# Patient Record
Sex: Male | Born: 1955
Health system: Southern US, Community
[De-identification: ages and names within clinical notes are randomized; demographics above are authoritative.]

## PROBLEM LIST (undated history)

## (undated) DIAGNOSIS — J9621 Acute and chronic respiratory failure with hypoxia: Secondary | ICD-10-CM

## (undated) DIAGNOSIS — E785 Hyperlipidemia, unspecified: Secondary | ICD-10-CM

## (undated) DIAGNOSIS — C9111 Chronic lymphocytic leukemia of B-cell type in remission: Secondary | ICD-10-CM

## (undated) DIAGNOSIS — F17200 Nicotine dependence, unspecified, uncomplicated: Secondary | ICD-10-CM

## (undated) DIAGNOSIS — K219 Gastro-esophageal reflux disease without esophagitis: Secondary | ICD-10-CM

## (undated) DIAGNOSIS — G4700 Insomnia, unspecified: Secondary | ICD-10-CM

## (undated) DIAGNOSIS — F419 Anxiety disorder, unspecified: Secondary | ICD-10-CM

## (undated) DIAGNOSIS — J449 Chronic obstructive pulmonary disease, unspecified: Secondary | ICD-10-CM

## (undated) DIAGNOSIS — I10 Essential (primary) hypertension: Secondary | ICD-10-CM

## (undated) DIAGNOSIS — R319 Hematuria, unspecified: Secondary | ICD-10-CM

## (undated) DIAGNOSIS — N200 Calculus of kidney: Secondary | ICD-10-CM

## (undated) DIAGNOSIS — J189 Pneumonia, unspecified organism: Secondary | ICD-10-CM

## (undated) HISTORY — DX: Hyperlipidemia, unspecified: E78.5

## (undated) HISTORY — DX: Gastro-esophageal reflux disease without esophagitis: K21.9

## (undated) HISTORY — DX: Insomnia, unspecified: G47.00

## (undated) HISTORY — DX: Hematuria, unspecified: R31.9

## (undated) HISTORY — PX: LITHOTRIPSY: SUR834

## (undated) HISTORY — DX: Calculus of kidney: N20.0

## (undated) HISTORY — DX: Nicotine dependence, unspecified, uncomplicated: F17.200

## (undated) HISTORY — DX: Chronic obstructive pulmonary disease, unspecified: J44.9

---

## 2005-02-19 ENCOUNTER — Encounter: Admission: RE | Admit: 2005-02-19 | Discharge: 2005-02-19 | Payer: Self-pay | Admitting: Emergency Medicine

## 2008-05-05 ENCOUNTER — Encounter: Admission: RE | Admit: 2008-05-05 | Discharge: 2008-05-05 | Payer: Self-pay | Admitting: Internal Medicine

## 2008-06-28 ENCOUNTER — Ambulatory Visit (HOSPITAL_COMMUNITY): Admission: RE | Admit: 2008-06-28 | Discharge: 2008-06-28 | Payer: Self-pay | Admitting: Urology

## 2010-12-17 ENCOUNTER — Encounter: Payer: Self-pay | Admitting: Emergency Medicine

## 2011-08-24 LAB — CBC
HCT: 46
Hemoglobin: 15.9
MCHC: 34.6
MCV: 89.2
Platelets: 380
RBC: 5.16
RDW: 12.8
WBC: 9.1

## 2011-08-24 LAB — BASIC METABOLIC PANEL
BUN: 9
CO2: 29
Calcium: 9
Chloride: 105
Creatinine, Ser: 0.93
GFR calc Af Amer: >60
GFR calc non Af Amer: >60
Glucose, Bld: 95
Potassium: 4.2
Sodium: 141

## 2011-12-31 ENCOUNTER — Ambulatory Visit (HOSPITAL_COMMUNITY)
Admission: RE | Admit: 2011-12-31 | Discharge: 2011-12-31 | Disposition: A | Discharge status: Home / Self Care | Payer: 59 | Source: Ambulatory Visit | Attending: Internal Medicine | Admitting: Internal Medicine

## 2011-12-31 DIAGNOSIS — R0989 Other specified symptoms and signs involving the circulatory and respiratory systems: Secondary | ICD-10-CM | POA: Insufficient documentation

## 2011-12-31 DIAGNOSIS — R0609 Other forms of dyspnea: Secondary | ICD-10-CM | POA: Insufficient documentation

## 2011-12-31 DIAGNOSIS — R062 Wheezing: Secondary | ICD-10-CM | POA: Insufficient documentation

## 2011-12-31 DIAGNOSIS — R0602 Shortness of breath: Secondary | ICD-10-CM | POA: Insufficient documentation

## 2011-12-31 DIAGNOSIS — R05 Cough: Secondary | ICD-10-CM | POA: Insufficient documentation

## 2011-12-31 DIAGNOSIS — J988 Other specified respiratory disorders: Secondary | ICD-10-CM | POA: Insufficient documentation

## 2011-12-31 DIAGNOSIS — R059 Cough, unspecified: Secondary | ICD-10-CM | POA: Insufficient documentation

## 2011-12-31 DIAGNOSIS — Z87891 Personal history of nicotine dependence: Secondary | ICD-10-CM | POA: Insufficient documentation

## 2011-12-31 MED ORDER — ALBUTEROL SULFATE (5 MG/ML) 0.5% IN NEBU
2.5000 mg | INHALATION_SOLUTION | Freq: Once | RESPIRATORY_TRACT | Status: AC
  Administered 2011-12-31: 2.5 mg via RESPIRATORY_TRACT

## 2012-04-24 ENCOUNTER — Encounter: Payer: Self-pay | Admitting: Internal Medicine

## 2012-04-25 ENCOUNTER — Ambulatory Visit (INDEPENDENT_AMBULATORY_CARE_PROVIDER_SITE_OTHER): Payer: 59 | Admitting: Internal Medicine

## 2012-04-25 ENCOUNTER — Encounter: Payer: Self-pay | Admitting: Internal Medicine

## 2012-04-25 VITALS — BP 128/80 | HR 86 | Temp 97.9°F | Ht 71.0 in | Wt 272.8 lb

## 2012-04-25 DIAGNOSIS — E669 Obesity, unspecified: Secondary | ICD-10-CM

## 2012-04-25 DIAGNOSIS — G4730 Sleep apnea, unspecified: Secondary | ICD-10-CM

## 2012-04-25 DIAGNOSIS — J4489 Other specified chronic obstructive pulmonary disease: Secondary | ICD-10-CM

## 2012-04-25 DIAGNOSIS — J449 Chronic obstructive pulmonary disease, unspecified: Secondary | ICD-10-CM

## 2012-04-25 MED ORDER — PREDNISONE 10 MG PO TABS: ORAL_TABLET | ORAL | Status: DC

## 2012-04-25 MED ORDER — DOXYCYCLINE HYCLATE 100 MG PO TABS: 100.0000 mg | ORAL_TABLET | Freq: Two times a day (BID) | ORAL | Status: AC

## 2012-04-25 NOTE — Assessment & Plan Note (Signed)
Definitely contributing factor to dyspnea esp recent post quiting smokin weight gain. He and wife interested in learning more about low glycemic diet. I will talk more about it during followup

## 2012-04-25 NOTE — Patient Instructions (Addendum)
#  COPD You might be having an acute flare of copd Take doxycycline 100mg  po twice daily x 8 days; take after meals and avoid sunlight Take prednisone 40 mg daily x 2 days, then 20mg  daily x 2 days, then 10mg  daily x 2 days, then 5mg  daily x 2 days and stop Continue tudorza twice daily Continue symbicort twice daily Nurse will walk you for oxygen levels We will refer you to pulmonary rehab At some point we will do alpha 1 genetic test for copd  #Posible sleep apnea We will set you up with referral to sleep doctor in practice  #Weight management  - when you return we can discuss diet in detail; wil discuss [facility] low glycemic diet. Wife and he very interested in hearing about this  #Followoup  3 weeks with spirometry and CAT Score

## 2012-04-25 NOTE — Progress Notes (Signed)
Subjective:    Patient ID: Isaac [name], male    DOB: 03-Aug-1956, 56 y.o.   MRN: [id number]  HPI  IOV 04/25/2012  PCP is [name], MD Body mass index is 38.05 kg/(m^2).  reports that he quit smoking about 4 months ago. His smoking use included Cigarettes. He has a 35 pack-year smoking history. He does not have any smokeless tobacco history on file.   Dyspnea several years., Progressive past several months but more so past one month. cOincides after quitting smoking followed by unspecified weight gain.. Class 3 dyspnea on exertion. Relieved by rest. No associated chst pain but has chest tightness. Denies associated cough, wheeze, orthopnea but does admit to associated classic sleep apnea symptoms, and chronic sinus congeston esp on left side. Reports sinus issues results in flares of acute bronchitis  Walk test in office : Pulse 88% at rest -> 1 lap x 185 feet - pulse ox 88% and HR 110 and very dyspneic, so stopped   CAT COPD Symptom and Quality of Life Score ([employer] trademark)  0 (no burden) to 5 (highest burden)  Never Cough -> Cough all the time 3  No phlegm in chest -> Chest is full of phlegm 4  No chest tightness -> Chest feels very tight 4  No dyspnea for 1 flight stairs/hill -> Very dyspneic for 1 flight of stairs 5  No limitations for ADL at home -> Very limited with ADL at home 5  Confident leaving home -> Not at all confident leaving home 3  Sleep soundly -> Do not sleep soundly because of lung condition 4  Lots of Energy -> No energy at all 4  TOTAL Score (max 40)  34      CT sinus 2006   - IMPRESSION:  1. Ethmoid and frontal sinus disease. Sphenoid and maxillary sinuses are clear.  2. Patent ostiomeatal complexes.  Spirometry 12/31/11 done at - fev1 0.8L/21%, Rati 39, 23% BD response on fev1  CXR 12/24/11   Clear lung fields - personally revieweed  Nuclear med stress test  2013 -  - negative per hx. Done by Dr Jacinto Halim per hx   Past Medical  History  Diagnosis Date  . Hyperlipidemia   . Tobacco dependence   . Nephrolithiasis   . Hematuria   . COPD (chronic obstructive pulmonary disease)   . GERD (gastroesophageal reflux disease)   . Insomnia      Family History  Problem Relation Age of Onset  . Heart disease Father   . Stroke Mother   . Leukemia Brother      History   Social History  . Marital Status: Married    Spouse Name: N/A    Number of Children: N/A  . Years of Education: N/A   Occupational History  . unemployed    Social History Main Topics  . Smoking status: Former Smoker -- 1.0 packs/day for 35 years    Types: Cigarettes    Quit date: 11/27/2011  . Smokeless tobacco: Not on file  . Alcohol Use: No  . Drug Use: No  . Sexually Active: Not on file   Other Topics Concern  . Not on file   Social History Narrative  . No narrative on file     Allergies  Allergen Reactions  . Codeine Itching     Outpatient Prescriptions Prior to Visit  Medication Sig Dispense Refill  . albuterol (PROVENTIL HFA;VENTOLIN HFA) 108 (90 BASE) MCG/ACT inhaler Inhale 2 puffs into the  lungs every 6 (six) hours as needed.      . budesonide-formoterol (SYMBICORT) 160-4.5 MCG/ACT inhaler Inhale 2 puffs into the lungs 2 (two) times daily.      . fish oil-omega-3 fatty acids 1000 MG capsule Take 2 g by mouth daily.      Marland Kitchen loratadine (CLARITIN) 10 MG tablet Take 10 mg by mouth daily.      Marland Kitchen losartan (COZAAR) 50 MG tablet Take 50 mg by mouth daily.      . montelukast (SINGULAIR) 10 MG tablet Take 10 mg by mouth at bedtime.      Marland Kitchen omeprazole (PRILOSEC) 20 MG capsule Take 20 mg by mouth daily.      Marland Kitchen FLUoxetine (PROZAC) 10 MG capsule Take 10 mg by mouth daily.      . hydrochlorothiazide (MICROZIDE) 12.5 MG capsule Take 12.5 mg by mouth daily.             Review of Systems  Constitutional: Negative for fever and unexpected weight change.  HENT: Positive for congestion. Negative for ear pain, nosebleeds, sore throat,  rhinorrhea, sneezing, trouble swallowing, dental problem, postnasal drip and sinus pressure.   Eyes: Negative for redness and itching.  Respiratory: Positive for shortness of breath. Negative for cough, chest tightness and wheezing.   Cardiovascular: Negative for palpitations and leg swelling.  Gastrointestinal: Negative for nausea and vomiting.  Genitourinary: Negative for dysuria.  Musculoskeletal: Negative for joint swelling.  Skin: Negative for rash.  Neurological: Negative for headaches.  Hematological: Does not bruise/bleed easily.  Psychiatric/Behavioral: Positive for dysphoric mood. The patient is not nervous/anxious.        Objective:   Physical Exam  Nursing note and vitals reviewed. Constitutional: He is oriented to person, place, and time. He appears well-developed and well-nourished. No distress.       Body mass index is 38.05 kg/(m^2). Obese  HENT:  Head: Normocephalic and atraumatic.  Right Ear: External ear normal.  Left Ear: External ear normal.  Mouth/Throat: Oropharynx is clear and moist. No oropharyngeal exudate.  Eyes: Conjunctivae and EOM are normal. Pupils are equal, round, and reactive to light. Right eye exhibits no discharge. Left eye exhibits no discharge. No scleral icterus.  Neck: Normal range of motion. Neck supple. No JVD present. No tracheal deviation present. No thyromegaly present.       Obese neck Mallampatti class 3-4  Cardiovascular: Normal rate, regular rhythm and intact distal pulses.  Exam reveals no gallop and no friction rub.   No murmur heard. Pulmonary/Chest: Effort normal and breath sounds normal. No respiratory distress. He has no wheezes. He has no rales. He exhibits no tenderness.       Pants due to dyspnea after walking  Abdominal: Soft. Bowel sounds are normal. He exhibits no distension and no mass. There is no tenderness. There is no rebound and no guarding.  Musculoskeletal: Normal range of motion. He exhibits no edema and no  tenderness.  Lymphadenopathy:    He has no cervical adenopathy.  Neurological: He is alert and oriented to person, place, and time. He has normal reflexes. No cranial nerve deficit. Coordination normal.  Skin: Skin is warm and dry. No rash noted. He is not diaphoretic. No erythema. No pallor.  Psychiatric: He has a normal mood and affect. His behavior is normal. Judgment and thought content normal.          Assessment & Plan:

## 2012-04-25 NOTE — Assessment & Plan Note (Addendum)
#  COPD - worsening dyspnea due to aecopd v weight gain v deconditoni v some combo of all 3 with baseline versy severe copd. Reportedly compliant with tudorza and symbicort per hx  PLAN You might be having an acute flare of copd Take doxycycline 100mg  po twice daily x 8 days; take after meals and avoid sunlight Take prednisone 40 mg daily x 2 days, then 20mg  daily x 2 days, then 10mg  daily x 2 days, then 5mg  daily x 2 days and stop Continue tudorza twice daily Continue symbicort twice daily Nurse will walk you for oxygen levels We will refer you to pulmonary rehab At some point we will do alpha 1 genetic test for copd

## 2012-04-25 NOTE — Assessment & Plan Note (Signed)
[facility]

## 2012-05-21 ENCOUNTER — Ambulatory Visit: Payer: 59 | Admitting: Internal Medicine

## 2012-07-04 ENCOUNTER — Inpatient Hospital Stay (HOSPITAL_COMMUNITY)
Admission: AD | Admit: 2012-07-04 | Discharge: 2012-07-09 | DRG: 189 | Disposition: A | Discharge status: Home / Self Care | Payer: 59 | Source: Ambulatory Visit | Attending: Internal Medicine | Admitting: Internal Medicine

## 2012-07-04 ENCOUNTER — Encounter: Payer: Self-pay | Admitting: Internal Medicine

## 2012-07-04 ENCOUNTER — Ambulatory Visit (INDEPENDENT_AMBULATORY_CARE_PROVIDER_SITE_OTHER): Payer: 59 | Admitting: Internal Medicine

## 2012-07-04 ENCOUNTER — Inpatient Hospital Stay (HOSPITAL_COMMUNITY): Payer: 59

## 2012-07-04 ENCOUNTER — Encounter (HOSPITAL_COMMUNITY): Payer: Self-pay | Admitting: *Deleted

## 2012-07-04 VITALS — BP 136/84 | HR 85 | Temp 97.2°F

## 2012-07-04 DIAGNOSIS — E669 Obesity, unspecified: Secondary | ICD-10-CM

## 2012-07-04 DIAGNOSIS — J9620 Acute and chronic respiratory failure, unspecified whether with hypoxia or hypercapnia: Principal | ICD-10-CM | POA: Diagnosis present

## 2012-07-04 DIAGNOSIS — J441 Chronic obstructive pulmonary disease with (acute) exacerbation: Secondary | ICD-10-CM

## 2012-07-04 DIAGNOSIS — R339 Retention of urine, unspecified: Secondary | ICD-10-CM | POA: Diagnosis present

## 2012-07-04 DIAGNOSIS — J449 Chronic obstructive pulmonary disease, unspecified: Secondary | ICD-10-CM

## 2012-07-04 DIAGNOSIS — I10 Essential (primary) hypertension: Secondary | ICD-10-CM

## 2012-07-04 DIAGNOSIS — G4700 Insomnia, unspecified: Secondary | ICD-10-CM | POA: Diagnosis present

## 2012-07-04 DIAGNOSIS — G4733 Obstructive sleep apnea (adult) (pediatric): Secondary | ICD-10-CM | POA: Diagnosis present

## 2012-07-04 DIAGNOSIS — K219 Gastro-esophageal reflux disease without esophagitis: Secondary | ICD-10-CM | POA: Diagnosis present

## 2012-07-04 DIAGNOSIS — N289 Disorder of kidney and ureter, unspecified: Secondary | ICD-10-CM

## 2012-07-04 DIAGNOSIS — F411 Generalized anxiety disorder: Secondary | ICD-10-CM | POA: Diagnosis present

## 2012-07-04 DIAGNOSIS — J329 Chronic sinusitis, unspecified: Secondary | ICD-10-CM | POA: Diagnosis present

## 2012-07-04 DIAGNOSIS — F17200 Nicotine dependence, unspecified, uncomplicated: Secondary | ICD-10-CM | POA: Diagnosis present

## 2012-07-04 DIAGNOSIS — G4730 Sleep apnea, unspecified: Secondary | ICD-10-CM

## 2012-07-04 DIAGNOSIS — J019 Acute sinusitis, unspecified: Secondary | ICD-10-CM | POA: Diagnosis present

## 2012-07-04 LAB — PROTIME-INR
INR: 0.94 (ref 0.00–1.49)
Prothrombin Time: 12.8 seconds (ref 11.6–15.2)

## 2012-07-04 LAB — CBC
HCT: 38.3 % — ABNORMAL LOW (ref 39.0–52.0)
Hemoglobin: 12.2 g/dL — ABNORMAL LOW (ref 13.0–17.0)
MCH: 26 pg (ref 26.0–34.0)
MCHC: 31.9 g/dL (ref 30.0–36.0)
MCV: 81.7 fL (ref 78.0–100.0)
Platelets: 375 10*3/uL (ref 150–400)
RBC: 4.69 MIL/uL (ref 4.22–5.81)
RDW: 15.4 % (ref 11.5–15.5)
WBC: 12.5 10*3/uL — ABNORMAL HIGH (ref 4.0–10.5)

## 2012-07-04 LAB — DIFFERENTIAL
Basophils Absolute: 0 10*3/uL (ref 0.0–0.1)
Basophils Relative: 0 % (ref 0–1)
Eosinophils Absolute: 0.3 10*3/uL (ref 0.0–0.7)
Eosinophils Relative: 2 % (ref 0–5)
Lymphocytes Relative: 22 % (ref 12–46)
Lymphs Abs: 2.8 10*3/uL (ref 0.7–4.0)
Monocytes Absolute: 1.6 10*3/uL — ABNORMAL HIGH (ref 0.1–1.0)
Monocytes Relative: 13 % — ABNORMAL HIGH (ref 3–12)
Neutro Abs: 7.8 10*3/uL — ABNORMAL HIGH (ref 1.7–7.7)
Neutrophils Relative %: 63 % (ref 43–77)

## 2012-07-04 LAB — LACTIC ACID, PLASMA: Lactic Acid, Venous: 0.8 mmol/L (ref 0.5–2.2)

## 2012-07-04 LAB — COMPREHENSIVE METABOLIC PANEL
ALT: 23 U/L (ref 0–53)
AST: 18 U/L (ref 0–37)
Albumin: 3.4 g/dL — ABNORMAL LOW (ref 3.5–5.2)
Alkaline Phosphatase: 86 U/L (ref 39–117)
BUN: 14 mg/dL (ref 6–23)
CO2: 29 mEq/L (ref 19–32)
Calcium: 9.2 mg/dL (ref 8.4–10.5)
Chloride: 100 mEq/L (ref 96–112)
Creatinine, Ser: 1.54 mg/dL — ABNORMAL HIGH (ref 0.50–1.35)
GFR calc Af Amer: 57 mL/min — ABNORMAL LOW (ref >90)
GFR calc non Af Amer: 49 mL/min — ABNORMAL LOW (ref >90)
Glucose, Bld: 94 mg/dL (ref 70–99)
Potassium: 4.3 mEq/L (ref 3.5–5.1)
Sodium: 138 mEq/L (ref 135–145)
Total Bilirubin: 0.3 mg/dL (ref 0.3–1.2)
Total Protein: 6.5 g/dL (ref 6.0–8.3)

## 2012-07-04 LAB — BLOOD GAS, ARTERIAL
Acid-Base Excess: 2.3 mmol/L — ABNORMAL HIGH (ref 0.0–2.0)
Bicarbonate: 27.4 mEq/L — ABNORMAL HIGH (ref 20.0–24.0)
Drawn by: 313061
O2 Content: 2 L/min
O2 Saturation: 97.4 %
Patient temperature: 98.6
TCO2: 24.7 mmol/L (ref 0–100)
pCO2 arterial: 47 mmHg — ABNORMAL HIGH (ref 35.0–45.0)
pH, Arterial: 7.384 (ref 7.350–7.450)
pO2, Arterial: 97.6 mmHg (ref 80.0–100.0)

## 2012-07-04 LAB — CARDIAC PANEL(CRET KIN+CKTOT+MB+TROPI)
CK, MB: 4 ng/mL (ref 0.3–4.0)
Relative Index: 2.6 — ABNORMAL HIGH (ref 0.0–2.5)
Total CK: 155 U/L (ref 7–232)
Troponin I: <0.3 ng/mL (ref <0.30)

## 2012-07-04 LAB — MAGNESIUM: Magnesium: 2 mg/dL (ref 1.5–2.5)

## 2012-07-04 LAB — PROCALCITONIN: Procalcitonin: <0.1 ng/mL

## 2012-07-04 LAB — PHOSPHORUS: Phosphorus: 3.6 mg/dL (ref 2.3–4.6)

## 2012-07-04 LAB — PRO B NATRIURETIC PEPTIDE: Pro B Natriuretic peptide (BNP): 28.5 pg/mL (ref 0–125)

## 2012-07-04 MED ORDER — LOSARTAN POTASSIUM 50 MG PO TABS
50.0000 mg | ORAL_TABLET | Freq: Every day | ORAL | Status: DC
  Administered 2012-07-05 – 2012-07-07 (×3): 50 mg via ORAL
  Filled 2012-07-04 (×3): qty 1

## 2012-07-04 MED ORDER — ASPIRIN 300 MG RE SUPP
300.0000 mg | RECTAL | Status: AC
  Filled 2012-07-04: qty 1

## 2012-07-04 MED ORDER — METHYLPREDNISOLONE SODIUM SUCC 125 MG IJ SOLR
60.0000 mg | Freq: Four times a day (QID) | INTRAMUSCULAR | Status: DC
  Administered 2012-07-04 – 2012-07-05 (×3): 60 mg via INTRAVENOUS
  Filled 2012-07-04 (×7): qty 0.96

## 2012-07-04 MED ORDER — PANTOPRAZOLE SODIUM 40 MG PO TBEC
40.0000 mg | DELAYED_RELEASE_TABLET | Freq: Every day | ORAL | Status: DC
  Filled 2012-07-04: qty 1

## 2012-07-04 MED ORDER — MOXIFLOXACIN HCL IN NACL 400 MG/250ML IV SOLN
400.0000 mg | INTRAVENOUS | Status: DC
  Administered 2012-07-04 – 2012-07-05 (×2): 400 mg via INTRAVENOUS
  Filled 2012-07-04 (×3): qty 250

## 2012-07-04 MED ORDER — IPRATROPIUM BROMIDE 0.02 % IN SOLN
RESPIRATORY_TRACT | Status: AC
  Administered 2012-07-04: 0.5 mg via RESPIRATORY_TRACT
  Filled 2012-07-04: qty 2.5

## 2012-07-04 MED ORDER — ALBUTEROL SULFATE (5 MG/ML) 0.5% IN NEBU
2.5000 mg | INHALATION_SOLUTION | RESPIRATORY_TRACT | Status: DC
  Administered 2012-07-04 – 2012-07-05 (×5): 2.5 mg via RESPIRATORY_TRACT
  Filled 2012-07-04 (×5): qty 0.5

## 2012-07-04 MED ORDER — OMEGA-3 FATTY ACIDS 1000 MG PO CAPS: 2.0000 g | ORAL_CAPSULE | Freq: Every day | ORAL | Status: DC

## 2012-07-04 MED ORDER — MONTELUKAST SODIUM 10 MG PO TABS
10.0000 mg | ORAL_TABLET | Freq: Every day | ORAL | Status: DC
  Administered 2012-07-04 – 2012-07-08 (×5): 10 mg via ORAL
  Filled 2012-07-04 (×6): qty 1

## 2012-07-04 MED ORDER — TRAZODONE HCL 50 MG PO TABS
50.0000 mg | ORAL_TABLET | Freq: Every evening | ORAL | Status: DC | PRN
  Administered 2012-07-08: 50 mg via ORAL
  Filled 2012-07-04: qty 1

## 2012-07-04 MED ORDER — HEPARIN SODIUM (PORCINE) 5000 UNIT/ML IJ SOLN
5000.0000 [IU] | Freq: Three times a day (TID) | INTRAMUSCULAR | Status: DC
  Administered 2012-07-04 – 2012-07-09 (×14): 5000 [IU] via SUBCUTANEOUS
  Filled 2012-07-04 (×17): qty 1

## 2012-07-04 MED ORDER — LEVALBUTEROL HCL 0.63 MG/3ML IN NEBU
0.6300 mg | INHALATION_SOLUTION | Freq: Once | RESPIRATORY_TRACT | Status: AC
  Administered 2012-07-04: 0.63 mg via RESPIRATORY_TRACT

## 2012-07-04 MED ORDER — ALBUTEROL SULFATE (5 MG/ML) 0.5% IN NEBU: 2.5000 mg | INHALATION_SOLUTION | RESPIRATORY_TRACT | Status: DC | PRN

## 2012-07-04 MED ORDER — SODIUM CHLORIDE 0.9 % IV SOLN: 250.0000 mL | INTRAVENOUS | Status: DC | PRN

## 2012-07-04 MED ORDER — ASPIRIN 81 MG PO CHEW: 324.0000 mg | CHEWABLE_TABLET | ORAL | Status: AC

## 2012-07-04 MED ORDER — OMEGA-3-ACID ETHYL ESTERS 1 G PO CAPS
2.0000 g | ORAL_CAPSULE | Freq: Every day | ORAL | Status: DC
  Filled 2012-07-04 (×2): qty 2

## 2012-07-04 MED ORDER — FLUOXETINE HCL 10 MG PO CAPS
10.0000 mg | ORAL_CAPSULE | Freq: Every day | ORAL | Status: DC
  Administered 2012-07-04 – 2012-07-08 (×5): 10 mg via ORAL
  Filled 2012-07-04 (×6): qty 1

## 2012-07-04 MED ORDER — IPRATROPIUM BROMIDE 0.02 % IN SOLN
0.5000 mg | RESPIRATORY_TRACT | Status: DC
  Administered 2012-07-04 – 2012-07-05 (×5): 0.5 mg via RESPIRATORY_TRACT
  Filled 2012-07-04 (×4): qty 2.5

## 2012-07-04 NOTE — Progress Notes (Signed)
Subjective:    Patient ID: Isaac [name], male    DOB: 1956-10-13, 56 y.o.   MRN: [id number]  HPI IOV 04/25/2012  PCP is [name], MD Body mass index is 38.05 kg/(m^2).  reports that he quit smoking about 4 months ago. His smoking use included Cigarettes. He has a 35 pack-year smoking history. He does not have any smokeless tobacco history on file.   Dyspnea several years., Progressive past several months but more so past one month. cOincides after quitting smoking followed by unspecified weight gain.. Class 3 dyspnea on exertion. Relieved by rest. No associated chst pain but has chest tightness. Denies associated cough, wheeze, orthopnea but does admit to associated classic sleep apnea symptoms, and chronic sinus congeston esp on left side. Reports sinus issues results in flares of acute bronchitis  Walk test in office : Pulse 88% at rest -> 1 lap x 185 feet - pulse ox 88% and HR 110 and very dyspneic, so stopped  CAT score - 34  CT sinus 2006   - IMPRESSION:  1. Ethmoid and frontal sinus disease. Sphenoid and maxillary sinuses are clear.  2. Patent ostiomeatal complexes.  Spirometry 12/31/11 done at [place]- fev1 0.8L/21%, Rati 39, 23% BD response on fev1  CXR 12/24/11   Clear lung fields - personally revieweed  Nuclear med stress test  2013 -  - negative per hx. Done by Dr Jacinto Halim per hx   REC #COPD You might be having an acute flare of copd Take doxycycline 100mg  po twice daily x 8 days; take after meals and avoid sunlight Take prednisone 40 mg daily x 2 days, then 20mg  daily x 2 days, then 10mg  daily x 2 days, then 5mg  daily x 2 days and stop Continue tudorza twice daily Continue symbicort twice daily Nurse will walk you for oxygen levels We will refer you to pulmonary rehab At some point we will do alpha 1 genetic test for copd  #Posible sleep apnea We will set you up with referral to sleep doctor in practice  #Weight management  - when you return we can discuss diet  in detail; wil discuss [facility] low glycemic diet. Wife and he very interested in hearing about this  #Followoup  3 weeks with spirometry and CAT Score    OV 07/04/2012      CAT COPD Symptom and Quality of Life Score ([employer] trademark)  0 (no burden) to 5 (highest burden) 04/25/12 07/04/2012   Never Cough -> Cough all the time 3 3  No phlegm in chest -> Chest is full of phlegm 4 3  No chest tightness -> Chest feels very tight 4 5  No dyspnea for 1 flight stairs/hill -> Very dyspneic for 1 flight of stairs 5 5  No limitations for ADL at home -> Very limited with ADL at home 5 5  Confident leaving home -> Not at all confident leaving home 3 5  Sleep soundly -> Do not sleep soundly because of lung condition 4 5  Lots of Energy -> No energy at all 4 5  TOTAL Score (max 40)  34 36     Review of Systems  Constitutional: Negative for fever and unexpected weight change.  HENT: Positive for ear pain, congestion, rhinorrhea and sinus pressure. Negative for nosebleeds, sore throat, sneezing, trouble swallowing and dental problem.   Eyes: Negative for redness and itching.  Respiratory: Positive for cough, chest tightness and shortness of breath. Negative for wheezing.   Cardiovascular: Positive  for leg swelling. Negative for palpitations.  Gastrointestinal: Negative for nausea and vomiting.  Genitourinary: Negative for dysuria.  Musculoskeletal: Negative for joint swelling.  Skin: Negative for rash.  Neurological: Negative for headaches.  Hematological: Does not bruise/bleed easily.  Psychiatric/Behavioral: Negative for dysphoric mood. The patient is not nervous/anxious.        Objective:   Physical Exam  Physical Exam  Nursing note and vitals reviewed. Constitutional: He is oriented to person, place, and time. He appears well-developed and well-nourished. No distress.       Body mass index is 38.05 kg/(m^2). Obese  HENT:  Head: Normocephalic and atraumatic.  Right Ear:  External ear normal.  Left Ear: External ear normal.  Mouth/Throat: Oropharynx is clear and moist. No oropharyngeal exudate.  Eyes: Conjunctivae and EOM are normal. Pupils are equal, round, and reactive to light. Right eye exhibits no discharge. Left eye exhibits no discharge. No scleral icterus.  Neck: Normal range of motion. Neck supple. No JVD present. No tracheal deviation present. No thyromegaly present.       Obese neck Mallampatti class 3-4  Cardiovascular: Normal rate, regular rhythm and intact distal pulses.  Exam reveals no gallop and no friction rub.   No murmur heard. Pulmonary/Chest: Effort normal and breath sounds normal. No respiratory distress. He has no wheezes. He has no rales. He exhibits no tenderness.       Pants due to dyspnea after walking  Abdominal: Soft. Bowel sounds are normal. He exhibits no distension and no mass. There is no tenderness. There is no rebound and no guarding.  Musculoskeletal: Normal range of motion. He exhibits no edema and no tenderness.  Lymphadenopathy:    He has no cervical adenopathy.  Neurological: He is alert and oriented to person, place, and time. He has normal reflexes. No cranial nerve deficit. Coordination normal.  Skin: Skin is warm and dry. No rash noted. He is not diaphoretic. No erythema. No pallor.  Psychiatric: He has a normal mood and affect. His behavior is normal. Judgment and thought content normal.            Assessment & Plan:

## 2012-07-04 NOTE — Progress Notes (Signed)
ANTIBIOTIC CONSULT NOTE - INITIAL  Pharmacy Consult for Avelox Indication:   Allergies  Allergen Reactions  . Codeine Itching    Patient Measurements: Height: 5\' 11"  (180.3 cm) Weight: 268 lb (121.564 kg) IBW/kg (Calculated) : 75.3  Adjusted Body Weight:   Vital Signs: Temp: 98.1 F (36.7 C) (08/09 1549) Temp src: Oral (08/09 1549) BP: 149/92 mmHg (08/09 1549) Pulse Rate: 90  (08/09 1549) Intake/Output from previous day:   Intake/Output from this shift:    Labs: No results found for this basename: WBC:3,HGB:3,PLT:3,LABCREA:3,CREATININE:3 in the last 72 hours Estimated Creatinine Clearance: 117.7 ml/min (by C-G formula based on Cr of 0.93). No results found for this basename: VANCOTROUGH:2,VANCOPEAK:2,VANCORANDOM:2,GENTTROUGH:2,GENTPEAK:2,GENTRANDOM:2,TOBRATROUGH:2,TOBRAPEAK:2,TOBRARND:2,AMIKACINPEAK:2,AMIKACINTROU:2,AMIKACIN:2, in the last 72 hours   Microbiology: No results found for this or any previous visit (from the past 720 hour(s)).  Medical History: Past Medical History  Diagnosis Date  . Hyperlipidemia   . Tobacco dependence   . Nephrolithiasis   . Hematuria   . COPD (chronic obstructive pulmonary disease)   . GERD (gastroesophageal reflux disease)   . Insomnia     Medications:  Scheduled:    . albuterol  2.5 mg Nebulization Q4H  . aspirin  324 mg Oral NOW   Or  . aspirin  300 mg Rectal NOW  . FLUoxetine  10 mg Oral QHS  . heparin  5,000 Units Subcutaneous Q8H  . ipratropium  0.5 mg Nebulization Q4H  . losartan  50 mg Oral Daily  . methylPREDNISolone (SOLU-MEDROL) injection  60 mg Intravenous Q6H  . montelukast  10 mg Oral QHS  . omega-3 acid ethyl esters  2 g Oral Daily  . pantoprazole  40 mg Oral Q1200  . DISCONTD: fish oil-omega-3 fatty acids  2 g Oral Daily   Infusions:   PRN: sodium chloride, albuterol, traZODone Assessment: 56 yo M with history of severe COPD  Goal of Therapy:    Plan:  Begin Avelox 400mg  IV qd Will continue to  follow patient's progress. Loletta Specter 07/04/2012,4:35 PM

## 2012-07-04 NOTE — Patient Instructions (Addendum)
admitted

## 2012-07-04 NOTE — H&P (Signed)
Hospital Admission Note Date of admit: 07/04/2012  3:17 PM LOS 0 days  Date: 07/04/2012  Patient name: Isaac [name] Medical record number: [id number] Date of birth: Dec 07, 1955 Age: 56 y.o. Gender: male PCP: [name], MD  Medical Service: PCCM,MD  Brief Summary: AECOPD in GOld stage 4 copd patient  Anti-infectives:  Anti-infectives     Start     Dose/Rate Route Frequency Ordered Stop   07/04/12 1700   moxifloxacin (AVELOX) IVPB 400 mg        400 mg 250 mL/hr over 60 Minutes Intravenous Every 24 hours 07/04/12 1639             Best Practice/Protocols:  VTE Prophylaxis: Heparin (SQ) SUP proph: protonix      History of Present Illness:   HPI  56 year old Obese (BMI 38), 35 pack smoker diagnosed May 2013 with gold stage 4 copd (class 3 dyspnea, exertional hypoexmia of 185 feet, and CAT score 34). Also with hx of chronic sinus disease but negative nuclear med stress test 2013 (dr Jacinto Halim) . In addition he might have undiagnosed OSA.   HE was last seen Apr 25, 2012 and diagnosed with COPD and AECOPD.  He returns for office followup on 07/04/2012 and complained that 3 weeks earler diagnosed with left ear infection that gradually spread to sinuses and "down into lungs". Since then extremely dyspneic, worsening cough and wheeze but no sputum. Completed 10 day course of unknown antibiotic for ear infection several days ago but still no relief from dyspnea. Denies prednisone intake or chest pain. 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 In office, noticdd to be purse lip breathing and unable to complete sentences. This was unimproved despite xopneex nebs and so admitted reluctantly to [place] on 07/04/2012        Allergies: Codeine  Past Medical History  Diagnosis Date  . Hyperlipidemia   . Tobacco dependence   . Nephrolithiasis   . Hematuria   . COPD (chronic obstructive pulmonary disease)   . GERD (gastroesophageal reflux disease)    . Insomnia     Past Surgical History  Procedure Date  . Lithotripsy     Family History  Problem Relation Age of Onset  . Heart disease Father   . Stroke Mother   . Leukemia Brother     History   Social History  . Marital Status: Married    Spouse Name: N/A    Number of Children: N/A  . Years of Education: N/A   Occupational History  . unemployed    Social History Main Topics  . Smoking status: Former Smoker -- 1.0 packs/day for 35 years    Types: Cigarettes    Quit date: 11/27/2011  . Smokeless tobacco: Not on file  . Alcohol Use: No  . Drug Use: No  . Sexually Active: Yes   Other Topics Concern  . Not on file   Social History Narrative  . No narrative on file    Prescriptions prior to admission  Medication Sig Dispense Refill  . albuterol (PROVENTIL HFA;VENTOLIN HFA) 108 (90 BASE) MCG/ACT inhaler Inhale 2 puffs into the lungs every 6 (six) hours as needed.      . budesonide-formoterol (SYMBICORT) 160-4.5 MCG/ACT inhaler Inhale 2 puffs into the lungs 2 (two) times daily.      . fish oil-omega-3 fatty acids 1000 MG capsule Take 2 g by mouth daily.      Marland Kitchen losartan (COZAAR)  50 MG tablet Take 50 mg by mouth daily.      . montelukast (SINGULAIR) 10 MG tablet Take 10 mg by mouth at bedtime.      Marland Kitchen omeprazole (PRILOSEC) 20 MG capsule Take 20 mg by mouth daily as needed. For GERD.      Marland Kitchen terazosin (HYTRIN) 1 MG capsule Take 1 mg by mouth at bedtime.      . traZODone (DESYREL) 50 MG tablet Take 50 mg by mouth at bedtime as needed. For sleep.      . TUDORZA PRESSAIR 400 MCG/ACT AEPB Inhale 1 puff into the lungs Twice daily.          Review of Systems:    Review of Systems  Constitutional: Negative for fever and unexpected weight change.  HENT: Positive for ear pain, congestion, rhinorrhea and sinus pressure. Negative for nosebleeds, sore throat, sneezing, trouble swallowing and dental problem.  Eyes: Negative for redness and itching.  Respiratory: Positive  for cough, chest tightness and shortness of breath. Negative for wheezing.  Cardiovascular: Positive for leg swelling. Negative for palpitations.  Gastrointestinal: Negative for nausea and vomiting.  Genitourinary: Negative for dysuria.  Musculoskeletal: Negative for joint swelling.  Skin: Negative for rash.  Neurological: Negative for headaches.  Hematological: Does not bruise/bleed easily.  Psychiatric/Behavioral: Negative for dysphoric mood. The patient is not nervous/anxious.   Physical Exam: Physical Exam  Physical Exam  Nursing note and vitals reviewed.  Constitutional: He is oriented to person, place, and time. He appears well-developed and well-nourished. No distress.  Body mass index is 38.05 kg/(m^2). Obese  HENT:  Head: Normocephalic and atraumatic.  Right Ear: External ear normal.  Left Ear: External ear normal.  Mouth/Throat: Oropharynx is clear and moist. No oropharyngeal exudate. XANTELESMA + Eyes: Conjunctivae and EOM are normal. Pupils are equal, round, and reactive to light. Right eye exhibits no discharge. Left eye exhibits no discharge. No scleral icterus.  Neck: Normal range of motion. Neck supple. No JVD present. No tracheal deviation present. No thyromegaly present.  Obese neck Mallampatti class 3-4  Cardiovascular: Normal rate, regular rhythm and intact distal pulses. Exam reveals no gallop and no friction rub.  No murmur heard.  Pulmonary/Chest: Purse lip breathing +. Unable to complete sentences +. Prolonged expiration. No clear cut wheeze. Overall air entry diminished  Abdominal: Soft. Bowel sounds are normal. He exhibits no distension and no mass. There is no tenderness. There is no rebound and no guarding.  Musculoskeletal: Normal range of motion. He exhibits no edema and no tenderness.  Lymphadenopathy:  He has no cervical adenopathy.  Neurological: He is alert and oriented to person, place, and time. He has normal reflexes. No cranial nerve deficit.  Coordination normal.  Skin: Skin is warm and dry. No rash noted. He is not diaphoretic. No erythema. No pallor.  Psychiatric: He has a normal mood and affect. His behavior is normal. Judgment and thought content normal.       Filed Vitals:   07/04/12 1549 07/04/12 1648  BP: 149/92   Pulse: 90   Temp: 98.1 F (36.7 C)   TempSrc: Oral   Resp:  22  Height: 5\' 11"  (1.803 m)   Weight: 121.564 kg (268 lb)   SpO2: 93%     Labs Results for orders placed during the hospital encounter of 07/04/12 (from the past 48 hour(s))  COMPREHENSIVE METABOLIC PANEL     Status: Abnormal   Collection Time   07/04/12  4:00 PM  Component Value Range Comment   Sodium 138  135 - 145 mEq/L    Potassium 4.3  3.5 - 5.1 mEq/L    Chloride 100  96 - 112 mEq/L    CO2 29  19 - 32 mEq/L    Glucose, Bld 94  70 - 99 mg/dL    BUN 14  6 - 23 mg/dL    Creatinine, Ser 1.61 (*) 0.50 - 1.35 mg/dL    Calcium 9.2  8.4 - 09.6 mg/dL    Total Protein 6.5  6.0 - 8.3 g/dL    Albumin 3.4 (*) 3.5 - 5.2 g/dL    AST 18  0 - 37 U/L    ALT 23  0 - 53 U/L    Alkaline Phosphatase 86  39 - 117 U/L    Total Bilirubin 0.3  0.3 - 1.2 mg/dL    GFR calc non Af Amer 49 (*) >90 mL/min    GFR calc Af Amer 57 (*) >90 mL/min   MAGNESIUM     Status: Normal   Collection Time   07/04/12  4:00 PM      Component Value Range Comment   Magnesium 2.0  1.5 - 2.5 mg/dL   PHOSPHORUS     Status: Normal   Collection Time   07/04/12  4:00 PM      Component Value Range Comment   Phosphorus 3.6  2.3 - 4.6 mg/dL   CARDIAC PANEL(CRET KIN+CKTOT+MB+TROPI)     Status: Abnormal   Collection Time   07/04/12  4:00 PM      Component Value Range Comment   Total CK 155  7 - 232 U/L    CK, MB 4.0  0.3 - 4.0 ng/mL    Troponin I <0.30  <0.30 ng/mL    Relative Index 2.6 (*) 0.0 - 2.5   PRO B NATRIURETIC PEPTIDE     Status: Normal   Collection Time   07/04/12  4:00 PM      Component Value Range Comment   Pro B Natriuretic peptide (BNP) 28.5  0 - 125 pg/mL     CBC     Status: Abnormal (Preliminary result)   Collection Time   07/04/12  4:00 PM      Component Value Range Comment   WBC 5.5  4.0 - 10.5 K/uL    RBC 4.29  4.22 - 5.81 MIL/uL    Hemoglobin 11.4 (*) 13.0 - 17.0 g/dL    HCT 04.5 (*) 40.9 - 52.0 %    MCV 80.9  78.0 - 100.0 fL    MCH 26.6  26.0 - 34.0 pg    MCHC 32.9  30.0 - 36.0 g/dL    RDW 81.1 (*) 91.4 - 15.5 %    Platelets PENDING  150 - 400 K/uL   PROTIME-INR     Status: Normal   Collection Time   07/04/12  4:00 PM      Component Value Range Comment   Prothrombin Time 12.8  11.6 - 15.2 seconds    INR 0.94  0.00 - 1.49   BLOOD GAS, ARTERIAL     Status: Abnormal   Collection Time   07/04/12  4:16 PM      Component Value Range Comment   O2 Content 2.0      Delivery systems NASAL CANNULA      pH, Arterial 7.384  7.350 - 7.450    pCO2 arterial 47.0 (*) 35.0 - 45.0 mmHg    pO2, Arterial 97.6  80.0 - 100.0 mmHg    Bicarbonate 27.4 (*) 20.0 - 24.0 mEq/L    TCO2 24.7  0 - 100 mmol/L    Acid-Base Excess 2.3 (*) 0.0 - 2.0 mmol/L    O2 Saturation 97.4      Patient temperature 98.6      Collection site RIGHT RADIAL      Drawn by [id number]      Sample type ARTERIAL DRAW      Allens test (pass/fail) PASS  PASS   LACTIC ACID, PLASMA     Status: Normal   Collection Time   07/04/12  5:04 PM      Component Value Range Comment   Lactic Acid, Venous 0.8  0.5 - 2.2 mmol/L     Imaging results:  Portable Chest Xray  07/04/2012  *RADIOLOGY REPORT*  Clinical Data: COPD exacerbation  PORTABLE CHEST - 1 VIEW  Comparison: 12/24/2011  Findings: Heart size and mediastinal contours are normal.  No pleural effusion or edema identified.  No airspace consolidation identified.  Mild chronic bronchitic changes noted.  IMPRESSION:  1.  No acute cardiopulmonary abnormalities.  Original Report Authenticated By: Rosealee Albee, M.D.     Assessment & Plan  Patient Active Hospital Problem List: COPD, severe (04/25/2012)   Assessment: BAseline Gold stage 4 copd  based on feb 2013 pft   Plan: Encourage to quit smoking  COPD exacerbation (07/04/2012)   Assessment: AECOPD   Plan: Floor admit, nebs, avelox, o2, steroids  Sleep apnea (04/25/2012)   Assessment: Does not wear cpap   Plan: monitor  Tobacco Abuse   Plan: Needs to quit  Wife updated  Dr. Kalman Shan, M.D., [facility].C.P Pulmonary and Critical Care Medicine Staff Physician  [facility] Pager: [phone], If no answer or between  15:00h - 7:00h: call [phone]  07/04/2012 5:57 PM

## 2012-07-05 LAB — CARDIAC PANEL(CRET KIN+CKTOT+MB+TROPI)
CK, MB: 4.1 ng/mL — ABNORMAL HIGH (ref 0.3–4.0)
CK, MB: 4.5 ng/mL — ABNORMAL HIGH (ref 0.3–4.0)
Relative Index: 2.6 — ABNORMAL HIGH (ref 0.0–2.5)
Relative Index: 3.5 — ABNORMAL HIGH (ref 0.0–2.5)
Total CK: 130 U/L (ref 7–232)
Total CK: 156 U/L (ref 7–232)
Troponin I: <0.3 ng/mL (ref <0.30)
Troponin I: <0.3 ng/mL (ref <0.30)

## 2012-07-05 MED ORDER — CLONIDINE HCL 0.1 MG PO TABS
0.1000 mg | ORAL_TABLET | Freq: Three times a day (TID) | ORAL | Status: DC
  Administered 2012-07-05 – 2012-07-07 (×7): 0.1 mg via ORAL
  Filled 2012-07-05 (×9): qty 1

## 2012-07-05 MED ORDER — HYDRALAZINE HCL 20 MG/ML IJ SOLN
10.0000 mg | Freq: Four times a day (QID) | INTRAMUSCULAR | Status: DC | PRN
  Administered 2012-07-05 – 2012-07-09 (×3): 10 mg via INTRAVENOUS
  Filled 2012-07-05 (×4): qty 1

## 2012-07-05 MED ORDER — METHYLPREDNISOLONE SODIUM SUCC 125 MG IJ SOLR
80.0000 mg | Freq: Three times a day (TID) | INTRAMUSCULAR | Status: DC
  Administered 2012-07-05 – 2012-07-08 (×9): 80 mg via INTRAVENOUS
  Filled 2012-07-05 (×12): qty 1.28

## 2012-07-05 MED ORDER — PANTOPRAZOLE SODIUM 40 MG PO TBEC
40.0000 mg | DELAYED_RELEASE_TABLET | Freq: Two times a day (BID) | ORAL | Status: DC
  Administered 2012-07-05 – 2012-07-09 (×8): 40 mg via ORAL
  Filled 2012-07-05 (×11): qty 1

## 2012-07-05 MED ORDER — ALBUTEROL SULFATE (5 MG/ML) 0.5% IN NEBU
2.5000 mg | INHALATION_SOLUTION | Freq: Four times a day (QID) | RESPIRATORY_TRACT | Status: DC
  Administered 2012-07-05 – 2012-07-08 (×12): 2.5 mg via RESPIRATORY_TRACT
  Filled 2012-07-05 (×12): qty 0.5

## 2012-07-05 MED ORDER — IPRATROPIUM BROMIDE 0.02 % IN SOLN
0.5000 mg | Freq: Four times a day (QID) | RESPIRATORY_TRACT | Status: DC
  Administered 2012-07-05 – 2012-07-08 (×12): 0.5 mg via RESPIRATORY_TRACT
  Filled 2012-07-05 (×12): qty 2.5

## 2012-07-05 NOTE — H&P (Deleted)
Hospital Admission Note Date of admit: 07/04/2012  3:17 PM LOS 1 days  Date: 07/05/2012  Patient name: Isaac [name] Medical record number: [id number] Date of birth: 11/01/1956 Age: 56 y.o. Gender: male PCP: KIM, JAMES, MD  Medical Service: PCCM,MD  Brief Summary: AECOPD in GOld stage 4 copd patient   Best Practice/Protocols:  VTE Prophylaxis: Heparin (SQ) SUP proph: protonix      History of Present Illness:   HPI  56 year old Obese (BMI 38), 35 pack smoker diagnosed May 2013 with gold stage 4 copd (class 3 dyspnea, exertional hypoexmia of 185 feet, and CAT score 34). Also with hx of chronic sinus disease but negative nuclear med stress test 2013 (dr Ganji) . In addition he might have undiagnosed OSA.   HE was last seen Apr 25, 2012 and diagnosed with COPD and AECOPD.  He returns for office followup on 07/04/2012 and complained that 3 weeks earler diagnosed with left ear infection that gradually spread to sinuses and "down into lungs". Since then extremely dyspneic, worsening cough and wheeze but no sputum. Completed 10 day course of unknown antibiotic for ear infection several days ago but still no relief from dyspnea. Denies prednisone intake or chest pain. 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 In office, notedto be purse lip breathing and unable to complete sentences. This was unimproved despite xopneex nebs and so admitted reluctantly to [place] on 07/04/2012  Events since admit:   Current status Sitting on side of bed c/o sinus and chest congestion but no sob at rest    Physical Exam: Physical Exam  Mouth/Throat: Oropharynx is clear and moist. No oropharyngeal exudate. XANTELESMA + Eyes: Conjunctivae and EOM are normal. Pupils are equal, round, and reactive to light. Right eye exhibits no discharge. Left eye exhibits no discharge. No scleral icterus.  Neck: Normal range of motion. Neck supple. No JVD present. No tracheal  deviation present. No thyromegaly present.  Obese neck Mallampatti class 3-4  Cardiovascular: Normal rate, regular rhythm and intact distal pulses. Exam reveals no gallop and no friction rub.  No murmur heard.  Pulmonary/Chest:distant bs, no wheeze at all Abdominal: Soft. Bowel sounds are normal. He exhibits no distension and no mass. There is no tenderness. There is no rebound and no guarding.  Musculoskeletal: Normal range of motion. He exhibits no edema and no tenderness.  Lymphadenopathy:        Filed Vitals:   07/05/12 0700 07/05/12 0735 07/05/12 0852 07/05/12 0902  BP: 185/103 176/98  186/111  Pulse: 95     Temp: 97.4 F (36.3 C)     TempSrc: Oral     Resp: 20     Height:      Weight:      SpO2: 94%  96%     Labs Results for orders placed during the hospital encounter of 07/04/12 (from the past 48 hour(s))  COMPREHENSIVE METABOLIC PANEL     Status: Abnormal   Collection Time   07/04/12  4:00 PM      Component Value Range Comment   Sodium 138  135 - 145 mEq/L    Potassium 4.3  3.5 - 5.1 mEq/L    Chloride 100  96 - 112 mEq/L    CO2 29  19 - 32 mEq/L    Glucose, Bld 94  70 - 99 mg/dL    BUN 14  6 - 23 mg/dL    Creatinine, Ser 1.54 (*) 0.50 -   1.35 mg/dL    Calcium 9.2  8.4 - 10.5 mg/dL    Total Protein 6.5  6.0 - 8.3 g/dL    Albumin 3.4 (*) 3.5 - 5.2 g/dL    AST 18  0 - 37 U/L    ALT 23  0 - 53 U/L    Alkaline Phosphatase 86  39 - 117 U/L    Total Bilirubin 0.3  0.3 - 1.2 mg/dL    GFR calc non Af Amer 49 (*) >90 mL/min    GFR calc Af Amer 57 (*) >90 mL/min   MAGNESIUM     Status: Normal   Collection Time   07/04/12  4:00 PM      Component Value Range Comment   Magnesium 2.0  1.5 - 2.5 mg/dL   PHOSPHORUS     Status: Normal   Collection Time   07/04/12  4:00 PM      Component Value Range Comment   Phosphorus 3.6  2.3 - 4.6 mg/dL   CARDIAC PANEL(CRET KIN+CKTOT+MB+TROPI)     Status: Abnormal   Collection Time   07/04/12  4:00 PM      Component Value Range Comment     Total CK 155  7 - 232 U/L    CK, MB 4.0  0.3 - 4.0 ng/mL    Troponin I <0.30  <0.30 ng/mL    Relative Index 2.6 (*) 0.0 - 2.5   PRO B NATRIURETIC PEPTIDE     Status: Normal   Collection Time   07/04/12  4:00 PM      Component Value Range Comment   Pro B Natriuretic peptide (BNP) 28.5  0 - 125 pg/mL   PROCALCITONIN     Status: Normal   Collection Time   07/04/12  4:00 PM      Component Value Range Comment   Procalcitonin <0.10     PROTIME-INR     Status: Normal   Collection Time   07/04/12  4:00 PM      Component Value Range Comment   Prothrombin Time 12.8  11.6 - 15.2 seconds    INR 0.94  0.00 - 1.49   BLOOD GAS, ARTERIAL     Status: Abnormal   Collection Time   07/04/12  4:16 PM      Component Value Range Comment   O2 Content 2.0      Delivery systems NASAL CANNULA      pH, Arterial 7.384  7.350 - 7.450    pCO2 arterial 47.0 (*) 35.0 - 45.0 mmHg    pO2, Arterial 97.6  80.0 - 100.0 mmHg    Bicarbonate 27.4 (*) 20.0 - 24.0 mEq/L    TCO2 24.7  0 - 100 mmol/L    Acid-Base Excess 2.3 (*) 0.0 - 2.0 mmol/L    O2 Saturation 97.4      Patient temperature 98.6      Collection site RIGHT RADIAL      Drawn by [id number]      Sample type ARTERIAL DRAW      Allens test (pass/fail) PASS  PASS   LACTIC ACID, PLASMA     Status: Normal   Collection Time   07/04/12  5:04 PM      Component Value Range Comment   Lactic Acid, Venous 0.8  0.5 - 2.2 mmol/L   CBC     Status: Abnormal   Collection Time   07/04/12  6:13 PM      Component Value Range Comment     WBC 12.5 (*) 4.0 - 10.5 K/uL    RBC 4.69  4.22 - 5.81 MIL/uL    Hemoglobin 12.2 (*) 13.0 - 17.0 g/dL    HCT 38.3 (*) 39.0 - 52.0 %    MCV 81.7  78.0 - 100.0 fL    MCH 26.0  26.0 - 34.0 pg    MCHC 31.9  30.0 - 36.0 g/dL    RDW 15.4  11.5 - 15.5 %    Platelets 375  150 - 400 K/uL   DIFFERENTIAL     Status: Abnormal   Collection Time   07/04/12  6:13 PM      Component Value Range Comment   Neutrophils Relative 63  43 - 77 %    Lymphocytes  Relative 22  12 - 46 %    Monocytes Relative 13 (*) 3 - 12 %    Eosinophils Relative 2  0 - 5 %    Basophils Relative 0  0 - 1 %    Neutro Abs 7.8 (*) 1.7 - 7.7 K/uL    Lymphs Abs 2.8  0.7 - 4.0 K/uL    Monocytes Absolute 1.6 (*) 0.1 - 1.0 K/uL    Eosinophils Absolute 0.3  0.0 - 0.7 K/uL    Basophils Absolute 0.0  0.0 - 0.1 K/uL    WBC Morphology MILD LEFT SHIFT (1-5% METAS, OCC MYELO, OCC BANDS)     CARDIAC PANEL(CRET KIN+CKTOT+MB+TROPI)     Status: Abnormal   Collection Time   07/04/12 11:48 PM      Component Value Range Comment   Total CK 156  7 - 232 U/L    CK, MB 4.1 (*) 0.3 - 4.0 ng/mL    Troponin I <0.30  <0.30 ng/mL    Relative Index 2.6 (*) 0.0 - 2.5   CARDIAC PANEL(CRET KIN+CKTOT+MB+TROPI)     Status: Abnormal   Collection Time   07/05/12  7:25 AM      Component Value Range Comment   Total CK 130  7 - 232 U/L    CK, MB 4.5 (*) 0.3 - 4.0 ng/mL    Troponin I <0.30  <0.30 ng/mL    Relative Index 3.5 (*) 0.0 - 2.5     Imaging results:  Portable Chest Xray  07/04/2012  *RADIOLOGY REPORT*  Clinical Data: COPD exacerbation  PORTABLE CHEST - 1 VIEW  Comparison: 12/24/2011  Findings: Heart size and mediastinal contours are normal.  No pleural effusion or edema identified.  No airspace consolidation identified.  Mild chronic bronchitic changes noted.  IMPRESSION:  1.  No acute cardiopulmonary abnormalities.  Original Report Authenticated By: TAYLOR H. STROUD, M.D.     Assessment & Plan  Patient Active Hospital Problem List: COPD, severe (04/25/2012)   Assessment: BAseline Gold stage 4 copd based on feb 2013 pft   Plan: Encourage to quit smoking  COPD exacerbation (07/04/2012)   Assessment: AECOPD   Plan: Floor admit, nebs, avelox, o2, steroids  Sleep apnea (04/25/2012)   Assessment: Does not wear cpap   Plan: monitor  Tobacco Abuse   Plan: Needs to quit 8/10 reports he quit a year ago.  DDX of  difficult airways managment all start with A and  include Adherence, Ace  Inhibitors, Acid Reflux, Active Sinus Disease, Alpha 1 Antitripsin deficiency, Anxiety masquerading as Airways dz,  ABPA,  allergy(esp in young), Aspiration (esp in elderly), Adverse effects of DPI,  Active smokers, plus two Bs  = Bronchiectasis and Beta blocker use..and one C= CHF   a  ? Acid reflux > rx plus stop fish oil, max ppi  ? Acute sinusitis > sinus ct at some point but not needed now  ? chf > ruled out with bnp <<100  HBP  rx clonidine, should help with anxiety also    Nonnie Pickney, MD Pulmonary and Critical Care Medicine  [facility] [phone]        

## 2012-07-05 NOTE — Progress Notes (Signed)
Attending notified in reference to patients elevated blood pressure. He has no documented history and states not ever being told that he had hypertension. New order given.

## 2012-07-05 NOTE — Progress Notes (Signed)
Hospital Admission Note Date of admit: 07/04/2012  3:17 PM LOS 1 days  Date: 07/05/2012  Patient name: Isaac [name] Medical record number: [id number] Date of birth: Jan 31, 1956 Age: 56 y.o. Gender: male PCP: [name], MD  Medical Service: PCCM,MD  Brief Summary: AECOPD in [place] stage 4 copd patient   Best Practice/Protocols:  VTE Prophylaxis: Heparin (SQ) SUP proph: protonix      History of Present Illness:   HPI  56 year old Obese (BMI 38), 35 pack smoker diagnosed May 2013 with gold stage 4 copd (class 3 dyspnea, exertional hypoexmia of 185 feet, and CAT score 34). Also with hx of chronic sinus disease but negative nuclear med stress test 2013 (dr Jacinto Halim) . In addition he might have undiagnosed OSA.   HE was last seen Apr 25, 2012 and diagnosed with COPD and AECOPD.  He returns for office followup on 07/04/2012 and complained that 3 weeks earler diagnosed with left ear infection that gradually spread to sinuses and "down into lungs". Since then extremely dyspneic, worsening cough and wheeze but no sputum. Completed 10 day course of unknown antibiotic for ear infection several days ago but still no relief from dyspnea. Denies prednisone intake or chest pain. 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 In office, notedto be purse lip breathing and unable to complete sentences. This was unimproved despite xopneex nebs and so admitted reluctantly to [facility] on 07/04/2012  Events since admit:   Current status Sitting on side of bed c/o sinus and chest congestion but no sob at rest    Physical Exam: Physical Exam  Mouth/Throat: Oropharynx is clear and moist. No oropharyngeal exudate. XANTELESMA + Eyes: Conjunctivae and EOM are normal. Pupils are equal, round, and reactive to light. Right eye exhibits no discharge. Left eye exhibits no discharge. No scleral icterus.  Neck: Normal range of motion. Neck supple. No JVD present. No tracheal  deviation present. No thyromegaly present.  Obese neck Mallampatti class 3-4  Cardiovascular: Normal rate, regular rhythm and intact distal pulses. Exam reveals no gallop and no friction rub.  No murmur heard.  Pulmonary/Chest:distant bs, no wheeze at all Abdominal: Soft. Bowel sounds are normal. He exhibits no distension and no mass. There is no tenderness. There is no rebound and no guarding.  Musculoskeletal: Normal range of motion. He exhibits no edema and no tenderness.  Lymphadenopathy:        Filed Vitals:   07/05/12 0700 07/05/12 0735 07/05/12 0852 07/05/12 0902  BP: 185/103 176/98  186/111  Pulse: 95     Temp: 97.4 F (36.3 C)     TempSrc: Oral     Resp: 20     Height:      Weight:      SpO2: 94%  96%     Labs Results for orders placed during the hospital encounter of 07/04/12 (from the past 48 hour(s))  COMPREHENSIVE METABOLIC PANEL     Status: Abnormal   Collection Time   07/04/12  4:00 PM      Component Value Range Comment   Sodium 138  135 - 145 mEq/L    Potassium 4.3  3.5 - 5.1 mEq/L    Chloride 100  96 - 112 mEq/L    CO2 29  19 - 32 mEq/L    Glucose, Bld 94  70 - 99 mg/dL    BUN 14  6 - 23 mg/dL    Creatinine, Ser 4.69 (*) 0.50 -  1.35 mg/dL    Calcium 9.2  8.4 - 16.1 mg/dL    Total Protein 6.5  6.0 - 8.3 g/dL    Albumin 3.4 (*) 3.5 - 5.2 g/dL    AST 18  0 - 37 U/L    ALT 23  0 - 53 U/L    Alkaline Phosphatase 86  39 - 117 U/L    Total Bilirubin 0.3  0.3 - 1.2 mg/dL    GFR calc non Af Amer 49 (*) >90 mL/min    GFR calc Af Amer 57 (*) >90 mL/min   MAGNESIUM     Status: Normal   Collection Time   07/04/12  4:00 PM      Component Value Range Comment   Magnesium 2.0  1.5 - 2.5 mg/dL   PHOSPHORUS     Status: Normal   Collection Time   07/04/12  4:00 PM      Component Value Range Comment   Phosphorus 3.6  2.3 - 4.6 mg/dL   CARDIAC PANEL(CRET KIN+CKTOT+MB+TROPI)     Status: Abnormal   Collection Time   07/04/12  4:00 PM      Component Value Range Comment     Total CK 155  7 - 232 U/L    CK, MB 4.0  0.3 - 4.0 ng/mL    Troponin I <0.30  <0.30 ng/mL    Relative Index 2.6 (*) 0.0 - 2.5   PRO B NATRIURETIC PEPTIDE     Status: Normal   Collection Time   07/04/12  4:00 PM      Component Value Range Comment   Pro B Natriuretic peptide (BNP) 28.5  0 - 125 pg/mL   PROCALCITONIN     Status: Normal   Collection Time   07/04/12  4:00 PM      Component Value Range Comment   Procalcitonin <0.10     PROTIME-INR     Status: Normal   Collection Time   07/04/12  4:00 PM      Component Value Range Comment   Prothrombin Time 12.8  11.6 - 15.2 seconds    INR 0.94  0.00 - 1.49   BLOOD GAS, ARTERIAL     Status: Abnormal   Collection Time   07/04/12  4:16 PM      Component Value Range Comment   O2 Content 2.0      Delivery systems NASAL CANNULA      pH, Arterial 7.384  7.350 - 7.450    pCO2 arterial 47.0 (*) 35.0 - 45.0 mmHg    pO2, Arterial 97.6  80.0 - 100.0 mmHg    Bicarbonate 27.4 (*) 20.0 - 24.0 mEq/L    TCO2 24.7  0 - 100 mmol/L    Acid-Base Excess 2.3 (*) 0.0 - 2.0 mmol/L    O2 Saturation 97.4      Patient temperature 98.6      Collection site RIGHT RADIAL      Drawn by [id number]      Sample type ARTERIAL DRAW      Allens test (pass/fail) PASS  PASS   LACTIC ACID, PLASMA     Status: Normal   Collection Time   07/04/12  5:04 PM      Component Value Range Comment   Lactic Acid, Venous 0.8  0.5 - 2.2 mmol/L   CBC     Status: Abnormal   Collection Time   07/04/12  6:13 PM      Component Value Range Comment  WBC 12.5 (*) 4.0 - 10.5 K/uL    RBC 4.69  4.22 - 5.81 MIL/uL    Hemoglobin 12.2 (*) 13.0 - 17.0 g/dL    HCT 16.1 (*) 09.6 - 52.0 %    MCV 81.7  78.0 - 100.0 fL    MCH 26.0  26.0 - 34.0 pg    MCHC 31.9  30.0 - 36.0 g/dL    RDW 04.5  40.9 - 81.1 %    Platelets 375  150 - 400 K/uL   DIFFERENTIAL     Status: Abnormal   Collection Time   07/04/12  6:13 PM      Component Value Range Comment   Neutrophils Relative 63  43 - 77 %    Lymphocytes  Relative 22  12 - 46 %    Monocytes Relative 13 (*) 3 - 12 %    Eosinophils Relative 2  0 - 5 %    Basophils Relative 0  0 - 1 %    Neutro Abs 7.8 (*) 1.7 - 7.7 K/uL    Lymphs Abs 2.8  0.7 - 4.0 K/uL    Monocytes Absolute 1.6 (*) 0.1 - 1.0 K/uL    Eosinophils Absolute 0.3  0.0 - 0.7 K/uL    Basophils Absolute 0.0  0.0 - 0.1 K/uL    WBC Morphology MILD LEFT SHIFT (1-5% METAS, OCC MYELO, OCC BANDS)     CARDIAC PANEL(CRET KIN+CKTOT+MB+TROPI)     Status: Abnormal   Collection Time   07/04/12 11:48 PM      Component Value Range Comment   Total CK 156  7 - 232 U/L    CK, MB 4.1 (*) 0.3 - 4.0 ng/mL    Troponin I <0.30  <0.30 ng/mL    Relative Index 2.6 (*) 0.0 - 2.5   CARDIAC PANEL(CRET KIN+CKTOT+MB+TROPI)     Status: Abnormal   Collection Time   07/05/12  7:25 AM      Component Value Range Comment   Total CK 130  7 - 232 U/L    CK, MB 4.5 (*) 0.3 - 4.0 ng/mL    Troponin I <0.30  <0.30 ng/mL    Relative Index 3.5 (*) 0.0 - 2.5     Imaging results:  Portable Chest Xray  07/04/2012  *RADIOLOGY REPORT*  Clinical Data: COPD exacerbation  PORTABLE CHEST - 1 VIEW  Comparison: 12/24/2011  Findings: Heart size and mediastinal contours are normal.  No pleural effusion or edema identified.  No airspace consolidation identified.  Mild chronic bronchitic changes noted.  IMPRESSION:  1.  No acute cardiopulmonary abnormalities.  Original Report Authenticated By: Rosealee Albee, M.D.     Assessment & Plan  Patient Active Hospital Problem List: COPD, severe (04/25/2012)   Assessment: BAseline Gold stage 4 copd based on feb 2013 pft   Plan: Encourage to quit smoking  COPD exacerbation (07/04/2012)   Assessment: AECOPD   Plan: Floor admit, nebs, avelox, o2, steroids  Sleep apnea (04/25/2012)   Assessment: Does not wear cpap   Plan: monitor  Tobacco Abuse   Plan: Needs to quit 8/10 reports he quit a year ago.  DDX of  difficult airways managment all start with A and  include Adherence, Ace  Inhibitors, Acid Reflux, Active Sinus Disease, Alpha 1 Antitripsin deficiency, Anxiety masquerading as Airways dz,  ABPA,  allergy(esp in young), Aspiration (esp in elderly), Adverse effects of DPI,  Active smokers, plus two Bs  = Bronchiectasis and Beta blocker use..and one C= CHF  a  ? Acid reflux > rx plus stop fish oil, max ppi  ? Acute sinusitis > sinus ct at some point but not needed now  ? chf > ruled out with bnp <<100  HBP  rx clonidine, should help with anxiety also    Sandrea Hughs, MD Pulmonary and Critical Care Medicine [facility] Cell [phone]

## 2012-07-06 ENCOUNTER — Inpatient Hospital Stay (HOSPITAL_COMMUNITY): Payer: 59

## 2012-07-06 MED ORDER — GUAIFENESIN ER 600 MG PO TB12
600.0000 mg | ORAL_TABLET | Freq: Two times a day (BID) | ORAL | Status: DC
  Administered 2012-07-06 – 2012-07-07 (×2): 600 mg via ORAL
  Filled 2012-07-06 (×3): qty 1

## 2012-07-06 MED ORDER — VITAMINS A & D EX OINT
TOPICAL_OINTMENT | CUTANEOUS | Status: AC
  Filled 2012-07-06: qty 5

## 2012-07-06 MED ORDER — MOXIFLOXACIN HCL 400 MG PO TABS
400.0000 mg | ORAL_TABLET | Freq: Every day | ORAL | Status: DC
  Administered 2012-07-06 – 2012-07-08 (×3): 400 mg via ORAL
  Filled 2012-07-06 (×4): qty 1

## 2012-07-06 NOTE — Progress Notes (Signed)
Hospital Admission Note Date of admit: 07/04/2012  3:17 PM LOS 2 days  Date: 07/06/2012  Patient name: Isaac [name] Medical record number: [id number] Date of birth: 12/29/1955 Age: 56 y.o. Gender: male PCP: Isaac Grippe, MD  Medical Service: PCCM,MD  Brief Summary: AECOPD in [place] stage 4 copd patient   Best Practice/Protocols:  VTE Prophylaxis: Heparin (SQ) SUP proph: protonix      History of Present Illness:   HPI  57 year old Obese (BMI 38), 35 pack smoker diagnosed May 2013 with gold stage 4 copd (class 3 dyspnea, exertional hypoexmia of 185 feet, and CAT score 34). Also with hx of chronic sinus disease but negative nuclear med stress test 2013 (dr Isaac [name]) . In addition he might have undiagnosed OSA.   HE was last seen Apr 25, 2012 and diagnosed with COPD and AECOPD.  He returns for office followup on 07/04/2012 and complained that 3 weeks earler diagnosed with left ear infection that gradually spread to sinuses and "down into lungs". Since then extremely dyspneic, worsening cough and wheeze but no sputum. Completed 10 day course of unknown antibiotic for ear infection several days ago but still no relief from dyspnea. Denies prednisone intake or chest pain. 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 In office, notedto be purse lip breathing and unable to complete sentences. This was unimproved despite xopneex nebs and so admitted reluctantly to [facility] on 07/04/2012  Events since admit:   Current status Lying back at 30 deg hob c/o nose clogged up    Physical Exam: Physical Exam  Mouth/Throat: Oropharynx is clear and moist. No oropharyngeal exudate. XANTELESMA + Eyes: Conjunctivae and EOM are normal. Pupils are equal, round, and reactive to light. Right eye exhibits no discharge. Left eye exhibits no discharge. No scleral icterus.  Neck: Normal range of motion. Neck supple. No JVD present. No tracheal deviation present. No thyromegaly  present.  Obese neck Mallampatti class 3-4  Cardiovascular: Normal rate, regular rhythm and intact distal pulses. Exam reveals no gallop and no friction rub.  No murmur heard.  Pulmonary/Chest:distant bs, no wheeze at all Abdominal: Soft. Bowel sounds are normal. He exhibits no distension and no mass.         Filed Vitals:   07/06/12 1012 07/06/12 1239 07/06/12 1417 07/06/12 1441  BP: 161/89 156/76  153/85  Pulse:  101  95  Temp:    97.4 F (36.3 C)  TempSrc:    Oral  Resp:    20  Height:      Weight:      SpO2:  96% 95% 95%    Labs Results for orders placed during the hospital encounter of 07/04/12 (from the past 48 hour(s))  COMPREHENSIVE METABOLIC PANEL     Status: Abnormal   Collection Time   07/04/12  4:00 PM      Component Value Range Comment   Sodium 138  135 - 145 mEq/L    Potassium 4.3  3.5 - 5.1 mEq/L    Chloride 100  96 - 112 mEq/L    CO2 29  19 - 32 mEq/L    Glucose, Bld 94  70 - 99 mg/dL    BUN 14  6 - 23 mg/dL    Creatinine, Ser 4.09 (*) 0.50 - 1.35 mg/dL    Calcium 9.2  8.4 - 81.1 mg/dL    Total Protein 6.5  6.0 - 8.3 g/dL    Albumin 3.4 (*)  3.5 - 5.2 g/dL    AST 18  0 - 37 U/L    ALT 23  0 - 53 U/L    Alkaline Phosphatase 86  39 - 117 U/L    Total Bilirubin 0.3  0.3 - 1.2 mg/dL    GFR calc non Af Amer 49 (*) >90 mL/min    GFR calc Af Amer 57 (*) >90 mL/min   MAGNESIUM     Status: Normal   Collection Time   07/04/12  4:00 PM      Component Value Range Comment   Magnesium 2.0  1.5 - 2.5 mg/dL   PHOSPHORUS     Status: Normal   Collection Time   07/04/12  4:00 PM      Component Value Range Comment   Phosphorus 3.6  2.3 - 4.6 mg/dL   CARDIAC PANEL(CRET KIN+CKTOT+MB+TROPI)     Status: Abnormal   Collection Time   07/04/12  4:00 PM      Component Value Range Comment   Total CK 155  7 - 232 U/L    CK, MB 4.0  0.3 - 4.0 ng/mL    Troponin I <0.30  <0.30 ng/mL    Relative Index 2.6 (*) 0.0 - 2.5   PRO B NATRIURETIC PEPTIDE     Status: Normal   Collection  Time   07/04/12  4:00 PM      Component Value Range Comment   Pro B Natriuretic peptide (BNP) 28.5  0 - 125 pg/mL   PROCALCITONIN     Status: Normal   Collection Time   07/04/12  4:00 PM      Component Value Range Comment   Procalcitonin <0.10     PROTIME-INR     Status: Normal   Collection Time   07/04/12  4:00 PM      Component Value Range Comment   Prothrombin Time 12.8  11.6 - 15.2 seconds    INR 0.94  0.00 - 1.49   BLOOD GAS, ARTERIAL     Status: Abnormal   Collection Time   07/04/12  4:16 PM      Component Value Range Comment   O2 Content 2.0      Delivery systems NASAL CANNULA      pH, Arterial 7.384  7.350 - 7.450    pCO2 arterial 47.0 (*) 35.0 - 45.0 mmHg    pO2, Arterial 97.6  80.0 - 100.0 mmHg    Bicarbonate 27.4 (*) 20.0 - 24.0 mEq/L    TCO2 24.7  0 - 100 mmol/L    Acid-Base Excess 2.3 (*) 0.0 - 2.0 mmol/L    O2 Saturation 97.4      Patient temperature 98.6      Collection site RIGHT RADIAL      Drawn by [id number]      Sample type ARTERIAL DRAW      Allens test (pass/fail) PASS  PASS   LACTIC ACID, PLASMA     Status: Normal   Collection Time   07/04/12  5:04 PM      Component Value Range Comment   Lactic Acid, Venous 0.8  0.5 - 2.2 mmol/L   CBC     Status: Abnormal   Collection Time   07/04/12  6:13 PM      Component Value Range Comment   WBC 12.5 (*) 4.0 - 10.5 K/uL    RBC 4.69  4.22 - 5.81 MIL/uL    Hemoglobin 12.2 (*) 13.0 - 17.0 g/dL  HCT 38.3 (*) 39.0 - 52.0 %    MCV 81.7  78.0 - 100.0 fL    MCH 26.0  26.0 - 34.0 pg    MCHC 31.9  30.0 - 36.0 g/dL    RDW 19.1  47.8 - 29.5 %    Platelets 375  150 - 400 K/uL   DIFFERENTIAL     Status: Abnormal   Collection Time   07/04/12  6:13 PM      Component Value Range Comment   Neutrophils Relative 63  43 - 77 %    Lymphocytes Relative 22  12 - 46 %    Monocytes Relative 13 (*) 3 - 12 %    Eosinophils Relative 2  0 - 5 %    Basophils Relative 0  0 - 1 %    Neutro Abs 7.8 (*) 1.7 - 7.7 K/uL    Lymphs Abs 2.8  0.7 - 4.0  K/uL    Monocytes Absolute 1.6 (*) 0.1 - 1.0 K/uL    Eosinophils Absolute 0.3  0.0 - 0.7 K/uL    Basophils Absolute 0.0  0.0 - 0.1 K/uL    WBC Morphology MILD LEFT SHIFT (1-5% METAS, OCC MYELO, OCC BANDS)     CARDIAC PANEL(CRET KIN+CKTOT+MB+TROPI)     Status: Abnormal   Collection Time   07/04/12 11:48 PM      Component Value Range Comment   Total CK 156  7 - 232 U/L    CK, MB 4.1 (*) 0.3 - 4.0 ng/mL    Troponin I <0.30  <0.30 ng/mL    Relative Index 2.6 (*) 0.0 - 2.5   CARDIAC PANEL(CRET KIN+CKTOT+MB+TROPI)     Status: Abnormal   Collection Time   07/05/12  7:25 AM      Component Value Range Comment   Total CK 130  7 - 232 U/L    CK, MB 4.5 (*) 0.3 - 4.0 ng/mL    Troponin I <0.30  <0.30 ng/mL    Relative Index 3.5 (*) 0.0 - 2.5     Imaging results:  Portable Chest Xray  07/04/2012  *RADIOLOGY REPORT*  Clinical Data: COPD exacerbation  PORTABLE CHEST - 1 VIEW  Comparison: 12/24/2011  Findings: Heart size and mediastinal contours are normal.  No pleural effusion or edema identified.  No airspace consolidation identified.  Mild chronic bronchitic changes noted.  IMPRESSION:  1.  No acute cardiopulmonary abnormalities.  Original Report Authenticated By: Rosealee Albee, M.D.     Assessment & Plan  Patient Active Hospital Problem List: COPD, severe (04/25/2012)   Assessment: BAseline Gold stage 4 copd based on feb 2013 pft   Plan: Encourage to quit smoking  COPD exacerbation (07/04/2012)   Assessment: AECOPD   Plan: Floor admit, nebs, avelox, o2, steroids  Sleep apnea (04/25/2012)   Assessment: Does not wear cpap   Plan: monitor  Tobacco Abuse   Plan: Needs to quit 8/10 reports he quit a year ago.  DDX of  difficult airways managment all start with A and  include Adherence, Ace Inhibitors, Acid Reflux, Active Sinus Disease, Alpha 1 Antitripsin deficiency, Anxiety masquerading as Airways dz,  ABPA,  allergy(esp in young), Aspiration (esp in elderly), Adverse effects of DPI,  Active  smokers, plus two Bs  = Bronchiectasis and Beta blocker use..and one C= CHF a  ? Acid reflux > rx plus stop fish oil, max ppi  ? Acute sinusitis > sinus ct ordered 8/11 >>>  ? chf > ruled out with  bnp <<100  HBP  rx clonidine, should help with anxiety also    Sandrea Hughs, MD Pulmonary and Critical Care Medicine [facility] Cell [phone]

## 2012-07-06 NOTE — Progress Notes (Signed)
Patient complains of worsening non-productive cough that is disturbing his rest and would like  md called for expectorant. Notification in process.

## 2012-07-07 DIAGNOSIS — I10 Essential (primary) hypertension: Secondary | ICD-10-CM

## 2012-07-07 DIAGNOSIS — N289 Disorder of kidney and ureter, unspecified: Secondary | ICD-10-CM

## 2012-07-07 LAB — BASIC METABOLIC PANEL
BUN: 22 mg/dL (ref 6–23)
CO2: 29 mEq/L (ref 19–32)
Calcium: 9.2 mg/dL (ref 8.4–10.5)
Chloride: 101 mEq/L (ref 96–112)
Creatinine, Ser: 1.05 mg/dL (ref 0.50–1.35)
GFR calc Af Amer: 90 mL/min — ABNORMAL LOW (ref >90)
GFR calc non Af Amer: 78 mL/min — ABNORMAL LOW (ref >90)
Glucose, Bld: 187 mg/dL — ABNORMAL HIGH (ref 70–99)
Potassium: 4.1 mEq/L (ref 3.5–5.1)
Sodium: 138 mEq/L (ref 135–145)

## 2012-07-07 MED ORDER — FLUTICASONE PROPIONATE 50 MCG/ACT NA SUSP
2.0000 | Freq: Two times a day (BID) | NASAL | Status: DC
  Administered 2012-07-07 – 2012-07-09 (×4): 2 via NASAL
  Filled 2012-07-07: qty 16

## 2012-07-07 MED ORDER — DM-GUAIFENESIN ER 30-600 MG PO TB12
1.0000 | ORAL_TABLET | Freq: Two times a day (BID) | ORAL | Status: DC
  Administered 2012-07-07 – 2012-07-08 (×4): 1 via ORAL
  Administered 2012-07-09: 10:00:00 via ORAL
  Filled 2012-07-07 (×6): qty 1

## 2012-07-07 MED ORDER — CLONIDINE HCL 0.2 MG PO TABS
0.2000 mg | ORAL_TABLET | Freq: Three times a day (TID) | ORAL | Status: DC
  Administered 2012-07-07 – 2012-07-09 (×6): 0.2 mg via ORAL
  Filled 2012-07-07 (×8): qty 1

## 2012-07-07 MED ORDER — SALINE SPRAY 0.65 % NA SOLN
2.0000 | Freq: Four times a day (QID) | NASAL | Status: DC
Start: 2012-07-07 — End: 2012-07-09
  Administered 2012-07-07 – 2012-07-09 (×8): 2 via NASAL
  Filled 2012-07-07: qty 44

## 2012-07-07 MED ORDER — OXYMETAZOLINE HCL 0.05 % NA SOLN
2.0000 | Freq: Two times a day (BID) | NASAL | Status: DC
  Administered 2012-07-07 – 2012-07-09 (×4): 2 via NASAL
  Filled 2012-07-07: qty 15

## 2012-07-07 NOTE — Progress Notes (Signed)
PT Cancellation Note  Treatment cancelled today due to medical issues with patient which prohibited therapy.  Took pts BP with large cuff on forearm with reading of 190/130 then 198/125.  RN notified and xlarge cuff found, re-checked BP and still 167/95.  PT eval deferred at this time.  RN to page MD.    Thanks,   Page, Meribeth Mattes 07/07/2012, 5:04 PM [phone]

## 2012-07-07 NOTE — Progress Notes (Addendum)
Hospital Admission Note Date of admit: 07/04/2012  3:17 PM LOS 3 days  Date: 07/07/2012  Patient name: Isaac [name] Medical record number: [id number] Date of birth: [birth year] Age: 56 y.o. Gender: male PCP: [name], MD  Medical Service: PCCM,MD  Brief Summary: AECOPD in [place] stage 4 copd patient   Best Practice/Protocols:  VTE Prophylaxis: Heparin (SQ) SUP proph: protonix      Brief patient profile:  56 year old Obese (BMI 38), 35 pack smoker diagnosed May 2013 with gold stage 4 copd (class 3 dyspnea, exertional hypoexmia of 185 feet, and CAT score 34). Also with hx of chronic sinus disease but negative nuclear med stress test 2013 (dr Jacinto Halim) . In addition he might have undiagnosed OSA.   HE was last seen Apr 25, 2012 and diagnosed with COPD and AECOPD.  He returned for office followup on 07/04/2012 and complained that 3 weeks earler diagnosed with left ear infection that gradually spread to sinuses and "down into lungs". Since then extremely dyspneic, worsening cough and wheeze but no sputum. Completed 10 day course of unknown antibiotic for ear infection several days ago but still no relief from dyspnea. Denies prednisone intake or chest pain. 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 In office, noted to be purse lip breathing and unable to complete sentences. This was unimproved despite xopneex nebs and so admitted reluctantly to [facility] on 07/04/2012  Events/ key studies since admit:  CT sinus 8/11> min mastoiditis   Current status Lying back at 30 deg hob c/o nose clogged up    Physical Exam: Physical Exam  Mouth/Throat: Oropharynx is clear and moist. No oropharyngeal exudate. XANTELESMA + Eyes: Conjunctivae and EOM are normal. Pupils are equal, round, and reactive to light. Right eye exhibits no discharge. Left eye exhibits no discharge. No scleral icterus.  Neck: Normal range of motion. Neck supple. No JVD present. No  tracheal deviation present. No thyromegaly present.  Obese neck Mallampatti class 3-4  Cardiovascular: Normal rate, regular rhythm and intact distal pulses. Exam reveals no gallop and no friction rub.  No murmur heard.  Pulmonary/Chest:distant bs, + pseudo wheeze  Abdominal: Soft. Bowel sounds are normal. He exhibits no distension and no mass.         Filed Vitals:   07/07/12 0620 07/07/12 0656 07/07/12 0657 07/07/12 0735  BP: 169/102 162/90 160/86   Pulse: 85     Temp: 97.6 F (36.4 C)     TempSrc: Oral     Resp: 22     Height:      Weight: 122.471 kg (270 lb)     SpO2: 95%   93%  FIO2 2lpm   Labs No results found for this or any previous visit (from the past 48 hour(s)).  Imaging results:  Ct Maxillofacial  Ltd Wo Cm  07/06/2012  *RADIOLOGY REPORT*  Clinical Data:  .  Chronic left sided sinus symptoms.  CT LIMITED SINUSES WITHOUT CONTRAST  Technique:  Multidetector CT images of the paranasal sinuses were obtained in a single plane without contrast.  Comparison:  02/19/2005.  Findings:  The patient was not able to perform coronal imaging. Axial imaging performed.  Prominence of the right internal carotid artery cavernous segment. Although this may represent ectasia, aneurysm not excluded (series 3 image 13).  Hyperostosis frontalis interna asymmetric greater on the left.  Motion degraded exam.  Paranasal sinuses are clear.  Minimal partial opacification inferior mastoid air cells. No  obvious mass causing eustachian tube dysfunction noted.  IMPRESSION:  Motion degraded exam.  Paranasal sinuses are clear.  Minimal partial opacification inferior mastoid air cells.  Prominence of the right internal carotid artery cavernous segment. Although this may represent ectasia, aneurysm not excluded (series 3 image 13).  Original Report Authenticated By: Fuller Canada, M.D.     Assessment & Plan   COPD exacerbation in setting of gold stage 4 COPD(07/04/2012) ? Acid reflux > rx plus stop fish  oil, max ppi ? Acute sinusitis > sinus ct ordered 8/11 >>>some sinusitis in mastoids.  ? chf > ruled out with bnp <<100 ? Anxiety> dx of exclusion but likely a large component here  Assessment: AECOPD: seems like upper airway is the largest contributing factor here. Plan Stop smoking Change mucinex to mucinex dm Stop fish oil Cont ppi bid (he has not been compliant w/ his PPI) Add saline, afrin, nasal steroid nasal hygiene regimen Cont avelox Repeat bmet to see if ok to do ctangiogram   Sleep apnea (04/25/2012)   Assessment: Does not wear cpap   Plan: monitor  Tobacco Abuse   Plan: Needs to quit 8/10 reports he quit a year ago.   HBP  rx clonidine, should help with anxiety also Stopped cozaar 8/12 due to elevated creat in hopes will improve and can do ctangiogram    Sandrea Hughs, MD Pulmonary and Critical Care Medicine [facility] Cell [phone]

## 2012-07-08 DIAGNOSIS — N289 Disorder of kidney and ureter, unspecified: Secondary | ICD-10-CM

## 2012-07-08 DIAGNOSIS — I10 Essential (primary) hypertension: Secondary | ICD-10-CM

## 2012-07-08 MED ORDER — ALBUTEROL SULFATE (5 MG/ML) 0.5% IN NEBU
2.5000 mg | INHALATION_SOLUTION | Freq: Three times a day (TID) | RESPIRATORY_TRACT | Status: DC
  Administered 2012-07-08 – 2012-07-09 (×3): 2.5 mg via RESPIRATORY_TRACT
  Filled 2012-07-08 (×2): qty 0.5

## 2012-07-08 MED ORDER — ALBUTEROL SULFATE (5 MG/ML) 0.5% IN NEBU
2.5000 mg | INHALATION_SOLUTION | RESPIRATORY_TRACT | Status: DC | PRN
  Administered 2012-07-08 – 2012-07-09 (×3): 2.5 mg via RESPIRATORY_TRACT
  Filled 2012-07-08 (×3): qty 0.5

## 2012-07-08 MED ORDER — IPRATROPIUM BROMIDE 0.02 % IN SOLN
0.5000 mg | RESPIRATORY_TRACT | Status: DC | PRN
  Administered 2012-07-08: 0.5 mg via RESPIRATORY_TRACT
  Filled 2012-07-08: qty 2.5

## 2012-07-08 MED ORDER — PREDNISONE 20 MG PO TABS
40.0000 mg | ORAL_TABLET | Freq: Every day | ORAL | Status: DC
  Administered 2012-07-09: 40 mg via ORAL
  Filled 2012-07-08 (×2): qty 2

## 2012-07-08 MED ORDER — IPRATROPIUM BROMIDE 0.02 % IN SOLN: 0.5000 mg | RESPIRATORY_TRACT | Status: DC | PRN

## 2012-07-08 NOTE — Plan of Care (Signed)
Problem: Phase II Progression Outcomes Goal: Activity at appropriate level-compared to baseline (UP IN CHAIR FOR HEMODIALYSIS)  Outcome: Progressing O2 sats 91% on 3L O2, dyspnea level 3/4 with ambulation during PT eval. Recommend pt ambulate as tolerated with nursing supervision.

## 2012-07-08 NOTE — Significant Event (Signed)
Ambulatory room air saturations: 92% After ambulating <50 ft nadir was 86% with marked work of breathing and 10/10 perceived dyspnea.  It took ~ 2 minutes for sats to rise above 90% on room air. They rose quickly to 95% after application of supplemental oxygen at 2 liters.   Plan: Will d/c to home on 2 liters oxygen

## 2012-07-08 NOTE — Evaluation (Signed)
Physical Therapy Evaluation Patient Details Name: Isaac [name] MRN: [id number] DOB: 01-14-1956 Today's Date: 07/08/2012 Time: 4098-1191 PT Time Calculation (min): 11 min  PT Assessment / Plan / Recommendation Clinical Impression  56 yo male admitted with COPD exacerbation. O2 sats 91% on 3L O2. Required 1 short standing rest break after 75 feet. Encouraged pt to ambulate as tolerated with nursing supervision since pt states his legs feel a little wobbly. Did not feel pt needs a RW. Will follow up on tomorrow to assess pt's ability to climb stairs.     PT Assessment  Patient needs continued PT services    Follow Up Recommendations  No PT follow up    Barriers to Discharge        Equipment Recommendations  None recommended by PT    Recommendations for Other Services     Frequency Min 3X/week    Precautions / Restrictions Precautions Precautions: None Restrictions Weight Bearing Restrictions: No   Pertinent Vitals/Pain 91% on 3L O2. Dyspnea 3/4      Mobility  Bed Mobility Bed Mobility: Not assessed Transfers Transfers: Sit to Stand;Stand to Sit Sit to Stand: 6: Modified independent (Device/Increase time) Stand to Sit: 6: Modified independent (Device/Increase time) Ambulation/Gait Ambulation/Gait Assistance: 4: Min guard Ambulation Distance (Feet): 150 Feet (75" x 2) Assistive device: None Ambulation/Gait Assistance Details: Slight unsteadiness intermittently. Good gait speed. 1 standing rest break after 75 feet. O2 sats 91% on 3L O2. Dyspnea 3/4.  Gait Pattern: Within Functional Limits    Exercises     PT Diagnosis: Difficulty walking  PT Problem List: Decreased activity tolerance;Decreased mobility PT Treatment Interventions: Functional mobility training;Stair training;Gait training   PT Goals Acute Rehab PT Goals PT Goal Formulation: With patient/family Time For Goal Achievement: 07/15/12 Potential to Achieve Goals: Good Pt will Ambulate: >150 feet;with  modified independence PT Goal: Ambulate - Progress: Goal set today Pt will Go Up / Down Stairs: 3-5 stairs;with modified independence PT Goal: Up/Down Stairs - Progress: Goal set today  Visit Information  Last PT Received On: 07/08/12 Assistance Needed: +1    Subjective Data  Subjective: "Its a little better today" Patient Stated Goal: Home. Back to work.    Prior Functioning  Home Living Lives With: Spouse Available Help at Discharge: Family Type of Home: House Home Access: Stairs to enter [employer] of Steps: 3 in front, 4 in back Entrance Stairs-Rails: None Home Layout: One level Bathroom Shower/Tub: Engineer, manufacturing systems: Standard Home Adaptive Equipment: Paediatric nurse without back;Straight cane Prior Function Level of Independence: Independent Able to Take Stairs?: Yes Driving: Yes Vocation: Part time employment Communication Communication: No difficulties    Cognition  Overall Cognitive Status: Appears within functional limits for tasks assessed/performed Arousal/Alertness: Awake/alert Orientation Level: Appears intact for tasks assessed Behavior During Session: [facility] for tasks performed    Extremity/Trunk Assessment Right Lower Extremity Assessment RLE ROM/Strength/Tone: [facility] for tasks assessed Left Lower Extremity Assessment LLE ROM/Strength/Tone: [facility] for tasks assessed Trunk Assessment Trunk Assessment: Normal   Balance    End of Session PT - End of Session Equipment Utilized During Treatment: Gait belt;Oxygen Activity Tolerance: Patient limited by fatigue Patient left: in bed;with call bell/phone within reach;with family/visitor present  GP     Rebeca Alert [facility] 07/08/2012, 12:21 PM [phone]

## 2012-07-08 NOTE — Progress Notes (Signed)
Hospital Admission Note Date of admit: 07/04/2012  3:17 PM LOS 4 days  Date: 07/08/2012  Patient name: Isaac [name] Medical record number: [id number] Date of birth: February 19, 1956 Age: 56 y.o. Gender: male PCP: [name], MD  Medical Service: PCCM,MD  Brief Summary: 56 yo male smoker admitted 07/04/2012 with AECOPD after recent sinus infection.  Has hx of of GOLD 4 COPD, Chronic sinusitis.  Best Practice/Protocols:  VTE Prophylaxis: Heparin (SQ) SUP proph: protonix  Events/ key studies since admit: CT sinus 8/11> min mastoiditis  Current status Sinus congestion and reflux better.  Anxious to go home.  Filed Vitals:   07/08/12 0617  BP: 153/88  Pulse: 88  Temp: 97.7 F (36.5 C)  Resp: 20    Intake/Output Summary (Last 24 hours) at 07/08/12 1101 Last data filed at 07/08/12 0930  Gross per 24 hour  Intake   1080 ml  Output   1350 ml  Net   -270 ml    Physical Exam: General - obese, no distress HEENT - no sinus tenderness Cardiac - s1s2 regular, no murmur Chest - decreased breath sounds, no wheeze Abd - soft, non tender Ext - no edema Neuro - normal strength, follows commands Skin - no rash Psych - normal mood, behavior    Labs Lab Results  Component Value Date   WBC 12.5* 07/04/2012   HGB 12.2* 07/04/2012   HCT 38.3* 07/04/2012   MCV 81.7 07/04/2012   PLT 375 07/04/2012   Lab Results  Component Value Date   CREATININE 1.05 07/07/2012   BUN 22 07/07/2012   NA 138 07/07/2012   K 4.1 07/07/2012   CL 101 07/07/2012   CO2 29 07/07/2012    Imaging results:  Ct Maxillofacial  Ltd Wo Cm  07/06/2012  *RADIOLOGY REPORT*  Clinical Data:  .  Chronic left sided sinus symptoms.  CT LIMITED SINUSES WITHOUT CONTRAST  Technique:  Multidetector CT images of the paranasal sinuses were obtained in a single plane without contrast.  Comparison:  02/19/2005.  Findings:  The patient was not able to perform coronal imaging. Axial imaging performed.  Prominence of the right internal carotid  artery cavernous segment. Although this may represent ectasia, aneurysm not excluded (series 3 image 13).  Hyperostosis frontalis interna asymmetric greater on the left.  Motion degraded exam.  Paranasal sinuses are clear.  Minimal partial opacification inferior mastoid air cells. No obvious mass causing eustachian tube dysfunction noted.  IMPRESSION:  Motion degraded exam.  Paranasal sinuses are clear.  Minimal partial opacification inferior mastoid air cells.  Prominence of the right internal carotid artery cavernous segment. Although this may represent ectasia, aneurysm not excluded (series 3 image 13).  Original Report Authenticated By: Fuller Canada, M.D.     Assessment & Plan   AECOPD with hx of GOLD 4 COPD and continued tobacco abuse>>improved.  Some of his symptoms likely related to upper airway irritation from post-nasal drip and reflux. P: Change solumedrol to prednisone 40 mg daily Resume symbicort Will need to resume tudorza as outpt D 5/7 avelox Change nebulizer to tid and q2h prn as transition to inhalers Needs to stop smoking Continue bronchial hygiene regimen  Acute on chronic sinusitis>>improved. P: Continue flonase and saline nasal spray Continue afrin for now Continue singulair Continue avelox  GERD as contributor to upper airway irritation>>improved. P: Continue BID protonix D/c'ed fish oil  Probable OSA. P: Will need further assessment as outpt  Hypertension. P: Cozaar held 8/12 due to elevated creatinine Continue clonidine  If stable with transition from IV to oral steroids, then likely d/c home 8/14.  Coralyn Helling, MD [facility] Pulmonary/Critical Care 07/08/2012, 11:11 AM Pager:  [phone] After 3pm call: [phone]

## 2012-07-08 NOTE — Discharge Summary (Signed)
Physician Discharge Summary     Patient ID: Isaac [name] MRN: [id number] DOB/AGE: 57-Sep-1957 57 y.o.  Admit date: 07/04/2012 Discharge date: 07/09/2012  Admission Diagnoses: Acute dyspnea Acute exacerbation of COPD  Discharge Diagnoses:  Active Problems:  COPD, severe  Sleep apnea  Obesity  COPD exacerbation  Renal insufficiency  Hypertension   Significant Hospital tests/ studies/ interventions and procedures:  See CT scan report below.   Brief Summary: 56 yo male smoker admitted 07/04/2012 with AECOPD after recent sinus infection. Has hx of of GOLD 4 COPD, Chronic sinusitis.  Hospital Course:  AECOPD with hx of GOLD 4 COPD and continued tobacco abuse>>improved. Admitted to regular med/surg bed. Treated in the usual fashion with IV systemic steroids, Bronchodilators, oxygen, and empiric respiratory quinolone therapy. His stay was prolonged by what appeared to be upper-airway disease exacerbated by post-nasal drip and reflux. On 8/12 we escalated his nasal hygiene regimen which seemed to result in significant decrease in his over all perception of dyspnea.  Walking oximetry was checked prior to d/c. His saturations dropped to 86% on room air after ambulating ~50 ft flat surface in the unit hallway. He will be discharged to home w/ close follow-up in our clinic.  P:  Home on supplemental oxygen Slow prednisone taper Resume symbicort  resume tudorza as outpt  D 6/7 avelox  Needs to stop smoking  Continue bronchial hygiene regimen   Acute on chronic sinusitis>>improved.  P:  Continue flonase and saline nasal spray  Continue afrin for now  Continue singulair  Continue avelox   GERD as contributor to upper airway irritation>>improved.  He admits during this stay that he had not been taking his GERD treatment sighting that he did not notice reflux unless he was eating a meal that might be particularly acidic. He was started on aggressive GERD treatment.   P:  Continue BID  protonix  D/c'ed fish oil   Probable OSA.  P:  Will need further assessment as outpt   Hypertension.  P:  Cozaar held 8/12 due to elevated creatinine  Continue clonidine will defer to Dr Selena Batten in the Future Re: change in this rx.    Discharge Exam: BP 131/85  Pulse 81  Temp 97.9 F (36.6 C) (Oral)  Resp 18  Ht 5\' 11"  (1.803 m)  Wt 124.5 kg (274 lb 7.6 oz)  BMI 38.28 kg/m2  SpO2 95%  Physical Exam:  General - obese, no distress  HEENT - no sinus tenderness  Cardiac - s1s2 regular, no murmur  Chest - decreased breath sounds, no wheeze  Abd - soft, non tender  Ext - no edema  Neuro - normal strength, follows commands  Skin - no rash  Psych - normal mood, behavior   Labs at discharge Lab Results  Component Value Date   CREATININE 1.05 07/07/2012   BUN 22 07/07/2012   NA 138 07/07/2012   K 4.1 07/07/2012   CL 101 07/07/2012   CO2 29 07/07/2012   Lab Results  Component Value Date   WBC 12.5* 07/04/2012   HGB 12.2* 07/04/2012   HCT 38.3* 07/04/2012   MCV 81.7 07/04/2012   PLT 375 07/04/2012   Lab Results  Component Value Date   ALT 23 07/04/2012   AST 18 07/04/2012   ALKPHOS 86 07/04/2012   BILITOT 0.3 07/04/2012   Lab Results  Component Value Date   INR 0.94 07/04/2012    Current radiology studies No results found.  Disposition:  Final discharge disposition not  confirmed  Discharge Orders    Future Appointments: Provider: Department: Dept Phone: Center:   07/15/2012 3:30 PM Julio Sicks, NP [facility] [phone] None     Future Orders Please Complete By Expires   For home use only DME oxygen      Questions: Responses:   Mode or (Route) Nasal cannula   Liters per Minute 2   Frequency Continuous   Diet - low sodium heart healthy      Increase activity slowly      Call MD for:  temperature >100.4      Call MD for:  difficulty breathing, headache or visual disturbances        Medication List  As of 07/09/2012 10:56 AM   STOP taking these medications          budesonide-formoterol 160-4.5 MCG/ACT inhaler      fish oil-omega-3 fatty acids 1000 MG capsule      losartan 50 MG tablet      terazosin 1 MG capsule      TUDORZA PRESSAIR 400 MCG/ACT Aepb         TAKE these medications         albuterol 108 (90 BASE) MCG/ACT inhaler   Commonly known as: PROVENTIL HFA;VENTOLIN HFA   Inhale 2 puffs into the lungs every 6 (six) hours as needed.      albuterol (5 MG/ML) 0.5% nebulizer solution   Commonly known as: PROVENTIL   Take 0.5 mLs (2.5 mg total) by nebulization 4 (four) times daily.      budesonide 0.5 MG/2ML nebulizer solution   Commonly known as: PULMICORT   Take 2 mLs (0.5 mg total) by nebulization 2 (two) times daily.      cloNIDine 0.2 MG tablet   Commonly known as: CATAPRES   Take 1 tablet (0.2 mg total) by mouth 3 (three) times daily.      dextromethorphan-guaiFENesin 30-600 MG per 12 hr tablet   Commonly known as: MUCINEX DM   Take 1 tablet by mouth 2 (two) times daily.      FLUoxetine 10 MG capsule   Commonly known as: PROZAC   Take 1 capsule (10 mg total) by mouth daily. occasionally      fluticasone 50 MCG/ACT nasal spray   Commonly known as: FLONASE   Place 2 sprays into the nose daily.      ipratropium 0.02 % nebulizer solution   Commonly known as: ATROVENT   Take 2.5 mLs (0.5 mg total) by nebulization 4 (four) times daily.      montelukast 10 MG tablet   Commonly known as: SINGULAIR   Take 10 mg by mouth at bedtime.      moxifloxacin 400 MG tablet   Commonly known as: AVELOX   Take 1 tablet (400 mg total) by mouth daily at 8 pm.      omeprazole 20 MG capsule   Commonly known as: PRILOSEC   Take 2 capsules (40 mg total) by mouth daily. For GERD.      oxymetazoline 0.05 % nasal spray   Commonly known as: AFRIN   Place 2 sprays into the nose 2 (two) times daily.      predniSONE 10 MG tablet   Commonly known as: DELTASONE   Take 4 tabs qd x  3 d, then 3 tab po qd x 3d, then 2 tab qd x 3d, then 1 tab  qd x3 d      traZODone 50 MG tablet   Commonly  known as: DESYREL   Take 50 mg by mouth at bedtime as needed. For sleep.           Follow-up Information    Follow up with PARRETT,TAMMY, NP on 07/15/2012. (at 330 pm)    Contact information:   [employer] [address] [place] [address] [phone]       Follow up with [name], MD in 2 weeks.   Contact information:   [address] [place] [address] [phone]          Discharged Condition: fair Ambulates ~ 25 feet before onset of dyspnea, ambulated 50 ft on room air before sats dropped to 86%. These sats maintained above 88% after application of 2 liters oxygen. His perception of dyspnea remained about the same.   Physician Statement:   The Patient was personally examined, the discharge assessment and plan has been personally reviewed and I agree with ACNP Babcock's assessment and plan. > 30 minutes of time have been dedicated to discharge assessment, planning and discharge instructions.   SignedAnders Simmonds 07/09/2012, 10:56 AM  Coralyn Helling, MD [facility] Pulmonary/Critical Care 07/09/2012, 10:57 AM Pager:  [phone] After 3pm call: [phone]

## 2012-07-08 NOTE — Progress Notes (Signed)
SATURATION QUALIFICATIONS:  Patient Saturations on Room Air at Rest = 92%  Patient Saturations on Room Air while Ambulating =86%  Patient Saturations on 2 Liters of oxygen while Ambulating = 94%  Statement of medical necessity for home oxygen:

## 2012-07-09 MED ORDER — MOXIFLOXACIN HCL 400 MG PO TABS: 400.0000 mg | ORAL_TABLET | Freq: Every day | ORAL | Status: AC

## 2012-07-09 MED ORDER — IPRATROPIUM BROMIDE 0.02 % IN SOLN: 0.5000 mg | Freq: Four times a day (QID) | RESPIRATORY_TRACT | Status: DC

## 2012-07-09 MED ORDER — CLONIDINE HCL 0.2 MG PO TABS: 0.2000 mg | ORAL_TABLET | Freq: Three times a day (TID) | ORAL | Status: DC

## 2012-07-09 MED ORDER — IPRATROPIUM BROMIDE 0.02 % IN SOLN
0.5000 mg | Freq: Four times a day (QID) | RESPIRATORY_TRACT | Status: DC
  Administered 2012-07-09: 0.5 mg via RESPIRATORY_TRACT
  Filled 2012-07-09: qty 2.5

## 2012-07-09 MED ORDER — FLUTICASONE PROPIONATE 50 MCG/ACT NA SUSP: 2.0000 | Freq: Every day | NASAL | Status: DC

## 2012-07-09 MED ORDER — ALBUTEROL SULFATE (5 MG/ML) 0.5% IN NEBU: 2.5000 mg | INHALATION_SOLUTION | Freq: Four times a day (QID) | RESPIRATORY_TRACT | Status: DC

## 2012-07-09 MED ORDER — BUDESONIDE 0.5 MG/2ML IN SUSP
0.5000 mg | Freq: Two times a day (BID) | RESPIRATORY_TRACT | Status: DC
  Administered 2012-07-09: 0.5 mg via RESPIRATORY_TRACT
  Filled 2012-07-09 (×3): qty 2

## 2012-07-09 MED ORDER — DM-GUAIFENESIN ER 30-600 MG PO TB12: 1.0000 | ORAL_TABLET | Freq: Two times a day (BID) | ORAL | Status: AC

## 2012-07-09 MED ORDER — PREDNISONE 10 MG PO TABS: ORAL_TABLET | ORAL | Status: DC

## 2012-07-09 MED ORDER — ALBUTEROL SULFATE (5 MG/ML) 0.5% IN NEBU
2.5000 mg | INHALATION_SOLUTION | Freq: Four times a day (QID) | RESPIRATORY_TRACT | Status: DC
  Administered 2012-07-09: 2.5 mg via RESPIRATORY_TRACT
  Filled 2012-07-09: qty 0.5

## 2012-07-09 MED ORDER — OMEPRAZOLE 20 MG PO CPDR: 40.0000 mg | DELAYED_RELEASE_CAPSULE | Freq: Every day | ORAL | Status: DC

## 2012-07-09 MED ORDER — OXYMETAZOLINE HCL 0.05 % NA SOLN: 2.0000 | Freq: Two times a day (BID) | NASAL | Status: AC

## 2012-07-09 MED ORDER — FLUOXETINE HCL 10 MG PO CAPS: 10.0000 mg | ORAL_CAPSULE | Freq: Every day | ORAL | Status: DC

## 2012-07-09 MED ORDER — BUDESONIDE 0.5 MG/2ML IN SUSP: 0.5000 mg | Freq: Two times a day (BID) | RESPIRATORY_TRACT | Status: DC

## 2012-07-09 NOTE — Progress Notes (Signed)
Physical Therapy Treatment Patient Details Name: Isaac [name] MRN: [id number] DOB: 11/25/1956 Today's Date: 07/09/2012 Time: 4098-1191 PT Time Calculation (min): 10 min  PT Assessment / Plan / Recommendation Comments on Treatment Session  Pt discharging home today on O2. No further questions/concerns from pt/wife. Recommended pt ambulate as tolerated and to increase distance as tolerance improves. Also instructed pt to sit and rest as needed/when SOB. Do not feel pt needs any follow-up PT at discharge.     Follow Up Recommendations  No PT follow up    Barriers to Discharge        Equipment Recommendations  None recommended by PT    Recommendations for Other Services    Frequency Min 3X/week   Plan Discharge plan remains appropriate    Precautions / Restrictions Precautions Precautions: None Restrictions Weight Bearing Restrictions: No   Pertinent Vitals/Pain     Mobility  Bed Mobility Bed Mobility: Not assessed Transfers Transfers: Sit to Stand;Stand to Sit Sit to Stand: 6: Modified independent (Device/Increase time) Stand to Sit: 6: Modified independent (Device/Increase time) Ambulation/Gait Ambulation/Gait Assistance: 6: Modified independent (Device/Increase time) Ambulation Distance (Feet): 75 Feet Assistive device: None Ambulation/Gait Assistance Details: Ambulated on 2L O2-pt will be going home with O2. Dyspnea 2/4 with activity. No LOB. Pt able to push O2 tank as well.  Gait Pattern: Within Functional Limits Stairs: Yes Stairs Assistance: 4: Min guard Stairs Assistance Details (indicate cue type and reason): Pt used rail intermittently.  Stair Management Technique: Alternating pattern Number of Stairs: 3     Exercises     PT Diagnosis:    PT Problem List:   PT Treatment Interventions:     PT Goals Acute Rehab PT Goals Pt will Ambulate: >150 feet;with modified independence PT Goal: Ambulate - Progress: Not progressing Pt will Go Up / Down Stairs: 3-5  stairs;with modified independence PT Goal: Up/Down Stairs - Progress: Progressing toward goal  Visit Information  Last PT Received On: 07/09/12 Assistance Needed: +1    Subjective Data  Subjective: "I'm going home today" Patient Stated Goal: Home   Cognition  Overall Cognitive Status: Appears within functional limits for tasks assessed/performed Arousal/Alertness: Awake/alert Orientation Level: Appears intact for tasks assessed Behavior During Session: [facility] for tasks performed    Balance     End of Session PT - End of Session Equipment Utilized During Treatment: Gait belt Activity Tolerance: Patient tolerated treatment well Patient left: in chair;with call bell/phone within reach;with family/visitor present   GP     Rebeca Alert [facility] 07/09/2012, 11:13 AM [phone]

## 2012-07-09 NOTE — Progress Notes (Signed)
Hospital Admission Note Date of admit: 07/04/2012  3:17 PM LOS 5 days  Date: 07/09/2012  Patient name: Isaac [name] Medical record number: [id number] Date of birth: Jul 29, 1956 Age: 56 y.o. Gender: male PCP: [name], MD  Medical Service: PCCM,MD  Brief Summary: 56 yo male smoker admitted 07/04/2012 with AECOPD after recent sinus infection.  Has hx of of GOLD 4 COPD, Chronic sinusitis.  Best Practice/Protocols:  VTE Prophylaxis: Heparin (SQ) SUP proph: protonix  Events/ key studies since admit: CT sinus 8/11> min mastoiditis  Current status Breathing better.  Feels that neb meds work better for him.  Sinus congestion improved.  Filed Vitals:   07/09/12 0620  BP: 131/85  Pulse: 81  Temp:   Resp:     Intake/Output Summary (Last 24 hours) at 07/09/12 1027 Last data filed at 07/09/12 0900  Gross per 24 hour  Intake    480 ml  Output   1125 ml  Net   -645 ml    Physical Exam: General - obese, no distress HEENT - no sinus tenderness Cardiac - s1s2 regular, no murmur Chest - decreased breath sounds, no wheeze Abd - soft, non tender Ext - no edema Neuro - normal strength, follows commands Skin - no rash Psych - normal mood, behavior    Labs Lab Results  Component Value Date   WBC 12.5* 07/04/2012   HGB 12.2* 07/04/2012   HCT 38.3* 07/04/2012   MCV 81.7 07/04/2012   PLT 375 07/04/2012   Lab Results  Component Value Date   CREATININE 1.05 07/07/2012   BUN 22 07/07/2012   NA 138 07/07/2012   K 4.1 07/07/2012   CL 101 07/07/2012   CO2 29 07/07/2012    Imaging results:  Portable Chest Xray  07/04/2012  *RADIOLOGY REPORT*  Clinical Data: COPD exacerbation  PORTABLE CHEST - 1 VIEW  Comparison: 12/24/2011  Findings: Heart size and mediastinal contours are normal.  No pleural effusion or edema identified.  No airspace consolidation identified.  Mild chronic bronchitic changes noted.  IMPRESSION:  1.  No acute cardiopulmonary abnormalities.  Original Report Authenticated By:  Rosealee Albee, M.D.   Ct Maxillofacial  Ltd Wo Cm  07/06/2012  *RADIOLOGY REPORT*  Clinical Data:  .  Chronic left sided sinus symptoms.  CT LIMITED SINUSES WITHOUT CONTRAST  Technique:  Multidetector CT images of the paranasal sinuses were obtained in a single plane without contrast.  Comparison:  02/19/2005.  Findings:  The patient was not able to perform coronal imaging. Axial imaging performed.  Prominence of the right internal carotid artery cavernous segment. Although this may represent ectasia, aneurysm not excluded (series 3 image 13).  Hyperostosis frontalis interna asymmetric greater on the left.  Motion degraded exam.  Paranasal sinuses are clear.  Minimal partial opacification inferior mastoid air cells. No obvious mass causing eustachian tube dysfunction noted.  IMPRESSION:  Motion degraded exam.  Paranasal sinuses are clear.  Minimal partial opacification inferior mastoid air cells.  Prominence of the right internal carotid artery cavernous segment. Although this may represent ectasia, aneurysm not excluded (series 3 image 13).  Original Report Authenticated By: Fuller Canada, M.D.     Assessment & Plan   PFT 12/31/11>>FEV1 0.83 (21%), FEV1% 39, DLCO 28, + BD  AECOPD with hx of GOLD 4 COPD and continued tobacco abuse>>improved.  Some of his symptoms likely related to upper airway irritation from post-nasal drip and reflux. P: Wean prednisone off over next 5 days Will need to arrange for  home nebulizer Will need to arrange for pulmicort, albuterol, and atrovent via nebulizer Will not resume symbicort or tudorza as outpt since he will be using nebulized medicines D 6/7 avelox Encouraged him to continue with his smoking cessation efforts Continue bronchial hygiene regimen  Acute on chronic hypoxic respiratory failure. P: Home oxygen has been arranged through [facility]  Acute on chronic sinusitis>>improved. P: Continue flonase and saline nasal spray Change afrin to PRN and limit  use Continue singulair Continue avelox  GERD as contributor to upper airway irritation>>improved. P: Continue BID protonix D/c'ed fish oil  Probable OSA. P: Will need further assessment as outpt  Hypertension. P: Cozaar held 8/12 due to elevated creatinine Continue clonidine and then re-assess as outpt with PCP  Hx of insomnia. P: He can resume trazodone as outpt and f/u with PCP  Hx of anxiety. P: Continue prozac and f/u with PCP  Urine retention. P: Can resume hytrin and f/u with PCP   D/c home 8/14.  Will arrange for f/u in one week with Tammy Parrett, and then Dr. Marchelle Gearing after that.  Coralyn Helling, MD [facility] Pulmonary/Critical Care 07/09/2012, 10:27 AM Pager:  [phone] After 3pm call: [phone]

## 2012-07-15 ENCOUNTER — Inpatient Hospital Stay: Payer: 59 | Admitting: Adult Health

## 2012-07-23 ENCOUNTER — Encounter: Payer: Self-pay | Admitting: Adult Health

## 2012-07-23 ENCOUNTER — Ambulatory Visit (INDEPENDENT_AMBULATORY_CARE_PROVIDER_SITE_OTHER): Payer: 59 | Admitting: Adult Health

## 2012-07-23 VITALS — BP 128/88 | HR 71 | Temp 96.9°F | Ht 71.0 in | Wt 257.0 lb

## 2012-07-23 DIAGNOSIS — Z23 Encounter for immunization: Secondary | ICD-10-CM

## 2012-07-23 DIAGNOSIS — J441 Chronic obstructive pulmonary disease with (acute) exacerbation: Secondary | ICD-10-CM

## 2012-07-23 MED ORDER — BUDESONIDE 0.5 MG/2ML IN SUSP: 0.5000 mg | Freq: Two times a day (BID) | RESPIRATORY_TRACT | Status: DC

## 2012-07-23 MED ORDER — IPRATROPIUM BROMIDE 0.02 % IN SOLN: 0.5000 mg | Freq: Four times a day (QID) | RESPIRATORY_TRACT | Status: DC

## 2012-07-23 MED ORDER — ALBUTEROL SULFATE (2.5 MG/3ML) 0.083% IN NEBU: 2.5000 mg | INHALATION_SOLUTION | Freq: Four times a day (QID) | RESPIRATORY_TRACT | Status: DC

## 2012-07-23 MED ORDER — ALBUTEROL SULFATE HFA 108 (90 BASE) MCG/ACT IN AERS: 2.0000 | INHALATION_SPRAY | Freq: Four times a day (QID) | RESPIRATORY_TRACT | Status: DC | PRN

## 2012-07-23 NOTE — Addendum Note (Signed)
Addended by: Boone Master E on: 07/23/2012 10:20 AM   Modules accepted: Orders

## 2012-07-23 NOTE — Assessment & Plan Note (Signed)
Recent flare now resolved  Improved compensation on neb regimen and AR regimen  Advised to wear O2 with act and At bedtime   Congratulated on smoking cessation

## 2012-07-23 NOTE — Addendum Note (Signed)
Addended by: Boone Master E on: 07/23/2012 05:40 PM   Modules accepted: Orders

## 2012-07-23 NOTE — Progress Notes (Signed)
Subjective:    Patient ID: Isaac [name], male    DOB: 02-22-1956, 56 y.o.   MRN: [id number]  HPI IOV 04/25/2012  PCP is [name], MD Body mass index is 38.05 kg/(m^2).  reports that he quit smoking about 4 months ago. His smoking use included Cigarettes. He has a 35 pack-year smoking history. He does not have any smokeless tobacco history on file.   Dyspnea several years., Progressive past several months but more so past one month. cOincides after quitting smoking followed by unspecified weight gain.. Class 3 dyspnea on exertion. Relieved by rest. No associated chst pain but has chest tightness. Denies associated cough, wheeze, orthopnea but does admit to associated classic sleep apnea symptoms, and chronic sinus congeston esp on left side. Reports sinus issues results in flares of acute bronchitis  Walk test in office : Pulse 88% at rest -> 1 lap x 185 feet - pulse ox 88% and HR 110 and very dyspneic, so stopped  CAT score - 34  CT sinus 2006   - IMPRESSION:  1. Ethmoid and frontal sinus disease. Sphenoid and maxillary sinuses are clear.  2. Patent ostiomeatal complexes.  Spirometry 12/31/11 done at [facility]- fev1 0.8L/21%, Rati 39, 23% BD response on fev1  CXR 12/24/11   Clear lung fields - personally revieweed  Nuclear med stress test  2013 -  - negative per hx. Done by Dr Jacinto Halim per hx   REC #COPD You might be having an acute flare of copd Take doxycycline 100mg  po twice daily x 8 days; take after meals and avoid sunlight Take prednisone 40 mg daily x 2 days, then 20mg  daily x 2 days, then 10mg  daily x 2 days, then 5mg  daily x 2 days and stop Continue tudorza twice daily Continue symbicort twice daily Nurse will walk you for oxygen levels We will refer you to pulmonary rehab At some point we will do alpha 1 genetic test for copd  #Posible sleep apnea We will set you up with referral to sleep doctor in practice  #Weight management  - when you return we can discuss diet  in detail; wil discuss [facility] low glycemic diet. Wife and he very interested in hearing about this  #Followoup  3 weeks with spirometry and CAT Score   07/23/2012 Post Hospital follow up  Returns for post hospital follow up.  Admitted 8/9-8/14 for AECOPD , tx w/ IV abx and steroids.  Discharged on o2 due to disats.  Discharged on Avelox which he has now finished.  Since discharge he is feeling better. Feels the nebs are so much better  Symbicort and [name] was d/c  Started on Pulmicort neb and duoneb Four times a day   Is planning on filing for disability.  No hemoptysis or chest pain .      Review of Systems  Constitutional:   No  weight loss, night sweats,  Fevers, chills,  +fatigue, or  lassitude.  HEENT:   No headaches,  Difficulty swallowing,  Tooth/dental problems, or  Sore throat,                No sneezing, itching, ear ache,  +nasal congestion, post nasal drip,   CV:  No chest pain,  Orthopnea, PND, swelling in lower extremities, anasarca, dizziness, palpitations, syncope.   GI  No heartburn, indigestion, abdominal pain, nausea, vomiting, diarrhea, change in bowel habits, loss of appetite, bloody stools.   Resp:    No coughing up of blood.  No chest wall deformity  Skin: no rash or lesions.  GU: no dysuria, change in color of urine, no urgency or frequency.  No flank pain, no hematuria   MS:  No joint pain or swelling.  No decreased range of motion.     Psych:  No change in mood or affect. No depression or anxiety.  No memory loss.          Objective:   Physical Exam  GEN: A/Ox3; pleasant , NAD, obese   HEENT:  [place]/AT,  EACs-clear, TMs-wnl, NOSE-clear drainage THROAT-clear, no lesions, no postnasal drip or exudate noted.   NECK:  Supple w/ fair ROM; no JVD; normal carotid impulses w/o bruits; no thyromegaly or nodules palpated; no lymphadenopathy.  RESP  Coarse BS  w/o, wheezes/ rales/ or rhonchi.no accessory muscle use, no dullness to  percussion  CARD:  RRR, no m/r/g  , tr  peripheral edema, pulses intact, no cyanosis or clubbing.  GI:   Soft & nt; nml bowel sounds; no organomegaly or masses detected.  Musco: Warm bil, no deformities or joint swelling noted.   Neuro: alert, no focal deficits noted.    Skin: Warm, no lesions or rashes             Assessment & Plan:

## 2012-07-23 NOTE — Patient Instructions (Addendum)
Continue on current regimen  Wear O2 at 2 L/M with activity and At bedtime   follow up Dr. Marchelle Gearing in 4 weeks and As needed   Pneumovax today

## 2012-07-31 ENCOUNTER — Ambulatory Visit
Admission: RE | Admit: 2012-07-31 | Discharge: 2012-07-31 | Disposition: A | Discharge status: Home / Self Care | Payer: 59 | Source: Ambulatory Visit | Attending: Internal Medicine | Admitting: Internal Medicine

## 2012-07-31 ENCOUNTER — Other Ambulatory Visit: Payer: Self-pay | Admitting: Internal Medicine

## 2012-07-31 DIAGNOSIS — I720 Aneurysm of carotid artery: Secondary | ICD-10-CM

## 2012-07-31 DIAGNOSIS — R609 Edema, unspecified: Secondary | ICD-10-CM

## 2012-07-31 MED ORDER — IOHEXOL 350 MG/ML SOLN
125.0000 mL | Freq: Once | INTRAVENOUS | Status: AC | PRN
  Administered 2012-07-31: 125 mL via INTRAVENOUS

## 2012-08-20 ENCOUNTER — Ambulatory Visit (INDEPENDENT_AMBULATORY_CARE_PROVIDER_SITE_OTHER): Payer: 59 | Admitting: Internal Medicine

## 2012-08-20 ENCOUNTER — Encounter: Payer: Self-pay | Admitting: Internal Medicine

## 2012-08-20 VITALS — BP 114/72 | HR 104 | Temp 98.5°F | Ht 71.0 in | Wt 262.4 lb

## 2012-08-20 DIAGNOSIS — J449 Chronic obstructive pulmonary disease, unspecified: Secondary | ICD-10-CM

## 2012-08-20 DIAGNOSIS — E669 Obesity, unspecified: Secondary | ICD-10-CM

## 2012-08-20 DIAGNOSIS — Z23 Encounter for immunization: Secondary | ICD-10-CM

## 2012-08-20 NOTE — Progress Notes (Signed)
Subjective:    Patient ID: Isaac [name], male    DOB: January 02, 1956, 56 y.o.   MRN: [id number]  HPI IOV 04/25/2012  PCP is [name], MD Body mass index is 38.05 kg/(m^2).  reports that he quit smoking about 4 months ago. His smoking use included Cigarettes. He has a 35 pack-year smoking history. He does not have any smokeless tobacco history on file.   Dyspnea several years., Progressive past several months but more so past one month. cOincides after quitting smoking followed by unspecified weight gain.. Class 3 dyspnea on exertion. Relieved by rest. No associated chst pain but has chest tightness. Denies associated cough, wheeze, orthopnea but does admit to associated classic sleep apnea symptoms, and chronic sinus congeston esp on left side. Reports sinus issues results in flares of acute bronchitis  Walk test in office : Pulse 88% at rest -> 1 lap x 185 feet - pulse ox 88% and HR 110 and very dyspneic, so stopped  CAT score - 34  CT sinus 2006   - IMPRESSION:  1. Ethmoid and frontal sinus disease. Sphenoid and maxillary sinuses are clear.  2. Patent ostiomeatal complexes.  Spirometry 12/31/11 done at [facility]- fev1 0.8L/21%, Rati 39, 23% BD response on fev1  CXR 12/24/11   Clear lung fields - personally revieweed  Nuclear med stress test  2013 -  - negative per hx. Done by Dr Jacinto Halim per hx   REC #COPD You might be having an acute flare of copd Take doxycycline 100mg  po twice daily x 8 days; take after meals and avoid sunlight Take prednisone 40 mg daily x 2 days, then 20mg  daily x 2 days, then 10mg  daily x 2 days, then 5mg  daily x 2 days and stop Continue tudorza twice daily Continue symbicort twice daily Nurse will walk you for oxygen levels We will refer you to pulmonary rehab At some point we will do alpha 1 genetic test for copd  #Posible sleep apnea We will set you up with referral to sleep doctor in practice  #Weight management  - when you return we can discuss diet  in detail; wil discuss [facility] low glycemic diet. Wife and he very interested in hearing about this  #Followoup  3 weeks with spirometry and CAT Score   07/23/2012 Post Hospital follow up  Returns for post hospital follow up.  Admitted 8/9-8/14 for AECOPD , tx w/ IV abx and steroids.  Discharged on o2 due to disats.  Discharged on Avelox which he has now finished.  Since discharge he is feeling better. Feels the nebs are so much better  Symbicort and [name] was d/c  Started on Pulmicort neb and duoneb Four times a day   Is planning on filing for disability.  No hemoptysis or chest pain .   Continue on current regimen  Wear O2 at 2 L/M with activity and At bedtime  follow up Dr. Marchelle Gearing in 4 weeks and As needed  Pneumovax today   OV 08/20/2012   Followup [facility] severe COPD and obesity: Presents as before with wife  Admitted 1 month ago for AECOPD. Now back to baseline which is class 3 dyspnea on exertion with good day and bad day variability. Dyspnea is always improved by rest. Asscoiated back pain compounds dyspnea. Also feels panic attacks making dyspnea worse. Non-adherent to oxygen: feels he will get dependent on it. Wants to get better without o2. He uses O2 more at home than at public places; embarrassed. Wants to exercise but  is scared to do on own. No call from pulmonary rehab yet. CAT score today is 24 a  He has not had but will have flu shot  He has had pneumovax  They do want to discuss weight management  CAT COPD Symptom & Quality of Life Score (GSK trademark) 0 is no burden. 5 is highest burden 08/20/2012   Never Cough -> Cough all the time 2  No phlegm in chest -> Chest is full of phlegm 2  No chest tightness -> Chest feels very tight 3  No dyspnea for 1 flight stairs/hill -> Very dyspneic for 1 flight of stairs 4  No limitations for ADL at home -> Very limited with ADL at home 3  Confident leaving home -> Not at all confident leaving home 3  Sleep soundly -> Do  not sleep soundly because of lung condition 3  Lots of Energy -> No energy at all 4  TOTAL Score (max 40)  24     Past, Family, Social reviewed: no change since last visit othre than down to 1 car   Current outpatient prescriptions:albuterol (PROVENTIL HFA;VENTOLIN HFA) 108 (90 BASE) MCG/ACT inhaler, Inhale 2 puffs into the lungs every 6 (six) hours as needed., Disp: 3 Inhaler, Rfl: 3;  albuterol (PROVENTIL) (2.5 MG/3ML) 0.083% nebulizer solution, Take 3 mLs (2.5 mg total) by nebulization 4 (four) times daily., Disp: 1080 mL, Rfl: 3;  benzonatate (TESSALON) 200 MG capsule, Take 1 capsule by mouth Three times daily as needed., Disp: , Rfl:  budesonide (PULMICORT) 0.5 MG/2ML nebulizer solution, Take 2 mLs (0.5 mg total) by nebulization 2 (two) times daily., Disp: 360 mL, Rfl: 3;  cloNIDine (CATAPRES) 0.2 MG tablet, Take 1 tablet (0.2 mg total) by mouth 3 (three) times daily., Disp: 90 tablet, Rfl: 6;  FLUoxetine (PROZAC) 10 MG capsule, Take 1 capsule (10 mg total) by mouth daily. occasionally, Disp: 30 capsule, Rfl: 6 fluticasone (FLONASE) 50 MCG/ACT nasal spray, Place 2 sprays into the nose daily., Disp: 16 g, Rfl: 6;  hydrochlorothiazide (MICROZIDE) 12.5 MG capsule, Take 1 tablet by mouth Daily., Disp: , Rfl: ;  ipratropium (ATROVENT) 0.02 % nebulizer solution, Take 2.5 mLs (0.5 mg total) by nebulization 4 (four) times daily., Disp: 900 mL, Rfl: 3;  montelukast (SINGULAIR) 10 MG tablet, Take 10 mg by mouth at bedtime., Disp: , Rfl:  omeprazole (PRILOSEC) 20 MG capsule, Take 2 capsules (40 mg total) by mouth daily. For GERD., Disp: 30 capsule, Rfl: 6;  traZODone (DESYREL) 50 MG tablet, Take 50 mg by mouth at bedtime as needed. For sleep., Disp: , Rfl:     Past Medical History  Diagnosis Date  . Hyperlipidemia   . Tobacco dependence   . Nephrolithiasis   . Hematuria   . COPD (chronic obstructive pulmonary disease)   . GERD (gastroesophageal reflux disease)   . Insomnia      Family History    Problem Relation Age of Onset  . Heart disease Father   . Stroke Mother   . Leukemia Brother      History   Social History  . Marital Status: Married    Spouse Name: N/A    Number of Children: N/A  . Years of Education: N/A   Occupational History  . unemployed    Social History Main Topics  . Smoking status: Former Smoker -- 1.0 packs/day for 35 years    Types: Cigarettes    Quit date: 11/27/2011  . Smokeless tobacco: Not on file  . Alcohol  Use: No  . Drug Use: No  . Sexually Active: Yes   Other Topics Concern  . Not on file   Social History Narrative  . No narrative on file     Allergies  Allergen Reactions  . Codeine Itching     Outpatient Prescriptions Prior to Visit  Medication Sig Dispense Refill  . albuterol (PROVENTIL HFA;VENTOLIN HFA) 108 (90 BASE) MCG/ACT inhaler Inhale 2 puffs into the lungs every 6 (six) hours as needed.  3 Inhaler  3  . albuterol (PROVENTIL) (2.5 MG/3ML) 0.083% nebulizer solution Take 3 mLs (2.5 mg total) by nebulization 4 (four) times daily.  1080 mL  3  . benzonatate (TESSALON) 200 MG capsule Take 1 capsule by mouth Three times daily as needed.      . budesonide (PULMICORT) 0.5 MG/2ML nebulizer solution Take 2 mLs (0.5 mg total) by nebulization 2 (two) times daily.  360 mL  3  . cloNIDine (CATAPRES) 0.2 MG tablet Take 1 tablet (0.2 mg total) by mouth 3 (three) times daily.  90 tablet  6  . FLUoxetine (PROZAC) 10 MG capsule Take 1 capsule (10 mg total) by mouth daily. occasionally  30 capsule  6  . fluticasone (FLONASE) 50 MCG/ACT nasal spray Place 2 sprays into the nose daily.  16 g  6  . ipratropium (ATROVENT) 0.02 % nebulizer solution Take 2.5 mLs (0.5 mg total) by nebulization 4 (four) times daily.  900 mL  3  . montelukast (SINGULAIR) 10 MG tablet Take 10 mg by mouth at bedtime.      Marland Kitchen omeprazole (PRILOSEC) 20 MG capsule Take 2 capsules (40 mg total) by mouth daily. For GERD.  30 capsule  6  . traZODone (DESYREL) 50 MG tablet Take  50 mg by mouth at bedtime as needed. For sleep.          Review of Systems  Constitutional: Negative for fever and unexpected weight change.  HENT: Negative for ear pain, nosebleeds, congestion, sore throat, rhinorrhea, sneezing, trouble swallowing, dental problem, postnasal drip and sinus pressure.   Eyes: Negative for redness and itching.  Respiratory: Positive for chest tightness and shortness of breath. Negative for cough and wheezing.   Cardiovascular: Negative for palpitations and leg swelling.  Gastrointestinal: Negative for nausea and vomiting.  Genitourinary: Negative for dysuria.  Musculoskeletal: Negative for joint swelling.  Skin: Negative for rash.  Neurological: Negative for headaches.  Hematological: Does not bruise/bleed easily.  Psychiatric/Behavioral: Negative for dysphoric mood. The patient is nervous/anxious.        Objective:   Physical Exam GEN: A/Ox3; pleasant , NAD, obese   HEENT:  [place]/AT,  EACs-clear, TMs-wnl, NOSE-clear drainage THROAT-clear, no lesions, no postnasal drip or exudate noted.   NECK:  Supple w/ fair ROM; no JVD; normal carotid impulses w/o bruits; no thyromegaly or nodules palpated; no lymphadenopathy.  RESP  Coarse BS  w/o, wheezes/ rales/ or rhonchi.no accessory muscle use, no dullness to percussion  CARD:  RRR, no m/r/g  , tr  peripheral edema, pulses intact, no cyanosis or clubbing.  GI:   Soft & nt; nml bowel sounds; no organomegaly or masses detected.  Musco: Warm bil, no deformities or joint swelling noted.   Neuro: alert, no focal deficits noted.    Skin: Warm, no lesions or rashes         Assessment & Plan:

## 2012-08-20 NOTE — Assessment & Plan Note (Signed)
COPD  - conitnue your medications  - you need to use your oxygen  - have flu shot 08/20/2012 today - I have referred and contacted pulmonary rehabilitation: absolutely needed - at a future visit will check alpha 1 genetic test for copd - adequte rehab and weight mmgt are needed in long run to consider treatment options like lung transplant

## 2012-08-20 NOTE — Assessment & Plan Note (Signed)
#  Weight management   Return in 2 weeks just to discuss weight management For the 3 days before your visit, make a food diary and bring it  #Followup  2 weeks for discussing weight manag

## 2012-08-20 NOTE — Patient Instructions (Addendum)
#  COPD  - conitnue your medications  - you need to use your oxygen  - have flu shot 08/20/2012 today - I have referred and contacted pulmonary rehabilitation: absolutely needed - at a future visit will check alpha 1 genetic test for copd - adequte rehab and weight mmgt are needed in long run to consider treatment options like lung transplant  #Weight management   Return in 2 weeks just to discuss weight management For the 3 days before your visit, make a food diary and bring it  #Followup  2 weeks for discussing weight managment

## 2012-09-01 ENCOUNTER — Encounter (HOSPITAL_COMMUNITY)
Admission: RE | Admit: 2012-09-01 | Discharge: 2012-09-01 | Disposition: A | Discharge status: Home / Self Care | Payer: 59 | Source: Ambulatory Visit | Attending: Internal Medicine | Admitting: Internal Medicine

## 2012-09-01 ENCOUNTER — Encounter (HOSPITAL_COMMUNITY): Payer: Self-pay

## 2012-09-01 DIAGNOSIS — J4489 Other specified chronic obstructive pulmonary disease: Secondary | ICD-10-CM | POA: Insufficient documentation

## 2012-09-01 DIAGNOSIS — Z87891 Personal history of nicotine dependence: Secondary | ICD-10-CM | POA: Insufficient documentation

## 2012-09-01 DIAGNOSIS — J449 Chronic obstructive pulmonary disease, unspecified: Secondary | ICD-10-CM | POA: Insufficient documentation

## 2012-09-01 DIAGNOSIS — Z5189 Encounter for other specified aftercare: Secondary | ICD-10-CM | POA: Insufficient documentation

## 2012-09-01 HISTORY — DX: Essential (primary) hypertension: I10

## 2012-09-01 NOTE — Progress Notes (Signed)
Mr. Isaac [name] came in today, accompanied by his wife,  for orientation to Pulmonary Rehab. Medical history and medications were reviewed and nurse assessment done.  Goals were set for Rehab.  Demonstration and practice of PLB using pulse oximeter.  Patient able to return demonstration satisfactorily. Safety and hand hygiene in the exercise area reviewed with patient.  Patient voices understanding.  6 min walk test done, 320 feet, oxygen saturations 94 to 90 %  , increased to 3L at 3 min.  He plans to start exercise on 09/04/2012.  We look forward to working with this nice gentleman to improve his QOL.

## 2012-09-02 ENCOUNTER — Encounter (HOSPITAL_COMMUNITY): Payer: 59

## 2012-09-04 ENCOUNTER — Encounter (HOSPITAL_COMMUNITY): Payer: 59

## 2012-09-04 ENCOUNTER — Encounter (HOSPITAL_COMMUNITY)
Admission: RE | Admit: 2012-09-04 | Discharge: 2012-09-04 | Disposition: A | Discharge status: Home / Self Care | Payer: 59 | Source: Ambulatory Visit | Attending: Internal Medicine | Admitting: Internal Medicine

## 2012-09-05 ENCOUNTER — Ambulatory Visit (INDEPENDENT_AMBULATORY_CARE_PROVIDER_SITE_OTHER): Payer: 59 | Admitting: Internal Medicine

## 2012-09-05 ENCOUNTER — Encounter: Payer: Self-pay | Admitting: Internal Medicine

## 2012-09-05 VITALS — BP 102/70 | HR 80 | Temp 98.4°F | Ht 71.0 in | Wt 272.6 lb

## 2012-09-05 DIAGNOSIS — E669 Obesity, unspecified: Secondary | ICD-10-CM

## 2012-09-05 NOTE — Assessment & Plan Note (Signed)
#WEight Management    - we discussed extensively about weight management   - follow low glycemic diet plan that I outlined for you after extensive discussion. Do not follow other plans  - General  - drink lot of water  - avoid all moderate and high glycemic foods especially bad fruits, breads, pastas, fried foods, battered foods, sugary foods  - make non-starchy vegetables your base in terms of volume you eat  - always make sure you balance good carbs, good protein and good fat source  - good carbs are non-starchy vegetables, uncanned beans in the left column and low glycemic fruits in the left colum  - good protein source is egg white, beans, tofu, fish, chicken breast, fish, [place] and bison. Remember meat has to be skinless  - good healthy fat source is nuts, and fish   - focusing on eating right healthy foods (left lane) and avoiding unhealthy foods (middle and right lane) is better way to lose weight than to go hypo-caloric  - focus on staying full by eating right  - having a daily and weekly plan for what you will eat and where you will eat depending on your work, social life schedule is very important   - watch out for misleading labels on grocery aisle: High Fiber and Low Fat labeled foods generally are high in bad carbs or sugar  - measure weight once  a week  - discipline and attitude is key. Do not care for anyone else's opinion or feelings. Only yours matters   - For breakfast  - most important meal of the day. So, eat daily breakfast. Do not skip   - recommend 1/2 to 1 cup steel cut oat meal or 1/2 to 1 cup fiber one 60 cal   Or  1 to 1.5 cups Kashi go-lean with non-fat plain milk or 60 Cal Silk Soy mild. Can add Berries. Can have egg at same time for breakfast  - For snacks  - recommend total 2-3 snacks per day  - snack should be light and filling  - best times are between breakfast and lunch, lunch and dinner and sometimes post-dinner snack  - Nut are great snacks. Stick to  low glycemic nuts (less than 50gm per day) and eat only the nuts in the left lane like peanuts, pista, almond, walnut  - Low glycemic fruits are great snacks. Have 1-2 servings each day of fruits from the left lane   - If you like yogurt or cottage cheese - recommend Oikos or Fage 0% greek yogourt or Plan non-fat yogurt or [place] non-fat cottage cheese. Theyse have the least sugar. Do no exceed 100-200 gram per day. Fruit yogurts are the worst  - For Lunch and dinner  - unlimited non-starchy vegetable (prefer raw fresh or roasted or grilled) with skinless chicken or fish  - Special Notes  - Nuts: Nut are great snacks and have heart benefits. Stick to low glycemic nuts (less than 50gm per day) and eat only the nuts in the left lane like peanuts, pista, almond, walnuts  -  Ok to eat above nuts daily but only < 50gm/day  - If you eat more than 50gm/day then you run risk of eating too many calories or saturated fat  - AVoid nuts glazed with sugar. Nuts have to be in salted/original form or roasted   - Fruits: Eat 1-2 fresh fruit servings daily but fruits can be dangerous because of high sugar content. So, choose your fruits  wisely. Eat only the low glycemic fruits (left lane). Eat them fresh.  Do not eat them canned  - Avoid all fresh juices except if you use the low glycemic fruits and make them yourself without adding extra sugar   - Dairy: Is optional. Eat zero fat or low fat, fruit free yogurts or cottage cheese but not more than 100-200g per day  - Restaurant  - all restaurants have bad and good choices. Even fast food restaurants offer you good choices  - at restaurants do no fall prey to social pressure.. One way to eat healthy at restaurant is to eat healthy snack or light healthy meal before you go to restaurant so that will prevent your cravings  - Restaurants with worst choices: [place] (except [employer], or [employer]), [place], [place]. At these restaurants avoid the bread, curry,  fried and battered foods and chips  - Restaurants with best choices: greek, [place], [place], [place] (again here avoid bread, deep fried stuffed and pasta)  - Restaurants with Ok choice: [employer], [employer], [employer] (again here avoid the bread, fried stuff, fried meat)  - Always ask for grilled meat or vegetables, and fresh salad choices (get your salad dressing as low fat and to the side)   FOLLOWUP   3 months or sooner if needed  - weight loss goal 6 pounds in 3 months   > 50% of this > 25 min visit spent in face to face counseling (15 min visit converted to 25 min)

## 2012-09-05 NOTE — Patient Instructions (Addendum)
#WEight Management    - we discussed extensively about weight management   - follow low glycemic diet plan that I outlined for you after extensive discussion. Do not follow other plans  - General  - drink lot of water  - avoid all moderate and high glycemic foods especially bad fruits, breads, pastas, fried foods, battered foods, sugary foods  - make non-starchy vegetables your base in terms of volume you eat  - always make sure you balance good carbs, good protein and good fat source  - good carbs are non-starchy vegetables, uncanned beans in the left column and low glycemic fruits in the left colum  - good protein source is egg white, beans, tofu, fish, chicken breast, fish, [place] and bison. Remember meat has to be skinless  - good healthy fat source is nuts, and fish   - focusing on eating right healthy foods (left lane) and avoiding unhealthy foods (middle and right lane) is better way to lose weight than to go hypo-caloric  - focus on staying full by eating right  - having a daily and weekly plan for what you will eat and where you will eat depending on your work, social life schedule is very important   - watch out for misleading labels on grocery aisle: High Fiber and Low Fat labeled foods generally are high in bad carbs or sugar  - measure weight once  a week  - discipline and attitude is key. Do not care for anyone else's opinion or feelings. Only yours matters   - For breakfast  - most important meal of the day. So, eat daily breakfast. Do not skip   - recommend 1/2 to 1 cup steel cut oat meal or 1/2 to 1 cup fiber one 60 cal   Or  1 to 1.5 cups Kashi go-lean with non-fat plain milk or 60 Cal Silk Soy mild. Can add Berries. Can have egg at same time for breakfast  - For snacks  - recommend total 2-3 snacks per day  - snack should be light and filling  - best times are between breakfast and lunch, lunch and dinner and sometimes post-dinner snack  - Nut are great snacks. Stick to  low glycemic nuts (less than 50gm per day) and eat only the nuts in the left lane like peanuts, pista, almond, walnut  - Low glycemic fruits are great snacks. Have 1-2 servings each day of fruits from the left lane   - If you like yogurt or cottage cheese - recommend [place] or Fage 0% greek yogourt or Plan non-fat yogurt or [place] non-fat cottage cheese. Theyse have the least sugar. Do no exceed 100-200 gram per day. Fruit yogurts are the worst  - For Lunch and dinner  - unlimited non-starchy vegetable (prefer raw fresh or roasted or grilled) with skinless chicken or fish  - Special Notes  - Nuts: Nut are great snacks and have heart benefits. Stick to low glycemic nuts (less than 50gm per day) and eat only the nuts in the left lane like peanuts, pista, almond, walnuts  -  Ok to eat above nuts daily but only < 50gm/day  - If you eat more than 50gm/day then you run risk of eating too many calories or saturated fat  - AVoid nuts glazed with sugar. Nuts have to be in salted/original form or roasted   - Fruits: Eat 1-2 fresh fruit servings daily but fruits can be dangerous because of high sugar content. So, choose your fruits  wisely. Eat only the low glycemic fruits (left lane). Eat them fresh.  Do not eat them canned  - Avoid all fresh juices except if you use the low glycemic fruits and make them yourself without adding extra sugar   - Dairy: Is optional. Eat zero fat or low fat, fruit free yogurts or cottage cheese but not more than 100-200g per day  - Restaurant  - all restaurants have bad and good choices. Even fast food restaurants offer you good choices  - at restaurants do no fall prey to social pressure.. One way to eat healthy at restaurant is to eat healthy snack or light healthy meal before you go to restaurant so that will prevent your cravings  - Restaurants with worst choices: [place] (except [employer], or [employer]), [place], [place]. At these restaurants avoid the bread, curry,  fried and battered foods and chips  - Restaurants with best choices: greek, [place], [place], [place] (again here avoid bread, deep fried stuffed and pasta)  - Restaurants with Ok choice: McDonald's, [employer], [employer] (again here avoid the bread, fried stuff, fried meat)  - Always ask for grilled meat or vegetables, and fresh salad choices (get your salad dressing as low fat and to the side)   FOLLOWUP   3 months or sooner if needed  - weight loss goal 6 pounds in 3 months

## 2012-09-05 NOTE — Progress Notes (Signed)
Subjective:    Patient ID: Isaac [name], male    DOB: [birth year], 56 y.o.   MRN: [id number]  HPI IOV 04/25/2012  PCP is [name], MD Body mass index is 38.05 kg/(m^2).  reports that he quit smoking about 4 months ago. His smoking use included Cigarettes. He has a 35 pack-year smoking history. He does not have any smokeless tobacco history on file.   Dyspnea several years., Progressive past several months but more so past one month. cOincides after quitting smoking followed by unspecified weight gain.. Class 3 dyspnea on exertion. Relieved by rest. No associated chst pain but has chest tightness. Denies associated cough, wheeze, orthopnea but does admit to associated classic sleep apnea symptoms, and chronic sinus congeston esp on left side. Reports sinus issues results in flares of acute bronchitis  Walk test in office : Pulse 88% at rest -> 1 lap x 185 feet - pulse ox 88% and HR 110 and very dyspneic, so stopped  CAT score - 34  CT sinus 2006   - IMPRESSION:  1. Ethmoid and frontal sinus disease. Sphenoid and maxillary sinuses are clear.  2. Patent ostiomeatal complexes.  Spirometry 12/31/11 done at [facility]- fev1 0.8L/21%, Rati 39, 23% BD response on fev1  CXR 12/24/11   Clear lung fields - personally revieweed  Nuclear med stress test  2013 -  - negative per hx. Done by Dr Jacinto Halim per hx   REC #COPD You might be having an acute flare of copd Take doxycycline 100mg  po twice daily x 8 days; take after meals and avoid sunlight Take prednisone 40 mg daily x 2 days, then 20mg  daily x 2 days, then 10mg  daily x 2 days, then 5mg  daily x 2 days and stop Continue tudorza twice daily Continue symbicort twice daily Nurse will walk you for oxygen levels We will refer you to pulmonary rehab At some point we will do alpha 1 genetic test for copd  #Posible sleep apnea We will set you up with referral to sleep doctor in practice  #Weight management  - when you return we can discuss diet  in detail; wil discuss [facility] low glycemic diet. Wife and he very interested in hearing about this  #Followoup  3 weeks with spirometry and CAT Score   07/23/2012 Post Hospital follow up  Returns for post hospital follow up.  Admitted 8/9-8/14 for AECOPD , tx w/ IV abx and steroids.  Discharged on o2 due to disats.  Discharged on Avelox which he has now finished.  Since discharge he is feeling better. Feels the nebs are so much better  Symbicort and [name] was d/c  Started on Pulmicort neb and duoneb Four times a day   Is planning on filing for disability.  No hemoptysis or chest pain .   Continue on current regimen  Wear O2 at 2 L/M with activity and At bedtime  follow up Dr. Marchelle Gearing in 4 weeks and As needed  Pneumovax today   OV 08/20/2012   Followup verey severe COPD and obesity: Presents as before with wife  Admitted 1 month ago for AECOPD. Now back to baseline which is class 3 dyspnea on exertion with good day and bad day variability. Dyspnea is always improved by rest. Asscoiated back pain compounds dyspnea. Also feels panic attacks making dyspnea worse. Non-adherent to oxygen: feels he will get dependent on it. Wants to get better without o2. He uses O2 more at home than at public places; embarrassed. Wants to exercise but  is scared to do on own. No call from pulmonary rehab yet. CAT score today is 24 a  He has not had but will have flu shot  He has had pneumovax  They do want to discuss weight management  CAT COPD Symptom & Quality of Life Score (GSK trademark) 0 is no burden. 5 is highest burden 08/20/2012   Never Cough -> Cough all the time 2  No phlegm in chest -> Chest is full of phlegm 2  No chest tightness -> Chest feels very tight 3  No dyspnea for 1 flight stairs/hill -> Very dyspneic for 1 flight of stairs 4  No limitations for ADL at home -> Very limited with ADL at home 3  Confident leaving home -> Not at all confident leaving home 3  Sleep soundly -> Do  not sleep soundly because of lung condition 3  Lots of Energy -> No energy at all 4  TOTAL Score (max 40)  24     Past, Family, Social reviewed: no change since last visit othre than down to 1 car   #COPD  - conitnue your medications  - you need to use your oxygen  - have flu shot 08/20/2012 today  - I have referred and contacted pulmonary rehabilitation: absolutely needed  - at a future visit will check alpha 1 genetic test for copd  - adequte rehab and weight mmgt are needed in long run to consider treatment options like lung transplant  #Weight management  Return in 2 weeks just to discuss weight management  For the 3 days before your visit, make a food diary and bring it  #Followup  2 weeks for discussing weight managment   OV 09/05/2012  To discuss weight management  Estimated Body mass index is 38.02 kg/(m^2) as calculated from the following:   Height as of this encounter: 5\' 11" (1.803 m).   Weight as of this encounter: 272 lb 9.6 oz(123.651 kg).   Food diary past 3 days-  showed high in moderate and high glycemic foods - cookie crackers, spaghetti, sandwich, veggie fries, grapes, waffles, sandwich, large slaw, dr [name]. Discused their food intake and he does eat vegetables but low in amount. He does not eat breakfast. In addition, he drinks lot of fruit juices high in sugar, has instant rice, loves chinese foods and eats biscuits and drinks DR Pepper (not even the diet form). The beans he has is all canned beans and he eats lot of bread. The yogurt he has has fruit on bottom and high in sugar.   He seldom eats low glyucemic fruits or nuts. Skips breakfast except on weekends when he has it with sausage and bacon.    Review of Systems  Constitutional: Negative for fever, chills, diaphoresis, activity change, appetite change, fatigue and unexpected weight change.  HENT: Negative for hearing loss, ear pain, nosebleeds, congestion, sore throat, facial swelling, rhinorrhea,  sneezing, mouth sores, trouble swallowing, neck pain, neck stiffness, dental problem, voice change, postnasal drip, sinus pressure, tinnitus and ear discharge.   Eyes: Negative for photophobia, discharge, itching and visual disturbance.  Respiratory: Negative for apnea, cough, choking, chest tightness, shortness of breath, wheezing and stridor.   Cardiovascular: Negative for chest pain, palpitations and leg swelling.  Gastrointestinal: Negative for nausea, vomiting, abdominal pain, constipation, blood in stool and abdominal distention.  Genitourinary: Negative for dysuria, urgency, frequency, hematuria, flank pain, decreased urine volume and difficulty urinating.  Musculoskeletal: Negative for myalgias, back pain, joint swelling, arthralgias and gait problem.  Skin: Negative for color change, pallor and rash.  Neurological: Negative for dizziness, tremors, seizures, syncope, speech difficulty, weakness, light-headedness, numbness and headaches.  Hematological: Negative for adenopathy. Does not bruise/bleed easily.  Psychiatric/Behavioral: Negative for confusion, disturbed wake/sleep cycle and agitation. The patient is not nervous/anxious.        Objective:   Physical Exam GEN: A/Ox3; pleasant , NAD, obese    Discussion only visit         Assessment & Plan:

## 2012-09-09 ENCOUNTER — Encounter (HOSPITAL_COMMUNITY): Payer: 59

## 2012-09-09 ENCOUNTER — Encounter (HOSPITAL_COMMUNITY)
Admission: RE | Admit: 2012-09-09 | Discharge: 2012-09-09 | Disposition: A | Discharge status: Home / Self Care | Payer: 59 | Source: Ambulatory Visit | Attending: Internal Medicine | Admitting: Internal Medicine

## 2012-09-11 ENCOUNTER — Encounter (HOSPITAL_COMMUNITY): Payer: 59

## 2012-09-11 ENCOUNTER — Encounter (HOSPITAL_COMMUNITY)
Admission: RE | Admit: 2012-09-11 | Discharge: 2012-09-11 | Disposition: A | Discharge status: Home / Self Care | Payer: 59 | Source: Ambulatory Visit | Attending: Internal Medicine | Admitting: Internal Medicine

## 2012-09-16 ENCOUNTER — Encounter (HOSPITAL_COMMUNITY)
Admission: RE | Admit: 2012-09-16 | Discharge: 2012-09-16 | Disposition: A | Discharge status: Home / Self Care | Payer: 59 | Source: Ambulatory Visit | Attending: Internal Medicine | Admitting: Internal Medicine

## 2012-09-16 ENCOUNTER — Encounter (HOSPITAL_COMMUNITY): Payer: 59

## 2012-09-18 ENCOUNTER — Encounter (HOSPITAL_COMMUNITY): Payer: 59

## 2012-09-18 ENCOUNTER — Encounter (HOSPITAL_COMMUNITY)
Admission: RE | Admit: 2012-09-18 | Discharge: 2012-09-18 | Disposition: A | Discharge status: Home / Self Care | Payer: 59 | Source: Ambulatory Visit | Attending: Internal Medicine | Admitting: Internal Medicine

## 2012-09-23 ENCOUNTER — Encounter (HOSPITAL_COMMUNITY)
Admission: RE | Admit: 2012-09-23 | Discharge: 2012-09-23 | Disposition: A | Discharge status: Home / Self Care | Payer: 59 | Source: Ambulatory Visit | Attending: Internal Medicine | Admitting: Internal Medicine

## 2012-09-23 ENCOUNTER — Encounter (HOSPITAL_COMMUNITY): Payer: 59

## 2012-09-23 NOTE — Progress Notes (Signed)
Isaac [name] 56 y.o. male Nutrition Note Spoke with pt. Pt is obese. Pt reports losing 10# over 1 month due to illness. Pt states "I've gained some of it back though." Pt trying to lose wt by making half his plate non-starchy vegetables and using smaller plates. There are some ways the patient can make his eating habits healthier.  Pt's Rate Your Plate results reviewed with pt. Pt eats out 4-5 times a week for "convenience." Per pt, "I just started making meals since I'm out of work." Pt reports his wife usually enjoys cooking so his wife typically does the grocery shopping. Pt recently started decreasing salty food consumed. Pt states he eats mostly fresh/frozen vegetables and uses low-sodium seasonings. The role of sodium in lung disease reviewed with pt. Per nutrition screen, pt c/o constipation. Pt states he feels constipation is improved now that he is drinking more water instead of soda and "have gotten used to some of the medications." Pt expressed understanding of information reviewed. Nutrition Diagnosis   Food-and nutrition-related knowledge deficit related to lack of exposure to information as related to diagnosis of pulmonary disease   Obesity related to excessive energy intake as evidenced by a BMI of 37.3 Nutrition Rx/Est. Daily Nutrition Needs for: ? wt loss 1850-2350 Kcal  100-120 gm protein   1500 mg or less sodium      Nutrition Intervention   Pt's individual nutrition plan and goals reviewed with pt.   Benefits of adopting healthy eating habits discussed when pt's Rate Your Plate reviewed.   Pt to attend the Nutrition and Lung Disease class   Continual client-centered nutrition education by RD, as part of interdisciplinary care. Goal(s) 1. Identify food quantities necessary to achieve wt loss of  -2# per week to a goal wt of 109.5-114.9 kg (240-252 lb) at graduation from pulmonary rehab. Monitor and Evaluate progress toward nutrition goal with team.   Nutrition Risk: Low

## 2012-09-25 ENCOUNTER — Encounter (HOSPITAL_COMMUNITY): Payer: 59

## 2012-09-25 ENCOUNTER — Encounter (HOSPITAL_COMMUNITY)
Admission: RE | Admit: 2012-09-25 | Discharge: 2012-09-25 | Disposition: A | Discharge status: Home / Self Care | Payer: 59 | Source: Ambulatory Visit | Attending: Internal Medicine | Admitting: Internal Medicine

## 2012-09-25 NOTE — Progress Notes (Signed)
Isaac [name] comes in today and weight is up over 7#s since 09/16/12.  Dr. Elmyra Ricks office called and message left with nurse.  They are to call patient. He says his breathing is ok today, better than Tuesday.  Very mild pitting edema.  Distant breath sounds. We will continue to monitor weight and dyspnea closely.

## 2012-09-30 ENCOUNTER — Encounter (HOSPITAL_COMMUNITY): Payer: 59

## 2012-09-30 ENCOUNTER — Encounter (HOSPITAL_COMMUNITY)
Admission: RE | Admit: 2012-09-30 | Discharge: 2012-09-30 | Disposition: A | Discharge status: Home / Self Care | Payer: 59 | Source: Ambulatory Visit | Attending: Internal Medicine | Admitting: Internal Medicine

## 2012-09-30 DIAGNOSIS — J449 Chronic obstructive pulmonary disease, unspecified: Secondary | ICD-10-CM | POA: Insufficient documentation

## 2012-09-30 DIAGNOSIS — J4489 Other specified chronic obstructive pulmonary disease: Secondary | ICD-10-CM | POA: Insufficient documentation

## 2012-09-30 DIAGNOSIS — Z5189 Encounter for other specified aftercare: Secondary | ICD-10-CM | POA: Insufficient documentation

## 2012-09-30 DIAGNOSIS — Z87891 Personal history of nicotine dependence: Secondary | ICD-10-CM | POA: Insufficient documentation

## 2012-09-30 NOTE — Progress Notes (Signed)
Isaac [name] 56 y.o. male Nutrition Note Spoke with pt and pt's wife. Pt and wife having difficulty following the low glycemic index diet because "it's too restrictive." Pt has stopped eating potato chips and is now eating almonds. Per pt, he is eating almonds "like they are candy. It's like a nervous habit or something." Caloric density of nuts discussed and lower-calorie alternatives offered. Pt and wife requested more direction with eating a healthy diet. Pt encouraged to attend Heart Healthy eating and Lifestyle Skills classes offered on Tuesdays. 1800 kcal diet discussed. Pt and pt wife expressed understanding.  Nutrition Diagnosis   Food-and nutrition-related knowledge deficit related to lack of exposure to information as related to diagnosis of pulmonary disease   Obesity related to excessive energy intake as evidenced by a BMI of 37.3 Nutrition Rx/Est. Daily Nutrition Needs for: ? wt loss 1850-2350 Kcal  100-120 gm protein   1500 mg or less sodium      Nutrition Intervention   Pt's individual nutrition plan reviewed with pt.   Handouts given for: Heart Healthy eating and Lifestyle Skills classes.   Pt to attend the Nutrition and Lung Disease class   Continual client-centered nutrition education by RD, as part of interdisciplinary care. Goal(s) 1. Identify food quantities necessary to achieve wt loss of  -2# per week to a goal wt of 109.5-114.9 kg (240-252 lb) at graduation from pulmonary rehab. Monitor and Evaluate progress toward nutrition goal with team.   Nutrition Risk: Low

## 2012-10-02 ENCOUNTER — Encounter (HOSPITAL_COMMUNITY)
Admission: RE | Admit: 2012-10-02 | Discharge: 2012-10-02 | Disposition: A | Discharge status: Home / Self Care | Payer: 59 | Source: Ambulatory Visit | Attending: Internal Medicine | Admitting: Internal Medicine

## 2012-10-02 ENCOUNTER — Encounter (HOSPITAL_COMMUNITY): Payer: 59

## 2012-10-06 ENCOUNTER — Encounter: Payer: Self-pay | Admitting: Gastroenterology

## 2012-10-07 ENCOUNTER — Encounter (HOSPITAL_COMMUNITY): Payer: 59

## 2012-10-07 ENCOUNTER — Encounter (HOSPITAL_COMMUNITY)
Admission: RE | Admit: 2012-10-07 | Discharge: 2012-10-07 | Disposition: A | Discharge status: Home / Self Care | Payer: 59 | Source: Ambulatory Visit | Attending: Internal Medicine | Admitting: Internal Medicine

## 2012-10-09 ENCOUNTER — Encounter (HOSPITAL_COMMUNITY)
Admission: RE | Admit: 2012-10-09 | Discharge: 2012-10-09 | Disposition: A | Discharge status: Home / Self Care | Payer: 59 | Source: Ambulatory Visit | Attending: Internal Medicine | Admitting: Internal Medicine

## 2012-10-09 ENCOUNTER — Encounter (HOSPITAL_COMMUNITY): Payer: 59

## 2012-10-14 ENCOUNTER — Encounter (HOSPITAL_COMMUNITY)
Admission: RE | Admit: 2012-10-14 | Discharge: 2012-10-14 | Disposition: A | Discharge status: Home / Self Care | Payer: 59 | Source: Ambulatory Visit | Attending: Internal Medicine | Admitting: Internal Medicine

## 2012-10-14 ENCOUNTER — Encounter (HOSPITAL_COMMUNITY): Payer: 59

## 2012-10-16 ENCOUNTER — Encounter (HOSPITAL_COMMUNITY): Payer: 59

## 2012-10-16 ENCOUNTER — Encounter (HOSPITAL_COMMUNITY)
Admission: RE | Admit: 2012-10-16 | Discharge: 2012-10-16 | Disposition: A | Discharge status: Home / Self Care | Payer: 59 | Source: Ambulatory Visit | Attending: Internal Medicine | Admitting: Internal Medicine

## 2012-10-21 ENCOUNTER — Encounter (HOSPITAL_COMMUNITY): Payer: 59

## 2012-10-21 ENCOUNTER — Encounter (HOSPITAL_COMMUNITY)
Admission: RE | Admit: 2012-10-21 | Discharge: 2012-10-21 | Disposition: A | Discharge status: Home / Self Care | Payer: 59 | Source: Ambulatory Visit | Attending: Internal Medicine | Admitting: Internal Medicine

## 2012-10-23 ENCOUNTER — Encounter (HOSPITAL_COMMUNITY): Payer: 59

## 2012-10-28 ENCOUNTER — Encounter (HOSPITAL_COMMUNITY): Payer: 59

## 2012-10-30 ENCOUNTER — Encounter (HOSPITAL_COMMUNITY): Payer: 59

## 2012-11-04 ENCOUNTER — Encounter (HOSPITAL_COMMUNITY): Payer: 59

## 2012-11-06 ENCOUNTER — Encounter (HOSPITAL_COMMUNITY): Payer: 59

## 2012-11-06 ENCOUNTER — Encounter (HOSPITAL_COMMUNITY)
Admission: RE | Admit: 2012-11-06 | Discharge: 2012-11-06 | Disposition: A | Discharge status: Home / Self Care | Payer: 59 | Source: Ambulatory Visit | Attending: Internal Medicine | Admitting: Internal Medicine

## 2012-11-06 DIAGNOSIS — J449 Chronic obstructive pulmonary disease, unspecified: Secondary | ICD-10-CM | POA: Insufficient documentation

## 2012-11-06 DIAGNOSIS — Z87891 Personal history of nicotine dependence: Secondary | ICD-10-CM | POA: Insufficient documentation

## 2012-11-06 DIAGNOSIS — J4489 Other specified chronic obstructive pulmonary disease: Secondary | ICD-10-CM | POA: Insufficient documentation

## 2012-11-06 DIAGNOSIS — Z5189 Encounter for other specified aftercare: Secondary | ICD-10-CM | POA: Insufficient documentation

## 2012-11-06 NOTE — Progress Notes (Signed)
Isaac [name] returned to scheduled exercise today in Pulmonary Rehab.  He has been absent since 10/21/12 due to illness.  He was seen greater than one week ago by his primary MD and given a Z-Pak which he has finished.  Today he is visually more fatigued, more shortness of breath with activity, says he still has cough and coughing up mucus.  He only did arm crank for exercise today and admits to really not feeling well.  He is strongly advised to call his Pulmonary MD with symptoms to prevent worsening of this episode.  We will continue to encourage and support.  Cathie Olden RN

## 2012-11-10 ENCOUNTER — Telehealth: Payer: Self-pay | Admitting: Internal Medicine

## 2012-11-10 MED ORDER — DOXYCYCLINE HYCLATE 100 MG PO TABS: 100.0000 mg | ORAL_TABLET | Freq: Two times a day (BID) | ORAL | Status: DC

## 2012-11-10 MED ORDER — PREDNISONE 10 MG PO TABS: ORAL_TABLET | ORAL | Status: DC

## 2012-11-10 NOTE — Telephone Encounter (Signed)
I spoke with pt- c/o cough w/ green phlem, nasal congestion, PND, chest tx, facial pressure, slight increase SOB d/t nasal congestion, body aches x Friday. No f/c/s/n/v. He has been taking mucinex and using his flonase nasal spray. He is requesting ABX. Please advise MR thanks  Last OV 09/05/12  Allergies  Allergen Reactions  . Codeine Itching  . Codeine Sulfate

## 2012-11-10 NOTE — Telephone Encounter (Signed)
You might have an  attack of copd called COPD exacerbation Please take doxycycline 100mg  twice daily after meals x 5 days; avoid sunlight Please take prednisone 40mg  once daily x 3 days, then 20mg  once daily x 3 days, then 10mg  once daily x 3 days, then 5mg  once dailyx 3 days and stop   If meds expensive call us back  If gettingg worse go to ER

## 2012-11-10 NOTE — Telephone Encounter (Signed)
i spoke with pt and is aware of MR recs. He voiced his understanding and needed nothing further. rx's have been sent to the pharmacy.

## 2012-11-11 ENCOUNTER — Encounter (HOSPITAL_COMMUNITY)
Admission: RE | Admit: 2012-11-11 | Discharge: 2012-11-11 | Disposition: A | Discharge status: Home / Self Care | Payer: 59 | Source: Ambulatory Visit | Attending: Internal Medicine | Admitting: Internal Medicine

## 2012-11-11 ENCOUNTER — Encounter (HOSPITAL_COMMUNITY): Payer: 59

## 2012-11-13 ENCOUNTER — Encounter (HOSPITAL_COMMUNITY): Payer: 59

## 2012-11-13 ENCOUNTER — Encounter (HOSPITAL_COMMUNITY)
Admission: RE | Admit: 2012-11-13 | Discharge: 2012-11-13 | Disposition: A | Discharge status: Home / Self Care | Payer: 59 | Source: Ambulatory Visit | Attending: Internal Medicine | Admitting: Internal Medicine

## 2012-11-18 ENCOUNTER — Encounter (HOSPITAL_COMMUNITY): Payer: 59

## 2012-11-20 ENCOUNTER — Encounter (HOSPITAL_COMMUNITY): Payer: 59

## 2012-11-25 ENCOUNTER — Encounter (HOSPITAL_COMMUNITY): Payer: 59

## 2012-11-27 ENCOUNTER — Encounter (HOSPITAL_COMMUNITY): Payer: 59

## 2012-11-27 ENCOUNTER — Encounter (HOSPITAL_COMMUNITY)
Admission: RE | Admit: 2012-11-27 | Discharge: 2012-11-27 | Disposition: A | Discharge status: Home / Self Care | Payer: 59 | Source: Ambulatory Visit | Attending: Internal Medicine | Admitting: Internal Medicine

## 2012-11-27 ENCOUNTER — Encounter: Payer: 59 | Admitting: Gastroenterology

## 2012-11-27 DIAGNOSIS — Z5189 Encounter for other specified aftercare: Secondary | ICD-10-CM | POA: Insufficient documentation

## 2012-11-27 DIAGNOSIS — Z87891 Personal history of nicotine dependence: Secondary | ICD-10-CM | POA: Insufficient documentation

## 2012-11-27 DIAGNOSIS — J4489 Other specified chronic obstructive pulmonary disease: Secondary | ICD-10-CM | POA: Insufficient documentation

## 2012-11-27 DIAGNOSIS — J449 Chronic obstructive pulmonary disease, unspecified: Secondary | ICD-10-CM | POA: Insufficient documentation

## 2012-12-01 ENCOUNTER — Encounter: Payer: 59 | Admitting: Gastroenterology

## 2012-12-02 ENCOUNTER — Encounter (HOSPITAL_COMMUNITY): Payer: 59

## 2012-12-04 ENCOUNTER — Encounter (HOSPITAL_COMMUNITY)
Admission: RE | Admit: 2012-12-04 | Discharge: 2012-12-04 | Disposition: A | Discharge status: Home / Self Care | Payer: 59 | Source: Ambulatory Visit | Attending: Internal Medicine | Admitting: Internal Medicine

## 2012-12-04 ENCOUNTER — Encounter (HOSPITAL_COMMUNITY): Payer: 59

## 2012-12-08 ENCOUNTER — Encounter: Payer: Self-pay | Admitting: Internal Medicine

## 2012-12-08 ENCOUNTER — Other Ambulatory Visit: Payer: 59

## 2012-12-08 ENCOUNTER — Ambulatory Visit (INDEPENDENT_AMBULATORY_CARE_PROVIDER_SITE_OTHER): Payer: 59 | Admitting: Internal Medicine

## 2012-12-08 VITALS — BP 128/78 | HR 104 | Temp 98.4°F | Ht 71.0 in | Wt 285.2 lb

## 2012-12-08 DIAGNOSIS — J449 Chronic obstructive pulmonary disease, unspecified: Secondary | ICD-10-CM

## 2012-12-08 DIAGNOSIS — E669 Obesity, unspecified: Secondary | ICD-10-CM

## 2012-12-08 DIAGNOSIS — J4489 Other specified chronic obstructive pulmonary disease: Secondary | ICD-10-CM

## 2012-12-08 DIAGNOSIS — J441 Chronic obstructive pulmonary disease with (acute) exacerbation: Secondary | ICD-10-CM

## 2012-12-08 MED ORDER — DOXYCYCLINE HYCLATE 100 MG PO TABS: 100.0000 mg | ORAL_TABLET | Freq: Two times a day (BID) | ORAL | Status: DC

## 2012-12-08 MED ORDER — PREDNISONE 10 MG PO TABS: ORAL_TABLET | ORAL | Status: DC

## 2012-12-08 NOTE — Patient Instructions (Addendum)
#  AECOPD You have mild attack of copd called COPD exacerbation Please take doxycycline 100mg  twice daily after meals x 5 days; avoid sunlight (if antibiotic too pricey call us) Please take prednisone 40mg  once daily x 3 days, then 20mg  once daily x 3 days, then 10mg  once daily x 3 days, then 5mg  once dailyx 3 days and stop  #COPD  continue rehab, oxygen and medications  please have alpha 1 genetic test for copd; forgot to mention that  #Weight  - too bad you have gained weight   - here is sample meal scheme  - Breakfast 1 to 1.5 cups of whole stell cut oatmeal or kashi go lean or 60 cal Fiber one with Silk unsweetened soymilk or light chocolate/vanilla soymilk (1 cup) or low fat plain milk  -Lunch - vegetable salad FRom left lane) with grilled chicken or fish or lean beef and non fat dressing but no chips or tacos. Alternate is ATkins or [place] or Health choice frozen meal  - Dinner - vegetables  (from left lane) with grilled chicken or fish or lean beef and non fat dressing but no chips. Alternate is ATkins or [place] or Healthy choice frozen meal   - snacks - hummus, 50gm nut pack, light yogourt, plain greek yogurt 1 cup, cottage cheese, Carb Control Abbott Myoplex Shake 1 - 2 per day, Whey protein with non fat milk, Casein protein with non fat milk, low glycemic frutis ([place]/apple in left lane)   #Followup  - 3 months or sooner if needed

## 2012-12-08 NOTE — Progress Notes (Signed)
Subjective:    Patient ID: Isaac [name], male    DOB: [birth year], 57 y.o.   MRN: [id number]  HPI IOV 04/25/2012  PCP is [name], MD Body mass index is 38.05 kg/(m^2).  reports that he quit smoking about 4 months ago. His smoking use included Cigarettes. He has a 35 pack-year smoking history. He does not have any smokeless tobacco history on file.   Dyspnea several years., Progressive past several months but more so past one month. cOincides after quitting smoking followed by unspecified weight gain.. Class 3 dyspnea on exertion. Relieved by rest. No associated chst pain but has chest tightness. Denies associated cough, wheeze, orthopnea but does admit to associated classic sleep apnea symptoms, and chronic sinus congeston esp on left side. Reports sinus issues results in flares of acute bronchitis  Walk test in office : Pulse 88% at rest -> 1 lap x 185 feet - pulse ox 88% and HR 110 and very dyspneic, so stopped  CAT score - 34  CT sinus 2006   - IMPRESSION:  1. Ethmoid and frontal sinus disease. Sphenoid and maxillary sinuses are clear.  2. Patent ostiomeatal complexes.  Spirometry 12/31/11 done at [facility]- fev1 0.8L/21%, Rati 39, 23% BD response on fev1  CXR 12/24/11   Clear lung fields - personally revieweed  Nuclear med stress test  2013 -  - negative per hx. Done by Dr Jacinto Halim per hx   REC #COPD You might be having an acute flare of copd Take doxycycline 100mg  po twice daily x 8 days; take after meals and avoid sunlight Take prednisone 40 mg daily x 2 days, then 20mg  daily x 2 days, then 10mg  daily x 2 days, then 5mg  daily x 2 days and stop Continue tudorza twice daily Continue symbicort twice daily Nurse will walk you for oxygen levels We will refer you to pulmonary rehab At some point we will do alpha 1 genetic test for copd  #Posible sleep apnea We will set you up with referral to sleep doctor in practice  #Weight management  - when you return we can discuss diet  in detail; wil discuss [facility] low glycemic diet. Wife and he very interested in hearing about this  #Followoup  3 weeks with spirometry and CAT Score   07/23/2012 Post Hospital follow up  Returns for post hospital follow up.  Admitted 8/9-8/14 for AECOPD , tx w/ IV abx and steroids.  Discharged on o2 due to disats.  Discharged on Avelox which he has now finished.  Since discharge he is feeling better. Feels the nebs are so much better  Symbicort and [name] was d/c  Started on Pulmicort neb and duoneb Four times a day   Is planning on filing for disability.  No hemoptysis or chest pain .   Continue on current regimen  Wear O2 at 2 L/M with activity and At bedtime  follow up Dr. Marchelle Gearing in 4 weeks and As needed  Pneumovax today   OV 08/20/2012   Followup verey severe COPD and obesity: Presents as before with wife  Admitted 1 month ago for AECOPD. Now back to baseline which is class 3 dyspnea on exertion with good day and bad day variability. Dyspnea is always improved by rest. Asscoiated back pain compounds dyspnea. Also feels panic attacks making dyspnea worse. Non-adherent to oxygen: feels he will get dependent on it. Wants to get better without o2. He uses O2 more at home than at public places; embarrassed. Wants to exercise but  is scared to do on own. No call from pulmonary rehab yet. CAT score today is 24 a  He has not had but will have flu shot  He has had pneumovax  They do want to discuss weight management   Past, Family, Social reviewed: no change since last visit othre than down to 1 car   #COPD  - conitnue your medications  - you need to use your oxygen  - have flu shot 08/20/2012 today  - I have referred and contacted pulmonary rehabilitation: absolutely needed  - at a future visit will check alpha 1 genetic test for copd  - adequte rehab and weight mmgt are needed in long run to consider treatment options like lung transplant  #Weight management  Return in 2  weeks just to discuss weight management  For the 3 days before your visit, make a food diary and bring it  #Followup  2 weeks for discussing weight managment   OV 09/05/2012  To discuss weight management  Estimated Body mass index is 38.02 kg/(m^2) as calculated from the following:   Height as of this encounter: 5\' 11" (1.803 m).   Weight as of this encounter: 272 lb 9.6 oz(123.651 kg).   Food diary past 3 days-  showed high in moderate and high glycemic foods - cookie crackers, spaghetti, sandwich, veggie fries, grapes, waffles, sandwich, large slaw, dr [name]. Discused their food intake and he does eat vegetables but low in amount. He does not eat breakfast. In addition, he drinks lot of fruit juices high in sugar, has instant rice, loves chinese foods and eats biscuits and drinks DR Pepper (not even the diet form). The beans he has is all canned beans and he eats lot of bread. The yogurt he has has fruit on bottom and high in sugar.   He seldom eats low glyucemic fruits or nuts. Skips breakfast except on weekends when he has it with sausage and bacon.     FOLLOWUP  3 months or sooner if needed  - weight loss goal 6 pounds in 3 months   OV 12/08/2012  Followup #Obesit and #COPD. Presents as usual with wife [name]  #OBesity  - see below. He has gained weight. He and wife state that they never startred dietary advice I outlined. Each day they procrastinated. They think low glycemic diet is too complicated. THey think being in their 46s they cannot change. They also think that [employer] does not provide them the low glycemic foods. They want  A simple statement of what to eat for each meal.  Estimated Body mass index is 39.78 kg/(m^2) as calculated from the following:   Height as of this encounter: 5\' 11" (1.803 m).   Weight as of this encounter: 285 lb 3.2 oz(129.366 kg).  #COPD On 12/06/12: Left ear popped and since then increased dyspnea, wheeze. He is playing down symptoms  but CAT score shows clear worssening from24 to 30 (see below). Denies fever or colored psutum or orthopnea or edema. He is attending rehab. He has not had alpha 1 check   CAT COPD Symptom & Quality of Life Score (GSK trademark) 0 is no burden. 5 is highest burden 08/20/2012  12/08/2012   Never Cough -> Cough all the time 2 4  No phlegm in chest -> Chest is full of phlegm 2 4  No chest tightness -> Chest feels very tight 3 4  No dyspnea for 1 flight stairs/hill -> Very dyspneic for 1 flight of stairs 4  4  No limitations for ADL at home -> Very limited with ADL at home 3 4  Confident leaving home -> Not at all confident leaving home 3 3  Sleep soundly -> Do not sleep soundly because of lung condition 3 4  Lots of Energy -> No energy at all 4 3  TOTAL Score (max 40)  24 30     Review of Systems  Constitutional: Negative for fever and unexpected weight change.  HENT: Positive for ear pain, congestion and sinus pressure. Negative for nosebleeds, sore throat, rhinorrhea, sneezing, trouble swallowing, dental problem and postnasal drip.   Eyes: Negative for redness and itching.  Respiratory: Negative for cough, chest tightness, shortness of breath and wheezing.   Cardiovascular: Negative for palpitations and leg swelling.  Gastrointestinal: Negative for nausea and vomiting.  Genitourinary: Negative for dysuria.  Musculoskeletal: Negative for joint swelling.  Skin: Negative for rash.  Neurological: Negative for headaches.  Hematological: Does not bruise/bleed easily.  Psychiatric/Behavioral: Negative for dysphoric mood. The patient is not nervous/anxious.    Past, Family, Social reviewed: no change since last visit     Objective:   Physical Exam  Nursing note and vitals reviewed. Constitutional: He is oriented to person, place, and time. He appears well-developed and well-nourished. No distress.       Body mass index is 39.78 kg/(m^2).   HENT:  Head: Normocephalic and atraumatic.    Right Ear: External ear normal.  Left Ear: External ear normal.  Mouth/Throat: Oropharynx is clear and moist. No oropharyngeal exudate.       Left ear cone of light + but subdued Right ear cone of light normal Baseline xanthelsma +  Eyes: Conjunctivae normal and EOM are normal. Pupils are equal, round, and reactive to light. Right eye exhibits no discharge. Left eye exhibits no discharge. No scleral icterus.  Neck: Normal range of motion. Neck supple. No JVD present. No tracheal deviation present. No thyromegaly present.  Cardiovascular: Normal rate, regular rhythm and intact distal pulses.  Exam reveals no gallop and no friction rub.   No murmur heard. Pulmonary/Chest: Effort normal. No respiratory distress. He has wheezes. He has no rales. He exhibits no tenderness.       Worse purse lip breathing Scattered wheeze+  Abdominal: Soft. Bowel sounds are normal. He exhibits no distension and no mass. There is no tenderness. There is no rebound and no guarding.  Musculoskeletal: Normal range of motion. He exhibits no edema and no tenderness.  Lymphadenopathy:    He has no cervical adenopathy.  Neurological: He is alert and oriented to person, place, and time. He has normal reflexes. No cranial nerve deficit. Coordination normal.  Skin: Skin is warm and dry. No rash noted. He is not diaphoretic. No erythema. No pallor.  Psychiatric: He has a normal mood and affect. His behavior is normal. Judgment and thought content normal.          Assessment & Plan:

## 2012-12-09 ENCOUNTER — Encounter (HOSPITAL_COMMUNITY): Payer: 59

## 2012-12-09 ENCOUNTER — Encounter (HOSPITAL_COMMUNITY)
Admission: RE | Admit: 2012-12-09 | Discharge: 2012-12-09 | Disposition: A | Discharge status: Home / Self Care | Payer: 59 | Source: Ambulatory Visit

## 2012-12-09 NOTE — Assessment & Plan Note (Signed)
He and wife have several misconceptions about eating healthy. Chief of which I told them that healthy options are cheaper and can be available at routine grocery stores as opposed to expensive chains like fresh market, whole foods. Beyond that they do not seem motivated. Explained that addiction is a real issue and they have to face upto it andtake 3-6 months to form new habit if they are serious about health and that there are no short cuts. I laid out a simplified plan for them as below  #Weight  - too bad you have gained weight   - here is sample meal scheme  - Breakfast 1 to 1.5 cups of whole stell cut oatmeal or kashi go lean or 60 cal Fiber one with Silk unsweetened soymilk or light chocolate/vanilla soymilk (1 cup) or low fat plain milk  -Lunch - vegetable salad FRom left lane) with grilled chicken or fish or lean beef and non fat dressing but no chips or tacos. Alternate is ATkins or [place] or Health choice frozen meal  - Dinner - vegetables  (from left lane) with grilled chicken or fish or lean beef and non fat dressing but no chips. Alternate is ATkins or [place] or Healthy choice frozen meal   - snacks - hummus, 50gm nut pack, light yogourt, plain greek yogurt 1 cup, cottage cheese, Carb Control Abbott Myoplex Shake 1 - 2 per day, Whey protein with non fat milk, Casein protein with non fat milk, low glycemic frutis ([place]reis/apple in left lane)   #Followup  - 3 months or sooner if needed   > 50% of this > 25 min visit spent in face to face counseling (15 min visit converted to 25 min)

## 2012-12-09 NOTE — Assessment & Plan Note (Signed)
#  COPD  continue rehab, oxygen and medications  please have alpha 1 genetic test for copd; forgot to mention that

## 2012-12-09 NOTE — Assessment & Plan Note (Signed)
#  AECOPD You have mild attack of copd called COPD exacerbation Please take doxycycline 100mg  twice daily after meals x 5 days; avoid sunlight (if antibiotic too pricey call us) Please take prednisone 40mg  once daily x 3 days, then 20mg  once daily x 3 days, then 10mg  once daily x 3 days, then 5mg  once dailyx 3 days and stop

## 2012-12-11 ENCOUNTER — Encounter (HOSPITAL_COMMUNITY)
Admission: RE | Admit: 2012-12-11 | Discharge: 2012-12-11 | Disposition: A | Discharge status: Home / Self Care | Payer: 59 | Source: Ambulatory Visit | Attending: Internal Medicine | Admitting: Internal Medicine

## 2012-12-11 ENCOUNTER — Encounter (HOSPITAL_COMMUNITY): Payer: 59

## 2012-12-12 ENCOUNTER — Other Ambulatory Visit: Payer: Self-pay | Admitting: Internal Medicine

## 2012-12-13 LAB — ALPHA-1 ANTITRYPSIN PHENOTYPE: A-1 Antitrypsin: 155 mg/dL (ref 83–199)

## 2012-12-15 ENCOUNTER — Telehealth: Payer: Self-pay | Admitting: Internal Medicine

## 2012-12-15 NOTE — Telephone Encounter (Signed)
Made in error. Isaac [name]  °

## 2012-12-16 ENCOUNTER — Encounter (HOSPITAL_COMMUNITY): Payer: 59

## 2012-12-16 ENCOUNTER — Encounter (HOSPITAL_COMMUNITY)
Admission: RE | Admit: 2012-12-16 | Discharge: 2012-12-16 | Disposition: A | Discharge status: Home / Self Care | Payer: 59 | Source: Ambulatory Visit | Attending: Internal Medicine | Admitting: Internal Medicine

## 2012-12-18 ENCOUNTER — Encounter (HOSPITAL_COMMUNITY)
Admission: RE | Admit: 2012-12-18 | Discharge: 2012-12-18 | Disposition: A | Discharge status: Home / Self Care | Payer: 59 | Source: Ambulatory Visit | Attending: Internal Medicine | Admitting: Internal Medicine

## 2012-12-22 LAB — ALPHA-1 ANTITRYPSIN PHENOTYPE: A-1 Antitrypsin: 155 mg/dL (ref 83–199)

## 2012-12-23 ENCOUNTER — Encounter (HOSPITAL_COMMUNITY)
Admission: RE | Admit: 2012-12-23 | Discharge: 2012-12-23 | Disposition: A | Discharge status: Home / Self Care | Payer: 59 | Source: Ambulatory Visit | Attending: Internal Medicine | Admitting: Internal Medicine

## 2012-12-25 ENCOUNTER — Encounter (HOSPITAL_COMMUNITY): Payer: 59

## 2012-12-25 ENCOUNTER — Encounter (HOSPITAL_COMMUNITY)
Admission: RE | Admit: 2012-12-25 | Discharge: 2012-12-25 | Disposition: A | Discharge status: Home / Self Care | Payer: 59 | Source: Ambulatory Visit | Attending: Internal Medicine | Admitting: Internal Medicine

## 2012-12-30 ENCOUNTER — Encounter (HOSPITAL_COMMUNITY): Payer: 59

## 2012-12-30 ENCOUNTER — Encounter (HOSPITAL_COMMUNITY)
Admission: RE | Admit: 2012-12-30 | Discharge: 2012-12-30 | Disposition: A | Discharge status: Home / Self Care | Payer: 59 | Source: Ambulatory Visit | Attending: Internal Medicine | Admitting: Internal Medicine

## 2012-12-30 DIAGNOSIS — J449 Chronic obstructive pulmonary disease, unspecified: Secondary | ICD-10-CM | POA: Insufficient documentation

## 2012-12-30 DIAGNOSIS — J4489 Other specified chronic obstructive pulmonary disease: Secondary | ICD-10-CM | POA: Insufficient documentation

## 2012-12-30 DIAGNOSIS — Z5189 Encounter for other specified aftercare: Secondary | ICD-10-CM | POA: Insufficient documentation

## 2012-12-30 DIAGNOSIS — Z87891 Personal history of nicotine dependence: Secondary | ICD-10-CM | POA: Insufficient documentation

## 2012-12-31 ENCOUNTER — Encounter (HOSPITAL_COMMUNITY): Payer: Self-pay

## 2012-12-31 NOTE — Progress Notes (Signed)
Pulmonary Rehabilitation Program Outcomes Report   Orientation:  09/01/2012 Graduate Date:  12/30/2012 # of sessions completed: 25  Pulmonologist: Ramaswamy Class Time:1330  A.  Exercise Program:  Tolerates exercise @ 2.53 METS for 45 minutes, Walk Test Results:  Pre: 520 ft and Post: 1000 ft, Improved functional capacity  92.31 %, No Change  muscular strength, Improved dyspnea score 8.77 %, Improved education score 20.00 %, Exercise limited by dyspnea, Needs encouragement on exercise program, Discharged to home exercise program.  Anticipated compliance:  fair and Discharged  B.  Mental Health:  Health related anxiety and Quality of Life (QOL)  changes:  Overall  +28.41 %, Health/Functioning +76.51 %, Socioeconomics -2.54 %, Psych/Spiritual +8.10 %, Family +15.48 %    C.  Education/Instruction/Skills  Uses Perceived Exertion Scale and/or Dyspnea Scale and Attended 9 education classes  Demonstrates accurate diaphragmatic breathing and pursed lip breathing. Home exercise given on 09/11/2012  D.  Nutrition/Weight Control/Body Composition:  There is some room for improvement for pt diet to become healthier. Pt gave up regular sodas 2 weeks prior to discharge from program. Pt with unintended wt gain of 3.2 kg. BMI 38.3, % body fat 39.3%. Pt with 14.2%  increase in % body fat. Section Completed by: Mickle Plumb, M.Ed, RD, LDN, CDE   E.  Blood Lipids  No results found for this basename: CHOL, HDL, LDLCALC, LDLDIRECT, TRIG, CHOLHDL   F.  Lifestyle Changes:  Making positive lifestyle changes and Needs physician encouragement on making lifestyle changes  G.  Symptoms noted with exercise:  Shortness of breath, Dizziness, Fatigue and Exertional hypertension  Report Completed By:  Frederik Schmidt. Allred, MS, NASM,CES  Comments: Pt has graduated the pulmonary undergrad rehab program and did fairly well. Patient has shown much improvement in several areas. He has learned to  PLB and conserve energy with his exertion. He has a better outlook on life for his future. His overall QOL has improved. Pt seems to be satisfied with his final outcomes. He plans to continue exercise at the [facility] or call back to let us know he wants to join the Pulmonary maintenance program.

## 2013-01-01 ENCOUNTER — Encounter (HOSPITAL_COMMUNITY): Payer: 59

## 2013-01-06 ENCOUNTER — Encounter (HOSPITAL_COMMUNITY): Payer: 59

## 2013-01-08 ENCOUNTER — Encounter (HOSPITAL_COMMUNITY): Payer: 59

## 2013-01-13 ENCOUNTER — Encounter (HOSPITAL_COMMUNITY): Payer: 59

## 2013-01-15 ENCOUNTER — Encounter (HOSPITAL_COMMUNITY): Payer: 59

## 2013-01-20 ENCOUNTER — Encounter (HOSPITAL_COMMUNITY): Payer: 59

## 2013-01-22 ENCOUNTER — Encounter (HOSPITAL_COMMUNITY): Payer: 59

## 2013-02-04 ENCOUNTER — Telehealth: Payer: Self-pay | Admitting: Internal Medicine

## 2013-02-04 NOTE — Telephone Encounter (Signed)
ATC Renessa at [facility]. No answer. LMOMTCB

## 2013-02-05 NOTE — Telephone Encounter (Signed)
I spoke with Isaac [name] and advised I will keep an eye out for this form for the pt. Nothing further needed at this time. [name], CMA

## 2013-02-10 ENCOUNTER — Telehealth: Payer: Self-pay | Admitting: Internal Medicine

## 2013-02-10 NOTE — Telephone Encounter (Signed)
lmomtcb x1 for pt 

## 2013-02-10 NOTE — Telephone Encounter (Signed)
Per SN----  Use nasal saline every 1-2 hours while awake and take the medrol dosepak .  Pt will need to follow up with  Dr. Marchelle Gearing ASAP.  i have attempted to call the pt but no answer.  Will try back tomorrow.

## 2013-02-10 NOTE — Telephone Encounter (Signed)
Patient's wife calling back. °

## 2013-02-10 NOTE — Telephone Encounter (Signed)
I spoke with spouse. She stated pt c/o nasal congestion, PND, dry cough, can barely breathe the O2 through his nose d/t the congestion. No wheezing, chest tx. He is using flonase and afrin w/o relief. Requesting RX. MR is in 11 PM link. Please advise SN thanks Last OV 12/08/12 No pending OV  Allergies  Allergen Reactions  . Codeine Itching    "jittery"  . Codeine Sulfate   . Prozac (Fluoxetine Hcl)     confusion

## 2013-02-11 MED ORDER — METHYLPREDNISOLONE 4 MG PO KIT: PACK | ORAL | Status: DC

## 2013-02-11 NOTE — Telephone Encounter (Signed)
LMOM x 1 

## 2013-02-11 NOTE — Telephone Encounter (Signed)
I spoke with spouse and is aware of SN recs. RX has been sent. Appt has been made for pt. Will forward to MR as an FYI.

## 2013-02-19 ENCOUNTER — Telehealth: Payer: Self-pay | Admitting: Internal Medicine

## 2013-02-19 NOTE — Telephone Encounter (Signed)
Ranessa wanted to clarify the pt neb meds. She states he is taking duoneb four times a day scheduled and pulmicort bid. I advised according to med list and OV note this is correct. [name], CMA

## 2013-02-19 NOTE — Telephone Encounter (Signed)
LMTCBx1 with Lanessa sp? Nurse with [facility] care management team. [name], CMA

## 2013-03-06 ENCOUNTER — Ambulatory Visit: Payer: 59 | Admitting: Internal Medicine

## 2013-03-09 ENCOUNTER — Telehealth: Payer: Self-pay | Admitting: Internal Medicine

## 2013-03-09 MED ORDER — AMOXICILLIN-POT CLAVULANATE 875-125 MG PO TABS: 1.0000 | ORAL_TABLET | Freq: Two times a day (BID) | ORAL | Status: DC

## 2013-03-09 MED ORDER — PREDNISONE 10 MG PO TABS: ORAL_TABLET | ORAL | Status: DC

## 2013-03-09 NOTE — Telephone Encounter (Signed)
Pt aware and rx sent. Jennifer Castillo, CMA  

## 2013-03-09 NOTE — Telephone Encounter (Signed)
Last OV 12-08-12.MR COPD patient.  I spoke with the pt and he is c/o having sinus congestion, green mucus, chest congestion, dry cough, SOB x several days. Pt states it is hard to use his oxygen because his sinuses are so congested and this is making him more SOB. I advised the pt that he needs to come in for OV, pt refuses due to transportation issues, and there are no open slots today.  MR out of town so I will send message to doc-of-the-day. Please advise. [name], CMA Allergies  Allergen Reactions  . Codeine Itching    "jittery"  . Codeine Sulfate   . Prozac (Fluoxetine Hcl)     confusion

## 2013-03-09 NOTE — Telephone Encounter (Signed)
Afrin before flonase x 5 days only augmentin 875 bid x 10 days Prednisone 10 mg take  4 each am x 2 days,   2 each am x 2 days,  1 each am x2days and stop  Ov w/in 2 weeks for recheck Tammy, MR or me, sooner if not improving on above rx

## 2013-03-09 NOTE — Telephone Encounter (Signed)
Spoke with Isaac [name] She states that she had faxed over form for COPD device through [facility] on 02/19/13  She states that this is a device that will ask the pt questions about his COPD daily  She states that the pt agreed to this already, but when I called and talked to him he stated he knew nothing about the device MR, do you have the form, and do you want him to have the device? Please advise, thanks!

## 2013-03-09 NOTE — Telephone Encounter (Signed)
LMTCB for [employer]

## 2013-03-12 NOTE — Telephone Encounter (Signed)
Jenn and/or MR, have you seen this form? Neither is in the office today Left detailed message on Ranessa's voice mail informing her of the above as asked for another form to be faxed to the triage fax machine "just in case"

## 2013-03-16 ENCOUNTER — Telehealth: Payer: Self-pay | Admitting: Internal Medicine

## 2013-03-16 MED ORDER — LEVOFLOXACIN 500 MG PO TABS: 500.0000 mg | ORAL_TABLET | Freq: Every day | ORAL | Status: DC

## 2013-03-16 NOTE — Telephone Encounter (Signed)
Spoke with pt He had called on 03/09/13 with co's sinus congestion and MW called in augmentin and pred taper He states that his symptoms improved some, but still bother him early am and and night He is having sinus pressure-esp on the left side, and yellow nasal d/c He denies having any cough, wheeze, dyspnea, or fever Taking flonase, netti pot rinses for him sinuses, but feels may need additional abx He kept mentioning ampicillin No appts available to offer Please advise thanks! Allergies  Allergen Reactions  . Codeine Itching    "jittery"  . Codeine Sulfate   . Prozac (Fluoxetine Hcl)     confusion

## 2013-03-16 NOTE — Telephone Encounter (Signed)
Pt advised and levaquin rx sent. [name], CMA

## 2013-03-16 NOTE — Telephone Encounter (Signed)
The only thing that I saw affect. All the papers as of 03/15/2013 was c from [place] [facility] sign by the same person [name] saying that they would enroll the patient in a COPD program run by [employer]. I have not seen anything else    Dr. Kalman Shan, M.D., [facility].C.P Pulmonary and Critical Care Medicine Staff Physician [facility] Pager: [phone], If no answer or between  15:00h - 7:00h: call [phone]  03/16/2013 9:25 AM

## 2013-03-16 NOTE — Telephone Encounter (Signed)
If Augmentin is not helping he can stop this and try Levaquin 500 milligrams once daily for 5 days. If he continues to worsen he needs to come in to see myself or Rikki Spearing or we will have to refer him to ENT physician

## 2013-03-16 NOTE — Telephone Encounter (Signed)
ATC Ranessa to inform of this and to see about re-faxing form. Left msg for Ranessa to return call to our office.

## 2013-03-17 NOTE — Telephone Encounter (Signed)
ATC Ranessa, no answer. Left detailed msg on VM to return call.

## 2013-03-17 NOTE — Telephone Encounter (Signed)
Noted and will forward to [facility] to look out for these forms

## 2013-03-17 NOTE — Telephone Encounter (Signed)
Isaac [name] returned call. She says she put in an order for the form to be faxed to our office. Should come anytime. Please look for it. [name]

## 2013-03-18 NOTE — Telephone Encounter (Signed)
Hey Hen, have you seen the form yet? Please advise thanks!

## 2013-03-20 NOTE — Telephone Encounter (Signed)
Isaac [name] returned call. [phone]. Isaac [name]

## 2013-03-20 NOTE — Telephone Encounter (Signed)
LMTCB because I have not received any forms on this patient. [name], CMA

## 2013-03-20 NOTE — Telephone Encounter (Signed)
I spoke with ranessa and she states they are in the process of changing the who send the forms so she states I should receive it in a week. She states she will follow-up on this.I advised I will keep a look-out for from. I will sign off on note. [name], CMA

## 2013-03-26 ENCOUNTER — Ambulatory Visit (INDEPENDENT_AMBULATORY_CARE_PROVIDER_SITE_OTHER): Payer: 59 | Admitting: Internal Medicine

## 2013-03-26 ENCOUNTER — Encounter: Payer: Self-pay | Admitting: Internal Medicine

## 2013-03-26 VITALS — BP 130/94 | HR 112 | Temp 98.8°F | Ht 71.0 in | Wt 286.0 lb

## 2013-03-26 DIAGNOSIS — J449 Chronic obstructive pulmonary disease, unspecified: Secondary | ICD-10-CM

## 2013-03-26 NOTE — Progress Notes (Signed)
Subjective:    Patient ID: Isaac [name], male    DOB: 20-May-1956, 57 y.o.   MRN: [id number] Primary care physician is Dr. [name] Cardiologist is Dr. Chanda Busing.  HPI #Obesity Body mass index is 39.91 kg/(m^2). on 03/26/2013  #Chronic sinusitis T sinus 2006   - IMPRESSION:  1. Ethmoid and frontal sinus dise Case. Sphenoid and maxillary sinuses are clear.  2. Patent ostiomeatal complexes.  #Smoking history  reports that he quit smoking about 2 years ago. His smoking use included Cigarettes. He has a 35 pack-year smoking history. He does not have any smokeless tobacco history on file.  #Multifactorial dyspnea -  Nuclear med stress test  2013 -  - negative per hx. Done by Dr Jacinto Halim per hx  #COPD - Severe/very severe COPD Spirometry 12/31/11 done at [facility]- fev1 0.8L/21%, Rati 39, 23% BD response on fev1 May 2013: Walk test in office : Pulse 88% at rest -> 1 lap x 185 feet - pulse ox 88% and HR 110 and very dyspneic, so stopped - Status post pulmonary rehabilitation fall 2013   #Evaluation for lung cancer : CXR 12/24/11   Clear lung fields - personally revieweed  #Recurrent COPD - May 2013: Outpatient treatment with doxycycline and prednisone - August 2013: Hospitalization for COPD exacerbation - January 2014: Outpatient treatment with antibiotics and prednisone         OV 03/26/2013 Followup for COPD and obesity. Presents again with his wife  Isaac [name]. Last visit was January 2014  - In terms of obesity has not lost any weight. In fact gained weight  - In terms of COPD: Couple weeks ago he called in for sinus symptoms and he was given antibiotics and prednisone. We had her do a second round of antibiotics as well. After that sinus infection is resolved. His COPD stable but he has good days and bad days. Today is a bad day. COPD cat score as listed below and is 32 reflecting high symptom burden. His wife thinks that he needs to attend pulmonary rehabilitation again. He  has a lot of excuses from the fact that they have one car to be guilty about burdening his wife.. Wife says he is not motivated. They looked at fitness center at [employer] on [address] and as a trainer willing to work with them but patient feels shy showing up there with his oxygen tank     CAT COPD Symptom & Quality of Life Score (GSK trademark) 0 is no burden. 5 is highest burden 08/20/2012  12/08/2012  03/26/2013   Never Cough -> Cough all the time 2 4 3   No phlegm in chest -> Chest is full of phlegm 2 4 3   No chest tightness -> Chest feels very tight 3 4 4   No dyspnea for 1 flight stairs/hill -> Very dyspneic for 1 flight of stairs 4 4 5   No limitations for ADL at home -> Very limited with ADL at home 3 4 4   Confident leaving home -> Not at all confident leaving home 3 3 4   Sleep soundly -> Do not sleep soundly because of lung condition 3 4 4   Lots of Energy -> No energy at all 4 3 5   TOTAL Score (max 40)  24 30 32       Review of Systems  Constitutional: Negative for fever and unexpected weight change.  HENT: Negative for ear pain, nosebleeds, congestion, sore throat, rhinorrhea, sneezing, trouble swallowing, dental problem, postnasal drip  and sinus pressure.   Eyes: Negative for redness and itching.  Respiratory: Negative for cough, chest tightness, shortness of breath and wheezing.   Cardiovascular: Negative for palpitations and leg swelling.  Gastrointestinal: Negative for nausea and vomiting.  Genitourinary: Negative for dysuria.  Musculoskeletal: Negative for joint swelling.  Skin: Negative for rash.  Neurological: Negative for headaches.  Hematological: Does not bruise/bleed easily.  Psychiatric/Behavioral: Negative for dysphoric mood. The patient is not nervous/anxious.    Current outpatient prescriptions:albuterol (PROVENTIL HFA;VENTOLIN HFA) 108 (90 BASE) MCG/ACT inhaler, Inhale 2 puffs into the lungs every 6 (six) hours as needed., Disp: 3 Inhaler, Rfl: 3;   albuterol (PROVENTIL) (2.5 MG/3ML) 0.083% nebulizer solution, Take 3 mLs (2.5 mg total) by nebulization 4 (four) times daily., Disp: 1080 mL, Rfl: 3;  benzonatate (TESSALON) 200 MG capsule, Take 1 capsule by mouth Three times daily as needed., Disp: , Rfl:  budesonide (PULMICORT) 0.5 MG/2ML nebulizer solution, Take 2 mLs (0.5 mg total) by nebulization 2 (two) times daily., Disp: 360 mL, Rfl: 3;  cloNIDine (CATAPRES) 0.2 MG tablet, Take 1 tablet (0.2 mg total) by mouth 3 (three) times daily., Disp: 90 tablet, Rfl: 6;  fluticasone (FLONASE) 50 MCG/ACT nasal spray, Place 2 sprays into the nose daily., Disp: 16 g, Rfl: 6 hydrochlorothiazide (MICROZIDE) 12.5 MG capsule, Take 1 tablet by mouth Daily., Disp: , Rfl: ;  ipratropium (ATROVENT) 0.02 % nebulizer solution, Take 2.5 mLs (0.5 mg total) by nebulization 4 (four) times daily., Disp: 900 mL, Rfl: 3;  montelukast (SINGULAIR) 10 MG tablet, Take 10 mg by mouth at bedtime., Disp: , Rfl: ;  omeprazole (PRILOSEC) 20 MG capsule, Take 2 capsules (40 mg total) by mouth daily. For GERD., Disp: 30 capsule, Rfl: 6 sertraline (ZOLOFT) 25 MG tablet, Take 1 tablet by mouth daily., Disp: , Rfl:      Objective:   Physical Exam Nursing note and vitals reviewed. Constitutional: He is oriented to person, place, and time. He appears well-developed and well-nourished. No distress.       Body mass index is 39.78 kg/(m^2). Body mass index is 39.91 kg/(m^2). 03/26/2013     HENT:  Head: Normocephalic and atraumatic.  Right Ear: External ear normal.  Left Ear: External ear normal.  Mouth/Throat: Oropharynx is clear and moist. No oropharyngeal exudate.      Baseline xanthelsma +  Eyes: Conjunctivae normal and EOM are normal. Pupils are equal, round, and reactive to light. Right eye exhibits no discharge. Left eye exhibits no discharge. No scleral icterus.  Neck: Normal range of motion. Neck supple. No JVD present. No tracheal deviation present. No thyromegaly present.   Cardiovascular: Normal rate, regular rhythm and intact distal pulses.  Exam reveals no gallop and no friction rub.   No murmur heard. Pulmonary/Chest: Effort normal. No respiratory distress. He has no wheezes today. He has no rales. He exhibits no tenderness.       Abdominal: Soft. Bowel sounds are normal. He exhibits no distension and no mass. There is no tenderness. There is no rebound and no guarding.  Musculoskeletal: Normal range of motion. He exhibits no edema and no tenderness.  Lymphadenopathy:    He has no cervical adenopathy.  Neurological: He is alert and oriented to person, place, and time. He has normal reflexes. No cranial nerve deficit. Coordination normal.  Skin: Skin is warm and dry. No rash noted. He is not diaphoretic. No erythema. No pallor.  Psychiatric: He has a normal mood and affect. His behavior is normal. Judgment and  thought content normal.           Assessment & Plan:

## 2013-03-26 NOTE — Assessment & Plan Note (Signed)
COPD appears stable Continue nebulizers and oxygen If sinus problems happen again call us;  - for maintenance,  use 3% hypertonic saline spray at night and Flonase daily at day. Use netti pot every few days Please join the gym and start working out Please focus on nutrition and weight loss Return to see me in 4 months or sooner if needed  - At followup will discuss lung cancer screening and medications to prevent COPD exacerbations 

## 2013-03-26 NOTE — Patient Instructions (Addendum)
COPD appears stable Continue nebulizers and oxygen If sinus problems happen again call us;  - for maintenance,  use 3% hypertonic saline spray at night and Flonase daily at day. Use netti pot every few days Please join the gym and start working out Please focus on nutrition and weight loss Return to see me in 4 months or sooner if needed  - At followup will discuss lung cancer screening and medications to prevent COPD exacerbations

## 2013-04-07 ENCOUNTER — Telehealth: Payer: Self-pay | Admitting: Internal Medicine

## 2013-04-07 NOTE — Telephone Encounter (Signed)
Spoke with Caren Macadam at [facility]-- She states patient has requested to be apart of a heart failure program in which a home monitoring device is needed Per Marika a device rx request was faxed 03/30/13 to our office for MR signature Victorino Dike have you seen this form? I have requested they refax form now just in case

## 2013-04-08 NOTE — Telephone Encounter (Signed)
Please see my message below.

## 2013-04-08 NOTE — Telephone Encounter (Signed)
As of 04/08/2013 6:49 PM I cleard up my entire look at on my desk and there was nothing. I am not sure why a chf related stuff should be coming to me   Dr. Kalman Shan, M.D., [facility].C.P Pulmonary and Critical Care Medicine Staff Physician [facility] [place] [facility] Critical [facility] Pager: [phone], If no answer or between  15:00h - 7:00h: call [phone]  04/08/2013 6:49 PM

## 2013-04-09 NOTE — Telephone Encounter (Signed)
The forms were in my look-at when I returned. It is not for CHF it is for a COPD daily monitoring program through the pt insurance. I have placed the form in your look-at to review on your return. Please advise. [name], CMA

## 2013-04-10 NOTE — Telephone Encounter (Signed)
Isaac [name] from [place] called re: status of request for in-home  monitoring device. [phone]. Isaac [name]

## 2013-04-15 NOTE — Telephone Encounter (Signed)
Jenn and MR, pls advise if this has been addressed yet.  Thank you.

## 2013-04-16 NOTE — Telephone Encounter (Signed)
MR these are in your look-at stack that I placed in your office, do you have this form? Isaac [name], CMA

## 2013-04-19 NOTE — Telephone Encounter (Signed)
wil lgive it to you on Tuesday - the day after memorial day   Dr. Kalman Shan, M.D., [facility].C.P Pulmonary and Critical Care Medicine Staff Physician [facility] [place] [facility]monary and Critical Care Pager: [phone], If no answer or between  15:00h - 7:00h: call [phone]  04/19/2013 1:42 AM

## 2013-04-22 NOTE — Telephone Encounter (Signed)
Forms faxed and placed in scan folder. [name], CMA

## 2013-05-07 ENCOUNTER — Telehealth: Payer: Self-pay | Admitting: Internal Medicine

## 2013-05-07 DIAGNOSIS — J449 Chronic obstructive pulmonary disease, unspecified: Secondary | ICD-10-CM

## 2013-05-07 NOTE — Telephone Encounter (Signed)
Called first # provided - line rang multiple times with NA and no option to leave msg. Called the other # provided - [phone] - [email or web address]

## 2013-05-08 NOTE — Telephone Encounter (Signed)
Spoke with patient, Patient states nebulizer machine has been making loud noise x1week States he took machine to [facility] and they tried to fix it (change filters) but this did not work and machine continues to make loud noises Order has been placed to [facility] for patient to receive new neb Patient informed that if he does not hear from [facility] or has any more problems please give our office a call

## 2013-05-13 ENCOUNTER — Other Ambulatory Visit: Payer: Self-pay | Admitting: Internal Medicine

## 2013-05-13 DIAGNOSIS — R7401 Elevation of levels of liver transaminase levels: Secondary | ICD-10-CM

## 2013-05-21 ENCOUNTER — Ambulatory Visit
Admission: RE | Admit: 2013-05-21 | Discharge: 2013-05-21 | Disposition: A | Discharge status: Home / Self Care | Payer: 59 | Source: Ambulatory Visit | Attending: Internal Medicine | Admitting: Internal Medicine

## 2013-05-21 DIAGNOSIS — R7401 Elevation of levels of liver transaminase levels: Secondary | ICD-10-CM

## 2013-07-29 ENCOUNTER — Telehealth: Payer: Self-pay | Admitting: Internal Medicine

## 2013-07-29 NOTE — Telephone Encounter (Signed)
left messages for pt to call back to schedule follow up apt. No return calls back. Sent letter 07/29/13 ° °

## 2013-09-10 ENCOUNTER — Telehealth: Payer: Self-pay | Admitting: Internal Medicine

## 2013-09-10 MED ORDER — PREDNISONE 10 MG PO TABS: ORAL_TABLET | ORAL | Status: DC

## 2013-09-10 NOTE — Telephone Encounter (Signed)
Pt aware of recs. rx sent 

## 2013-09-10 NOTE — Telephone Encounter (Signed)
Last visit 03-26-13. MR COPD pt. I spoke with the pt and he is c/o a sore throat, sinus drainage, PND, productive cough with white phlegm, increased SOB on 3 liters oxygen. Pt states he has a pulse ox and his sats stay in the high 90's while on O2. He states all symptoms have been going on x 3 days, but are much worse today. He denies any fever, wheezing, chest tightness at this time. Pt states transportation issues when refusing an appt and requests rx be called in. Please advise. [name], CMA Allergies  Allergen Reactions  . Codeine Itching    "jittery"  . Codeine Sulfate   . Prozac [Fluoxetine Hcl]     confusion

## 2013-09-10 NOTE — Telephone Encounter (Signed)
I spoke with the pt and he states he is having issues with his heart racing in the mornings and feels like his BP is elevated at times. I advised the pt that he needs to contact his PCP about this today.[name], CMA

## 2013-09-10 NOTE — Telephone Encounter (Signed)
Per CY-give Prednisone 10mg  #20 take 4 x  2 days, 3 x 2 days, 2 x 2 days, 1 x 2 days, then stop no refills.

## 2013-09-22 ENCOUNTER — Other Ambulatory Visit: Payer: Self-pay | Admitting: Adult Health

## 2013-09-23 NOTE — Telephone Encounter (Signed)
Last ov 5.1.14 w/ MR, follow up in 4 months No pending appts Will approve Ventolin HFA x1 Needs ov for future refills

## 2013-10-15 ENCOUNTER — Other Ambulatory Visit: Payer: Self-pay | Admitting: Internal Medicine

## 2014-02-09 ENCOUNTER — Telehealth: Payer: Self-pay | Admitting: Internal Medicine

## 2014-02-09 MED ORDER — PREDNISONE 10 MG PO TABS: ORAL_TABLET | ORAL | Status: DC

## 2014-02-09 MED ORDER — DOXYCYCLINE HYCLATE 100 MG PO TABS: 100.0000 mg | ORAL_TABLET | Freq: Two times a day (BID) | ORAL | Status: DC

## 2014-02-09 NOTE — Telephone Encounter (Signed)
Called and spoke with pt. PCP giving ABX amoxicillin-not helping. C/o nasal congestion, nausea, PND, slight cough, sinus pressure. He is requesting to have something called in. Offered appt but refused. He reports R always calls him something in w/o being seen. Please advise MR thanks  Allergies  Allergen Reactions  . Codeine Itching    "jittery"  . Codeine Sulfate   . Prozac [Fluoxetine Hcl]     confusion

## 2014-02-09 NOTE — Telephone Encounter (Signed)
Pt aware of recs. rx sent. Nothing further needed

## 2014-02-09 NOTE — Telephone Encounter (Signed)
Might be in AECOPD or Acute sinusitis.   Switch to   Take doxycycline 100mg  po twice daily x 7 days; take after meals and avoid sunlight  Take prednisone 40 mg daily x 2 days, then 20mg  daily x 2 days, then 10mg  daily x 2 days, then 5mg  daily x 2 days and stop  If not better, needs to come in or go to ER/Urgen tcare  Ensure routine copd fu  Dr. Brand Males, M.D., [facility].C.P Pulmonary and Critical Care Medicine Staff Physician [place] Pulmonary and Critical Care Pager: [phone], If no answer or between  15:00h - 7:00h: call [phone]  02/09/2014 12:16 PM

## 2014-03-19 ENCOUNTER — Telehealth: Payer: Self-pay | Admitting: Internal Medicine

## 2014-03-19 MED ORDER — LEVOFLOXACIN 500 MG PO TABS: 500.0000 mg | ORAL_TABLET | Freq: Every day | ORAL | Status: DC

## 2014-03-19 MED ORDER — PREDNISONE 10 MG PO TABS: ORAL_TABLET | ORAL | Status: DC

## 2014-03-19 NOTE — Telephone Encounter (Signed)
Spoke with spouse and aware of recs. I have sent these to the pharm. Appt has been scheduled for 04/12/14. She was hesitant when asked if pt is taking his inhalers correctly. BUt she stated he was and reported to leave it at that. Nothing further needed

## 2014-03-19 NOTE — Telephone Encounter (Signed)
Send aecopd ins 2 months Is he taking maintenace inhalers correctly?   take levaquin 500mg  once daily  X 5 days  Take prednisone 40 mg daily x 2 days, then 20mg  daily x 2 days, then 10mg  daily x 2 days, then 5mg  daily x 2 days and stop  If worse go to ER  Give him first available with me     Dr. Brand Males, M.D., [facility].C.P Pulmonary and Critical Care Medicine Staff Physician [place] [facility]monary and Critical [facility] Pager: [phone], If no answer or between  15:00h - 7:00h: call [phone]  03/19/2014 10:31 AM

## 2014-03-19 NOTE — Telephone Encounter (Signed)
Called and spoke with pt and he stated that for the last couple of days he has been having chest and head congestion, having a hard time breathing and not been able to get anything up out of his chest.  He has been using the nebulizer and the mucinex and nasal saline spray.  Pt stated that he started to get worse last night and today.  Pt is requesting something to be called into his pharmacy.  MR please advise. Thanks  Allergies  Allergen Reactions  . Codeine Itching    "jittery"  . Codeine Sulfate   . Prozac [Fluoxetine Hcl]     confusion     Current Outpatient Prescriptions on File Prior to Visit  Medication Sig Dispense Refill  . albuterol (PROVENTIL) (2.5 MG/3ML) 0.083% nebulizer solution Take 3 mLs (2.5 mg total) by nebulization 4 (four) times daily.  1080 mL  3  . benzonatate (TESSALON) 200 MG capsule Take 1 capsule by mouth Three times daily as needed.      . budesonide (PULMICORT) 0.5 MG/2ML nebulizer solution Take 2 mLs (0.5 mg total) by nebulization 2 (two) times daily.  360 mL  3  . cloNIDine (CATAPRES) 0.2 MG tablet Take 1 tablet (0.2 mg total) by mouth 3 (three) times daily.  90 tablet  6  . doxycycline (VIBRA-TABS) 100 MG tablet Take 1 tablet (100 mg total) by mouth 2 (two) times daily.  14 tablet  0  . fluticasone (FLONASE) 50 MCG/ACT nasal spray Place 2 sprays into the nose daily.  16 g  6  . hydrochlorothiazide (MICROZIDE) 12.5 MG capsule Take 1 tablet by mouth Daily.      Marland Kitchen ipratropium (ATROVENT) 0.02 % nebulizer solution Take 2.5 mLs (0.5 mg total) by nebulization 4 (four) times daily.  900 mL  3  . montelukast (SINGULAIR) 10 MG tablet Take 10 mg by mouth at bedtime.      Marland Kitchen omeprazole (PRILOSEC) 20 MG capsule Take 2 capsules (40 mg total) by mouth daily. For GERD.  30 capsule  6  . predniSONE (DELTASONE) 10 MG tablet Take 4 tabs daily x 2 days,  2 tabs daily x 2 days, 1 tab daily x 2 days then 1/2 tab daily x 2 days  15 tablet  0  . sertraline (ZOLOFT) 25 MG tablet Take 1  tablet by mouth daily.      . VENTOLIN HFA 108 (90 BASE) MCG/ACT inhaler INHALE 2 PUFFS EVERY 6 HOURS AS NEEDED  18 g  0   No current facility-administered medications on file prior to visit.

## 2014-04-12 ENCOUNTER — Ambulatory Visit (INDEPENDENT_AMBULATORY_CARE_PROVIDER_SITE_OTHER): Payer: 59 | Admitting: Internal Medicine

## 2014-04-12 ENCOUNTER — Other Ambulatory Visit (INDEPENDENT_AMBULATORY_CARE_PROVIDER_SITE_OTHER): Payer: 59

## 2014-04-12 ENCOUNTER — Encounter: Payer: Self-pay | Admitting: Internal Medicine

## 2014-04-12 VITALS — BP 98/60 | HR 80 | Ht 71.0 in | Wt 298.0 lb

## 2014-04-12 DIAGNOSIS — J329 Chronic sinusitis, unspecified: Secondary | ICD-10-CM

## 2014-04-12 DIAGNOSIS — J441 Chronic obstructive pulmonary disease with (acute) exacerbation: Secondary | ICD-10-CM

## 2014-04-12 DIAGNOSIS — J449 Chronic obstructive pulmonary disease, unspecified: Secondary | ICD-10-CM

## 2014-04-12 LAB — CBC WITH DIFFERENTIAL/PLATELET
Basophils Absolute: 0 10*3/uL (ref 0.0–0.1)
Basophils Relative: 0.3 % (ref 0.0–3.0)
Eosinophils Absolute: 0.4 10*3/uL (ref 0.0–0.7)
Eosinophils Relative: 3.3 % (ref 0.0–5.0)
HCT: 37.5 % — ABNORMAL LOW (ref 39.0–52.0)
Hemoglobin: 12.1 g/dL — ABNORMAL LOW (ref 13.0–17.0)
Lymphocytes Relative: 27.1 % (ref 12.0–46.0)
Lymphs Abs: 3.5 10*3/uL (ref 0.7–4.0)
MCHC: 32.2 g/dL (ref 30.0–36.0)
MCV: 83.1 fl (ref 78.0–100.0)
Monocytes Absolute: 1.4 10*3/uL — ABNORMAL HIGH (ref 0.1–1.0)
Monocytes Relative: 11.1 % (ref 3.0–12.0)
Neutro Abs: 7.5 10*3/uL (ref 1.4–7.7)
Neutrophils Relative %: 58.2 % (ref 43.0–77.0)
Platelets: 353 10*3/uL (ref 150.0–400.0)
RBC: 4.52 Mil/uL (ref 4.22–5.81)
RDW: 14 % (ref 11.5–15.5)
WBC: 12.9 10*3/uL — ABNORMAL HIGH (ref 4.0–10.5)

## 2014-04-12 NOTE — Patient Instructions (Signed)
Unclear cause for recurrent copd and sinus flare up Do CBC with differential blood test Refer to Dr Benjamine Mola of ENT regarding persistent sinus issue Continue copd nebs/inhalers and O2  as before Please talk to my [facility] regarding innogen o2 system; I personally support it  Followup Do full PFT in 2 months Office visit in 2 months after PFT

## 2014-04-12 NOTE — Progress Notes (Addendum)
Subjective:    Patient ID: Isaac [name], male    DOB: 1956/10/27, 58 y.o.   MRN: [id number]  HPI Primary care physician is Dr. Jani Gravel Cardiologist is Dr. Donalee Citrin.  HPI #Obesity Body mass index is 39.91 kg/(m^2). on 03/26/2013  #Chronic sinusitis T sinus 2006   - IMPRESSION:  1. Ethmoid and frontal sinus dise Case. Sphenoid and maxillary sinuses are clear.  2. Patent ostiomeatal complexes.  #Smoking history  reports that he quit smoking about 2 years ago. His smoking use included Cigarettes. He has a 35 pack-year smoking history. He does not have any smokeless tobacco history on file.  #Multifactorial dyspnea -  Nuclear med stress test  2013 -  - negative per hx. Done by Dr Einar Gip per hx  #COPD - Severe/very severe COPD Spirometry 12/31/11 done at [facility]- fev1 0.8L/21%, Rati 39, 23% BD response on fev1 May 2013: Walk test in office : Pulse 88% at rest -> 1 lap x 185 feet - pulse ox 88% and HR 110 and very dyspneic, so stopped - Status post pulmonary rehabilitation fall 2013   #Evaluation for lung cancer : CXR 12/24/11   Clear lung fields - personally revieweed  #Recurrent COPD - May 2013: Outpatient treatment with doxycycline and prednisone - August 2013: Hospitalization for COPD exacerbation - January 2014: Outpatient treatment with antibiotics and prednisone - March 2015 : telephoine Rx with sinusitis, abx and prednisone - April 2015: telephoine Rx with abx and prednisone   OV 03/26/2013 Followup for COPD and obesity. Presents again with his wife  Berman Grainger. Last visit was January 2014  - In terms of obesity has not lost any weight. In fact gained weight  - In terms of COPD: Couple weeks ago he called in for sinus symptoms and he was given antibiotics and prednisone. We had her do a second round of antibiotics as well. After that sinus infection is resolved. His COPD stable but he has good days and bad days. Today is a bad day. COPD cat score as listed below  and is 32 reflecting high symptom burden. His wife thinks that he needs to attend pulmonary rehabilitation again. He has a lot of excuses from the fact that they have one car to be guilty about burdening his wife.. Wife says he is not motivated. They looked at fitness center at [employer] on [address] and as a trainer willing to work with them but patient feels shy showing up there with his oxygen tank      OV 04/12/2014  Chief Complaint  Patient presents with  . Follow-up    Pt states breathing hasn't been doing well. Pt c/o increase in SOB and work of breathing and c/o chest tightness with exertion and in the mornings. Denies cough.    Follow up severe COPD with bronchodilator response, recc AECOPD, chronic sinusitis. . Ass.obesity +  - I have not seen him since May 2014. Since then overall he has been stable but then in mid March 2015 he called in with worsening left-sided acute sinusitis but was then resulting in worsening COPD and associated with a COPD exacerbation. Over the telephone we treated him with antibiotics and prednisone. But then he had a recurrence of the same thing on 03/19/2014 and again treated with another set of antibiotics and prednisone. Both of these resulted in some improvement of the flareups but he is persistent chronic sinus symptoms especially on the left nostril. He recollects being seen by ENT  Dr Erik Obey for the same and apparently surgery or MRI was recommended but he did not follow through. He wants to see another ENT specialist now.  He is on maximal nasal therapy and he feels his sinuses result in AECPD. In terms of his COPD it this is currently stable . Wife says fall and spring are worst times for him.   - Lab review: noticed periphreal eosinophilia in 2013.    _ I discussed potential participation in [employer] research study due to rrecurrent AECOPD and associated peripheral eosinophilia. He and his wife are interested. I have advised them we  can consider this but will see what his ENT workup results in. They are agreeable  CAT COPD Symptom & Quality of Life Score ([place] trademark) 0 is no burden. 5 is highest burden 08/20/2012  12/08/2012  03/26/2013   Never Cough -> Cough all the time 2 4 3   No phlegm in chest -> Chest is full of phlegm 2 4 3   No chest tightness -> Chest feels very tight 3 4 4   No dyspnea for 1 flight stairs/hill -> Very dyspneic for 1 flight of stairs 4 4 5   No limitations for ADL at home -> Very limited with ADL at home 3 4 4   Confident leaving home -> Not at all confident leaving home 3 3 4   Sleep soundly -> Do not sleep soundly because of lung condition 3 4 4   Lots of Energy -> No energy at all 4 3 5   TOTAL Score (max 40)  24 30 32     Review of Systems  Constitutional: Negative for fever and unexpected weight change.  HENT: Positive for congestion and sinus pressure. Negative for dental problem, ear pain, nosebleeds, postnasal drip, rhinorrhea, sneezing, sore throat and trouble swallowing.   Eyes: Negative for redness and itching.  Respiratory: Positive for cough, chest tightness and shortness of breath. Negative for wheezing.   Cardiovascular: Negative for palpitations and leg swelling.  Gastrointestinal: Negative for nausea and vomiting.  Genitourinary: Negative for dysuria.  Musculoskeletal: Negative for joint swelling.  Skin: Negative for rash.  Neurological: Negative for headaches.  Hematological: Does not bruise/bleed easily.  Psychiatric/Behavioral: Negative for dysphoric mood. The patient is not nervous/anxious.    .Current outpatient prescriptions:azelastine (ASTELIN) 0.1 % nasal spray, Place into both nostrils as needed., Disp: , Rfl: ;  losartan-hydrochlorothiazide (HYZAAR) 50-12.5 MG per tablet, Take 1 tablet by mouth daily., Disp: , Rfl: ;  montelukast (SINGULAIR) 10 MG tablet, Take 10 mg by mouth at bedtime., Disp: , Rfl: ;  omeprazole (PRILOSEC) 20 MG capsule, Take 2 capsules (40 mg total)  by mouth daily. For GERD., Disp: 30 capsule, Rfl: 6 sertraline (ZOLOFT) 25 MG tablet, Take 1 tablet by mouth daily., Disp: , Rfl: ;  tamsulosin (FLOMAX) 0.4 MG CAPS capsule, Take 1 capsule by mouth daily., Disp: , Rfl: ;  VENTOLIN HFA 108 (90 BASE) MCG/ACT inhaler, INHALE 2 PUFFS EVERY 6 HOURS AS NEEDED, Disp: 18 g, Rfl: 0;  albuterol (PROVENTIL) (2.5 MG/3ML) 0.083% nebulizer solution, Take 3 mLs (2.5 mg total) by nebulization 4 (four) times daily., Disp: 1080 mL, Rfl: 3 benzonatate (TESSALON) 200 MG capsule, Take 1 capsule by mouth Three times daily as needed., Disp: , Rfl: ;  budesonide (PULMICORT) 0.5 MG/2ML nebulizer solution, Take 2 mLs (0.5 mg total) by nebulization 2 (two) times daily., Disp: 360 mL, Rfl: 3;  cloNIDine (CATAPRES) 0.2 MG tablet, Take 1 tablet (0.2 mg total) by mouth 3 (three) times daily.,  Disp: 90 tablet, Rfl: 6 fluticasone (FLONASE) 50 MCG/ACT nasal spray, Place 2 sprays into the nose daily., Disp: 16 g, Rfl: 6;  ipratropium (ATROVENT) 0.02 % nebulizer solution, Take 2.5 mLs (0.5 mg total) by nebulization 4 (four) times daily., Disp: 900 mL, Rfl: 3      Objective:   Physical Exam  Nursing note and vitals reviewed. Constitutional: He is oriented to person, place, and time. He appears well-developed and well-nourished. No distress.  Body mass index is 41.58 kg/(m^2).   HENT:  Head: Normocephalic and atraumatic.  Right Ear: External ear normal.  Left Ear: External ear normal.  Mouth/Throat: Oropharynx is clear and moist. No oropharyngeal exudate.  Long beard and hair Mild post nasal drip +  Eyes: Conjunctivae and EOM are normal. Pupils are equal, round, and reactive to light. Right eye exhibits no discharge. Left eye exhibits no discharge. No scleral icterus.  Neck: Normal range of motion. Neck supple. No JVD present. No tracheal deviation present. No thyromegaly present.  Cardiovascular: Normal rate, regular rhythm and intact distal pulses.  Exam reveals no gallop and no  friction rub.   No murmur heard. Pulmonary/Chest: Effort normal and breath sounds normal. No respiratory distress. He has no wheezes. He has no rales. He exhibits no tenderness.  Abdominal: Soft. Bowel sounds are normal. He exhibits no distension and no mass. There is no tenderness. There is no rebound and no guarding.  Musculoskeletal: Normal range of motion. He exhibits no edema and no tenderness.  Lymphadenopathy:    He has no cervical adenopathy.  Neurological: He is alert and oriented to person, place, and time. He has normal reflexes. No cranial nerve deficit. Coordination normal.  Skin: Skin is warm and dry. No rash noted. He is not diaphoretic. No erythema. No pallor.  Psychiatric:  Flat affect +          Assessment & Plan:

## 2014-04-13 DIAGNOSIS — J329 Chronic sinusitis, unspecified: Secondary | ICD-10-CM | POA: Insufficient documentation

## 2014-04-13 NOTE — Assessment & Plan Note (Addendum)
Currently copd stable Unclear cause for recurrent copd and sinus flare up Do CBC with differential blood test Refer to Dr Benjamine Mola of ENT regarding persistent sinus issue Continue copd nebs/inhalers and O2  as before Please talk to my [facility] regarding innogen o2 system; I personally support it  Followup Do full PFT in 2 months Office visit in 2 months after PFT  > 50% of this > 25 min visit spent in face to face counseling (15 min visit converted to 25 min)

## 2014-04-13 NOTE — Assessment & Plan Note (Signed)
Refer ENT. Wants to see someone other than [facility]. So will refer to dr Elgie Congo

## 2014-04-14 ENCOUNTER — Telehealth: Payer: Self-pay | Admitting: Internal Medicine

## 2014-04-14 NOTE — Telephone Encounter (Signed)
  Recent Labs Lab 04/12/14 1616  HGB 12.1*  HCT 37.5*  WBC 12.9*  PLT 353.0    1. Anemic - but staable since Aug 2013. REcommened to talk Jani Gravel, MD. I will inform Dr Maudie Mercury  2. peripheral eosinophils  -> high,currently 400. Marking him for recurrent sinus and copd exacerbations. Rec: definitely see ENT. Discussed [employer] [place] study.He is interested.  Dr. Brand Males, M.D., [facility].C.P Pulmonary and Critical Care Medicine Staff Physician [facility] Pager: [phone], If no answer or between  15:00h - 7:00h: call [phone]  04/14/2014 7:30 PM

## 2014-04-22 ENCOUNTER — Telehealth: Payer: Self-pay | Admitting: Internal Medicine

## 2014-04-22 MED ORDER — PREDNISONE 10 MG PO TABS: ORAL_TABLET | ORAL | Status: DC

## 2014-04-22 MED ORDER — DOXYCYCLINE HYCLATE 100 MG PO TABS: ORAL_TABLET | ORAL | Status: DC

## 2014-04-22 NOTE — Telephone Encounter (Signed)
Spoke with pt.  C/o prod cough (green) and sinus drainage (green), sob, low grade temp. Pt last seen on 04/12/14.  Offered ov with BQ today bit states that MR normally gives him an abx and prednisone.  Please advise.  Allergies  Allergen Reactions  . Codeine Itching    "jittery"  . Codeine Sulfate   . Prozac [Fluoxetine Hcl]     confusion    Current Outpatient Prescriptions on File Prior to Visit  Medication Sig Dispense Refill  . albuterol (PROVENTIL) (2.5 MG/3ML) 0.083% nebulizer solution Take 3 mLs (2.5 mg total) by nebulization 4 (four) times daily.  1080 mL  3  . azelastine (ASTELIN) 0.1 % nasal spray Place into both nostrils as needed.      . benzonatate (TESSALON) 200 MG capsule Take 1 capsule by mouth Three times daily as needed.      . budesonide (PULMICORT) 0.5 MG/2ML nebulizer solution Take 2 mLs (0.5 mg total) by nebulization 2 (two) times daily.  360 mL  3  . cloNIDine (CATAPRES) 0.2 MG tablet Take 1 tablet (0.2 mg total) by mouth 3 (three) times daily.  90 tablet  6  . fluticasone (FLONASE) 50 MCG/ACT nasal spray Place 2 sprays into the nose daily.  16 g  6  . ipratropium (ATROVENT) 0.02 % nebulizer solution Take 2.5 mLs (0.5 mg total) by nebulization 4 (four) times daily.  900 mL  3  . losartan-hydrochlorothiazide (HYZAAR) 50-12.5 MG per tablet Take 1 tablet by mouth daily.      . montelukast (SINGULAIR) 10 MG tablet Take 10 mg by mouth at bedtime.      Marland Kitchen omeprazole (PRILOSEC) 20 MG capsule Take 2 capsules (40 mg total) by mouth daily. For GERD.  30 capsule  6  . sertraline (ZOLOFT) 25 MG tablet Take 1 tablet by mouth daily.      . tamsulosin (FLOMAX) 0.4 MG CAPS capsule Take 1 capsule by mouth daily.      . VENTOLIN HFA 108 (90 BASE) MCG/ACT inhaler INHALE 2 PUFFS EVERY 6 HOURS AS NEEDED  18 g  0   No current facility-administered medications on file prior to visit.

## 2014-04-22 NOTE — Telephone Encounter (Signed)
MR called back and stated that he is not aval to answer sick messages.  Will need to forward to DOD.   Will forward to CY for further recs.   CY please advise. Thanks

## 2014-04-22 NOTE — Telephone Encounter (Signed)
Called pt to inform him of rx. Pt verbalized understanding and denied any further questions or concerns at this time. Rx sent to preferred pharmacy. Nothing further needed.

## 2014-04-23 ENCOUNTER — Other Ambulatory Visit (INDEPENDENT_AMBULATORY_CARE_PROVIDER_SITE_OTHER): Payer: Self-pay | Admitting: Otolaryngology

## 2014-04-23 DIAGNOSIS — I671 Cerebral aneurysm, nonruptured: Secondary | ICD-10-CM

## 2014-04-24 ENCOUNTER — Emergency Department (HOSPITAL_COMMUNITY): Payer: 59

## 2014-04-24 ENCOUNTER — Encounter (HOSPITAL_COMMUNITY): Payer: Self-pay | Admitting: Emergency Medicine

## 2014-04-24 ENCOUNTER — Inpatient Hospital Stay (HOSPITAL_COMMUNITY)
Admission: EM | Admit: 2014-04-24 | Discharge: 2014-05-05 | DRG: 208 | Disposition: A | Discharge status: Home Health | Payer: 59 | Attending: Pulmonary Disease | Admitting: Pulmonary Disease

## 2014-04-24 DIAGNOSIS — Z87442 Personal history of urinary calculi: Secondary | ICD-10-CM

## 2014-04-24 DIAGNOSIS — Z806 Family history of leukemia: Secondary | ICD-10-CM

## 2014-04-24 DIAGNOSIS — J9690 Respiratory failure, unspecified, unspecified whether with hypoxia or hypercapnia: Secondary | ICD-10-CM

## 2014-04-24 DIAGNOSIS — E872 Acidosis, unspecified: Secondary | ICD-10-CM | POA: Diagnosis present

## 2014-04-24 DIAGNOSIS — R7309 Other abnormal glucose: Secondary | ICD-10-CM | POA: Diagnosis present

## 2014-04-24 DIAGNOSIS — N289 Disorder of kidney and ureter, unspecified: Secondary | ICD-10-CM

## 2014-04-24 DIAGNOSIS — G4733 Obstructive sleep apnea (adult) (pediatric): Secondary | ICD-10-CM | POA: Diagnosis present

## 2014-04-24 DIAGNOSIS — E662 Morbid (severe) obesity with alveolar hypoventilation: Secondary | ICD-10-CM | POA: Diagnosis present

## 2014-04-24 DIAGNOSIS — J019 Acute sinusitis, unspecified: Secondary | ICD-10-CM | POA: Diagnosis present

## 2014-04-24 DIAGNOSIS — IMO0002 Reserved for concepts with insufficient information to code with codable children: Secondary | ICD-10-CM

## 2014-04-24 DIAGNOSIS — J449 Chronic obstructive pulmonary disease, unspecified: Secondary | ICD-10-CM

## 2014-04-24 DIAGNOSIS — Z8249 Family history of ischemic heart disease and other diseases of the circulatory system: Secondary | ICD-10-CM

## 2014-04-24 DIAGNOSIS — J9620 Acute and chronic respiratory failure, unspecified whether with hypoxia or hypercapnia: Secondary | ICD-10-CM

## 2014-04-24 DIAGNOSIS — G4730 Sleep apnea, unspecified: Secondary | ICD-10-CM

## 2014-04-24 DIAGNOSIS — J329 Chronic sinusitis, unspecified: Secondary | ICD-10-CM

## 2014-04-24 DIAGNOSIS — I10 Essential (primary) hypertension: Secondary | ICD-10-CM

## 2014-04-24 DIAGNOSIS — G9349 Other encephalopathy: Secondary | ICD-10-CM | POA: Diagnosis present

## 2014-04-24 DIAGNOSIS — E785 Hyperlipidemia, unspecified: Secondary | ICD-10-CM | POA: Diagnosis present

## 2014-04-24 DIAGNOSIS — I519 Heart disease, unspecified: Secondary | ICD-10-CM

## 2014-04-24 DIAGNOSIS — Z823 Family history of stroke: Secondary | ICD-10-CM

## 2014-04-24 DIAGNOSIS — I5032 Chronic diastolic (congestive) heart failure: Secondary | ICD-10-CM | POA: Diagnosis present

## 2014-04-24 DIAGNOSIS — I498 Other specified cardiac arrhythmias: Secondary | ICD-10-CM | POA: Diagnosis not present

## 2014-04-24 DIAGNOSIS — H0520 Unspecified exophthalmos: Secondary | ICD-10-CM | POA: Diagnosis present

## 2014-04-24 DIAGNOSIS — Z79899 Other long term (current) drug therapy: Secondary | ICD-10-CM

## 2014-04-24 DIAGNOSIS — I509 Heart failure, unspecified: Secondary | ICD-10-CM | POA: Diagnosis present

## 2014-04-24 DIAGNOSIS — F411 Generalized anxiety disorder: Secondary | ICD-10-CM | POA: Diagnosis not present

## 2014-04-24 DIAGNOSIS — T380X5A Adverse effect of glucocorticoids and synthetic analogues, initial encounter: Secondary | ICD-10-CM | POA: Diagnosis present

## 2014-04-24 DIAGNOSIS — Z888 Allergy status to other drugs, medicaments and biological substances status: Secondary | ICD-10-CM

## 2014-04-24 DIAGNOSIS — J441 Chronic obstructive pulmonary disease with (acute) exacerbation: Secondary | ICD-10-CM

## 2014-04-24 DIAGNOSIS — Z87891 Personal history of nicotine dependence: Secondary | ICD-10-CM

## 2014-04-24 DIAGNOSIS — E876 Hypokalemia: Secondary | ICD-10-CM | POA: Diagnosis not present

## 2014-04-24 DIAGNOSIS — D72829 Elevated white blood cell count, unspecified: Secondary | ICD-10-CM | POA: Diagnosis present

## 2014-04-24 DIAGNOSIS — E870 Hyperosmolality and hypernatremia: Secondary | ICD-10-CM | POA: Diagnosis not present

## 2014-04-24 DIAGNOSIS — G4700 Insomnia, unspecified: Secondary | ICD-10-CM | POA: Diagnosis not present

## 2014-04-24 DIAGNOSIS — E669 Obesity, unspecified: Secondary | ICD-10-CM

## 2014-04-24 DIAGNOSIS — K219 Gastro-esophageal reflux disease without esophagitis: Secondary | ICD-10-CM | POA: Diagnosis present

## 2014-04-24 HISTORY — DX: Anxiety disorder, unspecified: F41.9

## 2014-04-24 LAB — CBC WITH DIFFERENTIAL/PLATELET
Basophils Absolute: 0 10*3/uL (ref 0.0–0.1)
Basophils Relative: 0 % (ref 0–1)
Eosinophils Absolute: 0 10*3/uL (ref 0.0–0.7)
Eosinophils Relative: 0 % (ref 0–5)
HCT: 40.5 % (ref 39.0–52.0)
Hemoglobin: 12.7 g/dL — ABNORMAL LOW (ref 13.0–17.0)
Lymphocytes Relative: 37 % (ref 12–46)
Lymphs Abs: 8.7 10*3/uL — ABNORMAL HIGH (ref 0.7–4.0)
MCH: 27.5 pg (ref 26.0–34.0)
MCHC: 31.4 g/dL (ref 30.0–36.0)
MCV: 87.7 fL (ref 78.0–100.0)
Monocytes Absolute: 3.3 10*3/uL — ABNORMAL HIGH (ref 0.1–1.0)
Monocytes Relative: 14 % — ABNORMAL HIGH (ref 3–12)
Neutro Abs: 11.5 10*3/uL — ABNORMAL HIGH (ref 1.7–7.7)
Neutrophils Relative %: 49 % (ref 43–77)
Platelets: 400 10*3/uL (ref 150–400)
RBC: 4.62 MIL/uL (ref 4.22–5.81)
RDW: 14.1 % (ref 11.5–15.5)
WBC: 23.5 10*3/uL — ABNORMAL HIGH (ref 4.0–10.5)

## 2014-04-24 LAB — BASIC METABOLIC PANEL
BUN: 16 mg/dL (ref 6–23)
CO2: 29 mEq/L (ref 19–32)
Calcium: 9.1 mg/dL (ref 8.4–10.5)
Chloride: 100 mEq/L (ref 96–112)
Creatinine, Ser: 0.93 mg/dL (ref 0.50–1.35)
GFR calc Af Amer: >90 mL/min (ref >90)
GFR calc non Af Amer: >90 mL/min (ref >90)
Glucose, Bld: 255 mg/dL — ABNORMAL HIGH (ref 70–99)
Potassium: 4.5 mEq/L (ref 3.7–5.3)
Sodium: 142 mEq/L (ref 137–147)

## 2014-04-24 LAB — URINALYSIS, ROUTINE W REFLEX MICROSCOPIC
Bilirubin Urine: NEGATIVE
Glucose, UA: NEGATIVE mg/dL
Ketones, ur: NEGATIVE mg/dL
Leukocytes, UA: NEGATIVE
Nitrite: NEGATIVE
Protein, ur: >300 mg/dL — AB
Specific Gravity, Urine: 1.034 — ABNORMAL HIGH (ref 1.005–1.030)
Urobilinogen, UA: 0.2 mg/dL (ref 0.0–1.0)
pH: 5.5 (ref 5.0–8.0)

## 2014-04-24 LAB — RAPID URINE DRUG SCREEN, HOSP PERFORMED
Amphetamines: NOT DETECTED
Barbiturates: NOT DETECTED
Benzodiazepines: POSITIVE — AB
Cocaine: NOT DETECTED
Opiates: NOT DETECTED
Tetrahydrocannabinol: NOT DETECTED

## 2014-04-24 LAB — GLUCOSE, CAPILLARY
Glucose-Capillary: 142 mg/dL — ABNORMAL HIGH (ref 70–99)
Glucose-Capillary: 163 mg/dL — ABNORMAL HIGH (ref 70–99)
Glucose-Capillary: 162 mg/dL — ABNORMAL HIGH (ref 70–99)
Glucose-Capillary: 133 mg/dL — ABNORMAL HIGH (ref 70–99)

## 2014-04-24 LAB — I-STAT ARTERIAL BLOOD GAS, ED
Acid-Base Excess: 4 mmol/L — ABNORMAL HIGH (ref 0.0–2.0)
Acid-Base Excess: 3 mmol/L — ABNORMAL HIGH (ref 0.0–2.0)
Bicarbonate: 35.8 mEq/L — ABNORMAL HIGH (ref 20.0–24.0)
Bicarbonate: 32 mEq/L — ABNORMAL HIGH (ref 20.0–24.0)
O2 Saturation: 100 %
O2 Saturation: 97 %
Patient temperature: 98.6
TCO2: 39 mmol/L (ref 0–100)
TCO2: 34 mmol/L (ref 0–100)
pCO2 arterial: 99.7 mmHg (ref 35.0–45.0)
pCO2 arterial: 70.2 mmHg (ref 35.0–45.0)
pH, Arterial: 7.163 — CL (ref 7.350–7.450)
pH, Arterial: 7.267 — ABNORMAL LOW (ref 7.350–7.450)
pO2, Arterial: 494 mmHg — ABNORMAL HIGH (ref 80.0–100.0)
pO2, Arterial: 108 mmHg — ABNORMAL HIGH (ref 80.0–100.0)

## 2014-04-24 LAB — I-STAT CG4 LACTIC ACID, ED: Lactic Acid, Venous: 2.47 mmol/L — ABNORMAL HIGH (ref 0.5–2.2)

## 2014-04-24 LAB — URINE MICROSCOPIC-ADD ON

## 2014-04-24 LAB — MRSA PCR SCREENING: MRSA by PCR: NEGATIVE

## 2014-04-24 LAB — I-STAT TROPONIN, ED: Troponin i, poc: 0.08 ng/mL (ref 0.00–0.08)

## 2014-04-24 MED ORDER — PROPOFOL 10 MG/ML IV EMUL
INTRAVENOUS | Status: AC
Start: 2014-04-24 — End: 2014-04-24
  Administered 2014-04-24: 1000 mg
  Filled 2014-04-24: qty 100

## 2014-04-24 MED ORDER — SUCCINYLCHOLINE CHLORIDE 20 MG/ML IJ SOLN
INTRAMUSCULAR | Status: AC
  Administered 2014-04-24: 110 mg
  Filled 2014-04-24: qty 1

## 2014-04-24 MED ORDER — FENTANYL CITRATE 0.05 MG/ML IJ SOLN: 100.0000 ug | INTRAMUSCULAR | Status: DC | PRN

## 2014-04-24 MED ORDER — ROCURONIUM BROMIDE 50 MG/5ML IV SOLN
INTRAVENOUS | Status: AC
  Filled 2014-04-24: qty 2

## 2014-04-24 MED ORDER — ALBUTEROL SULFATE (2.5 MG/3ML) 0.083% IN NEBU
2.5000 mg | INHALATION_SOLUTION | RESPIRATORY_TRACT | Status: DC | PRN
  Administered 2014-04-25 – 2014-04-27 (×3): 2.5 mg via RESPIRATORY_TRACT
  Filled 2014-04-24 (×3): qty 3

## 2014-04-24 MED ORDER — ETOMIDATE 2 MG/ML IV SOLN
INTRAVENOUS | Status: AC
  Administered 2014-04-24: 20 mg
  Filled 2014-04-24: qty 20

## 2014-04-24 MED ORDER — PANTOPRAZOLE SODIUM 40 MG PO PACK
40.0000 mg | PACK | ORAL | Status: DC
  Administered 2014-04-24 – 2014-04-26 (×3): 40 mg
  Filled 2014-04-24 (×5): qty 20

## 2014-04-24 MED ORDER — SODIUM CHLORIDE 0.9 % IV SOLN
INTRAVENOUS | Status: DC
  Administered 2014-04-24: 08:00:00 via INTRAVENOUS

## 2014-04-24 MED ORDER — BIOTENE DRY MOUTH MT LIQD
15.0000 mL | Freq: Four times a day (QID) | OROMUCOSAL | Status: DC
  Administered 2014-04-24 – 2014-04-29 (×21): 15 mL via OROMUCOSAL

## 2014-04-24 MED ORDER — MIDAZOLAM HCL 2 MG/2ML IJ SOLN
5.0000 mg | Freq: Once | INTRAMUSCULAR | Status: AC
  Administered 2014-04-24: 5 mg via INTRAVENOUS

## 2014-04-24 MED ORDER — SODIUM CHLORIDE 0.9 % IV SOLN
1.0000 mg/h | INTRAVENOUS | Status: DC
  Administered 2014-04-24: 1 mg/h via INTRAVENOUS
  Filled 2014-04-24: qty 10

## 2014-04-24 MED ORDER — MIDAZOLAM HCL 2 MG/2ML IJ SOLN
2.0000 mg | INTRAMUSCULAR | Status: DC | PRN
  Administered 2014-04-26: 2 mg via INTRAVENOUS
  Filled 2014-04-24: qty 2

## 2014-04-24 MED ORDER — HEPARIN SODIUM (PORCINE) 5000 UNIT/ML IJ SOLN
5000.0000 [IU] | Freq: Three times a day (TID) | INTRAMUSCULAR | Status: DC
  Administered 2014-04-24 – 2014-05-05 (×35): 5000 [IU] via SUBCUTANEOUS
  Filled 2014-04-24 (×38): qty 1

## 2014-04-24 MED ORDER — LEVOFLOXACIN IN D5W 500 MG/100ML IV SOLN
500.0000 mg | INTRAVENOUS | Status: AC
  Administered 2014-04-24 – 2014-04-27 (×4): 500 mg via INTRAVENOUS
  Filled 2014-04-24 (×5): qty 100

## 2014-04-24 MED ORDER — INSULIN ASPART 100 UNIT/ML ~~LOC~~ SOLN
0.0000 [IU] | SUBCUTANEOUS | Status: DC
  Administered 2014-04-24 (×2): 4 [IU] via SUBCUTANEOUS
  Administered 2014-04-24 – 2014-04-25 (×3): 3 [IU] via SUBCUTANEOUS
  Administered 2014-04-25 (×2): 4 [IU] via SUBCUTANEOUS
  Administered 2014-04-25 (×2): 3 [IU] via SUBCUTANEOUS
  Administered 2014-04-26 (×3): 4 [IU] via SUBCUTANEOUS
  Administered 2014-04-26: 3 [IU] via SUBCUTANEOUS
  Administered 2014-04-26 (×2): 4 [IU] via SUBCUTANEOUS
  Administered 2014-04-27: 7 [IU] via SUBCUTANEOUS
  Administered 2014-04-27 (×2): 4 [IU] via SUBCUTANEOUS

## 2014-04-24 MED ORDER — MIDAZOLAM HCL 2 MG/2ML IJ SOLN
2.0000 mg | INTRAMUSCULAR | Status: DC | PRN
  Administered 2014-04-24: 2 mg via INTRAVENOUS
  Filled 2014-04-24: qty 2

## 2014-04-24 MED ORDER — LIDOCAINE HCL (CARDIAC) 20 MG/ML IV SOLN
INTRAVENOUS | Status: AC
  Filled 2014-04-24: qty 5

## 2014-04-24 MED ORDER — DEXMEDETOMIDINE HCL IN NACL 200 MCG/50ML IV SOLN
0.0000 ug/kg/h | INTRAVENOUS | Status: DC
  Administered 2014-04-24: 0.5 ug/kg/h via INTRAVENOUS
  Administered 2014-04-24: 1 ug/kg/h via INTRAVENOUS
  Administered 2014-04-24: 0.3 ug/kg/h via INTRAVENOUS
  Administered 2014-04-25: 1.1 ug/kg/h via INTRAVENOUS
  Administered 2014-04-25: 0.8 ug/kg/h via INTRAVENOUS
  Filled 2014-04-24 (×5): qty 50

## 2014-04-24 MED ORDER — VITAL HIGH PROTEIN PO LIQD
1000.0000 mL | ORAL | Status: DC
  Administered 2014-04-25: 20:00:00
  Administered 2014-04-25 – 2014-04-26 (×2): 1000 mL
  Administered 2014-04-26: 04:00:00
  Administered 2014-04-27: 1000 mL
  Filled 2014-04-24 (×4): qty 1000

## 2014-04-24 MED ORDER — HYDRALAZINE HCL 20 MG/ML IJ SOLN
10.0000 mg | INTRAMUSCULAR | Status: DC | PRN
  Administered 2014-04-27 – 2014-04-28 (×3): 10 mg via INTRAVENOUS
  Filled 2014-04-24 (×2): qty 1

## 2014-04-24 MED ORDER — MIDAZOLAM HCL 2 MG/2ML IJ SOLN
INTRAMUSCULAR | Status: AC
  Filled 2014-04-24: qty 6

## 2014-04-24 MED ORDER — CHLORHEXIDINE GLUCONATE 0.12 % MT SOLN
15.0000 mL | Freq: Two times a day (BID) | OROMUCOSAL | Status: DC
  Administered 2014-04-24 – 2014-04-29 (×11): 15 mL via OROMUCOSAL
  Filled 2014-04-24 (×9): qty 15

## 2014-04-24 MED ORDER — SODIUM CHLORIDE 0.9 % IV SOLN
0.0000 ug/h | INTRAVENOUS | Status: DC
  Administered 2014-04-24: 20 ug/h via INTRAVENOUS
  Administered 2014-04-25 (×2): 200 ug/h via INTRAVENOUS
  Administered 2014-04-26: 300 ug/h via INTRAVENOUS
  Administered 2014-04-26: 200 ug/h via INTRAVENOUS
  Administered 2014-04-27: 300 ug/h via INTRAVENOUS
  Filled 2014-04-24 (×6): qty 50

## 2014-04-24 MED ORDER — IPRATROPIUM-ALBUTEROL 0.5-2.5 (3) MG/3ML IN SOLN
3.0000 mL | Freq: Four times a day (QID) | RESPIRATORY_TRACT | Status: DC
  Administered 2014-04-24 – 2014-04-25 (×5): 3 mL via RESPIRATORY_TRACT
  Filled 2014-04-24 (×5): qty 3

## 2014-04-24 MED ORDER — METHYLPREDNISOLONE SODIUM SUCC 40 MG IJ SOLR
40.0000 mg | Freq: Four times a day (QID) | INTRAMUSCULAR | Status: DC
  Administered 2014-04-24 – 2014-04-26 (×9): 40 mg via INTRAVENOUS
  Filled 2014-04-24 (×13): qty 1

## 2014-04-24 MED ORDER — VITAL HIGH PROTEIN PO LIQD: 1000.0000 mL | ORAL | Status: DC

## 2014-04-24 NOTE — Progress Notes (Signed)
Name: Isaac [name] MRN: [id number] DOB: 12-08-1955  ELECTRONIC ICU PHYSICIAN NOTE  Problem:  Air trapping on rate 14   Intervention:  Decrease rate to 10, keep pt sedated so he doesn't override the vent or double trigger   Tanda Rockers 04/24/2014, 7:16 PM

## 2014-04-24 NOTE — ED Notes (Signed)
Pt intubated with 7.5 tube.

## 2014-04-24 NOTE — ED Notes (Signed)
Dr Sharol Given preparing to intubate

## 2014-04-24 NOTE — Progress Notes (Signed)
Spoke with Isaac [name] in MRI, Mr. Guiney has 7 people ahead of him and he stated they would not get to him today. Fannie Knee stated that they would call the RN caring for Isaac [name] tomorrow to schedule a time to come in.

## 2014-04-24 NOTE — Progress Notes (Signed)
ANTIBIOTIC CONSULT NOTE - INITIAL  Pharmacy Consult for levaquin Indication: AECOPD  Allergies  Allergen Reactions  . Codeine Itching    "jittery"  . Codeine Sulfate   . Prozac [Fluoxetine Hcl]     confusion    Patient Measurements: Height: 5\' 11"  (180.3 cm) Weight: 298 lb (135.172 kg) IBW/kg (Calculated) : 75.3   Vital Signs: Temp: 97.8 F (36.6 C) (05/30 1137) Temp src: Oral (05/30 1137) BP: 78/52 mmHg (05/30 1137) Pulse Rate: 61 (05/30 1137) Intake/Output from previous day:   Intake/Output from this shift: Total I/O In: 69.4 [I.V.:69.4] Out: 35 [Urine:35]  Labs:  Recent Labs  04/24/14 0537  WBC 23.5*  HGB 12.7*  PLT 400  CREATININE 0.93   Estimated Creatinine Clearance: 121.6 ml/min (by C-G formula based on Cr of 0.93). No results found for this basename: VANCOTROUGH, VANCOPEAK, VANCORANDOM, GENTTROUGH, GENTPEAK, GENTRANDOM, TOBRATROUGH, TOBRAPEAK, TOBRARND, AMIKACINPEAK, AMIKACINTROU, AMIKACIN,  in the last 72 hours   Microbiology: No results found for this or any previous visit (from the past 720 hour(s)).  Medical History: Past Medical History  Diagnosis Date  . Hyperlipidemia   . Tobacco dependence   . Nephrolithiasis   . Hematuria   . COPD (chronic obstructive pulmonary disease)   . GERD (gastroesophageal reflux disease)   . Insomnia   . Hypertension     Medications:  Scheduled:  . antiseptic oral rinse  15 mL Mouth Rinse QID  . chlorhexidine  15 mL Mouth Rinse BID  . [START ON 04/25/2014] feeding supplement (VITAL HIGH PROTEIN)  1,000 mL Per Tube Q24H  . heparin subcutaneous  5,000 Units Subcutaneous 3 times per day  . insulin aspart  0-20 Units Subcutaneous 6 times per day  . ipratropium-albuterol  3 mL Nebulization Q6H  . lidocaine (cardiac) 100 mg/80ml      . methylPREDNISolone (SOLU-MEDROL) injection  40 mg Intravenous Q6H  . midazolam      . pantoprazole sodium  40 mg Per Tube Q24H  . rocuronium        Assessment: 58 yo male  with COPD to start levaquin. WBC= 23.2, afebrile, [place]= 0.93, CrCl > 100.   5/30 blood x2   Plan:  -Levaquin 500mg  IV q24hr -Will follow renal function, cultures and clinical progress  Hildred Laser, Pharm D 04/24/2014 12:50 PM

## 2014-04-24 NOTE — ED Notes (Signed)
The pt is still agitated

## 2014-04-24 NOTE — Progress Notes (Signed)
  Echocardiogram 2D Echocardiogram has been performed.  [place] 04/24/2014, 4:11 PM

## 2014-04-24 NOTE — ED Notes (Signed)
Versed increased  Per order of dr Sharol Given.  Pt fighting

## 2014-04-24 NOTE — ED Notes (Signed)
Dr. Sharol Given at the bedside to speak with family members.

## 2014-04-24 NOTE — ED Notes (Signed)
The pt  Appears more comfortable after the change of meds from propofol to versed and fentanyl.  His bp has  Gone back to normal

## 2014-04-24 NOTE — ED Provider Notes (Signed)
CSN: [id number]     Arrival date & time 04/24/14  0503 History   First MD Initiated Contact with Patient 04/24/14 [phone]     Chief Complaint  Patient presents with  . Shortness of Breath     (Consider location/radiation/quality/duration/timing/severity/associated sxs/prior Treatment) HPI 58 year old male presents to the emergency room from home via EMS in respiratory distress.  Patient has history of COPD.  He was recently seen by his primary care Dr.  Over the last 3-4 days he has had worsening shortness of breath.  His pulmonologist called him in prednisone which normally helps turn him around.  EMS reports upon arrival, his sats were in knees.  Patient was diaphoretic and tachypneic working hard to breathe.  He was placed on CPAP with EMS.  Patient was reluctant to come to the hospital but EMS and wife was insistent.  No prior history of intubation.  No fevers or chills.  Limited information from patient secondary to respiratory distress Past Medical History  Diagnosis Date  . Hyperlipidemia   . Tobacco dependence   . Nephrolithiasis   . Hematuria   . COPD (chronic obstructive pulmonary disease)   . GERD (gastroesophageal reflux disease)   . Insomnia   . Hypertension    Past Surgical History  Procedure Laterality Date  . Lithotripsy     Family History  Problem Relation Age of Onset  . Heart disease Father   . Stroke Mother   . Leukemia Brother    History  Substance Use Topics  . Smoking status: Former Smoker -- 1.00 packs/day for 35 years    Types: Cigarettes    Quit date: 11/26/2010  . Smokeless tobacco: Not on file  . Alcohol Use: No    Review of Systems  Unable to perform ROS: Acuity of condition      Allergies  Codeine; Codeine sulfate; and Prozac  Home Medications   Prior to Admission medications   Medication Sig Start Date End Date Taking? Authorizing Provider  albuterol (PROVENTIL) (2.5 MG/3ML) 0.083% nebulizer solution Take 3 mLs (2.5 mg total) by  nebulization 4 (four) times daily. 07/23/12 07/23/13  Tammy S Parrett, NP  azelastine (ASTELIN) 0.1 % nasal spray Place into both nostrils as needed.    Historical Provider, MD  benzonatate (TESSALON) 200 MG capsule Take 1 capsule by mouth Three times daily as needed. 06/23/12   Historical Provider, MD  budesonide (PULMICORT) 0.5 MG/2ML nebulizer solution Take 2 mLs (0.5 mg total) by nebulization 2 (two) times daily. 07/23/12 07/23/13  Tammy S Parrett, NP  cloNIDine (CATAPRES) 0.2 MG tablet Take 1 tablet (0.2 mg total) by mouth 3 (three) times daily. 07/09/12 07/09/13  Erick Colace, NP  doxycycline (VIBRA-TABS) 100 MG tablet Take 2 tablets today then 1 tablet daily till gone. 04/22/14   Deneise Lever, MD  fluticasone (FLONASE) 50 MCG/ACT nasal spray Place 2 sprays into the nose daily. 07/09/12 07/09/13  Erick Colace, NP  ipratropium (ATROVENT) 0.02 % nebulizer solution Take 2.5 mLs (0.5 mg total) by nebulization 4 (four) times daily. 07/23/12 07/23/13  Tammy S Parrett, NP  losartan-hydrochlorothiazide (HYZAAR) 50-12.5 MG per tablet Take 1 tablet by mouth daily.    Historical Provider, MD  montelukast (SINGULAIR) 10 MG tablet Take 10 mg by mouth at bedtime.    Historical Provider, MD  omeprazole (PRILOSEC) 20 MG capsule Take 2 capsules (40 mg total) by mouth daily. For GERD. 07/09/12   Erick Colace, NP  predniSONE (DELTASONE) 10 MG tablet Take  4 tab for 2 days, 3 tab for 2 days, 2 tab for 2 tabs, 1 tab for 2 days and then stop 04/22/14   Deneise Lever, MD  sertraline (ZOLOFT) 25 MG tablet Take 1 tablet by mouth daily. 03/09/13   Historical Provider, MD  tamsulosin (FLOMAX) 0.4 MG CAPS capsule Take 1 capsule by mouth daily.    Historical Provider, MD  VENTOLIN HFA 108 (90 BASE) MCG/ACT inhaler INHALE 2 PUFFS EVERY 6 HOURS AS NEEDED 09/22/13   Brand Males, MD   BP 122/88  Pulse 113  Temp(Src) 98.5 F (36.9 C)  Resp 16  Ht 5\' 11"  (1.803 m)  Wt 298 lb (135.172 kg)  BMI 41.58 kg/m2  SpO2  98% Physical Exam  Nursing note and vitals reviewed. Constitutional: He is oriented to person, place, and time. He appears distressed.  HENT:  Head: Normocephalic and atraumatic.  Nose: Nose normal.  Mouth/Throat: Oropharynx is clear and moist.  Eyes: Conjunctivae and EOM are normal. Pupils are equal, round, and reactive to light.  Neck: Normal range of motion. Neck supple. No JVD present. No tracheal deviation present. No thyromegaly present.  Cardiovascular: Regular rhythm, normal heart sounds and intact distal pulses.  Exam reveals no gallop and no friction rub.   No murmur heard. Tachycardia and hypertension noted  Pulmonary/Chest: No stridor. He is in respiratory distress. He has wheezes. He has no rales. He exhibits no tenderness.  The patient has tachypnea, prolonged expiratory phase with wheezing is now moving air well.  He is using accessory muscles but appears fatigued  Abdominal: Soft. Bowel sounds are normal. He exhibits no distension and no mass. There is no tenderness. There is no rebound and no guarding.  Musculoskeletal: Normal range of motion. He exhibits edema. He exhibits no tenderness.  Lymphadenopathy:    He has no cervical adenopathy.  Neurological: He is alert and oriented to person, place, and time. He exhibits normal muscle tone. Coordination normal.  Skin: Skin is warm. No rash noted. He is diaphoretic. No erythema. There is pallor (patient is gray, and grayish blue in color).  Psychiatric: He has a normal mood and affect.    ED Course  INTUBATION Date/Time: 04/24/2014 8:12 AM Performed by: Kalman Drape Authorized by: Kalman Drape Consent: Verbal consent obtained. The procedure was performed in an emergent situation. Risks and benefits: risks, benefits and alternatives were discussed Indications: respiratory failure Intubation method: video-assisted Patient status: paralyzed (RSI) Preoxygenation: BVM (BiPAP) Sedatives: etomidate Paralytic:  succinylcholine Laryngoscope size: Miller 4 Tube size: 7.5 mm Tube type: cuffed Number of attempts: 1 Cricoid pressure: no Cords visualized: yes Post-procedure assessment: chest rise and CO2 detector Breath sounds: equal Cuff inflated: yes ETT to lip: 23 cm Tube secured with: ETT holder Chest x-ray interpreted by me and radiologist. Chest x-ray findings: endotracheal tube in appropriate position   (including critical care time)  CRITICAL CARE Performed by: Kalman Drape Total critical care time: 90 min Critical care time was exclusive of separately billable procedures and treating other patients. Critical care was necessary to treat or prevent imminent or life-threatening deterioration. Critical care was time spent personally by me on the following activities: development of treatment plan with patient and/or surrogate as well as nursing, discussions with consultants, evaluation of patient's response to treatment, examination of patient, obtaining history from patient or surrogate, ordering and performing treatments and interventions, ordering and review of laboratory studies, ordering and review of radiographic studies, pulse oximetry and re-evaluation of patient's condition.  Labs Review Labs Reviewed  CBC WITH DIFFERENTIAL - Abnormal; Notable for the following:    WBC 23.5 (*)    Hemoglobin 12.7 (*)    Monocytes Relative 14 (*)    Neutro Abs 11.5 (*)    Lymphs Abs 8.7 (*)    Monocytes Absolute 3.3 (*)    All other components within normal limits  BASIC METABOLIC PANEL - Abnormal; Notable for the following:    Glucose, Bld 255 (*)    All other components within normal limits  URINALYSIS, ROUTINE W REFLEX MICROSCOPIC - Abnormal; Notable for the following:    Specific Gravity, Urine 1.034 (*)    Hgb urine dipstick SMALL (*)    Protein, ur >300 (*)    All other components within normal limits  URINE MICROSCOPIC-ADD ON - Abnormal; Notable for the following:    Squamous  Epithelial / LPF FEW (*)    Bacteria, UA MANY (*)    All other components within normal limits  I-STAT CG4 LACTIC ACID, ED - Abnormal; Notable for the following:    Lactic Acid, Venous 2.47 (*)    All other components within normal limits  I-STAT ARTERIAL BLOOD GAS, ED - Abnormal; Notable for the following:    pH, Arterial 7.163 (*)    pCO2 arterial 99.7 (*)    pO2, Arterial 494.0 (*)    Bicarbonate 35.8 (*)    Acid-Base Excess 4.0 (*)    All other components within normal limits  I-STAT ARTERIAL BLOOD GAS, ED - Abnormal; Notable for the following:    pH, Arterial 7.267 (*)    pCO2 arterial 70.2 (*)    pO2, Arterial 108.0 (*)    Bicarbonate 32.0 (*)    Acid-Base Excess 3.0 (*)    All other components within normal limits  CULTURE, BLOOD (ROUTINE X 2)  CULTURE, BLOOD (ROUTINE X 2)  MRSA PCR SCREENING  URINE RAPID DRUG SCREEN (HOSP PERFORMED)  I-STAT TROPOININ, ED    Imaging Review Dg Chest Port 1 View  04/24/2014   CLINICAL DATA:  Endotracheal tube placed  EXAM: PORTABLE CHEST - 1 VIEW  COMPARISON:  07/04/2012  FINDINGS: Endotracheal tube ends at the level of the mid thoracic trachea. The enteric tube enters the stomach. Mild cardiomegaly which is chronic. Pulmonary hyperinflation without evidence of edema, consolidation, effusion, or pneumothorax.  IMPRESSION: Endotracheal and orogastric tubes are in good position.   Electronically Signed   By: Jorje Guild M.D.   On: 04/24/2014 06:30     EKG Interpretation   Date/Time:  Saturday Apr 24 2014 05:29:07 EDT Ventricular Rate:  143 PR Interval:  130 QRS Duration: 98 QT Interval:  292 QTC Calculation: 450 R Axis:   101 Text Interpretation:  Sinus tachycardia Rightward axis Pulmonary disease  pattern Abnormal ECG Confirmed by Ellyanna Holton  MD, Mervin Ramires ([id number]) on 04/24/2014  8:14:16 AM      MDM   Final diagnoses:  Respiratory failure  COPD exacerbation    58 yo male with respiratory distress, failure due to COPD exacerbation,  hypercarbia.  Pt easily intubated, but have had some trouble keeping him sedated appropriately.  Case d/w critical care who will see/admit.  Family updated on findings and plan.    Kalman Drape, MD 04/24/14 [phone]

## 2014-04-24 NOTE — ED Notes (Signed)
#  16 og by Jacklyn Shell rn

## 2014-04-24 NOTE — Progress Notes (Signed)
INITIAL NUTRITION ASSESSMENT  DOCUMENTATION CODES Per approved criteria  -Morbid Obesity   INTERVENTION: - Initiate TF via OGT of Vital High Protein start at 4ml/hr increase by 22ml every 4 hours to goal of 46ml/hr. This will provide 1800 calories, 158g protein, and 1566ml free water and meet 75% estimated calorie needs (23 kcal/kg ideal body weight) and 101% estimated protein needs. If IVF d/c, recommend 161ml water flushes BID - Will order adult enteral protocol - RD to continue to monitor   NUTRITION DIAGNOSIS: Inadequate oral intake related to inability to eat as evidenced by NPO, mechanical ventilation.   Goal: Enteral nutrition to provide 60-70% of estimated calorie needs (22-25 kcals/kg ideal body weight) and 100% of estimated protein needs, based on ASPEN guidelines for permissive underfeeding in critically ill obese individuals   Monitor:  Weights, labs, TF tolerance, vent status  Reason for Assessment: Consult for TF management  58 y.o. male  Admitting Dx: Acute respiratory failure  ASSESSMENT: Pt with severe COPD, had progressive dyspnea for 3-4 days and was intubated in ED for acute hypercapnic/hypoxic respiratory failure.  Patient is currently intubated on ventilator support MV: 9.7 L/min Temp (24hrs), Avg:98.6 F (37 C), Min:98.5 F (36.9 C), Max:98.6 F (37 C)  Propofol: off   Height: Ht Readings from Last 1 Encounters:  04/24/14 5\' 11"  (1.803 m)    Weight: Wt Readings from Last 1 Encounters:  04/24/14 298 lb (135.172 kg)    Ideal Body Weight: 172 lbs  % Ideal Body Weight: 173%  Wt Readings from Last 10 Encounters:  04/24/14 298 lb (135.172 kg)  04/12/14 298 lb (135.172 kg)  03/26/13 286 lb (129.729 kg)  12/08/12 285 lb 3.2 oz (129.366 kg)  09/05/12 272 lb 9.6 oz (123.651 kg)  08/20/12 262 lb 6.4 oz (119.024 kg)  07/23/12 257 lb (116.574 kg)  07/09/12 274 lb 7.6 oz (124.5 kg)  04/25/12 272 lb 12.8 oz (123.741 kg)    Usual Body Weight:  286 lbs earlier this month  % Usual Body Weight: 104%  BMI:  Body mass index is 41.58 kg/(m^2). Class III extreme obesity  Estimated Nutritional Needs: Kcal: 2373 Protein: 156-195g Fluid: per MD  Skin: Intact  Diet Order:  NPO  EDUCATION NEEDS: -No education needs identified at this time  No intake or output data in the 24 hours ending 04/24/14 0911  Last BM: PTA  Labs:   Recent Labs Lab 04/24/14 0537  NA 142  K 4.5  CL 100  CO2 29  BUN 16  CREATININE 0.93  CALCIUM 9.1  GLUCOSE 255*    CBG (last 3)  No results found for this basename: GLUCAP,  in the last 72 hours  Scheduled Meds: . antiseptic oral rinse  15 mL Mouth Rinse QID  . chlorhexidine  15 mL Mouth Rinse BID  . feeding supplement (VITAL HIGH PROTEIN)  1,000 mL Per Tube Q24H  . heparin subcutaneous  5,000 Units Subcutaneous 3 times per day  . insulin aspart  0-20 Units Subcutaneous 6 times per day  . ipratropium-albuterol  3 mL Nebulization Q6H  . lidocaine (cardiac) 100 mg/63ml      . methylPREDNISolone (SOLU-MEDROL) injection  40 mg Intravenous Q6H  . midazolam      . pantoprazole sodium  40 mg Per Tube Q24H  . rocuronium        Continuous Infusions: . sodium chloride 50 mL/hr at 04/24/14 0730  . dexmedetomidine 0.5 mcg/kg/hr (04/24/14 0837)  . fentaNYL infusion INTRAVENOUS 30 mcg/hr (04/24/14  0833)    Past Medical History  Diagnosis Date  . Hyperlipidemia   . Tobacco dependence   . Nephrolithiasis   . Hematuria   . COPD (chronic obstructive pulmonary disease)   . GERD (gastroesophageal reflux disease)   . Insomnia   . Hypertension     Past Surgical History  Procedure Laterality Date  . Lithotripsy      Mikey College MS, RD, LDN [phone] Weekend/After Hours Pager

## 2014-04-24 NOTE — ED Notes (Signed)
The pt arrived by gems from home.  Sob for 2 days.  He saw his doctor yesterday.  He arrived on  c-pap

## 2014-04-24 NOTE — ED Notes (Signed)
The pt became agitated again.  5 mg of versed bolus and 50 mcgs of fentanyl  Per order of dr Sharol Given in the room

## 2014-04-24 NOTE — ED Notes (Signed)
Dr Sharol Given given a copy of lactic acid results 2.47

## 2014-04-24 NOTE — ED Notes (Signed)
The pt has been bolused   withfentanyl 50 mcg and versed 5mg   Per order of dr Sharol Given for agitation

## 2014-04-24 NOTE — ED Notes (Signed)
Family at bedside. 

## 2014-04-24 NOTE — ED Notes (Signed)
Etomidate 20 mg iv by hollu rn.. And succ 120mg  iv per [facility] rn

## 2014-04-24 NOTE — Progress Notes (Signed)
Updated family at bedside.  They have informed me that pt recently was seen by ENT Benjamine Mola) for chronic sinus infection.  There was concern for aneurysm on CT head as outpt, and he was scheduled for MRI >> will order this as inpt.  Chesley Mires, MD [facility] Pulmonary/Critical Care 04/24/2014, 11:11 AM Pager:  [phone] After 3pm call: [phone]

## 2014-04-24 NOTE — H&P (Signed)
PULMONARY / CRITICAL CARE MEDICINE   Name: Isaac [name] MRN: [id number] DOB: 10/11/1956    ADMISSION DATE:  04/24/2014  REFERRING MD :  Sharol Given  CHIEF COMPLAINT:  Cant breath  BRIEF PATIENT DESCRIPTION:  58 yo male former smoker with progressive dyspnea from AECOPD.  Intubated in ED.  Followed by Dr. Chase Caller.  SIGNIFICANT EVENTS: 5/30 Admit  STUDIES:    LINES / TUBES: ETT  CULTURES: Sputum  ANTIBIOTICS: Levaquin 5/30 >>   HISTORY OF PRESENT ILLNESS:   Hx from chart.  Pt followed by Dr. Chase Caller for severe COPD.  She had progressive dyspnea.  In ED she was in extremis and had acute hypercapnic/hypoxic respiratory failure.  She required intubation.  PCCM asked to admit.  PAST MEDICAL HISTORY :  Past Medical History  Diagnosis Date  . Hyperlipidemia   . Tobacco dependence   . Nephrolithiasis   . Hematuria   . COPD (chronic obstructive pulmonary disease)   . GERD (gastroesophageal reflux disease)   . Insomnia   . Hypertension    Past Surgical History  Procedure Laterality Date  . Lithotripsy     Prior to Admission medications   Medication Sig Start Date End Date Taking? Authorizing Provider  albuterol (PROVENTIL) (2.5 MG/3ML) 0.083% nebulizer solution Take 3 mLs (2.5 mg total) by nebulization 4 (four) times daily. 07/23/12 07/23/13  Tammy S Parrett, NP  azelastine (ASTELIN) 0.1 % nasal spray Place into both nostrils as needed.    Historical Provider, MD  benzonatate (TESSALON) 200 MG capsule Take 1 capsule by mouth Three times daily as needed. 06/23/12   Historical Provider, MD  budesonide (PULMICORT) 0.5 MG/2ML nebulizer solution Take 2 mLs (0.5 mg total) by nebulization 2 (two) times daily. 07/23/12 07/23/13  Tammy S Parrett, NP  cloNIDine (CATAPRES) 0.2 MG tablet Take 1 tablet (0.2 mg total) by mouth 3 (three) times daily. 07/09/12 07/09/13  Erick Colace, NP  doxycycline (VIBRA-TABS) 100 MG tablet Take 2 tablets today then 1 tablet daily till gone. 04/22/14    Deneise Lever, MD  fluticasone (FLONASE) 50 MCG/ACT nasal spray Place 2 sprays into the nose daily. 07/09/12 07/09/13  Erick Colace, NP  ipratropium (ATROVENT) 0.02 % nebulizer solution Take 2.5 mLs (0.5 mg total) by nebulization 4 (four) times daily. 07/23/12 07/23/13  Tammy S Parrett, NP  losartan-hydrochlorothiazide (HYZAAR) 50-12.5 MG per tablet Take 1 tablet by mouth daily.    Historical Provider, MD  montelukast (SINGULAIR) 10 MG tablet Take 10 mg by mouth at bedtime.    Historical Provider, MD  omeprazole (PRILOSEC) 20 MG capsule Take 2 capsules (40 mg total) by mouth daily. For GERD. 07/09/12   Erick Colace, NP  predniSONE (DELTASONE) 10 MG tablet Take 4 tab for 2 days, 3 tab for 2 days, 2 tab for 2 tabs, 1 tab for 2 days and then stop 04/22/14   Deneise Lever, MD  sertraline (ZOLOFT) 25 MG tablet Take 1 tablet by mouth daily. 03/09/13   Historical Provider, MD  tamsulosin (FLOMAX) 0.4 MG CAPS capsule Take 1 capsule by mouth daily.    Historical Provider, MD  VENTOLIN HFA 108 (90 BASE) MCG/ACT inhaler INHALE 2 PUFFS EVERY 6 HOURS AS NEEDED 09/22/13   Brand Males, MD   Allergies  Allergen Reactions  . Codeine Itching    "jittery"  . Codeine Sulfate   . Prozac [Fluoxetine Hcl]     confusion    FAMILY HISTORY:  Family History  Problem Relation Age of  Onset  . Heart disease Father   . Stroke Mother   . Leukemia Brother    SOCIAL HISTORY:  reports that he quit smoking about 3 years ago. His smoking use included Cigarettes. He has a 35 pack-year smoking history. He does not have any smokeless tobacco history on file. He reports that he does not drink alcohol or use illicit drugs.  REVIEW OF SYSTEMS:   Unable to obtain.  SUBJECTIVE:   VITAL SIGNS: Temp:  [98.5 F (36.9 C)] 98.5 F (36.9 C) (05/30 0633) Pulse Rate:  [113-150] 113 (05/30 0653) Resp:  [24-28] 24 (05/30 0523) BP: (82-193)/(60-151) 121/92 mmHg (05/30 0653) SpO2:  [96 %-100 %] 97 % (05/30 0653) FiO2 (%):   [40 %-100 %] 40 % (05/30 0600) Weight:  [298 lb (135.172 kg)] 298 lb (135.172 kg) (05/30 0508) HEMODYNAMICS:   VENTILATOR SETTINGS: Vent Mode:  [-] PRVC FiO2 (%):  [40 %-100 %] 40 % Set Rate:  [16 bmp-25 bmp] 25 bmp Vt Set:  [600 mL] 600 mL PEEP:  [5 cmH20] 5 cmH20 Plateau Pressure:  [28 cmH20] 28 cmH20 INTAKE / OUTPUT: Intake/Output   None     PHYSICAL EXAMINATION: General: Ill appearing Neuro:  RASS -4 HEENT:  Pupils reactive, ETT/OG in place Cardiovascular:  Regular, tachycardic Lungs:  Prolonged exhalation, faint b/l wheeze Abdomen:  Soft, non tender Musculoskeletal:  Ankle edema Skin:  No rashes  LABS:  CBC  Recent Labs Lab 04/24/14 0537  WBC 23.5*  HGB 12.7*  HCT 40.5  PLT 400   BMET  Recent Labs Lab 04/24/14 0537  NA 142  K 4.5  CL 100  CO2 29  BUN 16  CREATININE 0.93  GLUCOSE 255*   Electrolytes  Recent Labs Lab 04/24/14 0537  CALCIUM 9.1   Sepsis Markers  Recent Labs Lab 04/24/14 0603  LATICACIDVEN 2.47*   ABG  Recent Labs Lab 04/24/14 0555  PHART 7.163*  PCO2ART 99.7*  PO2ART 494.0*   Liver Enzymes No results found for this basename: AST, ALT, ALKPHOS, BILITOT, ALBUMIN,  in the last 168 hours Cardiac Enzymes No results found for this basename: TROPONINI, PROBNP,  in the last 168 hours Glucose No results found for this basename: GLUCAP,  in the last 168 hours  Imaging Dg Chest Port 1 View  04/24/2014   CLINICAL DATA:  Endotracheal tube placed  EXAM: PORTABLE CHEST - 1 VIEW  COMPARISON:  07/04/2012  FINDINGS: Endotracheal tube ends at the level of the mid thoracic trachea. The enteric tube enters the stomach. Mild cardiomegaly which is chronic. Pulmonary hyperinflation without evidence of edema, consolidation, effusion, or pneumothorax.  IMPRESSION: Endotracheal and orogastric tubes are in good position.   Electronically Signed   By: Jorje Guild M.D.   On: 04/24/2014 06:30    ASSESSMENT / PLAN:  PULMONARY A: Acute  on chronic respiratory failure 2nd to AECOPD. P:   Full vent support >> adjust settings to avoid PEEPi Solumedrol 40 mg q6hr Scheduled BD's F/u CXR, ABG  CARDIOVASCULAR A:  Hx of HTN. P:  Hold hyzaar PRN IV hydralazine for SBP > 170 Monitor hemodynamics Check Echo  RENAL A:   No acute issues. P:   Monitor renal fx, urine outpt, electrolytes  GASTROINTESTINAL A:   Nutrition. Hx of GERD. P:   Tube feeds Protonix  HEMATOLOGIC A:   Leukocytosis. P:  F/u CBC with Diff SQ heparin for DVT prophylaxis  INFECTIOUS A:   AECOPD. P:   Levaquin  ENDOCRINE A:   Steroid  induced hyperglycemia.   P:   SSI  NEUROLOGIC A:   Acute encephalopathy 2nd to respiratory failure. P:   RASS goal: -1 Check UDS  No family at bedside.  CC time 40 minutes.  Chesley Mires, MD [facility] Pulmonary/Critical Care 04/24/2014, 7:27 AM Pager:  [phone] After 3pm call: [phone]

## 2014-04-25 ENCOUNTER — Inpatient Hospital Stay (HOSPITAL_COMMUNITY): Payer: 59

## 2014-04-25 LAB — BLOOD GAS, ARTERIAL
Acid-Base Excess: 7.5 mmol/L — ABNORMAL HIGH (ref 0.0–2.0)
Bicarbonate: 33.5 mEq/L — ABNORMAL HIGH (ref 20.0–24.0)
FIO2: 0.4 %
MECHVT: 600 mL
O2 Saturation: 98.7 %
PEEP: 5 cmH2O
Patient temperature: 98.6
RATE: 14 resp/min
TCO2: 35.6 mmol/L (ref 0–100)
pCO2 arterial: 67.8 mmHg (ref 35.0–45.0)
pH, Arterial: 7.315 — ABNORMAL LOW (ref 7.350–7.450)
pO2, Arterial: 99.3 mmHg (ref 80.0–100.0)

## 2014-04-25 LAB — GLUCOSE, CAPILLARY
Glucose-Capillary: 147 mg/dL — ABNORMAL HIGH (ref 70–99)
Glucose-Capillary: 145 mg/dL — ABNORMAL HIGH (ref 70–99)
Glucose-Capillary: 188 mg/dL — ABNORMAL HIGH (ref 70–99)
Glucose-Capillary: 154 mg/dL — ABNORMAL HIGH (ref 70–99)
Glucose-Capillary: 127 mg/dL — ABNORMAL HIGH (ref 70–99)

## 2014-04-25 LAB — CBC WITH DIFFERENTIAL/PLATELET
Basophils Absolute: 0 10*3/uL (ref 0.0–0.1)
Basophils Relative: 0 % (ref 0–1)
Eosinophils Absolute: 0 10*3/uL (ref 0.0–0.7)
Eosinophils Relative: 0 % (ref 0–5)
HCT: 37.5 % — ABNORMAL LOW (ref 39.0–52.0)
Hemoglobin: 11.4 g/dL — ABNORMAL LOW (ref 13.0–17.0)
Lymphocytes Relative: 14 % (ref 12–46)
Lymphs Abs: 1.8 10*3/uL (ref 0.7–4.0)
MCH: 26.7 pg (ref 26.0–34.0)
MCHC: 30.4 g/dL (ref 30.0–36.0)
MCV: 87.8 fL (ref 78.0–100.0)
Monocytes Absolute: 1.1 10*3/uL — ABNORMAL HIGH (ref 0.1–1.0)
Monocytes Relative: 9 % (ref 3–12)
Neutro Abs: 9.8 10*3/uL — ABNORMAL HIGH (ref 1.7–7.7)
Neutrophils Relative %: 77 % (ref 43–77)
Platelets: 319 10*3/uL (ref 150–400)
RBC: 4.27 MIL/uL (ref 4.22–5.81)
RDW: 14.4 % (ref 11.5–15.5)
WBC: 12.7 10*3/uL — ABNORMAL HIGH (ref 4.0–10.5)

## 2014-04-25 LAB — COMPREHENSIVE METABOLIC PANEL
ALT: 32 U/L (ref 0–53)
AST: 32 U/L (ref 0–37)
Albumin: 3.5 g/dL (ref 3.5–5.2)
Alkaline Phosphatase: 76 U/L (ref 39–117)
BUN: 21 mg/dL (ref 6–23)
CO2: 32 mEq/L (ref 19–32)
Calcium: 8.6 mg/dL (ref 8.4–10.5)
Chloride: 100 mEq/L (ref 96–112)
Creatinine, Ser: 0.88 mg/dL (ref 0.50–1.35)
GFR calc Af Amer: >90 mL/min (ref >90)
GFR calc non Af Amer: >90 mL/min (ref >90)
Glucose, Bld: 158 mg/dL — ABNORMAL HIGH (ref 70–99)
Potassium: 4.2 mEq/L (ref 3.7–5.3)
Sodium: 142 mEq/L (ref 137–147)
Total Bilirubin: <0.2 mg/dL — ABNORMAL LOW (ref 0.3–1.2)
Total Protein: 6.6 g/dL (ref 6.0–8.3)

## 2014-04-25 LAB — MAGNESIUM: Magnesium: 2.1 mg/dL (ref 1.5–2.5)

## 2014-04-25 LAB — PHOSPHORUS: Phosphorus: 3.2 mg/dL (ref 2.3–4.6)

## 2014-04-25 MED ORDER — IPRATROPIUM BROMIDE 0.02 % IN SOLN
0.5000 mg | Freq: Four times a day (QID) | RESPIRATORY_TRACT | Status: DC
  Administered 2014-04-25 – 2014-05-05 (×39): 0.5 mg via RESPIRATORY_TRACT
  Filled 2014-04-25 (×39): qty 2.5

## 2014-04-25 MED ORDER — BUDESONIDE 0.25 MG/2ML IN SUSP
0.2500 mg | Freq: Two times a day (BID) | RESPIRATORY_TRACT | Status: DC
Start: 2014-04-25 — End: 2014-05-05
  Administered 2014-04-25 – 2014-05-05 (×20): 0.25 mg via RESPIRATORY_TRACT
  Filled 2014-04-25 (×22): qty 2

## 2014-04-25 MED ORDER — DEXMEDETOMIDINE HCL IN NACL 400 MCG/100ML IV SOLN
0.4000 ug/kg/h | INTRAVENOUS | Status: AC
  Administered 2014-04-25: 0.6 ug/kg/h via INTRAVENOUS
  Administered 2014-04-25 (×2): 0.8 ug/kg/h via INTRAVENOUS
  Administered 2014-04-26 (×2): 1.1 ug/kg/h via INTRAVENOUS
  Administered 2014-04-26: 1 ug/kg/h via INTRAVENOUS
  Administered 2014-04-26: 1.1 ug/kg/h via INTRAVENOUS
  Administered 2014-04-26: 0.7 ug/kg/h via INTRAVENOUS
  Administered 2014-04-26: 1.1 ug/kg/h via INTRAVENOUS
  Administered 2014-04-27: 0.9 ug/kg/h via INTRAVENOUS
  Administered 2014-04-27 (×2): 1.1 ug/kg/h via INTRAVENOUS
  Filled 2014-04-25 (×3): qty 100
  Filled 2014-04-25 (×2): qty 200
  Filled 2014-04-25 (×4): qty 100
  Filled 2014-04-25: qty 200

## 2014-04-25 MED ORDER — POTASSIUM CHLORIDE 20 MEQ/15ML (10%) PO LIQD
40.0000 meq | Freq: Once | ORAL | Status: AC
Start: 2014-04-25 — End: 2014-04-25
  Administered 2014-04-25: 40 meq
  Filled 2014-04-25: qty 30

## 2014-04-25 MED ORDER — FUROSEMIDE 10 MG/ML IJ SOLN
40.0000 mg | Freq: Once | INTRAMUSCULAR | Status: AC
  Administered 2014-04-25: 40 mg via INTRAVENOUS
  Filled 2014-04-25: qty 4

## 2014-04-25 MED ORDER — ARFORMOTEROL TARTRATE 15 MCG/2ML IN NEBU
15.0000 ug | INHALATION_SOLUTION | Freq: Two times a day (BID) | RESPIRATORY_TRACT | Status: DC
  Administered 2014-04-25 – 2014-05-05 (×20): 15 ug via RESPIRATORY_TRACT
  Filled 2014-04-25 (×27): qty 2

## 2014-04-25 NOTE — Progress Notes (Addendum)
PULMONARY / CRITICAL CARE MEDICINE   Name: Isaac [name] MRN: [id number] DOB: 03/24/1956    ADMISSION DATE:  04/24/2014  REFERRING MD :  Sharol Given  CHIEF COMPLAINT:  Cant breath  BRIEF PATIENT DESCRIPTION:  58 yo male former smoker with progressive dyspnea from AECOPD.  Intubated in ED.  Followed by Dr. Chase Caller.  SIGNIFICANT EVENTS: 5/30 Admit  STUDIES:    LINES / TUBES: ETT 5/30>>>  CULTURES: Sputum 5/30>>>  ANTIBIOTICS: Levaquin 5/30 >>     SUBJECTIVE:  Very SOB w/ marked accessory muscle use during SBT  VITAL SIGNS: Temp:  [97.8 F (36.6 C)-99.1 F (37.3 C)] 98.4 F (36.9 C) (05/31 0807) Pulse Rate:  [47-106] 100 (05/31 0755) Resp:  [9-44] 16 (05/31 0755) BP: (78-165)/(52-107) 139/80 mmHg (05/31 0700) SpO2:  [90 %-100 %] 90 % (05/31 0755) FiO2 (%):  [40 %-50 %] 50 % (05/31 0755) HEMODYNAMICS:   VENTILATOR SETTINGS: Vent Mode:  [-] CPAP;PSV FiO2 (%):  [40 %-50 %] 50 % Set Rate:  [10 bmp-14 bmp] 10 bmp Vt Set:  [600 mL] 600 mL PEEP:  [5 cmH20] 5 cmH20 Pressure Support:  [5 cmH20] 5 cmH20 Plateau Pressure:  [26 cmH20-30 cmH20] 29 cmH20 INTAKE / OUTPUT: Intake/Output     05/30 0701 - 05/31 0700 05/31 0701 - 06/01 0700   I.V. (mL/kg) 1379.6 (10.2)    IV Piggyback 100    Total Intake(mL/kg) 1479.6 (10.9)    Urine (mL/kg/hr) 895 (0.3)    Total Output 895     Net +584.6            PHYSICAL EXAMINATION: General: Ill appearing, marked accessory muscle use  Neuro:  RASS 0 this am  HEENT:  Pupils reactive, ETT/OG in place Cardiovascular:  Regular, tachycardic Lungs:  Prolonged exhalation, distant and prolonged exp wheeze, marked accessory muscle use  Abdomen:  Soft, non tender Musculoskeletal:  Ankle edema Skin:  No rashes  LABS:  CBC  Recent Labs Lab 04/24/14 0537 04/25/14 0621  WBC 23.5* 12.7*  HGB 12.7* 11.4*  HCT 40.5 37.5*  PLT 400 319   BMET  Recent Labs Lab 04/24/14 0537 04/25/14 0621  NA 142 142  K 4.5 4.2  CL 100 100   CO2 29 32  BUN 16 21  CREATININE 0.93 0.88  GLUCOSE 255* 158*   Electrolytes  Recent Labs Lab 04/24/14 0537 04/25/14 0621  CALCIUM 9.1 8.6  MG  --  2.1  PHOS  --  3.2   Sepsis Markers  Recent Labs Lab 04/24/14 0603  LATICACIDVEN 2.47*   ABG  Recent Labs Lab 04/24/14 0555 04/24/14 0718 04/25/14 0412  PHART 7.163* 7.267* 7.315*  PCO2ART 99.7* 70.2* 67.8*  PO2ART 494.0* 108.0* 99.3   Liver Enzymes  Recent Labs Lab 04/25/14 0621  AST 32  ALT 32  ALKPHOS 76  BILITOT <0.2*  ALBUMIN 3.5   Cardiac Enzymes No results found for this basename: TROPONINI, PROBNP,  in the last 168 hours Glucose  Recent Labs Lab 04/24/14 0959 04/24/14 1122 04/24/14 1548 04/24/14 1919 04/25/14 0349 04/25/14 0710  GLUCAP 142* 163* 162* 133* 147* 145*    Imaging Dg Chest Port 1 View  04/25/2014   CLINICAL DATA:  COPD  EXAM: PORTABLE CHEST - 1 VIEW  COMPARISON:  07/04/2012, 04/24/2014  FINDINGS: Endotracheal tube with the tip 5 cm above the carina. Nasogastric tube coursing below the diaphragm excluded from the field of view.  The lungs are hyperinflated likely secondary to COPD. No focal consolidation, pleural effusion  or pneumothorax. Stable cardiomediastinal silhouette. Unremarkable osseous structures.  IMPRESSION: Endotracheal tube with the tip 5 cm above the carina.  COPD.  No active cardiopulmonary disease.   Electronically Signed   By: Kathreen Devoid   On: 04/25/2014 08:21   Dg Chest Port 1 View  04/24/2014   CLINICAL DATA:  Endotracheal tube placed  EXAM: PORTABLE CHEST - 1 VIEW  COMPARISON:  07/04/2012  FINDINGS: Endotracheal tube ends at the level of the mid thoracic trachea. The enteric tube enters the stomach. Mild cardiomegaly which is chronic. Pulmonary hyperinflation without evidence of edema, consolidation, effusion, or pneumothorax.  IMPRESSION: Endotracheal and orogastric tubes are in good position.   Electronically Signed   By: Jorje Guild M.D.   On: 04/24/2014  06:30  5/31: no acute findings   ASSESSMENT / PLAN:  PULMONARY A: Acute on chronic respiratory failure 2nd to AECOPD. P:   Full vent support, avoid air trapping  Solumedrol 40 mg q6hr Scheduled BD's 1 time lasix  PAD protocol (see neuro section re: sedation goal) F/u CXR, ABG  CARDIOVASCULAR A:  Hx of HTN. P:  Hold hyzaar PRN IV hydralazine for SBP > 170 Monitor hemodynamics f/u Echo 1x lasix   RENAL A:   No acute issues. P:   Monitor renal fx, urine outpt, electrolytes  GASTROINTESTINAL A:   Nutrition. Hx of GERD. P:   Tube feeds Protonix  HEMATOLOGIC A:   Leukocytosis-->improved  P:  F/u CBC SQ heparin for DVT prophylaxis  INFECTIOUS A:   AECOPD. P:   Levaquin  ENDOCRINE A:   Steroid induced hyperglycemia.   P:   SSI  NEUROLOGIC A:   Acute encephalopathy 2nd to respiratory failure. H/o possible cerebral aneurysm.  improved P:   RASS goal: -2 Supportive care MRI brain pending    Not ready for extubation. Still w/ sig bronchospasm and air trapping. Marked accessory muscle use during his SBT this am. Needs more time  Marni Griffon ACNP-BC [facility] Pager # [phone] OR # [phone] if no answer  Reviewed above, examined.  Still with increase respiratory secretions.  Not ready for vent weaning yet.  Mental status improved.  Updated family at bedside.  CC time 35 minutes.  Chesley Mires, MD [facility] Pulmonary/Critical Care 04/25/2014, 11:58 AM Pager:  [phone] After 3pm call: [phone]

## 2014-04-25 NOTE — Progress Notes (Signed)
Transported pt on vent to MRI

## 2014-04-26 ENCOUNTER — Inpatient Hospital Stay (HOSPITAL_COMMUNITY): Payer: 59

## 2014-04-26 LAB — POCT I-STAT 3, ART BLOOD GAS (G3+)
Acid-Base Excess: 8 mmol/L — ABNORMAL HIGH (ref 0.0–2.0)
Bicarbonate: 37 mEq/L — ABNORMAL HIGH (ref 20.0–24.0)
O2 Saturation: 97 %
Patient temperature: 98.6
TCO2: 39 mmol/L (ref 0–100)
pCO2 arterial: 75.6 mmHg (ref 35.0–45.0)
pH, Arterial: 7.297 — ABNORMAL LOW (ref 7.350–7.450)
pO2, Arterial: 105 mmHg — ABNORMAL HIGH (ref 80.0–100.0)

## 2014-04-26 LAB — CBC
HCT: 35.6 % — ABNORMAL LOW (ref 39.0–52.0)
Hemoglobin: 10.5 g/dL — ABNORMAL LOW (ref 13.0–17.0)
MCH: 26.3 pg (ref 26.0–34.0)
MCHC: 29.5 g/dL — ABNORMAL LOW (ref 30.0–36.0)
MCV: 89.2 fL (ref 78.0–100.0)
Platelets: 268 10*3/uL (ref 150–400)
RBC: 3.99 MIL/uL — ABNORMAL LOW (ref 4.22–5.81)
RDW: 14.6 % (ref 11.5–15.5)
WBC: 9.8 10*3/uL (ref 4.0–10.5)

## 2014-04-26 LAB — BASIC METABOLIC PANEL
BUN: 29 mg/dL — ABNORMAL HIGH (ref 6–23)
CO2: 33 mEq/L — ABNORMAL HIGH (ref 19–32)
Calcium: 8.4 mg/dL (ref 8.4–10.5)
Chloride: 105 mEq/L (ref 96–112)
Creatinine, Ser: 1 mg/dL (ref 0.50–1.35)
GFR calc Af Amer: >90 mL/min (ref >90)
GFR calc non Af Amer: 81 mL/min — ABNORMAL LOW (ref >90)
Glucose, Bld: 180 mg/dL — ABNORMAL HIGH (ref 70–99)
Potassium: 4.8 mEq/L (ref 3.7–5.3)
Sodium: 145 mEq/L (ref 137–147)

## 2014-04-26 LAB — BLOOD GAS, ARTERIAL
Acid-Base Excess: 6.8 mmol/L — ABNORMAL HIGH (ref 0.0–2.0)
Bicarbonate: 33.7 mEq/L — ABNORMAL HIGH (ref 20.0–24.0)
Drawn by: 40530
FIO2: 0.4 %
MECHVT: 600 mL
O2 Saturation: 97.6 %
PEEP: 5 cmH2O
Patient temperature: 99
RATE: 10 resp/min
TCO2: 36.1 mmol/L (ref 0–100)
pCO2 arterial: 80.3 mmHg (ref 35.0–45.0)
pH, Arterial: 7.248 — ABNORMAL LOW (ref 7.350–7.450)
pO2, Arterial: 116 mmHg — ABNORMAL HIGH (ref 80.0–100.0)

## 2014-04-26 LAB — GLUCOSE, CAPILLARY
Glucose-Capillary: 160 mg/dL — ABNORMAL HIGH (ref 70–99)
Glucose-Capillary: 160 mg/dL — ABNORMAL HIGH (ref 70–99)
Glucose-Capillary: 164 mg/dL — ABNORMAL HIGH (ref 70–99)
Glucose-Capillary: 164 mg/dL — ABNORMAL HIGH (ref 70–99)
Glucose-Capillary: 165 mg/dL — ABNORMAL HIGH (ref 70–99)
Glucose-Capillary: 179 mg/dL — ABNORMAL HIGH (ref 70–99)

## 2014-04-26 LAB — PATHOLOGIST SMEAR REVIEW

## 2014-04-26 MED ORDER — POLYETHYLENE GLYCOL 3350 17 G PO PACK
17.0000 g | PACK | Freq: Every day | ORAL | Status: DC
  Administered 2014-04-26 – 2014-05-05 (×5): 17 g via ORAL
  Filled 2014-04-26 (×10): qty 1

## 2014-04-26 MED ORDER — FUROSEMIDE 10 MG/ML IJ SOLN
40.0000 mg | Freq: Four times a day (QID) | INTRAMUSCULAR | Status: AC
  Administered 2014-04-26 (×2): 40 mg via INTRAVENOUS
  Filled 2014-04-26 (×2): qty 4

## 2014-04-26 MED ORDER — METHYLPREDNISOLONE SODIUM SUCC 40 MG IJ SOLR
40.0000 mg | Freq: Two times a day (BID) | INTRAMUSCULAR | Status: DC
  Administered 2014-04-26 – 2014-04-28 (×4): 40 mg via INTRAVENOUS
  Filled 2014-04-26 (×6): qty 1

## 2014-04-26 NOTE — Progress Notes (Addendum)
PULMONARY / CRITICAL CARE MEDICINE   Name: Isaac [name] MRN: [id number] DOB: 1956-03-31    ADMISSION DATE:  04/24/2014  REFERRING MD :  Sharol Given  CHIEF COMPLAINT:  SOB  BRIEF PATIENT DESCRIPTION:  58 yo male former smoker with progressive dyspnea from AECOPD.  Intubated in ED.  Followed by Dr. Chase Caller.  SIGNIFICANT EVENTS: 5/30 Admit 6/01 continues to need ventilator support  STUDIES:  2D echo 5/30: EF 70%, abnormal left ventricular relaxation (grade 1 diastolic dysfunction). MRI Brain 5/31: Mild exophthalmos, paranasal sinus thickening, partial mastoid opacification b/l  LINES / TUBES: ETT 5/30>>>  CULTURES: Sputum 5/30: negative Blood Cultures x2 5/30: >>>  ANTIBIOTICS: Levaquin 5/30 >>>  SUBJECTIVE:  Improving SOB   VITAL SIGNS: Temp:  [97.7 F (36.5 C)-99 F (37.2 C)] 99 F (37.2 C) (06/01 0400) Pulse Rate:  [58-112] 68 (06/01 0700) Resp:  [8-28] 8 (06/01 0700) BP: (80-158)/(52-99) 128/75 mmHg (06/01 0700) SpO2:  [94 %-99 %] 99 % (06/01 0815) FiO2 (%):  [40 %-50 %] 40 % (06/01 0815) Weight:  [133.7 kg (294 lb 12.1 oz)] 133.7 kg (294 lb 12.1 oz) (05/31 2000) VENTILATOR SETTINGS: Vent Mode:  [-] PRVC FiO2 (%):  [40 %-50 %] 40 % Set Rate:  [10 bmp-14 bmp] 14 bmp Vt Set:  [600 mL] 600 mL PEEP:  [5 cmH20] 5 cmH20 Plateau Pressure:  [19 cmH20-26 cmH20] 23 cmH20 INTAKE / OUTPUT: Intake/Output     05/31 0701 - 06/01 0700 06/01 0701 - 06/02 0700   I.V. (mL/kg) 1960.6 (14.7)    NG/GT 985    IV Piggyback 100    Total Intake(mL/kg) 3045.6 (22.8)    Urine (mL/kg/hr) 1450 (0.5)    Total Output 1450     Net +1595.6            PHYSICAL EXAMINATION: General: Ill appearing intubated caucasian male Neuro:  RASS 0 this am  HEENT:  Pupils reactive, ETT/OG in place Cardiovascular:  RRR Lungs: Diminished throughout.  Abdomen:  Soft, non tender Musculoskeletal:  2+ lower extremity edema Skin:  No rashes  LABS:  CBC  Recent Labs Lab 04/24/14 0537  04/25/14 0621 04/26/14 0310  WBC 23.5* 12.7* 9.8  HGB 12.7* 11.4* 10.5*  HCT 40.5 37.5* 35.6*  PLT 400 319 268   BMET  Recent Labs Lab 04/24/14 0537 04/25/14 0621 04/26/14 0310  NA 142 142 145  K 4.5 4.2 4.8  CL 100 100 105  CO2 29 32 33*  BUN 16 21 29*  CREATININE 0.93 0.88 1.00  GLUCOSE 255* 158* 180*   Electrolytes  Recent Labs Lab 04/24/14 0537 04/25/14 0621 04/26/14 0310  CALCIUM 9.1 8.6 8.4  MG  --  2.1  --   PHOS  --  3.2  --    Sepsis Markers  Recent Labs Lab 04/24/14 0603  LATICACIDVEN 2.47*   ABG  Recent Labs Lab 04/25/14 0412 04/26/14 0455 04/26/14 0828  PHART 7.315* 7.248* 7.297*  PCO2ART 67.8* 80.3* 75.6*  PO2ART 99.3 116.0* 105.0*   Liver Enzymes  Recent Labs Lab 04/25/14 0621  AST 32  ALT 32  ALKPHOS 76  BILITOT <0.2*  ALBUMIN 3.5   Glucose  Recent Labs Lab 04/25/14 0710 04/25/14 1134 04/25/14 1458 04/25/14 1916 04/25/14 2332 04/26/14 0326  GLUCAP 145* 188* 154* 127* 164* 164*    Imaging  Mr Brain Wo Contrast  04/25/2014   CLINICAL DATA:  Altered mental status. Hypertensive hyperlipidemic patient. COPD.  EXAM: MRI HEAD WITHOUT CONTRAST  TECHNIQUE: Multiplanar, multiecho  pulse sequences of the brain and surrounding structures were obtained without intravenous contrast.  COMPARISON:  07/31/2012 CT.  No comparison MR.  FINDINGS: No acute infarct.  No intracranial hemorrhage.  No intracranial mass lesion noted on this unenhanced exam.  No hydrocephalus.  Mild exophthalmos.  Asymmetric sella with down sloping on the right similar to the prior CT.  Paranasal sinus mild mucosal thickening.  Partial opacification mastoid air cells bilaterally. Prominence of soft tissue in the posterior superior nasopharynx may represent adenoidal tissue contributing to eustachian tube dysfunction. Limited for evaluating for mucosal abnormality at this level. Patient is intubated.  Cervical medullary junction unremarkable.  Major intracranial  vascular structures are patent  IMPRESSION: No acute infarct.  No intracranial hemorrhage.  No intracranial mass lesion noted on this unenhanced exam.  Mild exophthalmos.  Asymmetric sella with down sloping on the right similar to the prior CT.  Paranasal sinus mild mucosal thickening.  Partial opacification mastoid air cells bilaterally.  Please see above.   Electronically Signed   By: Chauncey Cruel M.D.   On: 04/25/2014 18:48   Dg Chest Port 1 View  04/26/2014   CLINICAL DATA:  Hypoxia  EXAM: PORTABLE CHEST - 1 VIEW  COMPARISON:  Apr 25, 2014  FINDINGS: Endotracheal tube tip is 5.0 cm above the carina. Nasogastric tube tip and side port are below the diaphragm. No apparent pneumothorax.  There is underlying emphysematous change. Interstitium is mildly prominent ; there may be mild interstitial edema superimposed. The heart is mildly enlarged. The pulmonary vascularity is within normal limits. No adenopathy.  IMPRESSION: Tube positions as described without pneumothorax. Underlying emphysema. Suspect a degree of congestive heart failure superimposed on emphysematous change. There is no appreciable airspace consolidation.   Electronically Signed   By: Lowella Grip M.D.   On: 04/26/2014 07:07   Dg Chest Port 1 View  04/25/2014   CLINICAL DATA:  COPD  EXAM: PORTABLE CHEST - 1 VIEW  COMPARISON:  07/04/2012, 04/24/2014  FINDINGS: Endotracheal tube with the tip 5 cm above the carina. Nasogastric tube coursing below the diaphragm excluded from the field of view.  The lungs are hyperinflated likely secondary to COPD. No focal consolidation, pleural effusion or pneumothorax. Stable cardiomediastinal silhouette. Unremarkable osseous structures.  IMPRESSION: Endotracheal tube with the tip 5 cm above the carina.  COPD.  No active cardiopulmonary disease.   Electronically Signed   By: Kathreen Devoid   On: 04/25/2014 08:21    ASSESSMENT / PLAN:  PULMONARY A: Acute on chronic respiratory failure 2nd to AECOPD, acute  on chronic respiratory acidosis, moderate subglottic secretions, worsening cxr pulm edema (gd I d-dysfxn) P:   Full vent support, avoid air trapping, allow for permissive hypercarbia  Solumedrol 40 mg q12h Scheduled BD's Lasix 40 mg IV Q6h x2 doses PAD protocol (see neuro section re: sedation goal) F/u CXR  CARDIOVASCULAR A:  Hx of HTN with acute on chronic diastolic dysfx. P:  Hold hyzaar PRN IV hydralazine for SBP > 170 Monitor hemodynamics Lasix 40 mg IV Q6h x2 doses  RENAL A:   Positive fluid balance of +2,298 P: Lasix 40 mg IV Q6h x2 doses KVO maintnance IV fluids Monitor renal fx, electrolytes  GASTROINTESTINAL A:   Nutrition. Hx of GERD. No BM in 3 days P:   Tube feeds Protonix Initiate Miralax  HEMATOLOGIC A:   Leukocytosis-->improving P:  F/u CBC SQ heparin for DVT prophylaxis  INFECTIOUS A:   AECOPD. P:   Levaquin  ENDOCRINE A:  Steroid induced hyperglycemia.   P:   SSI  NEUROLOGIC A:   Acute encephalopathy 2nd to respiratory failure. improved P: RASS goal: -1 Supportive care   Not ready for extubation. Worsening CXR this am, moderate subglottic secretions, diminished lung sounds throughout, persistent resp. acidosis on ABG. Needs more time. However, patient seems more alert today and is requesting to have the tube out. Updated wife at bedside.   Reviewed above, examined pt, and agree with assessment/plan.  Slowly improving, but still has ways to go before being ready for extubation.  Will diurese again.    CC timed 35 minutes.  Chesley Mires, MD [facility] Pulmonary/Critical Care 04/26/2014, 1:46 PM Pager:  [phone] After 3pm call: [phone]

## 2014-04-26 NOTE — Clinical Documentation Improvement (Signed)
Possible Clinical Conditions?   Septicemia / Sepsis Severe Sepsis Neutropenic Sepsis  SIRS Septic Shock Sepsis with UTI Sepsis due to an internal device  Bacterial infection of unknown etiology / source Other Condition  Cannot clinically Determine    Risk Factors: Patient was seen by ENT for chronic sinus infection per 5/30 progress notes.  Labs: 5/30: Lactic acid: 2.47.   Thank You, Theron Arista, Clinical Documentation Specialist:  [phone]  [facility] [employer]

## 2014-04-26 NOTE — Progress Notes (Signed)
UR Completed.  Isaac [name] [id number] 04/26/2014

## 2014-04-26 NOTE — Progress Notes (Signed)
CRITICAL VALUE ALERT  Critical value received:  PH 7.24 Date of notification:  04/26/2014  Time of notification:  0530  Critical value read back:yes  Nurse who received alert:  Osvaldo Shipper RN  MD notified (1st page):  Nelda Marseille MD  Time of first page:  0530  MD notified (2nd page):  Time of second page:  Responding MD:  Nelda Marseille MD  Time MD responded:  [phone]

## 2014-04-27 ENCOUNTER — Inpatient Hospital Stay (HOSPITAL_COMMUNITY): Payer: 59

## 2014-04-27 LAB — CBC
HCT: 38.2 % — ABNORMAL LOW (ref 39.0–52.0)
Hemoglobin: 11.2 g/dL — ABNORMAL LOW (ref 13.0–17.0)
MCH: 26.5 pg (ref 26.0–34.0)
MCHC: 29.3 g/dL — ABNORMAL LOW (ref 30.0–36.0)
MCV: 90.5 fL (ref 78.0–100.0)
Platelets: 272 10*3/uL (ref 150–400)
RBC: 4.22 MIL/uL (ref 4.22–5.81)
RDW: 14.4 % (ref 11.5–15.5)
WBC: 9.5 10*3/uL (ref 4.0–10.5)

## 2014-04-27 LAB — GLUCOSE, CAPILLARY
Glucose-Capillary: 132 mg/dL — ABNORMAL HIGH (ref 70–99)
Glucose-Capillary: 136 mg/dL — ABNORMAL HIGH (ref 70–99)
Glucose-Capillary: 138 mg/dL — ABNORMAL HIGH (ref 70–99)
Glucose-Capillary: 159 mg/dL — ABNORMAL HIGH (ref 70–99)
Glucose-Capillary: 172 mg/dL — ABNORMAL HIGH (ref 70–99)
Glucose-Capillary: 204 mg/dL — ABNORMAL HIGH (ref 70–99)

## 2014-04-27 LAB — BASIC METABOLIC PANEL
BUN: 36 mg/dL — ABNORMAL HIGH (ref 6–23)
CO2: 38 mEq/L — ABNORMAL HIGH (ref 19–32)
Calcium: 8.9 mg/dL (ref 8.4–10.5)
Chloride: 102 mEq/L (ref 96–112)
Creatinine, Ser: 0.97 mg/dL (ref 0.50–1.35)
GFR calc Af Amer: >90 mL/min (ref >90)
GFR calc non Af Amer: 89 mL/min — ABNORMAL LOW (ref >90)
Glucose, Bld: 183 mg/dL — ABNORMAL HIGH (ref 70–99)
Potassium: 4.6 mEq/L (ref 3.7–5.3)
Sodium: 148 mEq/L — ABNORMAL HIGH (ref 137–147)

## 2014-04-27 LAB — TSH: TSH: 0.415 u[IU]/mL (ref 0.350–4.500)

## 2014-04-27 MED ORDER — SERTRALINE HCL 25 MG PO TABS
25.0000 mg | ORAL_TABLET | Freq: Every day | ORAL | Status: DC
  Administered 2014-04-27 – 2014-05-05 (×9): 25 mg via ORAL
  Filled 2014-04-27 (×9): qty 1

## 2014-04-27 MED ORDER — TAMSULOSIN HCL 0.4 MG PO CAPS
0.4000 mg | ORAL_CAPSULE | Freq: Every day | ORAL | Status: DC
Start: 2014-04-27 — End: 2014-05-05
  Administered 2014-04-27 – 2014-05-05 (×9): 0.4 mg via ORAL
  Filled 2014-04-27 (×9): qty 1

## 2014-04-27 MED ORDER — DEXMEDETOMIDINE HCL IN NACL 200 MCG/50ML IV SOLN: 0.4000 ug/kg/h | INTRAVENOUS | Status: DC

## 2014-04-27 MED ORDER — FUROSEMIDE 10 MG/ML IJ SOLN
40.0000 mg | Freq: Once | INTRAMUSCULAR | Status: AC
  Administered 2014-04-27: 40 mg via INTRAVENOUS
  Filled 2014-04-27: qty 4

## 2014-04-27 MED ORDER — PANTOPRAZOLE SODIUM 40 MG PO TBEC
40.0000 mg | DELAYED_RELEASE_TABLET | Freq: Every day | ORAL | Status: DC
  Administered 2014-04-27 – 2014-05-05 (×9): 40 mg via ORAL
  Filled 2014-04-27 (×9): qty 1

## 2014-04-27 MED ORDER — LOSARTAN POTASSIUM-HCTZ 50-12.5 MG PO TABS: 1.0000 | ORAL_TABLET | Freq: Every day | ORAL | Status: DC

## 2014-04-27 MED ORDER — ALPRAZOLAM 0.25 MG PO TABS
0.2500 mg | ORAL_TABLET | ORAL | Status: DC | PRN
  Administered 2014-04-27 – 2014-05-04 (×10): 0.25 mg via ORAL
  Filled 2014-04-27 (×10): qty 1

## 2014-04-27 MED ORDER — CLONIDINE HCL 0.2 MG PO TABS: 0.2000 mg | ORAL_TABLET | Freq: Three times a day (TID) | ORAL | Status: DC

## 2014-04-27 MED ORDER — HYDROCHLOROTHIAZIDE 12.5 MG PO CAPS
12.5000 mg | ORAL_CAPSULE | Freq: Every day | ORAL | Status: DC
  Administered 2014-04-27 – 2014-05-05 (×9): 12.5 mg via ORAL
  Filled 2014-04-27 (×9): qty 1

## 2014-04-27 MED ORDER — CLONIDINE HCL 0.2 MG PO TABS
0.2000 mg | ORAL_TABLET | Freq: Three times a day (TID) | ORAL | Status: DC
  Administered 2014-04-27 – 2014-05-05 (×24): 0.2 mg via ORAL
  Filled 2014-04-27 (×28): qty 1

## 2014-04-27 MED ORDER — LOSARTAN POTASSIUM 50 MG PO TABS
50.0000 mg | ORAL_TABLET | Freq: Every day | ORAL | Status: DC
  Administered 2014-04-27 – 2014-05-05 (×9): 50 mg via ORAL
  Filled 2014-04-27 (×9): qty 1

## 2014-04-27 MED ORDER — INSULIN ASPART 100 UNIT/ML ~~LOC~~ SOLN
0.0000 [IU] | Freq: Three times a day (TID) | SUBCUTANEOUS | Status: DC
  Administered 2014-04-27 (×2): 3 [IU] via SUBCUTANEOUS
  Administered 2014-04-28 (×3): 4 [IU] via SUBCUTANEOUS
  Administered 2014-04-29 – 2014-04-30 (×2): 3 [IU] via SUBCUTANEOUS
  Administered 2014-04-30 – 2014-05-01 (×2): 4 [IU] via SUBCUTANEOUS
  Administered 2014-05-02: 3 [IU] via SUBCUTANEOUS
  Administered 2014-05-03: 4 [IU] via SUBCUTANEOUS
  Administered 2014-05-05: 3 [IU] via SUBCUTANEOUS

## 2014-04-27 NOTE — Progress Notes (Signed)
ANTIBIOTIC CONSULT NOTE - FOLLOW UP  Pharmacy Consult for Levaquin Indication: AECOPD  Allergies  Allergen Reactions  . Codeine Itching    "jittery"  . Codeine Sulfate   . Prozac [Fluoxetine Hcl]     confusion  Patient Measurements: Height: 5\' 11"  (180.3 cm) Weight: 294 lb 12.1 oz (133.7 kg) IBW/kg (Calculated) : 75.3 Vital Signs: Temp: 98.9 F (37.2 C) (06/02 0900) Temp src: Oral (06/02 0900) BP: 125/75 mmHg (06/02 0600) Pulse Rate: 58 (06/02 0600) Intake/Output from previous day: 06/01 0701 - 06/02 0700 In: 3342.3 [I.V.:1517.3; NG/GT:1725; IV Piggyback:100] Out: 3310 [Urine:3310] Labs:  Recent Labs  04/25/14 0621 04/26/14 0310 04/27/14 0335  WBC 12.7* 9.8 9.5  HGB 11.4* 10.5* 11.2*  PLT 319 268 272  CREATININE 0.88 1.00 0.97   Estimated Creatinine Clearance: 115.9 ml/min (by C-G formula based on Cr of 0.97).   Microbiology: Recent Results (from the past 720 hour(s))  CULTURE, BLOOD (ROUTINE X 2)     Status: None   Collection Time    04/24/14  5:45 AM      Result Value Ref Range Status   Specimen Description BLOOD LEFT ARM   Final   Special Requests BOTTLES DRAWN AEROBIC AND ANAEROBIC 10CC   Final   Culture  Setup Time     Final   Value: 04/24/2014 11:23     Performed at [facility]   Culture     Final   Value:        BLOOD CULTURE RECEIVED NO GROWTH TO DATE CULTURE WILL BE HELD FOR 5 DAYS BEFORE ISSUING A FINAL NEGATIVE REPORT     Performed at [facility]   Report Status PENDING   Incomplete  CULTURE, BLOOD (ROUTINE X 2)     Status: None   Collection Time    04/24/14  5:55 AM      Result Value Ref Range Status   Specimen Description BLOOD LEFT HAND   Final   Special Requests BOTTLES DRAWN AEROBIC AND ANAEROBIC 10CC   Final   Culture  Setup Time     Final   Value: 04/24/2014 11:23     Performed at [facility]   Culture     Final   Value:        BLOOD CULTURE RECEIVED NO GROWTH TO DATE CULTURE WILL BE HELD FOR 5 DAYS BEFORE  ISSUING A FINAL NEGATIVE REPORT     Performed at [facility]   Report Status PENDING   Incomplete  MRSA PCR SCREENING     Status: None   Collection Time    04/24/14 10:00 AM      Result Value Ref Range Status   MRSA by PCR NEGATIVE  NEGATIVE Final   Comment:            The GeneXpert MRSA Assay (FDA     approved for NASAL specimens     only), is one component of a     comprehensive MRSA colonization     surveillance program. It is not     intended to diagnose MRSA     infection nor to guide or     monitor treatment for     MRSA infections.    Anti-infectives   Start     Dose/Rate Route Frequency Ordered Stop   04/24/14 1400  levofloxacin (LEVAQUIN) IVPB 500 mg     500 mg 100 mL/hr over 60 Minutes Intravenous Every 24 hours 04/24/14 1251  Assessment: 21 YOM on Levaquin day # 3 for AECOPD. WBC is within normal limits. Afebrile. Blood cultures without growth to date. SCr stable.   Goal of Therapy:  Clinical resolution of infection  Plan:  Continue Levaquin 500mg  IV q24h.   Sloan Leiter, PharmD, BCPS Clinical Pharmacist [phone] 04/27/2014,9:25 AM

## 2014-04-27 NOTE — Progress Notes (Signed)
[facility] Progress Note Patient Name: [name] DOB: [birth year] MRN: [id number]  Date of Service  04/27/2014   HPI/Events of Note  Severe anxiety with difficulty tolerating NPPV   eICU Interventions  Low dose PRN Xanax   Intervention Category Intermediate Interventions: Change in mental status - evaluation and management  Wilhelmina Mcardle 04/27/2014, 10:42 PM

## 2014-04-27 NOTE — Procedures (Signed)
Extubation Procedure Note  Patient Details:   Name: Isaac [name] DOB: 03-Nov-1956 MRN: [id number]   Airway Documentation:     Evaluation  O2 sats: stable throughout Complications: No apparent complications Patient did tolerate procedure well. Bilateral Breath Sounds: Diminished Suctioning: Airway Yes  [name] 04/27/2014, 8:59 AM

## 2014-04-27 NOTE — Progress Notes (Signed)
PULMONARY / CRITICAL CARE MEDICINE   Name: ABDEL EFFINGER MRN: [id number] DOB: Aug 12, 1956    ADMISSION DATE:  04/24/2014  REFERRING MD :  Sharol Given  CHIEF COMPLAINT:  SOB  BRIEF PATIENT DESCRIPTION:  58 yo male former smoker with progressive dyspnea from AECOPD.  Intubated in ED.  Followed by Dr. Chase Caller.  SIGNIFICANT EVENTS: 5/30 Admit 6/01 continues to need ventilator support 6/02 Extubated  STUDIES:  2D echo 5/30: EF 70%, abnormal left ventricular relaxation (grade 1 diastolic dysfunction). MRI Brain 5/31: Mild exophthalmos, paranasal sinus thickening, partial mastoid opacification b/l  LINES / TUBES: ETT 5/30>>>6/02  CULTURES: Sputum 5/30: negative Blood Cultures x2 5/30: >>>  ANTIBIOTICS: Levaquin 5/30 >>> Stop date 6/05?   SUBJECTIVE:  Improving SOB   VITAL SIGNS: Temp:  [98.1 F (36.7 C)-99.7 F (37.6 C)] 98.8 F (37.1 C) (06/02 0400) Pulse Rate:  [58-107] 58 (06/02 0600) Resp:  [8-27] 10 (06/02 0600) BP: (107-149)/(60-92) 125/75 mmHg (06/02 0600) SpO2:  [96 %-100 %] 99 % (06/02 0600) FiO2 (%):  [40 %] 40 % (06/02 0400) VENTILATOR SETTINGS: Vent Mode:  [-] PRVC FiO2 (%):  [40 %] 40 % Set Rate:  [10 bmp-16 bmp] 10 bmp Vt Set:  [600 mL] 600 mL PEEP:  [5 cmH20] 5 cmH20 Pressure Support:  [8 cmH20] 8 cmH20 Plateau Pressure:  [23 ASN05-39 cmH20] 34 cmH20 INTAKE / OUTPUT: Intake/Output     06/01 0701 - 06/02 0700 06/02 0701 - 06/03 0700   I.V. (mL/kg) 1517.3 (11.3)    NG/GT 1725    IV Piggyback 100    Total Intake(mL/kg) 3342.3 (25)    Urine (mL/kg/hr) 3310 (1)    Total Output 3310     Net +32.3            PHYSICAL EXAMINATION: General: Well appearing intubated caucasian male Neuro:  RASS 0 this am  HEENT:  Pupils reactive, ETT/OG in place Cardiovascular:  RRR Lungs: Diminished throughout.  Abdomen:  Soft, non tender Musculoskeletal:  1+ lower extremity edema Skin:  No rashes  LABS:  CBC  Recent Labs Lab 04/25/14 0621 04/26/14 0310  04/27/14 0335  WBC 12.7* 9.8 9.5  HGB 11.4* 10.5* 11.2*  HCT 37.5* 35.6* 38.2*  PLT 319 268 272   BMET  Recent Labs Lab 04/25/14 0621 04/26/14 0310 04/27/14 0335  NA 142 145 148*  K 4.2 4.8 4.6  CL 100 105 102  CO2 32 33* 38*  BUN 21 29* 36*  CREATININE 0.88 1.00 0.97  GLUCOSE 158* 180* 183*   Electrolytes  Recent Labs Lab 04/25/14 0621 04/26/14 0310 04/27/14 0335  CALCIUM 8.6 8.4 8.9  MG 2.1  --   --   PHOS 3.2  --   --    Sepsis Markers  Recent Labs Lab 04/24/14 0603  LATICACIDVEN 2.47*   ABG  Recent Labs Lab 04/25/14 0412 04/26/14 0455 04/26/14 0828  PHART 7.315* 7.248* 7.297*  PCO2ART 67.8* 80.3* 75.6*  PO2ART 99.3 116.0* 105.0*   Liver Enzymes  Recent Labs Lab 04/25/14 0621  AST 32  ALT 32  ALKPHOS 76  BILITOT <0.2*  ALBUMIN 3.5   Glucose  Recent Labs Lab 04/26/14 0802 04/26/14 1211 04/26/14 1645 04/26/14 1935 04/26/14 2336 04/27/14 0355  GLUCAP 160* 179* 160* 165* 172* 159*    Imaging  Mr Brain Wo Contrast  04/25/2014   CLINICAL DATA:  Altered mental status. Hypertensive hyperlipidemic patient. COPD.  EXAM: MRI HEAD WITHOUT CONTRAST  TECHNIQUE: Multiplanar, multiecho pulse sequences of the brain  and surrounding structures were obtained without intravenous contrast.  COMPARISON:  07/31/2012 CT.  No comparison MR.  FINDINGS: No acute infarct.  No intracranial hemorrhage.  No intracranial mass lesion noted on this unenhanced exam.  No hydrocephalus.  Mild exophthalmos.  Asymmetric sella with down sloping on the right similar to the prior CT.  Paranasal sinus mild mucosal thickening.  Partial opacification mastoid air cells bilaterally. Prominence of soft tissue in the posterior superior nasopharynx may represent adenoidal tissue contributing to eustachian tube dysfunction. Limited for evaluating for mucosal abnormality at this level. Patient is intubated.  Cervical medullary junction unremarkable.  Major intracranial vascular structures  are patent  IMPRESSION: No acute infarct.  No intracranial hemorrhage.  No intracranial mass lesion noted on this unenhanced exam.  Mild exophthalmos.  Asymmetric sella with down sloping on the right similar to the prior CT.  Paranasal sinus mild mucosal thickening.  Partial opacification mastoid air cells bilaterally.  Please see above.   Electronically Signed   By: Chauncey Cruel M.D.   On: 04/25/2014 18:48   Dg Chest Port 1 View  04/27/2014   CLINICAL DATA:  Assess endotracheal tube.  COPD.  EXAM: PORTABLE CHEST - 1 VIEW  COMPARISON:  04/26/2014  FINDINGS: Endotracheal tube ends at the level of the upper thoracic trachea, at the clavicular heads. There is an enteric tube which enters the stomach at least.  Chronic cardiomegaly. Pulmonary hyperinflation in this patient with history of COPD. Previously noted pulmonary edema has resolved. No evidence of pneumonia. No increasing pleural fluid. No pneumothorax. Ovoid sclerotic focus associated with the posterior left fifth rib, stable from 2013 at least and most consistent with bone island.  IMPRESSION: 1. Endotracheal and orogastric tubes remain in good position. 2. Clearing pulmonary edema.   Electronically Signed   By: Jorje Guild M.D.   On: 04/27/2014 06:21   Dg Chest Port 1 View  04/26/2014   CLINICAL DATA:  Hypoxia  EXAM: PORTABLE CHEST - 1 VIEW  COMPARISON:  Apr 25, 2014  FINDINGS: Endotracheal tube tip is 5.0 cm above the carina. Nasogastric tube tip and side port are below the diaphragm. No apparent pneumothorax.  There is underlying emphysematous change. Interstitium is mildly prominent ; there may be mild interstitial edema superimposed. The heart is mildly enlarged. The pulmonary vascularity is within normal limits. No adenopathy.  IMPRESSION: Tube positions as described without pneumothorax. Underlying emphysema. Suspect a degree of congestive heart failure superimposed on emphysematous change. There is no appreciable airspace consolidation.    Electronically Signed   By: Lowella Grip M.D.   On: 04/26/2014 07:07    ASSESSMENT / PLAN:  PULMONARY A: Acute on chronic respiratory failure 2nd to AECOPD, acute on chronic respiratory acidosis, moderate subglottic secretions, Improving cxr pulm edema, (gd I d-dysfxn) P:   Plan to extubate  Lasix 40 mg IV x1 Solumedrol 40 mg q12h Scheduled BD's PAD protocol (see neuro section re: sedation goal) F/u CXR   CARDIOVASCULAR A:  Hx of HTN with acute on chronic diastolic dysfx. P:  Hold hyzaar PRN IV hydralazine for SBP > 170 Monitor hemodynamics  RENAL A:  Fluid balance even this morning, hypernatremia at 148 P: Lasix 40 mg IV x1 KVO maintnance IV fluids Monitor renal fx and hypernatermia   GASTROINTESTINAL A:   Nutrition. Hx of GERD. No BM in 4 days P:   Tube feeds Protonix Ct Miralax Initiate Colace  HEMATOLOGIC A:   Leukocytosis-->Resolved P:  F/u CBC SQ heparin for DVT  prophylaxis  INFECTIOUS A:   AECOPD. P:   Levaquin - Stop date 6/05?  ENDOCRINE A:   Steroid induced hyperglycemia.   P:   SSI  NEUROLOGIC A:   Acute encephalopathy 2nd to respiratory failure. improved P: RASS goal: 0  Supportive care  Reviewed above, examined pt, and agree with assessment/plan. Extubated successfully this AM.  Will use BiPAP qhs and prn.  Give extra lasix today.  CC timd 35 minutes.  Chesley Mires, MD [facility] Pulmonary/Critical Care 04/27/2014, 10:43 AM Pager:  [phone] After 3pm call: [phone]

## 2014-04-28 LAB — BASIC METABOLIC PANEL
BUN: 31 mg/dL — ABNORMAL HIGH (ref 6–23)
CO2: 39 mEq/L — ABNORMAL HIGH (ref 19–32)
Calcium: 9.5 mg/dL (ref 8.4–10.5)
Chloride: 98 mEq/L (ref 96–112)
Creatinine, Ser: 0.83 mg/dL (ref 0.50–1.35)
GFR calc Af Amer: >90 mL/min (ref >90)
GFR calc non Af Amer: >90 mL/min (ref >90)
Glucose, Bld: 150 mg/dL — ABNORMAL HIGH (ref 70–99)
Potassium: 4.1 mEq/L (ref 3.7–5.3)
Sodium: 147 mEq/L (ref 137–147)

## 2014-04-28 LAB — CBC
HCT: 42.3 % (ref 39.0–52.0)
Hemoglobin: 12.8 g/dL — ABNORMAL LOW (ref 13.0–17.0)
MCH: 26.9 pg (ref 26.0–34.0)
MCHC: 30.3 g/dL (ref 30.0–36.0)
MCV: 89.1 fL (ref 78.0–100.0)
Platelets: 402 10*3/uL — ABNORMAL HIGH (ref 150–400)
RBC: 4.75 MIL/uL (ref 4.22–5.81)
RDW: 14.7 % (ref 11.5–15.5)
WBC: 16.6 10*3/uL — ABNORMAL HIGH (ref 4.0–10.5)

## 2014-04-28 LAB — BLOOD GAS, ARTERIAL
Acid-Base Excess: 15.3 mmol/L — ABNORMAL HIGH (ref 0.0–2.0)
Bicarbonate: 40.6 mEq/L — ABNORMAL HIGH (ref 20.0–24.0)
Delivery systems: POSITIVE
Drawn by: 252031
Expiratory PAP: 6
FIO2: 0.4 %
Inspiratory PAP: 12
O2 Saturation: 96.7 %
Patient temperature: 98.6
TCO2: 42.5 mmol/L (ref 0–100)
pCO2 arterial: 62.4 mmHg (ref 35.0–45.0)
pH, Arterial: 7.429 (ref 7.350–7.450)
pO2, Arterial: 88.9 mmHg (ref 80.0–100.0)

## 2014-04-28 LAB — GLUCOSE, CAPILLARY
Glucose-Capillary: 147 mg/dL — ABNORMAL HIGH (ref 70–99)
Glucose-Capillary: 155 mg/dL — ABNORMAL HIGH (ref 70–99)
Glucose-Capillary: 171 mg/dL — ABNORMAL HIGH (ref 70–99)
Glucose-Capillary: 172 mg/dL — ABNORMAL HIGH (ref 70–99)
Glucose-Capillary: 173 mg/dL — ABNORMAL HIGH (ref 70–99)

## 2014-04-28 MED ORDER — DOCUSATE SODIUM 100 MG PO CAPS
100.0000 mg | ORAL_CAPSULE | Freq: Every day | ORAL | Status: DC
Start: 2014-04-28 — End: 2014-05-05
  Administered 2014-04-28 – 2014-05-04 (×5): 100 mg via ORAL
  Filled 2014-04-28 (×7): qty 1

## 2014-04-28 MED ORDER — TRAZODONE HCL 50 MG PO TABS
50.0000 mg | ORAL_TABLET | Freq: Every evening | ORAL | Status: DC | PRN
  Administered 2014-04-30 – 2014-05-04 (×2): 50 mg via ORAL
  Filled 2014-04-28 (×2): qty 1

## 2014-04-28 MED ORDER — PREDNISONE 20 MG PO TABS
20.0000 mg | ORAL_TABLET | Freq: Every day | ORAL | Status: DC
Start: 2014-04-28 — End: 2014-05-05
  Administered 2014-04-28 – 2014-05-05 (×8): 20 mg via ORAL
  Filled 2014-04-28 (×10): qty 1

## 2014-04-28 MED ORDER — SODIUM CHLORIDE 0.9 % IV SOLN: INTRAVENOUS | Status: DC | PRN

## 2014-04-28 NOTE — Progress Notes (Signed)
RT note: RT took patient off BIPAP per patient request for breakfast/drink. Placed pt on NRB. Pt tol well at this time. HR 118, RR 18, sat 99%, no distress noted. Will cont to monitor

## 2014-04-28 NOTE — Progress Notes (Signed)
1726 Pt transferred to 2H16. Report given to Tressie Ellis, Therapist, sports. Pt VSS. No complaints of pain and / or signs of distress. All questions answered at bedside.

## 2014-04-28 NOTE — Evaluation (Signed)
Physical Therapy Evaluation Patient Details Name: Isaac [name] MRN: [id number] DOB: 10-Nov-1956 Today's Date: 04/28/2014   History of Present Illness  Pt admit with COPD exacerbation.  On Bipap.    Clinical Impression  Pt admitted with above. Pt currently with functional limitations due to the deficits listed below (see PT Problem List).  Pt will benefit from skilled PT to increase their independence and safety with mobility to allow discharge to the venue listed below.     Follow Up Recommendations Home health PT;Supervision/Assistance - 24 hour    Equipment Recommendations  Rolling walker with 5" wheels;3in1 (PT)    Recommendations for Other Services       Precautions / Restrictions Precautions Precautions: Fall Precaution Comments: Bipap in place entire eval Restrictions Weight Bearing Restrictions: No      Mobility  Bed Mobility Overal bed mobility: Needs Assistance;+2 for physical assistance Bed Mobility: Supine to Sit     Supine to sit: Mod assist;+2 for physical assistance     General bed mobility comments: Pt needed assist to move LEs off bed and for elevation of trunk.    Transfers Overall transfer level: Needs assistance Equipment used: 2 person hand held assist Transfers: Sit to/from [facility] Sit to Stand: Mod assist;+2 physical assistance Stand pivot transfers: Min assist;+2 physical assistance       General transfer comment: Pt needed assist to power up off bed.  Held onto therapists with bil UEs and performed stand pivot.  Slightly unsteady in standing needing steadying support.    Ambulation/Gait                Stairs            Wheelchair Mobility    Modified Rankin (Stroke Patients Only)       Balance Overall balance assessment: Needs assistance;History of Falls Sitting-balance support: No upper extremity supported;Feet supported Sitting balance-Leahy Scale: Fair     Standing balance support: Bilateral  upper extremity supported;During functional activity Standing balance-Leahy Scale: Poor Standing balance comment: Needs UE support.                               Pertinent Vitals/Pain VSS on Bipap, no pain    Home Living Family/patient expects to be discharged to:: Private residence Living Arrangements: Spouse/significant other Available Help at Discharge: Family;Available PRN/intermittently Type of Home: House Home Access: Stairs to enter Entrance Stairs-Rails: None Entrance Stairs-Number of Steps: 3 Home Layout: One level Home Equipment: Cane - single point;Shower seat      Prior Function Level of Independence: Independent               Hand Dominance   Dominant Hand: Right    Extremity/Trunk Assessment   Upper Extremity Assessment: Defer to OT evaluation           Lower Extremity Assessment: Generalized weakness         Communication   Communication: No difficulties  Cognition Arousal/Alertness: Awake/alert Behavior During Therapy: WFL for tasks assessed/performed Overall Cognitive Status: Within Functional Limits for tasks assessed                      General Comments      Exercises General Exercises - Lower Extremity Ankle Circles/Pumps: AROM;Both;10 reps;Seated Long Arc Quad: AROM;Both;10 reps;Seated      Assessment/Plan    PT Assessment Patient needs continued PT services  PT Diagnosis Generalized weakness  PT Problem List Decreased balance;Decreased activity tolerance;Decreased strength;Decreased mobility;Decreased knowledge of use of DME;Decreased safety awareness;Decreased knowledge of precautions  PT Treatment Interventions DME instruction;Gait training;Functional mobility training;Therapeutic activities;Therapeutic exercise;Balance training;Patient/family education   PT Goals (Current goals can be found in the Care Plan section) Acute Rehab PT Goals Patient Stated Goal: to go home PT Goal Formulation: With  patient Time For Goal Achievement: 05/05/14 Potential to Achieve Goals: Good    Frequency Min 3X/week   Barriers to discharge        Co-evaluation               End of Session Equipment Utilized During Treatment: Gait belt;Oxygen Activity Tolerance: Patient limited by fatigue Patient left: in chair;with call bell/phone within reach;with family/visitor present Nurse Communication: Mobility status         Time: 0911-0926 PT Time Calculation (min): 15 min   Charges:   PT Evaluation $Initial PT Evaluation Tier I: 1 Procedure PT Treatments $Therapeutic Activity: 8-22 mins   PT G Codes:          Christianne Dolin 05-11-14, 9:36 AM [facility] [phone] (pager)

## 2014-04-28 NOTE — Progress Notes (Signed)
CRITICAL VALUE ALERT  Critical value received:  CO2 62  Date of notification:  04/28/2014  Time of notification:  4:50 AM  Critical value read back:yes  Nurse who received alert:  Allegra Grana RN  MD notified (1st page):  Zubelevitsky MD  Time of first page:  0450  MD notified (2nd page):  Time of second page:  Responding MD:  Zubelevitsky   Time MD responded:  [phone]

## 2014-04-28 NOTE — Progress Notes (Signed)
NUTRITION FOLLOW UP  Intervention:    Continue CHO-modified diet to provide nutrition needs.  Add supplements as needed for poor oral intake.  Nutrition Dx:   Inadequate oral intake related to difficulty breathing as evidenced by minimal intake of meals.   Goal:   Intake to meet >90% of estimated nutrition needs.  Monitor:   PO intake, labs, weight trend, respiratory status.  Assessment:   Pt with severe COPD, had progressive dyspnea for 3-4 days and was intubated in ED for acute hypercapnic/hypoxic respiratory failure. Intubated on admission.  Extubated on 6/2, currently receiving BiPAP when needed. Diet advanced to CHO-modified yesterday. Per RN, patient is not eating well, likely related to difficulty breathing. Expect intake will improve once respiratory distress improves.  Height: Ht Readings from Last 1 Encounters:  04/24/14 5\' 11"  (1.803 m)    Weight Status:   Wt Readings from Last 1 Encounters:  04/28/14 272 lb 11.3 oz (123.7 kg)    Re-estimated needs:  Kcal: 2000-2200 Protein: 115-125 gm Fluid: 2-2.2 L  Skin: no wounds  Diet Order: Carb Control   Intake/Output Summary (Last 24 hours) at 04/28/14 1440 Last data filed at 04/28/14 1300  Gross per 24 hour  Intake    660 ml  Output   4000 ml  Net  -3340 ml    Last BM: 6/3   Labs:   Recent Labs Lab 04/25/14 0621 04/26/14 0310 04/27/14 0335 04/28/14 0225  NA 142 145 148* 147  K 4.2 4.8 4.6 4.1  CL 100 105 102 98  CO2 32 33* 38* 39*  BUN 21 29* 36* 31*  CREATININE 0.88 1.00 0.97 0.83  CALCIUM 8.6 8.4 8.9 9.5  MG 2.1  --   --   --   PHOS 3.2  --   --   --   GLUCOSE 158* 180* 183* 150*    CBG (last 3)   Recent Labs  04/27/14 2358 04/28/14 0826 04/28/14 1145  GLUCAP 173* 172* 171*    Scheduled Meds: . antiseptic oral rinse  15 mL Mouth Rinse QID  . arformoterol  15 mcg Nebulization Q12H  . budesonide (PULMICORT) nebulizer solution  0.25 mg Nebulization Q12H  . chlorhexidine  15 mL  Mouth Rinse BID  . cloNIDine  0.2 mg Oral TID  . docusate sodium  100 mg Oral Daily  . heparin subcutaneous  5,000 Units Subcutaneous 3 times per day  . losartan  50 mg Oral Daily   And  . hydrochlorothiazide  12.5 mg Oral Daily  . insulin aspart  0-20 Units Subcutaneous TID WC  . ipratropium  0.5 mg Nebulization Q6H  . pantoprazole  40 mg Oral Q1200  . polyethylene glycol  17 g Oral Daily  . predniSONE  20 mg Oral Q breakfast  . sertraline  25 mg Oral Daily  . tamsulosin  0.4 mg Oral Daily    Continuous Infusions: None   Molli Barrows, RD, LDN, [place] Pager [phone] After Hours Pager [phone]

## 2014-04-28 NOTE — Progress Notes (Signed)
PULMONARY / CRITICAL CARE MEDICINE   Name: Isaac [name] MRN: [id number] DOB: [birth year]    ADMISSION DATE:  04/24/2014  REFERRING MD :  Sharol Given  CHIEF COMPLAINT:  SOB  BRIEF PATIENT DESCRIPTION:  58 yo male former smoker with progressive dyspnea from AECOPD.  Intubated in ED.  Followed by Dr. Chase Caller.  SIGNIFICANT EVENTS: 5/30 Admit 6/01 continues to need ventilator support 6/02 Extubated, doing well on BiPAP 6/03 Transfer to SDU  STUDIES:  2D echo 5/30: EF 70%, abnormal left ventricular relaxation (grade 1 diastolic dysfunction). MRI Brain 5/31: Mild exophthalmos, paranasal sinus thickening, partial mastoid opacification b/l  LINES / TUBES: ETT 5/30>>>6/02  CULTURES: Sputum 5/30: negative Blood Cultures x2 5/30: >>>  ANTIBIOTICS: Levaquin 5/30 >>> Stop date 6/05?   SUBJECTIVE:  Doing well on BiPAP states he felt his breathing was better throughout the night, continues to have anxiety related to SOB during the morning.   VITAL SIGNS: Temp:  [98.4 F (36.9 C)-99.2 F (37.3 C)] 98.9 F (37.2 C) (06/03 0425) Pulse Rate:  [93-128] 105 (06/03 0700) Resp:  [13-30] 21 (06/03 0643) BP: (124-187)/(68-104) 145/91 mmHg (06/03 0600) SpO2:  [94 %-100 %] 97 % (06/03 0746) FiO2 (%):  [40 %-55 %] 40 % (06/03 0746) Weight:  [123.7 kg (272 lb 11.3 oz)] 123.7 kg (272 lb 11.3 oz) (06/03 0400) VENTILATOR SETTINGS: Vent Mode:  [-] CPAP FiO2 (%):  [40 %-55 %] 40 % PEEP:  [5 cmH20] 5 cmH20 Pressure Support:  [5 cmH20] 5 cmH20 INTAKE / OUTPUT: Intake/Output     06/02 0701 - 06/03 0700 06/03 0701 - 06/04 0700   P.O. 700    I.V. (mL/kg) 220 (1.8)    NG/GT 0    IV Piggyback 100    Total Intake(mL/kg) 1020 (8.2)    Urine (mL/kg/hr) 6750 (2.3)    Total Output 6750     Net -5730            PHYSICAL EXAMINATION: General: Well appearing caucasian male Neuro:  RASS 0 this am  HEENT:  Pupils reactive Cardiovascular:  RRR Lungs: Diminished throughout  Abdomen:  distended, non  tender Musculoskeletal: no lower extremity edema Skin:  No rashes  LABS:  CBC  Recent Labs Lab 04/26/14 0310 04/27/14 0335 04/28/14 0225  WBC 9.8 9.5 16.6*  HGB 10.5* 11.2* 12.8*  HCT 35.6* 38.2* 42.3  PLT 268 272 402*   BMET  Recent Labs Lab 04/26/14 0310 04/27/14 0335 04/28/14 0225  NA 145 148* 147  K 4.8 4.6 4.1  CL 105 102 98  CO2 33* 38* 39*  BUN 29* 36* 31*  CREATININE 1.00 0.97 0.83  GLUCOSE 180* 183* 150*   Electrolytes  Recent Labs Lab 04/25/14 0621 04/26/14 0310 04/27/14 0335 04/28/14 0225  CALCIUM 8.6 8.4 8.9 9.5  MG 2.1  --   --   --   PHOS 3.2  --   --   --    Sepsis Markers  Recent Labs Lab 04/24/14 0603  LATICACIDVEN 2.47*   ABG  Recent Labs Lab 04/26/14 0455 04/26/14 0828 04/28/14 0432  PHART 7.248* 7.297* 7.429  PCO2ART 80.3* 75.6* 62.4*  PO2ART 116.0* 105.0* 88.9   Liver Enzymes  Recent Labs Lab 04/25/14 0621  AST 32  ALT 32  ALKPHOS 76  BILITOT <0.2*  ALBUMIN 3.5   Glucose  Recent Labs Lab 04/27/14 0355 04/27/14 0804 04/27/14 1211 04/27/14 1523 04/27/14 2001 04/27/14 2358  GLUCAP 159* 204* 136* 138* 132* 173*    Imaging  Dg Chest Port 1 View  04/27/2014   CLINICAL DATA:  Assess endotracheal tube.  COPD.  EXAM: PORTABLE CHEST - 1 VIEW  COMPARISON:  04/26/2014  FINDINGS: Endotracheal tube ends at the level of the upper thoracic trachea, at the clavicular heads. There is an enteric tube which enters the stomach at least.  Chronic cardiomegaly. Pulmonary hyperinflation in this patient with history of COPD. Previously noted pulmonary edema has resolved. No evidence of pneumonia. No increasing pleural fluid. No pneumothorax. Ovoid sclerotic focus associated with the posterior left fifth rib, stable from 2013 at least and most consistent with bone island.  IMPRESSION: 1. Endotracheal and orogastric tubes remain in good position. 2. Clearing pulmonary edema.   Electronically Signed   By: Jorje Guild M.D.   On:  04/27/2014 06:21    ASSESSMENT / PLAN:  PULMONARY A: Acute on chronic respiratory failure 2nd to AECOPD, acute on chronic respiratory acidosis - improving,  (gd I d-dysfxn) P:   Ct BiPAP HS and Venti mask during the day D/c solumedrol and change to prednisone 20 mg daily on 6/03 >> wean off as tolerated over next one week Scheduled BD's Progress activity as tolerated Will need further sleep assessment as outpt   CARDIOVASCULAR A:  Hx of HTN with acute on chronic diastolic dysfx. P:  Ct. Cozaar, HCTZ, and clonidine PRN IV hydralazine for SBP > 170 Monitor hemodynamics  RENAL A:  Negative fluid balance even this morning, hypernatremia - improving P: D/c foley Increase PO intake KVO maintnance IV fluids Monitor renal fx and BMET  GASTROINTESTINAL A:   Nutrition. Hx of GERD. Constipation >> had BM 6/03. P:   Tube feeds Protonix Ct Miralax Ct Colace  HEMATOLOGIC A:   Leukocytosis--> small increase this AM P:  F/u CBC SQ heparin for DVT prophylaxis  INFECTIOUS A:   AECOPD. P:   Levaquin - Stop date 6/05  ENDOCRINE A:   Steroid induced hyperglycemia P:    SSI  NEUROLOGIC A:   Acute encephalopathy 2nd to respiratory failure - resolved, anxiety related to SOB, chronic insomnia  P: Ct. Xanax for anxiety PT/OT ordered OOB to chair today RASS goal: 0  Supportive care Trazodone prn for sleep  Isaac [name] is looking much better this AM. Planning to get him OOB to chair this morning and will order sleep aid for his insomnia. Getting close to being ready for SDU.   Reviewed above, examined pt, and agree with assessment/plan.  Much improved.  Did well with BiPAP overnight.  Will transfer to SDU.  Keep on PCCM service.  Isaac Mires, MD [facility] Pulmonary/Critical Care 04/28/2014, 8:01 AM Pager:  [phone] After 3pm call: [phone]

## 2014-04-28 NOTE — Progress Notes (Signed)
RT Note: Pt attempted to take pt off bipap. Pt immediately stated he could not breathe off bipap & wanted to be put back on. Pt diaphoretic & labored, pt tolerating well at this time. RT to monitor.

## 2014-04-29 LAB — CBC
HCT: 45.2 % (ref 39.0–52.0)
Hemoglobin: 13.9 g/dL (ref 13.0–17.0)
MCH: 27.1 pg (ref 26.0–34.0)
MCHC: 30.8 g/dL (ref 30.0–36.0)
MCV: 88.3 fL (ref 78.0–100.0)
Platelets: 506 10*3/uL — ABNORMAL HIGH (ref 150–400)
RBC: 5.12 MIL/uL (ref 4.22–5.81)
RDW: 15.3 % (ref 11.5–15.5)
WBC: 19.5 10*3/uL — ABNORMAL HIGH (ref 4.0–10.5)

## 2014-04-29 LAB — MAGNESIUM: Magnesium: 2.7 mg/dL — ABNORMAL HIGH (ref 1.5–2.5)

## 2014-04-29 LAB — COMPREHENSIVE METABOLIC PANEL
ALT: 70 U/L — ABNORMAL HIGH (ref 0–53)
AST: 54 U/L — ABNORMAL HIGH (ref 0–37)
Albumin: 3.7 g/dL (ref 3.5–5.2)
Alkaline Phosphatase: 58 U/L (ref 39–117)
BUN: 38 mg/dL — ABNORMAL HIGH (ref 6–23)
CO2: 35 mEq/L — ABNORMAL HIGH (ref 19–32)
Calcium: 9.6 mg/dL (ref 8.4–10.5)
Chloride: 97 mEq/L (ref 96–112)
Creatinine, Ser: 0.88 mg/dL (ref 0.50–1.35)
GFR calc Af Amer: >90 mL/min (ref >90)
GFR calc non Af Amer: >90 mL/min (ref >90)
Glucose, Bld: 122 mg/dL — ABNORMAL HIGH (ref 70–99)
Potassium: 5.1 mEq/L (ref 3.7–5.3)
Sodium: 146 mEq/L (ref 137–147)
Total Bilirubin: 0.7 mg/dL (ref 0.3–1.2)
Total Protein: 7 g/dL (ref 6.0–8.3)

## 2014-04-29 LAB — GLUCOSE, CAPILLARY
Glucose-Capillary: 101 mg/dL — ABNORMAL HIGH (ref 70–99)
Glucose-Capillary: 112 mg/dL — ABNORMAL HIGH (ref 70–99)
Glucose-Capillary: 140 mg/dL — ABNORMAL HIGH (ref 70–99)
Glucose-Capillary: 123 mg/dL — ABNORMAL HIGH (ref 70–99)

## 2014-04-29 MED ORDER — OXYMETAZOLINE HCL 0.05 % NA SOLN
2.0000 | Freq: Two times a day (BID) | NASAL | Status: DC
  Administered 2014-04-29 – 2014-05-02 (×8): 2 via NASAL
  Filled 2014-04-29: qty 15

## 2014-04-29 MED ORDER — BISOPROLOL FUMARATE 5 MG PO TABS
5.0000 mg | ORAL_TABLET | Freq: Every day | ORAL | Status: DC
  Administered 2014-04-29 – 2014-05-05 (×7): 5 mg via ORAL
  Filled 2014-04-29 (×7): qty 1

## 2014-04-29 MED ORDER — ALBUTEROL SULFATE (2.5 MG/3ML) 0.083% IN NEBU
2.5000 mg | INHALATION_SOLUTION | Freq: Four times a day (QID) | RESPIRATORY_TRACT | Status: DC
  Administered 2014-04-29 – 2014-05-05 (×23): 2.5 mg via RESPIRATORY_TRACT
  Filled 2014-04-29 (×23): qty 3

## 2014-04-29 MED ORDER — FLUTICASONE PROPIONATE 50 MCG/ACT NA SUSP
2.0000 | Freq: Every day | NASAL | Status: DC
  Administered 2014-04-29 – 2014-05-05 (×7): 2 via NASAL
  Filled 2014-04-29: qty 16

## 2014-04-29 MED ORDER — LEVOFLOXACIN IN D5W 500 MG/100ML IV SOLN
500.0000 mg | Freq: Every day | INTRAVENOUS | Status: AC
  Administered 2014-04-29 – 2014-04-30 (×2): 500 mg via INTRAVENOUS
  Filled 2014-04-29 (×2): qty 100

## 2014-04-29 NOTE — Progress Notes (Signed)
OT Cancellation Note  Patient Details Name: Isaac [name] MRN: [id number] DOB: 10/22/1956   Cancelled Treatment:    Reason Eval/Treat Not Completed: Patient declined, no reason specified. Pt declines Ot after PT session and respiratory treatment. Information obtained and Ot plans to attempt evaluation on 04/30/2014   Peri Maris Pager: [phone]  04/29/2014, 3:53 PM

## 2014-04-29 NOTE — Progress Notes (Signed)
PULMONARY / CRITICAL CARE MEDICINE   Name: Isaac [name] MRN: [id number] DOB: January 06, 1956    ADMISSION DATE:  04/24/2014  REFERRING MD :  Sharol Given  CHIEF COMPLAINT:  SOB  BRIEF PATIENT DESCRIPTION:  58 yo male former smoker with progressive dyspnea from AECOPD.  Intubated in ED. Extubated on 6/02 and doing well with BiPAP HS and venti mask during the day. Transferred to SDU 6/3. On 6/4 he continues to do well with venti mask during the day and hemodynamics remaining stable. Followed by Dr. Chase Caller.  SIGNIFICANT EVENTS: 5/30 Admit 6/01 continues to need ventilator support 6/02 Extubated, doing well on BiPAP 6/03 Transfered to SDU 6/04 Doing well on venti mask  STUDIES:  2D echo 5/30: EF 70%, abnormal left ventricular relaxation (grade 1 diastolic dysfunction). MRI Brain 5/31: Mild exophthalmos, paranasal sinus thickening, partial mastoid opacification b/l  LINES / TUBES: ETT 5/30>>>6/02  CULTURES: Sputum 5/30: negative Blood Cultures x2 5/30: >>>  ANTIBIOTICS: Levaquin 5/30 >>> Stop date 6/05   SUBJECTIVE:  Doing well in the chair on venti mask this AM. Anxiety of breathing seems to be improved. Was able to sleep a few hours during the night, will advise RN to give Trazodone at HS to promote sleep tonight.  VITAL SIGNS: Temp:  [98.2 F (36.8 C)-99 F (37.2 C)] 98.2 F (36.8 C) (06/04 0750) Pulse Rate:  [102-131] 102 (06/04 0750) Resp:  [9-28] 16 (06/04 0800) BP: (99-148)/(52-102) 129/82 mmHg (06/04 0800) SpO2:  [90 %-100 %] 100 % (06/04 1013) FiO2 (%):  [40 %-100 %] 40 % (06/04 1013) Weight:  [124.7 kg (274 lb 14.6 oz)] 124.7 kg (274 lb 14.6 oz) (06/04 0500) VENTILATOR SETTINGS: Vent Mode:  [-]  FiO2 (%):  [40 %-100 %] 40 % INTAKE / OUTPUT: Intake/Output     06/03 0701 - 06/04 0700 06/04 0701 - 06/05 0700   P.O. 120 120   I.V. (mL/kg) 50 (0.4)    IV Piggyback     Total Intake(mL/kg) 170 (1.4) 120 (1)   Urine (mL/kg/hr) 1200 (0.4)    Total Output 1200     Net -1030 +120        Stool Occurrence 1 x      PHYSICAL EXAMINATION: General: Well appearing caucasian male, sitting in the chair Neuro:  RASS 0 this am  HEENT:  Pupils reactive Cardiovascular: sinus tachycardia Lungs: LUL wheezing, diminished throughout  Abdomen:  distended, non tender  Musculoskeletal: mild 1+ lower extremity edema Skin:  No rashes  LABS:  CBC  Recent Labs Lab 04/27/14 0335 04/28/14 0225 04/29/14 0300  WBC 9.5 16.6* 19.5*  HGB 11.2* 12.8* 13.9  HCT 38.2* 42.3 45.2  PLT 272 402* 506*   BMET  Recent Labs Lab 04/27/14 0335 04/28/14 0225 04/29/14 0300  NA 148* 147 146  K 4.6 4.1 5.1  CL 102 98 97  CO2 38* 39* 35*  BUN 36* 31* 38*  CREATININE 0.97 0.83 0.88  GLUCOSE 183* 150* 122*   Electrolytes  Recent Labs Lab 04/25/14 0621  04/27/14 0335 04/28/14 0225 04/29/14 0300  CALCIUM 8.6  < > 8.9 9.5 9.6  MG 2.1  --   --   --  2.7*  PHOS 3.2  --   --   --   --   < > = values in this interval not displayed. Sepsis Markers  Recent Labs Lab 04/24/14 0603  LATICACIDVEN 2.47*   ABG  Recent Labs Lab 04/26/14 0455 04/26/14 0828 04/28/14 0432  PHART 7.248* 7.297*  7.429  PCO2ART 80.3* 75.6* 62.4*  PO2ART 116.0* 105.0* 88.9   Liver Enzymes  Recent Labs Lab 04/25/14 0621 04/29/14 0300  AST 32 54*  ALT 32 70*  ALKPHOS 76 58  BILITOT <0.2* 0.7  ALBUMIN 3.5 3.7   Glucose  Recent Labs Lab 04/27/14 2358 04/28/14 0826 04/28/14 1145 04/28/14 1712 04/28/14 2115 04/29/14 0754  GLUCAP 173* 172* 171* 155* 147* 101*    Imaging  No results found.  ASSESSMENT / PLAN:  PULMONARY A: Acute on chronic respiratory failure 2nd to AECOPD, acute on chronic respiratory acidosis - improving,  (gd I d-dysfxn) P:   Ct BiPAP HS and Venti mask during the day D/c solumedrol and change to prednisone 20 mg daily on 6/03 >> wean off as tolerated over next one week Scheduled BD's Progress activity as tolerated Will need further sleep  assessment as outpt, with empiric bipap nocturnal Will assess abg at 5 am on nimv Allow neg balance further on own   CARDIOVASCULAR A:  Hx of HTN with acute on chronic diastolic dysfx Sinus tachycardia P:  Consider a rate control medication? Perhaps start bisprolol instead of cozaar?  Ct. Cozaar, HCTZ, and clonidine PRN IV hydralazine for SBP > 170, avoid with tachy Monitor hemodynamics  RENAL A:  Negative fluid balance -> closer to net even this am P: Increase PO intake KVO maintnance IV fluids Monitor renal fx and BMET Neg balance goals remain on own  GASTROINTESTINAL A:   Nutrition. Hx of GERD. Constipation >>> had BM 6/03. P:   Protonix Ct Miralax Ct Colace  HEMATOLOGIC A:   Leukocytosis --> worsening P:  F/u CBC SQ heparin for DVT prophylaxis  INFECTIOUS A:   AECOPD. P:   Levaquin - Stop date 6/05  ENDOCRINE A:   Steroid induced hyperglycemia P:    SSI  NEUROLOGIC A:   Acute encephalopathy 2nd to respiratory failure - resolved anxiety related to SOB - improving chronic insomnia  P: Ct. Xanax for anxiety PT/OT ordered OOB to chair today Supportive care Trazodone prn for sleep  Mr. Eifler continues to improve this AM. He has some wheezing noted in the LUL and will advise respiratory therpay to give PRN dose albuterol. He has been in the chair for 6 hours this morning and doing well on the venti mask. Will encourage RN to use trazodone for insomnia this PM.  Reviewed above, examined pt, and agree with assessment/plan. Consider to floor   Much improved.  Did well with BiPAP overnight. Keep on PCCM service. Lavon Paganini. Titus Mould, MD, [place] Pgr: [facility]

## 2014-04-29 NOTE — Progress Notes (Signed)
Physical Therapy Treatment Patient Details Name: Isaac [name] MRN: [id number] DOB: [birth year] Today's Date: 04/29/2014    History of Present Illness Pt admit with COPD exacerbation.  On Bipap.      PT Comments    Pt progressing towards physical therapy goals. Was able to ambulate 15' in room with RW. Pt fatigues quickly and chair follow was required. Reviewed 3 LE exercises to practice between therapy sessions to improve strength and endurance. Will continue to follow.   Follow Up Recommendations  Home health PT;Supervision/Assistance - 24 hour     Equipment Recommendations  Rolling walker with 5" wheels;3in1 (PT)    Recommendations for Other Services       Precautions / Restrictions Precautions Precautions: Fall Restrictions Weight Bearing Restrictions: No    Mobility  Bed Mobility               General bed mobility comments: Pt sitting in recliner upon PT arrival  Transfers Overall transfer level: Needs assistance Equipment used: Rolling walker (2 wheeled) Transfers: Sit to/from Stand Sit to Stand: Min guard         General transfer comment: Pt able to power-up to full standing with min guard assist. VC's for hand placement on seated surface for safety and for controlled descent to chair.  Ambulation/Gait Ambulation/Gait assistance: Min guard Ambulation Distance (Feet): 15 Feet Assistive device: Rolling walker (2 wheeled) Gait Pattern/deviations: Step-through pattern;Decreased stride length;Trunk flexed Gait velocity: Decreased Gait velocity interpretation: Below normal speed for age/gender General Gait Details: VC's for pursed-lip breathing throughout gait training. Pt fatigues quickly.    Stairs            Wheelchair Mobility    Modified Rankin (Stroke Patients Only)       Balance Overall balance assessment: Needs assistance;History of Falls Sitting-balance support: Feet supported;No upper extremity supported Sitting balance-Leahy  Scale: Fair     Standing balance support: Bilateral upper extremity supported;During functional activity Standing balance-Leahy Scale: Poor                      Cognition Arousal/Alertness: Awake/alert Behavior During Therapy: WFL for tasks assessed/performed Overall Cognitive Status: Within Functional Limits for tasks assessed                      Exercises General Exercises - Lower Extremity Ankle Circles/Pumps: 20 reps Long Arc Quad: 15 reps Hip ABduction/ADduction: 15 reps    General Comments        Pertinent Vitals/Pain HR increased to 130bpm during ambulation. O2 sats remained >90%.     Home Living                      Prior Function            PT Goals (current goals can now be found in the care plan section) Acute Rehab PT Goals Patient Stated Goal: to go home PT Goal Formulation: With patient Time For Goal Achievement: 05/05/14 Potential to Achieve Goals: Good Progress towards PT goals: Progressing toward goals    Frequency  Min 3X/week    PT Plan Current plan remains appropriate    Co-evaluation             End of Session Equipment Utilized During Treatment: Gait belt;Oxygen Activity Tolerance: Patient limited by fatigue Patient left: in chair;with call bell/phone within reach     Time: 3220-2542 PT Time Calculation (min): 25 min  Charges:  $Gait Training: 8-22  mins $Therapeutic Exercise: 8-22 mins                    G Codes:      Jolyn Lent May 29, 2014, 3:42 PM  Jolyn Lent, PT, DPT Acute Rehabilitation Services Pager: [phone]

## 2014-04-30 DIAGNOSIS — J9600 Acute respiratory failure, unspecified whether with hypoxia or hypercapnia: Secondary | ICD-10-CM

## 2014-04-30 DIAGNOSIS — J449 Chronic obstructive pulmonary disease, unspecified: Secondary | ICD-10-CM

## 2014-04-30 LAB — GLUCOSE, CAPILLARY
Glucose-Capillary: 93 mg/dL (ref 70–99)
Glucose-Capillary: 152 mg/dL — ABNORMAL HIGH (ref 70–99)
Glucose-Capillary: 129 mg/dL — ABNORMAL HIGH (ref 70–99)
Glucose-Capillary: 110 mg/dL — ABNORMAL HIGH (ref 70–99)

## 2014-04-30 LAB — BLOOD GAS, ARTERIAL
Acid-Base Excess: 13.3 mmol/L — ABNORMAL HIGH (ref 0.0–2.0)
Bicarbonate: 38.8 mEq/L — ABNORMAL HIGH (ref 20.0–24.0)
Drawn by: 10006
O2 Content: 5 L/min
O2 Saturation: 97.1 %
Patient temperature: 98.6
TCO2: 40.8 mmol/L (ref 0–100)
pCO2 arterial: 64.6 mmHg (ref 35.0–45.0)
pH, Arterial: 7.396 (ref 7.350–7.450)
pO2, Arterial: 98.3 mmHg (ref 80.0–100.0)

## 2014-04-30 LAB — BASIC METABOLIC PANEL
BUN: 36 mg/dL — ABNORMAL HIGH (ref 6–23)
CO2: 38 mEq/L — ABNORMAL HIGH (ref 19–32)
Calcium: 9 mg/dL (ref 8.4–10.5)
Chloride: 97 mEq/L (ref 96–112)
Creatinine, Ser: 1.03 mg/dL (ref 0.50–1.35)
GFR calc Af Amer: >90 mL/min (ref >90)
GFR calc non Af Amer: 78 mL/min — ABNORMAL LOW (ref >90)
Glucose, Bld: 102 mg/dL — ABNORMAL HIGH (ref 70–99)
Potassium: 3.5 mEq/L — ABNORMAL LOW (ref 3.7–5.3)
Sodium: 142 mEq/L (ref 137–147)

## 2014-04-30 LAB — CULTURE, BLOOD (ROUTINE X 2)
Culture: NO GROWTH
Culture: NO GROWTH

## 2014-04-30 NOTE — Evaluation (Signed)
Occupational Therapy Evaluation Patient Details Name: DEWITT JUDICE MRN: [id number] DOB: 06-Dec-1955 Today's Date: 04/30/2014    History of Present Illness Pt admit with COPD exacerbation.  On Bipap.     Clinical Impression   PT admitted with COPD exacerbation. Pt currently with functional limitiations due to the deficits listed below (see OT problem list).  Pt will benefit from skilled OT to increase their independence and safety with adls and balance to allow discharge [place].     Follow Up Recommendations  Home health OT    Equipment Recommendations  3 in 1 bedside comode    Recommendations for Other Services       Precautions / Restrictions Precautions Precautions: Fall Restrictions Weight Bearing Restrictions: No      Mobility Bed Mobility               General bed mobility comments: in chair on arrival  Transfers Overall transfer level: Needs assistance Equipment used: Rolling walker (2 wheeled) Transfers: Sit to/from Stand Sit to Stand: Min guard              Balance Overall balance assessment: Needs assistance         Standing balance support: Bilateral upper extremity supported Standing balance-Leahy Scale: Fair                              ADL Overall ADL's : Needs assistance/impaired     Grooming: Wash/dry hands;Supervision/safety;Standing Grooming Details (indicate cue type and reason): cues for upright posture and (A) to reach soap dispenser                 Toilet Transfer: Minimal assistance;Grab Haematologist;Ambulation Toilet Transfer Details (indicate cue type and reason): pt voiding bowels this session Toileting- Clothing Manipulation and Hygiene: Supervision/safety;Sit to/from stand Toileting - Clothing Manipulation Details (indicate cue type and reason): lateral lean for peri care     Functional mobility during ADLs: Moderate assistance;Rolling walker General ADL Comments: Pt in chair and  agreeable to toilet transfer. pt needed (A) iwth RW due to leaning on the Rt side of RW. pt throughout session lifting wheels to the left side of the RW. Pt educated on the risk for falls and all 4 corners of RW are to be on the ground at all times.Pt drifts to the right with RW.     Vision                     Perception     Praxis      Pertinent Vitals/Pain VSS Requires 6 L oxygen during session saturation > 90%     Hand Dominance Right   Extremity/Trunk Assessment Upper Extremity Assessment Upper Extremity Assessment: Generalized weakness   Lower Extremity Assessment Lower Extremity Assessment: Defer to PT evaluation   Cervical / Trunk Assessment Cervical / Trunk Assessment: Normal   Communication Communication Communication: No difficulties   Cognition Arousal/Alertness: Awake/alert Behavior During Therapy: WFL for tasks assessed/performed Overall Cognitive Status: Within Functional Limits for tasks assessed                     General Comments       Exercises       Shoulder Instructions      Home Living Family/patient expects to be discharged to:: Private residence Living Arrangements: Spouse/significant other Available Help at Discharge: Family;Available PRN/intermittently Type of Home: House Home Access: Stairs to enter Entrance  Stairs-Number of Steps: 3 Entrance Stairs-Rails: None Home Layout: One level     Bathroom Shower/Tub: Tub/shower unit;Door   [employer] Toilet: Standard     Home Equipment: Kasandra Knudsen - single point;Shower seat;Hand held shower head   Additional Comments: pt hangs oxygen on the outside of bathroom door and sits on seat in tub to bath      Prior Functioning/Environment Level of Independence: Independent        Comments: wife comes home daily at lunch to help with meal prep. pt is at home from 8am-5pm daily except for lunch break.     OT Diagnosis: Generalized weakness   OT Problem List: Decreased  strength;Decreased activity tolerance;Impaired balance (sitting and/or standing);Decreased safety awareness;Decreased knowledge of use of DME or AE;Decreased knowledge of precautions;Obesity   OT Treatment/Interventions: Self-care/ADL training;Therapeutic exercise;DME and/or AE instruction;Therapeutic activities;Patient/family education;Balance training    OT Goals(Current goals can be found in the care plan section) Acute Rehab OT Goals Patient Stated Goal: to go home OT Goal Formulation: With patient Time For Goal Achievement: 05/14/14 Potential to Achieve Goals: Good  OT Frequency: Min 2X/week   Barriers to D/C:            Co-evaluation              End of Session Nurse Communication: Mobility status;Precautions  Activity Tolerance: Patient tolerated treatment well Patient left: in chair;with call bell/phone within reach   Time: 0927-0955 OT Time Calculation (min): 28 min Charges:  OT General Charges $OT Visit: 1 Procedure OT Evaluation $Initial OT Evaluation Tier I: 1 Procedure OT Treatments $Self Care/Home Management : 23-37 mins G-Codes:    Peri Maris 2014-05-22, 11:00 AM Pager: [phone]

## 2014-04-30 NOTE — Progress Notes (Signed)
Patient requests to not wear BiPap at this time due to throat soreness, feels BiPap will exacerbate this.

## 2014-04-30 NOTE — Progress Notes (Signed)
Patient ambulated wearing 6Lo2 starting from the bathroom to the door of the room. Patient o2 sats remained 99% but patient became anxious stating "I can't breathe" and "I need to sit down". Patient assisted to the chair and emotional support given. Resting now with sats 100% on 6LNC. Dr Titus Mould made aware.

## 2014-04-30 NOTE — Progress Notes (Signed)
PULMONARY / CRITICAL CARE MEDICINE   Name: Isaac [name] MRN: [id number] DOB: Aug 11, 1956    ADMISSION DATE:  04/24/2014  REFERRING MD :  Sharol Given  CHIEF COMPLAINT:  SOB  BRIEF PATIENT DESCRIPTION:  58 yo male former smoker with progressive dyspnea from AECOPD.  Intubated in ED. Extubated on 6/02 and doing well with BiPAP HS and venti mask during the day. Transferred to SDU 6/3. On 6/4 he continues to do well with venti mask during the day and hemodynamics remaining stable. Followed by Dr. Chase Caller.  SIGNIFICANT EVENTS: 5/30 Admit 6/01 continues to need ventilator support 6/02 Extubated, doing well on BiPAP 6/03 Transfered to SDU 6/04 Doing well on venti mask  STUDIES:  2D echo 5/30: EF 70%, abnormal left ventricular relaxation (grade 1 diastolic dysfunction). MRI Brain 5/31: Mild exophthalmos, paranasal sinus thickening, partial mastoid opacification b/l  LINES / TUBES: ETT 5/30>>>6/02  CULTURES: Sputum 5/30: negative Blood Cultures x2 5/30: >>>  ANTIBIOTICS: Levaquin 5/30 >>> Stop date 6/05   SUBJECTIVE:  No complaints  VITAL SIGNS: Temp:  [98.3 F (36.8 C)-98.8 F (37.1 C)] 98.4 F (36.9 C) (06/05 1145) Pulse Rate:  [77-100] 77 (06/05 1145) Resp:  [18-21] 20 (06/05 1145) BP: (79-119)/(44-83) 100/83 mmHg (06/05 1145) SpO2:  [94 %-100 %] 96 % (06/05 1145) FiO2 (%):  [40 %] 40 % (06/05 1034) VENTILATOR SETTINGS: Vent Mode:  [-]  FiO2 (%):  [40 %] 40 % INTAKE / OUTPUT: Intake/Output     06/04 0701 - 06/05 0700 06/05 0701 - 06/06 0700   P.O. 360 360   I.V. (mL/kg)     IV Piggyback 100 100   Total Intake(mL/kg) 460 (3.7) 460 (3.7)   Urine (mL/kg/hr) 850 (0.3)    Total Output 850     Net -390 +460        Urine Occurrence 2 x    Stool Occurrence 2 x      PHYSICAL EXAMINATION: General: sitting in the chair, no distress Neuro:  Awake, nonfocal HEENT:  Pupils reactive Cardiovascular: sinus tachycardia resolved rr Lungs: resolving mild wheezing Abdomen:   distended, obese, non tender  Musculoskeletal: mild 1+ lower extremity edema Skin:  No rashes  LABS:  CBC  Recent Labs Lab 04/27/14 0335 04/28/14 0225 04/29/14 0300  WBC 9.5 16.6* 19.5*  HGB 11.2* 12.8* 13.9  HCT 38.2* 42.3 45.2  PLT 272 402* 506*   BMET  Recent Labs Lab 04/28/14 0225 04/29/14 0300 04/30/14 0448  NA 147 146 142  K 4.1 5.1 3.5*  CL 98 97 97  CO2 39* 35* 38*  BUN 31* 38* 36*  CREATININE 0.83 0.88 1.03  GLUCOSE 150* 122* 102*   Electrolytes  Recent Labs Lab 04/25/14 0621  04/28/14 0225 04/29/14 0300 04/30/14 0448  CALCIUM 8.6  < > 9.5 9.6 9.0  MG 2.1  --   --  2.7*  --   PHOS 3.2  --   --   --   --   < > = values in this interval not displayed. Sepsis Markers  Recent Labs Lab 04/24/14 0603  LATICACIDVEN 2.47*   ABG  Recent Labs Lab 04/26/14 0828 04/28/14 0432 04/30/14 0555  PHART 7.297* 7.429 7.396  PCO2ART 75.6* 62.4* 64.6*  PO2ART 105.0* 88.9 98.3   Liver Enzymes  Recent Labs Lab 04/25/14 0621 04/29/14 0300  AST 32 54*  ALT 32 70*  ALKPHOS 76 58  BILITOT <0.2* 0.7  ALBUMIN 3.5 3.7   Glucose  Recent Labs Lab 04/29/14 0754  04/29/14 1149 04/29/14 1650 04/29/14 2124 04/30/14 0739 04/30/14 1148  GLUCAP 101* 112* 140* 123* 93 152*    Imaging  No results found.  ASSESSMENT / PLAN:  PULMONARY A:  R/o OHS, oSA Acute on chronic respiratory failure 2nd to AECOPD, acute on chronic respiratory acidosis - improving,  (gd I d-dysfxn) P:   Daytime [place] sucessfull prednisone 20 mg wean off as tolerated over next one week, reduce in am  Scheduled BD's Progress activity as tolerated Will need further sleep assessment as outpt, with empiric bipap nocturnal Used BIPAP last night oly few hours, may need to talk to R tto reduce pressures for compliance ABG at 5 am wnl, was this on BIPAP?, will d/w RT Ambulatory pulse ox if to RA   CARDIOVASCULAR A:  Hx of HTN with acute on chronic diastolic dysfx Sinus  tachycardia P:  Improved HR / BP with new agent started - bisprolol Cozaar, HCTZ, and clonidine Dc tele  RENAL A:  Negative fluid balance -> closer to net even this am P: kvo  GASTROINTESTINAL A:   Nutrition. Hx of GERD. Constipation >>> had BM 6/03. P:   Protonix Ct Miralax Ct Colace May need cathartic if no BM in am   HEMATOLOGIC A:   Leukocytosis --> worsening P:  F/u CBC for wbc in am  SQ heparin for DVT prophylaxis  INFECTIOUS A:   AECOPD Sinusitis? P:   Levaquin - Stop date 6/05, allow to dc  ENDOCRINE A:   Steroid induced hyperglycemia P:    SSI  NEUROLOGIC A:   Acute encephalopathy 2nd to respiratory failure - resolved anxiety related to SOB - improving chronic insomnia  P: Ct. Xanax for anxiety PT/OT ordered OOB to chair today ambulate Trazodone prn for sleep  To floor awaited Did well with bisprolol Lower needs BIPAP  Lavon Paganini. Titus Mould, MD, [place] Pgr: [facility]

## 2014-04-30 NOTE — Care Management Note (Addendum)
    Page 1 of 2   05/04/2014     11:02:39 AM CARE MANAGEMENT NOTE 05/04/2014  Patient:  Isaac [name], Isaac [name]   Account Number:  [id number]  Date Initiated:  04/26/2014  Documentation initiated by:  [facility]  Subjective/Objective Assessment:   Copd exacerbation - requiring intubation     Action/Plan:   Anticipated DC Date:  05/04/2014   Anticipated DC Plan:  [place]  CM consult      [facility] Choice  [place]   Choice offered to / List presented to:  C-1 Patient   DME arranged  [place]      DME agency  [place] arranged  HH-2 PT  HH-3 OT  HH-1 RN  [place].   Status of service:  Completed, signed off Medicare Important Message given?   (If response is "NO", the following Medicare IM given date fields will be blank) Date Medicare IM given:   Date Additional Medicare IM given:    Discharge Disposition:  [place]  Per UR Regulation:  Reviewed for med. necessity/level of care/duration of stay  If discussed at [place] of Stay Meetings, dates discussed:   05/04/2014    Comments:  Contact:  Swinger,Germaine Spouse [phone]                 Guion,Emily Daughter     [phone] 6/8 1020a debbie dowell rn,bsn pt for dc home in am. dr z states will need home bipap. have spoken w jermaine at [facility] about prot o2 sytem being more mobile-new bipap and rolling walker. will alert [facility] of dc for am for hhpt/ot.   6/5 1207p debbie dowell rn,bsn spoke w pt and wife. they would like to have hhc if md orders. they get o2 w [facility]c and would like ahc to eval for more port system as system has now retstricts pt getting out when he feels better. he has tub chr and does not think 3n1 bsc will fit in his small toilet. he would like to have walker. have left sticky note. have spoken w donna  w ahc to get ahc to ck home o2 system. pt will need walker and pt/ot. await final orders.

## 2014-05-01 DIAGNOSIS — J329 Chronic sinusitis, unspecified: Secondary | ICD-10-CM

## 2014-05-01 DIAGNOSIS — E669 Obesity, unspecified: Secondary | ICD-10-CM

## 2014-05-01 DIAGNOSIS — G4730 Sleep apnea, unspecified: Secondary | ICD-10-CM

## 2014-05-01 LAB — GLUCOSE, CAPILLARY
Glucose-Capillary: 100 mg/dL — ABNORMAL HIGH (ref 70–99)
Glucose-Capillary: 110 mg/dL — ABNORMAL HIGH (ref 70–99)
Glucose-Capillary: 168 mg/dL — ABNORMAL HIGH (ref 70–99)
Glucose-Capillary: 112 mg/dL — ABNORMAL HIGH (ref 70–99)

## 2014-05-01 MED ORDER — SALINE SPRAY 0.65 % NA SOLN
1.0000 | NASAL | Status: DC | PRN
  Filled 2014-05-01: qty 44

## 2014-05-01 NOTE — Progress Notes (Signed)
PULMONARY / CRITICAL CARE MEDICINE   Name: Isaac [name] MRN: [id number] DOB: 10/12/1956    ADMISSION DATE:  04/24/2014  REFERRING MD :  Sharol Given  CHIEF COMPLAINT:  SOB  BRIEF PATIENT DESCRIPTION: 58 yo smoker with progressive dyspnea from AECOPD.  Intubated in ED. Extubated on 6/02 and doing well with BiPAP HS and venti mask during the day. Transferred to SDU 6/3. On 6/4 he continues to do well with venti mask during the day and hemodynamics remaining stable. Followed by Dr. Chase Caller.  SIGNIFICANT EVENTS / STUDIES: 5/30  Admited 5/30  TTE >>> EF 25%, grade 1 diastolic dysfunction 3/66  MRI brain >>> Mild exophthalmos, paranasal sinus thickening, partial mastoid opacification b/l 6/02  Extubated, doing well on BiPAP 6/03  Transfered to SDU  LINES / TUBES: ETT 5/30 >>> 6/02  CULTURES: 5/30  Sputum >>> neg 5/30  Blood >>> neg  ANTIBIOTICS: Levaquin 5/30 >>> 6/5  INTERVAL HISTORY:  No complaints.  No acute overnight events.  VITAL SIGNS: Temp:  [97.5 F (36.4 C)-98.4 F (36.9 C)] 98 F (36.7 C) (06/06 0735) Pulse Rate:  [61-90] 61 (06/06 0329) Resp:  [18-22] 18 (06/06 0735) BP: (93-125)/(55-92) 125/85 mmHg (06/06 0735) SpO2:  [80 %-100 %] 96 % (06/06 0804) FiO2 (%):  [40 %] 40 % (06/05 1034)  VENTILATOR SETTINGS: Vent Mode:  [-]  FiO2 (%):  [40 %] 40 % INTAKE / OUTPUT: Intake/Output     06/05 0701 - 06/06 0700 06/06 0701 - 06/07 0700   P.O. 1320    IV Piggyback 100    Total Intake(mL/kg) 1420 (11.4)    Urine (mL/kg/hr) 650 (0.2)    Total Output 650     Net +770          Urine Occurrence 1 x    Stool Occurrence 1 x      PHYSICAL EXAMINATION: General: Obese, resting in chair, no distress Neuro:  Awake, alert HEENT:  No JVD Cardiovascular: Regular, no murmurs Lungs: Bilateral diminished air entry, few wheezes / rhonchi Abdomen:  Soft, bowel sounds present Musculoskeletal: Trace edema Skin:  No rashes  LABS:  CBC  Recent Labs Lab 04/27/14 0335  04/28/14 0225 04/29/14 0300  WBC 9.5 16.6* 19.5*  HGB 11.2* 12.8* 13.9  HCT 38.2* 42.3 45.2  PLT 272 402* 506*   BMET  Recent Labs Lab 04/28/14 0225 04/29/14 0300 04/30/14 0448  NA 147 146 142  K 4.1 5.1 3.5*  CL 98 97 97  CO2 39* 35* 38*  BUN 31* 38* 36*  CREATININE 0.83 0.88 1.03  GLUCOSE 150* 122* 102*   Electrolytes  Recent Labs Lab 04/25/14 0621  04/28/14 0225 04/29/14 0300 04/30/14 0448  CALCIUM 8.6  < > 9.5 9.6 9.0  MG 2.1  --   --  2.7*  --   PHOS 3.2  --   --   --   --   < > = values in this interval not displayed. Sepsis Markers No results found for this basename: LATICACIDVEN, PROCALCITON, O2SATVEN,  in the last 168 hours ABG  Recent Labs Lab 04/26/14 0828 04/28/14 0432 04/30/14 0555  PHART 7.297* 7.429 7.396  PCO2ART 75.6* 62.4* 64.6*  PO2ART 105.0* 88.9 98.3   Liver Enzymes  Recent Labs Lab 04/25/14 0621 04/29/14 0300  AST 32 54*  ALT 32 70*  ALKPHOS 76 58  BILITOT <0.2* 0.7  ALBUMIN 3.5 3.7   Glucose  Recent Labs Lab 04/29/14 2124 04/30/14 0739 04/30/14 1148 04/30/14 1605 04/30/14 2122  05/01/14 0734  GLUCAP 123* 93 152* 129* 110* 100*   IMAGING: No results found.  ASSESSMENT / PLAN:  PULMONARY A:  Acute on chronic respiratory failure AECOPD Probable OSA / OHS P:   Goal SpO2>88 Supplemental oxygen BiPAP hs and prn Albuterol / Atrovent / Brovana / Pulmiocort Prednisone 20 Polysomnography after d/c CXR in am   CARDIOVASCULAR A:  HTN Chronic diastolic heart failure Sinus tachycardia, resolved P:  Bisoprolol, Losartan, HCTZ, Clonidine  RENAL A:  No active issues P: No intervention required  GASTROINTESTINAL A:   Nutrition. GERD P:   Diet Protonix Bowel regimen   HEMATOLOGIC A:   Leukocytosis, likely secondary to systemic steroids VTE Px P:  Trend CBC Heparin   INFECTIOUS A:   AECOPD Sinusitis? P:   Levaquin completed  ENDOCRINE A:   Steroid induced hyperglycemia P:     SSI  NEUROLOGIC A:   Acute encephalopathy, resolved Anxiety Chronic insomnia  P: Xanax, Zoloft, Trazodone PT/OT Activity as tolerated  I have personally obtained history, examined patient, evaluated and interpreted laboratory and imaging results, reviewed medical records, formulated assessment / plan and placed orders.   Doree Fudge, MD Pulmonary and [place] Pager: [phone]  05/01/2014, 10:16 AM

## 2014-05-02 ENCOUNTER — Inpatient Hospital Stay (HOSPITAL_COMMUNITY): Payer: 59

## 2014-05-02 LAB — CBC
HCT: 37.4 % — ABNORMAL LOW (ref 39.0–52.0)
Hemoglobin: 11.3 g/dL — ABNORMAL LOW (ref 13.0–17.0)
MCH: 26 pg (ref 26.0–34.0)
MCHC: 30.2 g/dL (ref 30.0–36.0)
MCV: 86 fL (ref 78.0–100.0)
Platelets: 308 10*3/uL (ref 150–400)
RBC: 4.35 MIL/uL (ref 4.22–5.81)
RDW: 14.1 % (ref 11.5–15.5)
WBC: 17.2 10*3/uL — ABNORMAL HIGH (ref 4.0–10.5)

## 2014-05-02 LAB — GLUCOSE, CAPILLARY
Glucose-Capillary: 107 mg/dL — ABNORMAL HIGH (ref 70–99)
Glucose-Capillary: 112 mg/dL — ABNORMAL HIGH (ref 70–99)
Glucose-Capillary: 140 mg/dL — ABNORMAL HIGH (ref 70–99)
Glucose-Capillary: 106 mg/dL — ABNORMAL HIGH (ref 70–99)

## 2014-05-02 LAB — COMPREHENSIVE METABOLIC PANEL
ALT: 42 U/L (ref 0–53)
AST: 19 U/L (ref 0–37)
Albumin: 3.1 g/dL — ABNORMAL LOW (ref 3.5–5.2)
Alkaline Phosphatase: 55 U/L (ref 39–117)
BUN: 25 mg/dL — ABNORMAL HIGH (ref 6–23)
CO2: 37 mEq/L — ABNORMAL HIGH (ref 19–32)
Calcium: 8.9 mg/dL (ref 8.4–10.5)
Chloride: 98 mEq/L (ref 96–112)
Creatinine, Ser: 0.92 mg/dL (ref 0.50–1.35)
GFR calc Af Amer: >90 mL/min (ref >90)
GFR calc non Af Amer: >90 mL/min (ref >90)
Glucose, Bld: 115 mg/dL — ABNORMAL HIGH (ref 70–99)
Potassium: 3.7 mEq/L (ref 3.7–5.3)
Sodium: 142 mEq/L (ref 137–147)
Total Bilirubin: 0.5 mg/dL (ref 0.3–1.2)
Total Protein: 5.7 g/dL — ABNORMAL LOW (ref 6.0–8.3)

## 2014-05-02 NOTE — Progress Notes (Signed)
PULMONARY / CRITICAL CARE MEDICINE   Name: Isaac [name] MRN: [id number] DOB: 1956-01-30    ADMISSION DATE:  04/24/2014  REFERRING MD :  Sharol Given  CHIEF COMPLAINT:  SOB  BRIEF PATIENT DESCRIPTION: 58 yo smoker with progressive dyspnea from AECOPD.  Intubated in ED. Extubated on 6/02 and doing well with BiPAP HS and venti mask during the day. Transferred to SDU 6/3. On 6/4 he continues to do well with venti mask during the day and hemodynamics remaining stable. Followed by Dr. Chase Caller.  SIGNIFICANT EVENTS / STUDIES: 5/30  Admited 5/30  TTE >>> EF 67%, grade 1 diastolic dysfunction 6/19  MRI brain >>> Mild exophthalmos, paranasal sinus thickening, partial mastoid opacification b/l 6/02  Extubated, doing well on BiPAP 6/03  Transfered to SDU  LINES / TUBES: ETT 5/30 >>> 6/02  CULTURES: 5/30  Sputum >>> neg 5/30  Blood >>> neg  ANTIBIOTICS: Levaquin 5/30 >>> 6/5  INTERVAL HISTORY:  Improved oxygen requirements.   VITAL SIGNS: Temp:  [97.6 F (36.4 C)-99 F (37.2 C)] 99 F (37.2 C) (06/07 0723) Pulse Rate:  [60-90] 60 (06/07 0300) Resp:  [15-24] 15 (06/07 0723) BP: (84-127)/(45-65) 127/65 mmHg (06/07 0723) SpO2:  [91 %-100 %] 97 % (06/07 0908)  VENTILATOR SETTINGS:   INTAKE / OUTPUT: Intake/Output     06/06 0701 - 06/07 0700 06/07 0701 - 06/08 0700   P.O. 240 250   IV Piggyback     Total Intake(mL/kg) 240 (1.9) 250 (2)   Urine (mL/kg/hr) 725 (0.2)    Total Output 725     Net -485 +250        Urine Occurrence 2 x    Stool Occurrence 3 x      PHYSICAL EXAMINATION: General: Obese, resting in chair, no distress Neuro:  Awake, alert HEENT:  No JVD Cardiovascular: Regular, no murmurs Lungs: Bilateral diminished air entry, few wheezes / rhonchi Abdomen:  Soft, bowel sounds present Musculoskeletal: Trace edema Skin:  No rashes  LABS:  CBC  Recent Labs Lab 04/28/14 0225 04/29/14 0300 05/02/14 0238  WBC 16.6* 19.5* 17.2*  HGB 12.8* 13.9 11.3*  HCT 42.3  45.2 37.4*  PLT 402* 506* 308   BMET  Recent Labs Lab 04/29/14 0300 04/30/14 0448 05/02/14 0238  NA 146 142 142  K 5.1 3.5* 3.7  CL 97 97 98  CO2 35* 38* 37*  BUN 38* 36* 25*  CREATININE 0.88 1.03 0.92  GLUCOSE 122* 102* 115*   Electrolytes  Recent Labs Lab 04/29/14 0300 04/30/14 0448 05/02/14 0238  CALCIUM 9.6 9.0 8.9  MG 2.7*  --   --    Sepsis Markers No results found for this basename: LATICACIDVEN, PROCALCITON, O2SATVEN,  in the last 168 hours ABG  Recent Labs Lab 04/26/14 0828 04/28/14 0432 04/30/14 0555  PHART 7.297* 7.429 7.396  PCO2ART 75.6* 62.4* 64.6*  PO2ART 105.0* 88.9 98.3   Liver Enzymes  Recent Labs Lab 04/29/14 0300 05/02/14 0238  AST 54* 19  ALT 70* 42  ALKPHOS 58 55  BILITOT 0.7 0.5  ALBUMIN 3.7 3.1*   Glucose  Recent Labs Lab 04/30/14 2122 05/01/14 0734 05/01/14 1143 05/01/14 1613 05/01/14 2145 05/02/14 0759  GLUCAP 110* 100* 110* 168* 112* 107*   IMAGING: Dg Chest Port 1 View  05/02/2014   CLINICAL DATA:  Shortness of breath.  EXAM: PORTABLE CHEST - 1 VIEW  COMPARISON:  Sixty-two thousand fifteen.  FINDINGS: The endotracheal tube and NG tubes been removed. The heart is upper limits  of normal in size and stable. The mediastinal and hilar contours are normal. The lungs are clear. No pleural effusion. The bony thorax is intact.  IMPRESSION: No acute cardiopulmonary findings.   Electronically Signed   By: Kalman Jewels M.D.   On: 05/02/2014 06:42    ASSESSMENT / PLAN:  PULMONARY A:  Acute on chronic respiratory failure AECOPD Probable OSA / OHS No airspace disease on PCXR 6/6 P:   Goal SpO2>88 Supplemental oxygen BiPAP hs and prn Albuterol / Atrovent / Brovana / Pulmiocort Prednisone 20 Polysomnography after d/c   CARDIOVASCULAR A:  HTN Chronic diastolic heart failure Sinus tachycardia, resolved P:  Bisoprolol, Losartan, HCTZ, Clonidine  RENAL A:  No active issues P: No intervention  required  GASTROINTESTINAL A:   Nutrition. GERD P:   Diet Protonix Bowel regimen   HEMATOLOGIC A:   Leukocytosis, likely secondary to systemic steroids VTE Px P:  Trend CBC Heparin   INFECTIOUS A:   AECOPD Sinusitis? P:   Levaquin completed  ENDOCRINE A:   Steroid induced hyperglycemia P:    SSI  NEUROLOGIC A:   Acute encephalopathy, resolved Anxiety Chronic insomnia  P: Xanax, Zoloft, Trazodone PT/OT Activity as tolerated  I have personally obtained history, examined patient, evaluated and interpreted laboratory and imaging results, reviewed medical records, formulated assessment / plan and placed orders.  Doree Fudge, MD Pulmonary and [place] Pager: [phone]  05/02/2014, 10:56 AM

## 2014-05-03 LAB — BASIC METABOLIC PANEL
BUN: 21 mg/dL (ref 6–23)
CO2: 33 mEq/L — ABNORMAL HIGH (ref 19–32)
Calcium: 8.8 mg/dL (ref 8.4–10.5)
Chloride: 97 mEq/L (ref 96–112)
Creatinine, Ser: 0.95 mg/dL (ref 0.50–1.35)
GFR calc Af Amer: >90 mL/min (ref >90)
GFR calc non Af Amer: >90 mL/min (ref >90)
Glucose, Bld: 101 mg/dL — ABNORMAL HIGH (ref 70–99)
Potassium: 3.5 mEq/L — ABNORMAL LOW (ref 3.7–5.3)
Sodium: 140 mEq/L (ref 137–147)

## 2014-05-03 LAB — CBC
HCT: 36.6 % — ABNORMAL LOW (ref 39.0–52.0)
Hemoglobin: 11.2 g/dL — ABNORMAL LOW (ref 13.0–17.0)
MCH: 26.3 pg (ref 26.0–34.0)
MCHC: 30.6 g/dL (ref 30.0–36.0)
MCV: 85.9 fL (ref 78.0–100.0)
Platelets: 303 10*3/uL (ref 150–400)
RBC: 4.26 MIL/uL (ref 4.22–5.81)
RDW: 14.3 % (ref 11.5–15.5)
WBC: 15.8 10*3/uL — ABNORMAL HIGH (ref 4.0–10.5)

## 2014-05-03 LAB — GLUCOSE, CAPILLARY
Glucose-Capillary: 97 mg/dL (ref 70–99)
Glucose-Capillary: 153 mg/dL — ABNORMAL HIGH (ref 70–99)
Glucose-Capillary: 115 mg/dL — ABNORMAL HIGH (ref 70–99)
Glucose-Capillary: 108 mg/dL — ABNORMAL HIGH (ref 70–99)

## 2014-05-03 MED ORDER — POTASSIUM CHLORIDE CRYS ER 20 MEQ PO TBCR
40.0000 meq | EXTENDED_RELEASE_TABLET | Freq: Once | ORAL | Status: AC
  Administered 2014-05-03: 40 meq via ORAL
  Filled 2014-05-03: qty 2

## 2014-05-03 NOTE — Progress Notes (Signed)
Occupational Therapy Treatment Patient Details Name: Isaac [name] MRN: [id number] DOB: [birth year] Today's Date: 05/03/2014    History of present illness Pt admit with COPD exacerbation.   OT comments  Pt educated and given information on E conservation techniques. Pt exprressed his frustrations with wanting to do more but feeling limited due to O2 availability. Discussed with [facility] rep who recommended pt be assessed for all portable O2 needs. Left sticky note for MD to write script to "evaluate for all portable O2 needs".  Follow Up Recommendations  Home health OT;Supervision - Intermittent    Equipment Recommendations  None recommended by OT    Recommendations for Other Services      Precautions / Restrictions Precautions Precautions: None       Mobility Bed Mobility Overal bed mobility: Modified Independent                Transfers Overall transfer level: Needs assistance   Transfers: Sit to/from Stand;Stand Pivot Transfers Sit to Stand: Supervision Stand pivot transfers: Supervision            Balance Overall balance assessment: No apparent balance deficits (not formally assessed)                                 ADL                                         General ADL Comments: Educated on E conservation with ADL and IADL tasks. Pt given written iinformation and available resources. Pt discussed his frustration with wanting to get out of the house and do more mbut being limited du to timing of O2 availability. [facility] called and discussed available options. Asked for script for HHRT to assess for portable O2 needs.      Vision                     Perception     Praxis      Cognition   Behavior During Therapy: WFL for tasks assessed/performed Overall Cognitive Status: Within Functional Limits for tasks assessed                       Extremity/Trunk Assessment               Exercises      Shoulder Instructions       General Comments      Pertinent Vitals/ Pain       VSS; no c/o pain  Home Living                                          Prior Functioning/Environment              Frequency       Progress Toward Goals  OT Goals(current goals can now be found in the care plan section)  Progress towards OT goals: Goals met/education completed, patient discharged from OT (all further Ot to be addressed by [facility])  Acute Rehab OT Goals Patient Stated Goal: to go home OT Goal Formulation: With patient Time For Goal Achievement: 05/14/14 Potential to Achieve Goals: Good ADL Goals Pt Will Perform Grooming: with modified independence;standing Pt Will Perform Upper  Body Bathing: with supervision;sitting Pt Will Perform Lower Body Bathing: with supervision;sit to/from stand Pt Will Transfer to Toilet: with supervision;ambulating;bedside commode Pt Will Perform Tub/Shower Transfer: with min assist;Tub transfer;shower seat;ambulating  Plan Discharge plan remains appropriate;Other (comment) (all further OT to be completed by [facility])    Co-evaluation                 End of Session     Activity Tolerance Patient tolerated treatment well   Patient Left in chair;with call bell/phone within reach;with family/visitor present   Nurse Communication Mobility status;Precautions        Time: 6195-0932 OT Time Calculation (min): 40 min  Charges: OT General Charges $OT Visit: 1 Procedure OT Treatments $Self Care/Home Management : 38-52 mins  Roney Jaffe Calob B[place]ette 05/03/2014, 5:33 PM   [facility], OTR/L  [phone] 05/03/2014

## 2014-05-03 NOTE — Progress Notes (Signed)
PULMONARY / CRITICAL CARE MEDICINE   Name: Isaac [name] MRN: [id number] DOB: [birth year]    ADMISSION DATE:  04/24/2014  REFERRING MD :  Sharol Given  CHIEF COMPLAINT:  SOB  BRIEF PATIENT DESCRIPTION: 58 yo smoker with progressive dyspnea from AECOPD.  Intubated in ED. Extubated on 6/02 and doing well with BiPAP HS and venti mask during the day. Transferred to SDU 6/3. On 6/4 he continues to do well with venti mask during the day and hemodynamics remaining stable. Followed by Dr. Chase Caller.  SIGNIFICANT EVENTS / STUDIES: 5/30  Admited 5/30  TTE >>> EF 34%, grade 1 diastolic dysfunction 1/93  MRI brain >>> Mild exophthalmos, paranasal sinus thickening, partial mastoid opacification b/l 6/02  Extubated, doing well on BiPAP 6/03  Transfered to SDU  LINES / TUBES: ETT 5/30 >>> 6/02  CULTURES: 5/30  Sputum >>> neg 5/30  Blood >>> neg  ANTIBIOTICS: Levaquin 5/30 >>> 6/5  INTERVAL HISTORY:  No acute events overnight.  Reports breathing being almost at baseline.  VITAL SIGNS: Temp:  [98.3 F (36.8 C)-99.1 F (37.3 C)] 98.5 F (36.9 C) (06/08 0754) Pulse Rate:  [52-66] 66 (06/08 0754) Resp:  [16-23] 16 (06/08 0754) BP: (92-135)/(54-96) 111/63 mmHg (06/08 0754) SpO2:  [93 %-100 %] 98 % (06/08 0754) FiO2 (%):  [100 %] 100 % (06/08 0139)  VENTILATOR SETTINGS: Vent Mode:  [-]  FiO2 (%):  [100 %] 100 %  INTAKE / OUTPUT: Intake/Output     06/07 0701 - 06/08 0700 06/08 0701 - 06/09 0700   P.O. 490    Total Intake(mL/kg) 490 (3.9)    Urine (mL/kg/hr) 1200 (0.4)    Total Output 1200     Net -710           PHYSICAL EXAMINATION: General: No distress, working with PT Neuro:  Awake, alert, cooperative HEENT:  No JVD Cardiovascular: Regular, no murmurs Lungs: Bilateral diminished air entry, no added sounds Abdomen:  Soft, bowel sounds present Musculoskeletal: Trace edema Skin:  No rashes  LABS:  CBC  Recent Labs Lab 04/29/14 0300 05/02/14 0238 05/03/14 0254  WBC 19.5*  17.2* 15.8*  HGB 13.9 11.3* 11.2*  HCT 45.2 37.4* 36.6*  PLT 506* 308 303   BMET  Recent Labs Lab 04/30/14 0448 05/02/14 0238 05/03/14 0254  NA 142 142 140  K 3.5* 3.7 3.5*  CL 97 98 97  CO2 38* 37* 33*  BUN 36* 25* 21  CREATININE 1.03 0.92 0.95  GLUCOSE 102* 115* 101*   Electrolytes  Recent Labs Lab 04/29/14 0300 04/30/14 0448 05/02/14 0238 05/03/14 0254  CALCIUM 9.6 9.0 8.9 8.8  MG 2.7*  --   --   --    Sepsis Markers No results found for this basename: LATICACIDVEN, PROCALCITON, O2SATVEN,  in the last 168 hours ABG  Recent Labs Lab 04/28/14 0432 04/30/14 0555  PHART 7.429 7.396  PCO2ART 62.4* 64.6*  PO2ART 88.9 98.3   Liver Enzymes  Recent Labs Lab 04/29/14 0300 05/02/14 0238  AST 54* 19  ALT 70* 42  ALKPHOS 58 55  BILITOT 0.7 0.5  ALBUMIN 3.7 3.1*   Glucose  Recent Labs Lab 05/01/14 2145 05/02/14 0759 05/02/14 1137 05/02/14 1607 05/02/14 2150 05/03/14 0755  GLUCAP 112* 107* 112* 140* 106* 97   IMAGING: Dg Chest Port 1 View  05/02/2014   CLINICAL DATA:  Shortness of breath.  EXAM: PORTABLE CHEST - 1 VIEW  COMPARISON:  Sixty-two thousand fifteen.  FINDINGS: The endotracheal tube and NG tubes been  removed. The heart is upper limits of normal in size and stable. The mediastinal and hilar contours are normal. The lungs are clear. No pleural effusion. The bony thorax is intact.  IMPRESSION: No acute cardiopulmonary findings.   Electronically Signed   By: Kalman Jewels M.D.   On: 05/02/2014 06:42    ASSESSMENT / PLAN:  PULMONARY A:  Acute on chronic respiratory failure AECOPD Probable OSA / OHS No airspace disease on PCXR 6/6 P:   Goal SpO2>88 Supplemental oxygen BiPAP hs and prn Albuterol / Atrovent / Brovana / Pulmiocort Prednisone 20, needs to complete total of 14 days taper DME orders placed for home BiPAP ( 12/5 ) and liquid oxygen / portable concentrator Polysomnography after d/c   CARDIOVASCULAR A:  HTN Chronic diastolic  heart failure Sinus tachycardia, resolved P:  Bisoprolol, Losartan, HCTZ, Clonidine Needs to record dry weight upon discharge and contact MD / start diuretics if increases   RENAL A:  Hypokalemia P: K 40  GASTROINTESTINAL A:   Nutrition. GERD P:   Diet Protonix Bowel regimen   HEMATOLOGIC A:   Leukocytosis, likely secondary to systemic steroids VTE Px P:  Trend CBC Heparin   INFECTIOUS A:   AECOPD Sinusitis? P:   Levaquin completed  ENDOCRINE A:   Steroid induced hyperglycemia P:    SSI  NEUROLOGIC A:   Acute encephalopathy, resolved Anxiety Chronic insomnia  P: Xanax, Zoloft, Trazodone PT/OT Activity as tolerated DME order placed for rolling walker  Tentative d/c 6/9.  I have personally obtained history, examined patient, evaluated and interpreted laboratory and imaging results, reviewed medical records, formulated assessment / plan and placed orders.  Doree Fudge, MD Pulmonary and [place] Pager: [phone]  05/03/2014, 10:27 AM

## 2014-05-03 NOTE — Progress Notes (Signed)
Physical Therapy Treatment Patient Details Name: Isaac [name] MRN: [id number] DOB: 1956-03-02 Today's Date: 05/03/2014    History of Present Illness Pt admit with COPD exacerbation.    PT Comments    Pt progressing towards physical therapy goals. Reviewed HEP that was taught last session and added 3 more exercises. Pt states he feels ready to go home, but would prefer to wait until tomorrow. Tolerance for functional activity is improving, however would like to see pt ambulate increased distance prior to d/c to ensure he can get in/around his home without difficulty. Session limited today by increased time in the bathroom to void his bowels and clean himself up.  Follow Up Recommendations  Home health PT;Supervision/Assistance - 24 hour     Equipment Recommendations  Rolling walker with 5" wheels;3in1 (PT)    Recommendations for Other Services       Precautions / Restrictions Precautions Precautions: Fall Restrictions Weight Bearing Restrictions: No    Mobility  Bed Mobility               General bed mobility comments: Pt received sitting on toilet in bathroom  Transfers Overall transfer level: Needs assistance Equipment used: None Transfers: Sit to/from Stand Sit to Stand: Min guard         General transfer comment: Pt able to power-up to min guard with no physical assist. Holding onto grab bars in bathroom for support while therapist assisted him with peri care.  Ambulation/Gait Ambulation/Gait assistance: Min assist Ambulation Distance (Feet): 15 Feet Assistive device: None (Furniture walking) Gait Pattern/deviations: Step-through pattern;Decreased stride length;Trunk flexed Gait velocity: Decreased Gait velocity interpretation: Below normal speed for age/gender General Gait Details: Pt using furniture for support while ambulating from bathroom to recliner chair. Able to take a few steps at a time without assist, otherwise steadying assist required.     Stairs            Wheelchair Mobility    Modified Rankin (Stroke Patients Only)       Balance Overall balance assessment: Needs assistance Sitting-balance support: Feet supported;No upper extremity supported Sitting balance-Leahy Scale: Fair     Standing balance support: Single extremity supported;During functional activity Standing balance-Leahy Scale: Fair Standing balance comment: Pt able to stand statically in bathroom without assist as he had UE support on grab bars.                     Cognition Arousal/Alertness: Awake/alert Behavior During Therapy: WFL for tasks assessed/performed Overall Cognitive Status: Within Functional Limits for tasks assessed                      Exercises General Exercises - Lower Extremity Ankle Circles/Pumps: 15 reps Quad Sets: 15 reps Short Arc Quad: 15 reps Straight Leg Raises: 15 reps    General Comments        Pertinent Vitals/Pain Vitals stable throughout this time.     Home Living                      Prior Function            PT Goals (current goals can now be found in the care plan section) Acute Rehab PT Goals Patient Stated Goal: to go home PT Goal Formulation: With patient Time For Goal Achievement: 05/05/14 Potential to Achieve Goals: Good Progress towards PT goals: Progressing toward goals    Frequency  Min 3X/week    PT Plan Current  plan remains appropriate    Co-evaluation             End of Session Equipment Utilized During Treatment: Oxygen Activity Tolerance: Patient limited by fatigue Patient left: in chair;with call bell/phone within reach     Time: 1791-5056 PT Time Calculation (min): 40 min  Charges:  $Therapeutic Exercise: 8-22 mins $Therapeutic Activity: 23-37 mins                    G Codes:      Jolyn Lent 2014/05/05, 12:46 PM  Jolyn Lent, PT, DPT Acute Rehabilitation Services Pager: [phone]

## 2014-05-04 ENCOUNTER — Encounter (HOSPITAL_COMMUNITY): Payer: Self-pay | Admitting: Pulmonary Disease

## 2014-05-04 LAB — BASIC METABOLIC PANEL
BUN: 19 mg/dL (ref 6–23)
CO2: 34 mEq/L — ABNORMAL HIGH (ref 19–32)
Calcium: 8.8 mg/dL (ref 8.4–10.5)
Chloride: 96 mEq/L (ref 96–112)
Creatinine, Ser: 0.99 mg/dL (ref 0.50–1.35)
GFR calc Af Amer: >90 mL/min (ref >90)
GFR calc non Af Amer: 88 mL/min — ABNORMAL LOW (ref >90)
Glucose, Bld: 94 mg/dL (ref 70–99)
Potassium: 3.6 mEq/L — ABNORMAL LOW (ref 3.7–5.3)
Sodium: 140 mEq/L (ref 137–147)

## 2014-05-04 LAB — GLUCOSE, CAPILLARY
Glucose-Capillary: 99 mg/dL (ref 70–99)
Glucose-Capillary: 118 mg/dL — ABNORMAL HIGH (ref 70–99)
Glucose-Capillary: 115 mg/dL — ABNORMAL HIGH (ref 70–99)
Glucose-Capillary: 101 mg/dL — ABNORMAL HIGH (ref 70–99)

## 2014-05-04 LAB — CBC
HCT: 36.2 % — ABNORMAL LOW (ref 39.0–52.0)
Hemoglobin: 10.8 g/dL — ABNORMAL LOW (ref 13.0–17.0)
MCH: 25.8 pg — ABNORMAL LOW (ref 26.0–34.0)
MCHC: 29.8 g/dL — ABNORMAL LOW (ref 30.0–36.0)
MCV: 86.6 fL (ref 78.0–100.0)
Platelets: 336 10*3/uL (ref 150–400)
RBC: 4.18 MIL/uL — ABNORMAL LOW (ref 4.22–5.81)
RDW: 14.4 % (ref 11.5–15.5)
WBC: 15.9 10*3/uL — ABNORMAL HIGH (ref 4.0–10.5)

## 2014-05-04 MED ORDER — GERHARDT'S BUTT CREAM
TOPICAL_CREAM | Freq: Two times a day (BID) | CUTANEOUS | Status: DC
  Administered 2014-05-04 (×2): via TOPICAL
  Filled 2014-05-04: qty 1

## 2014-05-04 NOTE — Progress Notes (Signed)
Physical Therapy Treatment Patient Details Name: Isaac [name] MRN: [id number] DOB: 1956/01/11 Today's Date: 05/04/2014    History of Present Illness Pt admit with COPD exacerbation.    PT Comments    Progressing well.  Knows when to stop and rests.  Uses pursed lip breathing appropriately.  Follow Up Recommendations  Home health PT;Supervision/Assistance - 24 hour     Equipment Recommendations  None recommended by PT    Recommendations for Other Services       Precautions / Restrictions Precautions Precautions: None Restrictions Weight Bearing Restrictions: No    Mobility  Bed Mobility Overal bed mobility: Modified Independent                Transfers Overall transfer level: Modified independent Equipment used: None Transfers: Sit to/from Stand           General transfer comment: Moves safely and knows when to stay put to recover vs when it's safe to mobilize  Ambulation/Gait Ambulation/Gait assistance: Supervision Ambulation Distance (Feet): 25 Feet (x4) Assistive device: None   Gait velocity: Decreased   General Gait Details: steady without assist or use of furniture.   Stairs            Wheelchair Mobility    Modified Rankin (Stroke Patients Only)       Balance Overall balance assessment: Needs assistance Sitting-balance support: No upper extremity supported Sitting balance-Leahy Scale: Good     Standing balance support: During functional activity;No upper extremity supported Standing balance-Leahy Scale: Fair                      Cognition Arousal/Alertness: Awake/alert Behavior During Therapy: WFL for tasks assessed/performed Overall Cognitive Status: Within Functional Limits for tasks assessed                      Exercises General Exercises - Lower Extremity Hip Flexion/Marching: AROM;Both;10 reps;Standing Mini-Sqauts: AROM;10 reps;Strengthening;Standing    General Comments        Pertinent  Vitals/Pain 9293% on 2L [place]  EH$ 102-110 with minimal exertion.    Home Living                      Prior Function            PT Goals (current goals can now be found in the care plan section) Acute Rehab PT Goals Patient Stated Goal: to go home PT Goal Formulation: With patient Time For Goal Achievement: 05/05/14 Potential to Achieve Goals: Good Progress towards PT goals: Progressing toward goals    Frequency  Min 3X/week    PT Plan Current plan remains appropriate    Co-evaluation             End of Session Equipment Utilized During Treatment: Oxygen Activity Tolerance: Patient limited by fatigue;Patient tolerated treatment well;Other (comment) (and dyspnea) Patient left: in chair;with call bell/phone within reach     Time: 1019-1046 PT Time Calculation (min): 27 min  Charges:  $Gait Training: 8-22 mins $Therapeutic Exercise: 8-22 mins                    G Codes:      [employer] V 05/04/2014, 11:03 AM 05/04/2014  Donnella Sham, PT [phone]  (pager)

## 2014-05-04 NOTE — Discharge Summary (Signed)
Physician Discharge Summary  Patient ID: WOJCIECH WILLETTS MRN: [id number] DOB/AGE: January 27, 1956 58 y.o.  Admit date: 04/24/2014 Discharge date: 05/05/2014    Discharge Diagnoses:  Acute on Chronic Respiratory Failure Acute Exacerbation of COPD Probable OSA / OHS HTN Chronic Diastolic CHF Sinus Tachycardia Hypokalemia GERD Leukocytosis Steroid Induced Hyperglycemia Acute Encephalopathy Anxiety Chronic Insomnia           DISCHARGE SUMMARY   JENNY OMDAHL is a 58 y.o. y/o male, smoker, with a PMH of GERD, HTN, HLD, Anxiety, Insomnia, & COPD who presented 5/30 with a 3-4 day history of progressive dyspnea.  He failed CPAP via EMS and was intubated in the ER.  Treated in the usual fashion which included: support with vent, scheduled bronchodilators, systemic steroids, and empiric antibiotics. He was  extubated on 6/02 and doing well with BiPAP HS and venti mask during the day. Transferred to SDU 6/3. On 6/4 he continued to do well with venti mask during the day and hemodynamics remaining stable. He was moved to the medical ward. Treatments were stepped down and titrated to home dosing regimen. He continued to make slow progress to the point where we felt he was stable for d/c as of 6/10. He was d/c to home with the following plan of care.     DISCHARGE PLAN BY DIAGNOSIS   Acute on chronic respiratory failure  AECOPD, possibly exacerbated by acute sinusitis  Probable OSA / OHS  No airspace disease on PCXR 6/6  Plan:   Prednisone 20, needs to complete total of 14 days taper   DME orders placed for home BiPAP ( 12/5 ) and liquid oxygen / portable concentrator   Polysomnography after d/c   Home evaluation for portable O2 needs  HTN  Chronic diastolic heart failure  Plan:   Bisoprolol, Losartan, HCTZ, Clonidine   Needs to record dry weight upon discharge and contact MD / start diuretics if increases  Hypokalemia  Plan:   K 40 QD  GERD  Plan:   Bowel regimen  \ Anxiety   Chronic insomnia  Physical deconditioning s/p critical illness  Plan:   Xanax, Zoloft, Trazodone   PT/OT   Activity as tolerated   DME order placed for rolling walker            SIGNIFICANT EVENTS / STUDIES:  5/30 Admited  5/30 TTE >>> EF 16%, grade 1 diastolic dysfunction  0/73 MRI brain >>> Mild exophthalmos, paranasal sinus thickening, partial mastoid opacification b/l  6/02 Extubated, doing well on BiPAP  6/03 Transfered to SDU   LINES / TUBES:  ETT 5/30 >>> 6/02   CULTURES:  5/30 Sputum >>> neg  5/30 Blood >>> neg   ANTIBIOTICS:  Levaquin 5/30 >>> 6/5   Discharge Exam: General: No distress, working with PT  Neuro: Awake, alert, cooperative  HEENT: No JVD  Cardiovascular: Regular, no murmurs  Lungs: Bilateral diminished air entry, no added sounds  Abdomen: Soft, bowel sounds present  Musculoskeletal: Trace edema  Skin: No rashes   Filed Vitals:   05/05/14 0000 05/05/14 0256 05/05/14 0312 05/05/14 0547  BP:    115/63  Pulse: 68 72 69 72  Temp:    97.4 F (36.3 C)  TempSrc:    Axillary  Resp: _0 Height:      Weight:      SpO2: 93% 96% 92% 98%  2 liters  General: No distress, up to chair  Neuro: Awake, alert, cooperative. Anxious.  HEENT: No  JVD  Cardiovascular: Regular, no murmurs  Lungs: Bilateral diminished air entry, no adventitious sounds no accessory muscle use  Abdomen: Soft, bowel sounds present  Musculoskeletal: Trace edema  Skin: No rashes   Discharge Labs  BMET  Recent Labs Lab 04/29/14 0300 04/30/14 0448 05/02/14 0238 05/03/14 0254 05/04/14 0324  NA 146 142 142 140 140  K 5.1 3.5* 3.7 3.5* 3.6*  CL 97 97 98 97 96  CO2 35* 38* 37* 33* 34*  GLUCOSE 122* 102* 115* 101* 94  BUN 38* 36* 25* 21 19  CREATININE 0.88 1.03 0.92 0.95 0.99  CALCIUM 9.6 9.0 8.9 8.8 8.8  MG 2.7*  --   --   --   --    CBC  Recent Labs Lab 05/02/14 0238 05/03/14 0254 05/04/14 0324  HGB 11.3* 11.2* 10.8*  HCT 37.4* 36.6* 36.2*  WBC 17.2*  15.8* 15.9*  PLT 308 303 336      Medication List    STOP taking these medications       doxycycline 100 MG tablet  Commonly known as:  VIBRA-TABS      TAKE these medications       ALPRAZolam 0.25 MG tablet  Commonly known as:  XANAX  Take 1 tablet (0.25 mg total) by mouth every 4 (four) hours as needed for anxiety.     azelastine 0.1 % nasal spray  Commonly known as:  ASTELIN  Place 1 spray into both nostrils daily as needed for allergies.     budesonide 0.5 MG/2ML nebulizer solution  Commonly known as:  PULMICORT  Take 0.5 mg by nebulization 2 (two) times daily.     cloNIDine 0.2 MG tablet  Commonly known as:  CATAPRES  Take 0.2 mg by mouth 3 (three) times daily.     fluticasone 50 MCG/ACT nasal spray  Commonly known as:  FLONASE  Place 2 sprays into both nostrils daily.     ipratropium 0.02 % nebulizer solution  Commonly known as:  ATROVENT  Take 0.5 mg by nebulization 4 (four) times daily.     losartan-hydrochlorothiazide 50-12.5 MG per tablet  Commonly known as:  HYZAAR  Take 1 tablet by mouth daily.     montelukast 10 MG tablet  Commonly known as:  SINGULAIR  Take 10 mg by mouth at bedtime.     omeprazole 20 MG capsule  Commonly known as:  PRILOSEC  Take 2 capsules (40 mg total) by mouth daily. For GERD.     predniSONE 10 MG tablet  Commonly known as:  DELTASONE  Take two tabs daily for 4 days, then 1 tab for 4 days, then 1/2 tab for 4 days     sertraline 25 MG tablet  Commonly known as:  ZOLOFT  Take 1 tablet by mouth daily.     sodium chloride 0.65 % Soln nasal spray  Commonly known as:  OCEAN  Place 2 sprays into both nostrils as needed for congestion.     tamsulosin 0.4 MG Caps capsule  Commonly known as:  FLOMAX  Take 1 capsule by mouth daily.     albuterol (2.5 MG/3ML) 0.083% nebulizer solution  Commonly known as:  PROVENTIL  Take 2.5 mg by nebulization 4 (four) times daily.     VENTOLIN HFA 108 (90 BASE) MCG/ACT inhaler  Generic  drug:  albuterol  INHALE 2 PUFFS EVERY 6 HOURS AS NEEDED        Follow-up Information   Follow up with PARRETT,TAMMY, NP On 05/07/2014. (Appt at  4 PM @ [facility])    Specialty:  Nurse Practitioner   Contact information:   [place] [address] [phone]       Follow up with Jani Gravel, MD. Schedule an appointment as soon as possible for a visit in 1 week. (Call for appt to be seen for blood pressure review. )    Specialty:  Internal Medicine   Contact information:   [address] [place] [address] [phone]        Disposition:  Home.  Rolling walker, oxygen & BiPAP arranged for discharge.  Discharged Condition: MARTY UY has met maximum benefit of inpatient care and is medically stable and cleared for discharge.  Patient is pending follow up as above.      Time spent on disposition:  Greater than 35 minutes.   Marni Griffon ACNP-BC [place] Pager # [phone] OR # [phone] if no answer     Merton Border, MD ; [facility] [phone].  After 5:30 PM or weekends, call [phone]

## 2014-05-04 NOTE — Progress Notes (Signed)
PULMONARY / CRITICAL CARE MEDICINE   Name: Isaac [name] MRN: [id number] DOB: 1956/09/27    ADMISSION DATE:  04/24/2014  REFERRING MD :  Sharol Given  CHIEF COMPLAINT:  SOB  BRIEF PATIENT DESCRIPTION: 58 y/o smoker with progressive dyspnea from AECOPD.  Intubated in ED. Extubated on 6/02 and doing well with BiPAP HS and venti mask during the day. Transferred to SDU 6/3. On 6/4 he continues to do well with venti mask during the day and hemodynamics remaining stable. Followed by Dr. Chase Caller.  SIGNIFICANT EVENTS / STUDIES: 5/30  Admited 5/30  TTE >>> EF 57%, grade 1 diastolic dysfunction 3/22  MRI brain >>> Mild exophthalmos, paranasal sinus thickening, partial mastoid opacification b/l 6/02  Extubated, doing well on BiPAP 6/03  Transfered to SDU 6/09   No distress on floor.  Reports episodes of SOB with exertion, very anxious about returning home  LINES / TUBES: ETT 5/30 >>> 6/02  CULTURES: 5/30  Sputum >>> neg 5/30  Blood >>> neg  ANTIBIOTICS: Levaquin 5/30 >>> 6/5  INTERVAL HISTORY:  Reports episodes of SOB with exertion, very anxious about returning home.  Continues to c/o sinus drainage  VITAL SIGNS: Temp:  [97.7 F (36.5 C)-98.1 F (36.7 C)] 98.1 F (36.7 C) (06/09 0518) Pulse Rate:  [70-74] 72 (06/09 0518) Resp:  [17-28] 18 (06/09 0518) BP: (112-143)/(60-101) 118/60 mmHg (06/09 0518) SpO2:  [93 %-99 %] 95 % (06/09 0820)  VENTILATOR SETTINGS:    INTAKE / OUTPUT: Intake/Output     06/08 0701 - 06/09 0700 06/09 0701 - 06/10 0700   P.O. 850    Total Intake(mL/kg) 850 (6.8)    Urine (mL/kg/hr) 850 (0.3)    Total Output 850     Net 0          Stool Occurrence 2 x     PHYSICAL EXAMINATION: General: No distress, up to chair Neuro:  Awake, alert, cooperative.  Anxious. HEENT:  No JVD Cardiovascular: Regular, no murmurs Lungs: Bilateral diminished air entry, no adventitious sounds Abdomen:  Soft, bowel sounds present Musculoskeletal: Trace edema Skin:  No  rashes  LABS:  CBC  Recent Labs Lab 05/02/14 0238 05/03/14 0254 05/04/14 0324  WBC 17.2* 15.8* 15.9*  HGB 11.3* 11.2* 10.8*  HCT 37.4* 36.6* 36.2*  PLT 308 303 336   BMET  Recent Labs Lab 05/02/14 0238 05/03/14 0254 05/04/14 0324  NA 142 140 140  K 3.7 3.5* 3.6*  CL 98 97 96  CO2 37* 33* 34*  BUN 25* 21 19  CREATININE 0.92 0.95 0.99  GLUCOSE 115* 101* 94   Electrolytes  Recent Labs Lab 04/29/14 0300  05/02/14 0238 05/03/14 0254 05/04/14 0324  CALCIUM 9.6  < > 8.9 8.8 8.8  MG 2.7*  --   --   --   --   < > = values in this interval not displayed.  ABG  Recent Labs Lab 04/28/14 0432 04/30/14 0555  PHART 7.429 7.396  PCO2ART 62.4* 64.6*  PO2ART 88.9 98.3   Liver Enzymes  Recent Labs Lab 04/29/14 0300 05/02/14 0238  AST 54* 19  ALT 70* 42  ALKPHOS 58 55  BILITOT 0.7 0.5  ALBUMIN 3.7 3.1*   Glucose  Recent Labs Lab 05/02/14 2150 05/03/14 0755 05/03/14 1133 05/03/14 1704 05/03/14 2138 05/04/14 0742  GLUCAP 106* 97 153* 115* 108* 99   IMAGING: No results found.  ASSESSMENT / PLAN:  PULMONARY A:  Acute on chronic respiratory failure AECOPD Probable OSA / OHS No airspace  disease on PCXR 6/6 P:   Goal SpO2>88 Supplemental oxygen BiPAP hs and prn Albuterol / Atrovent / Brovana / Pulmiocort >>will need to decide on home regimen for d/c (nebs vs inhaler) Prednisone 20, needs to complete total of 14 days taper DME orders placed for home BiPAP ( 12/5 ) and liquid oxygen / portable concentrator Polysomnography after d/c Mobilize as able   CARDIOVASCULAR A:  HTN Chronic diastolic heart failure Sinus tachycardia, resolved P:  Bisoprolol, Losartan, HCTZ, Clonidine Needs to record dry weight upon discharge and contact MD / start diuretics if increases   RENAL A:  Hypokalemia P: K 40 QD  GASTROINTESTINAL A:   Nutrition. GERD P:   Diet Protonix Bowel regimen   HEMATOLOGIC A:   Leukocytosis, likely secondary to  systemic steroids VTE Px P:  Trend CBC Heparin   INFECTIOUS A:   AECOPD Sinusitis?  P:   Levaquin completed  ENDOCRINE A:   Steroid induced hyperglycemia P:    SSI  NEUROLOGIC A:   Acute encephalopathy, resolved Anxiety Chronic insomnia  P: Xanax, Zoloft, Trazodone PT/OT Activity as tolerated DME order placed for rolling walker   GLOBAL: Plan for d/c pending attending review.  Patient very anxious.  Afraid he will return if d/c'd today.  Arrange for home PT, RN  Noe Gens, NP-C [facility] Pgr: [phone] or [phone]  PCCM ATTENDING: I have interviewed and examined the patient and reviewed the database. I have formulated the assessment and plan as reflected in the note above with amendments made by me.  He is reluctant to go home. Will work on providing as much support as possible with home health services and anticipate DC home in AM 6/10  Merton Border, MD;  PCCM service; [place] [phone]   05/04/2014, 9:27 AM

## 2014-05-05 LAB — GLUCOSE, CAPILLARY
Glucose-Capillary: 81 mg/dL (ref 70–99)
Glucose-Capillary: 93 mg/dL (ref 70–99)
Glucose-Capillary: 141 mg/dL — ABNORMAL HIGH (ref 70–99)

## 2014-05-05 MED ORDER — PREDNISONE 10 MG PO TABS: ORAL_TABLET | ORAL | Status: DC

## 2014-05-05 MED ORDER — BISOPROLOL FUMARATE 5 MG PO TABS: 5.0000 mg | ORAL_TABLET | Freq: Every day | ORAL | Status: DC

## 2014-05-05 MED ORDER — SALINE SPRAY 0.65 % NA SOLN: 2.0000 | NASAL | Status: DC | PRN

## 2014-05-05 MED ORDER — ALPRAZOLAM 0.25 MG PO TABS: 0.2500 mg | ORAL_TABLET | ORAL | Status: DC | PRN

## 2014-05-05 NOTE — Progress Notes (Signed)
Occupational Therapy Treatment and Discharge Patient Details Name: Isaac [name] MRN: [id number] DOB: [birth year] Today's Date: 05/05/2014    History of present illness Pt admit with COPD exacerbation.   OT comments  All OT goals are met, pt is performing self care at a modified independent level.  Showering with supervision.  Pt is knowledgeable in energy conservation.  All equipment needs are met.  Follow Up Recommendations  Supervision - Intermittent    Equipment Recommendations  None recommended by OT    Recommendations for Other Services      Precautions / Restrictions Precautions Precautions: None Precaution Comments: 2 L 02       Mobility Bed Mobility                  Transfers Overall transfer level: Independent Equipment used: None Transfers: Sit to/from Stand Sit to Stand: Independent (did not use hands)              Balance     Sitting balance-Leahy Scale: Good       Standing balance-Leahy Scale: Good                     ADL Overall ADL's : Modified independent                                     Functional mobility during ADLs: Modified independent (in room and bathroom, slower pace) General ADL Comments: Pt demonstrated ability to perform toileting independently, groom at sink independently, and perform simulated tub transfer at modified independent level. Reinforced breathing techniques, energy conservation, and pacing.  Pt voiced understanding of all. (He reports showering on tub seat with supervision yesterday.)      Vision                     Perception     Praxis      Cognition   Behavior During Therapy: WFL for tasks assessed/performed Overall Cognitive Status: Within Functional Limits for tasks assessed                       Extremity/Trunk Assessment               Exercises     Shoulder Instructions       General Comments      Pertinent Vitals/ Pain       No  pain  Home Living                                          Prior Functioning/Environment              Frequency Min 2X/week     Progress Toward Goals  OT Goals(current goals can now be found in the care plan section)  Progress towards OT goals: Goals met/education completed, patient discharged from OT  Acute Rehab OT Goals Patient Stated Goal: to go home  Plan All goals met and education completed, patient discharged from OT services    Co-evaluation                 End of Session     Activity Tolerance Patient tolerated treatment well   Patient Left in chair;with call bell/phone within reach   Nurse Communication  Time: 0300-9233 OT Time Calculation (min): 27 min  Charges: OT General Charges $OT Visit: 1 Procedure OT Treatments $Self Care/Home Management : 23-37 mins  Malka So 05/05/2014, 10:20 AM [phone]

## 2014-05-05 NOTE — Progress Notes (Signed)
Intern note/chart reviewed. Revisions made.  Maisee Vollman RD, LDN, CNSC [phone] Pager [phone] After Hours Pager  

## 2014-05-05 NOTE — Progress Notes (Signed)
NUTRITION FOLLOW UP  Intervention:    Continue CHO-modified diet to provide nutrition needs  Nutrition Dx:   Inadequate oral intake related to difficulty breathing as evidenced by minimal intake of meals; resolved  Goal:   Intake to meet >90% of estimated nutrition needs; met   Monitor:   PO intake, labs, weight trend  Assessment:   Pt with severe COPD, had progressive dyspnea for 3-4 days and was intubated in ED for acute hypercapnic/hypoxic respiratory failure. Intubated on admission.  Extubated on 6/2. Pt now using nasal cannula.   Pt's intake has improved greatly and is now eating 100% of meals. Pt stated that he has a great appetite and no concerns at this time.   No further interventions warranted at this time  Height: Ht Readings from Last 1 Encounters:  04/24/14 '5\' 11"'  (1.803 m)    Weight Status:   Wt Readings from Last 1 Encounters:  04/29/14 274 lb 14.6 oz (124.7 kg)    Re-estimated needs:  Kcal: 2000-2200 Protein: 115-125 gm Fluid: 2-2.2 L  Skin: no wounds  Diet Order: Carb Control   Intake/Output Summary (Last 24 hours) at 05/05/14 1111 Last data filed at 05/05/14 1015  Gross per 24 hour  Intake   1560 ml  Output   2200 ml  Net   -640 ml    Last BM: 6/9   Labs:   Recent Labs Lab 04/29/14 0300  05/02/14 0238 05/03/14 0254 05/04/14 0324  NA 146  < > 142 140 140  K 5.1  < > 3.7 3.5* 3.6*  CL 97  < > 98 97 96  CO2 35*  < > 37* 33* 34*  BUN 38*  < > 25* 21 19  CREATININE 0.88  < > 0.92 0.95 0.99  CALCIUM 9.6  < > 8.9 8.8 8.8  MG 2.7*  --   --   --   --   GLUCOSE 122*  < > 115* 101* 94  < > = values in this interval not displayed.  CBG (last 3)   Recent Labs  05/04/14 1658 05/04/14 2239 05/05/14 0741  GLUCAP 115* 101* 81    Scheduled Meds: . albuterol  2.5 mg Nebulization Q6H  . arformoterol  15 mcg Nebulization Q12H  . bisoprolol  5 mg Oral Daily  . budesonide (PULMICORT) nebulizer solution  0.25 mg Nebulization Q12H  .  cloNIDine  0.2 mg Oral TID  . docusate sodium  100 mg Oral Daily  . fluticasone  2 spray Each Nare Daily  . Gerhardt's butt cream   Topical BID  . heparin subcutaneous  5,000 Units Subcutaneous 3 times per day  . losartan  50 mg Oral Daily   And  . hydrochlorothiazide  12.5 mg Oral Daily  . insulin aspart  0-20 Units Subcutaneous TID WC  . ipratropium  0.5 mg Nebulization Q6H  . oxymetazoline  2 spray Each Nare BID  . pantoprazole  40 mg Oral Q1200  . polyethylene glycol  17 g Oral Daily  . predniSONE  20 mg Oral Q breakfast  . sertraline  25 mg Oral Daily  . tamsulosin  0.4 mg Oral Daily    Continuous Infusions: None  Carrolyn Leigh, BS Dietetic Intern Pager: [phone]

## 2014-05-07 ENCOUNTER — Encounter: Payer: Self-pay | Admitting: Adult Health

## 2014-05-07 ENCOUNTER — Ambulatory Visit (INDEPENDENT_AMBULATORY_CARE_PROVIDER_SITE_OTHER): Payer: Medicare Other | Admitting: Adult Health

## 2014-05-07 VITALS — BP 124/66 | HR 108 | Temp 97.7°F | Ht 71.0 in | Wt 284.6 lb

## 2014-05-07 DIAGNOSIS — J441 Chronic obstructive pulmonary disease with (acute) exacerbation: Secondary | ICD-10-CM

## 2014-05-07 DIAGNOSIS — G4730 Sleep apnea, unspecified: Secondary | ICD-10-CM

## 2014-05-07 DIAGNOSIS — I5032 Chronic diastolic (congestive) heart failure: Secondary | ICD-10-CM | POA: Diagnosis not present

## 2014-05-07 DIAGNOSIS — J9620 Acute and chronic respiratory failure, unspecified whether with hypoxia or hypercapnia: Secondary | ICD-10-CM

## 2014-05-07 NOTE — Patient Instructions (Addendum)
Continue on current regimen  Add Zyrtec 10mg  At bedtime  As needed  Drainage  Finish prednisone as directed.  Weigh daily in am , keep log and bring to each visit.  Call if weight increases by 3-5 lbs with leg swelling and shortness of breath .  Wear BIPAP At bedtime   Work on weight loss.  Follow up for sleep study as planned next month.  Follow up Dr. Chase Caller in 1 month .  Please contact office for sooner follow up if symptoms do not improve or worsen or seek emergency care

## 2014-05-07 NOTE — Progress Notes (Signed)
Subjective:    Patient ID: Isaac [name], male    DOB: 1956/10/27, 58 y.o.   MRN: [id number]  HPI Primary care physician is Dr. Jani Gravel Cardiologist is Dr. Donalee Citrin.  HPI #Obesity Body mass index is 39.91 kg/(m^2). on 03/26/2013  #Chronic sinusitis T sinus 2006   - IMPRESSION:  1. Ethmoid and frontal sinus dise Case. Sphenoid and maxillary sinuses are clear.  2. Patent ostiomeatal complexes.  #Smoking history  reports that he quit smoking about 2 years ago. His smoking use included Cigarettes. He has a 35 pack-year smoking history. He does not have any smokeless tobacco history on file.  #Multifactorial dyspnea -  Nuclear med stress test  2013 -  - negative per hx. Done by Dr Einar Gip per hx  #COPD - Severe/very severe COPD Spirometry 12/31/11 done at [facility]- fev1 0.8L/21%, Rati 39, 23% BD response on fev1 May 2013: Walk test in office : Pulse 88% at rest -> 1 lap x 185 feet - pulse ox 88% and HR 110 and very dyspneic, so stopped - Status post pulmonary rehabilitation fall 2013   #Evaluation for lung cancer : CXR 12/24/11   Clear lung fields - personally revieweed  #Recurrent COPD - May 2013: Outpatient treatment with doxycycline and prednisone - August 2013: Hospitalization for COPD exacerbation - January 2014: Outpatient treatment with antibiotics and prednisone - March 2015 : telephoine Rx with sinusitis, abx and prednisone - April 2015: telephoine Rx with abx and prednisone   OV 03/26/2013 Followup for COPD and obesity. Presents again with his wife  Isaac [name]. Last visit was January 2014  - In terms of obesity has not lost any weight. In fact gained weight  - In terms of COPD: Couple weeks ago he called in for sinus symptoms and he was given antibiotics and prednisone. We had her do a second round of antibiotics as well. After that sinus infection is resolved. His COPD stable but he has good days and bad days. Today is a bad day. COPD cat score as listed below  and is 32 reflecting high symptom burden. His wife thinks that he needs to attend pulmonary rehabilitation again. He has a lot of excuses from the fact that they have one car to be guilty about burdening his wife.. Wife says he is not motivated. They looked at fitness center at [employer] on [address] and as a trainer willing to work with them but patient feels shy showing up there with his oxygen tank      OV 04/12/2014  Chief Complaint  Patient presents with  . Follow-up    Pt states breathing hasn't been doing well. Pt c/o increase in SOB and work of breathing and c/o chest tightness with exertion and in the mornings. Denies cough.    Follow up severe COPD with bronchodilator response, recc AECOPD, chronic sinusitis. . Ass.obesity +  - I have not seen him since May 2014. Since then overall he has been stable but then in mid March 2015 he called in with worsening left-sided acute sinusitis but was then resulting in worsening COPD and associated with a COPD exacerbation. Over the telephone we treated him with antibiotics and prednisone. But then he had a recurrence of the same thing on 03/19/2014 and again treated with another set of antibiotics and prednisone. Both of these resulted in some improvement of the flareups but he is persistent chronic sinus symptoms especially on the left nostril. He recollects being seen by ENT  Dr Erik Obey for the same and apparently surgery or MRI was recommended but he did not follow through. He wants to see another ENT specialist now.  He is on maximal nasal therapy and he feels his sinuses result in AECPD. In terms of his COPD it this is currently stable . Wife says fall and spring are worst times for him.   - Lab review: noticed periphreal eosinophilia in 2013.    _ I discussed potential participation in [employer] research study due to rrecurrent AECOPD and associated peripheral eosinophilia. He and his wife are interested. I have advised them we  can consider this but will see what his ENT workup results in. They are agreeable  CAT COPD Symptom & Quality of Life Score ([employer]) 0 is no burden. 5 is highest burden 08/20/2012  12/08/2012  03/26/2013   Never Cough -> Cough all the time 2 4 3   No phlegm in chest -> Chest is full of phlegm 2 4 3   No chest tightness -> Chest feels very tight 3 4 4   No dyspnea for 1 flight stairs/hill -> Very dyspneic for 1 flight of stairs 4 4 5   No limitations for ADL at home -> Very limited with ADL at home 3 4 4   Confident leaving home -> Not at all confident leaving home 3 3 4   Sleep soundly -> Do not sleep soundly because of lung condition 3 4 4   Lots of Energy -> No energy at all 4 3 5   TOTAL Score (max 40)  24 30 32    05/07/2014 [facility] follow up  Returns for post hospital follow up .  Admitted 5/20-6/10 for AECOPD/ acute on chronic RF probable OSA/OHS,  He was intubated and transitioned to BIPAP At bedtime  At extubation. Tx w/  IV abx, steroids and nebs. Discharged on steroid taper.  Echo showed  EF 61%, grade 1 diastolic dysfunction   MRI brain >>> Mild exophthalmos, paranasal sinus thickening, partial mastoid opacification b/l   Still has hoarseness and drainage. Nasal stuffiness . Sore throat on/off .  Since discharge he is feeling better. Still feeling weak,low energy.  Discharge on BIPAP , requests humidifier.  Wearing 4-5 hr each night.  Has OP sleep study set up for next month.  Remains on O2 2l/m rest , 4l/m with walking On pulmicort Neb Twice daily   /duoneb Four times a day   Did not start prednisone until today .  Less leg swelling,. Reminded to weight daily at home in am.     Review of Systems  Constitutional:   No  weight loss, night sweats,  Fevers, chills,  +fatigue, or  lassitude.  HEENT:   No headaches,  Difficulty swallowing,  Tooth/dental problems, or  Sore throat,                No sneezing, itching, ear ache, nasal congestion, post nasal drip,   CV:   No chest pain,  Orthopnea, PND,  anasarca, dizziness, palpitations, syncope.   GI  No heartburn, indigestion, abdominal pain, nausea, vomiting, diarrhea, change in bowel habits, loss of appetite, bloody stools.   Resp:   No excess mucus, no productive cough,  No non-productive cough,  No coughing up of blood.  No change in color of mucus.  No wheezing.  No chest wall deformity  Skin: no rash or lesions.  GU: no dysuria, change in color of urine, no urgency or frequency.  No flank pain, no hematuria  MS:  No joint pain or swelling.  No decreased range of motion.  No back pain.  Psych:  No change in mood or affect. No depression or anxiety.  No memory loss.          Objective:   Physical Exam  GEN: A/Ox3; pleasant , NAD, obese  HEENT:  [place]/AT,  EACs-clear, TMs-wnl, NOSE-clear, THROAT-clear, no lesions, no postnasal drip or exudate noted.   NECK:  Supple w/ fair ROM; no JVD; normal carotid impulses w/o bruits; no thyromegaly or nodules palpated; no lymphadenopathy.  RESP  Diminished BS in bases no accessory muscle use, no dullness to percussion  CARD:  RRR, no m/r/g  , tr-1+  peripheral edema, pulses intact, no cyanosis or clubbing.  GI:   Soft & nt; nml bowel sounds; no organomegaly or masses detected.  Musco: Warm bil, no deformities or joint swelling noted.   Neuro: alert, no focal deficits noted.    Skin: Warm, no lesions or rashes         Assessment & Plan:

## 2014-05-11 DIAGNOSIS — I5032 Chronic diastolic (congestive) heart failure: Secondary | ICD-10-CM | POA: Insufficient documentation

## 2014-05-11 NOTE — Assessment & Plan Note (Signed)
VDRF in setting of AECOPD , and probable OSA/OHS and diastolic HF >resolved   Plan  Cont on Oxygen.  Sleep study pending  Cont w/ BIPAP At bedtime

## 2014-05-11 NOTE — Addendum Note (Signed)
Addended by: Parke Poisson E on: 05/11/2014 02:31 PM   Modules accepted: Orders

## 2014-05-11 NOTE — Assessment & Plan Note (Signed)
Weigh daily in am , keep log and bring to each visit.  Call if weight increases by 3-5 lbs with leg swelling and shortness of breath .  Wear BIPAP At bedtime   Work on weight loss.  Follow up for sleep study as planned next month.  Follow up Dr. Chase Caller in 1 month .  Please contact office for sooner follow up if symptoms do not improve or worsen or seek emergency care

## 2014-05-11 NOTE — Assessment & Plan Note (Signed)
Wear BIPAP At bedtime   Work on weight loss.  Follow up for sleep study as planned next month.  Follow up Dr. Chase Caller in 1 month .  Please contact office for sooner follow up if symptoms do not improve or worsen or seek emergency care

## 2014-05-11 NOTE — Assessment & Plan Note (Signed)
Flare -now resolving   Plan  Continue on current regimen  Add Zyrtec 10mg  At bedtime  As needed  Drainage  Finish prednisone as directed.  Follow up Dr. Chase Caller in 1 month .  Please contact office for sooner follow up if symptoms do not improve or worsen or seek emergency care

## 2014-05-12 ENCOUNTER — Telehealth: Payer: Self-pay | Admitting: Internal Medicine

## 2014-05-12 NOTE — Telephone Encounter (Signed)
Pt aware order was sent to [facility] yesterday. I advised them they would get in touch with him as soon as they can. Nothing further needed

## 2014-05-26 ENCOUNTER — Telehealth: Payer: Self-pay | Admitting: Internal Medicine

## 2014-05-26 NOTE — Telephone Encounter (Signed)
Melissa from [facility] called and stated that the pt is still using the hospital cpap that he was sent home with and they are not able to set up pt with humidifier until after the pt has the sleep study.  Pt is scheduled for the following.  06/06/14   Sleep study  06/08/14  PFT and appt with MR.    FYI for MR.

## 2014-06-06 ENCOUNTER — Ambulatory Visit (HOSPITAL_BASED_OUTPATIENT_CLINIC_OR_DEPARTMENT_OTHER): Payer: 59 | Attending: Pulmonary Disease

## 2014-06-06 VITALS — Ht >= 80 in | Wt 280.0 lb

## 2014-06-06 DIAGNOSIS — J4489 Other specified chronic obstructive pulmonary disease: Secondary | ICD-10-CM | POA: Insufficient documentation

## 2014-06-06 DIAGNOSIS — R0609 Other forms of dyspnea: Secondary | ICD-10-CM | POA: Insufficient documentation

## 2014-06-06 DIAGNOSIS — Z79899 Other long term (current) drug therapy: Secondary | ICD-10-CM | POA: Insufficient documentation

## 2014-06-06 DIAGNOSIS — I498 Other specified cardiac arrhythmias: Secondary | ICD-10-CM | POA: Insufficient documentation

## 2014-06-06 DIAGNOSIS — I4949 Other premature depolarization: Secondary | ICD-10-CM | POA: Insufficient documentation

## 2014-06-06 DIAGNOSIS — G4733 Obstructive sleep apnea (adult) (pediatric): Secondary | ICD-10-CM | POA: Insufficient documentation

## 2014-06-06 DIAGNOSIS — R0989 Other specified symptoms and signs involving the circulatory and respiratory systems: Secondary | ICD-10-CM | POA: Insufficient documentation

## 2014-06-06 DIAGNOSIS — J449 Chronic obstructive pulmonary disease, unspecified: Secondary | ICD-10-CM | POA: Insufficient documentation

## 2014-06-06 DIAGNOSIS — G4730 Sleep apnea, unspecified: Secondary | ICD-10-CM

## 2014-06-06 DIAGNOSIS — G4761 Periodic limb movement disorder: Secondary | ICD-10-CM | POA: Insufficient documentation

## 2014-06-08 ENCOUNTER — Telehealth: Payer: Self-pay | Admitting: Pulmonary Disease

## 2014-06-08 ENCOUNTER — Ambulatory Visit (INDEPENDENT_AMBULATORY_CARE_PROVIDER_SITE_OTHER): Payer: 59 | Admitting: Internal Medicine

## 2014-06-08 ENCOUNTER — Ambulatory Visit: Payer: 59 | Admitting: Internal Medicine

## 2014-06-08 ENCOUNTER — Other Ambulatory Visit (INDEPENDENT_AMBULATORY_CARE_PROVIDER_SITE_OTHER): Payer: 59

## 2014-06-08 ENCOUNTER — Encounter: Payer: Self-pay | Admitting: Internal Medicine

## 2014-06-08 VITALS — BP 118/70 | HR 64 | Ht 70.5 in | Wt 223.0 lb

## 2014-06-08 DIAGNOSIS — J449 Chronic obstructive pulmonary disease, unspecified: Secondary | ICD-10-CM

## 2014-06-08 DIAGNOSIS — D649 Anemia, unspecified: Secondary | ICD-10-CM

## 2014-06-08 DIAGNOSIS — I671 Cerebral aneurysm, nonruptured: Secondary | ICD-10-CM

## 2014-06-08 DIAGNOSIS — D62 Acute posthemorrhagic anemia: Secondary | ICD-10-CM

## 2014-06-08 DIAGNOSIS — G4730 Sleep apnea, unspecified: Secondary | ICD-10-CM

## 2014-06-08 DIAGNOSIS — J9612 Chronic respiratory failure with hypercapnia: Secondary | ICD-10-CM | POA: Insufficient documentation

## 2014-06-08 DIAGNOSIS — J9610 Chronic respiratory failure, unspecified whether with hypoxia or hypercapnia: Secondary | ICD-10-CM

## 2014-06-08 DIAGNOSIS — I5032 Chronic diastolic (congestive) heart failure: Secondary | ICD-10-CM

## 2014-06-08 LAB — PULMONARY FUNCTION TEST
DL/VA % pred: 47 %
DL/VA: 2.21 ml/min/mmHg/L
DLCO unc % pred: 26 %
DLCO unc: 8.83 ml/min/mmHg
FEF 25-75 Post: 0.4 L/sec
FEF 25-75 Pre: 0.33 L/sec
FEF2575-%Change-Post: 23 %
FEF2575-%Pred-Post: 13 %
FEF2575-%Pred-Pre: 10 %
FEV1-%Change-Post: 9 %
FEV1-%Pred-Post: 24 %
FEV1-%Pred-Pre: 22 %
FEV1-Post: 0.9 L
FEV1-Pre: 0.83 L
FEV1FVC-%Change-Post: 5 %
FEV1FVC-%Pred-Pre: 51 %
FEV6-%Change-Post: 6 %
FEV6-%Pred-Post: 44 %
FEV6-%Pred-Pre: 41 %
FEV6-Post: 2.08 L
FEV6-Pre: 1.96 L
FEV6FVC-%Change-Post: 3 %
FEV6FVC-%Pred-Post: 100 %
FEV6FVC-%Pred-Pre: 97 %
FVC-%Change-Post: 3 %
FVC-%Pred-Post: 44 %
FVC-%Pred-Pre: 42 %
FVC-Post: 2.17 L
FVC-Pre: 2.09 L
Post FEV1/FVC ratio: 42 %
Post FEV6/FVC ratio: 96 %
Pre FEV1/FVC ratio: 39 %
Pre FEV6/FVC Ratio: 93 %

## 2014-06-08 LAB — CBC WITH DIFFERENTIAL/PLATELET
Basophils Absolute: 0 10*3/uL (ref 0.0–0.1)
Basophils Relative: 0.4 % (ref 0.0–3.0)
Eosinophils Absolute: 0.3 10*3/uL (ref 0.0–0.7)
Eosinophils Relative: 4.2 % (ref 0.0–5.0)
HCT: 35.4 % — ABNORMAL LOW (ref 39.0–52.0)
Hemoglobin: 11.4 g/dL — ABNORMAL LOW (ref 13.0–17.0)
Lymphocytes Relative: 30.7 % (ref 12.0–46.0)
Lymphs Abs: 2.6 10*3/uL (ref 0.7–4.0)
MCHC: 32.2 g/dL (ref 30.0–36.0)
MCV: 81.3 fl (ref 78.0–100.0)
Monocytes Absolute: 0.9 10*3/uL (ref 0.1–1.0)
Monocytes Relative: 10.5 % (ref 3.0–12.0)
Neutro Abs: 4.6 10*3/uL (ref 1.4–7.7)
Neutrophils Relative %: 54.2 % (ref 43.0–77.0)
Platelets: 458 10*3/uL — ABNORMAL HIGH (ref 150.0–400.0)
RBC: 4.35 Mil/uL (ref 4.22–5.81)
RDW: 15.7 % — ABNORMAL HIGH (ref 11.5–15.5)
WBC: 8.4 10*3/uL (ref 4.0–10.5)

## 2014-06-08 NOTE — Addendum Note (Signed)
Addended by: [name] A on: 06/08/2014 11:48 AM   Modules accepted: Orders

## 2014-06-08 NOTE — Telephone Encounter (Signed)
Refer to Dr Halford Chessman. I am heading on vacation right now. DR Halford Chessman knows him well from ICU and I told him and wife at dc to get appt with Dr Halford Chessman   Dr. Brand Males, M.D., [facility].C.P Pulmonary and Critical Care Medicine Staff Physician [facility] Pager: [phone], If no answer or between  15:00h - 7:00h: call [phone]  06/08/2014 1:39 PM

## 2014-06-08 NOTE — Progress Notes (Signed)
Subjective:    Patient ID: Isaac [name], male    DOB: [birth year], 58 y.o.   MRN: [id number]  HPI Primary care physician is Dr. Jani Gravel Cardiologist is Dr. Donalee Citrin.  HPI #Obesity Body mass index is 39.91 kg/(m^2). on 03/26/2013  #Chronic sinusitis T sinus 2006   - IMPRESSION:  1. Ethmoid and frontal sinus dise Case. Sphenoid and maxillary sinuses are clear.  2. Patent ostiomeatal complexes.  #Smoking history  reports that he quit smoking about 2 years ago. His smoking use included Cigarettes. He has a 35 pack-year smoking history. He does not have any smokeless tobacco history on file.  #Multifactorial dyspnea -  Nuclear med stress test  2013 -  - negative per hx. Done by Dr Einar Gip per hx  #COPD - Severe/very severe COPD Spirometry 12/31/11 done at [facility]- fev1 0.8L/21%, Rati 39, 23% BD response on fev1 May 2013: Walk test in office : Pulse 88% at rest -> 1 lap x 185 feet - pulse ox 88% and HR 110 and very dyspneic, so stopped - Status post pulmonary rehabilitation fall 2013   #Evaluation for lung cancer : CXR 12/24/11   Clear lung fields - personally revieweed  #Recurrent COPD - May 2013: Outpatient treatment with doxycycline and prednisone - August 2013: Hospitalization for COPD exacerbation - January 2014: Outpatient treatment with antibiotics and prednisone - March 2015 : telephoine Rx with sinusitis, abx and prednisone - April 2015: telephoine Rx with abx and prednisone - June 2014 - VDRF with admission to ICU.        OV 06/08/2014  Chief Complaint  Patient presents with  . Follow-up    Pt states since using his neb meds his breathing has improved.C/o DOE. Denies CP/tightness and cough.    Followup from recent ICU hospitalization for respiratory failure. At this point in time he is significantly improved. He used to be bothered by significant sinus blockage for which he had an extensive workup including MRI and MRA but apparently after he started using  nebulizers instead of inhalers this symptom has resolved. He still physically deconditioned. He did have home physical therapy but he found him down an operative go self-directed exercises but after my counseling today he is open to reinitiating home physical therapy. This is because he still physically deconditioned. He uses oxygen and nebulizers on a compliant fashion. PFTs today show significant severe Gold stage IV lung disease with FEV1 of 0.92/24% and a ratio of 42. Total lung capacity of 122% and a DLCO that significantly impaired at 8.8/26%.  Labs reviewed June 2040 in the hospital at discharge he was anemic with a hemoglobin of 10 g percent. This has not been followed up. His chemistries were normal.  Past medical history: He did have a sleep study over the weekend. I do not know the results. This was ordered by Dr. Halford Chessman and I will set up followup for this to Dr. Halford Chessman    Review of Systems  Constitutional: Negative for fever and unexpected weight change.  HENT: Negative for congestion, dental problem, ear pain, nosebleeds, postnasal drip, rhinorrhea, sinus pressure, sneezing, sore throat and trouble swallowing.   Eyes: Negative for redness and itching.  Respiratory: Positive for shortness of breath. Negative for cough, chest tightness and wheezing.   Cardiovascular: Negative for palpitations and leg swelling.  Gastrointestinal: Negative for nausea and vomiting.  Genitourinary: Negative for dysuria.  Musculoskeletal: Negative for joint swelling.  Skin: Negative for rash.  Neurological: Negative for headaches.  Hematological: Does not bruise/bleed easily.  Psychiatric/Behavioral: Negative for dysphoric mood. The patient is not nervous/anxious.        Objective:   Physical Exam  Nursing note and vitals reviewed. Constitutional: He is oriented to person, place, and time. He appears well-developed and well-nourished. No distress.  Obese Sitting in wheel chair Looks deconditoned but  apparnetly better  HENT:  Head: Normocephalic and atraumatic.  Right Ear: External ear normal.  Left Ear: External ear normal.  Mouth/Throat: Oropharynx is clear and moist. No oropharyngeal exudate.  Eyes: Conjunctivae and EOM are normal. Pupils are equal, round, and reactive to light. Right eye exhibits no discharge. Left eye exhibits no discharge. No scleral icterus.  Neck: Normal range of motion. Neck supple. No JVD present. No tracheal deviation present. No thyromegaly present.  Cardiovascular: Normal rate, regular rhythm and intact distal pulses.  Exam reveals no gallop and no friction rub.   No murmur heard. Pulmonary/Chest: Effort normal and breath sounds normal. No respiratory distress. He has no wheezes. He has no rales. He exhibits no tenderness.  Abdominal: Soft. Bowel sounds are normal. He exhibits no distension and no mass. There is no tenderness. There is no rebound and no guarding.  Musculoskeletal: Normal range of motion. He exhibits no edema and no tenderness.  Lymphadenopathy:    He has no cervical adenopathy.  Neurological: He is alert and oriented to person, place, and time. He has normal reflexes. No cranial nerve deficit. Coordination normal.  Skin: Skin is warm and dry. No rash noted. He is not diaphoretic. No erythema. No pallor.  Psychiatric: He has a normal mood and affect. His behavior is normal. Judgment and thought content normal.    Filed Vitals:   06/08/14 1126  BP: 118/70  Pulse: 64  Height: 5' 10.5" (1.791 m)  Weight: 223 lb (101.152 kg)  SpO2: 93%          Assessment & Plan:  #chronic respiratory failure  - glad you are doing much better ; breathing tests show severe impairment in lung function - continue o2 and other nebulizers - still physically deconditioned: refer home PT - mild anemia from ICU stay: check CBC with dff - likely sleep apnea: refer Dr Halford Chessman asap to review sleep study results   #Followup Refer Dr Halford Chessman for sleep study 8  weeks with me

## 2014-06-08 NOTE — Telephone Encounter (Signed)
Pt had sleep study 06/06/14. Please advise MR thanks

## 2014-06-08 NOTE — Telephone Encounter (Signed)
Please advise Dr. Sood thanks 

## 2014-06-08 NOTE — Patient Instructions (Addendum)
#  chronic respiratory failure  - glad you are doing much better ; breathing tests show severe impairment in lung function - continue o2 and other nebulizers - still physically deconditioned: refer home PT - mild anemia from ICU stay: check CBC with dff - likely sleep apnea: refer Dr Halford Chessman asap to review sleep study results   #Followup Refer Dr Halford Chessman for sleep study 8 weeks with me

## 2014-06-08 NOTE — Progress Notes (Signed)
PFT done today. 

## 2014-06-09 ENCOUNTER — Telehealth: Payer: Self-pay | Admitting: Internal Medicine

## 2014-06-09 NOTE — Telephone Encounter (Signed)
I called spoke with Waunita Schooner. He reports this will be placed in VS box to read.  I advised will make Dr. Halford Chessman aware of this.

## 2014-06-09 NOTE — Telephone Encounter (Signed)
Can you find out who the reading physician is for pt's sleep study.  Not in my inbox in Epic.  Likely to be read by Dr. Gwenette Greet.

## 2014-06-09 NOTE — Telephone Encounter (Signed)
Called and spoke to [place]. She stated she was calling to inform MR that the pt's PT will not be able to start until 2 weeks from now d/t the pt being out of town.   MR-just as an Pharmacist, hospital

## 2014-06-09 NOTE — Telephone Encounter (Signed)
Noted  

## 2014-06-09 NOTE — Telephone Encounter (Signed)
Called spoke with Waunita Schooner over at sleep lab. He is going to look into this and call me back

## 2014-06-10 DIAGNOSIS — G4710 Hypersomnia, unspecified: Secondary | ICD-10-CM | POA: Diagnosis not present

## 2014-06-10 DIAGNOSIS — G4730 Sleep apnea, unspecified: Secondary | ICD-10-CM

## 2014-06-10 NOTE — Sleep Study (Signed)
[place]  NAME: Isaac [name] OF BIRTH:  1956/11/12 MEDICAL RECORD NUMBER [id number]  LOCATION: [facility]  PHYSICIAN: Chesley Mires, M.D. DATE OF STUDY: 06/06/2014  SLEEP STUDY TYPE: Polysomnogram               REFERRING PHYSICIAN: Zubelevitskiy, Konstant*  INDICATION FOR STUDY:  Isaac [name] is a 58 y.o. male who presents to the sleep lab for evaluation of hypersomnia with obstructive sleep apnea.  He reports snoring, sleep disruption, apnea, and daytime sleepiness.  He has a history of severe COPD on home oxygen.  EPWORTH SLEEPINESS SCORE: 2. HEIGHT: 6\' 11"  (210.8 cm)  WEIGHT: 280 lb (127.007 kg)    Body mass index is 28.58 kg/(m^2).  NECK SIZE: 17 in.  MEDICATIONS:  Current Outpatient Prescriptions on File Prior to Visit  Medication Sig Dispense Refill  . albuterol (PROVENTIL) (2.5 MG/3ML) 0.083% nebulizer solution Take 2.5 mg by nebulization 4 (four) times daily.      Marland Kitchen ALPRAZolam (XANAX) 0.25 MG tablet Take 1 tablet (0.25 mg total) by mouth every 4 (four) hours as needed for anxiety.  30 tablet  0  . azelastine (ASTELIN) 0.1 % nasal spray Place 1 spray into both nostrils daily as needed for allergies.       . bisoprolol (ZEBETA) 5 MG tablet Take 1 tablet (5 mg total) by mouth daily.  30 tablet  3  . budesonide (PULMICORT) 0.5 MG/2ML nebulizer solution Take 0.5 mg by nebulization 2 (two) times daily.      . cloNIDine (CATAPRES) 0.2 MG tablet Take 0.2 mg by mouth 3 (three) times daily.      . fluticasone (FLONASE) 50 MCG/ACT nasal spray Place 2 sprays into both nostrils daily.      Marland Kitchen ipratropium (ATROVENT) 0.02 % nebulizer solution Take 0.5 mg by nebulization 4 (four) times daily.      Marland Kitchen losartan-hydrochlorothiazide (HYZAAR) 50-12.5 MG per tablet Take 1 tablet by mouth daily.      . montelukast (SINGULAIR) 10 MG tablet Take 10 mg by mouth at bedtime.      Marland Kitchen omeprazole (PRILOSEC) 20 MG capsule Take 2 capsules (40 mg total) by  mouth daily. For GERD.  30 capsule  6  . sertraline (ZOLOFT) 25 MG tablet Take 1 tablet by mouth daily.      . sodium chloride (OCEAN) 0.65 % SOLN nasal spray Place 2 sprays into both nostrils as needed for congestion.    0  . tamsulosin (FLOMAX) 0.4 MG CAPS capsule Take 1 capsule by mouth daily.      . VENTOLIN HFA 108 (90 BASE) MCG/ACT inhaler INHALE 2 PUFFS EVERY 6 HOURS AS NEEDED  18 g  0   No current facility-administered medications on file prior to visit.    SLEEP ARCHITECTURE:  Total recording time: 382 minutes.  Total sleep time was: 191.5 minutes.  Sleep efficiency: 50.1%.  Sleep latency: 36.5 minutes.  REM latency: 321 minutes.  Stage N1: 1.6%.  Stage N2: 75.5%.  Stage N3: 10.4%.  Stage R:  12.5%.  Supine sleep: 112 minutes.  Non-supine sleep: 79.5 minutes.  CARDIAC DATA:  Average heart rate: 52 beats per minute. Rhythm strip: sinus rhythm with sinus arrhythmia and occasional PVC's.  RESPIRATORY DATA: Average respiratory rate: 16. Snoring: moderate. Average AHI: 3.1.   Apnea index: 2.8.  Hypopnea index: 0.3. Obstructive apnea index: 2.5.  Central apnea index: 0.3.  Mixed apnea index: 0. REM AHI: 22.5.  NREM  AHI: 0.4. Supine AHI: 0.2. Non-supine AHI: 5.3.  MOVEMENT/PARASOMNIA:  Periodic limb movement: 0.  Period limb movements with arousals: 0. Restroom trips: none.  OXYGEN DATA:  Baseline oxygenation: 94%. Lowest SaO2: 91%. Time spent below SaO2 90%: 0 minutes. Supplemental oxygen used: study was done with patient using 2 liters oxygen.  IMPRESSION/ RECOMMENDATION:   While the patient had a few apneic respiratory events the severity was not sufficient enough to qualify for diagnosis of obstructive sleep apnea.  His oxygenation was well maintained on 2 liters supplemental oxygen.   Chesley Mires, M.D. Diplomate, Tax adviser of Sleep Medicine  ELECTRONICALLY SIGNED ON:  06/10/2014, 10:54 AM [place] PH: [phone]   FX: [phone]  [phone] [place]

## 2014-06-10 NOTE — Telephone Encounter (Signed)
Called and spoke to pt. Informed pt of recs and results per VS. Pt verbalized understanding and denied any other questions or concerns at this time. Will forward to MR as FYI.

## 2014-06-10 NOTE — Telephone Encounter (Signed)
PSG 06/06/14 >> AHI 3.1, SpO2 low 91%.  Study done with pt using 2 liters oxygen.  Will have Dr. Golden Pop nurse inform pt that sleep study was negative for sleep apnea.  He did with his oxygen while using 2 liters oxygen.  He can follow up with Dr. Chase Caller for further management of his COPD and chronic hypoxic respiratory failure.

## 2014-06-20 ENCOUNTER — Ambulatory Visit (HOSPITAL_BASED_OUTPATIENT_CLINIC_OR_DEPARTMENT_OTHER): Payer: 59

## 2014-06-28 NOTE — Assessment & Plan Note (Signed)
#  chronic respiratory failure  - glad you are doing much better ; breathing tests show severe impairment in lung function - continue o2 and other nebulizers - still physically deconditioned: refer home PT - mild anemia from ICU stay: check CBC with dff - likely sleep apnea: refer Dr Halford Chessman asap to review sleep study results   #Followup Refer Dr Halford Chessman for sleep study 8 weeks with me

## 2014-06-30 NOTE — Progress Notes (Signed)
Quick Note:  Called and spoke to pt. Informed pt of results and recs per MR. Pt aware to seek emergency care if symptoms worsen or has increase SOB. Pt verbalized understanding and denied any further questions or concerns at this time. ______

## 2014-08-03 ENCOUNTER — Ambulatory Visit: Payer: Medicare Other | Admitting: Internal Medicine

## 2014-08-30 ENCOUNTER — Telehealth: Payer: Self-pay | Admitting: Pulmonary Disease

## 2014-08-30 NOTE — Telephone Encounter (Signed)
MR pt. Bisoprolol was refilled by Georgann Housekeeper NP 04/2014. Refill request needs to be sent to pt PCP.  \called made [facility] aware. Nothing further needed

## 2014-12-01 ENCOUNTER — Telehealth: Payer: Self-pay | Admitting: Internal Medicine

## 2014-12-01 MED ORDER — ALBUTEROL SULFATE (2.5 MG/3ML) 0.083% IN NEBU: 2.5000 mg | INHALATION_SOLUTION | Freq: Four times a day (QID) | RESPIRATORY_TRACT | Status: DC

## 2014-12-01 NOTE — Telephone Encounter (Signed)
lmomtcb x1 for germaine to call back.

## 2014-12-01 NOTE — Telephone Encounter (Signed)
Spoke with pt's wife. They are needing albuterol nebs refilled to [facility]. These have been sent in. Nothing further is needed.

## 2015-02-02 ENCOUNTER — Telehealth: Payer: Self-pay | Admitting: Internal Medicine

## 2015-02-02 MED ORDER — CEFDINIR 300 MG PO CAPS: 300.0000 mg | ORAL_CAPSULE | Freq: Two times a day (BID) | ORAL | Status: DC

## 2015-02-02 NOTE — Telephone Encounter (Signed)
Ok to call in [place] 300mg , 2 each am for 5 days.

## 2015-02-02 NOTE — Telephone Encounter (Signed)
Patient notified. Rx sent to pharmacy. Nothing further needed.  

## 2015-02-02 NOTE — Telephone Encounter (Signed)
Spoke with pt. Reports cough, sinus pressure, slight SOB and wheezing. Mucus is yellow. Denies chest tightness or fever. Onset was 1 week ago. Would like something called in.  [facility] - please advise as MR is on vacation. Thanks.

## 2015-03-11 ENCOUNTER — Telehealth: Payer: Self-pay | Admitting: Internal Medicine

## 2015-03-11 MED ORDER — ALBUTEROL SULFATE (2.5 MG/3ML) 0.083% IN NEBU: 2.5000 mg | INHALATION_SOLUTION | Freq: Four times a day (QID) | RESPIRATORY_TRACT | Status: DC

## 2015-03-11 NOTE — Telephone Encounter (Signed)
Rx has been sent in. Advised pt's wife that pt needs ROV. This has been scheduled for 04/12/15 at 3:30pm. Nothing further was needed.

## 2015-03-11 NOTE — Telephone Encounter (Signed)
lmtcb x1 for pt's wife. 

## 2015-03-11 NOTE — Telephone Encounter (Signed)
Wife returning call.Isaac [name]

## 2015-04-12 ENCOUNTER — Ambulatory Visit: Payer: Medicare Other | Admitting: Internal Medicine

## 2015-05-11 ENCOUNTER — Telehealth: Payer: Self-pay | Admitting: Internal Medicine

## 2015-05-11 MED ORDER — ALBUTEROL SULFATE (2.5 MG/3ML) 0.083% IN NEBU: 2.5000 mg | INHALATION_SOLUTION | Freq: Four times a day (QID) | RESPIRATORY_TRACT | Status: DC

## 2015-05-11 NOTE — Telephone Encounter (Signed)
Al[facility]. Atrovent had been sent by PCP. Nothing further needed at this time.

## 2015-05-17 ENCOUNTER — Other Ambulatory Visit: Payer: Self-pay | Admitting: Internal Medicine

## 2015-05-17 NOTE — Telephone Encounter (Signed)
Sandy with [facility] called, please call pharmacy at [phone] needs clarificiation with 48 hrs for albuterol RX.

## 2015-05-18 ENCOUNTER — Telehealth: Payer: Self-pay | Admitting: Internal Medicine

## 2015-05-18 NOTE — Telephone Encounter (Signed)
Requesting confirmation on Albuterol. Gave instructions as prescribed. Nothing further needed.

## 2015-06-01 ENCOUNTER — Ambulatory Visit: Payer: Medicare Other | Admitting: Internal Medicine

## 2015-07-21 ENCOUNTER — Telehealth: Payer: Self-pay | Admitting: Internal Medicine

## 2015-07-21 MED ORDER — IPRATROPIUM BROMIDE 0.02 % IN SOLN: 0.5000 mg | Freq: Four times a day (QID) | RESPIRATORY_TRACT | Status: DC

## 2015-07-21 NOTE — Telephone Encounter (Signed)
Called spoke with Germaine. Pt needs refill on atrovent neb. I have sent this in and appt scheduled for pt to see MR. Nothing further needed

## 2015-08-11 ENCOUNTER — Other Ambulatory Visit: Payer: Self-pay | Admitting: Internal Medicine

## 2015-08-22 ENCOUNTER — Encounter: Payer: Self-pay | Admitting: Internal Medicine

## 2015-08-22 ENCOUNTER — Ambulatory Visit (INDEPENDENT_AMBULATORY_CARE_PROVIDER_SITE_OTHER): Payer: 59 | Admitting: Internal Medicine

## 2015-08-22 VITALS — BP 118/82 | HR 79 | Ht 70.0 in | Wt 283.0 lb

## 2015-08-22 DIAGNOSIS — J449 Chronic obstructive pulmonary disease, unspecified: Secondary | ICD-10-CM

## 2015-08-22 DIAGNOSIS — Z23 Encounter for immunization: Secondary | ICD-10-CM | POA: Diagnosis not present

## 2015-08-22 DIAGNOSIS — J9612 Chronic respiratory failure with hypercapnia: Secondary | ICD-10-CM | POA: Diagnosis not present

## 2015-08-22 MED ORDER — ALBUTEROL SULFATE (2.5 MG/3ML) 0.083% IN NEBU: 2.5000 mg | INHALATION_SOLUTION | Freq: Four times a day (QID) | RESPIRATORY_TRACT | Status: DC

## 2015-08-22 MED ORDER — BUDESONIDE 0.5 MG/2ML IN SUSP: 0.5000 mg | Freq: Two times a day (BID) | RESPIRATORY_TRACT | Status: DC

## 2015-08-22 MED ORDER — IPRATROPIUM BROMIDE 0.02 % IN SOLN: RESPIRATORY_TRACT | Status: DC

## 2015-08-22 NOTE — Progress Notes (Signed)
Subjective:    Patient ID: Isaac [name], male    DOB: 1956-08-14, 59 y.o.   MRN: [id number]  HPI   #Obesity Body mass index is 39.91 kg/(m^2). on 03/26/2013  #Chronic sinusitis T sinus 2006   - IMPRESSION:  1. Ethmoid and frontal sinus dise Case. Sphenoid and maxillary sinuses are clear.  2. Patent ostiomeatal complexes.  #Smoking history  reports that he quit smoking about 2 years ago. His smoking use included Cigarettes. He has a 35 pack-year smoking history. He does not have any smokeless tobacco history on file.  #Multifactorial dyspnea -  Nuclear med stress test  2013 -  - negative per hx. Done by Dr Einar Gip per hx  #COPD - Severe/very severe COPD Spirometry 12/31/11 done at [facility]- fev1 0.8L/21%, Rati 39, 23% BD response on fev1 May 2013: Walk test in office : Pulse 88% at rest -> 1 lap x 185 feet - pulse ox 88% and HR 110 and very dyspneic, so stopped - Status post pulmonary rehabilitation fall 2013   #Evaluation for lung cancer : CXR 12/24/11   Clear lung fields - personally revieweed  #Recurrent COPD - May 2013: Outpatient treatment with doxycycline and prednisone - August 2013: Hospitalization for COPD exacerbation - January 2014: Outpatient treatment with antibiotics and prednisone - March 2015 : telephoine Rx with sinusitis, abx and prednisone - April 2015: telephoine Rx with abx and prednisone - June 2014 - VDRF with admission to ICU.        OV 06/08/2014  Chief Complaint  Patient presents with  . Follow-up    Pt states since using his neb meds his breathing has improved.C/o DOE. Denies CP/tightness and cough.    Followup from recent ICU hospitalization for respiratory failure. At this point in time he is significantly improved. He used to be bothered by significant sinus blockage for which he had an extensive workup including MRI and MRA but apparently after he started using nebulizers instead of inhalers this symptom has resolved. He still  physically deconditioned. He did have home physical therapy but he found him down an operative go self-directed exercises but after my counseling today he is open to reinitiating home physical therapy. This is because he still physically deconditioned. He uses oxygen and nebulizers on a compliant fashion. PFTs today show significant severe Gold stage IV lung disease with FEV1 of 0.92/24% and a ratio of 42. Total lung capacity of 122% and a DLCO that significantly impaired at 8.8/26%.  Labs reviewed June 2040 in the hospital at discharge he was anemic with a hemoglobin of 10 g percent. This has not been followed up. His chemistries were normal.  Past medical history: He did have a sleep study over the weekend. I do not know the results. This was ordered by Dr. Halford Chessman and I will set up followup for this to Dr. Jaclyn Prime 08/22/2015  Chief Complaint  Patient presents with  . Follow-up    Pt states his breathing was worse over the summer because of the heat and humidity. Pt c/o night time prod cough with white mucus, right ear pressure, intermittent sinus pressure. Pt denies CP/tightness.    Follow-up chronic respiratory failure with hypoxemia and hypercapnia associated with Gold stage IV COPD and morbid obesity.  In early 2015 he had life-threatening hospitalization from respiratory failure. Last visit in the office was July 2015. After that he failed to follow-up. He now presents with his wife. They want refills of his nebulizers and  wants to up-to-date himself with the vaccines. The last 1 year they deny any hospitalizations or emergency room visits or urgent care visits or new medical diagnoses. He is mostly sedentary and is house sitting but he does do limited activities of daily living such as changing clothes and self-feed. He did travel to the beach but he stayed in the hotel in the beach. Currently he does have some mild baseline cough with yellow sputum but this is unchanged compared to baseline.  There are no new issues.   Current outpatient prescriptions:  .  albuterol (PROVENTIL) (2.5 MG/3ML) 0.083% nebulizer solution, Take 3 mLs (2.5 mg total) by nebulization 4 (four) times daily., Disp: 1080 mL, Rfl: 1 .  azelastine (ASTELIN) 0.1 % nasal spray, Place 1 spray into both nostrils daily as needed for allergies. , Disp: , Rfl:  .  bisoprolol (ZEBETA) 5 MG tablet, Take 1 tablet (5 mg total) by mouth daily., Disp: 30 tablet, Rfl: 3 .  budesonide (PULMICORT) 0.5 MG/2ML nebulizer solution, Take 0.5 mg by nebulization 2 (two) times daily., Disp: , Rfl:  .  cloNIDine (CATAPRES) 0.2 MG tablet, Take 0.2 mg by mouth 3 (three) times daily., Disp: , Rfl:  .  fluticasone (FLONASE) 50 MCG/ACT nasal spray, Place 2 sprays into both nostrils daily., Disp: , Rfl:  .  ipratropium (ATROVENT) 0.02 % nebulizer solution, TAKE 1 VIAL IN NEBULIZER 4 TIMES A DAY, Disp: 250 mL, Rfl: 0 .  losartan-hydrochlorothiazide (HYZAAR) 50-12.5 MG per tablet, Take 1 tablet by mouth daily., Disp: , Rfl:  .  montelukast (SINGULAIR) 10 MG tablet, Take 10 mg by mouth at bedtime., Disp: , Rfl:  .  omeprazole (PRILOSEC) 20 MG capsule, Take 2 capsules (40 mg total) by mouth daily. For GERD., Disp: 30 capsule, Rfl: 6 .  VENTOLIN HFA 108 (90 BASE) MCG/ACT inhaler, INHALE 2 PUFFS EVERY 6 HOURS AS NEEDED, Disp: 18 g, Rfl: 0 .  sodium chloride (OCEAN) 0.65 % SOLN nasal spray, Place 2 sprays into both nostrils as needed for congestion. (Patient not taking: Reported on 08/22/2015), Disp: , Rfl: 0 .  tamsulosin (FLOMAX) 0.4 MG CAPS capsule, Take 1 capsule by mouth daily., Disp: , Rfl:    Immunization History  Administered Date(s) Administered  . Influenza Split 09/27/2011, 08/20/2012, 07/27/2013  . Pneumococcal Polysaccharide-23 07/23/2012      Review of Systems  Constitutional: Negative for fever and unexpected weight change.  HENT: Negative for congestion, dental problem, ear pain, nosebleeds, postnasal drip, rhinorrhea, sinus  pressure, sneezing, sore throat and trouble swallowing.   Eyes: Negative for redness and itching.  Respiratory: Positive for cough and shortness of breath. Negative for chest tightness and wheezing.   Cardiovascular: Negative for palpitations and leg swelling.  Gastrointestinal: Negative for nausea and vomiting.  Genitourinary: Negative for dysuria.  Musculoskeletal: Negative for joint swelling.  Skin: Negative for rash.  Neurological: Negative for headaches.  Hematological: Does not bruise/bleed easily.  Psychiatric/Behavioral: Negative for dysphoric mood. The patient is not nervous/anxious.        Objective:   Physical Exam  Constitutional: He is oriented to person, place, and time. He appears well-developed and well-nourished. No distress.  Obese morbidly Refuses to weigh himself  HENT:  Head: Normocephalic and atraumatic.  Right Ear: External ear normal.  Left Ear: External ear normal.  Mouth/Throat: Oropharynx is clear and moist. No oropharyngeal exudate.  Higinio Plan is longer than last visit Mallampati class IV Right ear is clear Left ear has wax Oxygen on  Eyes:  Conjunctivae and EOM are normal. Pupils are equal, round, and reactive to light. Right eye exhibits no discharge. Left eye exhibits no discharge. No scleral icterus.  Neck: Normal range of motion. Neck supple. No JVD present. No tracheal deviation present. No thyromegaly present.  Cardiovascular: Normal rate, regular rhythm and intact distal pulses.  Exam reveals no gallop and no friction rub.   No murmur heard. Pulmonary/Chest: Effort normal and breath sounds normal. No respiratory distress. He has no wheezes. He has no rales. He exhibits no tenderness.  Abdominal: Soft. Bowel sounds are normal. He exhibits no distension and no mass. There is no tenderness. There is no rebound and no guarding.  Musculoskeletal: He exhibits edema. He exhibits no tenderness.  Physically deconditioned looking Sitting in wheel chair    Lymphadenopathy:    He has no cervical adenopathy.  Neurological: He is alert and oriented to person, place, and time. He has normal reflexes. No cranial nerve deficit. Coordination normal.  Skin: Skin is warm and dry. No rash noted. He is not diaphoretic. No erythema. No pallor.  Psychiatric: He has a normal mood and affect. His behavior is normal. Judgment and thought content normal.  Nursing note and vitals reviewed.   Filed Vitals:   08/22/15 1119  BP: 118/82  Pulse: 79  Height: 5\' 10"  (1.778 m)  Weight: 283 lb (128.368 kg)  SpO2: 95%         Assessment & Plan:     ICD-9-CM ICD-10-CM   1. Chronic respiratory failure with hypercapnia 518.83 J96.12   2. COPD, very severe 81 J44.9     Stable disease since July 2015 visit Continue o2 and nebulizers as before - will do refill FLu shot 08/22/2015 Prevnar vaccine 08/22/2015 If there is any change in shortness of breath of phlegm let us know immediately  Follow-up - 6 months or sooner if need   Dr. Brand Males, M.D., [facility].C.P Pulmonary and Critical Care Medicine Staff Physician [facility] Pager: [phone], If no answer or between  15:00h - 7:00h: call [phone]  08/22/2015 11:52 AM

## 2015-08-22 NOTE — Patient Instructions (Signed)
ICD-9-CM ICD-10-CM   1. Chronic respiratory failure with hypercapnia 518.83 J96.12   2. COPD, very severe 34 J44.9    Stable disease since July 2015 visit Continue o2 and nebulizers as before - will do refill FLu shot 08/22/2015 Prevnar vaccine 08/22/2015 If there is any change in shortness of breath of phlegm let us know immediately  Follow-up - 6 months or sooner if needed

## 2015-09-01 ENCOUNTER — Telehealth: Payer: Self-pay | Admitting: Internal Medicine

## 2015-09-01 NOTE — Telephone Encounter (Signed)
Spoke with pt's wife, aware of recs.  Nothing further needed.  

## 2015-09-01 NOTE — Telephone Encounter (Signed)
Spoke with pt's wife. State that pt's right foot is swelling. Denies skin being warm to the touch, pain in the foot or SOB. Would like a fluid pill to be called in.  MR - please advise. Thanks.

## 2015-09-01 NOTE — Telephone Encounter (Signed)
That is a Raymond Gurney, MD issue.

## 2015-09-14 ENCOUNTER — Telehealth: Payer: Self-pay | Admitting: Internal Medicine

## 2015-09-14 NOTE — Telephone Encounter (Signed)
I have checked the up font folders and MR's look at. The form is not in either place.  Isaac [name] - do you have this form?

## 2015-09-15 NOTE — Telephone Encounter (Signed)
Checked MR's look-at again, no [employer] forms for this pt have been received. Called pt's wife to make aware- she states that she will follow up on this and have it resent.   Will await fax.

## 2015-09-16 NOTE — Telephone Encounter (Signed)
EN - have you received this form yet?

## 2015-09-16 NOTE — Telephone Encounter (Signed)
I have not received this form. Will call on Monday 10.24.16 to have it re-faxed as urgent.

## 2015-09-17 MED ORDER — IPRATROPIUM BROMIDE 0.02 % IN SOLN: RESPIRATORY_TRACT | Status: DC

## 2015-09-17 NOTE — Telephone Encounter (Signed)
S:  Called by patients wife Drenda Freeze reporting they have still not [facility] (was last seen in office in September).  Needs one month called into pharmacy and verification of 4month [facility]   Plan: [facility] [address] [facility] month ref[facility] of Atrovent 0.02% neb solution QID, # 120, 0 refills - spoke with Lenn[facility] B[facility] @ [facility] Will attempt to [facility] for 3 month supply  (last note does not show refills in med list)   Noe Gens, NP-C [facility] Pgr: [phone] or if no answer [phone] 09/17/2015, 11:05 AM

## 2015-09-30 ENCOUNTER — Telehealth: Payer: Self-pay | Admitting: Internal Medicine

## 2015-09-30 MED ORDER — IPRATROPIUM BROMIDE 0.02 % IN SOLN: RESPIRATORY_TRACT | Status: DC

## 2015-09-30 NOTE — Telephone Encounter (Signed)
Spoke with patients wife and she said that Noe Gens, NP sent a prescription to [facility] for their Ipratropium Bromide for nebulizer, but they have not received the medication.   Called [facility] and they said that they never received the prescription.   Called in the Rx and confirmed patient's mailing address with [facility]. Patient's wife notified that RX has been called in and that [facility] will be shipping their medication to them. Patient's wife very grateful as patient was out of medication and they had to pay out of pocket for enough to last until they receive the mail order. Nothing further needed. Closing encounter

## 2015-10-12 ENCOUNTER — Telehealth: Payer: Self-pay | Admitting: Internal Medicine

## 2015-10-12 MED ORDER — IPRATROPIUM BROMIDE 0.02 % IN SOLN: RESPIRATORY_TRACT | Status: DC

## 2015-10-12 NOTE — Telephone Encounter (Signed)
Called and spoke to [place]. Informed him a [facility], [facility]. [facility]. Lennette Bihari verbalized understanding and denied any further questions or concerns at this time.

## 2015-10-23 ENCOUNTER — Other Ambulatory Visit: Payer: Self-pay | Admitting: Internal Medicine

## 2015-11-03 ENCOUNTER — Telehealth: Payer: Self-pay | Admitting: Internal Medicine

## 2015-11-03 MED ORDER — DOXYCYCLINE HYCLATE 100 MG PO TABS: 100.0000 mg | ORAL_TABLET | Freq: Two times a day (BID) | ORAL | Status: DC

## 2015-11-03 NOTE — Telephone Encounter (Signed)
Try   Take doxycycline 100mg  po twice daily x7 days; take after meals and avoid sunlight

## 2015-11-03 NOTE — Telephone Encounter (Signed)
Spoke with pt, aware of recs.  rx sent to preferred pharmacy.  Nothing further needed.  

## 2015-11-03 NOTE — Telephone Encounter (Signed)
Last ov with MR on 08/22/15  Called and spoke with patient. He c/o constant sinus drainage, chest congestion, and prod cough with white mucus starting on 11/02/15. Denies any fever, nausea or vomiting. He states he is using albuterol and atrovent nebs but would like a antibiotic called into the pharmacy. I explained to him that MR was out of the office today but would send the message to him to address. Patient voiced understanding and had no further questions.   MR please advise

## 2015-11-17 ENCOUNTER — Other Ambulatory Visit: Payer: Self-pay | Admitting: Internal Medicine

## 2015-11-30 ENCOUNTER — Other Ambulatory Visit: Payer: Self-pay | Admitting: Emergency Medicine

## 2015-11-30 MED ORDER — IPRATROPIUM BROMIDE 0.02 % IN SOLN: RESPIRATORY_TRACT | Status: DC

## 2015-12-02 ENCOUNTER — Other Ambulatory Visit: Payer: Self-pay | Admitting: Emergency Medicine

## 2015-12-02 MED ORDER — ALBUTEROL SULFATE (2.5 MG/3ML) 0.083% IN NEBU: INHALATION_SOLUTION | RESPIRATORY_TRACT | Status: DC

## 2015-12-08 ENCOUNTER — Other Ambulatory Visit: Payer: Self-pay | Admitting: Internal Medicine

## 2015-12-08 NOTE — Telephone Encounter (Signed)
Rx already sent 12/02/15 and receipt confirmed by pharmacy.

## 2015-12-09 ENCOUNTER — Other Ambulatory Visit: Payer: Self-pay | Admitting: Internal Medicine

## 2015-12-09 DIAGNOSIS — R739 Hyperglycemia, unspecified: Secondary | ICD-10-CM | POA: Diagnosis not present

## 2015-12-09 DIAGNOSIS — I10 Essential (primary) hypertension: Secondary | ICD-10-CM | POA: Diagnosis not present

## 2015-12-09 DIAGNOSIS — E7800 Pure hypercholesterolemia, unspecified: Secondary | ICD-10-CM | POA: Diagnosis not present

## 2015-12-09 DIAGNOSIS — J449 Chronic obstructive pulmonary disease, unspecified: Secondary | ICD-10-CM | POA: Diagnosis not present

## 2015-12-13 ENCOUNTER — Telehealth: Payer: Self-pay | Admitting: Internal Medicine

## 2015-12-13 MED ORDER — ALBUTEROL SULFATE (2.5 MG/3ML) 0.083% IN NEBU: INHALATION_SOLUTION | RESPIRATORY_TRACT | Status: DC

## 2015-12-13 NOTE — Telephone Encounter (Signed)
Called spoke with Germaine. Pt needs refill on [facility]. I have sent this in. Nothing further needed

## 2016-01-19 DIAGNOSIS — H6981 Other specified disorders of Eustachian tube, right ear: Secondary | ICD-10-CM | POA: Insufficient documentation

## 2016-01-19 DIAGNOSIS — H6122 Impacted cerumen, left ear: Secondary | ICD-10-CM | POA: Insufficient documentation

## 2016-02-22 ENCOUNTER — Ambulatory Visit: Payer: 59 | Admitting: Internal Medicine

## 2016-04-02 ENCOUNTER — Telehealth: Payer: Self-pay | Admitting: Internal Medicine

## 2016-04-02 MED ORDER — AMOXICILLIN-POT CLAVULANATE 875-125 MG PO TABS: 1.0000 | ORAL_TABLET | Freq: Two times a day (BID) | ORAL | Status: DC

## 2016-04-02 NOTE — Telephone Encounter (Signed)
Patient has sinus pressure, drainage down throat into chest, coughing out yellow mucus, blowing out yellow mucus.  Short of breath.  If he lays down the mucus accumulates and he has to cough out the mucus.  Hurting chest.  Patient states his right ear hurts and feels "full" of infection.  Chest feels tight, but gets better for a little while after he uses his nebulizer.    [facility] [address]  Allergies  Allergen Reactions  . Codeine Itching    "jittery"  . Prozac [Fluoxetine Hcl]     confusion    To Dr. Vaughan Browner in Dr. Golden Pop absence.

## 2016-04-02 NOTE — Telephone Encounter (Signed)
Prescribe Augmentin 875 mg every 12 hrs for 7 days

## 2016-04-02 NOTE — Telephone Encounter (Signed)
Pt is aware of PM's recommendation. Rx has been sent in. Nothing further was needed.

## 2016-05-04 ENCOUNTER — Ambulatory Visit: Payer: 59 | Admitting: Internal Medicine

## 2016-05-11 ENCOUNTER — Other Ambulatory Visit: Payer: Self-pay | Admitting: Internal Medicine

## 2016-05-21 ENCOUNTER — Other Ambulatory Visit: Payer: Self-pay | Admitting: Internal Medicine

## 2016-07-24 ENCOUNTER — Other Ambulatory Visit: Payer: Self-pay | Admitting: Internal Medicine

## 2016-08-16 ENCOUNTER — Ambulatory Visit: Payer: 59 | Admitting: Internal Medicine

## 2016-08-23 ENCOUNTER — Other Ambulatory Visit: Payer: Self-pay | Admitting: Internal Medicine

## 2016-08-27 ENCOUNTER — Telehealth: Payer: Self-pay | Admitting: Internal Medicine

## 2016-08-27 MED ORDER — ALBUTEROL SULFATE HFA 108 (90 BASE) MCG/ACT IN AERS: 2.0000 | INHALATION_SPRAY | RESPIRATORY_TRACT | 6 refills | Status: DC | PRN

## 2016-08-27 MED ORDER — IPRATROPIUM BROMIDE 0.02 % IN SOLN: RESPIRATORY_TRACT | 6 refills | Status: DC

## 2016-08-27 NOTE — Telephone Encounter (Signed)
Called and spoke with pts wife and she stated that the ventolin needed to go to his local pharmacy and this has been sent in.  She also stated that he needed to have the ipatropium sent in for the [facility]bulizer [facility], this has been sent in as well and nothing further is needed

## 2016-09-05 ENCOUNTER — Encounter: Payer: Self-pay | Admitting: Internal Medicine

## 2016-09-05 ENCOUNTER — Ambulatory Visit (INDEPENDENT_AMBULATORY_CARE_PROVIDER_SITE_OTHER): Payer: 59 | Admitting: Internal Medicine

## 2016-09-05 VITALS — BP 118/76 | HR 80 | Ht 71.0 in | Wt 315.4 lb

## 2016-09-05 DIAGNOSIS — J449 Chronic obstructive pulmonary disease, unspecified: Secondary | ICD-10-CM

## 2016-09-05 DIAGNOSIS — J9612 Chronic respiratory failure with hypercapnia: Secondary | ICD-10-CM | POA: Diagnosis not present

## 2016-09-05 MED ORDER — ALBUTEROL SULFATE HFA 108 (90 BASE) MCG/ACT IN AERS: 2.0000 | INHALATION_SPRAY | RESPIRATORY_TRACT | 6 refills | Status: DC | PRN

## 2016-09-05 NOTE — Patient Instructions (Addendum)
ICD-9-CM ICD-10-CM   1. Chronic respiratory failure with hypercapnia (HCC) 518.83 J96.12   2. COPD, very severe ([place]) 496 J44.9     Stable disease  Plan 0-- flu shot 09/05/2016 - respect desire not to pursue nocturnal bipap option for copd - increase duoneb to to q4h (6 times daily) - continue pulmicort nebs twice daily  - continue singulair daily - refill ventolin albuterol mdi for prn use - will recommend short diprivan general anesthesia for oral surgery procedures unless awake  - I do not think you can handle moderate sedation - contnue o2 as before - for jan 2018 wedding related anxiety - call us 1 week before and we can consider verr low dose benzo x 1 dose  Followup 6 months or sooner if needed

## 2016-09-05 NOTE — Progress Notes (Signed)
Subjective:     Patient ID: Isaac [name], male   DOB: [birth year], 60 y.o.   MRN: [id number]  HPI    #Obesity Body mass index is 39.91 kg/(m^2). on 03/26/2013  #Chronic sinusitis T sinus 2006   - IMPRESSION:  1. Ethmoid and frontal sinus dise Case. Sphenoid and maxillary sinuses are clear.  2. Patent ostiomeatal complexes.  #Smoking history  reports that he quit smoking about 2 years ago. His smoking use included Cigarettes. He has a 35 pack-year smoking history. He does not have any smokeless tobacco history on file.  #Multifactorial dyspnea -  Nuclear med stress test  2013 -  - negative per hx. Done by Dr Einar Gip per hx  #COPD - Severe/very severe COPD Spirometry 12/31/11 done at [place]- fev1 0.8L/21%, Rati 39, 23% BD response on fev1 May 2013: Walk test in office : Pulse 88% at rest -> 1 lap x 185 feet - pulse ox 88% and HR 110 and very dyspneic, so stopped - Status post pulmonary rehabilitation fall 2013   #Evaluation for lung cancer : CXR 12/24/11   Clear lung fields - personally revieweed  #Recurrent COPD - May 2013: Outpatient treatment with doxycycline and prednisone - August 2013: Hospitalization for COPD exacerbation - January 2014: Outpatient treatment with antibiotics and prednisone - March 2015 : telephoine Rx with sinusitis, abx and prednisone - April 2015: telephoine Rx with abx and prednisone - June 2014 - VDRF with admission to ICU.        OV 06/08/2014  Chief Complaint  Patient presents with  . Follow-up    Pt states since using his neb meds his breathing has improved.C/o DOE. Denies CP/tightness and cough.    Followup from recent ICU hospitalization for respiratory failure. At this point in time he is significantly improved. He used to be bothered by significant sinus blockage for which he had an extensive workup including MRI and MRA but apparently after he started using nebulizers instead of inhalers this symptom has resolved. He still physically  deconditioned. He did have home physical therapy but he found him down an operative go self-directed exercises but after my counseling today he is open to reinitiating home physical therapy. This is because he still physically deconditioned. He uses oxygen and nebulizers on a compliant fashion. PFTs today show significant severe Gold stage IV lung disease with FEV1 of 0.92/24% and a ratio of 42. Total lung capacity of 122% and a DLCO that significantly impaired at 8.8/26%.  Labs reviewed June 2040 in the hospital at discharge he was anemic with a hemoglobin of 10 g percent. This has not been followed up. His chemistries were normal.  Past medical history: He did have a sleep study over the weekend. I do not know the results. This was ordered by Dr. Halford Chessman and I will set up followup for this to Dr. Jaclyn Prime 08/22/2015  Chief Complaint  Patient presents with  . Follow-up    Pt states his breathing was worse over the summer because of the heat and humidity. Pt c/o night time prod cough with white mucus, right ear pressure, intermittent sinus pressure. Pt denies CP/tightness.    Follow-up chronic respiratory failure with hypoxemia and hypercapnia associated with Gold stage IV COPD and morbid obesity.  In early 2015 he had life-threatening hospitalization from respiratory failure. Last visit in the office was July 2015. After that he failed to follow-up. He now presents with his wife. They want refills of his nebulizers and  wants to up-to-date himself with the vaccines. The last 1 year they deny any hospitalizations or emergency room visits or urgent care visits or new medical diagnoses. He is mostly sedentary and is house sitting but he does do limited activities of daily living such as changing clothes and self-feed. He did travel to the beach but he stayed in the hotel in the beach. Currently he does have some mild baseline cough with yellow sputum but this is unchanged compared to baseline. There are no  new issues.   OV 09/05/2016  Chief Complaint  Patient presents with  . Follow-up    Pt breathing is feels slightly worst due to weather. Breathing has changed since last seen. Uses nebulizer after excertion. No tightness, or congestion today. Coughing is sometimes dry and productive. coughing up clear mucus. Discuss flu shot, handy cap plaquer, and rescue inhaler.     Follow-up chronic approximately possibly hypercapnic respiratory failure from Gold stage IV COPD and morbid obesity and sleep apnea ruled out per history  Last seen one year ago. He was supposed to come back in 6 months but did not. Review of the chart suggests that he did not have any hospitalizations or emergency room visits. He and his wife confirmed that. He is extremely sedentary. He spends most of his day in bed playing video games and surfing the Internet. He has the ability to walk from room to room but is limited because of shortness of breath. He requests some help with activities of daily living such as changing clothes or taking a bath. Overall stable. He is asking for an increase in his nebulizer frequency. His upcoming oral surgery evaluation and might require anesthesia for it. Is requesting flu shot.      has a past medical history of Anxiety; COPD (chronic obstructive pulmonary disease) ([place]); GERD (gastroesophageal reflux disease); Hematuria; Hyperlipidemia; Hypertension; Insomnia; Nephrolithiasis; and Tobacco dependence.   reports that he quit smoking about 5 years ago. His smoking use included Cigarettes. He has a 35.00 pack-year smoking history. He does not have any smokeless tobacco history on file.  Past Surgical History:  Procedure Laterality Date  . LITHOTRIPSY      Allergies  Allergen Reactions  . Codeine Itching    "jittery"  . Prozac [Fluoxetine Hcl]     confusion    Immunization History  Administered Date(s) Administered  . Influenza Split 09/27/2011, 08/20/2012, 07/27/2013  .  Influenza,inj,Quad PF,36+ Mos 08/22/2015  . Pneumococcal Conjugate-13 08/22/2015  . Pneumococcal Polysaccharide-23 07/23/2012    Family History  Problem Relation Age of Onset  . Heart disease Father   . Stroke Mother   . Leukemia Brother      Current Outpatient Prescriptions:  .  albuterol (PROVENTIL) (2.5 MG/3ML) 0.083% nebulizer solution, USE 1 VIAL BY NEBULIZATION FOUR TIMES A DAY, Disp: 1080 mL, Rfl: 1 .  albuterol (VENTOLIN HFA) 108 (90 Base) MCG/ACT inhaler, Inhale 2 puffs into the lungs every 4 (four) hours as needed for wheezing or shortness of breath., Disp: 1 Inhaler, Rfl: 6 .  azelastine (ASTELIN) 0.1 % nasal spray, Place 1 spray into both nostrils daily as needed for allergies. , Disp: , Rfl:  .  bisoprolol (ZEBETA) 5 MG tablet, Take 1 tablet (5 mg total) by mouth daily., Disp: 30 tablet, Rfl: 3 .  budesonide (PULMICORT) 0.5 MG/2ML nebulizer solution, Take 2 mLs (0.5 mg total) by nebulization 2 (two) times daily., Disp: 180 mL, Rfl: 2 .  cloNIDine (CATAPRES) 0.2 MG tablet, Take 0.2  mg by mouth 3 (three) times daily., Disp: , Rfl:  .  fluticasone (FLONASE) 50 MCG/ACT nasal spray, Place 2 sprays into both nostrils daily., Disp: , Rfl:  .  ipratropium (ATROVENT) 0.02 % nebulizer solution, USE 1 VIAL IN NEBULIZER FOUR TIMES A DAY, Disp: 360 mL, Rfl: 6 .  losartan-hydrochlorothiazide (HYZAAR) 50-12.5 MG per tablet, Take 1 tablet by mouth daily., Disp: , Rfl:  .  montelukast (SINGULAIR) 10 MG tablet, Take 10 mg by mouth at bedtime., Disp: , Rfl:  .  omeprazole (PRILOSEC) 20 MG capsule, Take 2 capsules (40 mg total) by mouth daily. For GERD., Disp: 30 capsule, Rfl: 6 .  sodium chloride (OCEAN) 0.65 % SOLN nasal spray, Place 2 sprays into both nostrils as needed for congestion., Disp: , Rfl: 0    Review of Systems     Objective:   Physical Exam  Constitutional: He is oriented to person, place, and time. He appears well-developed and well-nourished. No distress.  Morbidly obese   HENT:  Head: Normocephalic and atraumatic.  Right Ear: External ear normal.  Left Ear: External ear normal.  Mouth/Throat: Oropharynx is clear and moist. No oropharyngeal exudate.  Long hair and beard  Eyes: Conjunctivae and EOM are normal. Pupils are equal, round, and reactive to light. Right eye exhibits no discharge. Left eye exhibits no discharge. No scleral icterus.  Neck: Normal range of motion. Neck supple. No JVD present. No tracheal deviation present. No thyromegaly present.  Cardiovascular: Normal rate, regular rhythm and intact distal pulses.  Exam reveals no gallop and no friction rub.   No murmur heard. Pulmonary/Chest: Effort normal and breath sounds normal. No respiratory distress. He has no wheezes. He has no rales. He exhibits no tenderness.  Apparently overall diminished  Abdominal: Soft. Bowel sounds are normal. He exhibits no distension and no mass. There is no tenderness. There is no rebound and no guarding.  Musculoskeletal: Normal range of motion. He exhibits no edema or tenderness.  Sitting in wheel chair  Lymphadenopathy:    He has no cervical adenopathy.  Neurological: He is alert and oriented to person, place, and time. He has normal reflexes. No cranial nerve deficit. Coordination normal.  Skin: Skin is warm and dry. No rash noted. He is not diaphoretic. No erythema. No pallor.  Dry skin  Psychiatric: He has a normal mood and affect. His behavior is normal. Judgment and thought content normal.  Nursing note and vitals reviewed.   Vitals:   09/05/16 1231 09/05/16 1232  BP:  118/76  Pulse:  80  SpO2:  94%  Weight: (!) 315 lb 6.4 oz (143.1 kg)   Height: 5\' 11"  (1.803 m)         Assessment:       ICD-9-CM ICD-10-CM   1. Chronic respiratory failure with hypercapnia (HCC) 518.83 J96.12   2. COPD, very severe ([place]) 496 J44.9        Plan:      Stable disease  Plan 0-- flu shot 09/05/2016 - respect desire not to pursue nocturnal bipap option for  copd - increase duoneb to to q4h (6 times daily) - continue pulmicort nebs twice daily  - continue singulair daily - refill ventolin albuterol mdi for prn use - continue o2 as before - will recommend short diprivan general anesthesia for oral surgery procedures unless awake  - I do not think you can handle moderate sedation - for jan 2018 wedding related anxiety - call us 1 week before and we can  consider verr low dose benzo x 1 dose  Followup 6 months or sooner if needed   > 50% of this > 25 min visit spent in face to face counseling or coordination of care    Dr. Brand Males, M.D., [facility].C.P Pulmonary and Critical Care Medicine Staff Physician [place] [facility] Pager: [phone], If no answer or between  15:00h - 7:00h: call [phone]  09/05/2016 12:54 PM

## 2016-10-25 ENCOUNTER — Telehealth: Payer: Self-pay | Admitting: Internal Medicine

## 2016-10-25 NOTE — Telephone Encounter (Signed)
LMOMTCB x 1 

## 2016-10-26 NOTE — Telephone Encounter (Signed)
Patient wife calling back - she can be reached at [phone]. -pr

## 2016-10-26 NOTE — Telephone Encounter (Signed)
Called and spoke with pts wife and she stated that the pt is almost out of the albuterol for his neb.  She stated that MR changed this at his last visit, but [facility] does not have the updated rx.    MR it says on your OV note that the pt will be doing Duoneb 6 times per day.  Did you want this changed?  On the pts med list it says that he only has the plain albuterol for the neb.  Please advise.  Thanks  Allergies  Allergen Reactions  . Codeine Itching    "jittery"  . Prozac [Fluoxetine Hcl]     confusion

## 2016-10-29 NOTE — Telephone Encounter (Signed)
I typically write for duoeb - in his case duoneb 4 times a day scheduled (not 6) and then albueterol prn. It is possible I got confused. If he was not on duoneb before I recommend he/wife giv himself a trial of duoneb.   Dr. Brand Males, M.D., [facility].C.P Pulmonary and Critical Care Medicine Staff Physician [place] [facility] Pager: [phone], If no answer or between  15:00h - 7:00h: call [phone]  10/29/2016 5:14 PM

## 2016-10-29 NOTE — Telephone Encounter (Signed)
LM x 1 

## 2016-10-30 ENCOUNTER — Telehealth: Payer: Self-pay | Admitting: Internal Medicine

## 2016-10-30 ENCOUNTER — Other Ambulatory Visit: Payer: Self-pay

## 2016-10-30 DIAGNOSIS — J449 Chronic obstructive pulmonary disease, unspecified: Secondary | ICD-10-CM

## 2016-10-30 MED ORDER — ALBUTEROL SULFATE (2.5 MG/3ML) 0.083% IN NEBU: 2.5000 mg | INHALATION_SOLUTION | RESPIRATORY_TRACT | 1 refills | Status: DC | PRN

## 2016-10-30 MED ORDER — CEPHALEXIN 500 MG PO CAPS: 500.0000 mg | ORAL_CAPSULE | Freq: Three times a day (TID) | ORAL | 0 refills | Status: DC

## 2016-10-30 MED ORDER — PREDNISONE 10 MG PO TABS: ORAL_TABLET | ORAL | 0 refills | Status: DC

## 2016-10-30 MED ORDER — IPRATROPIUM-ALBUTEROL 0.5-2.5 (3) MG/3ML IN SOLN: 3.0000 mL | Freq: Four times a day (QID) | RESPIRATORY_TRACT | 0 refills | Status: DC

## 2016-10-30 MED ORDER — ALBUTEROL SULFATE (2.5 MG/3ML) 0.083% IN NEBU: 2.5000 mg | INHALATION_SOLUTION | RESPIRATORY_TRACT | 0 refills | Status: DC | PRN

## 2016-10-30 NOTE — Telephone Encounter (Signed)
Called and spoke to pt. Pt c/o increase in SOB, prod cough with white mucus, sinus congestion with yellow mucus, sore throat, and cold sweats x 2 days. Pt has not taken temperature.   Dr. Chase Caller please advise. Thanks.

## 2016-10-30 NOTE — Telephone Encounter (Signed)
lmtcb x2 for pt. 

## 2016-10-30 NOTE — Telephone Encounter (Signed)
Per MR: Keflex 500mg  1 TID x 5 days and pred 10mg  - 40 mg daily x 2 days, then 20mg  daily x 2 days, then 10mg  daily x 2 days, then 5mg  daily x 2 days and stop.   -----------------------------------------------  Called and spoke to pt. Informed him of the recs per MR. Rx sent to preferred pharmacy. Pt verbalized understanding and denied any further questions or concerns at this time.

## 2016-10-30 NOTE — Telephone Encounter (Signed)
Spoke with wife and gave her MR reccom. She stated it was ok to place him on the DuoNeb. It was sent into [facility] pharmacy of choice to see how he is doing on it. Also a rx for the albuterol was sent to the same pharmacy since he was out. At this time nothing further is needed at this time.    Isaac Males, MD      5:13 PM  Note    I typically write for duoeb - in his case duoneb 4 times a day scheduled (not 6) and then albueterol prn. It is possible I got confused. If he was not on duoneb before I recommend he/wife giv himself a trial of duoneb.   Dr. Brand [name], M.D., [facility].C.P Pulmonary and Critical Care Medicine Staff Physician [name] [facility] Pager: [phone], If no answer or between  15:00h - 7:00h: call [phone]  10/29/2016 5:14 PM

## 2016-11-08 ENCOUNTER — Telehealth: Payer: Self-pay | Admitting: Internal Medicine

## 2016-11-08 NOTE — Telephone Encounter (Signed)
Called and spoke to pt. Pt states his SOB has improved since last OV but states he has a dry cough/tickle in the back of his throat x 1 day. Pt states he does not feel he can sleep through the night tonight and is requesting a response back today. Pt denies CP/tightness, f/c/s, chest congestion. Pt states he just completed the pred taper yesterday.   Dr Chase Caller please advise. Thanks.

## 2016-11-08 NOTE — Telephone Encounter (Signed)
Called pt, aware of rec's per MR  Pt states that he feels like he is getting bronchitis.  Pt is hoarse and states that his throat is sore. Pt advised that he needs an appt and patient refused to schedule.  Pt advised to try the rec's and some Benadryl tonight and to call us tomorrow morning - seek care at ED if symptoms worsen overnight. Pt states that he is going to try some OTC decongestants and cough syrup to see if he can get some relief. Will send to MR for further rec's. Pt states he feels that he may need an abx soon.   - MR off this afternoon  Please advise Dr Chase Caller. Thanks.

## 2016-11-08 NOTE — Telephone Encounter (Signed)
Per MR: --Is patient using nasal steroid? Should be using Flonase. If not using Flonase, needs to get on nasal spray asap  --Sleep with head of bed elevated  --If Flonase and bed elevation does not work then patient needs to try Benadryl 25mg  daily. Try this for a few days and call back if no improvement.

## 2016-11-09 NOTE — Telephone Encounter (Signed)
He just completed keflex. If he wants he can try Z PAK. Let him know  Allergies  Allergen Reactions  . Codeine Itching    "jittery"  . Prozac [Fluoxetine Hcl]     confusion

## 2016-11-09 NOTE — Telephone Encounter (Signed)
LMTCB x1 for pt.  

## 2016-11-13 MED ORDER — AZITHROMYCIN 250 MG PO TABS: ORAL_TABLET | ORAL | 0 refills | Status: DC

## 2016-11-13 NOTE — Telephone Encounter (Signed)
Called and spoke with pt and he is aware of MR recs.  He requested that the zpak be sent to the pharmacy. Nothing further is needed.

## 2016-12-05 ENCOUNTER — Other Ambulatory Visit: Payer: Self-pay | Admitting: Internal Medicine

## 2016-12-05 ENCOUNTER — Telehealth: Payer: Self-pay | Admitting: Internal Medicine

## 2016-12-05 MED ORDER — IPRATROPIUM-ALBUTEROL 0.5-2.5 (3) MG/3ML IN SOLN: 3.0000 mL | Freq: Four times a day (QID) | RESPIRATORY_TRACT | 2 refills | Status: DC

## 2016-12-05 NOTE — Telephone Encounter (Signed)
lmtcb x1 for pt's wife. 

## 2016-12-05 NOTE — Telephone Encounter (Signed)
Spoke with spouse to verify the pharmacy  Rx for Duoneb was refilled  Nothing further needed

## 2016-12-05 NOTE — Telephone Encounter (Signed)
Patient is returning phone call.  °

## 2016-12-09 DIAGNOSIS — J449 Chronic obstructive pulmonary disease, unspecified: Secondary | ICD-10-CM | POA: Diagnosis not present

## 2016-12-11 ENCOUNTER — Telehealth: Payer: Self-pay | Admitting: Internal Medicine

## 2016-12-11 MED ORDER — ALPRAZOLAM 0.25 MG PO TABS: 0.2500 mg | ORAL_TABLET | Freq: Every evening | ORAL | 0 refills | Status: DC | PRN

## 2016-12-11 NOTE — Telephone Encounter (Signed)
Ok to take xanax 0.25mg  at bedtime for 1 week before wedding. However, given copd and o2 need and obesity - patient needs to understand risk of resp compromise with this and should be willing to take risk  Thanks  Dr. Brand Males, M.D., [facility].C.P Pulmonary and Critical Care Medicine Staff Physician [place] [facility] Pager: [phone], If no answer or between  15:00h - 7:00h: call [phone]  12/11/2016 3:30 PM

## 2016-12-11 NOTE — Telephone Encounter (Signed)
Spoke with pt's mother. She is aware that we will get this prescription called in. Rx has been called in. Nothing further was needed.

## 2016-12-11 NOTE — Telephone Encounter (Signed)
Called and spoke to pt's wife, Drenda Freeze. Pt is requesting an anti-anxiety med prior to son's wedding this weekend. Drenda Freeze is requesting a low dose of anti-anxiety med. Pt last seen in 08/2016.   MR please advise. Thanks.    OV Instructions from last OV:  Plan 0-- flu shot 09/05/2016 - respect desire not to pursue nocturnal bipap option for copd - increase duoneb to to q4h (6 times daily) - continue pulmicort nebs twice daily  - continue singulair daily - refill ventolin albuterol mdi for prn use - will recommend short diprivan general anesthesia for oral surgery procedures unless awake             - I do not think you can handle moderate sedation - contnue o2 as before - for jan 2018 wedding related anxiety - call us 1 week before and we can consider verr low dose benzo x 1 dose  Followup 6 months or sooner if needed

## 2016-12-17 ENCOUNTER — Emergency Department (HOSPITAL_COMMUNITY): Payer: 59

## 2016-12-17 ENCOUNTER — Emergency Department (HOSPITAL_COMMUNITY)
Admission: EM | Admit: 2016-12-17 | Discharge: 2016-12-17 | Disposition: A | Discharge status: Home / Self Care | Payer: 59 | Attending: Emergency Medicine | Admitting: Emergency Medicine

## 2016-12-17 ENCOUNTER — Encounter (HOSPITAL_COMMUNITY): Payer: Self-pay | Admitting: Emergency Medicine

## 2016-12-17 DIAGNOSIS — Z87891 Personal history of nicotine dependence: Secondary | ICD-10-CM | POA: Insufficient documentation

## 2016-12-17 DIAGNOSIS — D72829 Elevated white blood cell count, unspecified: Secondary | ICD-10-CM | POA: Insufficient documentation

## 2016-12-17 DIAGNOSIS — R945 Abnormal results of liver function studies: Secondary | ICD-10-CM | POA: Insufficient documentation

## 2016-12-17 DIAGNOSIS — I5032 Chronic diastolic (congestive) heart failure: Secondary | ICD-10-CM | POA: Diagnosis not present

## 2016-12-17 DIAGNOSIS — J449 Chronic obstructive pulmonary disease, unspecified: Secondary | ICD-10-CM | POA: Diagnosis not present

## 2016-12-17 DIAGNOSIS — I110 Hypertensive heart disease with heart failure: Secondary | ICD-10-CM | POA: Insufficient documentation

## 2016-12-17 DIAGNOSIS — R1010 Upper abdominal pain, unspecified: Secondary | ICD-10-CM

## 2016-12-17 DIAGNOSIS — J0100 Acute maxillary sinusitis, unspecified: Secondary | ICD-10-CM | POA: Insufficient documentation

## 2016-12-17 DIAGNOSIS — R7989 Other specified abnormal findings of blood chemistry: Secondary | ICD-10-CM

## 2016-12-17 DIAGNOSIS — R05 Cough: Secondary | ICD-10-CM | POA: Diagnosis not present

## 2016-12-17 DIAGNOSIS — R0602 Shortness of breath: Secondary | ICD-10-CM | POA: Diagnosis not present

## 2016-12-17 DIAGNOSIS — R0981 Nasal congestion: Secondary | ICD-10-CM | POA: Diagnosis present

## 2016-12-17 DIAGNOSIS — Z79899 Other long term (current) drug therapy: Secondary | ICD-10-CM | POA: Insufficient documentation

## 2016-12-17 LAB — CBC WITH DIFFERENTIAL/PLATELET
Band Neutrophils: 0 %
Basophils Absolute: 0 10*3/uL (ref 0.0–0.1)
Basophils Relative: 0 %
Blasts: 0 %
Eosinophils Absolute: 0 10*3/uL (ref 0.0–0.7)
Eosinophils Relative: 0 %
HCT: 42.8 % (ref 39.0–52.0)
Hemoglobin: 13.5 g/dL (ref 13.0–17.0)
Lymphocytes Relative: 69 %
Lymphs Abs: 19.8 10*3/uL — ABNORMAL HIGH (ref 0.7–4.0)
MCH: 28.3 pg (ref 26.0–34.0)
MCHC: 31.5 g/dL (ref 30.0–36.0)
MCV: 89.7 fL (ref 78.0–100.0)
Metamyelocytes Relative: 0 %
Monocytes Absolute: 1.7 10*3/uL — ABNORMAL HIGH (ref 0.1–1.0)
Monocytes Relative: 6 %
Myelocytes: 0 %
Neutro Abs: 7.2 10*3/uL (ref 1.7–7.7)
Neutrophils Relative %: 25 %
Other: 0 %
Platelets: 261 10*3/uL (ref 150–400)
Promyelocytes Absolute: 0 %
RBC: 4.77 MIL/uL (ref 4.22–5.81)
RDW: 15.2 % (ref 11.5–15.5)
WBC: 28.7 10*3/uL — ABNORMAL HIGH (ref 4.0–10.5)
nRBC: 0 /100 WBC

## 2016-12-17 LAB — COMPREHENSIVE METABOLIC PANEL
ALT: 76 U/L — ABNORMAL HIGH (ref 17–63)
AST: 88 U/L — ABNORMAL HIGH (ref 15–41)
Albumin: 3.9 g/dL (ref 3.5–5.0)
Alkaline Phosphatase: 92 U/L (ref 38–126)
Anion gap: 8 (ref 5–15)
BUN: 7 mg/dL (ref 6–20)
CO2: 30 mmol/L (ref 22–32)
Calcium: 8.5 mg/dL — ABNORMAL LOW (ref 8.9–10.3)
Chloride: 101 mmol/L (ref 101–111)
Creatinine, Ser: 0.96 mg/dL (ref 0.61–1.24)
GFR calc Af Amer: >60 mL/min (ref >60)
GFR calc non Af Amer: >60 mL/min (ref >60)
Glucose, Bld: 184 mg/dL — ABNORMAL HIGH (ref 65–99)
Potassium: 4.1 mmol/L (ref 3.5–5.1)
Sodium: 139 mmol/L (ref 135–145)
Total Bilirubin: 0.4 mg/dL (ref 0.3–1.2)
Total Protein: 6.8 g/dL (ref 6.5–8.1)

## 2016-12-17 LAB — RAPID STREP SCREEN (MED CTR MEBANE ONLY): Streptococcus, Group A Screen (Direct): NEGATIVE

## 2016-12-17 LAB — URINALYSIS, ROUTINE W REFLEX MICROSCOPIC
Bacteria, UA: NONE SEEN
Bilirubin Urine: NEGATIVE
Glucose, UA: NEGATIVE mg/dL
Hgb urine dipstick: NEGATIVE
Ketones, ur: NEGATIVE mg/dL
Nitrite: NEGATIVE
Protein, ur: NEGATIVE mg/dL
Specific Gravity, Urine: 1.024 (ref 1.005–1.030)
pH: 5 (ref 5.0–8.0)

## 2016-12-17 LAB — I-STAT TROPONIN, ED: Troponin i, poc: 0 ng/mL (ref 0.00–0.08)

## 2016-12-17 LAB — LIPASE, BLOOD: Lipase: 98 U/L — ABNORMAL HIGH (ref 11–51)

## 2016-12-17 LAB — INFLUENZA PANEL BY PCR (TYPE A & B)
Influenza A By PCR: NEGATIVE
Influenza B By PCR: NEGATIVE

## 2016-12-17 LAB — I-STAT CG4 LACTIC ACID, ED
Lactic Acid, Venous: 0.81 mmol/L (ref 0.5–1.9)
Lactic Acid, Venous: 0.71 mmol/L (ref 0.5–1.9)

## 2016-12-17 LAB — BRAIN NATRIURETIC PEPTIDE: B Natriuretic Peptide: 31.2 pg/mL (ref 0.0–100.0)

## 2016-12-17 LAB — PATHOLOGIST SMEAR REVIEW

## 2016-12-17 MED ORDER — METHYLPREDNISOLONE SODIUM SUCC 125 MG IJ SOLR
125.0000 mg | Freq: Once | INTRAMUSCULAR | Status: AC
Start: 2016-12-17 — End: 2016-12-17
  Administered 2016-12-17: 125 mg via INTRAVENOUS
  Filled 2016-12-17: qty 2

## 2016-12-17 MED ORDER — ALBUTEROL SULFATE (2.5 MG/3ML) 0.083% IN NEBU
5.0000 mg | INHALATION_SOLUTION | Freq: Once | RESPIRATORY_TRACT | Status: AC
  Administered 2016-12-17: 5 mg via RESPIRATORY_TRACT
  Filled 2016-12-17: qty 6

## 2016-12-17 MED ORDER — ONDANSETRON HCL 4 MG/2ML IJ SOLN
4.0000 mg | Freq: Once | INTRAMUSCULAR | Status: AC
  Administered 2016-12-17: 4 mg via INTRAVENOUS
  Filled 2016-12-17: qty 2

## 2016-12-17 MED ORDER — ONDANSETRON HCL 4 MG PO TABS: 4.0000 mg | ORAL_TABLET | Freq: Four times a day (QID) | ORAL | 0 refills | Status: DC

## 2016-12-17 MED ORDER — LORATADINE 10 MG PO TABS: 10.0000 mg | ORAL_TABLET | Freq: Every day | ORAL | 0 refills | Status: DC

## 2016-12-17 MED ORDER — PREDNISONE 20 MG PO TABS: ORAL_TABLET | ORAL | 0 refills | Status: DC

## 2016-12-17 MED ORDER — GI COCKTAIL ~~LOC~~
30.0000 mL | Freq: Once | ORAL | Status: AC
  Administered 2016-12-17: 30 mL via ORAL
  Filled 2016-12-17: qty 30

## 2016-12-17 MED ORDER — LEVOFLOXACIN 750 MG PO TABS: 750.0000 mg | ORAL_TABLET | Freq: Every day | ORAL | 0 refills | Status: DC

## 2016-12-17 NOTE — ED Triage Notes (Signed)
Per [facility] pt complaint of flu like symptoms with associated nausea, vomiting, headache, body aches, sore teeth, and congestion. Pt on 3 lpm [place]

## 2016-12-17 NOTE — ED Notes (Signed)
ED Provider at bedside. Isaac [name]

## 2016-12-17 NOTE — ED Provider Notes (Signed)
[place] DEPT Provider Note   CSN: [id number] Arrival date & time: 12/17/16  [id number]     History   Chief Complaint Chief Complaint  Patient presents with  . Flu Like Symptoms    HPI Isaac [name] is a 61 y.o. male.  HPI patient presents with multiple complaints. States he's been not feeling well for a week. He has history of COPD on 3 L home O2. Admits to coughing productive of yellow sputum and increased wheezing and shortness of breath. Also complains of nasal congestion and sinus pressure. Denies fever but endorses chills. Complains of sore throat and difficulty swallowing. No voice changes. Denies chest pain. Episodic extremity swelling. Patient also complains of dental pain. Has yet to see a dentist. Patient notes a taking multiple ibuprofen at home and also complaints of upper abdominal discomfort and nausea. Thinks this is related to the ibuprofen use. Past Medical History:  Diagnosis Date  . Anxiety   . COPD (chronic obstructive pulmonary disease) ([place])   . GERD (gastroesophageal reflux disease)   . Hematuria   . Hyperlipidemia   . Hypertension   . Insomnia   . Nephrolithiasis   . Tobacco dependence     Patient Active Problem List   Diagnosis Date Noted  . COPD, very severe ([place]) 08/22/2015  . Chronic respiratory failure with hypercapnia ([place]) 06/08/2014  . Unspecified sleep apnea 06/08/2014  . Chronic diastolic heart failure ([place]) 05/11/2014  . Acute-on-chronic respiratory failure ([place]) 04/24/2014  . Sinusitis 04/13/2014  . Renal insufficiency 07/07/2012  . Hypertension 07/07/2012  . COPD exacerbation ([place]) 07/04/2012  . COPD, severe ([place]) 04/25/2012  . Sleep apnea 04/25/2012  . Obesity 04/25/2012    Past Surgical History:  Procedure Laterality Date  . LITHOTRIPSY         Home Medications    Prior to Admission medications   Medication Sig Start Date End Date Taking? Authorizing Provider  albuterol (PROVENTIL) (2.5 MG/3ML) 0.083% nebulizer  solution Take 3 mLs (2.5 mg total) by nebulization as needed for wheezing or shortness of breath. Patient taking differently: Take 2.5 mg by nebulization every 6 (six) hours as needed for wheezing or shortness of breath.  10/30/16  Yes Brand Males, MD  albuterol (VENTOLIN HFA) 108 (90 Base) MCG/ACT inhaler Inhale 2 puffs into the lungs every 4 (four) hours as needed for wheezing or shortness of breath. 09/05/16  Yes Brand Males, MD  ALPRAZolam Duanne Moron) 0.25 MG tablet Take 1 tablet (0.25 mg total) by mouth at bedtime as needed for anxiety. 12/11/16  Yes Brand Males, MD  bisoprolol (ZEBETA) 5 MG tablet Take 1 tablet (5 mg total) by mouth daily. Patient taking differently: Take 5 mg by mouth at bedtime.  05/05/14  Yes Corey Harold, NP  budesonide (PULMICORT) 0.5 MG/2ML nebulizer solution Take 2 mLs (0.5 mg total) by nebulization 2 (two) times daily. 08/22/15  Yes Brand Males, MD  Cinnamon 500 MG capsule Take 500 mg by mouth daily.   Yes Historical Provider, MD  cloNIDine (CATAPRES) 0.2 MG tablet Take 0.2 mg by mouth 3 (three) times daily.   Yes Historical Provider, MD  fluticasone (FLONASE) 50 MCG/ACT nasal spray Place 2 sprays into both nostrils daily.   Yes Historical Provider, MD  ipratropium (ATROVENT) 0.02 % nebulizer solution USE 1 VIAL IN NEBULIZER FOUR TIMES A DAY 08/27/16  Yes Brand Males, MD  ipratropium-albuterol (DUONEB) 0.5-2.5 (3) MG/3ML SOLN Take 3 mLs by nebulization 4 (four) times daily. 12/05/16  Yes Noralee Space, MD  losartan-hydrochlorothiazide (HYZAAR) 50-12.5 MG per tablet Take 1 tablet by mouth daily.   Yes Historical Provider, MD  montelukast (SINGULAIR) 10 MG tablet Take 10 mg by mouth at bedtime.   Yes Historical Provider, MD  omeprazole (PRILOSEC) 20 MG capsule Take 2 capsules (40 mg total) by mouth daily. For GERD. 07/09/12  Yes Erick Colace, NP  sodium chloride (OCEAN) 0.65 % SOLN nasal spray Place 2 sprays into both nostrils as needed for  congestion. Patient taking differently: Place 2 sprays into both nostrils daily as needed for congestion.  05/05/14  Yes Erick Colace, NP  cephALEXin (KEFLEX) 500 MG capsule Take 1 capsule (500 mg total) by mouth 3 (three) times daily. Patient not taking: Reported on 12/17/2016 10/30/16   Brand Males, MD  levofloxacin (LEVAQUIN) 750 MG tablet Take 1 tablet (750 mg total) by mouth daily. X 7 days 12/17/16   Julianne Rice, MD  loratadine (CLARITIN) 10 MG tablet Take 1 tablet (10 mg total) by mouth daily. 12/17/16   Julianne Rice, MD  ondansetron (ZOFRAN) 4 MG tablet Take 1 tablet (4 mg total) by mouth every 6 (six) hours. 12/17/16   Julianne Rice, MD  predniSONE (DELTASONE) 20 MG tablet 3 tabs po day one, then 2 po daily x 4 days 12/17/16   Julianne Rice, MD    Family History Family History  Problem Relation Age of Onset  . Heart disease Father   . Stroke Mother   . Leukemia Brother     Social History Social History  Substance Use Topics  . Smoking status: Former Smoker    Packs/day: 1.00    Years: 35.00    Types: Cigarettes    Quit date: 11/26/2010  . Smokeless tobacco: Not on file  . Alcohol use No     Allergies   Codeine and Prozac [fluoxetine hcl]   Review of Systems Review of Systems  Constitutional: Positive for chills and fatigue. Negative for fever.  HENT: Positive for congestion, dental problem, postnasal drip, rhinorrhea, sinus pain, sinus pressure, sore throat and trouble swallowing. Negative for drooling, ear discharge, ear pain and voice change.   Eyes: Negative for photophobia and visual disturbance.  Respiratory: Positive for cough, shortness of breath and wheezing. Negative for chest tightness.   Cardiovascular: Positive for leg swelling. Negative for chest pain and palpitations.  Gastrointestinal: Positive for abdominal pain and nausea. Negative for diarrhea and vomiting.  Genitourinary: Negative for dysuria, flank pain and frequency.   Musculoskeletal: Negative for back pain, joint swelling, neck pain and neck stiffness.  Skin: Negative for rash and wound.  Neurological: Negative for dizziness, syncope, weakness, numbness and headaches.  All other systems reviewed and are negative.    Physical Exam Updated Vital Signs BP 120/63   Pulse 88   Temp 98.1 F (36.7 C) (Oral)   Resp 23   SpO2 96%   Physical Exam  Constitutional: He is oriented to person, place, and time. He appears well-developed and well-nourished. No distress.  HENT:  Head: Normocephalic and atraumatic.  Mouth/Throat: Oropharynx is clear and moist.  Very poor dentition. Erythematous gingiva with no appreciated masses. There is percussion over bilateral frontal and maxillary sinuses. Bilateral nasal mucosal edema. Oropharynx is erythematous without tonsillar exudate. Uvula is midline.  Eyes: EOM are normal. Pupils are equal, round, and reactive to light.  Neck: Normal range of motion. Neck supple. No JVD present.  Cardiovascular: Normal rate and regular rhythm.  Exam reveals no gallop and no friction rub.  No murmur heard. Pulmonary/Chest: Effort normal. He has wheezes.  Decreased breath sound throughout. Occasional inspiratory wheeze.  Abdominal: Soft. Bowel sounds are normal. There is tenderness (mild epigastric tenderness to palpation.). There is no rebound and no guarding.  Musculoskeletal: Normal range of motion. He exhibits edema. He exhibits no tenderness.  2+ edema to the right lower extremity. 1+ edema left lower extremity. 2+ dorsalis pedis and posterior tibial pulses.  Lymphadenopathy:    He has no cervical adenopathy.  Neurological: He is alert and oriented to person, place, and time.  Moving all extremities without deficit. Sensation intact.  Skin: Skin is warm and dry. Capillary refill takes less than 2 seconds. No rash noted. No erythema.  Psychiatric: He has a normal mood and affect. His behavior is normal.  Nursing note and vitals  reviewed.    ED Treatments / Results  Labs (all labs ordered are listed, but only abnormal results are displayed) Labs Reviewed  COMPREHENSIVE METABOLIC PANEL - Abnormal; Notable for the following:       Result Value   Glucose, Bld 184 (*)    Calcium 8.5 (*)    AST 88 (*)    ALT 76 (*)    All other components within normal limits  CBC WITH DIFFERENTIAL/PLATELET - Abnormal; Notable for the following:    WBC 28.7 (*)    Lymphs Abs 19.8 (*)    Monocytes Absolute 1.7 (*)    All other components within normal limits  URINALYSIS, ROUTINE W REFLEX MICROSCOPIC - Abnormal; Notable for the following:    Leukocytes, UA MODERATE (*)    Squamous Epithelial / LPF 0-5 (*)    All other components within normal limits  LIPASE, BLOOD - Abnormal; Notable for the following:    Lipase 98 (*)    All other components within normal limits  RAPID STREP SCREEN (NOT AT [facility])  CULTURE, GROUP A STREP ([place])  INFLUENZA PANEL BY PCR (TYPE A & B)  BRAIN NATRIURETIC PEPTIDE  PATHOLOGIST SMEAR REVIEW  I-STAT CG4 LACTIC ACID, ED  I-STAT TROPOININ, ED  I-STAT CG4 LACTIC ACID, ED    EKG  EKG Interpretation  Date/Time:  Monday December 17 2016 08:16:55 EST Ventricular Rate:  76 PR Interval:    QRS Duration: 163 QT Interval:  443 QTC Calculation: 499 R Axis:   128 Text Interpretation:  Normal sinus rhythm Paired ventricular premature complexes Nonspecific intraventricular conduction delay Baseline wander in lead(s) V1 V3 Confirmed by Winfred Leeds  MD, SAM [id number]) on 12/17/2016 8:22:00 AM       Radiology Dg Chest 2 View  Result Date: 12/17/2016 CLINICAL DATA:  Cough and congestion.  Shortness of breath. EXAM: CHEST  2 VIEW COMPARISON:  May 02, 2014 FINDINGS: There is no edema or consolidation. The heart size is upper normal with pulmonary vascularity within normal limits. No adenopathy. There is degenerative change in the thoracic spine. IMPRESSION: No edema or consolidation. Electronically Signed   By:  Lowella Grip III M.D.   On: 12/17/2016 08:39   US Abdomen Limited Ruq  Result Date: 12/17/2016 CLINICAL DATA:  One week of upper abdominal pain and nausea. History of gastroesophageal reflux. EXAM: US ABDOMEN LIMITED - RIGHT UPPER QUADRANT COMPARISON:  Abdominal ultrasound of May 21, 2013 FINDINGS: Gallbladder: The gallbladder is adequately distended. Previously demonstrated gallstones are not evident today. There is no gallbladder wall thickening, pericholecystic fluid, or positive sonographic Murphy's sign. Common bile duct: Diameter: 5 mm Liver: The hepatic echotexture is mildly increased diffusely. There are  cysts present in both the right and left lobes which have been previously demonstrated. The largest lies in the left lobe and measures 5.9 x 3.7 x 5.7 cm and has a bilobed configuration. The smaller right lobe lesions measure 1.8 and 0.9 cm in greatest dimension. There is no intrahepatic ductal dilation or evidence of solid masses. IMPRESSION: No gallstones or sonographic evidence of acute cholecystitis. Given the patient's symptoms, a nuclear medicine hepatobiliary scan with gallbladder ejection fraction determination would be useful to exclude gallbladder dysfunction. Stable simple appearing hepatic cysts. Probable fatty infiltrative change of the liver. Electronically Signed   By: Kodey Xue  [place] M.D.   On: 12/17/2016 13:09    Procedures Procedures (including critical care time)  Medications Ordered in ED Medications  albuterol (PROVENTIL) (2.5 MG/3ML) 0.083% nebulizer solution 5 mg (5 mg Nebulization Given 12/17/16 0906)  methylPREDNISolone sodium succinate (SOLU-MEDROL) 125 mg/2 mL injection 125 mg (125 mg Intravenous Given 12/17/16 1037)  albuterol (PROVENTIL) (2.5 MG/3ML) 0.083% nebulizer solution 5 mg (5 mg Nebulization Given 12/17/16 1047)  gi cocktail (Maalox,Lidocaine,Donnatal) (30 mLs Oral Given 12/17/16 1041)  ondansetron (ZOFRAN) injection 4 mg (4 mg Intravenous Given 12/17/16  1035)     Initial Impression / Assessment and Plan / ED Course  I have reviewed the triage vital signs and the nursing notes.  Pertinent labs & imaging results that were available during my care of the patient were reviewed by me and considered in my medical decision making (see chart for details).    Patient's vital signs remained stable. He is in no respiratory distress. He is mostly complaining of upper respiratory symptoms. His abdominal exam is benign. There is no rebound or guarding. Patient has elevation in white blood cell count of uncertain significance. He has mild liver function tests and lipase elevation. Ultrasound of the gallbladder without any evidence of cholecystitis or biliary tract obstruction. We'll start on antibiotics for presumed sinus/respiratory infection. Patient understands the need to follow-up with his primary physician as well as with a dentist. Return precautions have been given. Patient is voiced understanding.   Final Clinical Impressions(s) / ED Diagnoses   Final diagnoses:  Acute maxillary sinusitis, recurrence not specified  Leukocytosis, unspecified type  Elevated liver function tests    New Prescriptions New Prescriptions   LEVOFLOXACIN (LEVAQUIN) 750 MG TABLET    Take 1 tablet (750 mg total) by mouth daily. X 7 days   LORATADINE (CLARITIN) 10 MG TABLET    Take 1 tablet (10 mg total) by mouth daily.   ONDANSETRON (ZOFRAN) 4 MG TABLET    Take 1 tablet (4 mg total) by mouth every 6 (six) hours.   PREDNISONE (DELTASONE) 20 MG TABLET    3 tabs po day one, then 2 po daily x 4 days     Julianne Rice, MD 12/17/16 1335

## 2016-12-17 NOTE — ED Notes (Signed)
Bed: [id number] Expected date:  Expected time:  Means of arrival:  Comments: TRIAGE 4

## 2016-12-17 NOTE — ED Notes (Signed)
Patient is up for discharge. I went into room to remove patient's IV, patient stated "I need something for pain, my head is going to explode." I exited the room and alerted RN Alaina. Alaina looked at chart, I alerted [employer]. Caryl Pina RN stated "she had already consulted EDP [place] about patient's pain. Per Dr Lita Mains pattient is to be discharged and to take Tylenol or Ibuprofen when he gets home. RN Caryl Pina went into room to explain all of this to patient. Patient became upset, in pain. Caryl Pina brought patient sandwich and sprite. Patient asking for family member to go get Tylenol before he leaves.

## 2016-12-17 NOTE — ED Notes (Signed)
1ST SET OF CULTURES IN [facility]I LAB

## 2016-12-17 NOTE — ED Notes (Signed)
At discharge pt's requesting something for headache. EDP [place] made aware. No orders given. EDP stated twice Pt can take tylenol and motrin can be taken at home. His complaints had been addressed. Agreed to give apple sauce and sandwich and pt can eat in wheelchair while going over discharge instructions. Pt allowed to stay in triage and we would assist pt into car.  Wife present and encourage to ask any questions.Janetta Hora NT witnessed conversation. Kristen RN discharged pt

## 2016-12-19 LAB — CULTURE, GROUP A STREP (THRC)

## 2017-01-08 DIAGNOSIS — Z125 Encounter for screening for malignant neoplasm of prostate: Secondary | ICD-10-CM | POA: Diagnosis not present

## 2017-01-08 DIAGNOSIS — I10 Essential (primary) hypertension: Secondary | ICD-10-CM | POA: Diagnosis not present

## 2017-01-08 DIAGNOSIS — R739 Hyperglycemia, unspecified: Secondary | ICD-10-CM | POA: Diagnosis not present

## 2017-01-08 DIAGNOSIS — J449 Chronic obstructive pulmonary disease, unspecified: Secondary | ICD-10-CM | POA: Diagnosis not present

## 2017-01-09 DIAGNOSIS — J449 Chronic obstructive pulmonary disease, unspecified: Secondary | ICD-10-CM | POA: Diagnosis not present

## 2017-01-31 ENCOUNTER — Telehealth: Payer: Self-pay | Admitting: Internal Medicine

## 2017-01-31 MED ORDER — IPRATROPIUM BROMIDE 0.02 % IN SOLN: RESPIRATORY_TRACT | 6 refills | Status: DC

## 2017-01-31 NOTE — Telephone Encounter (Signed)
lmomtcb to make the pt aware that rx for the ipratropium has been sent to his pharmacy. Nothing further is needed.

## 2017-02-06 DIAGNOSIS — J449 Chronic obstructive pulmonary disease, unspecified: Secondary | ICD-10-CM | POA: Diagnosis not present

## 2017-02-11 ENCOUNTER — Telehealth: Payer: Self-pay | Admitting: Internal Medicine

## 2017-02-11 NOTE — Telephone Encounter (Signed)
Attempted to contact pt. Line rang busy x2. Will try back. 

## 2017-02-11 NOTE — Telephone Encounter (Signed)
Spoke with pt. While on the phone with him he sounded intoxicated. Pt could not tell me what medications he needed refilled. He continued stating, "Uh.Uh.Uh." Advised pt to contact us back when he knows what medications he needs. Nothing further was needed.

## 2017-02-11 NOTE — Telephone Encounter (Deleted)
Per MR -  Doxycycline 100mg  BID x 5 days.  Pred taper for 5 days.

## 2017-02-11 NOTE — Telephone Encounter (Signed)
Spoke with pt's wife. States that pt is not feeling well. Reports having a sinus infection. Denies any pulmonary symptoms >> SOB, chest tightness, wheezing or coughing. Tried to advised pt's wife that they should contact pt's PCP since pt is not having any pulmonary symptoms. She argued and stated, "Dr. Chase Caller tells [place] to always call him no matter what." Pt's wife wants something called in..  MR - please advise. Thanks.

## 2017-02-11 NOTE — Telephone Encounter (Addendum)
Per MR -  Doxy 100mg  BID x 5 days and 5 day pred taper.    LMTCB for pt's wife.

## 2017-02-11 NOTE — Telephone Encounter (Signed)
Patient wife is asking that we call her back - Her name is Brenton Grills - She can be reached at [phone] -pr

## 2017-02-12 MED ORDER — DOXYCYCLINE HYCLATE 100 MG PO TABS: ORAL_TABLET | ORAL | 0 refills | Status: DC

## 2017-02-12 MED ORDER — PREDNISONE 10 MG PO TABS: ORAL_TABLET | ORAL | 0 refills | Status: DC

## 2017-02-12 MED ORDER — IPRATROPIUM BROMIDE 0.02 % IN SOLN: RESPIRATORY_TRACT | 6 refills | Status: DC

## 2017-02-12 NOTE — Telephone Encounter (Signed)
Spoke with pt's wife Brenton Grills, aware of recs.  rx's sent to preferred pharmacy.  Nothing further needed.

## 2017-02-12 NOTE — Telephone Encounter (Signed)
Patient wife Brenton Grills called back - she can be reached at [phone] -pr

## 2017-02-12 NOTE — Telephone Encounter (Signed)
Patient returning phone call 

## 2017-02-12 NOTE — Telephone Encounter (Signed)
lmtcb for pt wife.  

## 2017-02-18 ENCOUNTER — Other Ambulatory Visit: Payer: Self-pay | Admitting: Internal Medicine

## 2017-03-02 ENCOUNTER — Other Ambulatory Visit: Payer: Self-pay | Admitting: Pulmonary Disease

## 2017-03-09 DIAGNOSIS — J449 Chronic obstructive pulmonary disease, unspecified: Secondary | ICD-10-CM | POA: Diagnosis not present

## 2017-03-12 ENCOUNTER — Ambulatory Visit: Payer: 59 | Admitting: Internal Medicine

## 2017-03-20 ENCOUNTER — Telehealth: Payer: Self-pay | Admitting: Internal Medicine

## 2017-03-20 NOTE — Telephone Encounter (Signed)
lmtcb for pt's wife 

## 2017-03-21 MED ORDER — BUDESONIDE 0.5 MG/2ML IN SUSP: 0.5000 mg | Freq: Two times a day (BID) | RESPIRATORY_TRACT | 5 refills | Status: DC

## 2017-03-21 NOTE — Telephone Encounter (Signed)
Patient wife returning call - she can be reached at [phone] -pr

## 2017-03-21 NOTE — Telephone Encounter (Signed)
Called spoke with patient's spouse Drenda Freeze and verified that pt is needing a refill on his Budeosnide 0.5mg  neb soln through [facility] local [facility]  Last ov 09/05/16, upcoming ov 5.8.18 Rx sent Nothing further needed; will sign off

## 2017-04-02 ENCOUNTER — Ambulatory Visit: Payer: 59 | Admitting: Internal Medicine

## 2017-04-08 DIAGNOSIS — J449 Chronic obstructive pulmonary disease, unspecified: Secondary | ICD-10-CM | POA: Diagnosis not present

## 2017-04-09 ENCOUNTER — Other Ambulatory Visit: Payer: Self-pay | Admitting: Internal Medicine

## 2017-04-10 NOTE — Telephone Encounter (Signed)
Received refill request for pt's albuterol neb. Pt is already on Duoneb q4h. Per pt's chart, he was not on albuterol neb prn during last OV on 09/05/2016.   MR, please advise. Thanks.

## 2017-04-10 NOTE — Telephone Encounter (Signed)
Ok to do albuterol prn  Dr. Brand Males, M.D., [facility].C.P Pulmonary and Critical Care Medicine Staff Physician [place] [facility] Pager: [phone], If no answer or between  15:00h - 7:00h: call [phone]  04/10/2017 2:05 PM

## 2017-04-10 NOTE — Telephone Encounter (Signed)
This was filled. Will close this message

## 2017-04-30 ENCOUNTER — Encounter: Payer: Self-pay | Admitting: Internal Medicine

## 2017-04-30 ENCOUNTER — Ambulatory Visit (INDEPENDENT_AMBULATORY_CARE_PROVIDER_SITE_OTHER): Payer: 59 | Admitting: Internal Medicine

## 2017-04-30 VITALS — BP 110/64 | HR 95 | Ht 71.0 in | Wt 297.0 lb

## 2017-04-30 DIAGNOSIS — B370 Candidal stomatitis: Secondary | ICD-10-CM | POA: Diagnosis not present

## 2017-04-30 DIAGNOSIS — J449 Chronic obstructive pulmonary disease, unspecified: Secondary | ICD-10-CM

## 2017-04-30 DIAGNOSIS — J9612 Chronic respiratory failure with hypercapnia: Secondary | ICD-10-CM

## 2017-04-30 MED ORDER — ALBUTEROL SULFATE HFA 108 (90 BASE) MCG/ACT IN AERS: 2.0000 | INHALATION_SPRAY | RESPIRATORY_TRACT | 6 refills | Status: DC | PRN

## 2017-04-30 NOTE — Progress Notes (Signed)
Subjective:     Patient ID: Isaac [name], male   DOB: [birth year], 61 y.o.   MRN: [id number]  HPI    #Obesity Body mass index is 39.91 kg/(m^2). on 03/26/2013  #Chronic sinusitis T sinus 2006   - IMPRESSION:  1. Ethmoid and frontal sinus dise Case. Sphenoid and maxillary sinuses are clear.  2. Patent ostiomeatal complexes.  #Smoking history  reports that he quit smoking about 2 years ago. His smoking use included Cigarettes. He has a 35 pack-year smoking history. He does not have any smokeless tobacco history on file.  #Multifactorial dyspnea -  Nuclear med stress test  2013 -  - negative per hx. Done by Dr Einar Gip per hx  #COPD - Severe/very severe COPD Spirometry 12/31/11 done at [place]- fev1 0.8L/21%, Rati 39, 23% BD response on fev1 May 2013: Walk test in office : Pulse 88% at rest -> 1 lap x 185 feet - pulse ox 88% and HR 110 and very dyspneic, so stopped - Status post pulmonary rehabilitation fall 2013   #Evaluation for lung cancer : CXR 12/24/11   Clear lung fields - personally revieweed  #Recurrent COPD - May 2013: Outpatient treatment with doxycycline and prednisone - August 2013: Hospitalization for COPD exacerbation - January 2014: Outpatient treatment with antibiotics and prednisone - March 2015 : telephoine Rx with sinusitis, abx and prednisone - April 2015: telephoine Rx with abx and prednisone - June 2014 - VDRF with admission to ICU.        OV 06/08/2014  Chief Complaint  Patient presents with  . Follow-up    Pt states since using his neb meds his breathing has improved.C/o DOE. Denies CP/tightness and cough.    Followup from recent ICU hospitalization for respiratory failure. At this point in time he is significantly improved. He used to be bothered by significant sinus blockage for which he had an extensive workup including MRI and MRA but apparently after he started using nebulizers instead of inhalers this symptom has resolved. He still physically  deconditioned. He did have home physical therapy but he found him down an operative go self-directed exercises but after my counseling today he is open to reinitiating home physical therapy. This is because he still physically deconditioned. He uses oxygen and nebulizers on a compliant fashion. PFTs today show significant severe Gold stage IV lung disease with FEV1 of 0.92/24% and a ratio of 42. Total lung capacity of 122% and a DLCO that significantly impaired at 8.8/26%.  Labs reviewed June 2040 in the hospital at discharge he was anemic with a hemoglobin of 10 g percent. This has not been followed up. His chemistries were normal.  Past medical history: He did have a sleep study over the weekend. I do not know the results. This was ordered by Dr. Halford Chessman and I will set up followup for this to Dr. Jaclyn Prime 08/22/2015  Chief Complaint  Patient presents with  . Follow-up    Pt states his breathing was worse over the summer because of the heat and humidity. Pt c/o night time prod cough with white mucus, right ear pressure, intermittent sinus pressure. Pt denies CP/tightness.    Follow-up chronic respiratory failure with hypoxemia and hypercapnia associated with Gold stage IV COPD and morbid obesity.  In early 2015 he had life-threatening hospitalization from respiratory failure. Last visit in the office was July 2015. After that he failed to follow-up. He now presents with his wife. They want refills of his nebulizers and  wants to up-to-date himself with the vaccines. The last 1 year they deny any hospitalizations or emergency room visits or urgent care visits or new medical diagnoses. He is mostly sedentary and is house sitting but he does do limited activities of daily living such as changing clothes and self-feed. He did travel to the beach but he stayed in the hotel in the beach. Currently he does have some mild baseline cough with yellow sputum but this is unchanged compared to baseline. There are no  new issues.   OV 09/05/2016  Chief Complaint  Patient presents with  . Follow-up    Pt breathing is feels slightly worst due to weather. Breathing has changed since last seen. Uses nebulizer after excertion. No tightness, or congestion today. Coughing is sometimes dry and productive. coughing up clear mucus. Discuss flu shot, handy cap plaquer, and rescue inhaler.     Follow-up chronic approximately possibly hypercapnic respiratory failure from Gold stage IV COPD and morbid obesity and sleep apnea ruled out per history  Last seen one year ago. He was supposed to come back in 6 months but did not. Review of the chart suggests that he did not have any hospitalizations or emergency room visits. He and his wife confirmed that. He is extremely sedentary. He spends most of his day in bed playing video games and surfing the Internet. He has the ability to walk from room to room but is limited because of shortness of breath. He requests some help with activities of daily living such as changing clothes or taking a bath. Overall stable. He is asking for an increase in his nebulizer frequency. His upcoming oral surgery evaluation and might require anesthesia for it. Is requesting flu shot.   OV 04/30/2017  Chief Complaint  Patient presents with  . Follow-up    FOLLOW UP FOR Chronic respiratory failure with hyercapnia, COPD , severe     Follow-up chronic approximately possibly hypercapnic respiratory failure from Gold stage IV COPD and morbid obesity and sleep apnea ruled out per history   Routine follow-up for this patient with the above medical problems. Overall is doing well. His wife is here with him. There are no new exacerbations or admissions. His son got married in the interim. He did fine with his oral surgery and is now feeling better. He complains of occasional mucus being stuck in his chest but chest x-ray earlier this year was clear. Uses pure mist and able to clear the mucus     has a  past medical history of Anxiety; COPD (chronic obstructive pulmonary disease) ([place]); GERD (gastroesophageal reflux disease); Hematuria; Hyperlipidemia; Hypertension; Insomnia; Nephrolithiasis; and Tobacco dependence.   reports that he quit smoking about 6 years ago. His smoking use included Cigarettes. He has a 35.00 pack-year smoking history. He has never used smokeless tobacco.  Past Surgical History:  Procedure Laterality Date  . LITHOTRIPSY      Allergies  Allergen Reactions  . Codeine Itching    "jittery"  . Prozac [Fluoxetine Hcl]     confusion    Immunization History  Administered Date(s) Administered  . Influenza Split 09/27/2011, 08/20/2012, 07/27/2013  . Influenza,inj,Quad PF,36+ Mos 08/22/2015, 10/26/2016  . Pneumococcal Conjugate-13 08/22/2015  . Pneumococcal Polysaccharide-23 07/23/2012    Family History  Problem Relation Age of Onset  . Heart disease Father   . Stroke Mother   . Leukemia Brother      Current Outpatient Prescriptions:  .  albuterol (PROVENTIL) (2.5 MG/3ML) 0.083% nebulizer solution, Take  3 mLs (2.5 mg total) by nebulization as needed for wheezing or shortness of breath. (Patient taking differently: Take 2.5 mg by nebulization every 6 (six) hours as needed for wheezing or shortness of breath. ), Disp: 1080 mL, Rfl: 1 .  albuterol (PROVENTIL) (2.5 MG/3ML) 0.083% nebulizer solution, USE 1 VIAL IN NEBULIZER AS NEEDED FOR WHEEZING/SHORTNESS OF BREATH, Disp: 360 mL, Rfl: 2 .  albuterol (VENTOLIN HFA) 108 (90 Base) MCG/ACT inhaler, Inhale 2 puffs into the lungs every 4 (four) hours as needed for wheezing or shortness of breath., Disp: 1 Inhaler, Rfl: 6 .  bisoprolol (ZEBETA) 5 MG tablet, Take 1 tablet (5 mg total) by mouth daily. (Patient taking differently: Take 5 mg by mouth at bedtime. ), Disp: 30 tablet, Rfl: 3 .  budesonide (PULMICORT) 0.5 MG/2ML nebulizer solution, Take 2 mLs (0.5 mg total) by nebulization 2 (two) times daily., Disp: 120 mL, Rfl:  5 .  Cinnamon 500 MG capsule, Take 500 mg by mouth daily., Disp: , Rfl:  .  cloNIDine (CATAPRES) 0.2 MG tablet, Take 0.2 mg by mouth 3 (three) times daily., Disp: , Rfl:  .  fluticasone (FLONASE) 50 MCG/ACT nasal spray, Place 2 sprays into both nostrils daily., Disp: , Rfl:  .  ipratropium (ATROVENT) 0.02 % nebulizer solution, USE 1 VIAL IN NEBULIZER FOUR TIMES A DAY, Disp: 360 mL, Rfl: 6 .  ipratropium (ATROVENT) 0.02 % nebulizer solution, TAKE 1 VIAL IN NEBULIZER 4 TIMES A DAY, Disp: 687.5 mL, Rfl: 0 .  ipratropium-albuterol (DUONEB) 0.5-2.5 (3) MG/3ML SOLN, USE 1 VIAL IN NEBULIZER 4 TIMES A DAY, Disp: 360 mL, Rfl: 2 .  levofloxacin (LEVAQUIN) 750 MG tablet, Take 1 tablet (750 mg total) by mouth daily. X 7 days, Disp: 7 tablet, Rfl: 0 .  loratadine (CLARITIN) 10 MG tablet, Take 1 tablet (10 mg total) by mouth daily., Disp: 30 tablet, Rfl: 0 .  losartan-hydrochlorothiazide (HYZAAR) 50-12.5 MG per tablet, Take 1 tablet by mouth daily., Disp: , Rfl:  .  montelukast (SINGULAIR) 10 MG tablet, Take 10 mg by mouth at bedtime., Disp: , Rfl:  .  omeprazole (PRILOSEC) 20 MG capsule, Take 2 capsules (40 mg total) by mouth daily. For GERD., Disp: 30 capsule, Rfl: 6 .  ondansetron (ZOFRAN) 4 MG tablet, Take 1 tablet (4 mg total) by mouth every 6 (six) hours., Disp: 12 tablet, Rfl: 0 .  predniSONE (DELTASONE) 20 MG tablet, 3 tabs po day one, then 2 po daily x 4 days, Disp: 11 tablet, Rfl: 0 .  sodium chloride (OCEAN) 0.65 % SOLN nasal spray, Place 2 sprays into both nostrils as needed for congestion. (Patient taking differently: Place 2 sprays into both nostrils daily as needed for congestion. ), Disp: , Rfl: 0 .  ALPRAZolam (XANAX) 0.25 MG tablet, Take 1 tablet (0.25 mg total) by mouth at bedtime as needed for anxiety. (Patient not taking: Reported on 04/30/2017), Disp: 7 tablet, Rfl: 0 .  cephALEXin (KEFLEX) 500 MG capsule, Take 1 capsule (500 mg total) by mouth 3 (three) times daily. (Patient not taking:  Reported on 12/17/2016), Disp: 15 capsule, Rfl: 0 .  doxycycline (VIBRA-TABS) 100 MG tablet, Take 1 tablet, twice daily for 5 days. Avoid sunlight and take after meals. (Patient not taking: Reported on 04/30/2017), Disp: 10 tablet, Rfl: 0 .  predniSONE (DELTASONE) 10 MG tablet, 40 mg x1 day, then 30 mg x1 day, then 20 mg x1 day, then 10 mg x1 day, and then 5 mg x1 day and stop (Patient not  taking: Reported on 04/30/2017), Disp: 11 tablet, Rfl: 0   Review of Systems     Objective:   Physical Exam Vitals:   04/30/17 1704  BP: 110/64  Pulse: 95  SpO2: 96%  Weight: 297 lb (134.7 kg)  Height: 5\' 11"  (1.803 m)    Estimated body mass index is 41.42 kg/m as calculated from the following:   Height as of this encounter: 5\' 11"  (1.803 m).   Weight as of this encounter: 297 lb (134.7 kg).  Obese male sitting on the wheelchair Oxygen on Has a beard Has new oral thrush Mallampati class IV Abdomen soft No wheezing overall diminished air entry Normal heart sounds Chronic venous stasis edema present Skin is dry Alert and oriented 3    Assessment:       ICD-9-CM ICD-10-CM   1. Chronic respiratory failure with hypercapnia (HCC) 518.83 J96.12   2. COPD, very severe ([place]) 496 J44.9   3. Oral thrush 112.0 B37.0        Plan:      #COPD Stable disease Jan 2018 cxr clear  Plan - continue duoneb to to q4h (6 times daily) - continue pulmicort nebs twice daily  - continue singulair daily - refill ventolin albuterol mdi for prn use - try pure mist or mucinex for sputum clearance  ## Oral thrush - For Oral thrush: Take Suspension (swish and swallow): 500,000 units 4 times/day for 5 days; swish in the mouth and retain for as long as possible (several minutes) before swallowing   Followup 6 months or sooner if needed  Dr. Brand Males, M.D., [facility].C.P Pulmonary and Critical Care Medicine Staff Physician [facility] Pager: [phone], If  no answer or between  15:00h - 7:00h: call [phone]  04/30/2017 5:28 PM

## 2017-04-30 NOTE — Patient Instructions (Addendum)
ICD-9-CM ICD-10-CM   1. Chronic respiratory failure with hypercapnia (HCC) 518.83 J96.12   2. COPD, very severe ([place]) 496 J44.9   3. Oral thrush 112.0 B37.0     #COPD Stable disease Jan 2018 cxr clear  Plan - continue duoneb to to q4h (6 times daily) - continue pulmicort nebs twice daily  - continue singulair daily - refill ventolin albuterol mdi for prn use - try pure mist or mucinex for sputum clearance  ## Oral thrush - For Oral thrush: Take Suspension (swish and swallow): 500,000 units 4 times/day for 5 days; swish in the mouth and retain for as long as possible (several minutes) before swallowing   Followup 6 months or sooner if needed

## 2017-05-01 ENCOUNTER — Telehealth: Payer: Self-pay | Admitting: Internal Medicine

## 2017-05-01 NOTE — Telephone Encounter (Signed)
Left message for patient to call back tomorrow.   Per the AVS on 04/30/17,   Plan - continue duoneb to to q4h (6 times daily) - continue pulmicort nebs twice daily  - continue singulair daily - refill ventolin albuterol mdi for prn use - try pure mist or mucinex for sputum clearance   ## Oral thrush - For Oral thrush: Take Suspension (swish and swallow): 500,000 units 4 times/day for 5 days; swish in the mouth and retain for as long as possible (several minutes) before swallowing     Followup 6 months or sooner if needed

## 2017-05-02 MED ORDER — NYSTATIN 100000 UNIT/ML MT SUSP: 5.0000 mL | Freq: Four times a day (QID) | OROMUCOSAL | 0 refills | Status: DC

## 2017-05-02 NOTE — Telephone Encounter (Signed)
Rx has been sent in per CY. I have left a message on the pt's wife's named voicemail letting her know that this has been sent in. Nothing further was needed.

## 2017-05-02 NOTE — Telephone Encounter (Signed)
Wife Drenda Freeze) returned phone call [phone].Marland Kitchenert

## 2017-05-02 NOTE — Telephone Encounter (Signed)
Script would be for nystatin suspension

## 2017-05-02 NOTE — Telephone Encounter (Signed)
Spoke with pt's wife. They are needing the prescription that MR was to give them at his appointment for thrush. There are instructions on the pt's AVS but the name of the medication is not listed.  CY - please advise as MR worked 11pm Elink last night. Thanks.  Plan - continue duoneb to to q4h (6 times daily) - continue pulmicort nebs twice daily  - continue singulair daily - refill ventolin albuterol mdi for prn use - try pure mist or mucinex for sputum clearance  ## Oral thrush - For Oral thrush: Take Suspension (swish and swallow): 500,000 units 4 times/day for 5 days; swish in the mouth and retain for as long as possible (several minutes) before swallowing   Followup 6 months or sooner if needed  Allergies  Allergen Reactions  . Codeine Itching    "jittery"  . Prozac [Fluoxetine Hcl]     confusion   Current Outpatient Prescriptions on File Prior to Visit  Medication Sig Dispense Refill  . albuterol (PROVENTIL) (2.5 MG/3ML) 0.083% nebulizer solution Take 3 mLs (2.5 mg total) by nebulization as needed for wheezing or shortness of breath. (Patient taking differently: Take 2.5 mg by nebulization every 6 (six) hours as needed for wheezing or shortness of breath. ) 1080 mL 1  . albuterol (PROVENTIL) (2.5 MG/3ML) 0.083% nebulizer solution USE 1 VIAL IN NEBULIZER AS NEEDED FOR WHEEZING/SHORTNESS OF BREATH 360 mL 2  . albuterol (VENTOLIN HFA) 108 (90 Base) MCG/ACT inhaler Inhale 2 puffs into the lungs every 4 (four) hours as needed for wheezing or shortness of breath. 1 Inhaler 6  . ALPRAZolam (XANAX) 0.25 MG tablet Take 1 tablet (0.25 mg total) by mouth at bedtime as needed for anxiety. (Patient not taking: Reported on 04/30/2017) 7 tablet 0  . bisoprolol (ZEBETA) 5 MG tablet Take 1 tablet (5 mg total) by mouth daily. (Patient taking differently: Take 5 mg by mouth at bedtime. ) 30 tablet 3  . budesonide (PULMICORT) 0.5 MG/2ML nebulizer solution Take 2 mLs (0.5 mg total) by  nebulization 2 (two) times daily. 120 mL 5  . cephALEXin (KEFLEX) 500 MG capsule Take 1 capsule (500 mg total) by mouth 3 (three) times daily. (Patient not taking: Reported on 12/17/2016) 15 capsule 0  . Cinnamon 500 MG capsule Take 500 mg by mouth daily.    . cloNIDine (CATAPRES) 0.2 MG tablet Take 0.2 mg by mouth 3 (three) times daily.    Marland Kitchen doxycycline (VIBRA-TABS) 100 MG tablet Take 1 tablet, twice daily for 5 days. Avoid sunlight and take after meals. (Patient not taking: Reported on 04/30/2017) 10 tablet 0  . fluticasone (FLONASE) 50 MCG/ACT nasal spray Place 2 sprays into both nostrils daily.    Marland Kitchen ipratropium (ATROVENT) 0.02 % nebulizer solution USE 1 VIAL IN NEBULIZER FOUR TIMES A DAY 360 mL 6  . ipratropium (ATROVENT) 0.02 % nebulizer solution TAKE 1 VIAL IN NEBULIZER 4 TIMES A DAY 687.5 mL 0  . ipratropium-albuterol (DUONEB) 0.5-2.5 (3) MG/3ML SOLN USE 1 VIAL IN NEBULIZER 4 TIMES A DAY 360 mL 2  . levofloxacin (LEVAQUIN) 750 MG tablet Take 1 tablet (750 mg total) by mouth daily. X 7 days 7 tablet 0  . loratadine (CLARITIN) 10 MG tablet Take 1 tablet (10 mg total) by mouth daily. 30 tablet 0  . losartan-hydrochlorothiazide (HYZAAR) 50-12.5 MG per tablet Take 1 tablet by mouth daily.    . montelukast (SINGULAIR) 10 MG tablet Take 10 mg by mouth at bedtime.    Marland Kitchen omeprazole (  PRILOSEC) 20 MG capsule Take 2 capsules (40 mg total) by mouth daily. For GERD. 30 capsule 6  . ondansetron (ZOFRAN) 4 MG tablet Take 1 tablet (4 mg total) by mouth every 6 (six) hours. 12 tablet 0  . predniSONE (DELTASONE) 10 MG tablet 40 mg x1 day, then 30 mg x1 day, then 20 mg x1 day, then 10 mg x1 day, and then 5 mg x1 day and stop (Patient not taking: Reported on 04/30/2017) 11 tablet 0  . predniSONE (DELTASONE) 20 MG tablet 3 tabs po day one, then 2 po daily x 4 days 11 tablet 0  . sodium chloride (OCEAN) 0.65 % SOLN nasal spray Place 2 sprays into both nostrils as needed for congestion. (Patient taking differently: Place  2 sprays into both nostrils daily as needed for congestion. )  0   No current facility-administered medications on file prior to visit.

## 2017-05-09 ENCOUNTER — Telehealth: Payer: Self-pay | Admitting: Internal Medicine

## 2017-05-09 DIAGNOSIS — J9620 Acute and chronic respiratory failure, unspecified whether with hypoxia or hypercapnia: Secondary | ICD-10-CM | POA: Diagnosis not present

## 2017-05-09 DIAGNOSIS — J449 Chronic obstructive pulmonary disease, unspecified: Secondary | ICD-10-CM | POA: Diagnosis not present

## 2017-05-09 MED ORDER — AMOXICILLIN-POT CLAVULANATE 875-125 MG PO TABS: 1.0000 | ORAL_TABLET | Freq: Two times a day (BID) | ORAL | 0 refills | Status: DC

## 2017-05-09 NOTE — Telephone Encounter (Signed)
Wife returned phone call..ert

## 2017-05-09 NOTE — Telephone Encounter (Signed)
Spoke with the pt's spouse and notified of recs  Rx was sent to [facility]

## 2017-05-09 NOTE — Telephone Encounter (Signed)
lmomtcb x1 

## 2017-05-09 NOTE — Telephone Encounter (Signed)
Spoke with pt's wife, Drenda Freeze. States that pt is not feeling well. Reports sinus congestion and sinus pain. Denies chest tightness, wheezing, coughing, SOB or fever. Symptoms started 2 days ago. Pt has been taking Mucinex DM and Mucinex Sinus with no relief. Would like something called in.  CY - please advise as MR is not available. Thanks.

## 2017-05-09 NOTE — Telephone Encounter (Signed)
augmentin 875,  # 20, 1 twice daily

## 2017-05-09 NOTE — Telephone Encounter (Signed)
lmtcb x1 for pt's wife. 

## 2017-05-14 ENCOUNTER — Telehealth: Payer: Self-pay | Admitting: Internal Medicine

## 2017-05-14 NOTE — Telephone Encounter (Signed)
Left message for patient to call back  

## 2017-05-15 NOTE — Telephone Encounter (Signed)
lmtcb x2 for pt. 

## 2017-05-16 NOTE — Telephone Encounter (Signed)
Await fax of jury summons.

## 2017-05-16 NOTE — Telephone Encounter (Signed)
Patient wife called back - advised that we need jury duty summons - pt wife to fax front and back - pt states if anything further is needed - she can be reached at [phone] -pr

## 2017-05-16 NOTE — Telephone Encounter (Signed)
lmomtcb x 3  

## 2017-05-17 NOTE — Telephone Encounter (Signed)
Jury summons has been received and is located in MR's look at. Spoke with pt's wife, Drenda Freeze. Made her aware that we received the fax and will contact her as soon as MR responds.  MR - please advise on jury duty letter. Thanks.

## 2017-05-20 NOTE — Telephone Encounter (Signed)
MR, please advise. Thanks!  

## 2017-05-22 NOTE — Telephone Encounter (Signed)
Please write a letter as follows  To Whom It May Concern  Isaac [name] 1956-08-14  With [address] [place] [address]  Cannot serve on jury dut he has extremely advanced lung disease associated with morbid obesity. He is extremely disabled and is essentially bedridden her chair ridden and is wheelchair-bound. He is physical or mental position to do jury duty. Any emotions experienced during jury duty will put him in the hospital straightaway with high risk of death. Therefore he is prohibited from doing jury duty

## 2017-05-23 ENCOUNTER — Encounter: Payer: Self-pay | Admitting: *Deleted

## 2017-05-23 NOTE — Telephone Encounter (Signed)
Letter has been printed out and I have called the pts wife and she is aware of letter being ready to be picked up.

## 2017-06-04 ENCOUNTER — Other Ambulatory Visit: Payer: Self-pay | Admitting: Internal Medicine

## 2017-06-05 ENCOUNTER — Other Ambulatory Visit: Payer: Self-pay | Admitting: Internal Medicine

## 2017-06-08 DIAGNOSIS — J449 Chronic obstructive pulmonary disease, unspecified: Secondary | ICD-10-CM | POA: Diagnosis not present

## 2017-06-08 DIAGNOSIS — J9620 Acute and chronic respiratory failure, unspecified whether with hypoxia or hypercapnia: Secondary | ICD-10-CM | POA: Diagnosis not present

## 2017-07-03 ENCOUNTER — Other Ambulatory Visit: Payer: Self-pay | Admitting: Internal Medicine

## 2017-07-09 DIAGNOSIS — J449 Chronic obstructive pulmonary disease, unspecified: Secondary | ICD-10-CM | POA: Diagnosis not present

## 2017-07-30 ENCOUNTER — Other Ambulatory Visit: Payer: Self-pay | Admitting: Internal Medicine

## 2017-08-09 DIAGNOSIS — J449 Chronic obstructive pulmonary disease, unspecified: Secondary | ICD-10-CM | POA: Diagnosis not present

## 2017-08-09 DIAGNOSIS — J9620 Acute and chronic respiratory failure, unspecified whether with hypoxia or hypercapnia: Secondary | ICD-10-CM | POA: Diagnosis not present

## 2017-08-23 ENCOUNTER — Telehealth: Payer: Self-pay | Admitting: Internal Medicine

## 2017-08-23 MED ORDER — DOXYCYCLINE HYCLATE 100 MG PO TABS: ORAL_TABLET | ORAL | 0 refills | Status: DC

## 2017-08-23 MED ORDER — PREDNISONE 10 MG PO TABS: ORAL_TABLET | ORAL | 0 refills | Status: DC

## 2017-08-23 NOTE — Telephone Encounter (Signed)
Patients wife states that MR has told patient to call our office for medications when he gets sinus issues since he is on O2.  Pt's wife requests the following (patient has had in June 2018 by MR):    doxycycline (VIBRA-TABS) 100 MG tablet, Take 1 tablet, twice daily for 5 days. Avoid sunlight and take after meals. (Patient not taking: Reported on 04/30/2017), Disp: 10 tablet, Rfl: 0 .  predniSONE (DELTASONE) 10 MG tablet, 40 mg x1 day, then 30 mg x1 day, then 20 mg x1 day, then 10 mg x1 day, and then 5 mg x1 day and stop (Patient not taking: Reported on 04/30/2017), Disp: 11 tablet, Rfl: 0

## 2017-08-23 NOTE — Telephone Encounter (Signed)
That plan if fine. Please send in both prescriptions. He should call us back if he is not improving or develops new symptoms.

## 2017-08-23 NOTE — Telephone Encounter (Signed)
Pt is aware that Rx's have been sent to pharmacy and nothing more needed at this time.    If patient does not improve or new symptoms develop pt will call the office.

## 2017-08-23 NOTE — Telephone Encounter (Signed)
Spoke with patient. He stated that he has been coughing and blowing his nose a lot since yesterday. Feels facial pressure. Described his nasal discharge as white with a yellowish tint. Denied any symptoms of SOB or chest pain.   Pt wishes to use [facility] on [address], please advise since MR is not in clinic. Thanks!

## 2017-09-03 ENCOUNTER — Other Ambulatory Visit: Payer: Self-pay | Admitting: Internal Medicine

## 2017-09-08 DIAGNOSIS — J449 Chronic obstructive pulmonary disease, unspecified: Secondary | ICD-10-CM | POA: Diagnosis not present

## 2017-09-08 DIAGNOSIS — J9620 Acute and chronic respiratory failure, unspecified whether with hypoxia or hypercapnia: Secondary | ICD-10-CM | POA: Diagnosis not present

## 2017-09-15 ENCOUNTER — Other Ambulatory Visit: Payer: Self-pay | Admitting: Internal Medicine

## 2017-10-07 ENCOUNTER — Other Ambulatory Visit: Payer: Self-pay | Admitting: Internal Medicine

## 2017-10-09 DIAGNOSIS — J9620 Acute and chronic respiratory failure, unspecified whether with hypoxia or hypercapnia: Secondary | ICD-10-CM | POA: Diagnosis not present

## 2017-10-09 DIAGNOSIS — J449 Chronic obstructive pulmonary disease, unspecified: Secondary | ICD-10-CM | POA: Diagnosis not present

## 2017-10-23 DIAGNOSIS — D72829 Elevated white blood cell count, unspecified: Secondary | ICD-10-CM | POA: Diagnosis not present

## 2017-10-23 DIAGNOSIS — R635 Abnormal weight gain: Secondary | ICD-10-CM | POA: Diagnosis not present

## 2017-10-23 DIAGNOSIS — Z0000 Encounter for general adult medical examination without abnormal findings: Secondary | ICD-10-CM | POA: Diagnosis not present

## 2017-10-23 DIAGNOSIS — R739 Hyperglycemia, unspecified: Secondary | ICD-10-CM | POA: Diagnosis not present

## 2017-10-23 DIAGNOSIS — I10 Essential (primary) hypertension: Secondary | ICD-10-CM | POA: Diagnosis not present

## 2017-11-05 ENCOUNTER — Other Ambulatory Visit: Payer: Self-pay | Admitting: Internal Medicine

## 2017-11-08 DIAGNOSIS — J449 Chronic obstructive pulmonary disease, unspecified: Secondary | ICD-10-CM | POA: Diagnosis not present

## 2017-11-08 DIAGNOSIS — J9620 Acute and chronic respiratory failure, unspecified whether with hypoxia or hypercapnia: Secondary | ICD-10-CM | POA: Diagnosis not present

## 2017-11-13 ENCOUNTER — Telehealth: Payer: Self-pay | Admitting: Oncology

## 2017-11-13 NOTE — Telephone Encounter (Signed)
Left message for patient regarding appt.

## 2017-12-06 ENCOUNTER — Inpatient Hospital Stay: Payer: 59 | Attending: Oncology | Admitting: Oncology

## 2017-12-06 ENCOUNTER — Telehealth: Payer: Self-pay | Admitting: Oncology

## 2017-12-06 ENCOUNTER — Inpatient Hospital Stay: Payer: 59

## 2017-12-06 VITALS — BP 150/86 | HR 96 | Temp 97.5°F | Resp 20 | Ht 71.0 in | Wt 296.9 lb

## 2017-12-06 DIAGNOSIS — Z87891 Personal history of nicotine dependence: Secondary | ICD-10-CM | POA: Diagnosis not present

## 2017-12-06 DIAGNOSIS — Z79899 Other long term (current) drug therapy: Secondary | ICD-10-CM

## 2017-12-06 DIAGNOSIS — F419 Anxiety disorder, unspecified: Secondary | ICD-10-CM | POA: Diagnosis not present

## 2017-12-06 DIAGNOSIS — Z87442 Personal history of urinary calculi: Secondary | ICD-10-CM | POA: Diagnosis not present

## 2017-12-06 DIAGNOSIS — G4700 Insomnia, unspecified: Secondary | ICD-10-CM | POA: Diagnosis not present

## 2017-12-06 DIAGNOSIS — I10 Essential (primary) hypertension: Secondary | ICD-10-CM | POA: Insufficient documentation

## 2017-12-06 DIAGNOSIS — K219 Gastro-esophageal reflux disease without esophagitis: Secondary | ICD-10-CM | POA: Insufficient documentation

## 2017-12-06 DIAGNOSIS — D72829 Elevated white blood cell count, unspecified: Secondary | ICD-10-CM | POA: Diagnosis not present

## 2017-12-06 DIAGNOSIS — F17210 Nicotine dependence, cigarettes, uncomplicated: Secondary | ICD-10-CM | POA: Insufficient documentation

## 2017-12-06 DIAGNOSIS — Z7951 Long term (current) use of inhaled steroids: Secondary | ICD-10-CM | POA: Insufficient documentation

## 2017-12-06 DIAGNOSIS — E785 Hyperlipidemia, unspecified: Secondary | ICD-10-CM | POA: Insufficient documentation

## 2017-12-06 DIAGNOSIS — J449 Chronic obstructive pulmonary disease, unspecified: Secondary | ICD-10-CM | POA: Insufficient documentation

## 2017-12-06 LAB — CBC WITH DIFFERENTIAL (CANCER CENTER ONLY)
Basophils Absolute: 0.2 10*3/uL — ABNORMAL HIGH (ref 0.0–0.1)
Basophils Relative: 1 %
Eosinophils Absolute: 0.2 10*3/uL (ref 0.0–0.5)
Eosinophils Relative: 1 %
HCT: 42.6 % (ref 38.4–49.9)
Hemoglobin: 13.7 g/dL (ref 13.0–17.1)
Lymphocytes Relative: 59 %
Lymphs Abs: 12.7 10*3/uL — ABNORMAL HIGH (ref 0.9–3.3)
MCH: 28.5 pg (ref 27.2–33.4)
MCHC: 32.2 g/dL (ref 32.0–36.0)
MCV: 88.6 fL (ref 79.3–98.0)
Monocytes Absolute: 2.2 10*3/uL — ABNORMAL HIGH (ref 0.1–0.9)
Monocytes Relative: 10 %
Neutro Abs: 6.2 10*3/uL (ref 1.5–6.5)
Neutrophils Relative %: 29 %
Platelet Count: 225 10*3/uL (ref 140–400)
RBC: 4.81 MIL/uL (ref 4.20–5.82)
RDW: 14.6 % (ref 11.0–15.6)
WBC Count: 21.5 10*3/uL — ABNORMAL HIGH (ref 4.0–10.3)

## 2017-12-06 NOTE — Telephone Encounter (Signed)
Gave avs and calendar for july °

## 2017-12-06 NOTE — Progress Notes (Signed)
Reason for Referral: Leukocytosis.  HPI: 62 year old gentleman currently of  with a history of COPD, anxiety and hypertension.  He was noted to have a leukocytosis on his CBC obtained in November 2018 by Dr. Maudie Mercury.  At that time his white cell count was 24.6, hemoglobin of 13.9 and a platelet count of 227.  His differential showed 87% lymphocytes, monocytosis and 3% metamyelocytes.  Previous CBCs in the past showed fluctuation in his white cell count dating back to 2013.  In January 2018 his white cell count was 28,000 with lymphocytosis with lymphocyte percentage at 69% and absolute lymphocyte count of 19.8.  His white cell count was normal in July 2015 was elevated to 19.5 in June 2015.  He has reported periodic use of prednisone related to sinus infection and also he has been on multiple inhalers because of COPD.  He denies any fevers chills or lymphadenopathy.  He denies any weight loss or decline in his appetite.  He denies any early satiety or other constitutional symptoms.  His mobility is limited because of his COPD and oxygen dependence.  He does ambulate short distances inside his house.  He does not report any headaches, blurry vision, syncope or seizures.  He does not report any night sweats or weight loss.  He does not report any chest pain, palpitation orthopnea.  He does report occasional cough, wheezing and dyspnea on exertion.  He does not report any nausea, vomiting or abdominal pain.  He does not report any frequency urgency or hesitancy.  He does not report any skeletal complaints.  He does not report any frequency urgency or hesitancy.  He does not report any lymphadenopathy.  No skin rashes or lesions.  He does report occasional anxiety.  Remaining review of systems unremarkable.  Past Medical History:  Diagnosis Date  . Anxiety   . COPD (chronic obstructive pulmonary disease) ([place])   . GERD (gastroesophageal reflux disease)   . Hematuria   . Hyperlipidemia   .  Hypertension   . Insomnia   . Nephrolithiasis   . Tobacco dependence   :  Past Surgical History:  Procedure Laterality Date  . LITHOTRIPSY    :   Current Outpatient Medications:  .  albuterol (PROVENTIL) (2.5 MG/3ML) 0.083% nebulizer solution, Take 3 mLs (2.5 mg total) by nebulization as needed for wheezing or shortness of breath. (Patient taking differently: Take 2.5 mg by nebulization every 6 (six) hours as needed for wheezing or shortness of breath. ), Disp: 1080 mL, Rfl: 1 .  albuterol (PROVENTIL) (2.5 MG/3ML) 0.083% nebulizer solution, USE 1 VIAL IN NEBULIZER AS NEEDED FOR WHEEZING/SHORTNESS OF BREATH, Disp: 375 mL, Rfl: 2 .  albuterol (VENTOLIN HFA) 108 (90 Base) MCG/ACT inhaler, Inhale 2 puffs into the lungs every 4 (four) hours as needed for wheezing or shortness of breath., Disp: 1 Inhaler, Rfl: 6 .  ALPRAZolam (XANAX) 0.25 MG tablet, Take 1 tablet (0.25 mg total) by mouth at bedtime as needed for anxiety. (Patient not taking: Reported on 04/30/2017), Disp: 7 tablet, Rfl: 0 .  amoxicillin-clavulanate (AUGMENTIN) 875-125 MG tablet, Take 1 tablet by mouth 2 (two) times daily., Disp: 20 tablet, Rfl: 0 .  bisoprolol (ZEBETA) 5 MG tablet, Take 1 tablet (5 mg total) by mouth daily. (Patient taking differently: Take 5 mg by mouth at bedtime. ), Disp: 30 tablet, Rfl: 3 .  budesonide (PULMICORT) 0.5 MG/2ML nebulizer solution, TAKE 2 MLS (0.5 MG TOTAL) BY NEBULIZATION 2 (TWO) TIMES DAILY., Disp: 120 mL, Rfl: 5 .  cephALEXin (KEFLEX) 500 MG capsule, Take 1 capsule (500 mg total) by mouth 3 (three) times daily. (Patient not taking: Reported on 12/17/2016), Disp: 15 capsule, Rfl: 0 .  Cinnamon 500 MG capsule, Take 500 mg by mouth daily., Disp: , Rfl:  .  cloNIDine (CATAPRES) 0.2 MG tablet, Take 0.2 mg by mouth 3 (three) times daily., Disp: , Rfl:  .  doxycycline (VIBRA-TABS) 100 MG tablet, Take 1 tablet, twice daily for 5 days. Avoid sunlight and take after meals., Disp: 10 tablet, Rfl: 0 .   fluticasone (FLONASE) 50 MCG/ACT nasal spray, Place 2 sprays into both nostrils daily., Disp: , Rfl:  .  ipratropium (ATROVENT) 0.02 % nebulizer solution, USE 1 VIAL IN NEBULIZER FOUR TIMES A DAY, Disp: 360 mL, Rfl: 6 .  ipratropium (ATROVENT) 0.02 % nebulizer solution, INHALE 1 VIAL IN NEBULIZER 4 TIMES A DAY, Disp: 360 mL, Rfl: 6 .  ipratropium-albuterol (DUONEB) 0.5-2.5 (3) MG/3ML SOLN, USE 1 VIAL IN NEBULIZER 4 TIMES A DAY, Disp: 360 mL, Rfl: 2 .  levofloxacin (LEVAQUIN) 750 MG tablet, Take 1 tablet (750 mg total) by mouth daily. X 7 days, Disp: 7 tablet, Rfl: 0 .  loratadine (CLARITIN) 10 MG tablet, Take 1 tablet (10 mg total) by mouth daily., Disp: 30 tablet, Rfl: 0 .  losartan-hydrochlorothiazide (HYZAAR) 50-12.5 MG per tablet, Take 1 tablet by mouth daily., Disp: , Rfl:  .  montelukast (SINGULAIR) 10 MG tablet, Take 10 mg by mouth at bedtime., Disp: , Rfl:  .  nystatin (MYCOSTATIN) 100000 UNIT/ML suspension, Take 5 mLs (500,000 Units total) by mouth 4 (four) times daily. For 5 days, Disp: 100 mL, Rfl: 0 .  omeprazole (PRILOSEC) 20 MG capsule, Take 2 capsules (40 mg total) by mouth daily. For GERD., Disp: 30 capsule, Rfl: 6 .  ondansetron (ZOFRAN) 4 MG tablet, Take 1 tablet (4 mg total) by mouth every 6 (six) hours., Disp: 12 tablet, Rfl: 0 .  predniSONE (DELTASONE) 10 MG tablet, 40 mg x1 day, then 30 mg x1 day, then 20 mg x1 day, then 10 mg x1 day, and then 5 mg x1 day and stop, Disp: 11 tablet, Rfl: 0 .  predniSONE (DELTASONE) 20 MG tablet, 3 tabs po day one, then 2 po daily x 4 days, Disp: 11 tablet, Rfl: 0 .  sodium chloride (OCEAN) 0.65 % SOLN nasal spray, Place 2 sprays into both nostrils as needed for congestion. (Patient taking differently: Place 2 sprays into both nostrils daily as needed for congestion. ), Disp: , Rfl: 0:  Allergies  Allergen Reactions  . Codeine Itching    "jittery"  . Prozac [Fluoxetine Hcl]     confusion  :  Family History  Problem Relation Age of Onset   . Heart disease Father   . Stroke Mother   . Leukemia Brother   :  Social History   Socioeconomic History  . Marital status: Married    Spouse name: Not on file  . Number of children: Not on file  . Years of education: Not on file  . Highest education level: Not on file  Social Needs  . Financial resource strain: Not on file  . Food insecurity - worry: Not on file  . Food insecurity - inability: Not on file  . Transportation needs - medical: Not on file  . Transportation needs - non-medical: Not on file  Occupational History  . Occupation: unemployed  Tobacco Use  . Smoking status: Former Smoker    Packs/day: 1.00  Years: 35.00    Pack years: 35.00    Types: Cigarettes    Last attempt to quit: 11/26/2010    Years since quitting: 7.0  . Smokeless tobacco: Never Used  Substance and Sexual Activity  . Alcohol use: Not on file  . Drug use: Not on file  . Sexual activity: Yes  Other Topics Concern  . Not on file  Social History Narrative  . Not on file  :  Pertinent items are noted in HPI.  Exam: Blood pressure (!) 150/86, pulse 96, temperature (!) 97.5 F (36.4 C), temperature source Oral, resp. rate 20, height 5\' 11"  (1.803 m), weight 296 lb 14.4 oz (134.7 kg), SpO2 97 %.  ECOG 2 General appearance: alert and cooperative appeared without distress. Eyes: conjunctivae/corneas clear. PERRL.  Throat: No oral thrush or ulcers. Resp: clear to auscultation bilaterally expiratory wheezes noted. Cardio: regular rate and rhythm, S1, S2 normal, no murmur, click, rub or gallop GI: soft, non-tender; bowel sounds normal; no masses,  no organomegaly Skeletal exam: Extremities normal, atraumatic, no cyanosis or edema Skin: Skin color, texture, turgor normal. No rashes or lesions Lymph nodes: Cervical, supraclavicular, and axillary nodes normal. Neurologic: Grossly normal  CBC    Component Value Date/Time   WBC 28.7 (H) 12/17/2016 0839   RBC 4.77 12/17/2016 0839   HGB 13.5  12/17/2016 0839   HCT 42.8 12/17/2016 0839   PLT 261 12/17/2016 0839   MCV 89.7 12/17/2016 0839   MCH 28.3 12/17/2016 0839   MCHC 31.5 12/17/2016 0839   RDW 15.2 12/17/2016 0839   LYMPHSABS 19.8 (H) 12/17/2016 0839   MONOABS 1.7 (H) 12/17/2016 0839   EOSABS 0.0 12/17/2016 0839   BASOSABS 0.0 12/17/2016 0839     Chemistry      Component Value Date/Time   NA 139 12/17/2016 0839   K 4.1 12/17/2016 0839   CL 101 12/17/2016 0839   CO2 30 12/17/2016 0839   BUN 7 12/17/2016 0839   CREATININE 0.96 12/17/2016 0839      Component Value Date/Time   CALCIUM 8.5 (L) 12/17/2016 0839   ALKPHOS 92 12/17/2016 0839   AST 88 (H) 12/17/2016 0839   ALT 76 (H) 12/17/2016 0839   BILITOT 0.4 12/17/2016 0839       Assessment and Plan:   62 year old gentleman with the following issues:  1.  Leukocytosis with occasional lymphocytosis.  His white cell count has fluctuated since 2015 within normal range and as high as the 28000 in January 2018.  His white cell count in November 2018 was 24,000 with normal lymphocytes but increased monocytosis.  The differential diagnosis was discussed today with the patient and his family.  Reactive bursitis is a possibility given his history of COPD, steroid use as well as multiple steroid inhalers.  Chronic leukemia could also be a consideration in the form of chronic lymphocytic leukemia and less likely chronic myelogenous leukemia.  For evaluation, I will obtain peripheral blood flow cytometry to evaluate for CLL as the most common condition in this setting.  Other considerations such as mantle cell lymphoma and other lymphoproliferative disorder are considered less likely.  The natural course of CLL was discussed today with the patient.  Given the chronicity of these findings and the fact that he is asymptomatic, I see no need for immediate intervention if this diagnosis is documented.  I would recommend observation and surveillance in this particular setting.  I  will communicate the results of his laboratory testing from today as  well as further recommendation for follow-up.  2.  Follow-up: We will be in 6 months sooner if needed.   45 minutes was spent today face-to-face with the patient. More than 50% of the time was spent on counseling and coordination of care.

## 2017-12-09 DIAGNOSIS — J9620 Acute and chronic respiratory failure, unspecified whether with hypoxia or hypercapnia: Secondary | ICD-10-CM | POA: Diagnosis not present

## 2017-12-09 DIAGNOSIS — J449 Chronic obstructive pulmonary disease, unspecified: Secondary | ICD-10-CM | POA: Diagnosis not present

## 2017-12-10 ENCOUNTER — Other Ambulatory Visit: Payer: Self-pay | Admitting: Internal Medicine

## 2017-12-24 ENCOUNTER — Encounter: Payer: Self-pay | Admitting: *Deleted

## 2018-01-07 ENCOUNTER — Encounter: Payer: Self-pay | Admitting: Internal Medicine

## 2018-01-07 ENCOUNTER — Ambulatory Visit (INDEPENDENT_AMBULATORY_CARE_PROVIDER_SITE_OTHER): Payer: 59 | Admitting: Internal Medicine

## 2018-01-07 VITALS — BP 108/64 | HR 88 | Ht 71.0 in | Wt 295.6 lb

## 2018-01-07 DIAGNOSIS — J449 Chronic obstructive pulmonary disease, unspecified: Secondary | ICD-10-CM | POA: Diagnosis not present

## 2018-01-07 DIAGNOSIS — B370 Candidal stomatitis: Secondary | ICD-10-CM | POA: Diagnosis not present

## 2018-01-07 DIAGNOSIS — J9612 Chronic respiratory failure with hypercapnia: Secondary | ICD-10-CM | POA: Diagnosis not present

## 2018-01-07 DIAGNOSIS — Z23 Encounter for immunization: Secondary | ICD-10-CM | POA: Diagnosis not present

## 2018-01-07 MED ORDER — BUDESONIDE 0.5 MG/2ML IN SUSP: 0.5000 mg | Freq: Two times a day (BID) | RESPIRATORY_TRACT | 5 refills | Status: DC

## 2018-01-07 MED ORDER — NYSTATIN 100000 UNIT/ML MT SUSP: 5.0000 mL | Freq: Four times a day (QID) | OROMUCOSAL | 0 refills | Status: DC

## 2018-01-07 MED ORDER — IPRATROPIUM BROMIDE 0.02 % IN SOLN: RESPIRATORY_TRACT | 6 refills | Status: DC

## 2018-01-07 MED ORDER — ALBUTEROL SULFATE (2.5 MG/3ML) 0.083% IN NEBU: 2.5000 mg | INHALATION_SOLUTION | RESPIRATORY_TRACT | 1 refills | Status: DC | PRN

## 2018-01-07 NOTE — Progress Notes (Signed)
Subjective:     Patient ID: Isaac [name], male   DOB: [birth year], 62 y.o.   MRN: [id number]  HPI   #Obesity Body mass index is 39.91 kg/(m^2). on 03/26/2013  #Chronic sinusitis T sinus 2006   - IMPRESSION:  1. Ethmoid and frontal sinus dise Case. Sphenoid and maxillary sinuses are clear.  2. Patent ostiomeatal complexes.  #Smoking history  reports that he quit smoking about 2 years ago. His smoking use included Cigarettes. He has a 35 pack-year smoking history. He does not have any smokeless tobacco history on file.  #Multifactorial dyspnea -  Nuclear med stress test  2013 -  - negative per hx. Done by Dr Einar Gip per hx  #COPD - Severe/very severe COPD Spirometry 12/31/11 done at [facility]- fev1 0.8L/21%, Rati 39, 23% BD response on fev1 May 2013: Walk test in office : Pulse 88% at rest -> 1 lap x 185 feet - pulse ox 88% and HR 110 and very dyspneic, so stopped - Status post pulmonary rehabilitation fall 2013   #Evaluation for lung cancer : CXR 12/24/11   Clear lung fields - personally revieweed  #Recurrent COPD - May 2013: Outpatient treatment with doxycycline and prednisone - August 2013: Hospitalization for COPD exacerbation - January 2014: Outpatient treatment with antibiotics and prednisone - March 2015 : telephoine Rx with sinusitis, abx and prednisone - April 2015: telephoine Rx with abx and prednisone - June 2014 - VDRF with admission to ICU.        OV 06/08/2014  Chief Complaint  Patient presents with  . Follow-up    Pt states since using his neb meds his breathing has improved.C/o DOE. Denies CP/tightness and cough.    Followup from recent ICU hospitalization for respiratory failure. At this point in time he is significantly improved. He used to be bothered by significant sinus blockage for which he had an extensive workup including MRI and MRA but apparently after he started using nebulizers instead of inhalers this symptom has resolved. He still physically  deconditioned. He did have home physical therapy but he found him down an operative go self-directed exercises but after my counseling today he is open to reinitiating home physical therapy. This is because he still physically deconditioned. He uses oxygen and nebulizers on a compliant fashion. PFTs today show significant severe Gold stage IV lung disease with FEV1 of 0.92/24% and a ratio of 42. Total lung capacity of 122% and a DLCO that significantly impaired at 8.8/26%.  Labs reviewed June 2040 in the hospital at discharge he was anemic with a hemoglobin of 10 g percent. This has not been followed up. His chemistries were normal.  Past medical history: He did have a sleep study over the weekend. I do not know the results. This was ordered by Dr. Halford Chessman and I will set up followup for this to Dr. Jaclyn Prime 08/22/2015  Chief Complaint  Patient presents with  . Follow-up    Pt states his breathing was worse over the summer because of the heat and humidity. Pt c/o night time prod cough with white mucus, right ear pressure, intermittent sinus pressure. Pt denies CP/tightness.    Follow-up chronic respiratory failure with hypoxemia and hypercapnia associated with Gold stage IV COPD and morbid obesity.  In early 2015 he had life-threatening hospitalization from respiratory failure. Last visit in the office was July 2015. After that he failed to follow-up. He now presents with his wife. They want refills of his nebulizers and wants  to up-to-date himself with the vaccines. The last 1 year they deny any hospitalizations or emergency room visits or urgent care visits or new medical diagnoses. He is mostly sedentary and is house sitting but he does do limited activities of daily living such as changing clothes and self-feed. He did travel to the beach but he stayed in the hotel in the beach. Currently he does have some mild baseline cough with yellow sputum but this is unchanged compared to baseline. There are no  new issues.   OV 09/05/2016  Chief Complaint  Patient presents with  . Follow-up    Pt breathing is feels slightly worst due to weather. Breathing has changed since last seen. Uses nebulizer after excertion. No tightness, or congestion today. Coughing is sometimes dry and productive. coughing up clear mucus. Discuss flu shot, handy cap plaquer, and rescue inhaler.     Follow-up chronic approximately possibly hypercapnic respiratory failure from Gold stage IV COPD and morbid obesity and sleep apnea ruled out per history  Last seen one year ago. He was supposed to come back in 6 months but did not. Review of the chart suggests that he did not have any hospitalizations or emergency room visits. He and his wife confirmed that. He is extremely sedentary. He spends most of his day in bed playing video games and surfing the Internet. He has the ability to walk from room to room but is limited because of shortness of breath. He requests some help with activities of daily living such as changing clothes or taking a bath. Overall stable. He is asking for an increase in his nebulizer frequency. His upcoming oral surgery evaluation and might require anesthesia for it. Is requesting flu shot.   OV 04/30/2017  Chief Complaint  Patient presents with  . Follow-up    FOLLOW UP FOR Chronic respiratory failure with hyercapnia, COPD , severe     Follow-up chronic approximately possibly hypercapnic respiratory failure from Gold stage IV COPD and morbid obesity and sleep apnea ruled out per history   Routine follow-up for this patient with the above medical problems. Overall is doing well. His wife is here with him. There are no new exacerbations or admissions. His son got married in the interim. He did fine with his oral surgery and is now feeling better. He complains of occasional mucus being stuck in his chest but chest x-ray earlier this year was clear. Uses pure mist and able to clear the mucus    OV  01/07/2018  Chief Complaint  Patient presents with  . Follow-up    Pt has had elevated WBCs, SOB is worse today than it has been, coughing at night. Denies any CP. DME: [facility], 2L at rest and 3L with movement.     End-stage COPD with hypoxemic respiratory failure on oxygen.  38-month follow-up but I last saw him in June 2018.  He presents with his wife.  I am meeting his daughter Raquel Sarna for the first time.  Overall they report that he is stable on his nebulizers and oxygen.  Apparently he has had a interim diagnosis of CLL.  Flu shot was held but after seeing hematologist he has been cleared to have a flu shot.  He wants to have a flu shot today.  With the cold weather in the last 2 days in the recent visit to the office today he is a little bit more short of breath than usual but does not think he is in an exacerbation.  COPD CAT score is 27  CAT COPD Symptom & Quality of Life Score (GSK trademark) 0 is no burden. 5 is highest burden 01/07/2018   Never Cough -> Cough all the time 3  No phlegm in chest -> Chest is full of phlegm 2  No chest tightness -> Chest feels very tight 1  No dyspnea for 1 flight stairs/hill -> Very dyspneic for 1 flight of stairs 5  No limitations for ADL at home -> Very limited with ADL at home 4  Confident leaving home -> Not at all confident leaving home 5  Sleep soundly -> Do not sleep soundly because of lung condition 3  Lots of Energy -> No energy at all 4  TOTAL Score (max 40)  27        has a past medical history of Anxiety, COPD (chronic obstructive pulmonary disease) ([place]), GERD (gastroesophageal reflux disease), Hematuria, Hyperlipidemia, Hypertension, Insomnia, Nephrolithiasis, and Tobacco dependence.   reports that he quit smoking about 7 years ago. His smoking use included cigarettes. He has a 35.00 pack-year smoking history. he has never used smokeless tobacco.  Past Surgical History:  Procedure Laterality Date  . LITHOTRIPSY      Allergies   Allergen Reactions  . Codeine Itching    "jittery"  . Prozac [Fluoxetine Hcl]     confusion    Immunization History  Administered Date(s) Administered  . Influenza Split 09/27/2011, 08/20/2012, 07/27/2013  . Influenza,inj,Quad PF,6+ Mos 08/22/2015, 10/26/2016  . Pneumococcal Conjugate-13 08/22/2015  . Pneumococcal Polysaccharide-23 07/23/2012    Family History  Problem Relation Age of Onset  . Heart disease Father   . Stroke Mother   . Leukemia Brother      Current Outpatient Medications:  .  albuterol (PROVENTIL) (2.5 MG/3ML) 0.083% nebulizer solution, Take 3 mLs (2.5 mg total) by nebulization as needed for wheezing or shortness of breath. (Patient taking differently: Take 2.5 mg by nebulization every 6 (six) hours as needed for wheezing or shortness of breath. ), Disp: 1080 mL, Rfl: 1 .  albuterol (VENTOLIN HFA) 108 (90 Base) MCG/ACT inhaler, Inhale 2 puffs into the lungs every 4 (four) hours as needed for wheezing or shortness of breath., Disp: 1 Inhaler, Rfl: 6 .  bisoprolol (ZEBETA) 5 MG tablet, Take 1 tablet (5 mg total) by mouth daily. (Patient taking differently: Take 5 mg by mouth at bedtime. ), Disp: 30 tablet, Rfl: 3 .  budesonide (PULMICORT) 0.5 MG/2ML nebulizer solution, TAKE 2 MLS (0.5 MG TOTAL) BY NEBULIZATION 2 (TWO) TIMES DAILY., Disp: 120 mL, Rfl: 5 .  Cinnamon 500 MG capsule, Take 500 mg by mouth daily., Disp: , Rfl:  .  cloNIDine (CATAPRES) 0.2 MG tablet, Take 0.2 mg by mouth 3 (three) times daily., Disp: , Rfl:  .  ipratropium (ATROVENT) 0.02 % nebulizer solution, USE 1 VIAL IN NEBULIZER FOUR TIMES A DAY, Disp: 360 mL, Rfl: 6 .  ipratropium-albuterol (DUONEB) 0.5-2.5 (3) MG/3ML SOLN, USE 1 VIAL IN NEBULIZER 4 TIMES A DAY, Disp: 360 mL, Rfl: 2 .  losartan-hydrochlorothiazide (HYZAAR) 50-12.5 MG per tablet, Take 1 tablet by mouth daily., Disp: , Rfl:  .  montelukast (SINGULAIR) 10 MG tablet, Take 10 mg by mouth at bedtime., Disp: , Rfl:  .  omeprazole (PRILOSEC)  20 MG capsule, Take 2 capsules (40 mg total) by mouth daily. For GERD., Disp: 30 capsule, Rfl: 6 .  sodium chloride (OCEAN) 0.65 % SOLN nasal spray, Place 2 sprays into both nostrils as needed for congestion. (Patient  taking differently: Place 2 sprays into both nostrils daily as needed for congestion. ), Disp: , Rfl: 0 .  ALPRAZolam (XANAX) 0.25 MG tablet, Take 1 tablet (0.25 mg total) by mouth at bedtime as needed for anxiety. (Patient not taking: Reported on 01/07/2018), Disp: 7 tablet, Rfl: 0   Review of Systems     Objective:   Physical Exam  Vitals:   01/07/18 1217  BP: 108/64  Pulse: 88  SpO2: 93%  Weight: 295 lb 9.6 oz (134.1 kg)  Height: 5\' 11"  (1.803 m)    Estimated body mass index is 41.23 kg/m as calculated from the following:   Height as of this encounter: 5\' 11"  (1.803 m).   Weight as of this encounter: 295 lb 9.6 oz (134.1 kg).   General Appearance:   stable looking, obese sitting on wheel chair  Head:    Normocephalic, without obvious abnormality, atraumatic  Eyes:    PERRL - yes, conjunctiva/corneas - clear      Ears:    Normal external ear canals, both ears  Nose:   NG tube - no but has  o2  Throat:  ETT TUBE - no , OG tube - no  Neck:   Supple,  No enlargement/tenderness/nodules     Lungs:     Clear to auscultation bilaterally, no distress. Overall diminished  Chest wall:    No deformity  Heart:    S1 and S2 normal, no murmur, CVP - no.  Pressors - no  Abdomen:     Soft, no masses, no organomegaly  Genitalia:    Not done  Rectal:   not done  Extremities:   Extremities- mild chronic edema     Skin:   Intact in exposed areas .      Neurologic:    Moves all 4s - yes. CAM-ICU - neg . Orientation - x3+         Assessment:       ICD-10-CM   1. Chronic respiratory failure with hypercapnia (HCC) J96.12   2. COPD, very severe ([place]) J44.9   3. Oral thrush B37.0        Plan:      STable copd but you have recurrent thrush  Plan cotninue o2 and  nebs - flu shot 01/07/2018 - For Oral thrush: Take Suspension (swish and swallow): 500,000 units 4 times/day for 5 days; swish in the mouth and retain for as long as possible (several minutes) before swallowing - if this does not resolve thrush - call and we can call in tablet diflucan  Followup 6 months or sooner if neeed; CAT score at followup Call sooner if you feel you are in flare up or other pressing issues     Dr. Brand Males, M.D., [facility].C.P Pulmonary and Critical Care Medicine Staff Physician, [place] Director - Interstitial Lung Disease  Program  [facility] [place] [facility] [place], [place], [address]  Pager: [phone], If no answer or between  15:00h - 7:00h: call [phone] Telephone: [phone]

## 2018-01-07 NOTE — Patient Instructions (Signed)
ICD-10-CM   1. Chronic respiratory failure with hypercapnia (HCC) J96.12   2. COPD, very severe ([place]) J44.9   3. Oral thrush B37.0     STable copd but you have recurrent thrush  Plan cotninue o2 and nebs - flu shot 01/07/2018 - For Oral thrush: Take Suspension (swish and swallow): 500,000 units 4 times/day for 5 days; swish in the mouth and retain for as long as possible (several minutes) before swallowing - if this does not resolve thrush - call and we can call in tablet diflucan  Followup 6 months or sooner if neeed; CAT score at followup Call sooner if you feel you are in flare up or other pressing issues

## 2018-01-09 DIAGNOSIS — J9620 Acute and chronic respiratory failure, unspecified whether with hypoxia or hypercapnia: Secondary | ICD-10-CM | POA: Diagnosis not present

## 2018-01-09 DIAGNOSIS — J449 Chronic obstructive pulmonary disease, unspecified: Secondary | ICD-10-CM | POA: Diagnosis not present

## 2018-01-15 ENCOUNTER — Ambulatory Visit: Payer: 59 | Admitting: Internal Medicine

## 2018-02-03 ENCOUNTER — Other Ambulatory Visit: Payer: Self-pay | Admitting: Internal Medicine

## 2018-02-06 DIAGNOSIS — J449 Chronic obstructive pulmonary disease, unspecified: Secondary | ICD-10-CM | POA: Diagnosis not present

## 2018-02-06 DIAGNOSIS — J9620 Acute and chronic respiratory failure, unspecified whether with hypoxia or hypercapnia: Secondary | ICD-10-CM | POA: Diagnosis not present

## 2018-02-18 ENCOUNTER — Other Ambulatory Visit: Payer: Self-pay | Admitting: Internal Medicine

## 2018-03-09 DIAGNOSIS — J9620 Acute and chronic respiratory failure, unspecified whether with hypoxia or hypercapnia: Secondary | ICD-10-CM | POA: Diagnosis not present

## 2018-03-09 DIAGNOSIS — J449 Chronic obstructive pulmonary disease, unspecified: Secondary | ICD-10-CM | POA: Diagnosis not present

## 2018-03-11 ENCOUNTER — Other Ambulatory Visit: Payer: Self-pay | Admitting: Internal Medicine

## 2018-03-24 ENCOUNTER — Other Ambulatory Visit: Payer: Self-pay | Admitting: Internal Medicine

## 2018-04-08 DIAGNOSIS — J9620 Acute and chronic respiratory failure, unspecified whether with hypoxia or hypercapnia: Secondary | ICD-10-CM | POA: Diagnosis not present

## 2018-04-08 DIAGNOSIS — J449 Chronic obstructive pulmonary disease, unspecified: Secondary | ICD-10-CM | POA: Diagnosis not present

## 2018-04-14 ENCOUNTER — Other Ambulatory Visit: Payer: Self-pay | Admitting: Internal Medicine

## 2018-05-09 DIAGNOSIS — J9620 Acute and chronic respiratory failure, unspecified whether with hypoxia or hypercapnia: Secondary | ICD-10-CM | POA: Diagnosis not present

## 2018-05-09 DIAGNOSIS — J449 Chronic obstructive pulmonary disease, unspecified: Secondary | ICD-10-CM | POA: Diagnosis not present

## 2018-05-15 ENCOUNTER — Other Ambulatory Visit: Payer: Self-pay | Admitting: Internal Medicine

## 2018-06-05 ENCOUNTER — Telehealth: Payer: Self-pay | Admitting: Oncology

## 2018-06-05 ENCOUNTER — Inpatient Hospital Stay: Payer: 59

## 2018-06-05 ENCOUNTER — Inpatient Hospital Stay: Payer: 59 | Attending: Oncology | Admitting: Oncology

## 2018-06-05 VITALS — BP 109/80 | HR 116 | Temp 98.6°F | Resp 20 | Ht 71.0 in | Wt 290.9 lb

## 2018-06-05 DIAGNOSIS — D72829 Elevated white blood cell count, unspecified: Secondary | ICD-10-CM

## 2018-06-05 DIAGNOSIS — Z9981 Dependence on supplemental oxygen: Secondary | ICD-10-CM | POA: Diagnosis not present

## 2018-06-05 DIAGNOSIS — C9110 Chronic lymphocytic leukemia of B-cell type not having achieved remission: Secondary | ICD-10-CM

## 2018-06-05 DIAGNOSIS — Z79899 Other long term (current) drug therapy: Secondary | ICD-10-CM | POA: Insufficient documentation

## 2018-06-05 DIAGNOSIS — C9190 Lymphoid leukemia, unspecified not having achieved remission: Secondary | ICD-10-CM | POA: Diagnosis not present

## 2018-06-05 LAB — CBC WITH DIFFERENTIAL (CANCER CENTER ONLY)
Basophils Absolute: 0.1 10*3/uL (ref 0.0–0.1)
Basophils Relative: 0 %
Eosinophils Absolute: 0.2 10*3/uL (ref 0.0–0.5)
Eosinophils Relative: 1 %
HCT: 44.5 % (ref 38.4–49.9)
Hemoglobin: 14.8 g/dL (ref 13.0–17.1)
Lymphocytes Relative: 62 %
Lymphs Abs: 16.9 10*3/uL — ABNORMAL HIGH (ref 0.9–3.3)
MCH: 29 pg (ref 27.2–33.4)
MCHC: 33.3 g/dL (ref 32.0–36.0)
MCV: 87.3 fL (ref 79.3–98.0)
Monocytes Absolute: 2 10*3/uL — ABNORMAL HIGH (ref 0.1–0.9)
Monocytes Relative: 8 %
Neutro Abs: 7.7 10*3/uL — ABNORMAL HIGH (ref 1.5–6.5)
Neutrophils Relative %: 29 %
Platelet Count: 220 10*3/uL (ref 140–400)
RBC: 5.1 MIL/uL (ref 4.20–5.82)
RDW: 13.7 % (ref 11.0–14.6)
WBC Count: 26.8 10*3/uL — ABNORMAL HIGH (ref 4.0–10.3)

## 2018-06-05 NOTE — Progress Notes (Signed)
Hematology and Oncology Follow Up Visit  Isaac [name] [id number] 08/30/1956 62 y.o. 06/05/2018 2:52 PM Isaac [name], MDKim, Jeneen Rinks, MD   Principle Diagnosis: 62 year old man with CLL diagnosed in January 2019.  He presented with lymphocytosis and white cell count of 21,000.  His diagnosis confirmed by peripheral blood flow cytometry.   Current therapy: Active surveillance.  Interim History: [name] presents today for a follow-up visit.  Since the last visit, since her last visit, he reports no major changes in his health.  He continues to be limited in his mobility because of his respiratory status continues to be oxygen dependent.  He does ambulate short distances inside of his house but uses a wheelchair for long distances.  He denies any painful adenopathy, constitutional symptoms or weight loss.  His appetite remain excellent and his weight is unchanged.  He does not report any headaches, blurry vision, syncope or seizures. Does not report any fevers, chills or sweats.  Does not report any  wheezing or hemoptysis.  Does not report any chest pain, palpitation, orthopnea or leg edema.  Does not report any nausea, vomiting or abdominal pain.  Does not report any constipation or diarrhea.  Does not report any skeletal complaints.    Does not report frequency, urgency or hematuria.  Does not report any skin rashes or lesions. Does not report any heat or cold intolerance.  Does not report any lymphadenopathy or petechiae.  Does not report any anxiety or depression.  Remaining review of systems is negative.    Medications: I have reviewed the patient's current medications.  Current Outpatient Medications  Medication Sig Dispense Refill  . albuterol (PROVENTIL) (2.5 MG/3ML) 0.083% nebulizer solution Take 3 mLs (2.5 mg total) by nebulization as needed for wheezing or shortness of breath. 1080 mL 1  . albuterol (PROVENTIL) (2.5 MG/3ML) 0.083% nebulizer solution USE 1 VIAL IN NEBULIZER AS NEEDED FOR  WHEEZING/SHORTNESS OF BREATH 375 mL 2  . albuterol (VENTOLIN HFA) 108 (90 Base) MCG/ACT inhaler Inhale 2 puffs into the lungs every 4 (four) hours as needed for wheezing or shortness of breath. 1 Inhaler 6  . ALPRAZolam (XANAX) 0.25 MG tablet Take 1 tablet (0.25 mg total) by mouth at bedtime as needed for anxiety. (Patient not taking: Reported on 01/07/2018) 7 tablet 0  . bisoprolol (ZEBETA) 5 MG tablet Take 1 tablet (5 mg total) by mouth daily. (Patient taking differently: Take 5 mg by mouth at bedtime. ) 30 tablet 3  . budesonide (PULMICORT) 0.5 MG/2ML nebulizer solution Take 2 mLs (0.5 mg total) by nebulization 2 (two) times daily. 120 mL 5  . budesonide (PULMICORT) 0.5 MG/2ML nebulizer solution TAKE 2 ML BY NEBULIZATION 2 TIMES DAILY. 360 mL 5  . Cinnamon 500 MG capsule Take 500 mg by mouth daily.    . cloNIDine (CATAPRES) 0.2 MG tablet Take 0.2 mg by mouth 3 (three) times daily.    Marland Kitchen ipratropium (ATROVENT) 0.02 % nebulizer solution USE 1 VIAL IN NEBULIZER FOUR TIMES A DAY 360 mL 6  . ipratropium (ATROVENT) 0.02 % nebulizer solution INHALE 1 VIAL IN NEBULIZER 4 TIMES A DAY 360 mL 6  . ipratropium-albuterol (DUONEB) 0.5-2.5 (3) MG/3ML SOLN USE 1 VIAL IN NEBULIZER 4 TIMES A DAY 360 mL 2  . losartan-hydrochlorothiazide (HYZAAR) 50-12.5 MG per tablet Take 1 tablet by mouth daily.    . montelukast (SINGULAIR) 10 MG tablet Take 10 mg by mouth at bedtime.    Marland Kitchen nystatin (MYCOSTATIN) 100000 UNIT/ML suspension Take 5 mLs (  500,000 Units total) by mouth 4 (four) times daily. 120 mL 0  . omeprazole (PRILOSEC) 20 MG capsule Take 2 capsules (40 mg total) by mouth daily. For GERD. 30 capsule 6  . sodium chloride (OCEAN) 0.65 % SOLN nasal spray Place 2 sprays into both nostrils as needed for congestion. (Patient taking differently: Place 2 sprays into both nostrils daily as needed for congestion. )  0   No current facility-administered medications for this visit.      Allergies:  Allergies  Allergen  Reactions  . Codeine Itching    "jittery"  . Prozac [Fluoxetine Hcl]     confusion    Past Medical History, Surgical history, Social history, and Family History were reviewed and updated.    Physical Exam: Blood pressure 109/80, pulse (!) 116, temperature 98.6 F (37 C), temperature source Oral, resp. rate 20, height 5\' 11"  (1.803 m), weight 290 lb 14.4 oz (132 kg), SpO2 93 %.   ECOG: 2 General appearance: Chronically ill-appearing gentleman without distress. Head: Normocephalic, without obvious abnormality Oropharynx: Mucous membranes are moist and pink.   Eyes: No scleral icterus.  Pupils are equal and round reactive to light. Lymph nodes: No lymphadenopathy noted in the cervical, supraclavicular, or axillary lymph nodes. Heart:regular rate and rhythm, no murmurs.  Leg edema noted. Lung: Scattered wheezes and rhonchi noted. Abdomin: soft, non-tender, good bowel sounds without any shifting dullness or ascites. Neurological: No motor, sensory deficits.  Intact deep tendon reflexes. Skin: No rashes or lesions.  No ecchymosis or petechiae. Musculoskeletal: No joint deformity or effusion.     Lab Results: Lab Results  Component Value Date   WBC 21.5 (H) 12/06/2017   HGB 13.7 12/06/2017   HCT 42.6 12/06/2017   MCV 88.6 12/06/2017   PLT 225 12/06/2017     Chemistry      Component Value Date/Time   NA 139 12/17/2016 0839   K 4.1 12/17/2016 0839   CL 101 12/17/2016 0839   CO2 30 12/17/2016 0839   BUN 7 12/17/2016 0839   CREATININE 0.96 12/17/2016 0839      Component Value Date/Time   CALCIUM 8.5 (L) 12/17/2016 0839   ALKPHOS 92 12/17/2016 0839   AST 88 (H) 12/17/2016 0839   ALT 76 (H) 12/17/2016 0839   BILITOT 0.4 12/17/2016 0839        Impression and Plan:  62 year old man:  1.    CLL diagnosed in January 2019: He presented with leukocytosis and lymphocytosis.    He has been on active surveillance since the time of diagnosis.  Continues to be without any  symptoms or indication for treatment.  The natural course of this disease was reviewed today with patient and his family and indication of treatment were discussed.  These were include symptomatic lymphadenopathy, rapid rise in his white cell count, immune cytopenias among others.  After discussion today, the plan is to continue with active surveillance and repeat laboratory testing in 6 months.   2.  Follow-up: We will be in 6 months sooner if needed.   15  minutes was spent with the patient face-to-face today.  More than 50% of time was dedicated to patient counseling, education and discussing the natural course of this disease as well as future treatment options.    Zola Button, MD 7/11/20192:52 PM

## 2018-06-05 NOTE — Telephone Encounter (Signed)
Appointments scheduled AVS/Calendar printed per 7/11 los °

## 2018-06-08 DIAGNOSIS — J9620 Acute and chronic respiratory failure, unspecified whether with hypoxia or hypercapnia: Secondary | ICD-10-CM | POA: Diagnosis not present

## 2018-06-08 DIAGNOSIS — J449 Chronic obstructive pulmonary disease, unspecified: Secondary | ICD-10-CM | POA: Diagnosis not present

## 2018-07-03 DIAGNOSIS — H9202 Otalgia, left ear: Secondary | ICD-10-CM | POA: Diagnosis not present

## 2018-07-03 DIAGNOSIS — H66005 Acute suppurative otitis media without spontaneous rupture of ear drum, recurrent, left ear: Secondary | ICD-10-CM | POA: Diagnosis not present

## 2018-07-09 DIAGNOSIS — J9620 Acute and chronic respiratory failure, unspecified whether with hypoxia or hypercapnia: Secondary | ICD-10-CM | POA: Diagnosis not present

## 2018-07-09 DIAGNOSIS — J449 Chronic obstructive pulmonary disease, unspecified: Secondary | ICD-10-CM | POA: Diagnosis not present

## 2018-08-09 ENCOUNTER — Other Ambulatory Visit: Payer: Self-pay | Admitting: Internal Medicine

## 2018-08-09 DIAGNOSIS — J449 Chronic obstructive pulmonary disease, unspecified: Secondary | ICD-10-CM | POA: Diagnosis not present

## 2018-08-09 DIAGNOSIS — J9620 Acute and chronic respiratory failure, unspecified whether with hypoxia or hypercapnia: Secondary | ICD-10-CM | POA: Diagnosis not present

## 2018-08-11 NOTE — Telephone Encounter (Signed)
MR- this pt has both duoneb and plain albuterol nebs on his med list  Which one do you want [place] to refill? Please advise thanks

## 2018-08-13 ENCOUNTER — Other Ambulatory Visit: Payer: Self-pay | Admitting: Internal Medicine

## 2018-09-05 ENCOUNTER — Ambulatory Visit (INDEPENDENT_AMBULATORY_CARE_PROVIDER_SITE_OTHER): Payer: 59 | Admitting: Internal Medicine

## 2018-09-05 ENCOUNTER — Encounter: Payer: Self-pay | Admitting: Internal Medicine

## 2018-09-05 VITALS — BP 128/86 | HR 111 | Ht 71.0 in | Wt 283.2 lb

## 2018-09-05 DIAGNOSIS — Z23 Encounter for immunization: Secondary | ICD-10-CM

## 2018-09-05 DIAGNOSIS — R0982 Postnasal drip: Secondary | ICD-10-CM | POA: Diagnosis not present

## 2018-09-05 DIAGNOSIS — J449 Chronic obstructive pulmonary disease, unspecified: Secondary | ICD-10-CM

## 2018-09-05 DIAGNOSIS — J9612 Chronic respiratory failure with hypercapnia: Secondary | ICD-10-CM | POA: Diagnosis not present

## 2018-09-05 MED ORDER — FLUTICASONE PROPIONATE 50 MCG/ACT NA SUSP: 2.0000 | Freq: Every day | NASAL | 2 refills | Status: DC

## 2018-09-05 NOTE — Patient Instructions (Signed)
Chronic hypoxic respiratory failure Severe COPD  -Stable disease -Continue oxygen and bronchodilator therapy as before -Okay for high-dose flu shot today as requested  Postnasal drip and chronic nasal allergies -Take a prescription for generic fluticasone with 12 refills -Continue Singulair  Follow-up 6 months or sooner if needed; CAT score at follow-up

## 2018-09-05 NOTE — Progress Notes (Signed)
#Obesity Body mass index is 39.91 kg/(m^2). on 03/26/2013  #Chronic sinusitis T sinus 2006   - IMPRESSION:  1. Ethmoid and frontal sinus dise Case. Sphenoid and maxillary sinuses are clear.  2. Patent ostiomeatal complexes.  #Smoking history  reports that he quit smoking about 2 years ago. His smoking use included Cigarettes. He has a 35 pack-year smoking history. He does not have any smokeless tobacco history on file.  #Multifactorial dyspnea -  Nuclear med stress test  2013 -  - negative per hx. Done by Dr Einar Gip per hx  #COPD - Severe/very severe COPD Spirometry 12/31/11 done at [facility]- fev1 0.8L/21%, Rati 39, 23% BD response on fev1 May 2013: Walk test in office : Pulse 88% at rest -> 1 lap x 185 feet - pulse ox 88% and HR 110 and very dyspneic, so stopped - Status post pulmonary rehabilitation fall 2013   #Evaluation for lung cancer : CXR 12/24/11   Clear lung fields - personally revieweed  #Recurrent COPD - May 2013: Outpatient treatment with doxycycline and prednisone - August 2013: Hospitalization for COPD exacerbation - January 2014: Outpatient treatment with antibiotics and prednisone - March 2015 : telephoine Rx with sinusitis, abx and prednisone - April 2015: telephoine Rx with abx and prednisone - June 2014 - VDRF with admission to ICU.        OV 06/08/2014  Chief Complaint  Patient presents with  . Follow-up    Pt states since using his neb meds his breathing has improved.C/o DOE. Denies CP/tightness and cough.    Followup from recent ICU hospitalization for respiratory failure. At this point in time he is significantly improved. He used to be bothered by significant sinus blockage for which he had an extensive workup including MRI and MRA but apparently after he started using nebulizers instead of inhalers this symptom has resolved. He still physically deconditioned. He did have home physical therapy but he found him down an operative go self-directed  exercises but after my counseling today he is open to reinitiating home physical therapy. This is because he still physically deconditioned. He uses oxygen and nebulizers on a compliant fashion. PFTs today show significant severe Gold stage IV lung disease with FEV1 of 0.92/24% and a ratio of 42. Total lung capacity of 122% and a DLCO that significantly impaired at 8.8/26%.  Labs reviewed June 2040 in the hospital at discharge he was anemic with a hemoglobin of 10 g percent. This has not been followed up. His chemistries were normal.  Past medical history: He did have a sleep study over the weekend. I do not know the results. This was ordered by Dr. Halford Chessman and I will set up followup for this to Dr. Jaclyn Prime 08/22/2015  Chief Complaint  Patient presents with  . Follow-up    Pt states his breathing was worse over the summer because of the heat and humidity. Pt c/o night time prod cough with white mucus, right ear pressure, intermittent sinus pressure. Pt denies CP/tightness.    Follow-up chronic respiratory failure with hypoxemia and hypercapnia associated with Gold stage IV COPD and morbid obesity.  In early 2015 he had life-threatening hospitalization from respiratory failure. Last visit in the office was July 2015. After that he failed to follow-up. He now presents with his wife. They want refills of his nebulizers and wants to up-to-date himself with the vaccines. The last 1 year they deny any hospitalizations or emergency room visits or urgent care visits or  new medical diagnoses. He is mostly sedentary and is house sitting but he does do limited activities of daily living such as changing clothes and self-feed. He did travel to the beach but he stayed in the hotel in the beach. Currently he does have some mild baseline cough with yellow sputum but this is unchanged compared to baseline. There are no new issues.   OV 09/05/2016  Chief Complaint  Patient presents with  . Follow-up    Pt  breathing is feels slightly worst due to weather. Breathing has changed since last seen. Uses nebulizer after excertion. No tightness, or congestion today. Coughing is sometimes dry and productive. coughing up clear mucus. Discuss flu shot, handy cap plaquer, and rescue inhaler.     Follow-up chronic approximately possibly hypercapnic respiratory failure from Gold stage IV COPD and morbid obesity and sleep apnea ruled out per history  Last seen one year ago. He was supposed to come back in 6 months but did not. Review of the chart suggests that he did not have any hospitalizations or emergency room visits. He and his wife confirmed that. He is extremely sedentary. He spends most of his day in bed playing video games and surfing the Internet. He has the ability to walk from room to room but is limited because of shortness of breath. He requests some help with activities of daily living such as changing clothes or taking a bath. Overall stable. He is asking for an increase in his nebulizer frequency. His upcoming oral surgery evaluation and might require anesthesia for it. Is requesting flu shot.   OV 04/30/2017  Chief Complaint  Patient presents with  . Follow-up    FOLLOW UP FOR Chronic respiratory failure with hyercapnia, COPD , severe     Follow-up chronic approximately possibly hypercapnic respiratory failure from Gold stage IV COPD and morbid obesity and sleep apnea ruled out per history   Routine follow-up for this patient with the above medical problems. Overall is doing well. His wife is here with him. There are no new exacerbations or admissions. His son got married in the interim. He did fine with his oral surgery and is now feeling better. He complains of occasional mucus being stuck in his chest but chest x-ray earlier this year was clear. Uses pure mist and able to clear the mucus    OV 01/07/2018  Chief Complaint  Patient presents with  . Follow-up    Pt has had elevated WBCs,  SOB is worse today than it has been, coughing at night. Denies any CP. DME: [facility], 2L at rest and 3L with movement.     End-stage COPD with hypoxemic respiratory failure on oxygen.  42-month follow-up but I last saw him in June 2018.  He presents with his wife.  I am meeting his daughter Raquel Sarna for the first time.  Overall they report that he is stable on his nebulizers and oxygen.  Apparently he has had a interim diagnosis of CLL.  Flu shot was held but after seeing hematologist he has been cleared to have a flu shot.  He wants to have a flu shot today.  With the cold weather in the last 2 days in the recent visit to the office today he is a little bit more short of breath than usual but does not think he is in an exacerbation.  COPD CAT score is 27       OV 09/05/2018  Subjective:  Patient ID: Isaac [name], male ,  DOB: 1956/06/22 , age 62 y.o. , MRN: [id number] , ADDRESS: [id number] [place] [address]   09/05/2018 -   Chief Complaint  Patient presents with  . Follow-up    Doing okay Sob is about the same.     HPI MABLE LASHLEY 62 y.o. -presents for follow-up of his severe COPD and chronic hypoxemic respiratory failure.  He is here with his wife as usual.  In terms of stable.  COPD CAT score is 28.  He tells me that his primary care physician is ill.  He tells me that he would need to call me for sinus issues.  Wife noticed that he is no longer taking his nasal fluticasone and once a refill.  He has been having some toe issues I recommended referral to a podiatrist but he declined.  He wants to have a high-dose flu shot and asked if it was okay because he was only 62 years old.  Otherwise no issues.  While at home awake is able to get by at 2 L or less    CAT COPD Symptom & Quality of Life Score ([place] trademark) 0 is no burden. 5 is highest burden 01/07/2018  09/05/2018   Never Cough -> Cough all the time 3 1  No phlegm in chest -> Chest is full of phlegm 2 1  No chest  tightness -> Chest feels very tight 1 1  No dyspnea for 1 flight stairs/hill -> Very dyspneic for 1 flight of stairs 5 5  No limitations for ADL at home -> Very limited with ADL at home 4 5  Confident leaving home -> Not at all confident leaving home 5 5  Sleep soundly -> Do not sleep soundly because of lung condition 3 5  Lots of Energy -> No energy at all 4 5  TOTAL Score (max 40)  27 28    ROS - per HPI     has a past medical history of Anxiety, COPD (chronic obstructive pulmonary disease) ([place]), GERD (gastroesophageal reflux disease), Hematuria, Hyperlipidemia, Hypertension, Insomnia, Nephrolithiasis, and Tobacco dependence.   reports that he quit smoking about 7 years ago. His smoking use included cigarettes. He has a 35.00 pack-year smoking history. He has never used smokeless tobacco.  Past Surgical History:  Procedure Laterality Date  . LITHOTRIPSY      Allergies  Allergen Reactions  . Codeine Itching    "jittery"  . Prozac [Fluoxetine Hcl]     confusion    Immunization History  Administered Date(s) Administered  . Influenza Split 09/27/2011, 08/20/2012, 07/27/2013  . Influenza, High Dose Seasonal PF 09/05/2018  . Influenza,inj,Quad PF,6+ Mos 08/22/2015, 10/26/2016, 01/07/2018  . Pneumococcal Conjugate-13 08/22/2015  . Pneumococcal Polysaccharide-23 07/23/2012    Family History  Problem Relation Age of Onset  . Heart disease Father   . Stroke Mother   . Leukemia Brother      Current Outpatient Medications:  .  albuterol (PROVENTIL) (2.5 MG/3ML) 0.083% nebulizer solution, Take 3 mLs (2.5 mg total) by nebulization as needed for wheezing or shortness of breath., Disp: 1080 mL, Rfl: 1 .  albuterol (PROVENTIL) (2.5 MG/3ML) 0.083% nebulizer solution, USE 1 VIAL IN NEBULIZER AS NEEDED FOR WHEEZING/SHORTNESS OF BREATH, Disp: 375 mL, Rfl: 2 .  albuterol (VENTOLIN HFA) 108 (90 Base) MCG/ACT inhaler, Inhale 2 puffs into the lungs every 4 (four) hours as needed for  wheezing or shortness of breath., Disp: 1 Inhaler, Rfl: 6 .  ALPRAZolam (XANAX) 0.25 MG tablet, Take 1  tablet (0.25 mg total) by mouth at bedtime as needed for anxiety., Disp: 7 tablet, Rfl: 0 .  bisoprolol (ZEBETA) 5 MG tablet, Take 1 tablet (5 mg total) by mouth daily. (Patient taking differently: Take 5 mg by mouth at bedtime. ), Disp: 30 tablet, Rfl: 3 .  budesonide (PULMICORT) 0.5 MG/2ML nebulizer solution, Take 2 mLs (0.5 mg total) by nebulization 2 (two) times daily., Disp: 120 mL, Rfl: 5 .  budesonide (PULMICORT) 0.5 MG/2ML nebulizer solution, TAKE 2 ML BY NEBULIZATION 2 TIMES DAILY., Disp: 360 mL, Rfl: 5 .  Cinnamon 500 MG capsule, Take 500 mg by mouth daily., Disp: , Rfl:  .  cloNIDine (CATAPRES) 0.2 MG tablet, Take 0.2 mg by mouth 3 (three) times daily., Disp: , Rfl:  .  ipratropium (ATROVENT) 0.02 % nebulizer solution, USE 1 VIAL IN NEBULIZER FOUR TIMES A DAY, Disp: 360 mL, Rfl: 6 .  ipratropium (ATROVENT) 0.02 % nebulizer solution, INHALE 1 VIAL IN NEBULIZER 4 TIMES A DAY, Disp: 360 mL, Rfl: 6 .  ipratropium-albuterol (DUONEB) 0.5-2.5 (3) MG/3ML SOLN, USE 1 VIAL IN NEBULIZER 4 TIMES A DAY, Disp: 360 mL, Rfl: 2 .  losartan-hydrochlorothiazide (HYZAAR) 50-12.5 MG per tablet, Take 1 tablet by mouth daily., Disp: , Rfl:  .  montelukast (SINGULAIR) 10 MG tablet, Take 10 mg by mouth at bedtime., Disp: , Rfl:  .  nystatin (MYCOSTATIN) 100000 UNIT/ML suspension, Take 5 mLs (500,000 Units total) by mouth 4 (four) times daily., Disp: 120 mL, Rfl: 0 .  omeprazole (PRILOSEC) 20 MG capsule, Take 2 capsules (40 mg total) by mouth daily. For GERD., Disp: 30 capsule, Rfl: 6 .  sodium chloride (OCEAN) 0.65 % SOLN nasal spray, Place 2 sprays into both nostrils as needed for congestion. (Patient taking differently: Place 2 sprays into both nostrils daily as needed for congestion. ), Disp: , Rfl: 0 .  fluticasone (FLONASE) 50 MCG/ACT nasal spray, Place 2 sprays into both nostrils daily., Disp: 16 g, Rfl:  2      Objective:   Vitals:   09/05/18 1129  BP: 128/86  Pulse: (!) 111  SpO2: 94%  Weight: 283 lb 3.2 oz (128.5 kg)  Height: 5\' 11"  (1.803 m)    Estimated body mass index is 39.5 kg/m as calculated from the following:   Height as of this encounter: 5\' 11"  (1.803 m).   Weight as of this encounter: 283 lb 3.2 oz (128.5 kg).  @WEIGHTCHANGE @  [employer]   09/05/18 1129  Weight: 283 lb 3.2 oz (128.5 kg)     Physical Exam  General Appearance:    Alert, cooperative, no distress, appears stated age - yes , Deconditioned looking - yes , OBESE  - yes, Sitting on Wheelchair -  yes  Head:    Normocephalic, without obvious abnormality, atraumatic  Eyes:    PERRL, conjunctiva/corneas clear,  Ears:    Normal TM's and external ear canals, both ears  Nose:   Nares normal, septum midline, mucosa normal, no drainage    or sinus tenderness. OXYGEN ON  - yes . Patient is @ 3-4LNC   Throat:   Lips, mucosa, and tongue normal; teeth and gums normal. Cyanosis on lips - no  Neck:   Supple, symmetrical, trachea midline, no adenopathy;    thyroid:  no enlargement/tenderness/nodules; no carotid   bruit or JVD  Back:     Symmetric, no curvature, ROM normal, no CVA tenderness  Lungs:     Distress - no , Wheeze no, Barrell Chest -  no, Purse lip breathing - no, Crackles - no   Chest Wall:    No tenderness or deformity.    Heart:    Regular rate and rhythm, S1 and S2 normal, no rub   or gallop, Murmur - no  Breast Exam:    NOT DONE  Abdomen:     Soft, non-tender, bowel sounds active all four quadrants,    no masses, no organomegaly. Visceral obesity - yes  Genitalia:   NOT DONE  Rectal:   NOT DONE  Extremities:   Extremities - normal, Has Cane - no, Clubbing - no, Edema - ++ chronic dry  Pulses:   2+ and symmetric all extremities  Skin:   Stigmata of Connective Tissue Disease - no  Lymph nodes:   Cervical, supraclavicular, and axillary nodes normal  Psychiatric:  Neurologic:   Pleasant - yes,  Anxious - n, Flat affect - yes  CAm-ICU - neg, Alert and Oriented x 3 - yes, Moves all 4s - yes, Speech - normal, Cognition - intact           Assessment:       ICD-10-CM   1. Encounter for immunization Z23 Flu vaccine HIGH DOSE PF       Plan:     Patient Instructions  Chronic hypoxic respiratory failure Severe COPD  -Stable disease -Continue oxygen and bronchodilator therapy as before -Okay for high-dose flu shot today as requested  Postnasal drip and chronic nasal allergies -Take a prescription for generic fluticasone with 12 refills -Continue Singulair  Follow-up 6 months or sooner if needed; CAT score at follow-up     SIGNATURE    Dr. Brand Males, M.D., F.C.C.P,  Pulmonary and Critical Care Medicine Staff Physician, [place] Director - Interstitial Lung Disease  Program  P[facility] [place] [place], [place], [address]  Pager: [phone], If no answer or between  15:00h - 7:00h: call [phone] Telephone: [phone]  12:07 PM 09/05/2018

## 2018-09-08 DIAGNOSIS — J9620 Acute and chronic respiratory failure, unspecified whether with hypoxia or hypercapnia: Secondary | ICD-10-CM | POA: Diagnosis not present

## 2018-09-08 DIAGNOSIS — J449 Chronic obstructive pulmonary disease, unspecified: Secondary | ICD-10-CM | POA: Diagnosis not present

## 2018-10-09 DIAGNOSIS — J449 Chronic obstructive pulmonary disease, unspecified: Secondary | ICD-10-CM | POA: Diagnosis not present

## 2018-10-09 DIAGNOSIS — J9620 Acute and chronic respiratory failure, unspecified whether with hypoxia or hypercapnia: Secondary | ICD-10-CM | POA: Diagnosis not present

## 2018-11-08 DIAGNOSIS — J449 Chronic obstructive pulmonary disease, unspecified: Secondary | ICD-10-CM | POA: Diagnosis not present

## 2018-11-08 DIAGNOSIS — J9620 Acute and chronic respiratory failure, unspecified whether with hypoxia or hypercapnia: Secondary | ICD-10-CM | POA: Diagnosis not present

## 2018-11-25 ENCOUNTER — Telehealth: Payer: Self-pay | Admitting: Internal Medicine

## 2018-11-25 MED ORDER — ALBUTEROL SULFATE (2.5 MG/3ML) 0.083% IN NEBU: INHALATION_SOLUTION | RESPIRATORY_TRACT | 2 refills | Status: DC

## 2018-11-25 NOTE — Telephone Encounter (Signed)
Called and spoke with patient, he stated he is needing a refill of his albuterol nebulizers. Refill sent. Nothing further needed.

## 2018-12-09 DIAGNOSIS — J449 Chronic obstructive pulmonary disease, unspecified: Secondary | ICD-10-CM | POA: Diagnosis not present

## 2018-12-12 ENCOUNTER — Inpatient Hospital Stay: Payer: 59 | Admitting: Oncology

## 2018-12-12 ENCOUNTER — Inpatient Hospital Stay: Payer: 59

## 2018-12-13 ENCOUNTER — Other Ambulatory Visit: Payer: Self-pay | Admitting: Internal Medicine

## 2019-01-09 DIAGNOSIS — J449 Chronic obstructive pulmonary disease, unspecified: Secondary | ICD-10-CM | POA: Diagnosis not present

## 2019-02-02 ENCOUNTER — Telehealth: Payer: Self-pay | Admitting: Internal Medicine

## 2019-02-02 ENCOUNTER — Other Ambulatory Visit: Payer: Self-pay | Admitting: Internal Medicine

## 2019-02-02 NOTE — Telephone Encounter (Signed)
Called and spoke with patients wife, she stated that the patient is developing thrush again and he would like the mouth wash to be called in. MR please advise, thank you.

## 2019-02-02 NOTE — Telephone Encounter (Signed)
Patient's wife calling for refill of nystatin, pt is developing thrush again. [facility] on [address]

## 2019-02-03 MED ORDER — NYSTATIN 100000 UNIT/ML MT SUSP: 5.0000 mL | Freq: Four times a day (QID) | OROMUCOSAL | 0 refills | Status: DC

## 2019-02-03 NOTE — Telephone Encounter (Signed)
#   Oral thrush - For Oral thrush: Take Suspension (swish and swallow): 500,000 units 4 times/day for 5 days; swish in the mouth and retain for as long as possible (several minutes) before swallowing. IF nystatin is in back order: alternatives are  A) clotrimazole troche (throt lozenge) 10mg dissolved and swish and swallow 5 times a day for 14 days. Or B  

## 2019-02-03 NOTE — Telephone Encounter (Signed)
Rx nystatin has been sent to pt's preferred pharmacy. Attempted to call pt's wife to let her know this had been done but unable to reach her. Left a detailed message on her machine letting her know that the Rx had been sent in for pt. Nothing further needed.

## 2019-02-13 ENCOUNTER — Other Ambulatory Visit: Payer: Self-pay | Admitting: Internal Medicine

## 2019-03-12 ENCOUNTER — Ambulatory Visit: Payer: Medicare Other | Admitting: Internal Medicine

## 2019-03-12 ENCOUNTER — Other Ambulatory Visit: Payer: Self-pay

## 2019-03-12 ENCOUNTER — Ambulatory Visit (INDEPENDENT_AMBULATORY_CARE_PROVIDER_SITE_OTHER): Payer: Medicare Other | Admitting: Pulmonary Disease

## 2019-03-12 ENCOUNTER — Encounter: Payer: Self-pay | Admitting: Pulmonary Disease

## 2019-03-12 DIAGNOSIS — J9612 Chronic respiratory failure with hypercapnia: Secondary | ICD-10-CM | POA: Diagnosis not present

## 2019-03-12 DIAGNOSIS — J449 Chronic obstructive pulmonary disease, unspecified: Secondary | ICD-10-CM | POA: Diagnosis not present

## 2019-03-12 NOTE — Progress Notes (Signed)
Virtual Visit via Telephone Note  I connected with Isaac [name] on 03/12/19 at  4:15 PM EDT by telephone and verified that I am speaking with the correct person using two identifiers.   I discussed the limitations, risks, security and privacy concerns of performing an evaluation and management service by telephone and the availability of in person appointments. I also discussed with the patient that there may be a patient responsible charge related to this service. The patient expressed understanding and agreed to proceed.   History of Present Illness: 63 year old male former smoker followed in our office for COPD and Chronic Respiratory Failure Smoking History: Former Smoker. Quit 2012. 35 pack year history.  Maintenance: Pulmicort Neb, Duoneb q6 hour Pt of: Dr. Chase Caller  Patient consented to consult via telephone: Yes  People present and their role in pt care: Pt   Chief complaint: COPD / Chronic Respiratory Failure   62 year old male former smoker followed in our office for very severe COPD as well as chronic respiratory failure.  Patient reports he has been adherent to taking his Pulmicort nebulized medications twice daily as well as DuoNeb nebulized medication every 6 hours.  Patient does not like to take duo nebs as he reports that causes a bad taste in his mouth.  Patient typically uses Atrovent nebs as well as albuterol nebs together to make a DuoNeb.  He reports that when he does this combination the taste is not as bad.  He never uses duo nebs and Atrovent at the same time.  Patient reports that his breathing has been stable over the last 6 months.  He continues to be maintained on 2 L O2 via nasal cannula.  Patient reporting that he is at his baseline with symptoms.  Patient denies cough fever body aches or chills.  Patient denies refill needs at this time.  MMRC - Breathlessness Score 3 - I stop for breath after walking about 100 yards or after a few minutes on level ground  (isle at grocery store is 132ft)   Observations/Objective:  06/08/2014-pulmonary function test- FVC 2.09 (42% predicted), postbronchodilator ratio 42, postbronchodilator FEV1 0.9 (24% predicted), no positive bronchodilator response, mid flow reversibility, DLCO 26 >>>Severe obstructive airways disease, severe restriction, severe diffusion defect  04/24/2014-echocardiogram- LV ejection fraction 85%, grade 1 diastolic dysfunction, mild LVH  No results found for: NITRICOXIDE  Assessment and Plan:  COPD, very severe (HCC) A:  COPD GOLD IV on 2015 PFT  MMRC 3 Maintained on pulmicort nebs BID and Duoneb q6  Denies saba use   P:  Continue Pulmicort BID  Contine Duoneb q6hr Continue O2 as precribed  6 month follow up with Dr. Chase Caller   Chronic respiratory failure with hypercapnia P:  Continue oxygen as prescribed  Monitor oxygen levels at home    Follow Up Instructions:  Return in about 6 months (around 09/11/2019), or if symptoms worsen or fail to improve, for Follow up with Dr. Purnell Shoemaker.    I discussed the assessment and treatment plan with the patient. The patient was provided an opportunity to ask questions and all were answered. The patient agreed with the plan and demonstrated an understanding of the instructions.   The patient was advised to call back or seek an in-person evaluation if the symptoms worsen or if the condition fails to improve as anticipated.  I provided 22 minutes of non-face-to-face time during this encounter.   Lauraine Rinne, NP

## 2019-03-12 NOTE — Assessment & Plan Note (Addendum)
A:  COPD GOLD IV on 2015 PFT  MMRC 3 Maintained on pulmicort nebs BID and Duoneb q6  Denies saba use   P:  Continue Pulmicort BID  Contine Duoneb q6hr Continue O2 as precribed  6 month follow up with Dr. Chase Caller

## 2019-03-12 NOTE — Patient Instructions (Addendum)
Continue Pulmicort nebulzed twice daily  Continue Duoneb nebulized take every 6 hours (4 times daily)  Only use your albuterol  as a rescue medication to be used if you can't catch your breath by resting or doing a relaxed purse lip breathing pattern.  - The less you use it, the better it will work when you need it. - Ok to use up to 2 puffs  every 4 hours if you must but call for immediate appointment if use goes up over your usual need - Don't leave home without it !!  (think of it like the spare tire for your car)      Note your daily symptoms > remember "red flags" for COPD:   >>>Increase in cough >>>increase in sputum production >>>increase in shortness of breath or activity  intolerance.   If you notice these symptoms, please call the office to be seen.     Continue oxygen therapy as prescribed  >>>maintain oxygen saturations greater than 88 percent  >>>if unable to maintain oxygen saturations please contact the office  >>>do not smoke with oxygen  >>>can use nasal saline gel or nasal saline rinses to moisturize nose if oxygen causes dryness   Notify PCP about occasional numbness in toes / fingers    Return in about 6 months (around 09/11/2019), or if symptoms worsen or fail to improve, for Follow up with Dr. Purnell Shoemaker.    Coronavirus (COVID-19) Are you at risk?  Are you at risk for the Coronavirus (COVID-19)?  To be considered HIGH RISK for Coronavirus (COVID-19), you have to meet the following criteria:  . Traveled to [place], [place], [place], [place] or [place]; or in the [place] to [place], [place], [place], or [place]; and have fever, cough, and shortness of breath within the last 2 weeks of travel OR . Been in close contact with a person diagnosed with COVID-19 within the last 2 weeks and have fever, cough, and shortness of breath . IF YOU DO NOT MEET THESE CRITERIA, YOU ARE CONSIDERED LOW RISK FOR COVID-19.  What to do if you are HIGH RISK for  COVID-19?  Marland Kitchen If you are having a medical emergency, call 911. . Seek medical care right away. Before you go to a doctor's office, urgent care or emergency department, call ahead and tell them about your recent travel, contact with someone diagnosed with COVID-19, and your symptoms. You should receive instructions from your physician's office regarding next steps of care.  . When you arrive at healthcare provider, tell the healthcare staff immediately you have returned from visiting [place], [place], [place], [place] or [place]; or traveled in the [place] to [place], [place], [place], or [place]; in the last two weeks or you have been in close contact with a person diagnosed with COVID-19 in the last 2 weeks.   . Tell the health care staff about your symptoms: fever, cough and shortness of breath. . After you have been seen by a medical provider, you will be either: o Tested for (COVID-19) and discharged home on quarantine except to seek medical care if symptoms worsen, and asked to  - Stay home and avoid contact with others until you get your results (4-5 days)  - Avoid travel on public transportation if possible (such as bus, train, or airplane) or o Sent to the Emergency Department by EMS for evaluation, COVID-19 testing, and possible admission depending on your condition and test results.  What to do if you  are LOW RISK for COVID-19?  Reduce your risk of any infection by using the same precautions used for avoiding the common cold or flu:  Marland Kitchen Wash your hands often with soap and warm water for at least 20 seconds.  If soap and water are not readily available, use an alcohol-based hand sanitizer with at least 60% alcohol.  . If coughing or sneezing, cover your mouth and nose by coughing or sneezing into the elbow areas of your shirt or coat, into a tissue or into your sleeve (not your hands). . Avoid shaking hands with others and consider head nods or verbal greetings only. . Avoid  touching your eyes, nose, or mouth with unwashed hands.  . Avoid close contact with people who are sick. . Avoid places or events with large numbers of people in one location, like concerts or sporting events. . Carefully consider travel plans you have or are making. . If you are planning any travel outside or inside the [place], visit the CDC's Travelers' Health webpage for the latest health notices. . If you have some symptoms but not all symptoms, continue to monitor at home and seek medical attention if your symptoms worsen. . If you are having a medical emergency, call 911.   [place] / e-Visit: [email or web address]         [facility]: [facility]: [phone]                   [facility]: [phone]           It is flu season:   >>> Best ways to protect herself from the flu: Receive the yearly flu vaccine, practice good hand hygiene washing with soap and also using hand sanitizer when available, eat a nutritious meals, get adequate rest, hydrate appropriately   Please contact the office if your symptoms worsen or you have concerns that you are not improving.   Thank you for choosing [facility] for your healthcare, and for allowing [place] to partner with you on your healthcare journey. I am thankful to be able to provide care to you today.   Isaac Quaker FNP-C    Chronic Obstructive Pulmonary Disease Chronic obstructive pulmonary disease (COPD) is a long-term (chronic) lung problem. When you have COPD, it is hard for air to get in and out of your lungs. Usually the condition gets worse over time, and your lungs will never return to normal. There are things you can do to keep yourself as healthy as possible.  Your doctor may treat your condition with: ? Medicines. ? Oxygen. ? Lung surgery.  Your doctor may also  recommend: ? Rehabilitation. This includes steps to make your body work better. It may involve a team of specialists. ? Quitting smoking, if you smoke. ? Exercise and changes to your diet. ? Comfort measures (palliative care). Follow these instructions at home: Medicines  Take over-the-counter and prescription medicines only as told by your doctor.  Talk to your doctor before taking any cough or allergy medicines. You may need to avoid medicines that cause your lungs to be dry. Lifestyle  If you smoke, stop. Smoking makes the problem worse. If you need help quitting, ask your doctor.  Avoid being around things that make your breathing worse. This may include smoke, chemicals, and fumes.  Stay active, but remember to rest as well.  Learn and use tips on how to  relax.  Make sure you get enough sleep. Most adults need at least 7 hours of sleep every night.  Eat healthy foods. Eat smaller meals more often. Rest before meals. Controlled breathing Learn and use tips on how to control your breathing as told by your doctor. Try:  Breathing in (inhaling) through your nose for 1 second. Then, pucker your lips and breath out (exhale) through your lips for 2 seconds.  Putting one hand on your belly (abdomen). Breathe in slowly through your nose for 1 second. Your hand on your belly should move out. Pucker your lips and breathe out slowly through your lips. Your hand on your belly should move in as you breathe out.  Controlled coughing Learn and use controlled coughing to clear mucus from your lungs. Follow these steps: 1. Lean your head a little forward. 2. Breathe in deeply. 3. Try to hold your breath for 3 seconds. 4. Keep your mouth slightly open while coughing 2 times. 5. Spit any mucus out into a tissue. 6. Rest and do the steps again 1 or 2 times as needed. General instructions  Make sure you get all the shots (vaccines) that your doctor recommends. Ask your doctor about a flu shot  and a pneumonia shot.  Use oxygen therapy and pulmonary rehabilitation if told by your doctor. If you need home oxygen therapy, ask your doctor if you should buy a tool to measure your oxygen level (oximeter).  Make a COPD action plan with your doctor. This helps you to know what to do if you feel worse than usual.  Manage any other conditions you have as told by your doctor.  Avoid going outside when it is very hot, cold, or humid.  Avoid people who have a sickness you can catch (contagious).  Keep all follow-up visits as told by your doctor. This is important. Contact a doctor if:  You cough up more mucus than usual.  There is a change in the color or thickness of the mucus.  It is harder to breathe than usual.  Your breathing is faster than usual.  You have trouble sleeping.  You need to use your medicines more often than usual.  You have trouble doing your normal activities such as getting dressed or walking around the house. Get help right away if:  You have shortness of breath while resting.  You have shortness of breath that stops you from: ? Being able to talk. ? Doing normal activities.  Your chest hurts for longer than 5 minutes.  Your skin color is more blue than usual.  Your pulse oximeter shows that you have low oxygen for longer than 5 minutes.  You have a fever.  You feel too tired to breathe normally. Summary  Chronic obstructive pulmonary disease (COPD) is a long-term lung problem.  The way your lungs work will never return to normal. Usually the condition gets worse over time. There are things you can do to keep yourself as healthy as possible.  Take over-the-counter and prescription medicines only as told by your doctor.  If you smoke, stop. Smoking makes the problem worse. This information is not intended to replace advice given to you by your health care provider. Make sure you discuss any questions you have with your health care  provider. Document Released: 04/30/2008 Document Revised: 12/17/2016 Document Reviewed: 12/17/2016 Elsevier Interactive Patient Education  2019 [employer] Oxygen Use, Adult When a medical condition keeps you from getting enough oxygen, your  health care provider may instruct you to take extra oxygen at home. Your health care provider will let you know:  When to take oxygen.  For how long to take oxygen.  How quickly oxygen should be delivered (flow rate), in liters per minute (LPM or L/M). Home oxygen can be given through:  A mask.  A nasal cannula. This is a device or tube that goes in the nostrils.  A transtracheal catheter. This is a small, flexible tube placed in the trachea.  A tracheostomy. This is a surgically made opening in the trachea. These devices are connected with tubing to an oxygen source, such as:  A tank. Tanks hold oxygen in gas form. They must be replaced when the oxygen is used up.  A liquid oxygen device. This holds oxygen in liquid form. It must be replaced when the oxygen is used up.  An oxygen concentrator machine. This filters oxygen in the room. It uses electricity, so you must have a backup cylinder of oxygen in case the power goes out. Supplies needed: To use oxygen, you will need:  A mask, nasal cannula, transtracheal catheter, or tracheostomy.  An oxygen tank, a liquid oxygen device, or an oxygen concentrator.  The tape that your health care provider recommends (optional). If you use a transtracheal catheter and your prescribed flow rate is 1 LPM or greater, you will also need a humidifier. Risks and complications  Fire. This can happen if the oxygen is exposed to a heat source, flame, or spark.  Injury to skin. This can happen if liquid oxygen touches your skin.  Organ damage. This can happen if you get too little oxygen. How to use oxygen Your health care provider or a representative from your [place] will show  you how to use your oxygen device. Follow her or his instructions. The instructions may look something like this: 1. Wash your hands. 2. If you use an oxygen concentrator, make sure it is plugged in. 3. Place one end of the tube into the port on the tank, device, or machine. 4. Place the mask over your nose and mouth. Or, place the nasal cannula and secure it with tape if instructed. If you use a tracheostomy or transtracheal catheter, connect it to the oxygen source as directed. 5. Make sure the liter-flow setting on the machine is at the level prescribed by your health care provider. 6. Turn on the machine or adjust the knob on the tank or device to the correct liter-flow setting. 7. When you are done, turn off and unplug the machine, or turn the knob to OFF. How to clean and care for the oxygen supplies Nasal cannula  Clean it with a warm, wet cloth daily or as needed.  Wash it with a liquid soap once a week.  Rinse it thoroughly once or twice a week.  Replace it every 2-4 weeks.  If you have an infection, such as a cold or pneumonia, change the cannula when you get better. Mask  Replace it every 2-4 weeks.  If you have an infection, such as a cold or pneumonia, change the mask when you get better. Humidifier bottle  Wash the bottle between each refill: ? Wash it with soap and warm water. ? Rinse it thoroughly. ? Disinfect it and its top. ? Air-dry it.  Make sure it is dry before you refill it. Oxygen concentrator  Clean the air filter at least twice a week according to directions from your home medical  equipment and service company.  Wipe down the cabinet every day. To do this: ? Unplug the unit. ? Wipe down the cabinet with a damp cloth. ? Dry the cabinet. Other equipment  Change any extra tubing every 1-3 months.  Follow instructions from your health care provider about taking care of any other equipment. Safety tips Fire safety tips   Keep your oxygen and  oxygen supplies at least 5 ft away from sources of heat, flames, and sparks at all times.  Do not allow smoking near your oxygen. Put up "no smoking" signs in your home. Avoid smoking areas when in public.  Do not use materials that can burn (are flammable) while you use oxygen.  When you go to a restaurant with portable oxygen, ask to be seated in the nonsmoking section.  Keep a Data processing manager close by. Let your fire department know that you have oxygen in your home.  Test your home smoke detectors regularly. Traveling  Secure your oxygen tank in the vehicle so that it does not move around. Follow instructions from your medical device company about how to safely secure your tank.  Make sure you have enough oxygen for the amount of time you will be away from home.  If you are planning air travel, contact the airline to find out if they allow the use of an approved portable oxygen concentrator. You may also need documents from your health care provider and medical device company before you travel. General safety tips  If you use an oxygen cylinder, make sure it is in a stand or secured to an object that will not move (fixed object).  If you use liquid oxygen, make sure its container is kept upright.  If you use an oxygen concentrator: ? Dance movement psychotherapist company. Make sure you are given priority service in the event that your power goes out. ? Avoid using extension cords, if possible. Follow these instructions at home:  Use oxygen only as told by your health care provider.  Do not use alcohol or other drugs that make you relax (sedating drugs) unless instructed. They can slow down your breathing rate and make it hard to get in enough oxygen.  Know how and when to order a refill of oxygen.  Always keep a spare tank of oxygen. Plan ahead for holidays when you may not be able to get a prescription filled.  Use water-based lubricants on your lips or nostrils. Do not use oil-based  products like petroleum jelly.  To prevent skin irritation on your cheeks or behind your ears, tuck some gauze under the tubing. Contact a health care provider if:  You get headaches often.  You have shortness of breath.  You have a lasting cough.  You have anxiety.  You are sleepy all the time.  You develop an illness that affects your breathing.  You cannot exercise at your regular level.  You are restless.  You have difficult or irregular breathing, and it is getting worse.  You have a fever.  You have persistent redness under your nose. Get help right away if:  You are confused.  You have blue lips or fingernails.  You are struggling to breathe. Summary  Your health care provider or a representative from your [place] will show you how to use your oxygen device. Follow her or his instructions.  If you use an oxygen concentrator, make sure it is plugged in.  Make sure the liter-flow setting on the machine is  at the level prescribed by your health care provider.  Keep your oxygen and oxygen supplies at least 5 ft away from sources of heat, flames, and sparks at all times. This information is not intended to replace advice given to you by your health care provider. Make sure you discuss any questions you have with your health care provider. Document Released: 02/02/2004 Document Revised: 05/01/2018 Document Reviewed: 06/05/2016 Elsevier Interactive Patient Education  2019 [employer].

## 2019-03-12 NOTE — Assessment & Plan Note (Signed)
P:  Continue oxygen as prescribed  Monitor oxygen levels at home

## 2019-03-17 ENCOUNTER — Other Ambulatory Visit: Payer: Self-pay | Admitting: Internal Medicine

## 2019-04-08 ENCOUNTER — Telehealth: Payer: Self-pay | Admitting: Internal Medicine

## 2019-04-08 NOTE — Telephone Encounter (Signed)
Called and spoke with pt who states his throat hurts when he swallows. Stated to pt since it was his throat that was the issue he needed to call PCP and pt verbalized understanding. Nothing further needed.

## 2019-05-19 ENCOUNTER — Other Ambulatory Visit: Payer: Self-pay

## 2019-05-19 ENCOUNTER — Encounter: Payer: Self-pay | Admitting: Podiatry

## 2019-05-19 ENCOUNTER — Ambulatory Visit (INDEPENDENT_AMBULATORY_CARE_PROVIDER_SITE_OTHER): Payer: Medicare Other | Admitting: Podiatry

## 2019-05-19 VITALS — Temp 98.6°F

## 2019-05-19 DIAGNOSIS — M79674 Pain in right toe(s): Secondary | ICD-10-CM

## 2019-05-19 DIAGNOSIS — M79675 Pain in left toe(s): Secondary | ICD-10-CM

## 2019-05-19 DIAGNOSIS — B351 Tinea unguium: Secondary | ICD-10-CM | POA: Diagnosis not present

## 2019-05-19 DIAGNOSIS — L608 Other nail disorders: Secondary | ICD-10-CM

## 2019-05-26 NOTE — Progress Notes (Signed)
Subjective:   Patient ID: Isaac [name], male   DOB: 63 y.o.   MRN: [id number]   HPI 63 year old male presents the office today for concerns of his right toenail becoming thickened discolored and starting to get darker discoloration.  No pain the nail he denies any redness or drainage.  He does still get some swelling around the area at times he did try over-the-counter fungus medicine which did not help.  He was needed to call the nails.  Denies any open sores.  He thinks he may have neuropathy as well.  He had some numbness but overall no radiating pain and he does not wake him up at night.    Review of Systems  All other systems reviewed and are negative.  Past Medical History:  Diagnosis Date  . Anxiety   . COPD (chronic obstructive pulmonary disease) ([place])   . GERD (gastroesophageal reflux disease)   . Hematuria   . Hyperlipidemia   . Hypertension   . Insomnia   . Nephrolithiasis   . Tobacco dependence     Past Surgical History:  Procedure Laterality Date  . LITHOTRIPSY       Current Outpatient Medications:  .  albuterol (PROVENTIL) (2.5 MG/3ML) 0.083% nebulizer solution, USE 1 VIAL IN NEBULIZER AS NEEDED FOR WHEEZING/SHORTNESS OF BREATH, Disp: 375 mL, Rfl: 2 .  albuterol (VENTOLIN HFA) 108 (90 Base) MCG/ACT inhaler, Inhale 2 puffs into the lungs every 4 (four) hours as needed for wheezing or shortness of breath., Disp: 1 Inhaler, Rfl: 6 .  ALPRAZolam (XANAX) 0.25 MG tablet, Take 1 tablet (0.25 mg total) by mouth at bedtime as needed for anxiety., Disp: 7 tablet, Rfl: 0 .  bisoprolol (ZEBETA) 5 MG tablet, Take 1 tablet (5 mg total) by mouth daily. (Patient taking differently: Take 5 mg by mouth at bedtime. ), Disp: 30 tablet, Rfl: 3 .  budesonide (PULMICORT) 0.5 MG/2ML nebulizer solution, Take 2 mLs (0.5 mg total) by nebulization 2 (two) times daily., Disp: 120 mL, Rfl: 5 .  budesonide (PULMICORT) 0.5 MG/2ML nebulizer solution, TAKE 2 ML BY NEBULIZATION 2 TIMES DAILY., Disp:  360 mL, Rfl: 5 .  Cinnamon 500 MG capsule, Take 500 mg by mouth daily., Disp: , Rfl:  .  cloNIDine (CATAPRES) 0.2 MG tablet, Take 0.2 mg by mouth 3 (three) times daily., Disp: , Rfl:  .  fluticasone (FLONASE) 50 MCG/ACT nasal spray, SPRAY 2 SPRAYS INTO EACH NOSTRIL EVERY DAY, Disp: 16 g, Rfl: 2 .  ipratropium (ATROVENT) 0.02 % nebulizer solution, USE 1 VIAL IN NEBULIZER FOUR TIMES A DAY, Disp: 360 mL, Rfl: 6 .  ipratropium (ATROVENT) 0.02 % nebulizer solution, INHALE 1 VIAL IN NEBULIZER 4 TIMES A DAY, Disp: 312.5 mL, Rfl: 6 .  ipratropium-albuterol (DUONEB) 0.5-2.5 (3) MG/3ML SOLN, USE 1 VIAL IN NEBULIZER 4 TIMES A DAY, Disp: 360 mL, Rfl: 2 .  losartan-hydrochlorothiazide (HYZAAR) 50-12.5 MG per tablet, Take 1 tablet by mouth daily., Disp: , Rfl:  .  montelukast (SINGULAIR) 10 MG tablet, Take 10 mg by mouth at bedtime., Disp: , Rfl:  .  nystatin (MYCOSTATIN) 100000 UNIT/ML suspension, Take 5 mLs (500,000 Units total) by mouth 4 (four) times daily., Disp: 120 mL, Rfl: 0 .  nystatin (MYCOSTATIN) 100000 UNIT/ML suspension, Take 5 mLs (500,000 Units total) by mouth 4 (four) times daily., Disp: 120 mL, Rfl: 0 .  omeprazole (PRILOSEC) 20 MG capsule, Take 2 capsules (40 mg total) by mouth daily. For GERD., Disp: 30 capsule, Rfl: 6 .  sodium chloride (OCEAN) 0.65 % SOLN nasal spray, Place 2 sprays into both nostrils as needed for congestion. (Patient taking differently: Place 2 sprays into both nostrils daily as needed for congestion. ), Disp: , Rfl: 0 .  valsartan-hydrochlorothiazide (DIOVAN-HCT) 160-12.5 MG tablet, 1 TABLET ONCE A DAY (REPLACES LOSARTAN/HYDROCHLOROTHIAZIDE) ORALLY 90, Disp: , Rfl:   Allergies  Allergen Reactions  . Codeine Itching    "jittery"  . Prozac [Fluoxetine Hcl]     confusion         Objective:  Physical Exam  General: AAO x3, NAD  Dermatological: Toenails appear to be hypertrophic, dystrophic with yellow-brown discoloration as well as some darkening discoloration.   This appears to be some mild dried blood underneath the toenail particularly on the right big toe.  There is no extension of any hyperpigmentation into the surrounding skin.  There is no edema, erythema, drainage or pus or any signs of infection noted today.  No open lesions.  Vascular: Dorsalis Pedis artery and Posterior Tibial artery pedal pulses are 2/4 bilateral with immedate capillary fill time. There is no pain with calf compression, swelling, warmth, erythema.   Neruologic: Mild decreased Semmes-Weinstein monofilament.  Musculoskeletal: No gross boney pedal deformities bilateral. No pain, crepitus, or limitation noted with foot and ankle range of motion bilateral. Muscular strength 5/5 in all groups tested bilateral.     Assessment:   63 year old male with symptomatic onychomycosis, nail discoloration     Plan:  -Treatment options discussed including all alternatives, risks, and complications -Etiology of symptoms were discussed -The nails are very damaged at this point there is some dried blood present.  Debrided the nails without any complications or bleeding.  We discussed care of the nail fungus as well as coloration but I think this point the nails are damaged and not sure how helpful this will be.  Monitoring signs or symptoms of infection.  Trula Slade DPM

## 2019-06-24 ENCOUNTER — Other Ambulatory Visit: Payer: Self-pay | Admitting: Internal Medicine

## 2019-08-12 ENCOUNTER — Other Ambulatory Visit: Payer: Self-pay | Admitting: Internal Medicine

## 2019-08-16 ENCOUNTER — Other Ambulatory Visit: Payer: Self-pay | Admitting: Internal Medicine

## 2019-09-10 ENCOUNTER — Other Ambulatory Visit: Payer: Self-pay | Admitting: Internal Medicine

## 2019-09-25 ENCOUNTER — Other Ambulatory Visit: Payer: Self-pay | Admitting: Internal Medicine

## 2019-09-25 DIAGNOSIS — Z13220 Encounter for screening for lipoid disorders: Secondary | ICD-10-CM | POA: Diagnosis not present

## 2019-09-25 DIAGNOSIS — R634 Abnormal weight loss: Secondary | ICD-10-CM | POA: Diagnosis not present

## 2019-09-25 DIAGNOSIS — Z125 Encounter for screening for malignant neoplasm of prostate: Secondary | ICD-10-CM | POA: Diagnosis not present

## 2019-09-25 DIAGNOSIS — Z23 Encounter for immunization: Secondary | ICD-10-CM | POA: Diagnosis not present

## 2019-09-25 DIAGNOSIS — E119 Type 2 diabetes mellitus without complications: Secondary | ICD-10-CM | POA: Diagnosis not present

## 2019-09-25 DIAGNOSIS — Z0000 Encounter for general adult medical examination without abnormal findings: Secondary | ICD-10-CM | POA: Diagnosis not present

## 2019-10-02 ENCOUNTER — Other Ambulatory Visit: Payer: Self-pay | Admitting: Internal Medicine

## 2019-11-06 ENCOUNTER — Ambulatory Visit (INDEPENDENT_AMBULATORY_CARE_PROVIDER_SITE_OTHER): Payer: BC Managed Care – PPO | Admitting: Internal Medicine

## 2019-11-06 ENCOUNTER — Ambulatory Visit: Payer: Medicare Other | Admitting: Internal Medicine

## 2019-11-06 DIAGNOSIS — R0982 Postnasal drip: Secondary | ICD-10-CM

## 2019-11-06 NOTE — Patient Instructions (Signed)
ICD-10-CM   1. Postnasal drip  R09.82     Nasal blockage from weather etc.,  Plan Recommend hygience saline wash in addition to other treatment covid-19 vaccine when available  Followup  6-77months to pulmonary clinic at [facility] for routine 15 min visit

## 2019-11-06 NOTE — Progress Notes (Signed)
#Obesity Body mass index is 39.91 kg/(m^2). on 03/26/2013  #Chronic sinusitis T sinus 2006   - IMPRESSION:  1. Ethmoid and frontal sinus dise Case. Sphenoid and maxillary sinuses are clear.  2. Patent ostiomeatal complexes.  #Smoking history  reports that he quit smoking about 2 years ago. His smoking use included Cigarettes. He has a 35 pack-year smoking history. He does not have any smokeless tobacco history on file.  #Multifactorial dyspnea -  Nuclear med stress test  2013 -  - negative per hx. Done by Dr Einar Gip per hx  #COPD - Severe/very severe COPD Spirometry 12/31/11 done at [facility]- fev1 0.8L/21%, Rati 39, 23% BD response on fev1 May 2013: Walk test in office : Pulse 88% at rest -> 1 lap x 185 feet - pulse ox 88% and HR 110 and very dyspneic, so stopped - Status post pulmonary rehabilitation fall 2013   #Evaluation for lung cancer : CXR 12/24/11   Clear lung fields - personally revieweed  #Recurrent COPD - May 2013: Outpatient treatment with doxycycline and prednisone - August 2013: Hospitalization for COPD exacerbation - January 2014: Outpatient treatment with antibiotics and prednisone - March 2015 : telephoine Rx with sinusitis, abx and prednisone - April 2015: telephoine Rx with abx and prednisone - June 2014 - VDRF with admission to ICU.        OV 06/08/2014  Chief Complaint  Patient presents with  . Follow-up    Pt states since using his neb meds his breathing has improved.C/o DOE. Denies CP/tightness and cough.    Followup from recent ICU hospitalization for respiratory failure. At this point in time he is significantly improved. He used to be bothered by significant sinus blockage for which he had an extensive workup including MRI and MRA but apparently after he started using nebulizers instead of inhalers this symptom has resolved. He still physically deconditioned. He did have home physical therapy but he found him down an operative go self-directed  exercises but after my counseling today he is open to reinitiating home physical therapy. This is because he still physically deconditioned. He uses oxygen and nebulizers on a compliant fashion. PFTs today show significant severe Gold stage IV lung disease with FEV1 of 0.92/24% and a ratio of 42. Total lung capacity of 122% and a DLCO that significantly impaired at 8.8/26%.  Labs reviewed June 2040 in the hospital at discharge he was anemic with a hemoglobin of 10 g percent. This has not been followed up. His chemistries were normal.  Past medical history: He did have a sleep study over the weekend. I do not know the results. This was ordered by Dr. Halford Chessman and I will set up followup for this to Dr. Jaclyn Prime 08/22/2015  Chief Complaint  Patient presents with  . Follow-up    Pt states his breathing was worse over the summer because of the heat and humidity. Pt c/o night time prod cough with white mucus, right ear pressure, intermittent sinus pressure. Pt denies CP/tightness.    Follow-up chronic respiratory failure with hypoxemia and hypercapnia associated with Gold stage IV COPD and morbid obesity.  In early 2015 he had life-threatening hospitalization from respiratory failure. Last visit in the office was July 2015. After that he failed to follow-up. He now presents with his wife. They want refills of his nebulizers and wants to up-to-date himself with the vaccines. The last 1 year they deny any hospitalizations or emergency room visits or urgent care visits  or new medical diagnoses. He is mostly sedentary and is house sitting but he does do limited activities of daily living such as changing clothes and self-feed. He did travel to the beach but he stayed in the hotel in the beach. Currently he does have some mild baseline cough with yellow sputum but this is unchanged compared to baseline. There are no new issues.   OV 09/05/2016  Chief Complaint  Patient presents with  . Follow-up    Pt  breathing is feels slightly worst due to weather. Breathing has changed since last seen. Uses nebulizer after excertion. No tightness, or congestion today. Coughing is sometimes dry and productive. coughing up clear mucus. Discuss flu shot, handy cap plaquer, and rescue inhaler.     Follow-up chronic approximately possibly hypercapnic respiratory failure from Gold stage IV COPD and morbid obesity and sleep apnea ruled out per history  Last seen one year ago. He was supposed to come back in 6 months but did not. Review of the chart suggests that he did not have any hospitalizations or emergency room visits. He and his wife confirmed that. He is extremely sedentary. He spends most of his day in bed playing video games and surfing the Internet. He has the ability to walk from room to room but is limited because of shortness of breath. He requests some help with activities of daily living such as changing clothes or taking a bath. Overall stable. He is asking for an increase in his nebulizer frequency. His upcoming oral surgery evaluation and might require anesthesia for it. Is requesting flu shot.   OV 04/30/2017  Chief Complaint  Patient presents with  . Follow-up    FOLLOW UP FOR Chronic respiratory failure with hyercapnia, COPD , severe     Follow-up chronic approximately possibly hypercapnic respiratory failure from Gold stage IV COPD and morbid obesity and sleep apnea ruled out per history   Routine follow-up for this patient with the above medical problems. Overall is doing well. His wife is here with him. There are no new exacerbations or admissions. His son got married in the interim. He did fine with his oral surgery and is now feeling better. He complains of occasional mucus being stuck in his chest but chest x-ray earlier this year was clear. Uses pure mist and able to clear the mucus    OV 01/07/2018  Chief Complaint  Patient presents with  . Follow-up    Pt has had elevated WBCs,  SOB is worse today than it has been, coughing at night. Denies any CP. DME: [facility], 2L at rest and 3L with movement.     End-stage COPD with hypoxemic respiratory failure on oxygen.  4-month follow-up but I last saw him in June 2018.  He presents with his wife.  I am meeting his daughter Raquel Sarna for the first time.  Overall they report that he is stable on his nebulizers and oxygen.  Apparently he has had a interim diagnosis of CLL.  Flu shot was held but after seeing hematologist he has been cleared to have a flu shot.  He wants to have a flu shot today.  With the cold weather in the last 2 days in the recent visit to the office today he is a little bit more short of breath than usual but does not think he is in an exacerbation.  COPD CAT score is 27       OV 09/05/2018  Subjective:  Patient ID: Isaac [name], male ,  DOB: 1956-04-19 , age 31 y.o. , MRN: [id number] , ADDRESS: [address] [place] [address]   09/05/2018 -   Chief Complaint  Patient presents with  . Follow-up    Doing okay Sob is about the same.     HPI Isaac [name] 63 y.o. -presents for follow-up of his severe COPD and chronic hypoxemic respiratory failure.  He is here with his wife as usual.  In terms of stable.  COPD CAT score is 28.  He tells me that his primary care physician is ill.  He tells me that he would need to call me for sinus issues.  Wife noticed that he is no longer taking his nasal fluticasone and once a refill.  He has been having some toe issues I recommended referral to a podiatrist but he declined.  He wants to have a high-dose flu shot and asked if it was okay because he was only 63 years old.  Otherwise no issues.  While at home awake is able to get by at 2 L or less       OV 11/06/2019 - telephne visit. Limits, risks and benefit explained. Patient identified with 2 person identifier.   Subjective:  Patient ID: Isaac [name], male , DOB: May 13, 1956 , age 10 y.o. , MRN: [id number] ,  ADDRESS: [place] [address]   11/06/2019 -   Chief Complaint  Patient presents with  . Follow-up    pt reports of sinus pressure (feels this is related to turning heat on) and mild sob with exertion. on 2L cont.      HPI Isaac [name] 63 y.o. - reports sinuss issues since weather gold cold and has turned heat on. Has vaoprizer and using nebs and alak selzer. But nose is still getting blockd. Breathing is stable. Using flonase and it helps a lot. Not using netti pot. Was thinking about saline nasal rinse. In past has used netti pot. Has had flu shot. Is doing very low risk covid-19 activities only      CAT COPD Symptom & Quality of Life Score ([place]) 0 is no burden. 5 is highest burden 01/07/2018  09/05/2018   Never Cough -> Cough all the time 3 1  No phlegm in chest -> Chest is full of phlegm 2 1  No chest tightness -> Chest feels very tight 1 1  No dyspnea for 1 flight stairs/hill -> Very dyspneic for 1 flight of stairs 5 5  No limitations for ADL at home -> Very limited with ADL at home 4 5  Confident leaving home -> Not at all confident leaving home 5 5  Sleep soundly -> Do not sleep soundly because of lung condition 3 5  Lots of Energy -> No energy at all 4 5  TOTAL Score (max 40)  27 28   ROS - per HPI     has a past medical history of Anxiety, COPD (chronic obstructive pulmonary disease) ([place]), GERD (gastroesophageal reflux disease), Hematuria, Hyperlipidemia, Hypertension, Insomnia, Nephrolithiasis, and Tobacco dependence.   reports that he quit smoking about 8 years ago. His smoking use included cigarettes. He has a 35.00 pack-year smoking history. He has never used smokeless tobacco.  Past Surgical History:  Procedure Laterality Date  . LITHOTRIPSY      Allergies  Allergen Reactions  . Codeine Itching    "jittery"  . Prozac [Fluoxetine Hcl]     confusion    Immunization History  Administered Date(s) Administered  .  Influenza  Split 09/27/2011, 08/20/2012, 07/27/2013  . Influenza, High Dose Seasonal PF 09/05/2018  . Influenza,inj,Quad PF,6+ Mos 08/22/2015, 10/26/2016, 01/07/2018  . Influenza-Unspecified 10/12/2019  . Pneumococcal Conjugate-13 08/22/2015  . Pneumococcal Polysaccharide-23 07/23/2012    Family History  Problem Relation Age of Onset  . Heart disease Father   . Stroke Mother   . Leukemia Brother      Current Outpatient Medications:  .  albuterol (PROVENTIL) (2.5 MG/3ML) 0.083% nebulizer solution, USE 1 VIAL IN NEBULIZER AS NEEDED FOR WHEEZING/SHORTNESS OF BREATH, Disp: 375 mL, Rfl: 2 .  albuterol (VENTOLIN HFA) 108 (90 Base) MCG/ACT inhaler, Inhale 2 puffs into the lungs every 4 (four) hours as needed for wheezing or shortness of breath., Disp: 1 Inhaler, Rfl: 6 .  ALPRAZolam (XANAX) 0.25 MG tablet, Take 1 tablet (0.25 mg total) by mouth at bedtime as needed for anxiety., Disp: 7 tablet, Rfl: 0 .  bisoprolol (ZEBETA) 5 MG tablet, Take 1 tablet (5 mg total) by mouth daily. (Patient taking differently: Take 5 mg by mouth at bedtime. ), Disp: 30 tablet, Rfl: 3 .  budesonide (PULMICORT) 0.5 MG/2ML nebulizer solution, TAKE 2 ML BY NEBULIZATION 2 TIMES DAILY., Disp: 360 mL, Rfl: 5 .  Cinnamon 500 MG capsule, Take 500 mg by mouth daily., Disp: , Rfl:  .  cloNIDine (CATAPRES) 0.2 MG tablet, Take 0.2 mg by mouth 3 (three) times daily., Disp: , Rfl:  .  fluticasone (FLONASE) 50 MCG/ACT nasal spray, SPRAY 2 SPRAYS INTO EACH NOSTRIL EVERY DAY, Disp: 48 mL, Rfl: 1 .  ipratropium (ATROVENT) 0.02 % nebulizer solution, USE 1 VIAL IN NEBULIZER FOUR TIMES A DAY, Disp: 360 mL, Rfl: 6 .  losartan-hydrochlorothiazide (HYZAAR) 50-12.5 MG per tablet, Take 1 tablet by mouth daily., Disp: , Rfl:  .  montelukast (SINGULAIR) 10 MG tablet, Take 10 mg by mouth at bedtime., Disp: , Rfl:  .  nystatin (MYCOSTATIN) 100000 UNIT/ML suspension, Take 5 mLs (500,000 Units total) by mouth 4 (four) times daily., Disp: 120 mL, Rfl: 0 .   nystatin (MYCOSTATIN) 100000 UNIT/ML suspension, Take 5 mLs (500,000 Units total) by mouth 4 (four) times daily., Disp: 120 mL, Rfl: 0 .  omeprazole (PRILOSEC) 20 MG capsule, Take 2 capsules (40 mg total) by mouth daily. For GERD., Disp: 30 capsule, Rfl: 6 .  sodium chloride (OCEAN) 0.65 % SOLN nasal spray, Place 2 sprays into both nostrils as needed for congestion. (Patient taking differently: Place 2 sprays into both nostrils daily as needed for congestion. ), Disp: , Rfl: 0 .  valsartan-hydrochlorothiazide (DIOVAN-HCT) 160-12.5 MG tablet, 1 TABLET ONCE A DAY (REPLACES LOSARTAN/HYDROCHLOROTHIAZIDE) ORALLY 90, Disp: , Rfl:       Objective:   There were no vitals filed for this visit.  Estimated body mass index is 39.5 kg/m as calculated from the following:   Height as of 09/05/18: 5\' 11"  (1.803 m).   Weight as of 09/05/18: 283 lb 3.2 oz (128.5 kg).  @WEIGHTCHANGE @  There were no vitals filed for this visit.   Physical Exam  Sounded normal phone               ICD-10-CM   1. Postnasal drip  R09.82     Patient Instructions     ICD-10-CM   1. Postnasal drip  R09.82     Nasal blockage from weather etc.,  Plan Recommend hygience saline wash in addition to other treatment covid-19 vaccine when available  Followup  6-37months to pulmonary clinic at [facility] for routine  15 min visit      SIGNATURE    Dr. Brand Males, M.D., F.C.C.P,  Pulmonary and Critical Care Medicine Staff Physician, [place] Director - Interstitial Lung Disease  Program  Pulmonary [place] at [place], [place], [address]  Pager: [phone], If no answer or between  15:00h - 7:00h: call [phone] Telephone: [phone]  10:52 AM 11/06/2019                                                          Assessment:     No diagnosis found.     Plan:     There are no Patient Instructions on file for  this visit.    SIGNATURE    Dr. Brand Males, M.D., F.C.C.P,  Pulmonary and Critical Care Medicine Staff Physician, [place] Director - Interstitial Lung Disease  Program  Pulmonary [place] at [place], [place], [address]  Pager: [phone], If no answer or between  15:00h - 7:00h: call [phone] Telephone: [phone]  10:45 AM 11/06/2019

## 2019-11-12 ENCOUNTER — Other Ambulatory Visit: Payer: Self-pay | Admitting: Internal Medicine

## 2019-11-17 ENCOUNTER — Ambulatory Visit: Payer: Medicare Other | Admitting: Podiatry

## 2019-12-08 ENCOUNTER — Telehealth: Payer: Self-pay | Admitting: Internal Medicine

## 2019-12-08 MED ORDER — ALBUTEROL SULFATE HFA 108 (90 BASE) MCG/ACT IN AERS: 2.0000 | INHALATION_SPRAY | RESPIRATORY_TRACT | 5 refills | Status: DC | PRN

## 2019-12-08 NOTE — Telephone Encounter (Signed)
Rx for ventolin inhaler has been sent to pt's preferred pharmacy. Called and spoke with pt letting him know this had been done and he verbalized understanding. Nothing further needed.

## 2020-01-05 ENCOUNTER — Ambulatory Visit: Payer: Medicare Other | Admitting: Podiatry

## 2020-01-12 ENCOUNTER — Other Ambulatory Visit: Payer: Self-pay | Admitting: Internal Medicine

## 2020-04-14 ENCOUNTER — Telehealth: Payer: Self-pay | Admitting: Internal Medicine

## 2020-04-14 ENCOUNTER — Ambulatory Visit (INDEPENDENT_AMBULATORY_CARE_PROVIDER_SITE_OTHER): Payer: Medicare Other | Admitting: Adult Health

## 2020-04-14 DIAGNOSIS — J9612 Chronic respiratory failure with hypercapnia: Secondary | ICD-10-CM

## 2020-04-14 DIAGNOSIS — J449 Chronic obstructive pulmonary disease, unspecified: Secondary | ICD-10-CM | POA: Diagnosis not present

## 2020-04-14 MED ORDER — AZITHROMYCIN 250 MG PO TABS: ORAL_TABLET | ORAL | 0 refills | Status: AC

## 2020-04-14 NOTE — Telephone Encounter (Signed)
Called spoke with patient. He states he was feeling unwell prior to getting the covid vaccine. He had a lot of questions, we scheduled a televisit with Tammy Parrett at 3pm.   Follow up scheduled with MR on 05/23/20 as requested. Nothing further needed at this time

## 2020-04-14 NOTE — Telephone Encounter (Signed)
Patient status post second COVID-19 vaccine this is a likely symptoms related to that.  Patient needs to continue to clinically monitor.  Okay to hydrate and rest.  If the patient is requesting additional recommendations can be scheduled for a televisit to further discuss.Patient also should be scheduled for follow-up with Dr. Chase Caller as he was last seen in December/2020.Wyn Quaker, FNP

## 2020-04-14 NOTE — Telephone Encounter (Signed)
See last note   Isaac [name]

## 2020-04-14 NOTE — Telephone Encounter (Signed)
Primary Pulmonologist: Ramaswamy Last office visit and with whom: 11/06/2019 What do we see them for (pulmonary problems): post nasal drip and chronic respiratory failure Last OV assessment/plan: none  1. Postnasal drip  R09.82     Nasal blockage from weather etc.,  Plan Recommend hygience saline wash in addition to other treatment covid-19 vaccine when available  Followup  6-69months to pulmonary clinic at [facility] for routine 15 min visit   Was appointment offered to patient (explain)?  None available until next week Tuesday    Reason for call: patient states he was feeling stomach cramping, cough (little production doesn't know color or thickness) , feels out of breath. 2 days ago Patient taking all regular nebulizer's and albuterol neb and inhaler 4-5x/d. Also mucinex and alka seltzer cold and chest OTC.  Patient also states he received his 2nd covid vaccine yesterday 04/13/20 pfizer.   Aaron Edelman please advise

## 2020-04-14 NOTE — Progress Notes (Signed)
Virtual Visit via Telephone Note  I connected with [place] Ranger on 04/14/20 at  3:00 PM EDT by telephone and verified that I am speaking with the correct person using two identifiers.  Location: Patient: Home  Provider: Office    I discussed the limitations, risks, security and privacy concerns of performing an evaluation and management service by telephone and the availability of in person appointments. I also discussed with the patient that there may be a patient responsible charge related to this service. The patient expressed understanding and agreed to proceed.   History of Present Illness: 64 yo male former smoker followed for severe COPD and Chronic Hypoxemic Respiratory Failure on Oxygen .   Today's televisit is for an acute office.  Patient complains over the last 3 days of increased cough congestion with thick mucus.  Patient says his stomach is also felt little upset.  No nausea vomiting or diarrhea.  Just has a low appetite.  Denies any loss of taste or smell.  No fever. Patient says symptoms increased yesterday after receiving his second Covid vaccine.  He denies any chest pain, hemoptysis, calf pain or increased edema.  Patient Active Problem List   Diagnosis Date Noted  . Oral thrush 04/30/2017  . Dysfunction of right eustachian tube 01/19/2016  . Left ear impacted cerumen 01/19/2016  . COPD, very severe ([place]) 08/22/2015  . Chronic respiratory failure with hypercapnia ([place]) 06/08/2014  . Unspecified sleep apnea 06/08/2014  . Chronic diastolic heart failure ([place]) 05/11/2014  . Acute-on-chronic respiratory failure ([place]) 04/24/2014  . Sinusitis 04/13/2014  . Renal insufficiency 07/07/2012  . Hypertension 07/07/2012  . COPD exacerbation ([place]) 07/04/2012  . COPD, severe ([place]) 04/25/2012  . Sleep apnea 04/25/2012  . Obesity 04/25/2012    Current Outpatient Medications on File Prior to Visit  Medication Sig Dispense Refill  . albuterol (PROVENTIL) (2.5 MG/3ML) 0.083%  nebulizer solution USE 1 VIAL IN NEBULIZER AS NEEDED FOR WHEEZING/SHORTNESS OF BREATH 375 mL 2  . albuterol (VENTOLIN HFA) 108 (90 Base) MCG/ACT inhaler Inhale 2 puffs into the lungs every 4 (four) hours as needed for wheezing or shortness of breath. 8 g 5  . ALPRAZolam (XANAX) 0.25 MG tablet Take 1 tablet (0.25 mg total) by mouth at bedtime as needed for anxiety. 7 tablet 0  . bisoprolol (ZEBETA) 5 MG tablet Take 1 tablet (5 mg total) by mouth daily. (Patient taking differently: Take 5 mg by mouth at bedtime. ) 30 tablet 3  . budesonide (PULMICORT) 0.5 MG/2ML nebulizer solution TAKE 2 ML BY NEBULIZATION 2 TIMES DAILY. 360 mL 5  . Cinnamon 500 MG capsule Take 500 mg by mouth daily.    . cloNIDine (CATAPRES) 0.2 MG tablet Take 0.2 mg by mouth 3 (three) times daily.    . fluticasone (FLONASE) 50 MCG/ACT nasal spray SPRAY 2 SPRAYS INTO EACH NOSTRIL EVERY DAY 48 mL 1  . ipratropium (ATROVENT) 0.02 % nebulizer solution INHALE 1 VIAL IN NEBULIZER 4 TIMES A DAY 312.5 mL 11  . losartan-hydrochlorothiazide (HYZAAR) 50-12.5 MG per tablet Take 1 tablet by mouth daily.    . montelukast (SINGULAIR) 10 MG tablet Take 10 mg by mouth at bedtime.    Marland Kitchen nystatin (MYCOSTATIN) 100000 UNIT/ML suspension Take 5 mLs (500,000 Units total) by mouth 4 (four) times daily. 120 mL 0  . nystatin (MYCOSTATIN) 100000 UNIT/ML suspension Take 5 mLs (500,000 Units total) by mouth 4 (four) times daily. 120 mL 0  . omeprazole (PRILOSEC) 20 MG capsule Take 2 capsules (  40 mg total) by mouth daily. For GERD. 30 capsule 6  . sodium chloride (OCEAN) 0.65 % SOLN nasal spray Place 2 sprays into both nostrils as needed for congestion. (Patient taking differently: Place 2 sprays into both nostrils daily as needed for congestion. )  0  . valsartan-hydrochlorothiazide (DIOVAN-HCT) 160-12.5 MG tablet 1 TABLET ONCE A DAY (REPLACES LOSARTAN/HYDROCHLOROTHIAZIDE) ORALLY 90     No current facility-administered medications on file prior to visit.        Observations/Objective: Speaks in full sentences with no audible distress or wheezing.  Assessment and Plan: Acute COPD exacerbation. Hold on steroids at this time.  Close office follow-up.  Patient is advised if symptoms do not improve he is to contact us sooner for a sooner for an office follow-up.  And or seek emergency room care  Chronic Hypoxic respiratory failure compensated on oxygen at 2 L  Plan  Patient Instructions  Zpack take as directed . Take with food .  Mucinex DM Twice daily  As needed  Cough/congestion  Continue on Budesonide Neb Twice daily   Continue on Duoneb Four times a day  .  Continue on  Oxygen 2l/m .  Follow up with Dr. Chase Caller in 4 weeks as planned and As needed   Please contact office for sooner follow up if symptoms do not improve or worsen or seek emergency care        Follow Up Instructions: Follow up in 4 weeks and As needed     I discussed the assessment and treatment plan with the patient. The patient was provided an opportunity to ask questions and all were answered. The patient agreed with the plan and demonstrated an understanding of the instructions.   The patient was advised to call back or seek an in-person evaluation if the symptoms worsen or if the condition fails to improve as anticipated.  I provided 22 minutes of non-face-to-face time during this encounter.   Isaac Edison, NP

## 2020-04-14 NOTE — Patient Instructions (Addendum)
Zpack take as directed . Take with food .  Mucinex DM Twice daily  As needed  Cough/congestion  Continue on Budesonide Neb Twice daily   Continue on Duoneb Four times a day  .  Continue on  Oxygen 2l/m .  Follow up with Dr. Chase Caller in 4 weeks as planned and As needed   Please contact office for sooner follow up if symptoms do not improve or worsen or seek emergency care

## 2020-05-13 ENCOUNTER — Other Ambulatory Visit: Payer: Self-pay | Admitting: Internal Medicine

## 2020-05-23 ENCOUNTER — Ambulatory Visit: Payer: Medicare Other | Admitting: Internal Medicine

## 2020-06-09 ENCOUNTER — Ambulatory Visit: Payer: Medicare Other | Admitting: Pulmonary Disease

## 2020-06-14 DIAGNOSIS — Z1329 Encounter for screening for other suspected endocrine disorder: Secondary | ICD-10-CM | POA: Diagnosis not present

## 2020-06-14 DIAGNOSIS — Z125 Encounter for screening for malignant neoplasm of prostate: Secondary | ICD-10-CM | POA: Diagnosis not present

## 2020-06-14 DIAGNOSIS — I10 Essential (primary) hypertension: Secondary | ICD-10-CM | POA: Diagnosis not present

## 2020-06-14 DIAGNOSIS — L989 Disorder of the skin and subcutaneous tissue, unspecified: Secondary | ICD-10-CM | POA: Diagnosis not present

## 2020-06-14 DIAGNOSIS — Z0000 Encounter for general adult medical examination without abnormal findings: Secondary | ICD-10-CM | POA: Diagnosis not present

## 2020-06-14 DIAGNOSIS — E119 Type 2 diabetes mellitus without complications: Secondary | ICD-10-CM | POA: Diagnosis not present

## 2020-06-14 DIAGNOSIS — E7800 Pure hypercholesterolemia, unspecified: Secondary | ICD-10-CM | POA: Diagnosis not present

## 2020-06-14 DIAGNOSIS — J449 Chronic obstructive pulmonary disease, unspecified: Secondary | ICD-10-CM | POA: Diagnosis not present

## 2020-06-14 DIAGNOSIS — K219 Gastro-esophageal reflux disease without esophagitis: Secondary | ICD-10-CM | POA: Diagnosis not present

## 2020-06-17 ENCOUNTER — Telehealth: Payer: Self-pay | Admitting: Pulmonary Disease

## 2020-06-17 MED ORDER — BUDESONIDE 0.5 MG/2ML IN SUSP: RESPIRATORY_TRACT | 5 refills | Status: DC

## 2020-06-17 NOTE — Telephone Encounter (Signed)
Patient contacted, refill for Pulmicort sent to preferred pharmacy.

## 2020-07-08 ENCOUNTER — Other Ambulatory Visit: Payer: Self-pay | Admitting: Oncology

## 2020-07-08 ENCOUNTER — Telehealth: Payer: Self-pay | Admitting: Oncology

## 2020-07-08 DIAGNOSIS — D72829 Elevated white blood cell count, unspecified: Secondary | ICD-10-CM

## 2020-07-15 ENCOUNTER — Inpatient Hospital Stay: Payer: Medicare Other | Admitting: Oncology

## 2020-07-15 ENCOUNTER — Inpatient Hospital Stay: Payer: Medicare Other

## 2020-08-05 ENCOUNTER — Other Ambulatory Visit: Payer: Self-pay | Admitting: Internal Medicine

## 2020-08-12 ENCOUNTER — Other Ambulatory Visit: Payer: Self-pay

## 2020-08-12 ENCOUNTER — Encounter: Payer: Self-pay | Admitting: Pulmonary Disease

## 2020-08-12 ENCOUNTER — Ambulatory Visit (INDEPENDENT_AMBULATORY_CARE_PROVIDER_SITE_OTHER): Payer: BC Managed Care – PPO | Admitting: Pulmonary Disease

## 2020-08-12 VITALS — BP 128/82 | HR 94 | Temp 98.0°F | Ht 71.0 in | Wt 270.4 lb

## 2020-08-12 DIAGNOSIS — J449 Chronic obstructive pulmonary disease, unspecified: Secondary | ICD-10-CM | POA: Diagnosis not present

## 2020-08-12 DIAGNOSIS — Z23 Encounter for immunization: Secondary | ICD-10-CM

## 2020-08-12 DIAGNOSIS — Z0000 Encounter for general adult medical examination without abnormal findings: Secondary | ICD-10-CM | POA: Diagnosis not present

## 2020-08-12 DIAGNOSIS — J9612 Chronic respiratory failure with hypercapnia: Secondary | ICD-10-CM | POA: Diagnosis not present

## 2020-08-12 NOTE — Progress Notes (Signed)
@Patient  ID: Isaac [name], male    DOB: 05-Dec-1955, 64 y.o.   MRN: [id number]  Chief Complaint  Patient presents with   Follow-up    pt is uynable to walked today to qualify for oxygen    Referring provider: Jani Gravel, MD  HPI:  64 year old male former smoker followed in our office for COPD and Chronic Respiratory Failure  Smoking History: Former Smoker. Quit 2012. 35 pack year history.  Maintenance: Pulmicort Neb, Duoneb q6 hour Pt of: Dr. Chase Caller  08/12/2020  - Visit   64 year old male former smoker followed in our office for COPD and chronic respiratory failure.  He is followed by Dr. Chase Caller.  He was last seen in our office in May/2021 by TP NP.  He is up-to-date with his COVID-19 vaccinations.  His DME company [employer] has notified our office that he needs to requalify for oxygen today and this needs to be reflected in the notes.  We will work on evaluating this.  Patient with known COPD and chronic respiratory failure and currently benefits from being maintained on oxygen.  Patient reports her last antibiotics that were prescribed was in May/2021.  He feels that his breathing is at baseline.  May be slightly worsened shortness of breath.  He is currently maintained on 4 L pulsed O2 with his Energen.  He wears 3 L continuous at night.  He needs to requalify for oxygen today.  He admits that he continues to lead a very sedentary lifestyle.  He does not exercise.  He continues to utilize his nebulized maintenance medications as prescribed.  Questionaires / Pulmonary Flowsheets:   ACT:  No flowsheet data found.  MMRC: No flowsheet data found.  Epworth:  No flowsheet data found.  Tests:   06/08/2014-pulmonary function test- FVC 2.09 (42% predicted), postbronchodilator ratio 42, postbronchodilator FEV1 0.9 (24% predicted), no positive bronchodilator response, mid flow reversibility, DLCO 26 >>>Severe obstructive airways disease, severe restriction, severe  diffusion defect  04/24/2014-echocardiogram- LV ejection fraction 01%, grade 1 diastolic dysfunction, mild LVH   FENO:  No results found for: NITRICOXIDE  PFT: PFT Results Latest Ref Rng & Units 06/08/2014  FVC-Pre L 2.09  FVC-Predicted Pre % 42  FVC-Post L 2.17  FVC-Predicted Post % 44  Pre FEV1/FVC % % 39  Post FEV1/FCV % % 42  FEV1-Pre L 0.83  FEV1-Predicted Pre % 22  FEV1-Post L 0.90  DLCO uncorrected ml/min/mmHg 8.83  DLCO UNC% % 26  DLVA Predicted % 47    WALK:  No flowsheet data found.  Imaging: No results found.  Lab Results:  CBC    Component Value Date/Time   WBC 26.8 (H) 06/05/2018 1440   WBC 28.7 (H) 12/17/2016 0839   RBC 5.10 06/05/2018 1440   HGB 14.8 06/05/2018 1440   HCT 44.5 06/05/2018 1440   PLT 220 06/05/2018 1440   MCV 87.3 06/05/2018 1440   MCH 29.0 06/05/2018 1440   MCHC 33.3 06/05/2018 1440   RDW 13.7 06/05/2018 1440   LYMPHSABS 16.9 (H) 06/05/2018 1440   MONOABS 2.0 (H) 06/05/2018 1440   EOSABS 0.2 06/05/2018 1440   BASOSABS 0.1 06/05/2018 1440    BMET    Component Value Date/Time   NA 139 12/17/2016 0839   K 4.1 12/17/2016 0839   CL 101 12/17/2016 0839   CO2 30 12/17/2016 0839   GLUCOSE 184 (H) 12/17/2016 0839   BUN 7 12/17/2016 0839   CREATININE 0.96 12/17/2016 0839   CALCIUM 8.5 (L)  12/17/2016 0839   GFRNONAA >60 12/17/2016 0839   GFRAA >60 12/17/2016 0839    BNP    Component Value Date/Time   BNP 31.2 12/17/2016 0839    ProBNP    Component Value Date/Time   PROBNP 28.5 07/04/2012 1600    Specialty Problems      Pulmonary Problems   COPD, severe (HCC)   Sleep apnea   COPD exacerbation (HCC)   Sinusitis   Acute-on-chronic respiratory failure (HCC)   Chronic respiratory failure with hypercapnia (HCC)   Unspecified sleep apnea   COPD, very severe ([place])    06/08/2014-pulmonary function test- FVC 2.09 (42% predicted), postbronchodilator ratio 42, postbronchodilator FEV1 0.9 (24% predicted), no positive  bronchodilator response, mid flow reversibility, DLCO 26 >>>Severe obstructive airways disease, severe restriction, severe diffusion defect          Allergies  Allergen Reactions   Codeine Itching    "jittery"   Prozac [Fluoxetine Hcl]     confusion    Immunization History  Administered Date(s) Administered   Influenza Split 09/27/2011, 08/20/2012, 07/27/2013   Influenza, High Dose Seasonal PF 09/05/2018   Influenza,inj,Quad PF,6+ Mos 08/22/2015, 10/26/2016, 01/07/2018, 08/12/2020   Influenza-Unspecified 10/12/2019   PFIZER SARS-COV-2 Vaccination 03/23/2020, 04/13/2020   Pneumococcal Conjugate-13 08/22/2015   Pneumococcal Polysaccharide-23 07/23/2012    Past Medical History:  Diagnosis Date   Anxiety    COPD (chronic obstructive pulmonary disease) ([place])    GERD (gastroesophageal reflux disease)    Hematuria    Hyperlipidemia    Hypertension    Insomnia    Nephrolithiasis    Tobacco dependence     Tobacco History: Social History   Tobacco Use  Smoking Status Former Smoker   Packs/day: 1.00   Years: 35.00   Pack years: 35.00   Types: Cigarettes   Quit date: 12/07/2008   Years since quitting: 11.6  Smokeless Tobacco Never Used   Counseling given: Not Answered   Continue to not smoke  Outpatient Encounter Medications as of 08/12/2020  Medication Sig   albuterol (PROVENTIL) (2.5 MG/3ML) 0.083% nebulizer solution USE 1 VIAL IN NEBULIZER AS NEEDED FOR WHEEZING/SHORTNESS OF BREATH   albuterol (VENTOLIN HFA) 108 (90 Base) MCG/ACT inhaler Inhale 2 puffs into the lungs every 4 (four) hours as needed for wheezing or shortness of breath.   bisoprolol (ZEBETA) 5 MG tablet Take 5 mg by mouth daily. Two times daily   budesonide (PULMICORT) 0.5 MG/2ML nebulizer solution TAKE 2 ML BY NEBULIZATION 2 TIMES DAILY.   Cinnamon 500 MG capsule Take 500 mg by mouth daily.   cloNIDine (CATAPRES) 0.2 MG tablet Take 0.2 mg by mouth 3 (three) times  daily.   fluticasone (FLONASE) 50 MCG/ACT nasal spray SPRAY 2 SPRAYS INTO EACH NOSTRIL EVERY DAY   ipratropium (ATROVENT) 0.02 % nebulizer solution INHALE 1 VIAL IN NEBULIZER 4 TIMES A DAY   losartan-hydrochlorothiazide (HYZAAR) 50-12.5 MG per tablet Take 1 tablet by mouth daily.   montelukast (SINGULAIR) 10 MG tablet Take 10 mg by mouth at bedtime.   nystatin (MYCOSTATIN) 100000 UNIT/ML suspension Take 5 mLs (500,000 Units total) by mouth 4 (four) times daily.   nystatin (MYCOSTATIN) 100000 UNIT/ML suspension Take 5 mLs (500,000 Units total) by mouth 4 (four) times daily.   omeprazole (PRILOSEC) 20 MG capsule Take 2 capsules (40 mg total) by mouth daily. For GERD.   sodium chloride (OCEAN) 0.65 % SOLN nasal spray Place 2 sprays into both nostrils as needed for congestion. (Patient taking differently: Place 2 sprays into  both nostrils daily as needed for congestion. )   valsartan-hydrochlorothiazide (DIOVAN-HCT) 160-12.5 MG tablet 1 TABLET ONCE A DAY (REPLACES LOSARTAN/HYDROCHLOROTHIAZIDE) ORALLY 90   [DISCONTINUED] ALPRAZolam (XANAX) 0.25 MG tablet Take 1 tablet (0.25 mg total) by mouth at bedtime as needed for anxiety.   [DISCONTINUED] bisoprolol (ZEBETA) 5 MG tablet Take 1 tablet (5 mg total) by mouth daily. (Patient taking differently: Take 5 mg by mouth at bedtime. )   No facility-administered encounter medications on file as of 08/12/2020.     Review of Systems  Review of Systems  Constitutional: Positive for fatigue. Negative for activity change, chills, fever and unexpected weight change.  HENT: Positive for congestion. Negative for postnasal drip, rhinorrhea, sinus pressure, sinus pain and sore throat.   Eyes: Negative.   Respiratory: Positive for shortness of breath. Negative for cough and wheezing.   Cardiovascular: Negative for chest pain and palpitations.  Gastrointestinal: Negative for constipation, diarrhea, nausea and vomiting.  Endocrine: Negative.     Genitourinary: Negative.   Musculoskeletal: Negative.   Skin: Negative.   Neurological: Negative for dizziness and headaches.  Psychiatric/Behavioral: Negative.  Negative for dysphoric mood. The patient is not nervous/anxious.   All other systems reviewed and are negative.    Physical Exam  BP 128/82 (Cuff Size: Normal)    Pulse 94    Temp 98 F (36.7 C) (Oral)    Ht 5\' 11"  (1.803 m)    Wt 270 lb 6.4 oz (122.7 kg)    SpO2 97%    BMI 37.71 kg/m   Wt Readings from Last 5 Encounters:  08/12/20 270 lb 6.4 oz (122.7 kg)  09/05/18 283 lb 3.2 oz (128.5 kg)  06/05/18 290 lb 14.4 oz (132 kg)  01/07/18 295 lb 9.6 oz (134.1 kg)  12/06/17 296 lb 14.4 oz (134.7 kg)    BMI Readings from Last 5 Encounters:  08/12/20 37.71 kg/m  09/05/18 39.50 kg/m  06/05/18 40.57 kg/m  01/07/18 41.23 kg/m  12/06/17 41.41 kg/m     Physical Exam Vitals and nursing note reviewed.  Constitutional:      General: He is not in acute distress.    Appearance: Normal appearance. He is obese.  HENT:     Head: Normocephalic and atraumatic.     Right Ear: Hearing and external ear normal.     Left Ear: Hearing and external ear normal.     Nose: Nose normal. No mucosal edema or rhinorrhea.     Right Turbinates: Not enlarged.     Left Turbinates: Not enlarged.     Mouth/Throat:     Mouth: Mucous membranes are dry.     Pharynx: Oropharynx is clear. No oropharyngeal exudate.  Eyes:     Pupils: Pupils are equal, round, and reactive to light.  Cardiovascular:     Rate and Rhythm: Normal rate and regular rhythm.     Pulses: Normal pulses.     Heart sounds: Normal heart sounds. No murmur heard.   Pulmonary:     Effort: Pulmonary effort is normal.     Breath sounds: Normal breath sounds. No decreased breath sounds, wheezing or rales.  Musculoskeletal:     Cervical back: Normal range of motion.     Right lower leg: No edema.     Left lower leg: No edema.  Lymphadenopathy:     Cervical: No cervical  adenopathy.  Skin:    General: Skin is warm and dry.     Capillary Refill: Capillary refill takes less than 2  seconds.     Findings: No erythema or rash.  Neurological:     General: No focal deficit present.     Mental Status: He is alert and oriented to person, place, and time.     Motor: No weakness.     Coordination: Coordination normal.     Gait: Gait abnormal.  Psychiatric:        Mood and Affect: Mood normal.        Behavior: Behavior normal. Behavior is cooperative.        Thought Content: Thought content normal.        Judgment: Judgment normal.       Assessment & Plan:   Patient was walked in office today and quickly required O2 in order to maintain oxygen saturations above 88%.  Patient benefits clinically from being maintained on oxygen therapy is important given his COPD Gold stage IV lung disease as well as chronic respiratory failure.  COPD, very severe ([place]) Plan: Continue Pulmicort nebulized meds twice daily Continue duo nebs every 6 hours Continue oxygen therapy Walk today in office, requalified for oxygen Flu vaccine today Referral to pulmonary rehab 34-month follow-up with Dr. Chase Caller  Chronic respiratory failure with hypercapnia Plan: Walk today in office Continue oxygen therapy as prescribed 3 L of O2 with physical exertion, 4 L of O2 pulsed with POC with physical exertion  Healthcare maintenance Plan:  Flu vaccine     Return in about 3 months (around 11/11/2020), or if symptoms worsen or fail to improve, for Follow up with Dr. Purnell Shoemaker.   Lauraine Rinne, NP 08/12/2020   This appointment required 32 minutes of patient care (this includes precharting, chart review, review of results, face-to-face care, etc.).

## 2020-08-12 NOTE — Patient Instructions (Addendum)
You were seen today by Lauraine Rinne, NP  for:   1. Chronic respiratory failure with hypercapnia (HCC)  Walk today in office, the requalified for oxygen  Continue to maintain 3 L of O2 with physical exertion Okay to maintain 4 L pulsed of O2 with physical exertion with your POC  Continue oxygen therapy as prescribed  >>>maintain oxygen saturations greater than 88 percent  >>>if unable to maintain oxygen saturations please contact the office  >>>do not smoke with oxygen  >>>can use nasal saline gel or nasal saline rinses to moisturize nose if oxygen causes dryness   2. COPD, very severe ([place])  Continue breathing medications as prescribed  We will refer you to pulmonary rehab today  Note your daily symptoms > remember "red flags" for COPD:   >>>Increase in cough >>>increase in sputum production >>>increase in shortness of breath or activity  intolerance.   If you notice these symptoms, please call the office to be seen.   Flu vaccine today   We recommend today:  Orders Placed This Encounter  Procedures  . Flu Vaccine QUAD 36+ mos IM (Fluarix, Quad PF)   Orders Placed This Encounter  Procedures  . Flu Vaccine QUAD 36+ mos IM (Fluarix, Quad PF)   No orders of the defined types were placed in this encounter.   Follow Up:    Return in about 3 months (around 11/11/2020), or if symptoms worsen or fail to improve, for Follow up with Dr. Purnell Shoemaker.   Notification of test results are managed in the following manner: If there are  any recommendations or changes to the  plan of care discussed in office today,  we will contact you and let you know what they are. If you do not hear from [place], then your results are normal and you can view them through your  MyChart account , or a letter will be sent to you. Thank you again for trusting [place] with your care  - Thank you, [facility]    It is flu season:   >>> Best ways to protect herself from the flu: Receive the yearly flu  vaccine, practice good hand hygiene washing with soap and also using hand sanitizer when available, eat a nutritious meals, get adequate rest, hydrate appropriately       Please contact the office if your symptoms worsen or you have concerns that you are not improving.   Thank you for choosing [facility] for your healthcare, and for allowing [place] to partner with you on your healthcare journey. I am thankful to be able to provide care to you today.   Wyn Quaker FNP-C    COPD and Physical Activity Chronic obstructive pulmonary disease (COPD) is a long-term (chronic) condition that affects the lungs. COPD is a general term that can be used to describe many different lung problems that cause lung swelling (inflammation) and limit airflow, including chronic bronchitis and emphysema. The main symptom of COPD is shortness of breath, which makes it harder to do even simple tasks. This can also make it harder to exercise and be active. Talk with your health care provider about treatments to help you breathe better and actions you can take to prevent breathing problems during physical activity. What are the benefits of exercising with COPD? Exercising regularly is an important part of a healthy lifestyle. You can still exercise and do physical activities even though you have COPD. Exercise and physical activity improve your shortness of breath by increasing blood  flow (circulation). This causes your heart to pump more oxygen through your body. Moderate exercise can improve your:  Oxygen use.  Energy level.  Shortness of breath.  Strength in your breathing muscles.  Heart health.  Sleep.  Self-esteem and feelings of self-worth.  Depression, stress, and anxiety levels. Exercise can benefit everyone with COPD. The severity of your disease may affect how hard you can exercise, especially at first, but everyone can benefit. Talk with your health care provider about how much exercise is  safe for you, and which activities and exercises are safe for you. What actions can I take to prevent breathing problems during physical activity?  Sign up for a pulmonary rehabilitation program. This type of program may include: ? Education about lung diseases. ? Exercise classes that teach you how to exercise and be more active while improving your breathing. This usually involves:  Exercise using your lower extremities, such as a stationary bicycle.  About 30 minutes of exercise, 2 to 5 times per week, for 6 to 12 weeks  Strength training, such as push ups or leg lifts. ? Nutrition education. ? Group classes in which you can talk with others who also have COPD and learn ways to manage stress.  If you use an oxygen tank, you should use it while you exercise. Work with your health care provider to adjust your oxygen for your physical activity. Your resting flow rate is different from your flow rate during physical activity.  While you are exercising: ? Take slow breaths. ? Pace yourself and do not try to go too fast. ? Purse your lips while breathing out. Pursing your lips is similar to a kissing or whistling position. ? If doing exercise that uses a quick burst of effort, such as weight lifting:  Breathe in before starting the exercise.  Breathe out during the hardest part of the exercise (such as raising the weights). Where to find support You can find support for exercising with COPD from:  Your health care provider.  A pulmonary rehabilitation program.  Your local health department or community health programs.  Support groups, online or in-person. Your health care provider may be able to recommend support groups. Where to find more information You can find more information about exercising with COPD from:  American Lung Association: ClassInsider.se.  COPD Foundation: https://www.rivera.net/. Contact a health care provider if:  Your symptoms get worse.  You have chest  pain.  You have nausea.  You have a fever.  You have trouble talking or catching your breath.  You want to start a new exercise program or a new activity. Summary  COPD is a general term that can be used to describe many different lung problems that cause lung swelling (inflammation) and limit airflow. This includes chronic bronchitis and emphysema.  Exercise and physical activity improve your shortness of breath by increasing blood flow (circulation). This causes your heart to provide more oxygen to your body.  Contact your health care provider before starting any exercise program or new activity. Ask your health care provider what exercises and activities are safe for you. This information is not intended to replace advice given to you by your health care provider. Make sure you discuss any questions you have with your health care provider. Document Revised: 03/04/2019 Document Reviewed: 12/05/2017 Elsevier Patient Education  2020 [employer].

## 2020-08-12 NOTE — Assessment & Plan Note (Signed)
Plan:  Flu vaccine

## 2020-08-12 NOTE — Assessment & Plan Note (Signed)
Plan: Walk today in office Continue oxygen therapy as prescribed 3 L of O2 with physical exertion, 4 L of O2 pulsed with POC with physical exertion

## 2020-08-12 NOTE — Assessment & Plan Note (Addendum)
Plan: Continue Pulmicort nebulized meds twice daily Continue duo nebs every 6 hours Continue oxygen therapy Walk today in office, requalified for oxygen Flu vaccine today Referral to pulmonary rehab 43-month follow-up with Dr. Chase Caller

## 2020-08-12 NOTE — Addendum Note (Signed)
Addended by: Satira Sark D on: 08/12/2020 02:59 PM   Modules accepted: Orders

## 2020-08-15 ENCOUNTER — Telehealth (HOSPITAL_COMMUNITY): Payer: Self-pay | Admitting: *Deleted

## 2020-08-15 NOTE — Telephone Encounter (Signed)
Asked by support staff, Isaac [name] to speak with pt and his wife after the remark was made that he is mostly wheelchair bound.  Talked extensively with pt and his wife to determine pt functional capacity and appropriateness to participate in pulmonary rehab.  Pt participated in 2013.  Pt is quite limited in his mobility and mostly chair bound and does not leave the home.  Pt seen in the pulmonary office today and completed a walk test. Per pt he was only able to stand and his oxygen level dropped into the 80's.  Asked if pt could transfer onto equipment, he stated that he was doubtful he could without heavy assist. Pt also mentioned that he has a portable nebulizer that he takes with him to use when he becomes sob.  Advised that due to the aerosolization of droplets, a nebulizer machines  are not allowed in  Pulmonary rehab due to guidelines established by the Pulmonary rehab medical director for a group setting. After much discussion, patient and his wife felt that he was not ready for pulmonary rehab.  Suggested exercises he could do in the home to help with increasing strength and stamina. Pt has elastic bands, hand weights and what sound like a version of recumbent stepper.  Gave pt general guidelines to increase his activity in shorter segment throughout the day.  Patient to notify pulmonary rehab when he feels he is at a point he can exercise and no longer need as frequent treatments - as he feels he is getting over "something". [name], BSN Cardiac and Training and development officer

## 2020-08-16 NOTE — Telephone Encounter (Signed)
He has not completed the 6-minute walk test yet. That is why you cannot see the results. He has some well aware of what happened when he was in the clinic. My point is simply that the Atrovent is ordered for him to take every 6 hours. So he would not need to use it while at pulmonary rehab.  Wyn Quaker, FNP

## 2020-10-18 ENCOUNTER — Other Ambulatory Visit: Payer: Self-pay | Admitting: Internal Medicine

## 2020-11-02 DIAGNOSIS — J449 Chronic obstructive pulmonary disease, unspecified: Secondary | ICD-10-CM | POA: Diagnosis not present

## 2020-11-02 DIAGNOSIS — J9620 Acute and chronic respiratory failure, unspecified whether with hypoxia or hypercapnia: Secondary | ICD-10-CM | POA: Diagnosis not present

## 2020-11-09 ENCOUNTER — Other Ambulatory Visit: Payer: Self-pay | Admitting: Internal Medicine

## 2020-12-03 DIAGNOSIS — J9620 Acute and chronic respiratory failure, unspecified whether with hypoxia or hypercapnia: Secondary | ICD-10-CM | POA: Diagnosis not present

## 2020-12-03 DIAGNOSIS — J449 Chronic obstructive pulmonary disease, unspecified: Secondary | ICD-10-CM | POA: Diagnosis not present

## 2020-12-08 ENCOUNTER — Inpatient Hospital Stay (HOSPITAL_COMMUNITY)
Admission: EM | Admit: 2020-12-08 | Discharge: 2020-12-19 | DRG: 004 | Disposition: A | Discharge status: Rehabilitation Facility | Payer: BC Managed Care – PPO | Attending: Pulmonary Disease | Admitting: Pulmonary Disease

## 2020-12-08 ENCOUNTER — Emergency Department (HOSPITAL_COMMUNITY): Payer: BC Managed Care – PPO

## 2020-12-08 ENCOUNTER — Inpatient Hospital Stay (HOSPITAL_COMMUNITY): Payer: BC Managed Care – PPO

## 2020-12-08 DIAGNOSIS — K219 Gastro-esophageal reflux disease without esophagitis: Secondary | ICD-10-CM | POA: Diagnosis not present

## 2020-12-08 DIAGNOSIS — J9601 Acute respiratory failure with hypoxia: Secondary | ICD-10-CM | POA: Diagnosis not present

## 2020-12-08 DIAGNOSIS — Z4682 Encounter for fitting and adjustment of non-vascular catheter: Secondary | ICD-10-CM | POA: Diagnosis not present

## 2020-12-08 DIAGNOSIS — E785 Hyperlipidemia, unspecified: Secondary | ICD-10-CM | POA: Diagnosis present

## 2020-12-08 DIAGNOSIS — Z806 Family history of leukemia: Secondary | ICD-10-CM

## 2020-12-08 DIAGNOSIS — J9622 Acute and chronic respiratory failure with hypercapnia: Secondary | ICD-10-CM | POA: Diagnosis not present

## 2020-12-08 DIAGNOSIS — J9501 Hemorrhage from tracheostomy stoma: Secondary | ICD-10-CM | POA: Diagnosis not present

## 2020-12-08 DIAGNOSIS — R069 Unspecified abnormalities of breathing: Secondary | ICD-10-CM

## 2020-12-08 DIAGNOSIS — J189 Pneumonia, unspecified organism: Secondary | ICD-10-CM

## 2020-12-08 DIAGNOSIS — I517 Cardiomegaly: Secondary | ICD-10-CM | POA: Diagnosis not present

## 2020-12-08 DIAGNOSIS — M7989 Other specified soft tissue disorders: Secondary | ICD-10-CM | POA: Diagnosis not present

## 2020-12-08 DIAGNOSIS — E870 Hyperosmolality and hypernatremia: Secondary | ICD-10-CM | POA: Diagnosis not present

## 2020-12-08 DIAGNOSIS — R601 Generalized edema: Secondary | ICD-10-CM | POA: Diagnosis not present

## 2020-12-08 DIAGNOSIS — J440 Chronic obstructive pulmonary disease with acute lower respiratory infection: Secondary | ICD-10-CM | POA: Diagnosis not present

## 2020-12-08 DIAGNOSIS — E883 Tumor lysis syndrome: Secondary | ICD-10-CM | POA: Diagnosis present

## 2020-12-08 DIAGNOSIS — R0602 Shortness of breath: Secondary | ICD-10-CM | POA: Diagnosis not present

## 2020-12-08 DIAGNOSIS — K5900 Constipation, unspecified: Secondary | ICD-10-CM | POA: Diagnosis present

## 2020-12-08 DIAGNOSIS — J9621 Acute and chronic respiratory failure with hypoxia: Secondary | ICD-10-CM

## 2020-12-08 DIAGNOSIS — Z8249 Family history of ischemic heart disease and other diseases of the circulatory system: Secondary | ICD-10-CM

## 2020-12-08 DIAGNOSIS — J9690 Respiratory failure, unspecified, unspecified whether with hypoxia or hypercapnia: Secondary | ICD-10-CM | POA: Diagnosis not present

## 2020-12-08 DIAGNOSIS — J439 Emphysema, unspecified: Secondary | ICD-10-CM | POA: Diagnosis not present

## 2020-12-08 DIAGNOSIS — G9341 Metabolic encephalopathy: Secondary | ICD-10-CM | POA: Diagnosis present

## 2020-12-08 DIAGNOSIS — I959 Hypotension, unspecified: Secondary | ICD-10-CM | POA: Diagnosis present

## 2020-12-08 DIAGNOSIS — Z930 Tracheostomy status: Secondary | ICD-10-CM

## 2020-12-08 DIAGNOSIS — J984 Other disorders of lung: Secondary | ICD-10-CM | POA: Diagnosis not present

## 2020-12-08 DIAGNOSIS — Z9981 Dependence on supplemental oxygen: Secondary | ICD-10-CM

## 2020-12-08 DIAGNOSIS — E669 Obesity, unspecified: Secondary | ICD-10-CM | POA: Diagnosis not present

## 2020-12-08 DIAGNOSIS — E875 Hyperkalemia: Secondary | ICD-10-CM

## 2020-12-08 DIAGNOSIS — I4510 Unspecified right bundle-branch block: Secondary | ICD-10-CM | POA: Diagnosis not present

## 2020-12-08 DIAGNOSIS — D75A Glucose-6-phosphate dehydrogenase (G6PD) deficiency without anemia: Secondary | ICD-10-CM | POA: Diagnosis present

## 2020-12-08 DIAGNOSIS — C9110 Chronic lymphocytic leukemia of B-cell type not having achieved remission: Secondary | ICD-10-CM

## 2020-12-08 DIAGNOSIS — J9602 Acute respiratory failure with hypercapnia: Secondary | ICD-10-CM | POA: Diagnosis present

## 2020-12-08 DIAGNOSIS — D638 Anemia in other chronic diseases classified elsewhere: Secondary | ICD-10-CM | POA: Diagnosis present

## 2020-12-08 DIAGNOSIS — R0902 Hypoxemia: Secondary | ICD-10-CM | POA: Diagnosis not present

## 2020-12-08 DIAGNOSIS — C9111 Chronic lymphocytic leukemia of B-cell type in remission: Secondary | ICD-10-CM | POA: Diagnosis not present

## 2020-12-08 DIAGNOSIS — R509 Fever, unspecified: Secondary | ICD-10-CM | POA: Diagnosis not present

## 2020-12-08 DIAGNOSIS — R0600 Dyspnea, unspecified: Secondary | ICD-10-CM | POA: Diagnosis not present

## 2020-12-08 DIAGNOSIS — Z87891 Personal history of nicotine dependence: Secondary | ICD-10-CM

## 2020-12-08 DIAGNOSIS — Z6838 Body mass index (BMI) 38.0-38.9, adult: Secondary | ICD-10-CM

## 2020-12-08 DIAGNOSIS — Z9289 Personal history of other medical treatment: Secondary | ICD-10-CM

## 2020-12-08 DIAGNOSIS — E876 Hypokalemia: Secondary | ICD-10-CM | POA: Diagnosis not present

## 2020-12-08 DIAGNOSIS — J9600 Acute respiratory failure, unspecified whether with hypoxia or hypercapnia: Secondary | ICD-10-CM | POA: Diagnosis not present

## 2020-12-08 DIAGNOSIS — J80 Acute respiratory distress syndrome: Secondary | ICD-10-CM | POA: Diagnosis not present

## 2020-12-08 DIAGNOSIS — R739 Hyperglycemia, unspecified: Secondary | ICD-10-CM | POA: Diagnosis present

## 2020-12-08 DIAGNOSIS — D72829 Elevated white blood cell count, unspecified: Secondary | ICD-10-CM

## 2020-12-08 DIAGNOSIS — T380X5A Adverse effect of glucocorticoids and synthetic analogues, initial encounter: Secondary | ICD-10-CM | POA: Diagnosis present

## 2020-12-08 DIAGNOSIS — J441 Chronic obstructive pulmonary disease with (acute) exacerbation: Secondary | ICD-10-CM

## 2020-12-08 DIAGNOSIS — J90 Pleural effusion, not elsewhere classified: Secondary | ICD-10-CM | POA: Diagnosis not present

## 2020-12-08 DIAGNOSIS — J159 Unspecified bacterial pneumonia: Secondary | ICD-10-CM | POA: Diagnosis present

## 2020-12-08 DIAGNOSIS — I4891 Unspecified atrial fibrillation: Secondary | ICD-10-CM | POA: Diagnosis not present

## 2020-12-08 DIAGNOSIS — Z79899 Other long term (current) drug therapy: Secondary | ICD-10-CM

## 2020-12-08 DIAGNOSIS — J449 Chronic obstructive pulmonary disease, unspecified: Secondary | ICD-10-CM | POA: Diagnosis not present

## 2020-12-08 DIAGNOSIS — Z9911 Dependence on respirator [ventilator] status: Secondary | ICD-10-CM | POA: Diagnosis not present

## 2020-12-08 DIAGNOSIS — J181 Lobar pneumonia, unspecified organism: Secondary | ICD-10-CM | POA: Diagnosis not present

## 2020-12-08 DIAGNOSIS — N179 Acute kidney failure, unspecified: Secondary | ICD-10-CM | POA: Insufficient documentation

## 2020-12-08 DIAGNOSIS — R918 Other nonspecific abnormal finding of lung field: Secondary | ICD-10-CM | POA: Diagnosis not present

## 2020-12-08 DIAGNOSIS — Z20822 Contact with and (suspected) exposure to covid-19: Secondary | ICD-10-CM | POA: Diagnosis present

## 2020-12-08 DIAGNOSIS — I10 Essential (primary) hypertension: Secondary | ICD-10-CM | POA: Diagnosis not present

## 2020-12-08 DIAGNOSIS — E871 Hypo-osmolality and hyponatremia: Secondary | ICD-10-CM | POA: Diagnosis present

## 2020-12-08 DIAGNOSIS — R5381 Other malaise: Secondary | ICD-10-CM | POA: Diagnosis not present

## 2020-12-08 DIAGNOSIS — J811 Chronic pulmonary edema: Secondary | ICD-10-CM | POA: Diagnosis not present

## 2020-12-08 DIAGNOSIS — C9190 Lymphoid leukemia, unspecified not having achieved remission: Secondary | ICD-10-CM | POA: Diagnosis not present

## 2020-12-08 DIAGNOSIS — Z87442 Personal history of urinary calculi: Secondary | ICD-10-CM

## 2020-12-08 DIAGNOSIS — Z823 Family history of stroke: Secondary | ICD-10-CM

## 2020-12-08 DIAGNOSIS — R600 Localized edema: Secondary | ICD-10-CM | POA: Diagnosis present

## 2020-12-08 DIAGNOSIS — E46 Unspecified protein-calorie malnutrition: Secondary | ICD-10-CM | POA: Diagnosis not present

## 2020-12-08 DIAGNOSIS — R000 Tachycardia, unspecified: Secondary | ICD-10-CM | POA: Diagnosis not present

## 2020-12-08 LAB — BASIC METABOLIC PANEL
Anion gap: 11 (ref 5–15)
Anion gap: 16 — ABNORMAL HIGH (ref 5–15)
Anion gap: 13 (ref 5–15)
BUN: 19 mg/dL (ref 8–23)
BUN: 25 mg/dL — ABNORMAL HIGH (ref 8–23)
BUN: 38 mg/dL — ABNORMAL HIGH (ref 8–23)
CO2: 33 mmol/L — ABNORMAL HIGH (ref 22–32)
CO2: 26 mmol/L (ref 22–32)
CO2: 28 mmol/L (ref 22–32)
Calcium: 8.6 mg/dL — ABNORMAL LOW (ref 8.9–10.3)
Calcium: 8.2 mg/dL — ABNORMAL LOW (ref 8.9–10.3)
Calcium: 8.2 mg/dL — ABNORMAL LOW (ref 8.9–10.3)
Chloride: 90 mmol/L — ABNORMAL LOW (ref 98–111)
Chloride: 91 mmol/L — ABNORMAL LOW (ref 98–111)
Chloride: 94 mmol/L — ABNORMAL LOW (ref 98–111)
Creatinine, Ser: 1.35 mg/dL — ABNORMAL HIGH (ref 0.61–1.24)
Creatinine, Ser: 1.46 mg/dL — ABNORMAL HIGH (ref 0.61–1.24)
Creatinine, Ser: 1.74 mg/dL — ABNORMAL HIGH (ref 0.61–1.24)
GFR, Estimated: 58 mL/min — ABNORMAL LOW (ref >60)
GFR, Estimated: 53 mL/min — ABNORMAL LOW (ref >60)
GFR, Estimated: 43 mL/min — ABNORMAL LOW (ref >60)
Glucose, Bld: 237 mg/dL — ABNORMAL HIGH (ref 70–99)
Glucose, Bld: 174 mg/dL — ABNORMAL HIGH (ref 70–99)
Glucose, Bld: 183 mg/dL — ABNORMAL HIGH (ref 70–99)
Potassium: 6 mmol/L — ABNORMAL HIGH (ref 3.5–5.1)
Potassium: 5.5 mmol/L — ABNORMAL HIGH (ref 3.5–5.1)
Potassium: 6.2 mmol/L — ABNORMAL HIGH (ref 3.5–5.1)
Sodium: 134 mmol/L — ABNORMAL LOW (ref 135–145)
Sodium: 133 mmol/L — ABNORMAL LOW (ref 135–145)
Sodium: 135 mmol/L (ref 135–145)

## 2020-12-08 LAB — CBC WITH DIFFERENTIAL/PLATELET
Abs Immature Granulocytes: 1.33 10*3/uL — ABNORMAL HIGH (ref 0.00–0.07)
Basophils Absolute: 3.6 10*3/uL — ABNORMAL HIGH (ref 0.0–0.1)
Basophils Relative: 1 %
Eosinophils Absolute: 0.3 10*3/uL (ref 0.0–0.5)
Eosinophils Relative: 0 %
HCT: 42.5 % (ref 39.0–52.0)
Hemoglobin: 11.5 g/dL — ABNORMAL LOW (ref 13.0–17.0)
Immature Granulocytes: 0 %
Lymphocytes Relative: 83 %
Lymphs Abs: 245.7 10*3/uL — ABNORMAL HIGH (ref 0.7–4.0)
MCH: 24.9 pg — ABNORMAL LOW (ref 26.0–34.0)
MCHC: 27.1 g/dL — ABNORMAL LOW (ref 30.0–36.0)
MCV: 92 fL (ref 80.0–100.0)
Monocytes Absolute: 39.3 10*3/uL — ABNORMAL HIGH (ref 0.1–1.0)
Monocytes Relative: 13 %
Neutro Abs: 8.9 10*3/uL — ABNORMAL HIGH (ref 1.7–7.7)
Neutrophils Relative %: 3 %
Platelets: 364 10*3/uL (ref 150–400)
RBC: 4.62 MIL/uL (ref 4.22–5.81)
RDW: 13.5 % (ref 11.5–15.5)
WBC: 299.1 10*3/uL (ref 4.0–10.5)
nRBC: 0 % (ref 0.0–0.2)

## 2020-12-08 LAB — I-STAT ARTERIAL BLOOD GAS, ED
Acid-Base Excess: 6 mmol/L — ABNORMAL HIGH (ref 0.0–2.0)
Bicarbonate: 35.7 mmol/L — ABNORMAL HIGH (ref 20.0–28.0)
Calcium, Ion: 1.18 mmol/L (ref 1.15–1.40)
HCT: 42 % (ref 39.0–52.0)
Hemoglobin: 14.3 g/dL (ref 13.0–17.0)
O2 Saturation: 92 %
Patient temperature: 97.1
Potassium: 4.3 mmol/L (ref 3.5–5.1)
Sodium: 131 mmol/L — ABNORMAL LOW (ref 135–145)
TCO2: 38 mmol/L — ABNORMAL HIGH (ref 22–32)
pCO2 arterial: 77.5 mmHg (ref 32.0–48.0)
pH, Arterial: 7.268 — ABNORMAL LOW (ref 7.350–7.450)
pO2, Arterial: 74 mmHg — ABNORMAL LOW (ref 83.0–108.0)

## 2020-12-08 LAB — ECHOCARDIOGRAM COMPLETE
Area-P 1/2: 5.04 cm2
Height: 70 in
S' Lateral: 2.9 cm
Single Plane A4C EF: 58.5 %
Weight: 4179.92 oz

## 2020-12-08 LAB — TRIGLYCERIDES: Triglycerides: 210 mg/dL — ABNORMAL HIGH (ref <150)

## 2020-12-08 LAB — BLOOD GAS, ARTERIAL
Acid-Base Excess: 4.4 mmol/L — ABNORMAL HIGH (ref 0.0–2.0)
Bicarbonate: 33 mmol/L — ABNORMAL HIGH (ref 20.0–28.0)
FIO2: 100
O2 Saturation: 98.5 %
Patient temperature: 37
pCO2 arterial: 101 mmHg (ref 32.0–48.0)
pH, Arterial: 7.14 — CL (ref 7.350–7.450)
pO2, Arterial: 190 mmHg — ABNORMAL HIGH (ref 83.0–108.0)

## 2020-12-08 LAB — URIC ACID: Uric Acid, Serum: 15.4 mg/dL — ABNORMAL HIGH (ref 3.7–8.6)

## 2020-12-08 LAB — HEMOGLOBIN A1C
Hgb A1c MFr Bld: 6.7 % — ABNORMAL HIGH (ref 4.8–5.6)
Mean Plasma Glucose: 145.59 mg/dL

## 2020-12-08 LAB — CBC
HCT: 37.4 % — ABNORMAL LOW (ref 39.0–52.0)
Hemoglobin: 10.9 g/dL — ABNORMAL LOW (ref 13.0–17.0)
MCH: 26.1 pg (ref 26.0–34.0)
MCHC: 29.1 g/dL — ABNORMAL LOW (ref 30.0–36.0)
MCV: 89.7 fL (ref 80.0–100.0)
Platelets: 305 10*3/uL (ref 150–400)
RBC: 4.17 MIL/uL — ABNORMAL LOW (ref 4.22–5.81)
RDW: 13.6 % (ref 11.5–15.5)
WBC: 172.3 10*3/uL (ref 4.0–10.5)
nRBC: 0 % (ref 0.0–0.2)

## 2020-12-08 LAB — MAGNESIUM
Magnesium: 3.2 mg/dL — ABNORMAL HIGH (ref 1.7–2.4)
Magnesium: 2.7 mg/dL — ABNORMAL HIGH (ref 1.7–2.4)
Magnesium: 2.8 mg/dL — ABNORMAL HIGH (ref 1.7–2.4)
Magnesium: 2.8 mg/dL — ABNORMAL HIGH (ref 1.7–2.4)

## 2020-12-08 LAB — GLUCOSE, CAPILLARY
Glucose-Capillary: 172 mg/dL — ABNORMAL HIGH (ref 70–99)
Glucose-Capillary: 150 mg/dL — ABNORMAL HIGH (ref 70–99)
Glucose-Capillary: 148 mg/dL — ABNORMAL HIGH (ref 70–99)
Glucose-Capillary: 132 mg/dL — ABNORMAL HIGH (ref 70–99)
Glucose-Capillary: 192 mg/dL — ABNORMAL HIGH (ref 70–99)

## 2020-12-08 LAB — TROPONIN I (HIGH SENSITIVITY)
Troponin I (High Sensitivity): 21 ng/L — ABNORMAL HIGH (ref <18)
Troponin I (High Sensitivity): 49 ng/L — ABNORMAL HIGH (ref <18)

## 2020-12-08 LAB — MRSA PCR SCREENING: MRSA by PCR: NEGATIVE

## 2020-12-08 LAB — SAVE SMEAR(SSMR), FOR PROVIDER SLIDE REVIEW

## 2020-12-08 LAB — RESP PANEL BY RT-PCR (FLU A&B, COVID) ARPGX2
Influenza A by PCR: NEGATIVE
Influenza B by PCR: NEGATIVE
SARS Coronavirus 2 by RT PCR: NEGATIVE

## 2020-12-08 LAB — PHOSPHORUS
Phosphorus: 7.7 mg/dL — ABNORMAL HIGH (ref 2.5–4.6)
Phosphorus: 6.3 mg/dL — ABNORMAL HIGH (ref 2.5–4.6)
Phosphorus: 6.6 mg/dL — ABNORMAL HIGH (ref 2.5–4.6)
Phosphorus: 6.7 mg/dL — ABNORMAL HIGH (ref 2.5–4.6)

## 2020-12-08 LAB — CBG MONITORING, ED: Glucose-Capillary: 175 mg/dL — ABNORMAL HIGH (ref 70–99)

## 2020-12-08 LAB — BRAIN NATRIURETIC PEPTIDE: B Natriuretic Peptide: 146.1 pg/mL — ABNORMAL HIGH (ref 0.0–100.0)

## 2020-12-08 LAB — HIV ANTIBODY (ROUTINE TESTING W REFLEX): HIV Screen 4th Generation wRfx: NONREACTIVE

## 2020-12-08 MED ORDER — FENTANYL CITRATE (PF) 100 MCG/2ML IJ SOLN
25.0000 ug | INTRAMUSCULAR | Status: DC | PRN
  Administered 2020-12-12 – 2020-12-13 (×3): 100 ug via INTRAVENOUS
  Administered 2020-12-15: 25 ug via INTRAVENOUS
  Administered 2020-12-16 – 2020-12-18 (×6): 100 ug via INTRAVENOUS
  Filled 2020-12-08 (×11): qty 2

## 2020-12-08 MED ORDER — SODIUM CHLORIDE 0.9 % IV SOLN
250.0000 mL | INTRAVENOUS | Status: DC
  Administered 2020-12-09: 250 mL via INTRAVENOUS

## 2020-12-08 MED ORDER — PROSOURCE TF PO LIQD
45.0000 mL | Freq: Four times a day (QID) | ORAL | Status: DC
  Administered 2020-12-08 – 2020-12-15 (×26): 45 mL
  Filled 2020-12-08 (×25): qty 45

## 2020-12-08 MED ORDER — PANTOPRAZOLE SODIUM 40 MG PO PACK
40.0000 mg | PACK | Freq: Every day | ORAL | Status: DC
  Administered 2020-12-08 – 2020-12-10 (×3): 40 mg
  Filled 2020-12-08 (×4): qty 20

## 2020-12-08 MED ORDER — DEXTROSE 50 % IV SOLN
1.0000 | Freq: Once | INTRAVENOUS | Status: AC
  Administered 2020-12-08: 50 mL via INTRAVENOUS
  Filled 2020-12-08: qty 50

## 2020-12-08 MED ORDER — POLYETHYLENE GLYCOL 3350 17 G PO PACK
17.0000 g | PACK | Freq: Every day | ORAL | Status: DC | PRN
Start: 2020-12-08 — End: 2020-12-19
  Administered 2020-12-10: 17 g

## 2020-12-08 MED ORDER — INSULIN ASPART 100 UNIT/ML ~~LOC~~ SOLN
0.0000 [IU] | SUBCUTANEOUS | Status: DC
  Administered 2020-12-08: 2 [IU] via SUBCUTANEOUS
  Administered 2020-12-08: 3 [IU] via SUBCUTANEOUS
  Administered 2020-12-08: 2 [IU] via SUBCUTANEOUS
  Administered 2020-12-08: 3 [IU] via SUBCUTANEOUS
  Administered 2020-12-08: 2 [IU] via SUBCUTANEOUS
  Administered 2020-12-09 – 2020-12-10 (×7): 3 [IU] via SUBCUTANEOUS
  Administered 2020-12-10 (×2): 5 [IU] via SUBCUTANEOUS

## 2020-12-08 MED ORDER — FENTANYL 2500MCG IN NS 250ML (10MCG/ML) PREMIX INFUSION
0.0000 ug/h | INTRAVENOUS | Status: DC
  Administered 2020-12-08: 25 ug/h via INTRAVENOUS
  Administered 2020-12-09: 175 ug/h via INTRAVENOUS
  Administered 2020-12-09: 150 ug/h via INTRAVENOUS
  Administered 2020-12-10 (×2): 175 ug/h via INTRAVENOUS
  Administered 2020-12-11: 25 ug/h via INTRAVENOUS
  Administered 2020-12-13: 75 ug/h via INTRAVENOUS
  Administered 2020-12-14: 50 ug/h via INTRAVENOUS
  Filled 2020-12-08 (×7): qty 250

## 2020-12-08 MED ORDER — INSULIN ASPART 100 UNIT/ML IV SOLN
5.0000 [IU] | Freq: Once | INTRAVENOUS | Status: AC
  Administered 2020-12-08: 5 [IU] via INTRAVENOUS

## 2020-12-08 MED ORDER — PROPOFOL 10 MG/ML IV BOLUS: 0.5000 mg/kg | Freq: Once | INTRAVENOUS | Status: DC

## 2020-12-08 MED ORDER — PROSOURCE TF PO LIQD
45.0000 mL | Freq: Two times a day (BID) | ORAL | Status: DC
  Administered 2020-12-08: 45 mL
  Filled 2020-12-08: qty 45

## 2020-12-08 MED ORDER — PANTOPRAZOLE SODIUM 40 MG IV SOLR
40.0000 mg | Freq: Every day | INTRAVENOUS | Status: DC
  Administered 2020-12-08: 40 mg via INTRAVENOUS
  Filled 2020-12-08: qty 40

## 2020-12-08 MED ORDER — SODIUM ZIRCONIUM CYCLOSILICATE 5 G PO PACK
10.0000 g | PACK | Freq: Once | ORAL | Status: AC
  Administered 2020-12-08: 10 g
  Filled 2020-12-08: qty 2

## 2020-12-08 MED ORDER — AZITHROMYCIN 500 MG IV SOLR
500.0000 mg | INTRAVENOUS | Status: AC
  Administered 2020-12-08 – 2020-12-12 (×5): 500 mg via INTRAVENOUS
  Filled 2020-12-08 (×5): qty 500

## 2020-12-08 MED ORDER — ENOXAPARIN SODIUM 40 MG/0.4ML ~~LOC~~ SOLN
40.0000 mg | SUBCUTANEOUS | Status: DC
  Administered 2020-12-08 – 2020-12-09 (×2): 40 mg via SUBCUTANEOUS
  Filled 2020-12-08 (×3): qty 0.4

## 2020-12-08 MED ORDER — PHENYLEPHRINE HCL-NACL 10-0.9 MG/250ML-% IV SOLN
INTRAVENOUS | Status: AC
  Filled 2020-12-08: qty 250

## 2020-12-08 MED ORDER — METHYLPREDNISOLONE SODIUM SUCC 40 MG IJ SOLR
40.0000 mg | Freq: Two times a day (BID) | INTRAMUSCULAR | Status: DC
  Administered 2020-12-08 – 2020-12-09 (×5): 40 mg via INTRAVENOUS
  Filled 2020-12-08 (×6): qty 1

## 2020-12-08 MED ORDER — DOCUSATE SODIUM 50 MG/5ML PO LIQD
100.0000 mg | Freq: Two times a day (BID) | ORAL | Status: DC | PRN
  Administered 2020-12-10 – 2020-12-18 (×2): 100 mg
  Filled 2020-12-08 (×2): qty 10

## 2020-12-08 MED ORDER — PHENYLEPHRINE HCL-NACL 10-0.9 MG/250ML-% IV SOLN
25.0000 ug/min | INTRAVENOUS | Status: DC
  Administered 2020-12-08: 25 ug/min via INTRAVENOUS
  Administered 2020-12-08: 75 ug/min via INTRAVENOUS
  Administered 2020-12-08: 35 ug/min via INTRAVENOUS
  Administered 2020-12-08: 75 ug/min via INTRAVENOUS
  Administered 2020-12-09: 10 ug/min via INTRAVENOUS
  Administered 2020-12-09: 35 ug/min via INTRAVENOUS
  Administered 2020-12-10 – 2020-12-11 (×2): 25 ug/min via INTRAVENOUS
  Filled 2020-12-08 (×7): qty 250

## 2020-12-08 MED ORDER — ALBUTEROL SULFATE (2.5 MG/3ML) 0.083% IN NEBU
5.0000 mg | INHALATION_SOLUTION | Freq: Once | RESPIRATORY_TRACT | Status: DC
  Filled 2020-12-08: qty 6

## 2020-12-08 MED ORDER — PROPOFOL 1000 MG/100ML IV EMUL
5.0000 ug/kg/min | INTRAVENOUS | Status: DC
  Administered 2020-12-08: 5 ug/kg/min via INTRAVENOUS

## 2020-12-08 MED ORDER — VITAL HIGH PROTEIN PO LIQD
1000.0000 mL | ORAL | Status: DC
Start: 2020-12-08 — End: 2020-12-15
  Administered 2020-12-08 – 2020-12-15 (×8): 1000 mL

## 2020-12-08 MED ORDER — SODIUM ZIRCONIUM CYCLOSILICATE 10 G PO PACK: 10.0000 g | PACK | Freq: Two times a day (BID) | ORAL | Status: DC

## 2020-12-08 MED ORDER — SODIUM CHLORIDE 0.9 % IV SOLN
6.0000 mg | Freq: Once | INTRAVENOUS | Status: AC
  Administered 2020-12-08: 6 mg via INTRAVENOUS
  Filled 2020-12-08: qty 4

## 2020-12-08 MED ORDER — VITAL HIGH PROTEIN PO LIQD
1000.0000 mL | ORAL | Status: DC
  Filled 2020-12-08: qty 1000

## 2020-12-08 MED ORDER — SODIUM ZIRCONIUM CYCLOSILICATE 5 G PO PACK
10.0000 g | PACK | Freq: Three times a day (TID) | ORAL | Status: DC
  Administered 2020-12-08 – 2020-12-12 (×10): 10 g
  Filled 2020-12-08 (×9): qty 2

## 2020-12-08 MED ORDER — PROPOFOL 1000 MG/100ML IV EMUL
5.0000 ug/kg/min | INTRAVENOUS | Status: DC
  Administered 2020-12-08 (×3): 20 ug/kg/min via INTRAVENOUS
  Administered 2020-12-09 (×3): 40 ug/kg/min via INTRAVENOUS
  Administered 2020-12-09: 30 ug/kg/min via INTRAVENOUS
  Administered 2020-12-09: 40 ug/kg/min via INTRAVENOUS
  Administered 2020-12-09: 30 ug/kg/min via INTRAVENOUS
  Administered 2020-12-10: 40 ug/kg/min via INTRAVENOUS
  Administered 2020-12-10: 35 ug/kg/min via INTRAVENOUS
  Administered 2020-12-10 (×2): 40 ug/kg/min via INTRAVENOUS
  Filled 2020-12-08 (×14): qty 100

## 2020-12-08 MED ORDER — SODIUM CHLORIDE 0.9 % IV SOLN
2.0000 g | INTRAVENOUS | Status: AC
  Administered 2020-12-08 – 2020-12-12 (×5): 2 g via INTRAVENOUS
  Filled 2020-12-08 (×5): qty 20

## 2020-12-08 MED ORDER — FENTANYL CITRATE (PF) 100 MCG/2ML IJ SOLN: 25.0000 ug | INTRAMUSCULAR | Status: DC | PRN

## 2020-12-08 MED ORDER — CHLORHEXIDINE GLUCONATE CLOTH 2 % EX PADS
6.0000 | MEDICATED_PAD | Freq: Every day | CUTANEOUS | Status: DC
  Administered 2020-12-08 – 2020-12-18 (×9): 6 via TOPICAL

## 2020-12-08 MED ORDER — IPRATROPIUM-ALBUTEROL 0.5-2.5 (3) MG/3ML IN SOLN
3.0000 mL | Freq: Four times a day (QID) | RESPIRATORY_TRACT | Status: DC
  Administered 2020-12-08 – 2020-12-13 (×22): 3 mL via RESPIRATORY_TRACT
  Filled 2020-12-08 (×23): qty 3

## 2020-12-08 MED ORDER — ROCURONIUM BROMIDE 50 MG/5ML IV SOLN
INTRAVENOUS | Status: AC | PRN
  Administered 2020-12-08: 100 mg via INTRAVENOUS

## 2020-12-08 MED ORDER — CHLORHEXIDINE GLUCONATE 0.12% ORAL RINSE (MEDLINE KIT)
15.0000 mL | Freq: Two times a day (BID) | OROMUCOSAL | Status: DC
  Administered 2020-12-08 – 2020-12-19 (×22): 15 mL via OROMUCOSAL

## 2020-12-08 MED ORDER — SODIUM CHLORIDE 0.9 % IV SOLN: INTRAVENOUS | Status: AC

## 2020-12-08 MED ORDER — ORAL CARE MOUTH RINSE
15.0000 mL | OROMUCOSAL | Status: DC
  Administered 2020-12-08 – 2020-12-16 (×80): 15 mL via OROMUCOSAL

## 2020-12-08 MED ORDER — ETOMIDATE 2 MG/ML IV SOLN
INTRAVENOUS | Status: AC | PRN
  Administered 2020-12-08: 30 mg via INTRAVENOUS

## 2020-12-08 MED ORDER — FUROSEMIDE 10 MG/ML IJ SOLN
40.0000 mg | Freq: Once | INTRAMUSCULAR | Status: AC
  Administered 2020-12-08: 40 mg via INTRAVENOUS
  Filled 2020-12-08: qty 4

## 2020-12-08 NOTE — Progress Notes (Signed)
[facility] Progress Note Patient Name: Isaac [name] DOB: 1956-01-04 MRN: [id number]   Date of Service  12/08/2020  HPI/Events of Note  Nursing report soft BP on sedation with Propofol IV infusion. BP = 102/73 with MAP = 84.   eICU Interventions  Plan: 1. D/C Propofol IV infusion. 2. Fentanyl IV infusion. Titrate to RASS = 0 to -1.     Intervention Category Major Interventions: Hypotension - evaluation and management  Lysle Dingwall 12/08/2020, 4:51 AM

## 2020-12-08 NOTE — Progress Notes (Signed)
[facility] Progress Note Patient Name: Isaac [name] DOB: [birth year] MRN: [id number]   Date of Service  12/08/2020  HPI/Events of Note  Hypotension - BP = 68/51 with MAP = 58. No CVL or CVP available.   eICU Interventions  Plan: 1. Phenylephrine IV infusion via PIV. Titrate to MAP >= 65.      Intervention Category Major Interventions: Hypotension - evaluation and management  Lysle Dingwall 12/08/2020, 5:32 AM

## 2020-12-08 NOTE — Progress Notes (Signed)
[facility] Progress Note Patient Name: Isaac [name] DOB: 04/12/1956 MRN: [id number]   Date of Service  12/08/2020  HPI/Events of Note  ABG on 40%/PRVC 24/TV 580/P 5 = 7.26/77.5/74/35  eICU Interventions  Plan: 1. Increase PRVC rate to 28. 2. Repeat ABG at 4 AM.      Intervention Category Major Interventions: Acid-Base disturbance - evaluation and management;Respiratory failure - evaluation and management  Lysle Dingwall 12/08/2020, 2:47 AM

## 2020-12-08 NOTE — H&P (Addendum)
NAME:  Isaac [name], MRN:  [id number], DOB:  09-23-1956, LOS: 0 ADMISSION DATE:  12/08/2020, CONSULTATION DATE:  12/08/20 REFERRING MD:  EDP, CHIEF COMPLAINT:  Shortness of breath   Brief History:  65 y.o. y/o M with PMH of COPD on 4L home O2, HTN, HL, prior tobacco use who was brought to the ED by EMS for shortness of breath.  Just prior to arriving to the ED, pt became unresponsive and was intubated on arrival.  ABG with severe Hypercapnic respiratory failure and CXR with possible infiltrates  History of Present Illness:  Isaac [name] is a 65 y.o. M with PMH of  COPD on 4L home O2 followed by Dr. Chase Caller, HTN, HL, prior tobacco use who called EMS for worsening shortness of breath.  En route to the ED, he became unresponsive and required intubation on arrival.   Further history obtained from patient's wife, she states that he has been compliant with wearing home O2, does not wear CPAP or Bipap and that his dyspnea started with mild cough 2-3 days ago.  No known fever, but felt warm.  He is vaccinated, but not boosted for Covid-19.  In the ED, workup was significant for hypercapnic respiratory failure with ABG 7.14/101/190/33,  WBC 299k, potassium 6.0 and possibly patchy infiltrates on CXR.  Covid-19 and influenza are negative.  He has had an elevated WBC in the past and was referred to Oncology, he was not able to make his last appointment and has not scheduled a follow up as he was feeling ok.  PCCM consulted for admission  Past Medical History:   has a past medical history of Anxiety, COPD (chronic obstructive pulmonary disease) ([place]), GERD (gastroesophageal reflux disease), Hematuria, Hyperlipidemia, Hypertension, Insomnia, Nephrolithiasis, and Tobacco dependence.   Significant Hospital Events:  1/13 Intubated in ED, PCCM admit  Consults:    Procedures:  1/13 ETT  Significant Diagnostic Tests:  1/13 CXR>>Diffuse interstitial opacities throughout both lungs with some additional  patchy areas of peripheral consolidation. Findings could reflect atypical infection (including COVID 19) and/or developing pulmonary edema.   Micro Data:  1/13 Covid-19, Flu>> 1/13 UC>>  Antimicrobials:  Ceftriaxone 1/13- Azithromycin 1/13-  Interim History / Subjective:    Objective   Blood pressure 106/82, pulse (!) 119, temperature (!) 97.4 F (36.3 C), resp. rate (!) 24, height 5\' 10"  (1.778 m), weight 125 kg, SpO2 96 %.    Vent Mode: PRVC FiO2 (%):  [100 %] 100 % Set Rate:  [14 bmp] 14 bmp Vt Set:  [580 mL] 580 mL PEEP:  [5 cmH20] 5 cmH20 Plateau Pressure:  [20 cmH20] 20 cmH20   Intake/Output Summary (Last 24 hours) at 12/08/2020 0144 Last data filed at 12/08/2020 0112 Gross per 24 hour  Intake --  Output 40 ml  Net -40 ml   Filed Weights   12/08/20 0043  Weight: 125 kg    General:  Obese M, intubated and sedated HEENT: MM pink/moist, ETT in place, pupils equal and responsive to light Neuro: examined on propofol, not responsive, +gag, triggering breaths on vent, PERRLA CV: s1s2 rrr, no m/r/g PULM:  Course bilaterally, on full vent support PRVC PEEP 5, 40% with elevated peak pressures GI: soft, bsx4 active  Extremities: warm/dry, 1+ edema  Skin: no rashes or lesions  Resolved Hospital Problem list     Assessment & Plan:   Acute on chronic Hypercapnic Respiratory Failure Pt with severe COPD on home O2, uses albuterol, ipratropium, Singulair and Pulmicort at home, possibly  exacerbated by developing CAP/pulmonary edema P: --Maintain full vent support with SAT/SBT as tolerated -titrate Vent setting to maintain SpO2 greater than or equal to 90%. -HOB elevated 30 degrees. -Plateau pressures less than 30 cm H20.  -Follow chest x-ray, ABG prn.   -Bronchial hygiene and RT/bronchodilator protocol.  -sedation with propofol, prn Fentanyl -Start Azithromycin and Ceftriaxone -Solumedrol -scheduled nebulizers -received Lasix 40mg  in the ED, monitor  UOP -Echocardiogram   Severe Leukocytosis, concern for CLL or myeloproliferative disorder Appears on chart review that patient had an appointment with Oncology that was cancelled in 06/2020 P: -CBC 299k on admission -peripheral smear -Will need Oncology consult, possibly plasmapheresis to reduce hyperviscosity -  FISH CLL and BCR ABL 1 FISH were ordered outpatient, but not obatined  AKI, Hyperkalemia Consider tumor lysis syndrome in the setting of possible oncologic diagnosis P: -received 5 units insulin and D50 in the ED, repeat K 4.3 -Mag and phos are pending -Closely follow renal indices and electrolytes and avoid nephrotoxins as able   HTN, HL -hold home Bisprolol, Losartan, HCTZ, Diovan, Clonidine in the setting of the above, resume beta blocker first if hypertensive    Best practice (evaluated daily)  Diet: NPO Pain/Anxiety/Delirium protocol (if indicated): Propofol, Fentanyl VAP protocol (if indicated): HOB 30 degrees, suction prn DVT prophylaxis: lovenox GI prophylaxis: protonix Glucose control: SSI Mobility: bed rest Disposition:ICU Family Communication: Pt's wife updated by phone 1/13  Goals of Care:  Last date of multidisciplinary goals of care discussion: Family and staff present:  Summary of discussion:  Follow up goals of care discussion due: 1/20 Code Status: Full code  Labs   CBC: Recent Labs  Lab 12/08/20 0100  WBC 299.1*  NEUTROABS 8.9*  HGB 11.5*  HCT 42.5  MCV 92.0  PLT 536    Basic Metabolic Panel: Recent Labs  Lab 12/08/20 0100  NA 134*  K 6.0*  CL 90*  CO2 33*  GLUCOSE 237*  BUN 19  CREATININE 1.35*  CALCIUM 8.6*   GFR: Estimated Creatinine Clearance: 72.4 mL/min (A) (by C-G formula based on SCr of 1.35 mg/dL (H)). Recent Labs  Lab 12/08/20 0100  WBC 299.1*    Liver Function Tests: No results for input(s): AST, ALT, ALKPHOS, BILITOT, PROT, ALBUMIN in the last 168 hours. No results for input(s): LIPASE, AMYLASE in the  last 168 hours. No results for input(s): AMMONIA in the last 168 hours.  ABG    Component Value Date/Time   PHART 7.140 (LL) 12/08/2020 0120   PCO2ART 101 (HH) 12/08/2020 0120   PO2ART 190 (H) 12/08/2020 0120   HCO3 33.0 (H) 12/08/2020 0120   TCO2 40.8 04/30/2014 0555   O2SAT 98.5 12/08/2020 0120     Coagulation Profile: No results for input(s): INR, PROTIME in the last 168 hours.  Cardiac Enzymes: No results for input(s): CKTOTAL, CKMB, CKMBINDEX, TROPONINI in the last 168 hours.  HbA1C: No results found for: HGBA1C  CBG: No results for input(s): GLUCAP in the last 168 hours.  Review of Systems:   Unable to obtain secondary to intubated  Past Medical History:  He,  has a past medical history of Anxiety, COPD (chronic obstructive pulmonary disease) ([place]), GERD (gastroesophageal reflux disease), Hematuria, Hyperlipidemia, Hypertension, Insomnia, Nephrolithiasis, and Tobacco dependence.   Surgical History:   Past Surgical History:  Procedure Laterality Date  . LITHOTRIPSY       Social History:   reports that he quit smoking about 12 years ago. His smoking use included cigarettes. He has a 35.00  pack-year smoking history. He has never used smokeless tobacco.   Family History:  His family history includes Heart disease in his father; Leukemia in his brother; Stroke in his mother.   Allergies Allergies  Allergen Reactions  . Codeine Itching    "jittery"  . Prozac [Fluoxetine Hcl]     confusion     Home Medications  Prior to Admission medications   Medication Sig Start Date End Date Taking? Authorizing Provider  albuterol (PROVENTIL) (2.5 MG/3ML) 0.083% nebulizer solution USE 1 VIAL IN NEBULIZER AS NEEDED FOR WHEEZING/SHORTNESS OF BREATH 10/19/20   Brand Males, MD  albuterol (VENTOLIN HFA) 108 (90 Base) MCG/ACT inhaler Inhale 2 puffs into the lungs every 4 (four) hours as needed for wheezing or shortness of breath. 12/08/19   Brand Males, MD  bisoprolol  (ZEBETA) 5 MG tablet Take 5 mg by mouth daily. Two times daily    [provider]  budesonide (PULMICORT) 0.5 MG/2ML nebulizer solution TAKE 2 ML BY NEBULIZATION 2 TIMES DAILY. 06/17/20   Brand Males, MD  Cinnamon 500 MG capsule Take 500 mg by mouth daily.    [provider]  cloNIDine (CATAPRES) 0.2 MG tablet Take 0.2 mg by mouth 3 (three) times daily.    [provider]  fluticasone (FLONASE) 50 MCG/ACT nasal spray SPRAY 2 SPRAYS INTO EACH NOSTRIL EVERY DAY 10/19/20   Brand Males, MD  ipratropium (ATROVENT) 0.02 % nebulizer solution INHALE 1 VIAL IN NEBULIZER 4 TIMES A DAY 11/09/20   Brand Males, MD  losartan-hydrochlorothiazide (HYZAAR) 50-12.5 MG per tablet Take 1 tablet by mouth daily.    [provider]  montelukast (SINGULAIR) 10 MG tablet Take 10 mg by mouth at bedtime.    [provider]  nystatin (MYCOSTATIN) 100000 UNIT/ML suspension Take 5 mLs (500,000 Units total) by mouth 4 (four) times daily. 01/07/18   Brand Males, MD  nystatin (MYCOSTATIN) 100000 UNIT/ML suspension Take 5 mLs (500,000 Units total) by mouth 4 (four) times daily. 02/03/19   Brand Males, MD  omeprazole (PRILOSEC) 20 MG capsule Take 2 capsules (40 mg total) by mouth daily. For GERD. 07/09/12   Erick Colace, NP  sodium chloride (OCEAN) 0.65 % SOLN nasal spray Place 2 sprays into both nostrils as needed for congestion. Patient taking differently: Place 2 sprays into both nostrils daily as needed for congestion.  05/05/14   Erick Colace, NP  valsartan-hydrochlorothiazide (DIOVAN-HCT) 160-12.5 MG tablet 1 TABLET ONCE A DAY (REPLACES LOSARTAN/HYDROCHLOROTHIAZIDE) ORALLY 90 05/13/19   [provider]     Critical care time: 45 minutes       CRITICAL CARE Performed by: Otilio Carpen Aleene Swanner   Total critical care time: 45 minutes  Critical care time was exclusive of separately billable procedures and treating other patients.  Critical  care was necessary to treat or prevent imminent or life-threatening deterioration.  Critical care was time spent personally by me on the following activities: development of treatment plan with patient and/or surrogate as well as nursing, discussions with consultants, evaluation of patient's response to treatment, examination of patient, obtaining history from patient or surrogate, ordering and performing treatments and interventions, ordering and review of laboratory studies, ordering and review of radiographic studies, pulse oximetry and re-evaluation of patient's condition.   Otilio Carpen Shanikqua Zarzycki, PA-C [facility]  See amion for pager details

## 2020-12-08 NOTE — ED Provider Notes (Signed)
[facility] EMERGENCY DEPARTMENT Provider Note   CSN: [id number] Arrival date & time: 12/08/20  0029     History Chief Complaint  Patient presents with  . Shortness of Breath   Level 5 caveat due to acuity of condition Isaac [name] is a 65 y.o. male.  The history is provided by the EMS personnel. The history is limited by the condition of the patient.  Shortness of Breath Severity:  Severe Onset quality:  Sudden Timing:  Constant Progression:  Worsening Chronicity:  New Relieved by:  Nothing Worsened by:  Nothing Patient with history of COPD, obesity presents in respiratory distress. EMS reports they were called out for shortness of breath  the patient was awake and alert and was refusing to be transported.  After he received medications he agreed to go to the ER.  Just prior to arrival patient became unresponsive.     Past Medical History:  Diagnosis Date  . Anxiety   . COPD (chronic obstructive pulmonary disease) ([place])   . GERD (gastroesophageal reflux disease)   . Hematuria   . Hyperlipidemia   . Hypertension   . Insomnia   . Nephrolithiasis   . Tobacco dependence     Patient Active Problem List   Diagnosis Date Noted  . Healthcare maintenance 08/12/2020  . Oral thrush 04/30/2017  . Dysfunction of right eustachian tube 01/19/2016  . Left ear impacted cerumen 01/19/2016  . COPD, very severe ([place]) 08/22/2015  . Chronic respiratory failure with hypercapnia ([place]) 06/08/2014  . Unspecified sleep apnea 06/08/2014  . Chronic diastolic heart failure ([place]) 05/11/2014  . Acute-on-chronic respiratory failure ([place]) 04/24/2014  . Sinusitis 04/13/2014  . Renal insufficiency 07/07/2012  . Hypertension 07/07/2012  . COPD exacerbation ([place]) 07/04/2012  . COPD, severe ([place]) 04/25/2012  . Sleep apnea 04/25/2012  . Obesity 04/25/2012    Past Surgical History:  Procedure Laterality Date  . LITHOTRIPSY         Family History  Problem Relation  Age of Onset  . Heart disease Father   . Stroke Mother   . Leukemia Brother     Social History   Tobacco Use  . Smoking status: Former Smoker    Packs/day: 1.00    Years: 35.00    Pack years: 35.00    Types: Cigarettes    Quit date: 12/07/2008    Years since quitting: 12.0  . Smokeless tobacco: Never Used  Vaping Use  . Vaping Use: Never used    Home Medications Prior to Admission medications   Medication Sig Start Date End Date Taking? Authorizing Provider  albuterol (PROVENTIL) (2.5 MG/3ML) 0.083% nebulizer solution USE 1 VIAL IN NEBULIZER AS NEEDED FOR WHEEZING/SHORTNESS OF BREATH 10/19/20   Brand Males, MD  albuterol (VENTOLIN HFA) 108 (90 Base) MCG/ACT inhaler Inhale 2 puffs into the lungs every 4 (four) hours as needed for wheezing or shortness of breath. 12/08/19   Brand Males, MD  bisoprolol (ZEBETA) 5 MG tablet Take 5 mg by mouth daily. Two times daily    [provider]  budesonide (PULMICORT) 0.5 MG/2ML nebulizer solution TAKE 2 ML BY NEBULIZATION 2 TIMES DAILY. 06/17/20   Brand Males, MD  Cinnamon 500 MG capsule Take 500 mg by mouth daily.    [provider]  cloNIDine (CATAPRES) 0.2 MG tablet Take 0.2 mg by mouth 3 (three) times daily.    [provider]  fluticasone (FLONASE) 50 MCG/ACT nasal spray SPRAY 2 SPRAYS INTO EACH NOSTRIL EVERY DAY 10/19/20  Brand Males, MD  ipratropium (ATROVENT) 0.02 % nebulizer solution INHALE 1 VIAL IN NEBULIZER 4 TIMES A DAY 11/09/20   Brand Males, MD  losartan-hydrochlorothiazide (HYZAAR) 50-12.5 MG per tablet Take 1 tablet by mouth daily.    [provider]  montelukast (SINGULAIR) 10 MG tablet Take 10 mg by mouth at bedtime.    [provider]  nystatin (MYCOSTATIN) 100000 UNIT/ML suspension Take 5 mLs (500,000 Units total) by mouth 4 (four) times daily. 01/07/18   Brand Males, MD  nystatin (MYCOSTATIN) 100000 UNIT/ML suspension Take 5 mLs (500,000 Units  total) by mouth 4 (four) times daily. 02/03/19   Brand Males, MD  omeprazole (PRILOSEC) 20 MG capsule Take 2 capsules (40 mg total) by mouth daily. For GERD. 07/09/12   Erick Colace, NP  sodium chloride (OCEAN) 0.65 % SOLN nasal spray Place 2 sprays into both nostrils as needed for congestion. Patient taking differently: Place 2 sprays into both nostrils daily as needed for congestion.  05/05/14   Erick Colace, NP  valsartan-hydrochlorothiazide (DIOVAN-HCT) 160-12.5 MG tablet 1 TABLET ONCE A DAY (REPLACES LOSARTAN/HYDROCHLOROTHIAZIDE) ORALLY 90 05/13/19   [provider]    Allergies    Codeine and Prozac [fluoxetine hcl]  Review of Systems   Review of Systems  Unable to perform ROS: Acuity of condition  Respiratory: Positive for shortness of breath.     Physical Exam Updated Vital Signs BP (!) 113/95   Pulse (!) 117   Resp 18   Ht 1.803 m (5\' 11" )   Wt 125 kg   SpO2 (!) 88%   BMI 38.43 kg/m   Physical Exam CONSTITUTIONAL: Ill-appearing HEAD: Normocephalic/atraumatic EYES: EOMI/PERRL ENMT: CPAP mask in place NECK: supple no meningeal signs CV: Tachycardic, distant heart sounds LUNGS: Respiratory distress noted, decreased breath sounds bilaterally ABDOMEN: soft, obese GU: Patient is incontinent NEURO: Pt is unresponsive, GCS 3 EXTREMITIES: pulses normal/equal, no deformities SKIN: warm, color normal PSYCH: Unable to assess  ED Results / Procedures / Treatments   Labs (all labs ordered are listed, but only abnormal results are displayed) Labs Reviewed  BASIC METABOLIC PANEL - Abnormal; Notable for the following components:      Result Value   Sodium 134 (*)    Potassium 6.0 (*)    Chloride 90 (*)    CO2 33 (*)    Glucose, Bld 237 (*)    Creatinine, Ser 1.35 (*)    Calcium 8.6 (*)    GFR, Estimated 58 (*)    All other components within normal limits  CBC WITH DIFFERENTIAL/PLATELET - Abnormal; Notable for the following components:   WBC 299.1  (*)    Hemoglobin 11.5 (*)    MCH 24.9 (*)    MCHC 27.1 (*)    Neutro Abs 8.9 (*)    Lymphs Abs 245.7 (*)    Monocytes Absolute 39.3 (*)    Basophils Absolute 3.6 (*)    Abs Immature Granulocytes 1.33 (*)    All other components within normal limits  BLOOD GAS, ARTERIAL - Abnormal; Notable for the following components:   pH, Arterial 7.140 (*)    pCO2 arterial 101 (*)    pO2, Arterial 190 (*)    Bicarbonate 33.0 (*)    Acid-Base Excess 4.4 (*)    All other components within normal limits  RESP PANEL BY RT-PCR (FLU A&B, COVID) ARPGX2  URINE CULTURE  BRAIN NATRIURETIC PEPTIDE  PATHOLOGIST SMEAR REVIEW  HIV ANTIBODY (ROUTINE TESTING W REFLEX)  CBC  BASIC  METABOLIC PANEL  BLOOD GAS, ARTERIAL  MAGNESIUM  PHOSPHORUS  MAGNESIUM  PHOSPHORUS    EKG EKG Interpretation  Date/Time:  Thursday December 08 2020 00:40:02 EST Ventricular Rate:  151 PR Interval:    QRS Duration: 158 QT Interval:  335 QTC Calculation: 531 R Axis:   138 Text Interpretation: indeterminate, suspect sinus tachycardia RBBB and LPFB Interpretation limited secondary to artifact Abnormal ekg Confirmed by Ripley Fraise [phone]) on 12/08/2020 12:51:53 AM   EKG Interpretation  Date/Time:  Thursday December 08 2020 01:01:17 EST Ventricular Rate:  111 PR Interval:    QRS Duration: 162 QT Interval:  360 QTC Calculation: 490 R Axis:   124 Text Interpretation: Sinus tachycardia RBBB and LPFB Confirmed by Ripley Fraise [phone]) on 12/08/2020 1:17:09 AM        Radiology DG Chest Portable 1 View  Result Date: 12/08/2020 CLINICAL DATA:  Dyspnea, intubation EXAM: PORTABLE CHEST - 1 VIEW; PORTABLE ABDOMEN - 1 VIEW COMPARISON:  Radiograph 12/17/2016, 07/15/2008, CT 05/05/2008 FINDINGS: *Endotracheal tube tip terminates in the mid trachea, 5.4 cm from the carina. *A transesophageal tube tip and side port distal to the GE junction, terminating at the level of the distal gastric body. *Telemetry leads and external  support devices overlie the chest. Diffuse interstitial opacities throughout both lungs with some additional patchy areas of peripheral consolidation. Pulmonary vascular congestion with fissural and septal thickening is noted as well. No pneumothorax or effusion. Prominence of the cardiac silhouette though may be related to portable technique. No worrisome bowel gas pattern is seen in the included portions of the abdomen. No subdiaphragmatic free air. No suspicious abdominal calcifications. Degenerative changes are present in the spine and shoulders. IMPRESSION: 1. Endotracheal tube tip terminates in the mid trachea, 5.4 cm from the carina. 2. A transesophageal tube tip and side port distal to the GE junction terminating at the distal gastric body. 3. Diffuse interstitial opacities throughout both lungs with some additional patchy areas of peripheral consolidation. Findings could reflect atypical infection (including COVID 19) and/or developing pulmonary edema. Electronically Signed   By: Lovena Le M.D.   On: 12/08/2020 01:04   DG Abd Portable 1 View  Result Date: 12/08/2020 CLINICAL DATA:  Dyspnea, intubation EXAM: PORTABLE CHEST - 1 VIEW; PORTABLE ABDOMEN - 1 VIEW COMPARISON:  Radiograph 12/17/2016, 07/15/2008, CT 05/05/2008 FINDINGS: *Endotracheal tube tip terminates in the mid trachea, 5.4 cm from the carina. *A transesophageal tube tip and side port distal to the GE junction, terminating at the level of the distal gastric body. *Telemetry leads and external support devices overlie the chest. Diffuse interstitial opacities throughout both lungs with some additional patchy areas of peripheral consolidation. Pulmonary vascular congestion with fissural and septal thickening is noted as well. No pneumothorax or effusion. Prominence of the cardiac silhouette though may be related to portable technique. No worrisome bowel gas pattern is seen in the included portions of the abdomen. No subdiaphragmatic free air.  No suspicious abdominal calcifications. Degenerative changes are present in the spine and shoulders. IMPRESSION: 1. Endotracheal tube tip terminates in the mid trachea, 5.4 cm from the carina. 2. A transesophageal tube tip and side port distal to the GE junction terminating at the distal gastric body. 3. Diffuse interstitial opacities throughout both lungs with some additional patchy areas of peripheral consolidation. Findings could reflect atypical infection (including COVID 19) and/or developing pulmonary edema. Electronically Signed   By: Lovena Le M.D.   On: 12/08/2020 01:04    Procedures .Critical Care Performed by: Christy Gentles,  Elenore Rota, MD Authorized by: Ripley Fraise, MD   Critical care provider statement:    Critical care time (minutes):  62   Critical care start time:  12/08/2020 12:57 AM   Critical care end time:  12/08/2020 1:59 AM   Critical care time was exclusive of:  Separately billable procedures and treating other patients   Critical care was necessary to treat or prevent imminent or life-threatening deterioration of the following conditions:  Respiratory failure   Critical care was time spent personally by me on the following activities:  Discussions with consultants, evaluation of patient's response to treatment, examination of patient, re-evaluation of patient's condition, pulse oximetry, ordering and review of radiographic studies, ordering and review of laboratory studies, ordering and performing treatments and interventions, review of old charts, development of treatment plan with patient or surrogate, gastric intubation, ventilator management and obtaining history from patient or surrogate   I assumed direction of critical care for this patient from another provider in my specialty: no     Care discussed with: admitting provider   Procedure Name: Intubation Date/Time: 12/08/2020 12:40 AM Performed by: Ripley Fraise, MD Pre-anesthesia Checklist: Emergency Drugs available  and Suction available Oxygen Delivery Method: Ambu bag Preoxygenation: Pre-oxygenation with 100% oxygen Induction Type: Rapid sequence Ventilation: Mask ventilation without difficulty Laryngoscope Size: Glidescope Tube size: 7.5 mm Number of attempts: 1 Airway Equipment and Method: Video-laryngoscopy Placement Confirmation: ETT inserted through vocal cords under direct vision,  CO2 detector and Breath sounds checked- equal and bilateral Secured at: 25 cm Tube secured with: ETT holder    OG placement  Date/Time: 12/08/2020 12:45 AM Performed by: Ripley Fraise, MD Authorized by: Ripley Fraise, MD  Consent: The procedure was performed in an emergent situation. Local anesthesia used: no  Anesthesia: Local anesthesia used: no  Sedation: Patient sedated: yes  Patient tolerance: patient tolerated the procedure well with no immediate complications Comments: I personally placed the orogastric tube without difficulty    Medications Ordered in ED Medications  etomidate (AMIDATE) injection (30 mg Intravenous Given 12/08/20 0034)  rocuronium (ZEMURON) injection (100 mg Intravenous Given 12/08/20 0035)  propofol (DIPRIVAN) 1000 MG/100ML infusion (5 mcg/kg/min  125 kg Intravenous New Bag/Given 12/08/20 0123)  docusate (COLACE) 50 MG/5ML liquid 100 mg (has no administration in time range)  polyethylene glycol (MIRALAX / GLYCOLAX) packet 17 g (has no administration in time range)  enoxaparin (LOVENOX) injection 40 mg (has no administration in time range)  pantoprazole (PROTONIX) injection 40 mg (has no administration in time range)  ipratropium-albuterol (DUONEB) 0.5-2.5 (3) MG/3ML nebulizer solution 3 mL (has no administration in time range)  methylPREDNISolone sodium succinate (SOLU-MEDROL) 40 mg/mL injection 40 mg (has no administration in time range)  cefTRIAXone (ROCEPHIN) 2 g in sodium chloride 0.9 % 100 mL IVPB (has no administration in time range)  azithromycin (ZITHROMAX) 500  mg in sodium chloride 0.9 % 250 mL IVPB (has no administration in time range)  insulin aspart (novoLOG) injection 5 Units (5 Units Intravenous Given 12/08/20 0153)    And  dextrose 50 % solution 50 mL (50 mLs Intravenous Given 12/08/20 0153)    ED Course  I have reviewed the triage vital signs and the nursing notes.  Pertinent labs & imaging results that were available during my care of the patient were reviewed by me and considered in my medical decision making (see chart for details).    MDM Rules/Calculators/A&P  12:59 AM Patient seen on arrival for respiratory distress.  Patient was on CPAP but was unresponsive and was hypoxic.  Patient was placed on the stretcher, and Ambu bag was used.  We were able to get his pulse ox to 90%. I immediately intubated patient without difficulty.  Suspect hypercapnic respiratory failure with hypoxia. 2:00 AM Patient is now resting comfortably on the ventilator.  Arterial blood glass was reviewed and reveals hypercapnic respiratory failure. Patient is also noted to have hyperkalemia, this has been treated.  His EKG does reveal a wide-complex, but is unclear how acute this is Discussed the case with critical care who will admit the patient Discussed with wife Drenda Freeze, who reports over the past day or so he has had increasing shortness of breath.  He is usually on 2-3 L of nasal cannula, but that was increased.  She reports this is similar to prior episodes when he had hypercapnic respiratory failure.  Patient also has significant leukocytosis, and and wife reports that this has been notable for it was post to follow-up with hematology-oncology but never followed up Final Clinical Impression(s) / ED Diagnoses Final diagnoses:  COPD exacerbation ([place])  Acute respiratory failure with hypoxia ([place])  Hyperkalemia  Leukocytosis, unspecified type    Rx / DC Orders ED Discharge Orders    None       Ripley Fraise, MD 12/08/20  [phone]

## 2020-12-08 NOTE — TOC Initial Note (Signed)
Transition of Care [facility]) - Initial/Assessment Note    Patient Details  Name: Isaac [name] MRN: [id number] Date of Birth: 1956-03-27  Transition of [facility]) CM/SW Contact:    Verdell Carmine, RN Phone Number: 12/08/2020, 3:41 PM  Clinical Narrative:                 Patient from home COPD history of lymphoproliferative disease untreated, in with increased SHOB 4LPM oxygen at home, unresponsive in  ED intubated, May need Home health and continuation with oxygen at home. CM will follow for needs  Expected Discharge Plan: [place] Barriers to Discharge: Continued Medical Work up   Patient Goals and CMS Choice        Expected Discharge Plan and Services Expected Discharge Plan: [place]   Discharge Planning Services: CM Consult Post Acute Care Choice: Durable Medical Equipment (oxygen) Living arrangements for the past 2 months: Single Family Home                                      Prior Living Arrangements/Services Living arrangements for the past 2 months: Single Family Home Lives with:: Spouse Patient language and need for interpreter reviewed:: Yes        Need for Family Participation in Patient Care: Yes (Comment) Care giver support system in place?: Yes (comment) Current home services:  (home oxygen at 4LPM) Criminal Activity/Legal Involvement Pertinent to Current Situation/Hospitalization: No - Comment as needed  Activities of Daily Living      Permission Sought/Granted                  Emotional Assessment   Attitude/Demeanor/Rapport: Intubated (Following Commands or Not Following Commands) Affect (typically observed): Unable to Assess Orientation: : Fluctuating Orientation (Suspected and/or reported [place]) Alcohol / Substance Use: Tobacco Use Psych Involvement: No (comment)  Admission diagnosis:  Hyperkalemia [E87.5] COPD exacerbation (HCC) [J44.1] Acute respiratory failure with hypoxia  (HCC) [J96.01] Acute respiratory failure with hypercapnia (HCC) [J96.02] Leukocytosis, unspecified type [D72.829] Patient Active Problem List   Diagnosis Date Noted  . Acute respiratory failure with hypercapnia ([place]) 12/08/2020  . Hyperkalemia   . Leukocytosis   . AKI (acute kidney injury) ([place])   . Healthcare maintenance 08/12/2020  . Oral thrush 04/30/2017  . Dysfunction of right eustachian tube 01/19/2016  . Left ear impacted cerumen 01/19/2016  . COPD, very severe ([place]) 08/22/2015  . Chronic respiratory failure with hypercapnia ([place]) 06/08/2014  . Unspecified sleep apnea 06/08/2014  . Chronic diastolic heart failure ([place]) 05/11/2014  . Acute-on-chronic respiratory failure ([place]) 04/24/2014  . Sinusitis 04/13/2014  . Renal insufficiency 07/07/2012  . Hypertension 07/07/2012  . COPD exacerbation ([place]) 07/04/2012  . COPD, severe ([place]) 04/25/2012  . Sleep apnea 04/25/2012  . Obesity 04/25/2012   PCP:  Jani Gravel, MD Pharmacy:   [id number] - [place], [place] [address] Phone: [phone] Fax: [phone]  [facility] [place], [place] [address] [place] [address] Phone: [phone] Fax: [phone]     Social Determinants of Health (SDOH) Interventions    Readmission Risk Interventions No flowsheet data found.

## 2020-12-08 NOTE — Progress Notes (Signed)
Patient transported to 1O10 without complications.

## 2020-12-08 NOTE — ED Triage Notes (Signed)
Pt arrived via [facility] from home after pt was having shortness of breath. Pt was GCS of 15 at home. Pt initially didn't not want to come to ER. Pt received meds at home by EMS. Pt was put on CPAP by EMS. On arrival to ER pt was slumped over ems stretcher non responsive.

## 2020-12-08 NOTE — Progress Notes (Signed)
Initial Nutrition Assessment  DOCUMENTATION CODES:   Obesity unspecified  INTERVENTION:   Initiate tube feeding via OG tube: Vital High Protein at 50 ml/h (1200 ml per day) Prosource TF 45 ml QID  Provides 1360 kcal (1735 kcal total with kcal from propofol), 149 gm protein, 1003 ml free water daily  NUTRITION DIAGNOSIS:   Inadequate oral intake related to inability to eat as evidenced by NPO status.  GOAL:   Provide needs based on ASPEN/SCCM guidelines  MONITOR:   Vent status,Labs,TF tolerance  REASON FOR ASSESSMENT:   Ventilator,Consult Enteral/tube feeding initiation and management  ASSESSMENT:   65 yo male admitted with worsening SOB. He required intubation in the ED. PMH includes HLD, tobacco dependence, nephrolithiasis, COPD, GERD, HTN.   Discussed patient in ICU rounds and with RN today. Received MD Consult for TF initiation and management. OG tube in place. Patient's wife at bedside reports that patient was eating well PTA until the day of admission when he started feeling bad.  Patient is currently intubated on ventilator support MV: 16.2 L/min Temp (24hrs), Avg:97.7 F (36.5 C), Min:97.1 F (36.2 C), Max:99.3 F (37.4 C)  Propofol: 14.2 ml/hr providing 375 kcal from lipid.  Labs reviewed. Na 133, K 5.5, Phos 6.3, mag 2.7, triglycerides 210, A1C 6.7 CBG: 175-172-150-148  Medications reviewed and include Novolog, Solumedrol, Neosynephrine, propofol.  Usual weights reviewed. No significant weight changes noted.   NUTRITION - FOCUSED PHYSICAL EXAM:  Flowsheet Row Most Recent Value  Orbital Region No depletion  Upper Arm Region No depletion  Thoracic and Lumbar Region No depletion  Buccal Region Unable to assess  Temple Region No depletion  Clavicle Bone Region No depletion  Clavicle and Acromion Bone Region No depletion  Scapular Bone Region Unable to assess  Dorsal Hand No depletion  Patellar Region No depletion  Anterior Thigh Region No  depletion  Posterior Calf Region No depletion  Edema (RD Assessment) Mild  Hair Reviewed  Eyes Unable to assess  Mouth Unable to assess  Skin Reviewed  Nails Reviewed       Diet Order:   Diet Order            Diet NPO time specified  Diet effective now                 EDUCATION NEEDS:   Not appropriate for education at this time  Skin:  Skin Assessment: Reviewed RN Assessment  Last BM:  PTA  Height:   Ht Readings from Last 1 Encounters:  12/08/20 5\' 10"  (1.778 m)    Weight:   Wt Readings from Last 1 Encounters:  12/08/20 118.5 kg    Ideal Body Weight:  75.5 kg  BMI:  Body mass index is 37.48 kg/m.  Estimated Nutritional Needs:   Kcal:  4098-1191  Protein:  >/= 150 gm  Fluid:  1.8-2 L    Lucas Mallow, RD, LDN, CNSC Please refer to Amion for contact information.

## 2020-12-08 NOTE — Progress Notes (Signed)
Events noted.  Mr. Isaac [name] was seen in evaluation in 2019 for lymphocytosis and flow cytometry at that time showed no lymphoproliferative disorder consistent with CLL.  He has not followed up since 2019 regarding this condition and has not required treatment.   He was hospitalized for respiratory failure related to his COPD and unlikely related to his lymphoproliferative disorder.  His white cell count was 299,000 and dropped to 173 with predominantly lymphocytosis.  His peripheral smear showed predominantly lymphocytosis consistent with his lymphoproliferative disorder. Repeat flow cytometry was sent to fully quantify his leukocytosis.   Impression and recommendations: 65 year old man with COPD exacerbation and underlying CLL.  His leukocytosis is likely related to CLL progression as well as reactive leukocytosis associated with his respiratory failure.  I do not see evidence to suggest leukemic transformation or acute process.  We will await the results of his flow cytometry but no intervention for his CLL is required at this time.  We will arrange outpatient follow-up upon his discharge and recovery from his acute episode to discuss whether he would require treatment at that time.  Will continue to follow.

## 2020-12-08 NOTE — ED Notes (Signed)
Patient blood pressure decreased EDP and admitting MD aware, orders for decreased propofol and 1 liter NS orders done.

## 2020-12-08 NOTE — Progress Notes (Signed)
NAME:  Isaac [name], MRN:  [id number], DOB:  01-07-1956, LOS: 0 ADMISSION DATE:  12/08/2020, CONSULTATION DATE:  12/08/20 REFERRING MD:  EDP, CHIEF COMPLAINT:  Shortness of breath   Brief History:  65 y.o. y/o M with PMH of COPD on 4L home O2, HTN, HL, prior tobacco use who was brought to the ED by EMS for shortness of breath.  Just prior to arriving to the ED, pt became unresponsive and was intubated on arrival.  ABG with severe Hypercapnic respiratory failure and CXR with possible infiltrates  History of Present Illness:  Isaac [name] is a 65 y.o. M with PMH of  COPD on 4L home O2 followed by Dr. Chase Caller, HTN, HL, prior tobacco use who called EMS for worsening shortness of breath.  En route to the ED, he became unresponsive and required intubation on arrival.   Further history obtained from patient's wife, she states that he has been compliant with wearing home O2, does not wear CPAP or Bipap and that his dyspnea started with mild cough 2-3 days ago.  No known fever, but felt warm.  He is vaccinated, but not boosted for Covid-19.  In the ED, workup was significant for hypercapnic respiratory failure with ABG 7.14/101/190/33,  WBC 299k, potassium 6.0 and possibly patchy infiltrates on CXR.  Covid-19 and influenza are negative.  He has had an elevated WBC in the past and was referred to Oncology, he was not able to make his last appointment and has not scheduled a follow up as he was feeling ok.  PCCM consulted for admission  Past Medical History:   has a past medical history of Anxiety, COPD (chronic obstructive pulmonary disease) ([place]), GERD (gastroesophageal reflux disease), Hematuria, Hyperlipidemia, Hypertension, Insomnia, Nephrolithiasis, and Tobacco dependence.  Significant Hospital Events:  1/13 Intubated in ED, PCCM admit  Consults:  Oncology  Procedures:  1/13 ETT  Significant Diagnostic Tests:  1/13 CXR >> Diffuse interstitial opacities throughout both lungs with some  additional patchy areas of peripheral consolidation. Findings could reflect atypical infection (including COVID 19) and/or developing pulmonary edema.  Micro Data:  1/13 Covid-19, Flu >> Negative 1/13 Urine Culture >>  Antimicrobials:  Ceftriaxone 1/13 >> Azithromycin 1/13 >>   Interim History / Subjective:   Isaac [name] was admitted overnight. Since arrival to the unit, no overnight events. He is responding to questions appropriately this AM by nodding; wife is at bedside. He endorses peripheral chest pain with breathes but no central CP. He denies any other acute pain at this time. He expresses understanding of what brought him to the hospital.   Objective   Blood pressure 102/63, pulse 83, temperature 99.1 F (37.3 C), temperature source Oral, resp. rate (!) 28, height 5\' 10"  (1.778 m), weight 118.5 kg, SpO2 92 %.    Vent Mode: PRVC FiO2 (%):  [40 %-100 %] 40 % Set Rate:  [14 bmp-28 bmp] 28 bmp Vt Set:  [580 mL] 580 mL PEEP:  [5 cmH20] 5 cmH20 Plateau Pressure:  [20 cmH20-27 cmH20] 27 cmH20   Intake/Output Summary (Last 24 hours) at 12/08/2020 0731 Last data filed at 12/08/2020 0700 Gross per 24 hour  Intake 80.39 ml  Output 440 ml  Net -359.61 ml   Filed Weights   12/08/20 0043 12/08/20 0442  Weight: 125 kg 118.5 kg   Physical Exam Vitals and nursing note reviewed.  Constitutional:      Appearance: He is obese. He is ill-appearing and diaphoretic.     Interventions: He is  intubated.  HENT:     Head: Normocephalic and atraumatic.  Eyes:     Extraocular Movements: Extraocular movements intact.     Pupils: Pupils are equal, round, and reactive to light.  Cardiovascular:     Rate and Rhythm: Normal rate and regular rhythm.     Heart sounds: No murmur heard.   Pulmonary:     Effort: No accessory muscle usage. He is intubated.     Breath sounds: Wheezing present. No rhonchi or rales.  Abdominal:     Palpations: Abdomen is soft. There is no hepatomegaly.      Tenderness: There is no abdominal tenderness. There is no guarding.  Musculoskeletal:     Right lower leg: No tenderness. No edema.     Left lower leg: No tenderness. No edema.  Skin:    General: Skin is warm.  Neurological:     General: No focal deficit present.     Mental Status: He is alert.  Psychiatric:        Mood and Affect: Mood normal.        Behavior: Behavior normal.    Resolved Hospital Problem list   N/A  Assessment & Plan:   # Acute on Chronic Hypoxic and Hypercapnic Respiratory Failure: Severe underlying COPD with last PFTs in 2015 showing a FEV1/FVC of 29.   - Maintain full vent support with SAT/SBT as tolerated - HOB elevated 30 degrees. - Plateau pressures less than 30 cm H20  - Bronchial hygiene and RT/bronchodilator protocol. - Sedation with propofol, prn Fentanyl - Continue Azithromycin and Ceftriaxone - Continue Solumedrol - Scheduled nebulizers  # Severe Leukocytosis: Likely CLL given lymphocytic predominance. Per chart review, patient had an appointment with Oncology that was cancelled in 06/2020.   - Oncology consulted on 1/13 AM  - Pathologist review of peripheral smear pending  - Flow cytometry   # Acute Kidney Injury  # Hyperkalemia # Hyperphosphatemia  # Hypermagnesemia  # Elevated Uric Acid  Lectrolyte abnormalities concerning for tumor lysis syndrome in the setting of possible CLL. Uric acid significantly elevated. Will start Rasburicase and monitor closely.   - Rasburicase 6 mg daily  - G6P level ordered  - Follow up Uric Acid level tomorrow  - Lokelma 10 g once - Start maintenance IVF, NS @ 75 cc/hr - Repeat BMP this PM   # Hypotension: Possibly in the setting of sedation and no evidence of shock at this time.  # Hx of Hypertension   - Home home anti-hypertensives (Bisprolol, Losartan, HCTZ, Diovan, Clonidine) - Phenylephrine to maintain MAP > 65. Wean as tolerated    Best practice (evaluated daily)  Diet: Tube Feeds   Pain/Anxiety/Delirium protocol (if indicated): Propofol, Fentanyl VAP protocol (if indicated): HOB 30 degrees, suction prn DVT prophylaxis: Lovenox GI prophylaxis: Protonix Glucose control: SSI Mobility: bed rest Disposition: ICU Family Communication: Patient's wife updated at bedside   Goals of Care:  Last date of multidisciplinary goals of care discussion: Family and staff present:  Summary of discussion:  Follow up goals of care discussion due: 1/20 Code Status: Full code  Labs   CBC: Recent Labs  Lab 12/08/20 0100 12/08/20 0226 12/08/20 0501  WBC 299.1*  --  172.3*  NEUTROABS 8.9*  --   --   HGB 11.5* 14.3 10.9*  HCT 42.5 42.0 37.4*  MCV 92.0  --  89.7  PLT 364  --  123456    Basic Metabolic Panel: Recent Labs  Lab 12/08/20 0100 12/08/20 0203 12/08/20  0226 12/08/20 0501  NA 134*  --  131* 133*  K 6.0*  --  4.3 5.5*  CL 90*  --   --  91*  CO2 33*  --   --  26  GLUCOSE 237*  --   --  174*  BUN 19  --   --  25*  CREATININE 1.35*  --   --  1.46*  CALCIUM 8.6*  --   --  8.2*  MG  --  3.2*  --  2.7*  PHOS  --  7.7*  --  6.3*   GFR: Estimated Creatinine Clearance: 65.1 mL/min (A) (by C-G formula based on SCr of 1.46 mg/dL (H)). Recent Labs  Lab 12/08/20 0100 12/08/20 0501  WBC 299.1* 172.3*    Liver Function Tests: No results for input(s): AST, ALT, ALKPHOS, BILITOT, PROT, ALBUMIN in the last 168 hours. No results for input(s): LIPASE, AMYLASE in the last 168 hours. No results for input(s): AMMONIA in the last 168 hours.  ABG    Component Value Date/Time   PHART 7.268 (L) 12/08/2020 0226   PCO2ART 77.5 (HH) 12/08/2020 0226   PO2ART 74 (L) 12/08/2020 0226   HCO3 35.7 (H) 12/08/2020 0226   TCO2 38 (H) 12/08/2020 0226   O2SAT 92.0 12/08/2020 0226     Coagulation Profile: No results for input(s): INR, PROTIME in the last 168 hours.  Cardiac Enzymes: No results for input(s): CKTOTAL, CKMB, CKMBINDEX, TROPONINI in the last 168 hours.  HbA1C: Hgb  A1c MFr Bld  Date/Time Value Ref Range Status  12/08/2020 05:01 AM 6.7 (H) 4.8 - 5.6 % Final    Comment:    (NOTE) Pre diabetes:          5.7%-6.4%  Diabetes:              >6.4%  Glycemic control for   <7.0% adults with diabetes     CBG: Recent Labs  Lab 12/08/20 0408 12/08/20 0715  GLUCAP 175* 150*    Review of Systems:   Unable to obtain secondary to intubated  Past Medical History:  He,  has a past medical history of Anxiety, COPD (chronic obstructive pulmonary disease) ([place]), GERD (gastroesophageal reflux disease), Hematuria, Hyperlipidemia, Hypertension, Insomnia, Nephrolithiasis, and Tobacco dependence.   Surgical History:   Past Surgical History:  Procedure Laterality Date  . LITHOTRIPSY       Social History:   reports that he quit smoking about 12 years ago. His smoking use included cigarettes. He has a 35.00 pack-year smoking history. He has never used smokeless tobacco.   Family History:  His family history includes Heart disease in his father; Leukemia in his brother; Stroke in his mother.   Allergies Allergies  Allergen Reactions  . Codeine Itching    "jittery"  . Prozac [Fluoxetine Hcl]     confusion     Home Medications  Prior to Admission medications   Medication Sig Start Date End Date Taking? Authorizing Provider  albuterol (PROVENTIL) (2.5 MG/3ML) 0.083% nebulizer solution USE 1 VIAL IN NEBULIZER AS NEEDED FOR WHEEZING/SHORTNESS OF BREATH 10/19/20   Brand Males, MD  albuterol (VENTOLIN HFA) 108 (90 Base) MCG/ACT inhaler Inhale 2 puffs into the lungs every 4 (four) hours as needed for wheezing or shortness of breath. 12/08/19   Brand Males, MD  bisoprolol (ZEBETA) 5 MG tablet Take 5 mg by mouth daily. Two times daily    [provider]  budesonide (PULMICORT) 0.5 MG/2ML nebulizer solution TAKE 2 ML BY NEBULIZATION  2 TIMES DAILY. 06/17/20   Brand Males, MD  Cinnamon 500 MG capsule Take 500 mg by mouth daily.     [provider]  cloNIDine (CATAPRES) 0.2 MG tablet Take 0.2 mg by mouth 3 (three) times daily.    [provider]  fluticasone (FLONASE) 50 MCG/ACT nasal spray SPRAY 2 SPRAYS INTO EACH NOSTRIL EVERY DAY 10/19/20   Brand Males, MD  ipratropium (ATROVENT) 0.02 % nebulizer solution INHALE 1 VIAL IN NEBULIZER 4 TIMES A DAY 11/09/20   Brand Males, MD  losartan-hydrochlorothiazide (HYZAAR) 50-12.5 MG per tablet Take 1 tablet by mouth daily.    [provider]  montelukast (SINGULAIR) 10 MG tablet Take 10 mg by mouth at bedtime.    [provider]  nystatin (MYCOSTATIN) 100000 UNIT/ML suspension Take 5 mLs (500,000 Units total) by mouth 4 (four) times daily. 01/07/18   Brand Males, MD  nystatin (MYCOSTATIN) 100000 UNIT/ML suspension Take 5 mLs (500,000 Units total) by mouth 4 (four) times daily. 02/03/19   Brand Males, MD  omeprazole (PRILOSEC) 20 MG capsule Take 2 capsules (40 mg total) by mouth daily. For GERD. 07/09/12   Erick Colace, NP  sodium chloride (OCEAN) 0.65 % SOLN nasal spray Place 2 sprays into both nostrils as needed for congestion. Patient taking differently: Place 2 sprays into both nostrils daily as needed for congestion.  05/05/14   Erick Colace, NP  valsartan-hydrochlorothiazide (DIOVAN-HCT) 160-12.5 MG tablet 1 TABLET ONCE A DAY (REPLACES LOSARTAN/HYDROCHLOROTHIAZIDE) ORALLY 90 05/13/19   [provider]    Dr. Jose Persia Internal Medicine PGY-2  12/08/2020, 12:28 PM

## 2020-12-08 NOTE — Progress Notes (Signed)
  Echocardiogram 2D Echocardiogram has been performed.  Randa Lynn Dance 12/08/2020, 9:19 AM

## 2020-12-09 ENCOUNTER — Inpatient Hospital Stay (HOSPITAL_COMMUNITY): Payer: BC Managed Care – PPO

## 2020-12-09 DIAGNOSIS — R509 Fever, unspecified: Secondary | ICD-10-CM | POA: Diagnosis not present

## 2020-12-09 DIAGNOSIS — M7989 Other specified soft tissue disorders: Secondary | ICD-10-CM

## 2020-12-09 DIAGNOSIS — C9190 Lymphoid leukemia, unspecified not having achieved remission: Secondary | ICD-10-CM

## 2020-12-09 DIAGNOSIS — J9600 Acute respiratory failure, unspecified whether with hypoxia or hypercapnia: Secondary | ICD-10-CM

## 2020-12-09 DIAGNOSIS — J9602 Acute respiratory failure with hypercapnia: Secondary | ICD-10-CM | POA: Diagnosis not present

## 2020-12-09 DIAGNOSIS — C9110 Chronic lymphocytic leukemia of B-cell type not having achieved remission: Secondary | ICD-10-CM | POA: Diagnosis not present

## 2020-12-09 DIAGNOSIS — J441 Chronic obstructive pulmonary disease with (acute) exacerbation: Secondary | ICD-10-CM | POA: Diagnosis not present

## 2020-12-09 LAB — CBC WITH DIFFERENTIAL/PLATELET
Abs Immature Granulocytes: 1.35 10*3/uL — ABNORMAL HIGH (ref 0.00–0.07)
Basophils Absolute: 1.1 10*3/uL — ABNORMAL HIGH (ref 0.0–0.1)
Basophils Relative: 1 %
Eosinophils Absolute: 0 10*3/uL (ref 0.0–0.5)
Eosinophils Relative: 0 %
HCT: 35.6 % — ABNORMAL LOW (ref 39.0–52.0)
Hemoglobin: 10.8 g/dL — ABNORMAL LOW (ref 13.0–17.0)
Immature Granulocytes: 1 %
Lymphocytes Relative: 79 %
Lymphs Abs: 172.7 10*3/uL — ABNORMAL HIGH (ref 0.7–4.0)
MCH: 27.3 pg (ref 26.0–34.0)
MCHC: 30.3 g/dL (ref 30.0–36.0)
MCV: 90.1 fL (ref 80.0–100.0)
Monocytes Absolute: 27.6 10*3/uL — ABNORMAL HIGH (ref 0.1–1.0)
Monocytes Relative: 13 %
Neutro Abs: 13.3 10*3/uL — ABNORMAL HIGH (ref 1.7–7.7)
Neutrophils Relative %: 6 %
Platelets: UNDETERMINED 10*3/uL (ref 150–400)
RBC: 3.95 MIL/uL — ABNORMAL LOW (ref 4.22–5.81)
RDW: 13.9 % (ref 11.5–15.5)
WBC: 216.1 10*3/uL (ref 4.0–10.5)
nRBC: 0 % (ref 0.0–0.2)

## 2020-12-09 LAB — BASIC METABOLIC PANEL
Anion gap: 10 (ref 5–15)
Anion gap: 10 (ref 5–15)
BUN: 41 mg/dL — ABNORMAL HIGH (ref 8–23)
BUN: 45 mg/dL — ABNORMAL HIGH (ref 8–23)
CO2: 27 mmol/L (ref 22–32)
CO2: 30 mmol/L (ref 22–32)
Calcium: 8 mg/dL — ABNORMAL LOW (ref 8.9–10.3)
Calcium: 8.1 mg/dL — ABNORMAL LOW (ref 8.9–10.3)
Chloride: 98 mmol/L (ref 98–111)
Chloride: 98 mmol/L (ref 98–111)
Creatinine, Ser: 1.4 mg/dL — ABNORMAL HIGH (ref 0.61–1.24)
Creatinine, Ser: 1.29 mg/dL — ABNORMAL HIGH (ref 0.61–1.24)
GFR, Estimated: 56 mL/min — ABNORMAL LOW (ref >60)
GFR, Estimated: >60 mL/min (ref >60)
Glucose, Bld: 185 mg/dL — ABNORMAL HIGH (ref 70–99)
Glucose, Bld: 197 mg/dL — ABNORMAL HIGH (ref 70–99)
Potassium: 6.2 mmol/L — ABNORMAL HIGH (ref 3.5–5.1)
Potassium: 5.9 mmol/L — ABNORMAL HIGH (ref 3.5–5.1)
Sodium: 135 mmol/L (ref 135–145)
Sodium: 138 mmol/L (ref 135–145)

## 2020-12-09 LAB — MAGNESIUM
Magnesium: 2.2 mg/dL (ref 1.7–2.4)
Magnesium: 3.1 mg/dL — ABNORMAL HIGH (ref 1.7–2.4)

## 2020-12-09 LAB — GLUCOSE, CAPILLARY
Glucose-Capillary: 160 mg/dL — ABNORMAL HIGH (ref 70–99)
Glucose-Capillary: 188 mg/dL — ABNORMAL HIGH (ref 70–99)
Glucose-Capillary: 164 mg/dL — ABNORMAL HIGH (ref 70–99)
Glucose-Capillary: 191 mg/dL — ABNORMAL HIGH (ref 70–99)
Glucose-Capillary: 199 mg/dL — ABNORMAL HIGH (ref 70–99)
Glucose-Capillary: 193 mg/dL — ABNORMAL HIGH (ref 70–99)

## 2020-12-09 LAB — GLUCOSE 6 PHOSPHATE DEHYDROGENASE
G6PDH: 16.4 U/g{Hb} — ABNORMAL HIGH (ref 4.8–15.7)
Hemoglobin: 11.5 g/dL — ABNORMAL LOW (ref 13.0–17.7)

## 2020-12-09 LAB — RASBURICASE - URIC ACID: Uric Acid, Serum: 6.4 mg/dL (ref 3.7–8.6)

## 2020-12-09 LAB — URINE CULTURE: Culture: <10000 — AB

## 2020-12-09 LAB — PHOSPHORUS
Phosphorus: 7 mg/dL — ABNORMAL HIGH (ref 2.5–4.6)
Phosphorus: 6.4 mg/dL — ABNORMAL HIGH (ref 2.5–4.6)

## 2020-12-09 LAB — PATHOLOGIST SMEAR REVIEW

## 2020-12-09 LAB — URIC ACID: Uric Acid, Serum: 4.9 mg/dL (ref 3.7–8.6)

## 2020-12-09 MED ORDER — MIDAZOLAM HCL 2 MG/2ML IJ SOLN
1.0000 mg | INTRAMUSCULAR | Status: DC | PRN
  Administered 2020-12-09: 1 mg via INTRAVENOUS
  Filled 2020-12-09: qty 2

## 2020-12-09 MED ORDER — MIDAZOLAM HCL 2 MG/2ML IJ SOLN: 1.0000 mg | INTRAMUSCULAR | Status: DC | PRN

## 2020-12-09 MED ORDER — MIDAZOLAM HCL 2 MG/2ML IJ SOLN
1.0000 mg | INTRAMUSCULAR | Status: DC | PRN
  Administered 2020-12-09 – 2020-12-10 (×2): 1 mg via INTRAVENOUS
  Filled 2020-12-09 (×2): qty 2

## 2020-12-09 MED ORDER — ALLOPURINOL 100 MG PO TABS
100.0000 mg | ORAL_TABLET | Freq: Three times a day (TID) | ORAL | Status: DC
  Administered 2020-12-09 – 2020-12-11 (×6): 100 mg
  Filled 2020-12-09 (×7): qty 1

## 2020-12-09 NOTE — Progress Notes (Signed)
Events overnight noted and laboratory data reviewed.  Patient remains intubated for respiratory failure.  CBC noted white cell count of 200,000 with predominant lymphocytes consistent with his baseline lymphoproliferative disorder.  Flow cytometry is currently pending without any clear evidence of transformation to an acute leukemic process.  Tumor lysis markers persist at this time but creatinine is improving down to 1.4 although his phosphorus is elevated with calcium remains low.  At this time, I do not recommend any specific treatment for his CLL given his overall clinical status.  I agree with supportive management at this time and potential treating his CLL when he recovers from this acute respiratory episode.  His prognosis is overall guarded given his respiratory failure and tumor lysis syndrome associated with CLL.  Will continue to follow.

## 2020-12-09 NOTE — Plan of Care (Signed)
?  Problem: Safety: ?Goal: Non-violent Restraint(s) ?Outcome: Progressing ?  ?Problem: Education: ?Goal: Knowledge of General Education information will improve ?Description: Including pain rating scale, medication(s)/side effects and non-pharmacologic comfort measures ?Outcome: Progressing ?  ?Problem: Health Behavior/Discharge Planning: ?Goal: Ability to manage health-related needs will improve ?Outcome: Progressing ?  ?Problem: Clinical Measurements: ?Goal: Ability to maintain clinical measurements within normal limits will improve ?Outcome: Progressing ?Goal: Will remain free from infection ?Outcome: Progressing ?Goal: Diagnostic test results will improve ?Outcome: Progressing ?Goal: Respiratory complications will improve ?Outcome: Progressing ?Goal: Cardiovascular complication will be avoided ?Outcome: Progressing ?  ?Problem: Activity: ?Goal: Risk for activity intolerance will decrease ?Outcome: Progressing ?  ?Problem: Nutrition: ?Goal: Adequate nutrition will be maintained ?Outcome: Progressing ?  ?Problem: Coping: ?Goal: Level of anxiety will decrease ?Outcome: Progressing ?  ?Problem: Elimination: ?Goal: Will not experience complications related to bowel motility ?Outcome: Progressing ?Goal: Will not experience complications related to urinary retention ?Outcome: Progressing ?  ?Problem: Pain Managment: ?Goal: General experience of comfort will improve ?Outcome: Progressing ?  ?Problem: Safety: ?Goal: Ability to remain free from injury will improve ?Outcome: Progressing ?  ?Problem: Skin Integrity: ?Goal: Risk for impaired skin integrity will decrease ?Outcome: Progressing ?  ?Problem: Activity: ?Goal: Ability to tolerate increased activity will improve ?Outcome: Progressing ?  ?Problem: Respiratory: ?Goal: Ability to maintain a clear airway and adequate ventilation will improve ?Outcome: Progressing ?  ?Problem: Role Relationship: ?Goal: Method of communication will improve ?Outcome: Progressing ?  ?

## 2020-12-09 NOTE — Progress Notes (Signed)
Lower extremity venous bilateral study completed.   Please see CV Proc for preliminary results.   Anijah Spohr, RDMS  

## 2020-12-09 NOTE — Progress Notes (Signed)
   12/09/20 0741  Airway 7.5 mm  Placement Date/Time: 12/08/20 0040   Placed By: ED Physician  Airway Device: Endotracheal Tube  Laryngoscope Blade: 3  ETT Types: Oral  Size (mm): 7.5 mm  Cuffed: Cuffed  Insertion attempts: 1  Airway Equipment: Stylet;Video Laryngoscope  Placement Co...  Secured at (cm) 26 cm  Measured From Lips  Secured Location Left  Secured By Actuary Repositioned Yes  Prone position No  Cuff Pressure (cm H2O) 30 cm H2O  Site Condition Dry  Adult Ventilator Settings  Vent Type Servo i  Humidity HME  Vent Mode PSV;CPAP  FiO2 (%) 40 %  I Time 0.7 Sec(s)  Pressure Support 10 cmH20  PEEP 5 cmH20  Adult Ventilator Measurements  Peak Airway Pressure 16 L/min  Mean Airway Pressure 7 cmH20  Resp Rate Spontaneous 12 br/min  Resp Rate Total 12 br/min  Spont TV 613 mL  Measured Ve 7.1 mL  Auto PEEP 0 cmH20  Total PEEP 5 cmH20  SpO2 93 %  Adult Ventilator Alarms  Alarms On Y  Ve High Alarm 18 L/min  Ve Low Alarm 3 L/min  Resp Rate High Alarm 40 br/min  Resp Rate Low Alarm 8  PEEP Low Alarm 3 cmH2O  Press High Alarm 46 cmH2O  T Apnea 20 sec(s)  VAP Prevention  Cuff pressure (initial) 30 cm H2O  Cuff pressure after change 30 cm H2O  HME changed No  Ventilator changed No  HOB> 30 Degrees Y  Equipment wiped down Yes  Daily Weaning Assessment  Daily Assessment of Readiness to Wean Wean protocol criteria met (SBT performed)  SBT Method CPAP 5 cm H20 and PS 5 cm H20 (PS 10)  Weaning Start Time 0741  Breath Sounds  Bilateral Breath Sounds Diminished  Airway Suctioning/Secretions  Suction Type ETT  Suction Device  Catheter  Secretion Amount Small  Secretion Color Tan  Secretion Consistency Thick  Suction Tolerance Tolerated well  Suctioning Adverse Effects None

## 2020-12-09 NOTE — Progress Notes (Signed)
Patients labs drawn by phlebotomy but not resulted in chart. Multiple calls made throughout the night to lab but each time stating that they had no results yet for potassium. Elink MD notified and aware.

## 2020-12-09 NOTE — Progress Notes (Signed)
[facility] Progress Note Patient Name: Isaac [name] DOB: 01/19/1956 MRN: [id number]   Date of Service  12/09/2020  HPI/Events of Note  Notified that the lab could not run rasburicase-uric acid.  [facility] also does not run it either.  Followed up BMP once again.   eICU Interventions  Rasburicase-uric acid order discontinued.      Intervention Category Minor Interventions: Other:  Elsie Lincoln 12/09/2020, 4:38 AM

## 2020-12-09 NOTE — Progress Notes (Signed)
[facility] Progress Note Patient Name: Isaac [name] DOB: Apr 09, 1956 MRN: [id number]   Date of Service  12/09/2020  HPI/Events of Note  K+ 5.9, patient is on [place] with next scheduled dose at 22:00 hours.  eICU Interventions  Bedside RN  Instructed to give Lokelma dose scheduled for 22:00 hours now.        [name] Yovanni Frenette 12/09/2020, 8:28 PM

## 2020-12-09 NOTE — Progress Notes (Signed)
NAME:  Isaac [name], MRN:  [id number], DOB:  [birth year], LOS: 1 ADMISSION DATE:  12/08/2020, CONSULTATION DATE:  12/09/20 REFERRING MD:  EDP, CHIEF COMPLAINT:  Shortness of breath   Brief History:  65 y.o. y/o M with PMH of COPD on 4L home O2, HTN, HL, prior tobacco use who was brought to the ED by EMS for shortness of breath.  Just prior to arriving to the ED, pt became unresponsive and was intubated on arrival.  ABG with severe Hypercapnic respiratory failure and CXR with possible infiltrates  History of Present Illness:  Isaac [name] is a 65 y.o. M with PMH of  COPD on 4L home O2 followed by Dr. Chase Caller, HTN, HL, prior tobacco use who called EMS for worsening shortness of breath.  En route to the ED, he became unresponsive and required intubation on arrival.   Further history obtained from patient's wife, she states that he has been compliant with wearing home O2, does not wear CPAP or Bipap and that his dyspnea started with mild cough 2-3 days ago.  No known fever, but felt warm.  He is vaccinated, but not boosted for Covid-19.  In the ED, workup was significant for hypercapnic respiratory failure with ABG 7.14/101/190/33,  WBC 299k, potassium 6.0 and possibly patchy infiltrates on CXR.  Covid-19 and influenza are negative.  He has had an elevated WBC in the past and was referred to Oncology, he was not able to make his last appointment and has not scheduled a follow up as he was feeling ok.  PCCM consulted for admission  Past Medical History:   has a past medical history of Anxiety, COPD (chronic obstructive pulmonary disease) ([place]), GERD (gastroesophageal reflux disease), Hematuria, Hyperlipidemia, Hypertension, Insomnia, Nephrolithiasis, and Tobacco dependence.  Significant Hospital Events:  1/13 Intubated in ED, PCCM admit  Consults:  Oncology  Procedures:  1/13 ETT  Significant Diagnostic Tests:  1/13 CXR >> Diffuse interstitial opacities throughout both lungs with some  additional patchy areas of peripheral consolidation. Findings could reflect atypical infection (including COVID 19) and/or developing pulmonary edema.  Micro Data:  1/13 Covid-19, Flu >> Negative 1/13 Urine Culture >>  Antimicrobials:  Ceftriaxone 1/13 >> Azithromycin 1/13 >>   Interim History / Subjective:   No overnight events.   This morning, Isaac [name] endorses continued chest pain peripherally when ventilator breathes but denies any substernal chest pain. He denies any other acute complaints.   His wife is at bedside who notes that they were aware of CLL diagnosis. She states that Mr. Alean Rinne brother and sister both have abnormal cell counts and were told they have leukemia. They were told it stemmed from their grandparents incest (they were first cousins). Isaac [name] states her biggest concern at this time is her husband's breathing and not his cell counts.   Objective   Blood pressure (!) 128/102, pulse 79, temperature 98.4 F (36.9 C), temperature source Oral, resp. rate (!) 28, height '5\' 10"'  (1.778 m), weight 122 kg, SpO2 93 %.    Vent Mode: PSV;CPAP FiO2 (%):  [40 %] 40 % Set Rate:  [28 bmp] 28 bmp Vt Set:  [580 mL] 580 mL PEEP:  [5 cmH20] 5 cmH20 Pressure Support:  [10 QKM63-81 cmH20] 10 cmH20 Plateau Pressure:  [21 cmH20-24 cmH20] 21 cmH20   Intake/Output Summary (Last 24 hours) at 12/09/2020 0752 Last data filed at 12/09/2020 0700 Gross per 24 hour  Intake 5910.07 ml  Output 1750 ml  Net 4160.07 ml   Danley Danker  Weights   12/08/20 0043 12/08/20 0442 12/09/20 0500  Weight: 125 kg 118.5 kg 122 kg   Physical Exam Vitals and nursing note reviewed.  Constitutional:      Appearance: He is obese. He is ill-appearing.     Interventions: He is intubated.  HENT:     Head: Normocephalic and atraumatic.  Eyes:     Extraocular Movements: Extraocular movements intact.     Pupils: Pupils are equal, round, and reactive to light.  Cardiovascular:     Rate and Rhythm:  Normal rate and regular rhythm.     Heart sounds: No murmur heard.   Pulmonary:     Effort: No accessory muscle usage. He is intubated.     Breath sounds: Examination of the left-lower field reveals rales. Wheezing and rales present. No rhonchi.  Abdominal:     Palpations: Abdomen is soft. There is no hepatomegaly.     Tenderness: There is no abdominal tenderness. There is no guarding.  Musculoskeletal:     Right lower leg: No tenderness. Edema (1+ pitting) present.     Left lower leg: No tenderness. No edema.  Skin:    General: Skin is warm.     Comments: Right lower extremity significantly warmer than the left.  Neurological:     General: No focal deficit present.     Mental Status: He is easily aroused. He is lethargic.  Psychiatric:        Mood and Affect: Mood normal.        Behavior: Behavior normal. Behavior is cooperative.    Resolved Hospital Problem list   N/A  Assessment & Plan:   # Acute on Chronic Hypoxic and Hypercapnic Respiratory Failure: Severe underlying COPD with last PFTs in 2015 showing a FEV1/FVC of 29.   - Maintain full vent support with SAT/SBT as tolerated - HOB elevated 30 degrees. - Plateau pressures less than 30 cm H20  - Bronchial hygiene and RT/bronchodilator protocol. - Sedation with propofol, prn Fentanyl - Continue Azithromycin and Ceftriaxone, Day 3/5 - Continue Solumedrol 40 mg BID - Scheduled nebulizers (Duoneb q6h)   # Chronic Lymphocytic Leukemia:  WBC today increased to 216 thousand. Per further chart review, looks like patient did follow-up with Dr. Alen Blew in 2019 and was told at that time he had confirmed CLL.  I am unable to find any flow cytometry or bone marrow biopsy results.  Unfortunately, there is a concern that patient and his wife did not understand that leukemia is a type of cancer.  - Oncology consulted on 1/13 AM. Will reconsult today. - Pathologist review of peripheral smear pending   # Acute Kidney Injury: Creatinine  improving with adequate urine output # Tumor lysis syndrome: Labs consistent with TLS in the setting of known CLL and initiation of high-dose steroids on admission for treatment of COPD exacerbation.  Uric acid level obtained yesterday and was elevated at 15.4.  He received one-time dose of rasburicase and had significant improvement today with repeat uric acid of 6.4.  Potassium and phosphorus continues to be elevated though.  - S/P Rasburicase on 1/13 - G6P level pending - Lokelma 10 g TID - Continue maintenance IVF, NS @ 75 cc/hr - BMP, phosphorus, magnesium levels BID - Repeat uric acid in 3 days to determine if additional dose of Rasburicase is needed - Start Allopurinol 100 mg TID  # Hypotension: Possibly in the setting of sedation and no evidence of shock at this time.  # Hx of Hypertension   -  Home home anti-hypertensives (Bisprolol, Losartan, HCTZ, Diovan, Clonidine) - Phenylephrine to maintain MAP > 65. Wean as tolerated   # Unilateral edema: On examination today, patient had new increased warmth and edema of the right leg compared to the left.  - Right venous duplex to evaluate for DVT  Best practice (evaluated daily)  Diet: Tube Feeds  Pain/Anxiety/Delirium protocol (if indicated): Propofol, Fentanyl VAP protocol (if indicated): HOB 30 degrees, suction prn DVT prophylaxis: Lovenox GI prophylaxis: Protonix Glucose control: SSI Mobility: bed rest Disposition: ICU Family Communication: Patient's wife updated at bedside   Goals of Care:  Last date of multidisciplinary goals of care discussion: Family and staff present:  Summary of discussion:  Follow up goals of care discussion due: 1/20 Code Status: Full code  Labs   CBC: Recent Labs  Lab 12/08/20 0100 12/08/20 0226 12/08/20 0501 12/09/20 0445  WBC 299.1*  --  172.3* 216.1*  NEUTROABS 8.9*  --   --  13.3*  HGB 11.5* 14.3 10.9* 10.8*  HCT 42.5 42.0 37.4* 35.6*  MCV 92.0  --  89.7 90.1  PLT 364  --  305  PLATELET CLUMPS NOTED ON SMEAR, UNABLE TO ESTIMATE    Basic Metabolic Panel: Recent Labs  Lab 12/08/20 0100 12/08/20 0203 12/08/20 0226 12/08/20 0501 12/08/20 1046 12/08/20 1653 12/09/20 0445  NA 134*  --  131* 133*  --  135  --   K 6.0*  --  4.3 5.5*  --  6.2*  --   CL 90*  --   --  91*  --  94*  --   CO2 33*  --   --  26  --  28  --   GLUCOSE 237*  --   --  174*  --  183*  --   BUN 19  --   --  25*  --  38*  --   CREATININE 1.35*  --   --  1.46*  --  1.74*  --   CALCIUM 8.6*  --   --  8.2*  --  8.2*  --   MG  --  3.2*  --  2.7* 2.8* 2.8* 2.2  PHOS  --  7.7*  --  6.3* 6.6* 6.7* 7.0*   GFR: Estimated Creatinine Clearance: 55.4 mL/min (A) (by C-G formula based on SCr of 1.74 mg/dL (H)). Recent Labs  Lab 12/08/20 0100 12/08/20 0501 12/09/20 0445  WBC 299.1* 172.3* 216.1*    Liver Function Tests: No results for input(s): AST, ALT, ALKPHOS, BILITOT, PROT, ALBUMIN in the last 168 hours. No results for input(s): LIPASE, AMYLASE in the last 168 hours. No results for input(s): AMMONIA in the last 168 hours.  ABG    Component Value Date/Time   PHART 7.268 (L) 12/08/2020 0226   PCO2ART 77.5 (HH) 12/08/2020 0226   PO2ART 74 (L) 12/08/2020 0226   HCO3 35.7 (H) 12/08/2020 0226   TCO2 38 (H) 12/08/2020 0226   O2SAT 92.0 12/08/2020 0226     Coagulation Profile: No results for input(s): INR, PROTIME in the last 168 hours.  Cardiac Enzymes: No results for input(s): CKTOTAL, CKMB, CKMBINDEX, TROPONINI in the last 168 hours.  HbA1C: Hgb A1c MFr Bld  Date/Time Value Ref Range Status  12/08/2020 05:01 AM 6.7 (H) 4.8 - 5.6 % Final    Comment:    (NOTE) Pre diabetes:          5.7%-6.4%  Diabetes:              >  6.4%  Glycemic control for   <7.0% adults with diabetes     CBG: Recent Labs  Lab 12/08/20 1506 12/08/20 2038 12/09/20 0109 12/09/20 0405 12/09/20 0721  GLUCAP 132* 192* 160* 188* 164*    Review of Systems:   Unable to obtain secondary to  intubated  Past Medical History:  He,  has a past medical history of Anxiety, COPD (chronic obstructive pulmonary disease) ([place]), GERD (gastroesophageal reflux disease), Hematuria, Hyperlipidemia, Hypertension, Insomnia, Nephrolithiasis, and Tobacco dependence.   Surgical History:   Past Surgical History:  Procedure Laterality Date  . LITHOTRIPSY       Social History:   reports that he quit smoking about 12 years ago. His smoking use included cigarettes. He has a 35.00 pack-year smoking history. He has never used smokeless tobacco.   Family History:  His family history includes Heart disease in his father; Leukemia in his brother; Stroke in his mother.   Allergies Allergies  Allergen Reactions  . Codeine Itching    "jittery"  . Prozac [Fluoxetine Hcl]     confusion     Home Medications  Prior to Admission medications   Medication Sig Start Date End Date Taking? Authorizing Provider  albuterol (PROVENTIL) (2.5 MG/3ML) 0.083% nebulizer solution USE 1 VIAL IN NEBULIZER AS NEEDED FOR WHEEZING/SHORTNESS OF BREATH 10/19/20   Brand Males, MD  albuterol (VENTOLIN HFA) 108 (90 Base) MCG/ACT inhaler Inhale 2 puffs into the lungs every 4 (four) hours as needed for wheezing or shortness of breath. 12/08/19   Brand Males, MD  bisoprolol (ZEBETA) 5 MG tablet Take 5 mg by mouth daily. Two times daily    [provider]  budesonide (PULMICORT) 0.5 MG/2ML nebulizer solution TAKE 2 ML BY NEBULIZATION 2 TIMES DAILY. 06/17/20   Brand Males, MD  Cinnamon 500 MG capsule Take 500 mg by mouth daily.    [provider]  cloNIDine (CATAPRES) 0.2 MG tablet Take 0.2 mg by mouth 3 (three) times daily.    [provider]  fluticasone (FLONASE) 50 MCG/ACT nasal spray SPRAY 2 SPRAYS INTO EACH NOSTRIL EVERY DAY 10/19/20   Brand Males, MD  ipratropium (ATROVENT) 0.02 % nebulizer solution INHALE 1 VIAL IN NEBULIZER 4 TIMES A DAY 11/09/20   Brand Males, MD   losartan-hydrochlorothiazide (HYZAAR) 50-12.5 MG per tablet Take 1 tablet by mouth daily.    [provider]  montelukast (SINGULAIR) 10 MG tablet Take 10 mg by mouth at bedtime.    [provider]  nystatin (MYCOSTATIN) 100000 UNIT/ML suspension Take 5 mLs (500,000 Units total) by mouth 4 (four) times daily. 01/07/18   Brand Males, MD  nystatin (MYCOSTATIN) 100000 UNIT/ML suspension Take 5 mLs (500,000 Units total) by mouth 4 (four) times daily. 02/03/19   Brand Males, MD  omeprazole (PRILOSEC) 20 MG capsule Take 2 capsules (40 mg total) by mouth daily. For GERD. 07/09/12   Erick Colace, NP  sodium chloride (OCEAN) 0.65 % SOLN nasal spray Place 2 sprays into both nostrils as needed for congestion. Patient taking differently: Place 2 sprays into both nostrils daily as needed for congestion.  05/05/14   Erick Colace, NP  valsartan-hydrochlorothiazide (DIOVAN-HCT) 160-12.5 MG tablet 1 TABLET ONCE A DAY (REPLACES LOSARTAN/HYDROCHLOROTHIAZIDE) ORALLY 90 05/13/19   [provider]    Dr. Jose Persia Internal Medicine PGY-2  12/09/2020, 7:52 AM

## 2020-12-09 NOTE — Progress Notes (Addendum)
HEMATOLOGY-ONCOLOGY PROGRESS NOTE  SUBJECTIVE: Intubated. Sedated. No family at the bedside.   REVIEW OF SYSTEMS:   Unable to obtain secondary to sedation.   I have reviewed the past medical history, past surgical history, social history and family history with the patient and they are unchanged from previous note.   PHYSICAL EXAMINATION: ECOG PERFORMANCE STATUS: 4 - Bedbound  Vitals:   12/09/20 1145 12/09/20 1200  BP: (!) 154/81 (!) 168/95  Pulse: 99 (!) 119  Resp: 13 17  Temp:  98.4 F (36.9 C)  SpO2: 93% 92%   Filed Weights   12/08/20 0043 12/08/20 0442 12/09/20 0500  Weight: 125 kg 118.5 kg 122 kg    Intake/Output from previous day: 01/13 0701 - 01/14 0700 In: 5910.1 [P.O.:800; I.V.:4112.5; NG/GT:244.2; IV Piggyback:753.4] Out: 1925 [Urine:1925]  GENERAL:chronically ill appearing male, no distress SKIN: skin color, texture, turgor are normal, no rashes or significant lesions EYES: PERRL, no scleral icterus OROPHARYNX: ETT in place. LUNGS: rales, scattered wheezes HEART: regular rate & rhythm and no murmurs, 1+ LE edema ABDOMEN: positive bowel sounds, soft NEURO: Sedated.  LABORATORY DATA:  I have reviewed the data as listed CMP Latest Ref Rng & Units 12/09/2020 12/08/2020 12/08/2020  Glucose 70 - 99 mg/dL 185(H) 183(H) 174(H)  BUN 8 - 23 mg/dL 41(H) 38(H) 25(H)  Creatinine 0.61 - 1.24 mg/dL 1.40(H) 1.74(H) 1.46(H)  Sodium 135 - 145 mmol/L 135 135 133(L)  Potassium 3.5 - 5.1 mmol/L 6.2(H) 6.2(H) 5.5(H)  Chloride 98 - 111 mmol/L 98 94(L) 91(L)  CO2 22 - 32 mmol/L 27 28 26   Calcium 8.9 - 10.3 mg/dL 8.0(L) 8.2(L) 8.2(L)  Total Protein 6.5 - 8.1 g/dL - - -  Total Bilirubin 0.3 - 1.2 mg/dL - - -  Alkaline Phos 38 - 126 U/L - - -  AST 15 - 41 U/L - - -  ALT 17 - 63 U/L - - -    Lab Results  Component Value Date   WBC 216.1 (HH) 12/09/2020   HGB 10.8 (L) 12/09/2020   HCT 35.6 (L) 12/09/2020   MCV 90.1 12/09/2020   PLT PLATELET CLUMPS NOTED ON SMEAR, UNABLE TO  ESTIMATE 12/09/2020   NEUTROABS 13.3 (H) 12/09/2020    DG Chest Portable 1 View  Result Date: 12/08/2020 CLINICAL DATA:  Dyspnea, intubation EXAM: PORTABLE CHEST - 1 VIEW; PORTABLE ABDOMEN - 1 VIEW COMPARISON:  Radiograph 12/17/2016, 07/15/2008, CT 05/05/2008 FINDINGS: *Endotracheal tube tip terminates in the mid trachea, 5.4 cm from the carina. *A transesophageal tube tip and side port distal to the GE junction, terminating at the level of the distal gastric body. *Telemetry leads and external support devices overlie the chest. Diffuse interstitial opacities throughout both lungs with some additional patchy areas of peripheral consolidation. Pulmonary vascular congestion with fissural and septal thickening is noted as well. No pneumothorax or effusion. Prominence of the cardiac silhouette though may be related to portable technique. No worrisome bowel gas pattern is seen in the included portions of the abdomen. No subdiaphragmatic free air. No suspicious abdominal calcifications. Degenerative changes are present in the spine and shoulders. IMPRESSION: 1. Endotracheal tube tip terminates in the mid trachea, 5.4 cm from the carina. 2. A transesophageal tube tip and side port distal to the GE junction terminating at the distal gastric body. 3. Diffuse interstitial opacities throughout both lungs with some additional patchy areas of peripheral consolidation. Findings could reflect atypical infection (including COVID 19) and/or developing pulmonary edema. Electronically Signed   By: Lovena Le  M.D.   On: 12/08/2020 01:04   DG Abd Portable 1 View  Result Date: 12/08/2020 CLINICAL DATA:  Dyspnea, intubation EXAM: PORTABLE CHEST - 1 VIEW; PORTABLE ABDOMEN - 1 VIEW COMPARISON:  Radiograph 12/17/2016, 07/15/2008, CT 05/05/2008 FINDINGS: *Endotracheal tube tip terminates in the mid trachea, 5.4 cm from the carina. *A transesophageal tube tip and side port distal to the GE junction, terminating at the level of the  distal gastric body. *Telemetry leads and external support devices overlie the chest. Diffuse interstitial opacities throughout both lungs with some additional patchy areas of peripheral consolidation. Pulmonary vascular congestion with fissural and septal thickening is noted as well. No pneumothorax or effusion. Prominence of the cardiac silhouette though may be related to portable technique. No worrisome bowel gas pattern is seen in the included portions of the abdomen. No subdiaphragmatic free air. No suspicious abdominal calcifications. Degenerative changes are present in the spine and shoulders. IMPRESSION: 1. Endotracheal tube tip terminates in the mid trachea, 5.4 cm from the carina. 2. A transesophageal tube tip and side port distal to the GE junction terminating at the distal gastric body. 3. Diffuse interstitial opacities throughout both lungs with some additional patchy areas of peripheral consolidation. Findings could reflect atypical infection (including COVID 19) and/or developing pulmonary edema. Electronically Signed   By: Lovena Le M.D.   On: 12/08/2020 01:04   ECHOCARDIOGRAM COMPLETE  Result Date: 12/08/2020    ECHOCARDIOGRAM REPORT   Patient Name:   Isaac [name] Date of Exam: 12/08/2020 Medical Rec #:  [id number]        Height:       70.0 in Accession #:    [id number]       Weight:       261.2 lb Date of Birth:  1956/01/28        BSA:          2.338 m Patient Age:    65 years         BP:           171/93 mmHg Patient Gender: M                HR:           105 bpm. Exam Location:  Inpatient Procedure: 2D Echo, Cardiac Doppler and Color Doppler Indications:     Dyspnea R06.00  History:         Patient has prior history of Echocardiogram examinations, most                  recent 04/24/2014. COPD; Risk Factors:Hypertension,                  Dyslipidemia, Current Smoker and GERD.  Sonographer:     Tiffany Dance Referring Phys:  [id number] Francesca Jewett Diagnosing Phys: Oswaldo Milian MD   Sonographer Comments: Echo performed with patient supine and on artificial respirator. IMPRESSIONS  1. Left ventricular ejection fraction, by estimation, is 60 to 65%. The left ventricle has normal function. The left ventricle has no regional wall motion abnormalities. There is moderate left ventricular hypertrophy. Left ventricular diastolic parameters are indeterminate.  2. Right ventricular systolic function is normal. The right ventricular size is normal. Tricuspid regurgitation signal is inadequate for assessing PA pressure.  3. The mitral valve is grossly normal. No evidence of mitral valve regurgitation.  4. The aortic valve was not well visualized. Aortic valve regurgitation is not visualized. No aortic stenosis is present.  5. Liver cyst measuring  5.4cm x 4.1cm FINDINGS  Left Ventricle: Left ventricular ejection fraction, by estimation, is 60 to 65%. The left ventricle has normal function. The left ventricle has no regional wall motion abnormalities. The left ventricular internal cavity size was normal in size. There is  moderate left ventricular hypertrophy. Left ventricular diastolic parameters are indeterminate. Right Ventricle: The right ventricular size is normal. No increase in right ventricular wall thickness. Right ventricular systolic function is normal. Tricuspid regurgitation signal is inadequate for assessing PA pressure. Left Atrium: Left atrial size was normal in size. Right Atrium: Right atrial size was normal in size. Pericardium: Trivial pericardial effusion is present. Presence of pericardial fat pad. Mitral Valve: The mitral valve is grossly normal. No evidence of mitral valve regurgitation. Tricuspid Valve: The tricuspid valve is grossly normal. Tricuspid valve regurgitation is trivial. Aortic Valve: The aortic valve was not well visualized. Aortic valve regurgitation is not visualized. No aortic stenosis is present. Pulmonic Valve: The pulmonic valve was not well visualized. Pulmonic  valve regurgitation is not visualized. Aorta: The aortic root is normal in size and structure. IAS/Shunts: No atrial level shunt detected by color flow Doppler.  LEFT VENTRICLE PLAX 2D LVIDd:         4.30 cm      Diastology LVIDs:         2.90 cm      LV e' medial:   10.40 cm/s LV PW:         1.20 cm      LV E/e' medial: 14.5 LV IVS:        1.10 cm LVOT diam:     2.20 cm LV SV:         76 LV SV Index:   33 LVOT Area:     3.80 cm  LV Volumes (MOD) LV vol d, MOD A4C: 123.0 ml LV vol s, MOD A4C: 51.1 ml LV SV MOD A4C:     123.0 ml RIGHT VENTRICLE             IVC RV Basal diam:  3.10 cm     IVC diam: 2.80 cm RV Mid diam:    2.40 cm RV S prime:     10.40 cm/s TAPSE (M-mode): 2.6 cm LEFT ATRIUM             Index       RIGHT ATRIUM           Index LA diam:        3.20 cm 1.37 cm/m  RA Area:     12.00 cm LA Vol (A2C):   39.7 ml 16.98 ml/m RA Volume:   25.20 ml  10.78 ml/m LA Vol (A4C):   24.6 ml 10.52 ml/m LA Biplane Vol: 32.8 ml 14.03 ml/m  AORTIC VALVE LVOT Vmax:   112.00 cm/s LVOT Vmean:  75.200 cm/s LVOT VTI:    0.200 m  AORTA Ao Root diam: 3.10 cm MITRAL VALVE MV Area (PHT): 5.04 cm     SHUNTS MV Decel Time: 151 msec     Systemic VTI:  0.20 m MV E velocity: 150.50 cm/s  Systemic Diam: 2.20 cm Oswaldo Milian MD Electronically signed by Oswaldo Milian MD Signature Date/Time: 12/08/2020/10:38:01 AM    Final (Updated)     ASSESSMENT AND PLAN: 65 year old man:   1.    CLL diagnosed in January 2019: He presented with leukocytosis and lymphocytosis. He has been on active surveillance since the time of diagnosis. The patient has not been seen  in our office since 2019.  WBC this admission was as high as 299,000.  Peripheral blood smear showed predominantly lymphocytosis consistent with his lymphoproliferative disorder.  Repeat flow cytometry has been sent.  He does not have any clear evidence of transformation to an acute leukemic process.  I do not recommend any specific treatment for his CLL given his  overall clinical status.  Recommend continue supportive care with the potential to treat his CLL when he recovers from his acute respiratory episode.  Overall prognosis is guarded given his respiratory failure and tumor lysis syndrome associated with CLL.  2.  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  Uric acid was up to 15.4 yesterday and he has received 4 doses of rasburicase.  Uric acid level has normalized. Phosphorous remains elevated and calcium low. Monitor for signs of tumor lysis and administer rasburicase as needed.  Continue allopurinol.   3. Acute on chronic hypoxic respiratory failure.  Currently on the vent.  Management per critical care.    LOS: 1 day   Mikey Bussing, DNP, AGPCNP-BC, AOCNP 12/09/20  Patient seen and examined personally.  Laboratory data reviewed and discussed with the patient and the family members that were present bedside.  The differential diagnosis of his findings were reviewed again and discussed with the patient and his family.  CLL remains the most likely etiology for his lymphoproliferative disorder although other considerations such as mantle cell lymphoma are considered less likely.  He is FISH prognostic panel is currently pending.  Management options at this time were discussed including systemic chemotherapy and oral targeted therapy.  These are not options at this time and will be deferred till he is recovered from this acute illness and respiratory failure.   I agree with the current management of his tumor lysis syndrome with supportive care including hydration, allopurinol and rasburicase as needed.  All questions were answered today to the family satisfaction.  35  minutes were dedicated to this visit. T50% of the time was spent face-to-face with with time spent on reviewing laboratory data, imaging studies, discussing treatment options, discussing differential diagnosis and answering questions regarding future plan.

## 2020-12-09 NOTE — Progress Notes (Signed)
   12/09/20 0805  Adult Ventilator Settings  Vent Type Servo i  Humidity HME  Vent Mode PRVC  Vt Set 580 mL  Set Rate 28 bmp  FiO2 (%) 40 %  I Time 0.7 Sec(s)  PEEP 5 cmH20  Daily Weaning Assessment  Patient response Failed SBT terminated  Reason SBT Terminated Excessive agitation, marked accessory muscle use, abdominal paradox, or diaphoresis noted;HR > 130 beats/min or sustained change in HR of 20%

## 2020-12-10 DIAGNOSIS — J441 Chronic obstructive pulmonary disease with (acute) exacerbation: Secondary | ICD-10-CM | POA: Diagnosis not present

## 2020-12-10 DIAGNOSIS — C9110 Chronic lymphocytic leukemia of B-cell type not having achieved remission: Secondary | ICD-10-CM | POA: Diagnosis not present

## 2020-12-10 DIAGNOSIS — J9602 Acute respiratory failure with hypercapnia: Secondary | ICD-10-CM | POA: Diagnosis not present

## 2020-12-10 LAB — BASIC METABOLIC PANEL
Anion gap: 9 (ref 5–15)
Anion gap: 11 (ref 5–15)
BUN: 45 mg/dL — ABNORMAL HIGH (ref 8–23)
BUN: 49 mg/dL — ABNORMAL HIGH (ref 8–23)
CO2: 30 mmol/L (ref 22–32)
CO2: 29 mmol/L (ref 22–32)
Calcium: 8.1 mg/dL — ABNORMAL LOW (ref 8.9–10.3)
Calcium: 8.2 mg/dL — ABNORMAL LOW (ref 8.9–10.3)
Chloride: 98 mmol/L (ref 98–111)
Chloride: 99 mmol/L (ref 98–111)
Creatinine, Ser: 1.18 mg/dL (ref 0.61–1.24)
Creatinine, Ser: 1.22 mg/dL (ref 0.61–1.24)
GFR, Estimated: >60 mL/min (ref >60)
GFR, Estimated: >60 mL/min (ref >60)
Glucose, Bld: 212 mg/dL — ABNORMAL HIGH (ref 70–99)
Glucose, Bld: 214 mg/dL — ABNORMAL HIGH (ref 70–99)
Potassium: 5.7 mmol/L — ABNORMAL HIGH (ref 3.5–5.1)
Potassium: 6.1 mmol/L — ABNORMAL HIGH (ref 3.5–5.1)
Sodium: 137 mmol/L (ref 135–145)
Sodium: 139 mmol/L (ref 135–145)

## 2020-12-10 LAB — CBC WITH DIFFERENTIAL/PLATELET
Abs Immature Granulocytes: 0.91 10*3/uL — ABNORMAL HIGH (ref 0.00–0.07)
Basophils Absolute: 0.5 10*3/uL — ABNORMAL HIGH (ref 0.0–0.1)
Basophils Relative: 0 %
Eosinophils Absolute: 0 10*3/uL (ref 0.0–0.5)
Eosinophils Relative: 0 %
HCT: 36 % — ABNORMAL LOW (ref 39.0–52.0)
Hemoglobin: 10.4 g/dL — ABNORMAL LOW (ref 13.0–17.0)
Immature Granulocytes: 1 %
Lymphocytes Relative: 83 %
Lymphs Abs: 162.5 10*3/uL — ABNORMAL HIGH (ref 0.7–4.0)
MCH: 26.5 pg (ref 26.0–34.0)
MCHC: 28.9 g/dL — ABNORMAL LOW (ref 30.0–36.0)
MCV: 91.8 fL (ref 80.0–100.0)
Monocytes Absolute: 19.7 10*3/uL — ABNORMAL HIGH (ref 0.1–1.0)
Monocytes Relative: 10 %
Neutro Abs: 10.9 10*3/uL — ABNORMAL HIGH (ref 1.7–7.7)
Neutrophils Relative %: 6 %
Platelets: 290 10*3/uL (ref 150–400)
RBC: 3.92 MIL/uL — ABNORMAL LOW (ref 4.22–5.81)
RDW: 14 % (ref 11.5–15.5)
WBC: 194.4 10*3/uL (ref 4.0–10.5)
nRBC: 0 % (ref 0.0–0.2)

## 2020-12-10 LAB — GLUCOSE, CAPILLARY
Glucose-Capillary: 174 mg/dL — ABNORMAL HIGH (ref 70–99)
Glucose-Capillary: 183 mg/dL — ABNORMAL HIGH (ref 70–99)
Glucose-Capillary: 190 mg/dL — ABNORMAL HIGH (ref 70–99)
Glucose-Capillary: 195 mg/dL — ABNORMAL HIGH (ref 70–99)
Glucose-Capillary: 202 mg/dL — ABNORMAL HIGH (ref 70–99)
Glucose-Capillary: 227 mg/dL — ABNORMAL HIGH (ref 70–99)
Glucose-Capillary: 243 mg/dL — ABNORMAL HIGH (ref 70–99)

## 2020-12-10 LAB — PHOSPHORUS
Phosphorus: 6 mg/dL — ABNORMAL HIGH (ref 2.5–4.6)
Phosphorus: 7.3 mg/dL — ABNORMAL HIGH (ref 2.5–4.6)

## 2020-12-10 LAB — MAGNESIUM
Magnesium: 3.1 mg/dL — ABNORMAL HIGH (ref 1.7–2.4)
Magnesium: 3 mg/dL — ABNORMAL HIGH (ref 1.7–2.4)

## 2020-12-10 MED ORDER — INSULIN ASPART 100 UNIT/ML ~~LOC~~ SOLN
0.0000 [IU] | SUBCUTANEOUS | Status: DC
  Administered 2020-12-10 (×2): 4 [IU] via SUBCUTANEOUS
  Administered 2020-12-10: 7 [IU] via SUBCUTANEOUS
  Administered 2020-12-10: 4 [IU] via SUBCUTANEOUS
  Administered 2020-12-11 (×2): 7 [IU] via SUBCUTANEOUS
  Administered 2020-12-11 – 2020-12-12 (×9): 4 [IU] via SUBCUTANEOUS
  Administered 2020-12-12: 7 [IU] via SUBCUTANEOUS
  Administered 2020-12-13 (×3): 4 [IU] via SUBCUTANEOUS
  Administered 2020-12-13: 7 [IU] via SUBCUTANEOUS
  Administered 2020-12-13 – 2020-12-14 (×3): 4 [IU] via SUBCUTANEOUS
  Administered 2020-12-14 (×4): 3 [IU] via SUBCUTANEOUS
  Administered 2020-12-14: 7 [IU] via SUBCUTANEOUS
  Administered 2020-12-15: 3 [IU] via SUBCUTANEOUS
  Administered 2020-12-15: 4 [IU] via SUBCUTANEOUS
  Administered 2020-12-15 (×3): 3 [IU] via SUBCUTANEOUS
  Administered 2020-12-16: 4 [IU] via SUBCUTANEOUS
  Administered 2020-12-16 (×3): 3 [IU] via SUBCUTANEOUS
  Administered 2020-12-17 (×2): 4 [IU] via SUBCUTANEOUS
  Administered 2020-12-17 – 2020-12-18 (×4): 3 [IU] via SUBCUTANEOUS
  Administered 2020-12-18 – 2020-12-19 (×3): 4 [IU] via SUBCUTANEOUS
  Administered 2020-12-19 (×2): 3 [IU] via SUBCUTANEOUS
  Administered 2020-12-19: 4 [IU] via SUBCUTANEOUS

## 2020-12-10 MED ORDER — PREDNISONE 20 MG PO TABS
40.0000 mg | ORAL_TABLET | Freq: Every day | ORAL | Status: DC
  Administered 2020-12-11 – 2020-12-14 (×4): 40 mg
  Filled 2020-12-10 (×4): qty 2

## 2020-12-10 MED ORDER — DEXMEDETOMIDINE HCL IN NACL 400 MCG/100ML IV SOLN
0.4000 ug/kg/h | INTRAVENOUS | Status: AC
  Administered 2020-12-10: 1 ug/kg/h via INTRAVENOUS
  Administered 2020-12-10: 0.8 ug/kg/h via INTRAVENOUS
  Administered 2020-12-10: 2 ug/kg/h via INTRAVENOUS
  Administered 2020-12-10: 2.4 ug/kg/h via INTRAVENOUS
  Administered 2020-12-11: 1.8 ug/kg/h via INTRAVENOUS
  Administered 2020-12-11: 1.6 ug/kg/h via INTRAVENOUS
  Administered 2020-12-11: 1 ug/kg/h via INTRAVENOUS
  Administered 2020-12-11: 1.1 ug/kg/h via INTRAVENOUS
  Administered 2020-12-11: 1 ug/kg/h via INTRAVENOUS
  Administered 2020-12-11: 0.4 ug/kg/h via INTRAVENOUS
  Administered 2020-12-11: 1.8 ug/kg/h via INTRAVENOUS
  Administered 2020-12-12 (×2): 0.4 ug/kg/h via INTRAVENOUS
  Administered 2020-12-13 (×2): 0.6 ug/kg/h via INTRAVENOUS
  Filled 2020-12-10 (×15): qty 100

## 2020-12-10 MED ORDER — ENOXAPARIN SODIUM 60 MG/0.6ML ~~LOC~~ SOLN
60.0000 mg | SUBCUTANEOUS | Status: DC
  Administered 2020-12-11 – 2020-12-13 (×3): 60 mg via SUBCUTANEOUS
  Filled 2020-12-10 (×3): qty 0.6

## 2020-12-10 MED ORDER — DOCUSATE SODIUM 50 MG/5ML PO LIQD
100.0000 mg | Freq: Two times a day (BID) | ORAL | Status: DC
  Administered 2020-12-10: 100 mg
  Filled 2020-12-10 (×3): qty 10

## 2020-12-10 MED ORDER — POLYETHYLENE GLYCOL 3350 17 G PO PACK
17.0000 g | PACK | Freq: Every day | ORAL | Status: DC
  Administered 2020-12-12 – 2020-12-14 (×2): 17 g
  Filled 2020-12-10 (×3): qty 1

## 2020-12-10 NOTE — Progress Notes (Signed)
[facility] Progress Note Patient Name: Isaac [name] DOB: January 18, 1956 MRN: [id number]   Date of Service  12/10/2020  HPI/Events of Note  K+ improving on Lokelma. Latest K+ is 5.7, down from 5.9.  eICU Interventions  Continue Lokelma as scheduled.        [name] Angenette Daily 12/10/2020, 3:53 AM

## 2020-12-10 NOTE — Progress Notes (Signed)
NAME:  Isaac [name], MRN:  [id number], DOB:  September 21, 1956, LOS: 2 ADMISSION DATE:  12/08/2020, CONSULTATION DATE:  12/10/20 REFERRING MD:  EDP, CHIEF COMPLAINT:  Shortness of breath   Brief History:  65 y.o. y/o M with PMH of COPD on 4L home O2, HTN, HL, prior tobacco use who was brought to the ED by EMS for shortness of breath.  Just prior to arriving to the ED, pt became unresponsive and was intubated on arrival.  ABG with severe Hypercapnic respiratory failure and CXR with possible infiltrates  History of Present Illness:  Isaac [name] is a 65 y.o. M with PMH of  COPD on 4L home O2 followed by Dr. Chase Caller, HTN, HL, prior tobacco use who called EMS for worsening shortness of breath.  En route to the ED, he became unresponsive and required intubation on arrival.   Further history obtained from patient's wife, she states that he has been compliant with wearing home O2, does not wear CPAP or Bipap and that his dyspnea started with mild cough 2-3 days ago.  No known fever, but felt warm.  He is vaccinated, but not boosted for Covid-19.  In the ED, workup was significant for hypercapnic respiratory failure with ABG 7.14/101/190/33,  WBC 299k, potassium 6.0 and possibly patchy infiltrates on CXR.  Covid-19 and influenza are negative.  He has had an elevated WBC in the past and was referred to Oncology, he was not able to make his last appointment and has not scheduled a follow up as he was feeling ok.  PCCM consulted for admission  Past Medical History:   has a past medical history of Anxiety, COPD (chronic obstructive pulmonary disease) ([place]), GERD (gastroesophageal reflux disease), Hematuria, Hyperlipidemia, Hypertension, Insomnia, Nephrolithiasis, and Tobacco dependence.  Significant Hospital Events:  1/13 Intubated in ED, PCCM admit  Consults:  Oncology  Procedures:  1/13 ETT  Significant Diagnostic Tests:  1/13 CXR >> Diffuse interstitial opacities throughout both lungs with some  additional patchy areas of peripheral consolidation. Findings could reflect atypical infection (including COVID 19) and/or developing pulmonary edema. LE doppler US 1/14 >> negative for DVT  Micro Data:  1/13 Covid-19, Flu >> Negative 1/13 Urine Culture >> negative   Antimicrobials:  Ceftriaxone 1/13 >> Azithromycin 1/13 >>   Interim History / Subjective:   Sedated but wakes easily, has not done any PSV yet today  Objective   Blood pressure (!) 148/79, pulse (!) 122, temperature 98.6 F (37 C), temperature source Oral, resp. rate 16, height 5\' 10"  (1.778 m), weight 122.9 kg, SpO2 90 %.    Vent Mode: PRVC FiO2 (%):  [40 %] 40 % Set Rate:  [16 bmp] 16 bmp Vt Set:  [580 mL] 580 mL PEEP:  [5 cmH20] 5 cmH20 Plateau Pressure:  [12 cmH20-30 cmH20] 17 cmH20   Intake/Output Summary (Last 24 hours) at 12/10/2020 1021 Last data filed at 12/10/2020 0800 Gross per 24 hour  Intake 2662.26 ml  Output 1410 ml  Net 1252.26 ml   Filed Weights   12/08/20 0442 12/09/20 0500 12/10/20 0500  Weight: 118.5 kg 122 kg 122.9 kg   Gen: Ill-appearing obese man, ventilated HEENT: Pupils equal, ET tube in place, oropharynx otherwise clear Lungs: Distant, small volumes, no crackles or wheezing, paradoxical abdominal movement on exhalation CV: Distant, regular, no murmur Abdomen: Paradoxical movement as above, obese, somewhat tympanitic, positive bowel sounds Extremities no significant edema Neuro: He is awake, eyes open, tracks, nods to questions even on fairly high-dose fentanyl and  propofol  Resolved Hospital Problem list   N/A  Assessment & Plan:   # Acute on Chronic Hypoxic and Hypercapnic Respiratory Failure: Severe underlying COPD with last PFTs in 2015 showing a FEV1/FVC of 29.  -PRVC, transition to SBT and PSV when able 1/15.  Degree of sedation may be an issue. -VAP invention order set -Pulmonary hygiene -Azithromycin and ceftriaxone day 4/5 on 1/15, place stop date -Convert Solu-Medrol  to prednisone 1/15 and taper -Scheduled DuoNeb -Work to minimize sedation, may benefit from change propofol to Precedex to facilitate ventilator weaning  # Chronic Lymphocytic Leukemia:  On chart review, looks like patient did follow-up with Dr. Alen Blew in 2019 and was told at that time he had confirmed CLL.   -Appreciate oncology/hematology assistance -Flow cytometry pending  # Acute Kidney Injury: Creatinine improving with adequate urine output # Tumor lysis syndrome: Has received rasburicasex4.  -Plan to follow uric acid on 1/16 to help decide whether any further rasburicase is indicated -Continue Lokelma scheduled 3 times daily -Follow BMP, phosphorus, magnesium levels -Allopurinol ordered 1/15 -IV fluids 75 cc/h -G6P level pending  # Hypotension: Possibly in the setting of sedation, resolved  # Hx of Hypertension  -Improved.  Continue to hold home bisoprolol, losartan, HCTZ, Diovan, clonidine.  # Unilateral edema:  -No evidence DVT Doppler ultrasound 1/14  #Constipation -Initiate bowel regimen 1/15  #Hyperglycemia, no history DM, likely due to steroids -Adjust sliding scale insulin  Best practice (evaluated daily)  Diet: Tube Feeds  Pain/Anxiety/Delirium protocol (if indicated): Propofol, Fentanyl VAP protocol (if indicated): HOB 30 degrees, suction prn DVT prophylaxis: Lovenox GI prophylaxis: Protonix Glucose control: SSI Mobility: bed rest Disposition: ICU Family Communication: Patient's wife updated at bedside 1/15  Goals of Care:  Last date of multidisciplinary goals of care discussion:1/13 Family and staff present:  Summary of discussion:  Follow up goals of care discussion due: 1/20 Code Status: Full code  Labs   CBC: Recent Labs  Lab 12/08/20 0100 12/08/20 0226 12/08/20 0501 12/08/20 1046 12/09/20 0445 12/10/20 0129  WBC 299.1*  --  172.3*  --  216.1* 194.4*  NEUTROABS 8.9*  --   --   --  13.3* 10.9*  HGB 11.5* 14.3 10.9* 11.5* 10.8* 10.4*  HCT  42.5 42.0 37.4*  --  35.6* 36.0*  MCV 92.0  --  89.7  --  90.1 91.8  PLT 364  --  305  --  PLATELET CLUMPS NOTED ON SMEAR, UNABLE TO ESTIMATE [id number]    Basic Metabolic Panel: Recent Labs  Lab 12/08/20 0501 12/08/20 1046 12/08/20 1653 12/09/20 0445 12/09/20 0648 12/09/20 1819 12/10/20 0129  NA 133*  --  135  --  135 138 137  K 5.5*  --  6.2*  --  6.2* 5.9* 5.7*  CL 91*  --  94*  --  98 98 98  CO2 26  --  28  --  27 30 30   GLUCOSE 174*  --  183*  --  185* 197* 212*  BUN 25*  --  38*  --  41* 45* 45*  CREATININE 1.46*  --  1.74*  --  1.40* 1.29* 1.18  CALCIUM 8.2*  --  8.2*  --  8.0* 8.1* 8.1*  MG 2.7* 2.8* 2.8* 2.2  --  3.1* 3.1*  PHOS 6.3* 6.6* 6.7* 7.0*  --  6.4* 6.0*   GFR: Estimated Creatinine Clearance: 82.1 mL/min (by C-G formula based on SCr of 1.18 mg/dL). Recent Labs  Lab 12/08/20 0100 12/08/20 0501 12/09/20  0445 12/10/20 0129  WBC 299.1* 172.3* 216.1* 194.4*    Liver Function Tests: No results for input(s): AST, ALT, ALKPHOS, BILITOT, PROT, ALBUMIN in the last 168 hours. No results for input(s): LIPASE, AMYLASE in the last 168 hours. No results for input(s): AMMONIA in the last 168 hours.  ABG    Component Value Date/Time   PHART 7.268 (L) 12/08/2020 0226   PCO2ART 77.5 (HH) 12/08/2020 0226   PO2ART 74 (L) 12/08/2020 0226   HCO3 35.7 (H) 12/08/2020 0226   TCO2 38 (H) 12/08/2020 0226   O2SAT 92.0 12/08/2020 0226     Coagulation Profile: No results for input(s): INR, PROTIME in the last 168 hours.  Cardiac Enzymes: No results for input(s): CKTOTAL, CKMB, CKMBINDEX, TROPONINI in the last 168 hours.  HbA1C: Hgb A1c MFr Bld  Date/Time Value Ref Range Status  12/08/2020 05:01 AM 6.7 (H) 4.8 - 5.6 % Final    Comment:    (NOTE) Pre diabetes:          5.7%-6.4%  Diabetes:              >6.4%  Glycemic control for   <7.0% adults with diabetes     CBG: Recent Labs  Lab 12/09/20 1528 12/09/20 1946 12/10/20 0014 12/10/20 0327 12/10/20 0742   GLUCAP 199* 193* 190* 243* 227*    Independent CC time 33 minutes  Baltazar Apo, MD, PhD 12/10/2020, 10:34 AM [facility] [phone] or if no answer [phone]

## 2020-12-11 ENCOUNTER — Inpatient Hospital Stay (HOSPITAL_COMMUNITY): Payer: BC Managed Care – PPO

## 2020-12-11 DIAGNOSIS — C9110 Chronic lymphocytic leukemia of B-cell type not having achieved remission: Secondary | ICD-10-CM | POA: Diagnosis not present

## 2020-12-11 DIAGNOSIS — J9602 Acute respiratory failure with hypercapnia: Secondary | ICD-10-CM | POA: Diagnosis not present

## 2020-12-11 LAB — CBC WITH DIFFERENTIAL/PLATELET
Abs Immature Granulocytes: 0.9 10*3/uL — ABNORMAL HIGH (ref 0.00–0.07)
Basophils Absolute: 0.5 10*3/uL — ABNORMAL HIGH (ref 0.0–0.1)
Basophils Relative: 0 %
Eosinophils Absolute: 0 10*3/uL (ref 0.0–0.5)
Eosinophils Relative: 0 %
HCT: 35.4 % — ABNORMAL LOW (ref 39.0–52.0)
Hemoglobin: 10.3 g/dL — ABNORMAL LOW (ref 13.0–17.0)
Immature Granulocytes: 0 %
Lymphocytes Relative: 86 %
Lymphs Abs: 178.6 10*3/uL — ABNORMAL HIGH (ref 0.7–4.0)
MCH: 27.2 pg (ref 26.0–34.0)
MCHC: 29.1 g/dL — ABNORMAL LOW (ref 30.0–36.0)
MCV: 93.4 fL (ref 80.0–100.0)
Monocytes Absolute: 17.6 10*3/uL — ABNORMAL HIGH (ref 0.1–1.0)
Monocytes Relative: 9 %
Neutro Abs: 9.4 10*3/uL — ABNORMAL HIGH (ref 1.7–7.7)
Neutrophils Relative %: 5 %
Platelets: 287 10*3/uL (ref 150–400)
RBC: 3.79 MIL/uL — ABNORMAL LOW (ref 4.22–5.81)
RDW: 14.3 % (ref 11.5–15.5)
WBC: 206.9 10*3/uL (ref 4.0–10.5)
nRBC: 0 % (ref 0.0–0.2)

## 2020-12-11 LAB — POCT I-STAT 7, (LYTES, BLD GAS, ICA,H+H)
Acid-Base Excess: 9 mmol/L — ABNORMAL HIGH (ref 0.0–2.0)
Bicarbonate: 36.9 mmol/L — ABNORMAL HIGH (ref 20.0–28.0)
Calcium, Ion: 1.18 mmol/L (ref 1.15–1.40)
HCT: 36 % — ABNORMAL LOW (ref 39.0–52.0)
Hemoglobin: 12.2 g/dL — ABNORMAL LOW (ref 13.0–17.0)
O2 Saturation: 94 %
Patient temperature: 98.6
Potassium: 5.6 mmol/L — ABNORMAL HIGH (ref 3.5–5.1)
Sodium: 143 mmol/L (ref 135–145)
TCO2: 39 mmol/L — ABNORMAL HIGH (ref 22–32)
pCO2 arterial: 68.3 mmHg (ref 32.0–48.0)
pH, Arterial: 7.34 — ABNORMAL LOW (ref 7.350–7.450)
pO2, Arterial: 80 mmHg — ABNORMAL LOW (ref 83.0–108.0)

## 2020-12-11 LAB — BASIC METABOLIC PANEL
Anion gap: 10 (ref 5–15)
Anion gap: 11 (ref 5–15)
BUN: 53 mg/dL — ABNORMAL HIGH (ref 8–23)
BUN: 48 mg/dL — ABNORMAL HIGH (ref 8–23)
CO2: 31 mmol/L (ref 22–32)
CO2: 33 mmol/L — ABNORMAL HIGH (ref 22–32)
Calcium: 8.3 mg/dL — ABNORMAL LOW (ref 8.9–10.3)
Calcium: 8.7 mg/dL — ABNORMAL LOW (ref 8.9–10.3)
Chloride: 101 mmol/L (ref 98–111)
Chloride: 101 mmol/L (ref 98–111)
Creatinine, Ser: 1.16 mg/dL (ref 0.61–1.24)
Creatinine, Ser: 1.13 mg/dL (ref 0.61–1.24)
GFR, Estimated: >60 mL/min (ref >60)
GFR, Estimated: >60 mL/min (ref >60)
Glucose, Bld: 206 mg/dL — ABNORMAL HIGH (ref 70–99)
Glucose, Bld: 209 mg/dL — ABNORMAL HIGH (ref 70–99)
Potassium: 6 mmol/L — ABNORMAL HIGH (ref 3.5–5.1)
Potassium: 5.5 mmol/L — ABNORMAL HIGH (ref 3.5–5.1)
Sodium: 142 mmol/L (ref 135–145)
Sodium: 145 mmol/L (ref 135–145)

## 2020-12-11 LAB — GLUCOSE, CAPILLARY
Glucose-Capillary: 158 mg/dL — ABNORMAL HIGH (ref 70–99)
Glucose-Capillary: 180 mg/dL — ABNORMAL HIGH (ref 70–99)
Glucose-Capillary: 183 mg/dL — ABNORMAL HIGH (ref 70–99)
Glucose-Capillary: 188 mg/dL — ABNORMAL HIGH (ref 70–99)
Glucose-Capillary: 201 mg/dL — ABNORMAL HIGH (ref 70–99)
Glucose-Capillary: 213 mg/dL — ABNORMAL HIGH (ref 70–99)
Glucose-Capillary: 219 mg/dL — ABNORMAL HIGH (ref 70–99)

## 2020-12-11 LAB — MAGNESIUM
Magnesium: 2.8 mg/dL — ABNORMAL HIGH (ref 1.7–2.4)
Magnesium: 2.8 mg/dL — ABNORMAL HIGH (ref 1.7–2.4)

## 2020-12-11 LAB — PHOSPHORUS
Phosphorus: 5.5 mg/dL — ABNORMAL HIGH (ref 2.5–4.6)
Phosphorus: 5.6 mg/dL — ABNORMAL HIGH (ref 2.5–4.6)

## 2020-12-11 LAB — URIC ACID: Uric Acid, Serum: 3.7 mg/dL (ref 3.7–8.6)

## 2020-12-11 MED ORDER — ALLOPURINOL 100 MG PO TABS
100.0000 mg | ORAL_TABLET | Freq: Three times a day (TID) | ORAL | Status: DC
  Administered 2020-12-11 – 2020-12-14 (×8): 100 mg
  Filled 2020-12-11 (×8): qty 1

## 2020-12-11 MED ORDER — HYDRALAZINE HCL 20 MG/ML IJ SOLN
10.0000 mg | Freq: Four times a day (QID) | INTRAMUSCULAR | Status: DC | PRN
  Administered 2020-12-11: 10 mg via INTRAVENOUS
  Filled 2020-12-11: qty 1

## 2020-12-11 MED ORDER — FENTANYL CITRATE (PF) 100 MCG/2ML IJ SOLN
50.0000 ug | Freq: Once | INTRAMUSCULAR | Status: AC
  Administered 2020-12-11: 50 ug via INTRAVENOUS

## 2020-12-11 MED ORDER — METOPROLOL TARTRATE 25 MG PO TABS
25.0000 mg | ORAL_TABLET | Freq: Two times a day (BID) | ORAL | Status: DC
  Filled 2020-12-11: qty 1

## 2020-12-11 MED ORDER — BISOPROLOL FUMARATE 5 MG PO TABS: 5.0000 mg | ORAL_TABLET | Freq: Two times a day (BID) | ORAL | Status: DC

## 2020-12-11 MED ORDER — PANTOPRAZOLE SODIUM 40 MG PO PACK
40.0000 mg | PACK | Freq: Every day | ORAL | Status: DC
  Administered 2020-12-12 – 2020-12-18 (×8): 40 mg
  Filled 2020-12-11 (×9): qty 20

## 2020-12-11 MED ORDER — METHYLPREDNISOLONE SODIUM SUCC 125 MG IJ SOLR
40.0000 mg | Freq: Once | INTRAMUSCULAR | Status: AC
  Administered 2020-12-11: 40 mg via INTRAVENOUS
  Filled 2020-12-11: qty 2

## 2020-12-11 MED ORDER — METOPROLOL TARTRATE 25 MG/10 ML ORAL SUSPENSION
25.0000 mg | ORAL | Status: AC
  Filled 2020-12-11: qty 10

## 2020-12-11 MED ORDER — ONDANSETRON HCL 4 MG/2ML IJ SOLN
4.0000 mg | Freq: Four times a day (QID) | INTRAMUSCULAR | Status: DC | PRN
  Administered 2020-12-11 – 2020-12-16 (×3): 4 mg via INTRAVENOUS
  Filled 2020-12-11 (×3): qty 2

## 2020-12-11 MED ORDER — HYDRALAZINE HCL 20 MG/ML IJ SOLN
10.0000 mg | INTRAMUSCULAR | Status: DC | PRN
  Administered 2020-12-11 – 2020-12-19 (×4): 10 mg via INTRAVENOUS
  Filled 2020-12-11 (×4): qty 1

## 2020-12-11 MED ORDER — CLONIDINE HCL 0.2 MG PO TABS: 0.2000 mg | ORAL_TABLET | Freq: Three times a day (TID) | ORAL | Status: DC

## 2020-12-11 MED ORDER — DOCUSATE SODIUM 50 MG/5ML PO LIQD
100.0000 mg | Freq: Two times a day (BID) | ORAL | Status: DC
  Administered 2020-12-11 – 2020-12-14 (×6): 100 mg
  Filled 2020-12-11 (×5): qty 10

## 2020-12-11 MED ORDER — MORPHINE SULFATE (PF) 2 MG/ML IV SOLN
2.0000 mg | Freq: Once | INTRAVENOUS | Status: AC
  Administered 2020-12-11: 2 mg via INTRAVENOUS
  Filled 2020-12-11: qty 1

## 2020-12-11 NOTE — Progress Notes (Signed)
[facility] Progress Note Patient Name: ROD MAJERUS DOB: 1956/11/22 MRN: [id number]   Date of Service  12/11/2020  HPI/Events of Note  CxR: NG/OG going below diagpahram towards stomach.  Report same. Film reviewed.  eICU Interventions  Ok to use it     Intervention Category Intermediate Interventions: Diagnostic test evaluation  Elmer Sow 12/11/2020, 9:42 PM

## 2020-12-11 NOTE — Op Note (Signed)
Intubation Procedure Note  Isaac [name]  [id number]  01/07/1956  Date:12/11/20  Time:7:50 PM   Provider Performing:Laasya Peyton R Babak Lucus    Procedure: Intubation (10315)  Indication(s) Respiratory Failure  Consent Unable to obtain consent due to emergent nature of procedure.   Anesthesia Fentanyl   Time Out Verified patient identification, verified procedure, site/side was marked, verified correct patient position, special equipment/implants available, medications/allergies/relevant history reviewed, required imaging and test results available.   Sterile Technique Usual hand hygeine, masks, and gloves were used   Procedure Description Patient positioned in bed supine.  Sedation given as noted above.  Patient was intubated with endotracheal tube using 3 miller .  View was Grade 2 only posterior commissure .  Number of attempts was 1.  Colorimetric CO2 detector was consistent with tracheal placement. Bilateral bs intact but diminished. 25cm at the lip.   Complications/Tolerance None; patient tolerated the procedure well. Chest X-ray is ordered to verify placement.   EBL none   Specimen(s) None

## 2020-12-11 NOTE — Progress Notes (Signed)
[facility] Progress Note Patient Name: Isaac [name] DOB: Aug 09, 1956 MRN: [id number]   Date of Service  12/11/2020  HPI/Events of Note  RT called with abnormal results, ph 7.34 and pCo2 68.3, She said she changed the rate from 20 to 24.  eICU Interventions  OK. COPD/CLL. Watch for any auto peep.      Intervention Category Intermediate Interventions: Diagnostic test evaluation  Elmer Sow 12/11/2020, 9:05 PM

## 2020-12-11 NOTE — Progress Notes (Signed)
Patient gave me permission to shave beard so that bipap mask have proper seal.  Will continue to monitor patient.

## 2020-12-11 NOTE — Progress Notes (Signed)
[facility] Progress Note Patient Name: Isaac [name] DOB: [birth year] MRN: [id number]   Date of Service  12/11/2020  HPI/Events of Note  K up from tumor lysis syndrome, but stable at 6 as yesterday.    eICU Interventions  Continue care.      Intervention Category Intermediate Interventions: Diagnostic test evaluation;Electrolyte abnormality - evaluation and management  Elmer Sow 12/11/2020, 6:27 AM

## 2020-12-11 NOTE — Progress Notes (Signed)
NAME:  Isaac [name], MRN:  [id number], DOB:  [birth year], LOS: 3 ADMISSION DATE:  12/08/2020, CONSULTATION DATE:  12/11/20 REFERRING MD:  EDP, CHIEF COMPLAINT:  Shortness of breath   Brief History:  65 y.o. y/o M with PMH of COPD on 4L home O2, HTN, HL, prior tobacco use who was brought to the ED by EMS for shortness of breath.  Just prior to arriving to the ED, pt became unresponsive and was intubated on arrival.  ABG with severe Hypercapnic respiratory failure and CXR with possible infiltrates  History of Present Illness:  Isaac [name] is a 65 y.o. M with PMH of  COPD on 4L home O2 followed by Dr. Chase Caller, HTN, HL, prior tobacco use who called EMS for worsening shortness of breath.  En route to the ED, he became unresponsive and required intubation on arrival.   Further history obtained from patient's wife, she states that he has been compliant with wearing home O2, does not wear CPAP or Bipap and that his dyspnea started with mild cough 2-3 days ago.  No known fever, but felt warm.  He is vaccinated, but not boosted for Covid-19.  In the ED, workup was significant for hypercapnic respiratory failure with ABG 7.14/101/190/33,  WBC 299k, potassium 6.0 and possibly patchy infiltrates on CXR.  Covid-19 and influenza are negative.  He has had an elevated WBC in the past and was referred to Oncology, he was not able to make his last appointment and has not scheduled a follow up as he was feeling ok.  PCCM consulted for admission  Past Medical History:   has a past medical history of Anxiety, COPD (chronic obstructive pulmonary disease) ([place]), GERD (gastroesophageal reflux disease), Hematuria, Hyperlipidemia, Hypertension, Insomnia, Nephrolithiasis, and Tobacco dependence.  Significant Hospital Events:  1/13 Intubated in ED, PCCM admit  Consults:  Oncology  Procedures:  1/13 ETT  Significant Diagnostic Tests:  1/13 CXR >> Diffuse interstitial opacities throughout both lungs with some  additional patchy areas of peripheral consolidation. Findings could reflect atypical infection (including COVID 19) and/or developing pulmonary edema. LE doppler US 1/14 >> negative for DVT  Micro Data:  1/13 Covid-19, Flu >> Negative 1/13 Urine Culture >> negative   Antimicrobials:  Ceftriaxone 1/13 >> Azithromycin 1/13 >>   Interim History / Subjective:   Propofol transitioned over to Precedex Was on phenylephrine, now weaned to off Uric acid 3.7, K 6.0 Tolerating PSV 10  Objective   Blood pressure 121/72, pulse 94, temperature 97.7 F (36.5 C), temperature source Oral, resp. rate 16, height 5\' 10"  (1.778 m), weight 121.8 kg, SpO2 93 %.    Vent Mode: CPAP;PSV FiO2 (%):  [40 %] 40 % Set Rate:  [16 bmp] 16 bmp Vt Set:  [580 mL] 580 mL PEEP:  [5 cmH20] 5 cmH20 Pressure Support:  [10 cmH20-15 cmH20] 10 cmH20 Plateau Pressure:  [20 cmH20-23 cmH20] 20 cmH20   Intake/Output Summary (Last 24 hours) at 12/11/2020 0738 Last data filed at 12/11/2020 0700 Gross per 24 hour  Intake 3133.24 ml  Output 1650 ml  Net 1483.24 ml   Filed Weights   12/09/20 0500 12/10/20 0500 12/11/20 0500  Weight: 122 kg 122.9 kg 121.8 kg   Gen: Ill-appearing man, calm, ventilated HEENT: Pupils equal, ET tube in place, oropharynx clear Lungs: Distant, smallest volumes, bilateral soft expiratory wheezes.  Strong cough CV: Distant, regular, tachycardic, no murmur Abdomen: Paradoxical abdominal movement with expiration, somewhat tympanitic, positive bowel sounds Extremities no edema Neuro: Awake, alert, appropriate,  follows commands, good strength  Resolved Hospital Problem list   N/A  Assessment & Plan:   # Acute on Chronic Hypoxic and Hypercapnic Respiratory Failure: Severe underlying COPD with last PFTs in 2015 showing a FEV1/FVC of 29. -Tolerating PSV currently.  I believe he would likely extubate today. -Continue DuoNeb, plan for treatment now prior to attempted extubation -VAP prevention  order set -Pulmonary hygiene -Azithromycin and ceftriaxone finished today 1/16 -Solu-Medrol converted to prednisone on 1/15, plan to taper over about 7 days -Okay to extubate on Precedex, we will then work on weaning to off  # Chronic Lymphocytic Leukemia:  On chart review, looks like patient did follow-up with Dr. Alen Blew in 2019 and was told at that time he had confirmed CLL.   -Appreciate hematology/oncology assistance -Flow cytometry ordered and pending  # Acute Kidney Injury: Creatinine improving with adequate urine output # Tumor lysis syndrome: Has received rasburicasex4.  -Uric acid 3.7, no plans for any further rasburicase at this time -Continue scheduled Lokelma -Follow BMP, phosphorus, magnesium levels closely -Plan to continue allopurinol for now since he continues to have hyperkalemia.  -Continue IV fluid 75 cc/h, goal decrease next 24 hours -G6PD > 16.4 (drawn on 1/13)  # Hypotension: Possibly in the setting of sedation, resolved  # Hx of Hypertension  -Plan to restart some of his antihypertensives: Add back clonidine and bisoprolol today 1/16.  Continue to hold Diovan HCT  # Unilateral edema:  -No evidence DVT Doppler ultrasound 1/14  #Constipation -Bowel regimen  #Hyperglycemia, no history DM, likely due to steroids -Sliding-scale insulin as ordered  Best practice (evaluated daily)  Diet: Tube Feeds  Pain/Anxiety/Delirium protocol (if indicated): Propofol, Fentanyl VAP protocol (if indicated): HOB 30 degrees, suction prn DVT prophylaxis: Lovenox GI prophylaxis: Protonix Glucose control: SSI Mobility: bed rest Disposition: ICU Family Communication: Patient's wife updated 1/16  Goals of Care:  Last date of multidisciplinary goals of care discussion:1/13 Family and staff present:  Summary of discussion:  Follow up goals of care discussion due: 1/20 Code Status: Full code  Labs   CBC: Recent Labs  Lab 12/08/20 0100 12/08/20 0226 12/08/20 0501  12/08/20 1046 12/09/20 0445 12/10/20 0129 12/11/20 0438  WBC 299.1*  --  172.3*  --  216.1* 194.4* 206.9*  NEUTROABS 8.9*  --   --   --  13.3* 10.9* 9.4*  HGB 11.5* 14.3 10.9* 11.5* 10.8* 10.4* 10.3*  HCT 42.5 42.0 37.4*  --  35.6* 36.0* 35.4*  MCV 92.0  --  89.7  --  90.1 91.8 93.4  PLT 364  --  305  --  PLATELET CLUMPS NOTED ON SMEAR, UNABLE TO ESTIMATE 290 132    Basic Metabolic Panel: Recent Labs  Lab 12/09/20 0445 12/09/20 0648 12/09/20 1819 12/10/20 0129 12/10/20 1643 12/11/20 0438  NA  --  135 138 137 139 142  K  --  6.2* 5.9* 5.7* 6.1* 6.0*  CL  --  98 98 98 99 101  CO2  --  27 30 30 29 31   GLUCOSE  --  185* 197* 212* 214* 206*  BUN  --  41* 45* 45* 49* 53*  CREATININE  --  1.40* 1.29* 1.18 1.22 1.16  CALCIUM  --  8.0* 8.1* 8.1* 8.2* 8.3*  MG 2.2  --  3.1* 3.1* 3.0* 2.8*  PHOS 7.0*  --  6.4* 6.0* 7.3* 5.5*   GFR: Estimated Creatinine Clearance: 83.1 mL/min (by C-G formula based on SCr of 1.16 mg/dL). Recent Labs  Lab 12/08/20  0501 12/09/20 0445 12/10/20 0129 12/11/20 0438  WBC 172.3* 216.1* 194.4* 206.9*    Liver Function Tests: No results for input(s): AST, ALT, ALKPHOS, BILITOT, PROT, ALBUMIN in the last 168 hours. No results for input(s): LIPASE, AMYLASE in the last 168 hours. No results for input(s): AMMONIA in the last 168 hours.  ABG    Component Value Date/Time   PHART 7.268 (L) 12/08/2020 0226   PCO2ART 77.5 (HH) 12/08/2020 0226   PO2ART 74 (L) 12/08/2020 0226   HCO3 35.7 (H) 12/08/2020 0226   TCO2 38 (H) 12/08/2020 0226   O2SAT 92.0 12/08/2020 0226     Coagulation Profile: No results for input(s): INR, PROTIME in the last 168 hours.  Cardiac Enzymes: No results for input(s): CKTOTAL, CKMB, CKMBINDEX, TROPONINI in the last 168 hours.  HbA1C: Hgb A1c MFr Bld  Date/Time Value Ref Range Status  12/08/2020 05:01 AM 6.7 (H) 4.8 - 5.6 % Final    Comment:    (NOTE) Pre diabetes:          5.7%-6.4%  Diabetes:               >6.4%  Glycemic control for   <7.0% adults with diabetes     CBG: Recent Labs  Lab 12/10/20 1156 12/10/20 1559 12/10/20 1920 12/10/20 2253 12/11/20 0310  GLUCAP 174* 195* 202* 183* 188*    Independent CC time 34 minutes  Baltazar Apo, MD, PhD 12/11/2020, 7:38 AM [facility] [phone] or if no answer [phone]

## 2020-12-11 NOTE — Progress Notes (Signed)
[facility] Progress Note Patient Name: NICKI GRACY DOB: 1955/12/13 MRN: [id number]   Date of Service  12/11/2020  HPI/Events of Note  Camera for alert. Lethargic. Extubated earlier in the day to BiPAP. Off of all sedation. Not on pressors.  VS: persisting sinus tachy, MAP ok. sats 96% on 12/8, Ve 12 Lit. Obese. GCS low.   Acute on chr type 2 failure CLL/Tumor lysis syndrome. No leukemic blast conversion. Poor prognsosi as per Oncology notes.   eICU Interventions  - getting stat ABG. Dr Lamonte Sakai, been notified by bed side. - intubate if pco2 going up and or unable to protect airways.      Intervention Category Major Interventions: Respiratory failure - evaluation and management;Change in mental status - evaluation and management  Elmer Sow 12/11/2020, 7:19 PM

## 2020-12-11 NOTE — Procedures (Signed)
Extubation Procedure Note  Patient Details:   Name: Isaac [name] DOB: December 20, 1955 MRN: [id number]   Airway Documentation:    Vent end date: 12/11/20 Vent end time: 0846   Evaluation  O2 sats: stable throughout Complications: No apparent complications Patient did tolerate procedure well. Bilateral Breath Sounds: Diminished   Yes   Pt extubated per MD order. Positive cuff leak heard prior to extubation. Pt on 6L [place].  Esperanza Sheets T 12/11/2020, 8:51 AM

## 2020-12-11 NOTE — Progress Notes (Signed)
RT placed patient on bipap per MD order. RN at bedside. Patient tolerating well at this time. RT will continue to monitor.

## 2020-12-11 NOTE — Progress Notes (Signed)
[facility] Progress Note Patient Name: GLEN KESINGER DOB: Jun 01, 1956 MRN: [id number]   Date of Service  12/11/2020  HPI/Events of Note  Intubated. Need ET to advance by RT and follow up CxR as per RN request.  eICU Interventions  Both ordered     Intervention Category Intermediate Interventions: Other:  Elmer Sow 12/11/2020, 8:28 PM

## 2020-12-12 DIAGNOSIS — J9602 Acute respiratory failure with hypercapnia: Secondary | ICD-10-CM | POA: Diagnosis not present

## 2020-12-12 DIAGNOSIS — C9110 Chronic lymphocytic leukemia of B-cell type not having achieved remission: Secondary | ICD-10-CM | POA: Diagnosis not present

## 2020-12-12 LAB — PHOSPHORUS
Phosphorus: 4.5 mg/dL (ref 2.5–4.6)
Phosphorus: 4.2 mg/dL (ref 2.5–4.6)

## 2020-12-12 LAB — GLUCOSE, CAPILLARY
Glucose-Capillary: 141 mg/dL — ABNORMAL HIGH (ref 70–99)
Glucose-Capillary: 151 mg/dL — ABNORMAL HIGH (ref 70–99)
Glucose-Capillary: 156 mg/dL — ABNORMAL HIGH (ref 70–99)
Glucose-Capillary: 156 mg/dL — ABNORMAL HIGH (ref 70–99)
Glucose-Capillary: 156 mg/dL — ABNORMAL HIGH (ref 70–99)
Glucose-Capillary: 184 mg/dL — ABNORMAL HIGH (ref 70–99)
Glucose-Capillary: 201 mg/dL — ABNORMAL HIGH (ref 70–99)

## 2020-12-12 LAB — BASIC METABOLIC PANEL
Anion gap: 10 (ref 5–15)
Anion gap: 10 (ref 5–15)
BUN: 56 mg/dL — ABNORMAL HIGH (ref 8–23)
BUN: 59 mg/dL — ABNORMAL HIGH (ref 8–23)
CO2: 34 mmol/L — ABNORMAL HIGH (ref 22–32)
CO2: 32 mmol/L (ref 22–32)
Calcium: 8.7 mg/dL — ABNORMAL LOW (ref 8.9–10.3)
Calcium: 8.5 mg/dL — ABNORMAL LOW (ref 8.9–10.3)
Chloride: 103 mmol/L (ref 98–111)
Chloride: 104 mmol/L (ref 98–111)
Creatinine, Ser: 1.35 mg/dL — ABNORMAL HIGH (ref 0.61–1.24)
Creatinine, Ser: 1.14 mg/dL (ref 0.61–1.24)
GFR, Estimated: 58 mL/min — ABNORMAL LOW (ref >60)
GFR, Estimated: >60 mL/min (ref >60)
Glucose, Bld: 176 mg/dL — ABNORMAL HIGH (ref 70–99)
Glucose, Bld: 227 mg/dL — ABNORMAL HIGH (ref 70–99)
Potassium: 5 mmol/L (ref 3.5–5.1)
Potassium: 5.7 mmol/L — ABNORMAL HIGH (ref 3.5–5.1)
Sodium: 147 mmol/L — ABNORMAL HIGH (ref 135–145)
Sodium: 146 mmol/L — ABNORMAL HIGH (ref 135–145)

## 2020-12-12 LAB — CBC WITH DIFFERENTIAL/PLATELET
Abs Immature Granulocytes: 1.18 10*3/uL — ABNORMAL HIGH (ref 0.00–0.07)
Basophils Absolute: 0.4 10*3/uL — ABNORMAL HIGH (ref 0.0–0.1)
Basophils Relative: 0 %
Eosinophils Absolute: 0 10*3/uL (ref 0.0–0.5)
Eosinophils Relative: 0 %
HCT: 37.3 % — ABNORMAL LOW (ref 39.0–52.0)
Hemoglobin: 9.4 g/dL — ABNORMAL LOW (ref 13.0–17.0)
Immature Granulocytes: 0 %
Lymphocytes Relative: 87 %
Lymphs Abs: 226.5 10*3/uL — ABNORMAL HIGH (ref 0.7–4.0)
MCH: 24.3 pg — ABNORMAL LOW (ref 26.0–34.0)
MCHC: 25.2 g/dL — ABNORMAL LOW (ref 30.0–36.0)
MCV: 96.4 fL (ref 80.0–100.0)
Monocytes Absolute: 24.1 10*3/uL — ABNORMAL HIGH (ref 0.1–1.0)
Monocytes Relative: 9 %
Neutro Abs: 10.8 10*3/uL — ABNORMAL HIGH (ref 1.7–7.7)
Neutrophils Relative %: 4 %
Platelets: 336 10*3/uL (ref 150–400)
RBC: 3.87 MIL/uL — ABNORMAL LOW (ref 4.22–5.81)
RDW: 14.3 % (ref 11.5–15.5)
WBC: 263 10*3/uL (ref 4.0–10.5)
nRBC: 0 % (ref 0.0–0.2)

## 2020-12-12 LAB — MAGNESIUM
Magnesium: 3 mg/dL — ABNORMAL HIGH (ref 1.7–2.4)
Magnesium: 3 mg/dL — ABNORMAL HIGH (ref 1.7–2.4)

## 2020-12-12 MED ORDER — SODIUM ZIRCONIUM CYCLOSILICATE 5 G PO PACK
10.0000 g | PACK | Freq: Two times a day (BID) | ORAL | Status: DC
  Administered 2020-12-12 – 2020-12-13 (×2): 10 g
  Filled 2020-12-12 (×3): qty 2

## 2020-12-12 MED ORDER — SENNA 8.6 MG PO TABS: 1.0000 | ORAL_TABLET | Freq: Every day | ORAL | Status: DC | PRN

## 2020-12-12 MED ORDER — METOPROLOL TARTRATE 5 MG/5ML IV SOLN
5.0000 mg | Freq: Once | INTRAVENOUS | Status: AC
  Administered 2020-12-12: 5 mg via INTRAVENOUS
  Filled 2020-12-12: qty 5

## 2020-12-12 MED ORDER — SODIUM CHLORIDE 0.45 % IV SOLN: INTRAVENOUS | Status: DC

## 2020-12-12 MED ORDER — SENNA 8.6 MG PO TABS
1.0000 | ORAL_TABLET | Freq: Once | ORAL | Status: AC
  Administered 2020-12-12: 8.6 mg via ORAL
  Filled 2020-12-12: qty 1

## 2020-12-12 MED ORDER — SODIUM CHLORIDE 0.9 % IV SOLN
250.0000 mL | INTRAVENOUS | Status: DC
  Administered 2020-12-12: 250 mL via INTRAVENOUS

## 2020-12-12 MED ORDER — METOPROLOL TARTRATE 5 MG/5ML IV SOLN
2.5000 mg | INTRAVENOUS | Status: DC | PRN
  Administered 2020-12-13 – 2020-12-18 (×4): 5 mg via INTRAVENOUS
  Filled 2020-12-12 (×5): qty 5

## 2020-12-12 NOTE — Progress Notes (Signed)
OT Cancellation Note  Patient Details Name: WYLAN GENTZLER MRN: [id number] DOB: [birth year]   Cancelled Treatment:    Reason Eval/Treat Not Completed: Patient not medically ready;Medical issues which prohibited therapy (Pt extubated 1/16 and reintubated same day. Not appropriate for OT eval at this time. OT to continue to follow.)    Cherylann Banas, OTR/L Acute Rehabilitation Services Pager: [phone] Office: [phone]   12/12/2020, 10:28 AM

## 2020-12-12 NOTE — Progress Notes (Signed)
NAME:  Isaac [name], MRN:  [id number], DOB:  [birth year], LOS: 4 ADMISSION DATE:  12/08/2020, CONSULTATION DATE:  12/12/20 REFERRING MD:  EDP, CHIEF COMPLAINT:  Shortness of breath   Brief History:  65 y.o. y/o M with PMH of COPD on 4L home O2, HTN, HL, prior tobacco use who was brought to the ED by EMS for shortness of breath.  Just prior to arriving to the ED, pt became unresponsive and was intubated on arrival.  ABG with severe Hypercapnic respiratory failure and CXR with possible infiltrates  History of Present Illness:  Isaac [name] is a 65 y.o. M with PMH of  COPD on 4L home O2 followed by Dr. Chase Caller, HTN, HL, prior tobacco use who called EMS for worsening shortness of breath.  En route to the ED, he became unresponsive and required intubation on arrival.   Further history obtained from patient's wife, she states that he has been compliant with wearing home O2, does not wear CPAP or Bipap and that his dyspnea started with mild cough 2-3 days ago.  No known fever, but felt warm.  He is vaccinated, but not boosted for Covid-19.  In the ED, workup was significant for hypercapnic respiratory failure with ABG 7.14/101/190/33,  WBC 299k, potassium 6.0 and possibly patchy infiltrates on CXR.  Covid-19 and influenza are negative.  He has had an elevated WBC in the past and was referred to Oncology, he was not able to make his last appointment and has not scheduled a follow up as he was feeling ok.  PCCM consulted for admission  Past Medical History:   has a past medical history of Anxiety, COPD (chronic obstructive pulmonary disease) ([place]), GERD (gastroesophageal reflux disease), Hematuria, Hyperlipidemia, Hypertension, Insomnia, Nephrolithiasis, and Tobacco dependence.  Significant Hospital Events:  1/13 Intubated in ED, PCCM admit  Consults:  Oncology  Procedures:  1/13 ETT >> 1/16; 1/16 >>   Significant Diagnostic Tests:  1/13 CXR >> Diffuse interstitial opacities throughout both  lungs with some additional patchy areas of peripheral consolidation. Findings could reflect atypical infection (including COVID 19) and/or developing pulmonary edema. LE doppler US 1/14 >> negative for DVT  Micro Data:  1/13 Covid-19, Flu >> Negative 1/13 Urine Culture >> negative   Antimicrobials:  Ceftriaxone 1/13 >> Azithromycin 1/13 >>   Interim History / Subjective:   Failed extubation 1/16, required BiPAP for several hours and then was reintubated Precedex 0.5, fentanyl and phenylephrine have both been weaned off this morning, was on low-dose overnight.  0.40+ PEEP 5.  I/O6 0.1 L total. Potassium improved 5.0 Creatinine up slightly 1.13> 1.35 WBC 236K  Objective   Blood pressure 129/70, pulse (!) 109, temperature 99.4 F (37.4 C), temperature source Oral, resp. rate (!) 24, height 5\' 10"  (1.778 m), weight 119.9 kg, SpO2 90 %.    Vent Mode: PRVC FiO2 (%):  [40 %-80 %] 40 % Set Rate:  [15 bmp-24 bmp] 24 bmp Vt Set:  [600 mL] 600 mL PEEP:  [5 cmH20-8 cmH20] 5 cmH20 Pressure Support:  [10 cmH20] 10 cmH20 Plateau Pressure:  [22 cmH20-23 cmH20] 22 cmH20   Intake/Output Summary (Last 24 hours) at 12/12/2020 0826 Last data filed at 12/12/2020 0800 Gross per 24 hour  Intake 1565.88 ml  Output 1750 ml  Net -184.12 ml   Filed Weights   12/10/20 0500 12/11/20 0500 12/12/20 0500  Weight: 122.9 kg 121.8 kg 119.9 kg   Gen: Acute and chronically ill man, ventilated, calm HEENT: Pupils equal, ET tube  in place, oropharynx otherwise clear Lungs: Distant, small volumes, no wheezing CV: Distant, regular, borderline tachycardic, no murmur Abdomen: Abdominal movement is improved, no paradoxical movement, hypoactive bowel sounds Extremities no edema Neuro: Wakes to voice, nods to questions, follows commands  Resolved Hospital Problem list   N/A  Assessment & Plan:   # Acute on Chronic Hypoxic and Hypercapnic Respiratory Failure: Severe underlying COPD with last PFTs in 2015 showing  a FEV1/FVC of 29. -Failed extubation on 1/16.  Wheezing improved 1/17.  Okay to retry PSV.  Could consider another extubation attempt if he performed very well.  Otherwise I did like to leave him intubated for another day and then probably retry on 1/18 if no new issues. -Continue DuoNeb scheduled -Azithromycin and ceftriaxone 5-day course completed -Continue prednisone 40 mg daily per tube -May ultimately decide to extubate on Precedex  # Chronic Lymphocytic Leukemia:  On chart review, looks like patient did follow-up with Dr. Alen Blew in 2019 and was told at that time he had confirmed CLL.   -Appreciate hematology/oncology assistance -Flow cytometry from 1/14 pending  # Acute Kidney Injury: Creatinine improving with adequate urine output # Tumor lysis syndrome: Has received rasburicasex4.  -Uric acid 1/16 was reassuring -Potassium has improved to 5.0 on scheduled Lokelma.  Plan to decrease to twice daily 1/17, follow BMP, phosphorus, magnesium levels closely -Plan to continue allopurinol at least until his hyperkalemia improves -Continue IV fluids 75 cc/h, change to half-normal saline on 1/17  #Hypernatremia, mild -Change IV fluid resuscitation to half-normal saline.  Add free water if sodium trend continues  # Hypotension: Possibly in the setting of sedation, resolved  # Hx of Hypertension  -Had restarted his clonidine, metoprolol (home med list says bisoprolol but he was actually on metoprolol twice daily) on 1/16 at the time of extubation.  Plan to hold given his reintubation, need for sedation currently.  Restart once stabilized -Continue to hold Diovan HCT  # Unilateral edema:  -No evidence DVT Doppler ultrasound 1/14  #Constipation -Bowel regimen  #Hyperglycemia, no history DM, likely due to steroids -Sliding-scale insulin as ordered  Best practice (evaluated daily)  Diet: Tube Feeds  Pain/Anxiety/Delirium protocol (if indicated): Precedex, Fentanyl VAP protocol (if  indicated): HOB 30 degrees, suction prn DVT prophylaxis: Lovenox GI prophylaxis: Protonix Glucose control: SSI Mobility: bed rest Disposition: ICU Family Communication: Patient's wife updated by phone 1/17  Goals of Care:  Last date of multidisciplinary goals of care discussion:1/13 Family and staff present:  Summary of discussion:  Follow up goals of care discussion due: 1/20 Code Status: Full code  Labs   CBC: Recent Labs  Lab 12/08/20 0100 12/08/20 0226 12/08/20 0501 12/08/20 1046 12/09/20 0445 12/10/20 0129 12/11/20 0438 12/11/20 2053 12/12/20 0149  WBC 299.1*  --  172.3*  --  216.1* 194.4* 206.9*  --  263.0*  NEUTROABS 8.9*  --   --   --  13.3* 10.9* 9.4*  --  10.8*  HGB 11.5*   < > 10.9*   < > 10.8* 10.4* 10.3* 12.2* 9.4*  HCT 42.5   < > 37.4*  --  35.6* 36.0* 35.4* 36.0* 37.3*  MCV 92.0  --  89.7  --  90.1 91.8 93.4  --  96.4  PLT 364  --  305  --  PLATELET CLUMPS NOTED ON SMEAR, UNABLE TO ESTIMATE 290 287  --  336   < > = values in this interval not displayed.    Basic Metabolic Panel: Recent Labs  Lab  12/10/20 0129 12/10/20 1643 12/11/20 0438 12/11/20 1630 12/11/20 1930 12/11/20 2053 12/12/20 0149  NA 137 139 142  --  145 143 147*  K 5.7* 6.1* 6.0*  --  5.5* 5.6* 5.0  CL 98 99 101  --  101  --  103  CO2 30 29 31   --  33*  --  34*  GLUCOSE 212* 214* 206*  --  209*  --  176*  BUN 45* 49* 53*  --  48*  --  56*  CREATININE 1.18 1.22 1.16  --  1.13  --  1.35*  CALCIUM 8.1* 8.2* 8.3*  --  8.7*  --  8.7*  MG 3.1* 3.0* 2.8* 2.8*  --   --  3.0*  PHOS 6.0* 7.3* 5.5* 5.6*  --   --  4.5   GFR: Estimated Creatinine Clearance: 70.8 mL/min (A) (by C-G formula based on SCr of 1.35 mg/dL (H)). Recent Labs  Lab 12/09/20 0445 12/10/20 0129 12/11/20 0438 12/12/20 0149  WBC 216.1* 194.4* 206.9* 263.0*    Liver Function Tests: No results for input(s): AST, ALT, ALKPHOS, BILITOT, PROT, ALBUMIN in the last 168 hours. No results for input(s): LIPASE, AMYLASE in  the last 168 hours. No results for input(s): AMMONIA in the last 168 hours.  ABG    Component Value Date/Time   PHART 7.340 (L) 12/11/2020 2053   PCO2ART 68.3 (HH) 12/11/2020 2053   PO2ART 80 (L) 12/11/2020 2053   HCO3 36.9 (H) 12/11/2020 2053   TCO2 39 (H) 12/11/2020 2053   O2SAT 94.0 12/11/2020 2053     Coagulation Profile: No results for input(s): INR, PROTIME in the last 168 hours.  Cardiac Enzymes: No results for input(s): CKTOTAL, CKMB, CKMBINDEX, TROPONINI in the last 168 hours.  HbA1C: Hgb A1c MFr Bld  Date/Time Value Ref Range Status  12/08/2020 05:01 AM 6.7 (H) 4.8 - 5.6 % Final    Comment:    (NOTE) Pre diabetes:          5.7%-6.4%  Diabetes:              >6.4%  Glycemic control for   <7.0% adults with diabetes     CBG: Recent Labs  Lab 12/11/20 2006 12/11/20 2340 12/12/20 0343 12/12/20 0446 12/12/20 0746  GLUCAP 180* 158* 141* 151* 156*    Independent CC time 33 minutes  Baltazar Apo, MD, PhD 12/12/2020, 8:26 AM [facility] [phone] or if no answer [phone]

## 2020-12-12 NOTE — Progress Notes (Signed)
Paged Dr. Lamonte Sakai to notify him that pt went into a-fib rvr w/rate in the 140s-150s, O2 92%, BP 155/89, RR 22  Pt denies CP, dizziness, nausea  RRT switched back to full support on vent around 1600 d/t HR in 150s  Pt is alert and oriented  Will wait for further instructions.

## 2020-12-12 NOTE — Progress Notes (Signed)
Events noted and labs reviewed. Patient reintubated and back on the vent. Labs showed increase in his wbc due to lymphocytosis. Stable hgb and platelet count.    TLS markers improving with phos normalizing and calcium is stable. K is down as well. Slight increase in creatinine.   ASSESSMENT AND PLAN:  65 year old man:  1.CLL presented with lymphocytosis in January 2019: flow cytometry confirmed the diagnosis then and patient failed to follow up after that. He presented with increased lymphocytosis and TLS.   He is in a difficult spot with wbc increasing and would require treatment soon.  Although I don't think his respiratory failure is related to his hematological condition. Treating CLL with oral targeted therapy (ibrutinb) or chemotherapy would be challenging given his respiratory status.   I will repeat his flow cytometry and FISH panel to determine best course of action. We might treat him while he is hospitalized (once respiratory status is stable) for better monitoring ofTLS.      2.  Tumor lysis syndrome secondary to CLL: improved and currently on allopurinol.   3. Acute on chronic hypoxic respiratory failure.  Per the primary team. Unrelated to his CLL.    Will continue to follow.

## 2020-12-12 NOTE — Progress Notes (Signed)
Paged Dr. Lamonte Sakai to notify him of pt's HR in the 150s... BP 161/85, HR 144, RR 22, O2 93  Pt is asymptomatic.   Also paged the Critical Care Pager and Dr. Tamala Julian called and ordered 5 mg of Metoprolol.

## 2020-12-12 NOTE — Progress Notes (Signed)
PT Cancellation Note  Patient Details Name: Isaac [name] MRN: [id number] DOB: [birth year]   Cancelled Treatment:    Reason Eval/Treat Not Completed: Patient not medically ready (order written post extubation 1/16 with pt then reintubated later in the day on 1/16 and not currently medically appropriate)   [name] B Fields Oros 12/12/2020, 9:00 AM  [facility] Pager: [phone] Office: [phone]

## 2020-12-12 NOTE — Progress Notes (Signed)
RT NOTE: RT attempted SBT this morning and had to place patient back on full support due to de-sating. RT attempted SBT again this afternoon and had to place patient back on full support due to HR increasing to 150's. RN aware and currently bedside. RT will continue to monitor.

## 2020-12-12 NOTE — Progress Notes (Signed)
[facility] Progress Note Patient Name: Isaac [name] DOB: [birth year] MRN: [id number]   Date of Service  12/12/2020  HPI/Events of Note  Tumor lysis, getting NS 20 ml/hr, notes reviewed by RN, discussion : 75 ml/hr. Need clarification.  eICU Interventions  Changed to NS 75 ml/hr to decrease viscosity from CLL. CxR film seen. Increased Bronchovesicular markings rt > left.      Intervention Category Minor Interventions: Other:;Routine modifications to care plan (e.g. PRN medications for pain, fever)  Elmer Sow 12/12/2020, 1:52 AM

## 2020-12-12 NOTE — Progress Notes (Signed)
[facility] Progress Note Patient Name: Isaac [name] DOB: 1956-01-28 MRN: [id number]   Date of Service  12/12/2020  HPI/Events of Note  Episode of afib RVR earlier and was given a one time dose of lopressor 25 at 4 pm. Bedside RN inquiring if this should be resumed since this is a home medication BP 95/54 after Fentanyl , HR 100  eICU Interventions  Ordered lopressor IV prn for now. Bedside MD to assess in am if home dose lopressor may be resumed     Intervention Category Intermediate Interventions: Arrhythmia - evaluation and management  Judd Lien 12/12/2020, 9:30 PM

## 2020-12-12 NOTE — Progress Notes (Signed)
Writer notified spouse Laval Cafaro) of overnight events, reintubation and current status. Spouse verbalized understanding and thanked Probation officer for calling.

## 2020-12-13 DIAGNOSIS — J9602 Acute respiratory failure with hypercapnia: Secondary | ICD-10-CM | POA: Diagnosis not present

## 2020-12-13 DIAGNOSIS — C9110 Chronic lymphocytic leukemia of B-cell type not having achieved remission: Secondary | ICD-10-CM | POA: Diagnosis not present

## 2020-12-13 LAB — CBC WITH DIFFERENTIAL/PLATELET
Abs Immature Granulocytes: 0.37 10*3/uL — ABNORMAL HIGH (ref 0.00–0.07)
Basophils Absolute: 0.3 10*3/uL — ABNORMAL HIGH (ref 0.0–0.1)
Basophils Relative: 0 %
Eosinophils Absolute: 0 10*3/uL (ref 0.0–0.5)
Eosinophils Relative: 0 %
HCT: 36.5 % — ABNORMAL LOW (ref 39.0–52.0)
Hemoglobin: 10.1 g/dL — ABNORMAL LOW (ref 13.0–17.0)
Immature Granulocytes: 0 %
Lymphocytes Relative: 89 %
Lymphs Abs: 119 10*3/uL — ABNORMAL HIGH (ref 0.7–4.0)
MCH: 26.2 pg (ref 26.0–34.0)
MCHC: 27.7 g/dL — ABNORMAL LOW (ref 30.0–36.0)
MCV: 94.8 fL (ref 80.0–100.0)
Monocytes Absolute: 8.7 10*3/uL — ABNORMAL HIGH (ref 0.1–1.0)
Monocytes Relative: 7 %
Neutro Abs: 5.7 10*3/uL (ref 1.7–7.7)
Neutrophils Relative %: 4 %
Platelets: 219 10*3/uL (ref 150–400)
RBC: 3.85 MIL/uL — ABNORMAL LOW (ref 4.22–5.81)
RDW: 14.1 % (ref 11.5–15.5)
WBC: 134 10*3/uL (ref 4.0–10.5)
nRBC: 0 % (ref 0.0–0.2)

## 2020-12-13 LAB — GLUCOSE, CAPILLARY
Glucose-Capillary: 158 mg/dL — ABNORMAL HIGH (ref 70–99)
Glucose-Capillary: 168 mg/dL — ABNORMAL HIGH (ref 70–99)
Glucose-Capillary: 227 mg/dL — ABNORMAL HIGH (ref 70–99)
Glucose-Capillary: 182 mg/dL — ABNORMAL HIGH (ref 70–99)
Glucose-Capillary: 168 mg/dL — ABNORMAL HIGH (ref 70–99)
Glucose-Capillary: 130 mg/dL — ABNORMAL HIGH (ref 70–99)

## 2020-12-13 LAB — BASIC METABOLIC PANEL
Anion gap: 10 (ref 5–15)
BUN: 57 mg/dL — ABNORMAL HIGH (ref 8–23)
CO2: 31 mmol/L (ref 22–32)
Calcium: 8.6 mg/dL — ABNORMAL LOW (ref 8.9–10.3)
Chloride: 105 mmol/L (ref 98–111)
Creatinine, Ser: 1.05 mg/dL (ref 0.61–1.24)
GFR, Estimated: >60 mL/min (ref >60)
Glucose, Bld: 181 mg/dL — ABNORMAL HIGH (ref 70–99)
Potassium: 4.7 mmol/L (ref 3.5–5.1)
Sodium: 146 mmol/L — ABNORMAL HIGH (ref 135–145)

## 2020-12-13 LAB — PHOSPHORUS: Phosphorus: 3.3 mg/dL (ref 2.5–4.6)

## 2020-12-13 LAB — MAGNESIUM: Magnesium: 3 mg/dL — ABNORMAL HIGH (ref 1.7–2.4)

## 2020-12-13 MED ORDER — MIDAZOLAM HCL 2 MG/2ML IJ SOLN
5.0000 mg | Freq: Once | INTRAMUSCULAR | Status: DC
  Filled 2020-12-13: qty 6

## 2020-12-13 MED ORDER — ETOMIDATE 2 MG/ML IV SOLN
40.0000 mg | Freq: Once | INTRAVENOUS | Status: AC
  Administered 2020-12-14: 20 mg via INTRAVENOUS
  Filled 2020-12-13: qty 20

## 2020-12-13 MED ORDER — ALBUTEROL SULFATE (2.5 MG/3ML) 0.083% IN NEBU
2.5000 mg | INHALATION_SOLUTION | RESPIRATORY_TRACT | Status: DC | PRN
  Administered 2020-12-13 – 2020-12-14 (×5): 2.5 mg via RESPIRATORY_TRACT
  Filled 2020-12-13 (×4): qty 3

## 2020-12-13 MED ORDER — SODIUM ZIRCONIUM CYCLOSILICATE 5 G PO PACK
10.0000 g | PACK | Freq: Every day | ORAL | Status: DC
  Administered 2020-12-14: 10 g
  Filled 2020-12-13: qty 2

## 2020-12-13 MED ORDER — VECURONIUM BROMIDE 10 MG IV SOLR
10.0000 mg | Freq: Once | INTRAVENOUS | Status: AC
  Administered 2020-12-14: 10 mg via INTRAVENOUS
  Filled 2020-12-13: qty 10

## 2020-12-13 MED ORDER — IPRATROPIUM-ALBUTEROL 0.5-2.5 (3) MG/3ML IN SOLN
3.0000 mL | RESPIRATORY_TRACT | Status: DC
  Filled 2020-12-13: qty 3

## 2020-12-13 MED ORDER — FENTANYL CITRATE (PF) 100 MCG/2ML IJ SOLN: 200.0000 ug | Freq: Once | INTRAMUSCULAR | Status: DC

## 2020-12-13 MED ORDER — PROPOFOL 10 MG/ML IV BOLUS: 500.0000 mg | Freq: Once | INTRAVENOUS | Status: DC

## 2020-12-13 MED ORDER — ETOMIDATE 2 MG/ML IV SOLN
40.0000 mg | Freq: Once | INTRAVENOUS | Status: DC
  Filled 2020-12-13: qty 20

## 2020-12-13 MED ORDER — FREE WATER
200.0000 mL | Freq: Three times a day (TID) | Status: DC
  Administered 2020-12-13 – 2020-12-15 (×5): 200 mL

## 2020-12-13 MED ORDER — DEXMEDETOMIDINE HCL IN NACL 400 MCG/100ML IV SOLN
0.4000 ug/kg/h | INTRAVENOUS | Status: AC
  Administered 2020-12-13 – 2020-12-14 (×6): 0.6 ug/kg/h via INTRAVENOUS
  Administered 2020-12-15: 0.4 ug/kg/h via INTRAVENOUS
  Administered 2020-12-15 – 2020-12-16 (×2): 0.6 ug/kg/h via INTRAVENOUS
  Administered 2020-12-16: 0.5 ug/kg/h via INTRAVENOUS
  Filled 2020-12-13 (×12): qty 100

## 2020-12-13 MED ORDER — VECURONIUM BROMIDE 10 MG IV SOLR: 10.0000 mg | Freq: Once | INTRAVENOUS | Status: DC

## 2020-12-13 MED ORDER — SENNA 8.6 MG PO TABS
1.0000 | ORAL_TABLET | Freq: Every day | ORAL | Status: DC | PRN
  Administered 2020-12-18: 8.6 mg via ORAL
  Filled 2020-12-13: qty 1

## 2020-12-13 NOTE — Progress Notes (Signed)
PT Cancellation Note  Patient Details Name: Isaac [name] MRN: [id number] DOB: 1956/06/25   Cancelled Treatment:    Reason Eval/Treat Not Completed: Patient not medically ready   Kadajah Kjos B Taejah Ohalloran 12/13/2020, 6:33 AM  [name], PT Acute Rehabilitation Services Pager: [phone] Office: [phone]

## 2020-12-13 NOTE — Progress Notes (Signed)
NAME:  Isaac [name], MRN:  [id number], DOB:  12-30-1955, LOS: 5 ADMISSION DATE:  12/08/2020, CONSULTATION DATE:  12/13/20 REFERRING MD:  EDP, CHIEF COMPLAINT:  Shortness of breath   Brief History:  65 y.o. y/o M with PMH of COPD on 4L home O2, HTN, HL, prior tobacco use who was brought to the ED by EMS for shortness of breath.  Just prior to arriving to the ED, pt became unresponsive and was intubated on arrival.  ABG with severe Hypercapnic respiratory failure and CXR with possible infiltrates  History of Present Illness:  Isaac [name] is a 65 y.o. M with PMH of  COPD on 4L home O2 followed by Dr. Chase Caller, HTN, HL, prior tobacco use who called EMS for worsening shortness of breath.  En route to the ED, he became unresponsive and required intubation on arrival.   Further history obtained from patient's wife, she states that he has been compliant with wearing home O2, does not wear CPAP or Bipap and that his dyspnea started with mild cough 2-3 days ago.  No known fever, but felt warm.  He is vaccinated, but not boosted for Covid-19.  In the ED, workup was significant for hypercapnic respiratory failure with ABG 7.14/101/190/33,  WBC 299k, potassium 6.0 and possibly patchy infiltrates on CXR.  Covid-19 and influenza are negative.  He has had an elevated WBC in the past and was referred to Oncology, he was not able to make his last appointment and has not scheduled a follow up as he was feeling ok.  PCCM consulted for admission  Past Medical History:   has a past medical history of Anxiety, COPD (chronic obstructive pulmonary disease) ([place]), GERD (gastroesophageal reflux disease), Hematuria, Hyperlipidemia, Hypertension, Insomnia, Nephrolithiasis, and Tobacco dependence.  Significant Hospital Events:  1/13 Intubated in ED, PCCM admit  Consults:  Oncology  Procedures:  1/13 ETT >> 1/16; 1/16 >>   Significant Diagnostic Tests:  1/13 CXR >> Diffuse interstitial opacities throughout both  lungs with some additional patchy areas of peripheral consolidation. Findings could reflect atypical infection (including COVID 19) and/or developing pulmonary edema. LE doppler US 1/14 >> negative for DVT  Micro Data:  1/13 Covid-19, Flu >> Negative 1/13 Urine Culture >> negative   Antimicrobials:  Ceftriaxone 1/13 >>1/17 Azithromycin 1/13 >> 1/17  Interim History / Subjective:   Failed extubation again 12/13/2020 due to hypoxia.  He has abdominal chest wall paradoxus even on full mechanical  support.  He has been on oxygen for 10 years.  Plan for tracheostomy 24 to 48 hours.  12/13/2020 wife updated at bedside wants everything done including tracheostomy.   Objective   Blood pressure 97/64, pulse 89, temperature 99.5 F (37.5 C), temperature source Oral, resp. rate 20, height 5\' 10"  (1.778 m), weight 121 kg, SpO2 94 %.    Vent Mode: PRVC FiO2 (%):  [40 %] 40 % Set Rate:  [20 bmp-24 bmp] 20 bmp Vt Set:  [600 mL] 600 mL PEEP:  [5 cmH20] 5 cmH20 Pressure Support:  [12 cmH20] 12 cmH20 Plateau Pressure:  [14 cmH20-23 cmH20] 19 cmH20   Intake/Output Summary (Last 24 hours) at 12/13/2020 1051 Last data filed at 12/13/2020 0600 Gross per 24 hour  Intake 2530.32 ml  Output --  Net 2530.32 ml   Filed Weights   12/11/20 0500 12/12/20 0500 12/13/20 0500  Weight: 121.8 kg 119.9 kg 121 kg   General: Looks older than stated age of 65. HEENT: Endotracheal and gastric tube are in place.  Neuro: Grossly intact no focal defects follows commands CV: Heart sounds are regular PULM: Decreased breath sounds throughout expiratory rhonchi.  Abdominal chest wall paradoxus is noted. Vent pressure regulated volume control FIO2 40% PEEP 5 RATE 20 VT 600  GI: soft, bsx4 active  Extremities: warm/dry,  edema  Skin: no rashes or lesions   Resolved Hospital Problem list   N/A  Assessment & Plan:   # Acute on Chronic Hypoxic and Hypercapnic Respiratory Failure: Severe underlying COPD with last  PFTs in 2015 showing a FEV1/FVC of 29. -Failed extubation on 1/16.  Wheezing improved 1/17.  Okay to retry PSV.  Could consider another extubation attempt if he performed very well.  Otherwise I did like to leave him intubated for another day and then probably retry on 1/18 if no new issues. Failed 1/18  Continue bronchodilators He has completed a 5-day course of antimicrobial therapy Will continue prednisone 40 mg daily for 2 Plan for tracheostomy in the near future    # Chronic Lymphocytic Leukemia:  On chart review, looks like chronic pain syndrome continue did follow-up with Dr. Alen Blew in 2019 and was told at that time he had confirmed CLL.   Appreciate hematology input Continue to monitor  # Acute Kidney Injury: Creatinine improving with adequate urine output # Tumor lysis syndrome: Has received rasburicasex4. Recent Labs  Lab 12/12/20 0149 12/12/20 1849 12/13/20 0705  K 5.0 5.7* 4.7   Appreciate heme-onc's input Monitor potassium status post leukemia IV fluids half-normal at 75 cc an Monitor electrolytes    #Hypernatremia, mild Continue half-normal saline Start free water 12/13/2020 continue monitoring  # Hypotension: Possibly in the setting of sedation, resolved  # Hx of Hypertension  Continue antihypertensives  # Unilateral edema:  Doppler studies were negative for DVT  #Constipation Continue bowel regimen  #Hyperglycemia, no history DM, likely due to steroids CBG (last 3)  Recent Labs    12/12/20 2337 12/13/20 0435 12/13/20 0735  GLUCAP 156* 158* 168*   Sliding scale insulin protocol  Best practice (evaluated daily)  Diet: Tube Feeds  Pain/Anxiety/Delirium protocol (if indicated): Precedex, Fentanyl VAP protocol (if indicated): HOB 30 degrees, suction prn DVT prophylaxis: Lovenox GI prophylaxis: Protonix Glucose control: SSI Mobility: bed rest Disposition: ICU Family Communication: Patient's wife updated at bedside 12/13/2020.  The possibility  of tracheostomy was [place] her at that time.  Goals of Care:  Last date of multidisciplinary goals of care discussion:1/13 Family and staff present:  Summary of discussion:  Follow up goals of care discussion due: 1/20 Code Status: Full code  Labs   CBC: Recent Labs  Lab 12/09/20 0445 12/10/20 0129 12/11/20 0438 12/11/20 2053 12/12/20 0149 12/13/20 0705  WBC 216.1* 194.4* 206.9*  --  263.0* 134.0*  NEUTROABS 13.3* 10.9* 9.4*  --  10.8* 5.7  HGB 10.8* 10.4* 10.3* 12.2* 9.4* 10.1*  HCT 35.6* 36.0* 35.4* 36.0* 37.3* 36.5*  MCV 90.1 91.8 93.4  --  96.4 94.8  PLT PLATELET CLUMPS NOTED ON SMEAR, UNABLE TO ESTIMATE 290 287  --  336 175    Basic Metabolic Panel: Recent Labs  Lab 12/11/20 0438 12/11/20 1630 12/11/20 1930 12/11/20 2053 12/12/20 0149 12/12/20 1849 12/13/20 0705  NA 142  --  145 143 147* 146* 146*  K 6.0*  --  5.5* 5.6* 5.0 5.7* 4.7  CL 101  --  101  --  103 104 105  CO2 31  --  33*  --  34* 32 31  GLUCOSE 206*  --  209*  --  176* 227* 181*  BUN 53*  --  48*  --  56* 59* 57*  CREATININE 1.16  --  1.13  --  1.35* 1.14 1.05  CALCIUM 8.3*  --  8.7*  --  8.7* 8.5* 8.6*  MG 2.8* 2.8*  --   --  3.0* 3.0* 3.0*  PHOS 5.5* 5.6*  --   --  4.5 4.2 3.3   GFR: Estimated Creatinine Clearance: 91.5 mL/min (by C-G formula based on SCr of 1.05 mg/dL). Recent Labs  Lab 12/10/20 0129 12/11/20 0438 12/12/20 0149 12/13/20 0705  WBC 194.4* 206.9* 263.0* 134.0*    Liver Function Tests: No results for input(s): AST, ALT, ALKPHOS, BILITOT, PROT, ALBUMIN in the last 168 hours. No results for input(s): LIPASE, AMYLASE in the last 168 hours. No results for input(s): AMMONIA in the last 168 hours.  ABG    Component Value Date/Time   PHART 7.340 (L) 12/11/2020 2053   PCO2ART 68.3 (HH) 12/11/2020 2053   PO2ART 80 (L) 12/11/2020 2053   HCO3 36.9 (H) 12/11/2020 2053   TCO2 39 (H) 12/11/2020 2053   O2SAT 94.0 12/11/2020 2053     Coagulation Profile: No results for  input(s): INR, PROTIME in the last 168 hours.  Cardiac Enzymes: No results for input(s): CKTOTAL, CKMB, CKMBINDEX, TROPONINI in the last 168 hours.  HbA1C: Hgb A1c MFr Bld  Date/Time Value Ref Range Status  12/08/2020 05:01 AM 6.7 (H) 4.8 - 5.6 % Final    Comment:    (NOTE) Pre diabetes:          5.7%-6.4%  Diabetes:              >6.4%  Glycemic control for   <7.0% adults with diabetes     CBG: Recent Labs  Lab 12/12/20 1632 12/12/20 2028 12/12/20 2337 12/13/20 0435 12/13/20 0735  GLUCAP 201* 156* 156* 158* 168*   App cct 41 min  Richardson Landry Suhan Paci ACNP Acute Care Nurse Practitioner [place] Please consult Amion 12/13/2020, 10:52 AM

## 2020-12-13 NOTE — Progress Notes (Signed)
OT Cancellation Note  Patient Details Name: Isaac [name] MRN: [id number] DOB: 1956-06-04   Cancelled Treatment:    Reason Eval/Treat Not Completed: Patient not medically ready. Will return as schedule allows.  [place], OTR/L Acute Rehab Pager: [phone] Office: [phone] 12/13/2020, 8:01 AM

## 2020-12-13 NOTE — Progress Notes (Signed)
HEMATOLOGY-ONCOLOGY PROGRESS NOTE  SUBJECTIVE: No major clinical changes noted.  He remains intubated after failed extubation trial.  No complaints reported.  No family at bedside.     PHYSICAL EXAMINATION: ECOG PERFORMANCE STATUS: 4 - Bedbound  Vitals:   12/13/20 0819 12/13/20 0820  BP:    Pulse:    Resp:    Temp:    SpO2: 94% 94%   Filed Weights   12/11/20 0500 12/12/20 0500 12/13/20 0500  Weight: 268 lb 8.3 oz (121.8 kg) 264 lb 5.3 oz (119.9 kg) 266 lb 12.1 oz (121 kg)    Intake/Output from previous day: 01/17 0701 - 01/18 0700 In: 3036.3 [I.V.:2076.3; NG/GT:960] Out: -   GENERAL: Intubated but alert.   LABORATORY DATA:  I have reviewed the data as listed CMP Latest Ref Rng & Units 12/12/2020 12/12/2020 12/11/2020  Glucose 70 - 99 mg/dL 227(H) 176(H) -  BUN 8 - 23 mg/dL 59(H) 56(H) -  Creatinine 0.61 - 1.24 mg/dL 1.14 1.35(H) -  Sodium 135 - 145 mmol/L 146(H) 147(H) 143  Potassium 3.5 - 5.1 mmol/L 5.7(H) 5.0 5.6(H)  Chloride 98 - 111 mmol/L 104 103 -  CO2 22 - 32 mmol/L 32 34(H) -  Calcium 8.9 - 10.3 mg/dL 8.5(L) 8.7(L) -  Total Protein 6.5 - 8.1 g/dL - - -  Total Bilirubin 0.3 - 1.2 mg/dL - - -  Alkaline Phos 38 - 126 U/L - - -  AST 15 - 41 U/L - - -  ALT 17 - 63 U/L - - -    Lab Results  Component Value Date   WBC 134.0 (HH) 12/13/2020   HGB 10.1 (L) 12/13/2020   HCT 36.5 (L) 12/13/2020   MCV 94.8 12/13/2020   PLT 219 12/13/2020   NEUTROABS 5.7 12/13/2020      ASSESSMENT AND PLAN: 65 year old man:   1.    CLL presented with lymphocytosis in 2019.  He has not received any active treatment after he felt follow-up since that time.  He was hospitalized with respiratory failure and tumor lysis syndrome.     Laboratory data reviewed today showed decrease in his total white cell count to 134 with stable hemoglobin and platelet count.   Given the stability of his counts at this time including his tumor lysis syndrome, I recommended continued observation  given his respiratory failure.  We will await the results of his flow cytometry and FISH prognostic panel but I doubt it will change acute management.  We will continue to follow closely and consider treating while he is hospitalized if we feel CLL is contributing to his acute illness.  I do not think this is the case at this time.  2.  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  Markers are improving at this time.  His kidney function is normal, calcium remains low and phosphorus has improved.  He still has an element of hyperkalemia.  I recommend continuing allopurinol for the time being.   3. Acute on chronic hypoxic respiratory failure.  Failed extubation trial and currently managed by critical care team.  This is related to a COPD exacerbation less likely to be have exacerbated by his CLL.  We will continue to follow.   25  minutes were dedicated to this visit.  Other than 50% of the time was dedicated to reviewing laboratory data, disease status update and outlining future plan of care.     LOS: 5 days   Zola Button, MD  12/13/20

## 2020-12-14 ENCOUNTER — Inpatient Hospital Stay (HOSPITAL_COMMUNITY): Payer: BC Managed Care – PPO

## 2020-12-14 DIAGNOSIS — J9622 Acute and chronic respiratory failure with hypercapnia: Principal | ICD-10-CM

## 2020-12-14 DIAGNOSIS — Z9911 Dependence on respirator [ventilator] status: Secondary | ICD-10-CM

## 2020-12-14 DIAGNOSIS — J9621 Acute and chronic respiratory failure with hypoxia: Secondary | ICD-10-CM

## 2020-12-14 LAB — BASIC METABOLIC PANEL
Anion gap: 9 (ref 5–15)
BUN: 52 mg/dL — ABNORMAL HIGH (ref 8–23)
CO2: 33 mmol/L — ABNORMAL HIGH (ref 22–32)
Calcium: 9 mg/dL (ref 8.9–10.3)
Chloride: 106 mmol/L (ref 98–111)
Creatinine, Ser: 0.94 mg/dL (ref 0.61–1.24)
GFR, Estimated: >60 mL/min (ref >60)
Glucose, Bld: 139 mg/dL — ABNORMAL HIGH (ref 70–99)
Potassium: 4.7 mmol/L (ref 3.5–5.1)
Sodium: 148 mmol/L — ABNORMAL HIGH (ref 135–145)

## 2020-12-14 LAB — PHOSPHORUS: Phosphorus: 3.7 mg/dL (ref 2.5–4.6)

## 2020-12-14 LAB — MAGNESIUM: Magnesium: 2.8 mg/dL — ABNORMAL HIGH (ref 1.7–2.4)

## 2020-12-14 LAB — PROTIME-INR
INR: 1.1 (ref 0.8–1.2)
Prothrombin Time: 14.1 seconds (ref 11.4–15.2)

## 2020-12-14 LAB — GLUCOSE, CAPILLARY
Glucose-Capillary: 143 mg/dL — ABNORMAL HIGH (ref 70–99)
Glucose-Capillary: 138 mg/dL — ABNORMAL HIGH (ref 70–99)
Glucose-Capillary: 207 mg/dL — ABNORMAL HIGH (ref 70–99)
Glucose-Capillary: 173 mg/dL — ABNORMAL HIGH (ref 70–99)
Glucose-Capillary: 125 mg/dL — ABNORMAL HIGH (ref 70–99)
Glucose-Capillary: 142 mg/dL — ABNORMAL HIGH (ref 70–99)

## 2020-12-14 LAB — APTT: aPTT: 25 seconds (ref 24–36)

## 2020-12-14 MED ORDER — FENTANYL BOLUS VIA INFUSION
200.0000 ug | Freq: Once | INTRAVENOUS | Status: AC
  Administered 2020-12-14: 200 ug via INTRAVENOUS

## 2020-12-14 MED ORDER — BUDESONIDE 0.5 MG/2ML IN SUSP
0.5000 mg | Freq: Two times a day (BID) | RESPIRATORY_TRACT | Status: DC
  Administered 2020-12-14 – 2020-12-19 (×12): 0.5 mg via RESPIRATORY_TRACT
  Filled 2020-12-14 (×12): qty 2

## 2020-12-14 MED ORDER — MIDAZOLAM HCL 2 MG/2ML IJ SOLN: 5.0000 mg | Freq: Once | INTRAMUSCULAR | Status: DC

## 2020-12-14 MED ORDER — REVEFENACIN 175 MCG/3ML IN SOLN
175.0000 ug | Freq: Every day | RESPIRATORY_TRACT | Status: DC
  Administered 2020-12-14 – 2020-12-18 (×5): 175 ug via RESPIRATORY_TRACT
  Filled 2020-12-14 (×7): qty 3

## 2020-12-14 MED ORDER — PREDNISONE 20 MG PO TABS
20.0000 mg | ORAL_TABLET | Freq: Every day | ORAL | Status: DC
  Administered 2020-12-15 – 2020-12-19 (×5): 20 mg
  Filled 2020-12-14 (×5): qty 1

## 2020-12-14 MED ORDER — ALLOPURINOL 100 MG PO TABS
100.0000 mg | ORAL_TABLET | Freq: Every day | ORAL | Status: DC
  Administered 2020-12-15 – 2020-12-19 (×5): 100 mg
  Filled 2020-12-14 (×5): qty 1

## 2020-12-14 MED ORDER — METOPROLOL TARTRATE 25 MG/10 ML ORAL SUSPENSION
12.5000 mg | Freq: Two times a day (BID) | ORAL | Status: DC
  Administered 2020-12-14 – 2020-12-19 (×11): 12.5 mg
  Filled 2020-12-14 (×11): qty 10

## 2020-12-14 MED ORDER — ARFORMOTEROL TARTRATE 15 MCG/2ML IN NEBU
15.0000 ug | INHALATION_SOLUTION | Freq: Two times a day (BID) | RESPIRATORY_TRACT | Status: DC
  Administered 2020-12-14 – 2020-12-19 (×12): 15 ug via RESPIRATORY_TRACT
  Filled 2020-12-14 (×14): qty 2

## 2020-12-14 MED ORDER — MIDAZOLAM HCL 2 MG/2ML IJ SOLN
6.0000 mg | Freq: Once | INTRAMUSCULAR | Status: AC
  Administered 2020-12-14: 6 mg via INTRAVENOUS

## 2020-12-14 MED ORDER — ETOMIDATE 2 MG/ML IV SOLN: 40.0000 mg | Freq: Once | INTRAVENOUS | Status: DC

## 2020-12-14 MED ORDER — VECURONIUM BROMIDE 10 MG IV SOLR: 10.0000 mg | Freq: Once | INTRAVENOUS | Status: DC

## 2020-12-14 MED ORDER — FENTANYL CITRATE (PF) 100 MCG/2ML IJ SOLN
200.0000 ug | Freq: Once | INTRAMUSCULAR | Status: DC
Start: 2020-12-14 — End: 2020-12-15

## 2020-12-14 NOTE — Progress Notes (Signed)
NAME:  Isaac [name], MRN:  [id number], DOB:  08-30-1956, LOS: 6 ADMISSION DATE:  12/08/2020, CONSULTATION DATE:  12/14/20 REFERRING MD:  EDP, CHIEF COMPLAINT:  Shortness of breath   Brief History:  65 yo male former smoker with severe COPD brought to ER with progressive dyspnea.  Developed altered mental status and required intubation in ER.  Found to have COPD exacerbation with acute on chronic hypoxic/hypercapnic respiratory failure.  Past Medical History:  COPD, Chronic respiratory failure on 4 liters, Anxiety, GERD, HLD, HTN, Nephrolithiasis, Insomnia, CLL  Significant Hospital Events:  1/13 admit 1/16 failed extubation trial  Consults:  Oncology  Procedures:  1/13 ETT >> 1/16 1/16 ETT >>   Significant Diagnostic Tests:   Echo 1/13 >> EF 60 to 65%  Micro Data:  1/13 Covid-19, Flu >> Negative 1/13 MRSA PCR >> Negative 1/13 Urine Culture >> negative   Antimicrobials:  Ceftriaxone 1/12 >> 1/16 Azithromycin 1/12 >> 1/16  Interim History / Subjective:  Remains on vent support, sedation.  Objective   Blood pressure (!) 154/87, pulse (!) 103, temperature 98.1 F (36.7 C), temperature source Oral, resp. rate 20, height 5\' 10"  (1.778 m), weight 121 kg, SpO2 94 %.    Vent Mode: PRVC FiO2 (%):  [40 %] 40 % Set Rate:  [20 bmp] 20 bmp Vt Set:  [600 mL] 600 mL PEEP:  [5 cmH20] 5 cmH20 Plateau Pressure:  [19 cmH20-26 cmH20] 21 cmH20   Intake/Output Summary (Last 24 hours) at 12/14/2020 0850 Last data filed at 12/14/2020 0800 Gross per 24 hour  Intake 3212.81 ml  Output 550 ml  Net 2662.81 ml   Filed Weights   12/12/20 0500 12/13/20 0500 12/13/20 1217  Weight: 119.9 kg 121 kg 121 kg    General - sedated Eyes - pupils reactive ENT - ETT in place Cardiac - regular rate/rhythm, no murmur Chest - decreased BS, scattered rhonchi, no wheeze Abdomen - soft, non tender, + bowel sounds Extremities - no cyanosis, clubbing, or edema Skin - no rashes Neuro - RASS  0  Resolved Hospital Problem list     Assessment & Plan:   Acute on chronic hypoxic/hypercapnic respiratory failure from COPD exacerbation. Failure to wean from ventilator. - tracheostomy planned for PM of 1/19 - f/u CXR intermittently - brovana, pulmicort, yupelri with prn albuterol - change prednisone to 20 mg daily - likely will need LTAC  Acute metabolic encephalopathy 2nd to hypoxia/hypercapnia. - RASS goal 0 to -1  Chronic lymphocytic leukemia. Tumor lysis syndrome. - followed by Dr. Alen Blew with hematology/oncology - continue allopurinol  Hx of HTN. - resume lopressor - hold outpt catapres, lopressor, valsartan, HCTZ  Constipation. - bowel regimen  Steroid induced hyperglycemia. - SSI  Anemia of chronic disease and critical illness. - f/u CBC - transfuse for Hb < 7  Best practice (evaluated daily)  Diet: Tube Feeds  Pain/Anxiety/Delirium protocol (if indicated): Precedex, Fentanyl VAP protocol (if indicated): HOB 30 degrees, suction prn DVT prophylaxis: Lovenox GI prophylaxis: Protonix Mobility: bed rest Disposition: ICU Family Communication: Patient's wife updated at bedside 12/13/2020.  Goals of Care:  Last date of multidisciplinary goals of care discussion:1/13 Family and staff present:  Summary of discussion:  Follow up goals of care discussion due: 1/20 Code Status: Full code  Labs    CMP Latest Ref Rng & Units 12/14/2020 12/13/2020 12/12/2020  Glucose 70 - 99 mg/dL 139(H) 181(H) 227(H)  BUN 8 - 23 mg/dL 52(H) 57(H) 59(H)  Creatinine 0.61 - 1.24 mg/dL 0.94  1.05 1.14  Sodium 135 - 145 mmol/L 148(H) 146(H) 146(H)  Potassium 3.5 - 5.1 mmol/L 4.7 4.7 5.7(H)  Chloride 98 - 111 mmol/L 106 105 104  CO2 22 - 32 mmol/L 33(H) 31 32  Calcium 8.9 - 10.3 mg/dL 9.0 8.6(L) 8.5(L)  Total Protein 6.5 - 8.1 g/dL - - -  Total Bilirubin 0.3 - 1.2 mg/dL - - -  Alkaline Phos 38 - 126 U/L - - -  AST 15 - 41 U/L - - -  ALT 17 - 63 U/L - - -    CBC Latest Ref Rng  & Units 12/13/2020 12/12/2020 12/11/2020  WBC 4.0 - 10.5 K/uL 134.0(HH) 263.0(HH) -  Hemoglobin 13.0 - 17.0 g/dL 10.1(L) 9.4(L) 12.2(L)  Hematocrit 39.0 - 52.0 % 36.5(L) 37.3(L) 36.0(L)  Platelets 150 - 400 K/uL 219 336 -    ABG    Component Value Date/Time   PHART 7.340 (L) 12/11/2020 2053   PCO2ART 68.3 ([place]) 12/11/2020 2053   PO2ART 80 (L) 12/11/2020 2053   HCO3 36.9 (H) 12/11/2020 2053   TCO2 39 (H) 12/11/2020 2053   O2SAT 94.0 12/11/2020 2053    CBG (last 3)  Recent Labs    12/13/20 2306 12/14/20 0311 12/14/20 0744  GLUCAP 130* 143* 138*    Critical care time: 32 minutes  Chesley Mires, MD [place] Pager - [phone] 12/14/2020, 9:05 AM

## 2020-12-14 NOTE — Procedures (Signed)
Diagnostic Bronchoscopy  DREY SHAFF  [id number]  1956-11-07  Date:12/14/20  Time:5:54 PM   Provider Performing:Ambrielle Kington C Tamala Julian   Procedure: Diagnostic Bronchoscopy (65537)  Indication(s) Assist with direct visualization of tracheostomy placement  Consent Risks of the procedure as well as the alternatives and risks of each were explained to the patient and/or caregiver.  Consent for the procedure was obtained.   Anesthesia See separate tracheostomy note   Time Out Verified patient identification, verified procedure, site/side was marked, verified correct patient position, special equipment/implants available, medications/allergies/relevant history reviewed, required imaging and test results available.   Sterile Technique Usual hand hygiene, masks, gowns, and gloves were used   Procedure Description Bronchoscope advanced through endotracheal tube and into airway.  After suctioning out tracheal secretions, bronchoscope used to provide direct visualization of tracheostomy placement.   Complications/Tolerance None; patient tolerated the procedure well.   EBL None  Specimen(s) None

## 2020-12-14 NOTE — Progress Notes (Signed)
Events noted in the last 24 hours.  Laboratory data reviewed.  Tumor lysis markers appear to have stabilized with normal creatinine, potassium and calcium as well as phosphorus.  No additional intervention is needed at this time.  CLL treatment will be deferred given his ongoing respiratory failure issues and unable to wean from the vent.  I see no urgent indication to treat CLL at this time.  We will continue to monitor.

## 2020-12-14 NOTE — Progress Notes (Signed)
[facility] PCCM  Tried to call wife for update, no answer. Left detailed message, specifically mentioning the granulation tissue we noted in his trachea. Will attempt to contact her to find out if he has had surgery there before or if she has any other explanation for it. Do not feel this was malignant, favor trauma/inflammatory change from recent intubations. Nonetheless needs attention after hospital discharge with imaging.  Roselie Awkward, MD [place] PCCM Pager: [phone] Cell: [phone] If no response, call [phone]

## 2020-12-14 NOTE — Progress Notes (Signed)
RT assissted MD x2 with bronchoscopy and tracheostomy procedures. #8 Shiley placed. RN at bedside. No complications. RT will continue to monitor.

## 2020-12-14 NOTE — Procedures (Signed)
Cortrak  Tube Type:  Cortrak - 43 inches Tube Location:  Left nare Initial Placement:  Stomach Secured by: Bridle Technique Used to Measure Tube Placement:  Documented cm marking at nare/ corner of mouth Cortrak Secured At:  75 cm    Cortrak Tube Team Note:  Consult received to place a Cortrak feeding tube.   No x-ray is required. RN may begin using tube.   If the tube becomes dislodged please keep the tube and contact the Cortrak team at [email or web address] (password [email or web address]) for replacement.  If after hours and replacement cannot be delayed, place a NG tube and confirm placement with an abdominal x-ray.    Koleen Distance MS, RD, LDN Please refer to [facility] for RD and/or RD on-call/weekend/after hours pager

## 2020-12-14 NOTE — Procedures (Signed)
Percutaneous Tracheostomy Procedure Note   Isaac [name]  [id number]  Jan 14, 1956  Date:12/14/20  Time:3:33 PM   Provider Performing:Brent Jailene Cupit  Procedure: Percutaneous Tracheostomy with Bronchoscopic Guidance (06237)  Indication(s) Acute respiratory failure with hypoxemia, failure to wean from mechanical ventilation  Consent Risks of the procedure as well as the alternatives and risks of each were explained to the patient and/or caregiver.  Consent for the procedure was obtained.  Anesthesia Etomidate, Versed, Fentanyl, Vecuronium   Time Out Verified patient identification, verified procedure, site/side was marked, verified correct patient position, special equipment/implants available, medications/allergies/relevant history reviewed, required imaging and test results available.   Sterile Technique Maximal sterile technique including sterile barrier drape, hand hygiene, sterile gown, sterile gloves, mask, hair covering.    Procedure Description Appropriate anatomy identified by palpation.  Patient's neck prepped and draped in sterile fashion.  1% lidocaine with epinephrine was used to anesthetize skin overlying neck.  1.5cm incision made and blunt dissection performed until tracheal rings could be easily palpated.   Then a size 8.0 Shiley tracheostomy was placed under bronchoscopic visualization using usual Seldinger technique and serial dilation.   Bronchoscope confirmed placement above the carina.   Upon bronchoscopic inspection an area of granulation tissue was noted in the anterior trachea between the cricoid cartilage and tracheal ring.  Tracheostomy was sutured in place with adhesive pad to protect skin under pressure.  Patient connected to ventilator.   Complications/Tolerance None; patient tolerated the procedure well. Chest X-ray is ordered to confirm no post-procedural complication.   EBL Minimal   Specimen(s) None   Roselie Awkward, MD [place]  PCCM Pager: [phone] Cell: [phone] If no response, call [phone]

## 2020-12-15 LAB — GLUCOSE, CAPILLARY
Glucose-Capillary: 147 mg/dL — ABNORMAL HIGH (ref 70–99)
Glucose-Capillary: 143 mg/dL — ABNORMAL HIGH (ref 70–99)
Glucose-Capillary: 155 mg/dL — ABNORMAL HIGH (ref 70–99)
Glucose-Capillary: 148 mg/dL — ABNORMAL HIGH (ref 70–99)
Glucose-Capillary: 119 mg/dL — ABNORMAL HIGH (ref 70–99)
Glucose-Capillary: 123 mg/dL — ABNORMAL HIGH (ref 70–99)

## 2020-12-15 LAB — CBC
HCT: 35.5 % — ABNORMAL LOW (ref 39.0–52.0)
Hemoglobin: 9.8 g/dL — ABNORMAL LOW (ref 13.0–17.0)
MCH: 26.2 pg (ref 26.0–34.0)
MCHC: 27.6 g/dL — ABNORMAL LOW (ref 30.0–36.0)
MCV: 94.9 fL (ref 80.0–100.0)
Platelets: 243 10*3/uL (ref 150–400)
RBC: 3.74 MIL/uL — ABNORMAL LOW (ref 4.22–5.81)
RDW: 13.9 % (ref 11.5–15.5)
WBC: 139.1 10*3/uL (ref 4.0–10.5)
nRBC: 0 % (ref 0.0–0.2)

## 2020-12-15 LAB — BASIC METABOLIC PANEL
Anion gap: 11 (ref 5–15)
BUN: 44 mg/dL — ABNORMAL HIGH (ref 8–23)
CO2: 31 mmol/L (ref 22–32)
Calcium: 8.9 mg/dL (ref 8.9–10.3)
Chloride: 108 mmol/L (ref 98–111)
Creatinine, Ser: 0.98 mg/dL (ref 0.61–1.24)
GFR, Estimated: >60 mL/min (ref >60)
Glucose, Bld: 143 mg/dL — ABNORMAL HIGH (ref 70–99)
Potassium: 4.9 mmol/L (ref 3.5–5.1)
Sodium: 150 mmol/L — ABNORMAL HIGH (ref 135–145)

## 2020-12-15 MED ORDER — VITAL HIGH PROTEIN PO LIQD: 1000.0000 mL | ORAL | Status: DC

## 2020-12-15 MED ORDER — FREE WATER
200.0000 mL | Status: DC
  Administered 2020-12-15 – 2020-12-17 (×11): 200 mL

## 2020-12-15 MED ORDER — ENOXAPARIN SODIUM 60 MG/0.6ML ~~LOC~~ SOLN
60.0000 mg | SUBCUTANEOUS | Status: DC
  Administered 2020-12-15 – 2020-12-19 (×5): 60 mg via SUBCUTANEOUS
  Filled 2020-12-15 (×5): qty 0.6

## 2020-12-15 MED ORDER — PROSOURCE TF PO LIQD
45.0000 mL | Freq: Every day | ORAL | Status: DC
  Administered 2020-12-16 – 2020-12-19 (×4): 45 mL
  Filled 2020-12-15 (×4): qty 45

## 2020-12-15 NOTE — Progress Notes (Signed)
Nutrition Follow-up  DOCUMENTATION CODES:   Obesity unspecified  INTERVENTION:  Continue TF via Cortrak: Vital High Protein at 70 ml/h (1680 ml per day) 29m Prosource TF daily 2064mfree water Q4H  Provides 1720 kcal, 157 gm protein, 1404 ml free water daily (260437motal free water with flushes)  NUTRITION DIAGNOSIS:   Inadequate oral intake related to inability to eat as evidenced by NPO status. - ongoing  GOAL:   Provide needs based on ASPEN/SCCM guidelines - met with TF  MONITOR:   Vent status,Labs,TF tolerance  REASON FOR ASSESSMENT:   Ventilator,Consult Enteral/tube feeding initiation and management  ASSESSMENT:   [age] male admitted with worsening SOB. He required intubation in the ED. PMH includes HLD, tobacco dependence, nephrolithiasis, COPD, GERD, HTN.   1/13 intubated 1/16 failed extubation trial 1/19 trach; Cortrak placed (gastric tip)  Patient remains intubated on ventilator support MV: 11.7 L/min Temp (24hrs), Avg:98.6 F (37 C), Min:98.4 F (36.9 C), Max:98.7 F (37.1 C)  Pt receiving TF via Cortrak. Current TF: Vital High Protein @ 32m48m, 45ml30msource TF QID, 200ml 30m water   Admit wt: 125 kg Current wt: 124.4 kg  UOP: 1800ml x67mours  Labs reviewed. Na 150 (H), CBGs: [id number]: colace, novolog Q4H, miralax, deltasone  Diet Order:   Diet Order            Diet NPO time specified  Diet effective midnight                 EDUCATION NEEDS:   Not appropriate for education at this time  Skin:  Skin Assessment: Reviewed RN Assessment  Last BM:  12/15/20 type 5  Height:   Ht Readings from Last 1 Encounters:  12/08/20 '5\' 10"'  (1.778 m)    Weight:   Wt Readings from Last 1 Encounters:  12/15/20 124.4 kg    Ideal Body Weight:  75.5 kg  BMI:  Body mass index is 39.35 kg/m.  Estimated Nutritional Needs:   Kcal:  [id number]:  >/= 150 gm  Fluid:  1.8-2 L  Ruchel Brandenburger Larkin InaD, LDN RD  pager number and weekend/on-call pager number located in Amion.[place]

## 2020-12-15 NOTE — Progress Notes (Signed)
No major clinical changes noted.  Laboratory data reviewed and continues to show stable CBC with leukocytosis consistent with his CLL and stable hemoglobin and platelet count.  Tumor lysis markers has normalized at this time and his allopurinol switched to daily dosing.  I recommend continuing this for the time being given his propensity to develop tumor lysis in the future.  At this time, I see no indication for acute treatment of CLL.  We will arrange outpatient follow-up upon his discharge.  Please call with any questions in the interim.

## 2020-12-15 NOTE — Progress Notes (Signed)
NAME:  Isaac [name], MRN:  [id number], DOB:  1955-12-30, LOS: 7 ADMISSION DATE:  12/08/2020, CONSULTATION DATE:  12/15/20 REFERRING MD:  EDP, CHIEF COMPLAINT:  Shortness of breath   Brief History:  65 yo male former smoker with severe COPD brought to ER with progressive dyspnea.  Developed altered mental status and required intubation in ER.  Found to have COPD exacerbation with acute on chronic hypoxic/hypercapnic respiratory failure.  Past Medical History:  COPD, Chronic respiratory failure on 4 liters, Anxiety, GERD, HLD, HTN, Nephrolithiasis, Insomnia, CLL  Significant Hospital Events:  1/13 admit 1/16 failed extubation trial 1/19 trach  Consults:  Oncology  Procedures:  1/13 ETT >> 1/16 1/16 ETT >> 1/19 1/19 trach >>   Significant Diagnostic Tests:   Echo 1/13 >> EF 60 to 65%  Micro Data:  1/13 Covid-19, Flu >> Negative 1/13 MRSA PCR >> Negative 1/13 Urine Culture >> negative   Antimicrobials:  Ceftriaxone 1/12 >> 1/16 Azithromycin 1/12 >> 1/16  Interim History / Subjective:  Denies chest/abd pain.  Objective   Blood pressure 118/71, pulse 79, temperature 98.7 F (37.1 C), temperature source Oral, resp. rate 20, height 5\' 10"  (1.778 m), weight 124.4 kg, SpO2 98 %.    Vent Mode: PRVC FiO2 (%):  [40 %-60 %] 60 % Set Rate:  [20 bmp] 20 bmp Vt Set:  [600 mL] 600 mL PEEP:  [5 cmH20] 5 cmH20 Plateau Pressure:  [15 cmH20-24 cmH20] 17 cmH20   Intake/Output Summary (Last 24 hours) at 12/15/2020 8676 Last data filed at 12/15/2020 0600 Gross per 24 hour  Intake 3085.28 ml  Output 1800 ml  Net 1285.28 ml   Filed Weights   12/13/20 1217 12/14/20 1154 12/15/20 0446  Weight: 121 kg 121 kg 124.4 kg     General - alert Eyes - pupils reactive ENT - trach site clean Cardiac - regular rate/rhythm, no murmur Chest - decreased breath sounds b/l, no wheezing or rales Abdomen - soft, non tender, + bowel sounds Extremities - no cyanosis, clubbing, or edema Skin -  no rashes Neuro - RASS 0  Resolved Hospital Problem list     Assessment & Plan:   Acute on chronic hypoxic/hypercapnic respiratory failure from COPD exacerbation. Failure to wean from ventilator s/p tracheostomy 1/19. - pressure support wean as able  - f/u CXR intermittently - brovana, pulmicort, yupelri with prn albuterol - wean off prednisone as tolerated - will start process to assess for LTAC  Acute metabolic encephalopathy 2nd to hypoxia/hypercapnia. - RASS goal 0  Chronic lymphocytic leukemia. Tumor lysis syndrome. - followed by Dr. Alen Blew with hematology/oncology - continue allopurinol  Hx of HTN. - continue lopressor - hold outpt catapres, lopressor, valsartan, HCTZ  Hypernatremia. - increase free water to 200 ml q4h - f/u BMET  Constipation. - bowel regimen  Steroid induced hyperglycemia. - SSI  Anemia of chronic disease and critical illness. - f/u CBC intermittently - transfuse for Hb < 7  Best practice (evaluated daily)  Diet: Tube Feeds  Pain/Anxiety/Delirium protocol (if indicated): Precedex, Fentanyl VAP protocol (if indicated): HOB 30 degrees, suction prn DVT prophylaxis: Lovenox GI prophylaxis: Protonix Mobility: PT/OT Disposition: ICU Family Communication: Patient's wife updated at bedside 12/14/2020.  Goals of Care:  Last date of multidisciplinary goals of care discussion:1/13 Family and staff present:  Summary of discussion:  Follow up goals of care discussion due: 1/20 Code Status: Full code  Labs    CMP Latest Ref Rng & Units 12/15/2020 12/14/2020 12/13/2020  Glucose 70 -  99 mg/dL 143(H) 139(H) 181(H)  BUN 8 - 23 mg/dL 44(H) 52(H) 57(H)  Creatinine 0.61 - 1.24 mg/dL 0.98 0.94 1.05  Sodium 135 - 145 mmol/L 150(H) 148(H) 146(H)  Potassium 3.5 - 5.1 mmol/L 4.9 4.7 4.7  Chloride 98 - 111 mmol/L 108 106 105  CO2 22 - 32 mmol/L 31 33(H) 31  Calcium 8.9 - 10.3 mg/dL 8.9 9.0 8.6(L)  Total Protein 6.5 - 8.1 g/dL - - -  Total Bilirubin 0.3  - 1.2 mg/dL - - -  Alkaline Phos 38 - 126 U/L - - -  AST 15 - 41 U/L - - -  ALT 17 - 63 U/L - - -    CBC Latest Ref Rng & Units 12/15/2020 12/13/2020 12/12/2020  WBC 4.0 - 10.5 K/uL 139.1(HH) 134.0(HH) 263.0(HH)  Hemoglobin 13.0 - 17.0 g/dL 9.8(L) 10.1(L) 9.4(L)  Hematocrit 39.0 - 52.0 % 35.5(L) 36.5(L) 37.3(L)  Platelets 150 - 400 K/uL 243 219 336    ABG    Component Value Date/Time   PHART 7.340 (L) 12/11/2020 2053   PCO2ART 68.3 ([place]) 12/11/2020 2053   PO2ART 80 (L) 12/11/2020 2053   HCO3 36.9 (H) 12/11/2020 2053   TCO2 39 (H) 12/11/2020 2053   O2SAT 94.0 12/11/2020 2053    CBG (last 3)  Recent Labs    12/14/20 2309 12/15/20 0315 12/15/20 0723  GLUCAP 142* 147* 143*    Signature:  Chesley Mires, MD [place] Pager - [phone] 12/15/2020, 8:12 AM

## 2020-12-15 NOTE — Evaluation (Signed)
Physical Therapy Evaluation Patient Details Name: Isaac [name] MRN: [id number] DOB: [birth year] Today's Date: 12/15/2020   History of Present Illness  65 yo admitted 1/13 with AMS and hypoxia requiring intubation in ED. Failed extubation 1/16 with reintubation and trach placed 1/19. PMhx: COPD, anxiety, GERD, HLD, HTN, nephrolithiasis, CLL  Clinical Impression  Pt pleasant mouthing words and writing on pad throughout session. Pt reports he spends most of his day in bed but walks to the bathroom. Wife assists with all ADLs and he is limited in gait due to his breathing. Pt able to transition from supine>sitting>BSC>bed today with maintained SpO2 91-97% on 50% FiO2 on vent with HR up to 112. Pt benefits from encouragement and reassurance with mobility. Pt with decreased activity tolerance, strength and function who will benefit from acute therapy to maximize independence.   HR 108    Follow Up Recommendations SNF;Supervision/Assistance - 24 hour    Equipment Recommendations  Rolling walker with 5" wheels;3in1 (PT)    Recommendations for Other Services OT consult     Precautions / Restrictions Precautions Precautions: Fall Precaution Comments: vent, trach      Mobility  Bed Mobility Overal bed mobility: Needs Assistance Bed Mobility: Supine to Sit;Sit to Supine     Supine to sit: HOB elevated;Min assist Sit to supine: Min assist   General bed mobility comments: cues for sequence with assist for lines and safety. Pt required HHA to elevate trunk from surface and cues for safety and scooting to [facility] to return to bed with assist for legs    Transfers Overall transfer level: Needs assistance   Transfers: Sit to/from Stand;Stand Pivot Transfers Sit to Stand: Min assist Stand pivot transfers: Min assist       General transfer comment: min assist to stand from bed x 3 trials with pivot with single UE hooking and left knee blocked to pivot from bed to [facility]. Pt with incontinent  stool during pivot with increased time for linen change and pericare. Pt stood x 2 from [facility] for pericare prior to pivot back to bed as pt denied transfer to chair due to fatigue with grossly 30 min in sitting during session  Ambulation/Gait             General Gait Details: not yet ready  Stairs            Wheelchair Mobility    Modified Rankin (Stroke Patients Only)       Balance Overall balance assessment: Needs assistance   Sitting balance-Leahy Scale: Good Sitting balance - Comments: pt able to sit EOB and BSC without UE support   Standing balance support: Single extremity supported Standing balance-Leahy Scale: Poor Standing balance comment: UE support in standing                             Pertinent Vitals/Pain Pain Assessment: 0-10 Pain Score: 4  Pain Location: trach site Pain Descriptors / Indicators: Sore Pain Intervention(s): Limited activity within patient's tolerance;Repositioned    Home Living Family/patient expects to be discharged to:: Private residence Living Arrangements: Spouse/significant other Available Help at Discharge: Family;Available 24 hours/day Type of Home: House Home Access: Ramped entrance     Home Layout: One level Home Equipment: Wheelchair - manual;Shower seat      Prior Function Level of Independence: Needs assistance   Gait / Transfers Assistance Needed: only walks limited distance to bathroom grossly 15', WC for community  ADL's / Homemaking  Assistance Needed: wife assists with all bathing/ dressing and does the homemaking        Hand Dominance        Extremity/Trunk Assessment   Upper Extremity Assessment Upper Extremity Assessment: Generalized weakness    Lower Extremity Assessment Lower Extremity Assessment: Generalized weakness    Cervical / Trunk Assessment Cervical / Trunk Assessment: Kyphotic  Communication   Communication: Tracheostomy  Cognition Arousal/Alertness:  Awake/alert Behavior During Therapy: WFL for tasks assessed/performed Overall Cognitive Status: Within Functional Limits for tasks assessed                                        General Comments      Exercises     Assessment/Plan    PT Assessment Patient needs continued PT services  PT Problem List Decreased strength;Decreased mobility;Decreased activity tolerance;Decreased balance;Decreased knowledge of use of DME;Cardiopulmonary status limiting activity       PT Treatment Interventions Gait training;Balance training;DME instruction;Therapeutic exercise;Functional mobility training;Therapeutic activities;Patient/family education    PT Goals (Current goals can be found in the Care Plan section)  Acute Rehab PT Goals Patient Stated Goal: be able to return home PT Goal Formulation: With patient Time For Goal Achievement: 12/29/20 Potential to Achieve Goals: Fair    Frequency Min 3X/week   Barriers to discharge Decreased caregiver support      Co-evaluation               AM-PAC PT "6 Clicks" Mobility  Outcome Measure Help needed turning from your back to your side while in a flat bed without using bedrails?: A Little Help needed moving from lying on your back to sitting on the side of a flat bed without using bedrails?: A Little Help needed moving to and from a bed to a chair (including a wheelchair)?: A Little Help needed standing up from a chair using your arms (e.g., wheelchair or bedside chair)?: A Little Help needed to walk in hospital room?: A Lot Help needed climbing 3-5 steps with a railing? : Total 6 Click Score: 15    End of Session Equipment Utilized During Treatment: Gait belt Activity Tolerance: Patient tolerated treatment well Patient left: in bed;with call bell/phone within reach Nurse Communication: Mobility status PT Visit Diagnosis: Other abnormalities of gait and mobility (R26.89);Difficulty in walking, not elsewhere classified  (R26.2);Muscle weakness (generalized) (M62.81)    Time: 1829-9371 PT Time Calculation (min) (ACUTE ONLY): 53 min   Charges:   PT Evaluation $PT Eval High Complexity: 1 High PT Treatments $Therapeutic Activity: 23-37 mins        Elliott Quade P, PT Acute Rehabilitation Services Pager: [phone] Office: [phone]   Yenny Kosa B Ruthann Angulo 12/15/2020, 10:21 AM

## 2020-12-15 NOTE — TOC Progression Note (Signed)
Transition of Care [facility]) - Progression Note    Patient Details  Name: BRANDI ARMATO MRN: [id number] Date of Birth: 09-Jan-1956  Transition of Care [facility]) CM/SW [place], RN Phone Number: 12/15/2020, 8:45 AM  Clinical Narrative:    Patient now ith trach, still with Vent, reached out to LTAC to review for candidacy.   Expected Discharge Plan: [place] Barriers to Discharge: Continued Medical Work up  Expected Discharge Plan and Services Expected Discharge Plan: [place]   Discharge Planning Services: CM Consult Post Acute Care Choice: Durable Medical Equipment (oxygen) Living arrangements for the past 2 months: Single Family Home                                       Social Determinants of Health (SDOH) Interventions    Readmission Risk Interventions No flowsheet data found.

## 2020-12-16 ENCOUNTER — Inpatient Hospital Stay (HOSPITAL_COMMUNITY): Payer: BC Managed Care – PPO

## 2020-12-16 DIAGNOSIS — R601 Generalized edema: Secondary | ICD-10-CM | POA: Diagnosis not present

## 2020-12-16 DIAGNOSIS — J9621 Acute and chronic respiratory failure with hypoxia: Secondary | ICD-10-CM | POA: Diagnosis not present

## 2020-12-16 DIAGNOSIS — J441 Chronic obstructive pulmonary disease with (acute) exacerbation: Secondary | ICD-10-CM | POA: Diagnosis not present

## 2020-12-16 LAB — BASIC METABOLIC PANEL
Anion gap: 9 (ref 5–15)
BUN: 37 mg/dL — ABNORMAL HIGH (ref 8–23)
CO2: 31 mmol/L (ref 22–32)
Calcium: 8.8 mg/dL — ABNORMAL LOW (ref 8.9–10.3)
Chloride: 106 mmol/L (ref 98–111)
Creatinine, Ser: 0.89 mg/dL (ref 0.61–1.24)
GFR, Estimated: >60 mL/min (ref >60)
Glucose, Bld: 139 mg/dL — ABNORMAL HIGH (ref 70–99)
Potassium: 4.5 mmol/L (ref 3.5–5.1)
Sodium: 146 mmol/L — ABNORMAL HIGH (ref 135–145)

## 2020-12-16 LAB — GLUCOSE, CAPILLARY
Glucose-Capillary: 129 mg/dL — ABNORMAL HIGH (ref 70–99)
Glucose-Capillary: 145 mg/dL — ABNORMAL HIGH (ref 70–99)
Glucose-Capillary: 176 mg/dL — ABNORMAL HIGH (ref 70–99)
Glucose-Capillary: 131 mg/dL — ABNORMAL HIGH (ref 70–99)
Glucose-Capillary: 99 mg/dL (ref 70–99)
Glucose-Capillary: 116 mg/dL — ABNORMAL HIGH (ref 70–99)

## 2020-12-16 MED ORDER — FLUTICASONE PROPIONATE 50 MCG/ACT NA SUSP
1.0000 | Freq: Every day | NASAL | Status: DC
  Administered 2020-12-16 – 2020-12-19 (×6): 1 via NASAL
  Filled 2020-12-16: qty 16

## 2020-12-16 MED ORDER — FUROSEMIDE 10 MG/ML IJ SOLN
40.0000 mg | Freq: Once | INTRAMUSCULAR | Status: AC
  Administered 2020-12-16: 40 mg via INTRAVENOUS
  Filled 2020-12-16: qty 4

## 2020-12-16 MED ORDER — ALUM & MAG HYDROXIDE-SIMETH 200-200-20 MG/5ML PO SUSP
15.0000 mL | Freq: Four times a day (QID) | ORAL | Status: DC | PRN
  Administered 2020-12-16 – 2020-12-17 (×2): 15 mL
  Filled 2020-12-16 (×3): qty 30

## 2020-12-16 MED ORDER — DEXMEDETOMIDINE HCL IN NACL 400 MCG/100ML IV SOLN
0.4000 ug/kg/h | INTRAVENOUS | Status: DC
  Administered 2020-12-16: 0.5 ug/kg/h via INTRAVENOUS
  Administered 2020-12-17: 0.4 ug/kg/h via INTRAVENOUS
  Administered 2020-12-17: 0.6 ug/kg/h via INTRAVENOUS
  Administered 2020-12-17: 0.5 ug/kg/h via INTRAVENOUS
  Administered 2020-12-18: 0.6 ug/kg/h via INTRAVENOUS
  Administered 2020-12-18: 0.4 ug/kg/h via INTRAVENOUS
  Administered 2020-12-19 (×2): 0.8 ug/kg/h via INTRAVENOUS
  Administered 2020-12-19: 0.4 ug/kg/h via INTRAVENOUS
  Filled 2020-12-16 (×6): qty 100
  Filled 2020-12-16: qty 200

## 2020-12-16 MED ORDER — VITAL HIGH PROTEIN PO LIQD
1000.0000 mL | ORAL | Status: DC
  Administered 2020-12-16 – 2020-12-19 (×4): 1000 mL
  Filled 2020-12-16 (×4): qty 1000

## 2020-12-16 MED ORDER — ORAL CARE MOUTH RINSE
15.0000 mL | Freq: Four times a day (QID) | OROMUCOSAL | Status: DC
  Administered 2020-12-17 – 2020-12-19 (×12): 15 mL via OROMUCOSAL

## 2020-12-16 NOTE — Progress Notes (Addendum)
NAME:  Isaac [name], MRN:  [id number], DOB:  Sep 30, 1956, LOS: 8 ADMISSION DATE:  12/08/2020, CONSULTATION DATE:  12/16/20 REFERRING MD:  EDP, CHIEF COMPLAINT:  Shortness of breath   Brief History:  65 yo male former smoker with severe COPD brought to ER with progressive dyspnea.  Developed altered mental status and required intubation in ER.  Found to have COPD exacerbation with acute on chronic hypoxic/hypercapnic respiratory failure.  Past Medical History:  COPD, Chronic respiratory failure on 4 liters, Anxiety, GERD, HLD, HTN, Nephrolithiasis, Insomnia, CLL  Significant Hospital Events:  1/13 admit 1/16 failed extubation trial 1/19 trach  Consults:  Oncology  Procedures:  1/13 ETT >> 1/16 1/16 ETT >> 1/19 1/19 trach >>   Significant Diagnostic Tests:   Echo 1/13 >> EF 60 to 65%  Micro Data:  1/13 Covid-19, Flu >> Negative 1/13 MRSA PCR >> Negative 1/13 Urine Culture >> negative   Antimicrobials:  Ceftriaxone 1/12 >> 1/16 Azithromycin 1/12 >> 1/16  Interim History / Subjective:  Awake and alert Remains on 50% FiO2 T Max 99.2 WBC down to 139 from 263 + 15.2 L, very third spaced , 3+ edema to hands and LE bilaterally Na remains 146 despite free water replacement  3.5 L free water deficit Stable from [employer] he feels he has reflux>> TF infusing at 70/hr Complaining of frequent stools   Objective   Blood pressure 124/68, pulse 92, temperature 98.6 F (37 C), temperature source Oral, resp. rate 19, height 5\' 10"  (1.778 m), weight 123.2 kg, SpO2 98 %.    Vent Mode: PRVC FiO2 (%):  [40 %-50 %] 50 % Set Rate:  [20 bmp] 20 bmp Vt Set:  [600 mL] 600 mL PEEP:  [5 cmH20] 5 cmH20 Plateau Pressure:  [16 cmH20-21 cmH20] 16 cmH20   Intake/Output Summary (Last 24 hours) at 12/16/2020 0951 Last data filed at 12/16/2020 0800 Gross per 24 hour  Intake 3480.64 ml  Output 1925 ml  Net 1555.64 ml   Filed Weights   12/14/20 1154 12/15/20 0446 12/16/20  0500  Weight: 121 kg 124.4 kg 123.2 kg     General - alert, awake and interactive Eyes - pupils reactive ENT - trach site clean, No LAD, No JVD Cardiac - S1, S2, regular rate/rhythm, no murmur, rub or gallop Chest - Bilateral chest excursion, decreased breath sounds b/l, no wheezing or rales Abdomen - soft, non tender, + bowel sounds, Body mass index is 38.97 kg/m. Extremities - no cyanosis, clubbing, 3+ BLE, BUE edema Skin - no rashes, lesions, skin is dry and intact Neuro - RASS 0  Resolved Hospital Problem list     Assessment & Plan:   Acute on chronic hypoxic/hypercapnic respiratory failure from COPD exacerbation. Failure to wean from ventilator s/p tracheostomy 1/19. - pressure support wean as able  - f/u CXR intermittently - brovana, pulmicort, yupelri with prn albuterol - wean off prednisone as tolerated - will start process to assess for LTAC  Acute metabolic encephalopathy 2nd to hypoxia/hypercapnia. - RASS goal 0  Chronic lymphocytic leukemia. Tumor lysis syndrome. - followed by Dr. Alen Blew with hematology/oncology - continue allopurinol, per oncology indefinately - Will check uric acid levels daily  Nutrition Feels he has reflux on current TF Plan - Continues on TF at 70 cc/hr>> will decrease to 50 / he and check abdominal film to ensure feeding tube placement - Will check CMET to evaluate albumin - Poor nutrition could be contributing to third spacing and edema ( ?  Intravascularly dry)  Positive fluid balance + 15 L with  3+  BLE and UE edema  Plan CMET  In am to check albumin Consider checking pre-albumin Lasix 40 mg x 1    Hx of HTN. - continue lopressor - hold outpt catapres, lopressor, valsartan, HCTZ  Hypernatremia. Free water deficit is 3.5 L -  free water was increased 1/20  to 200 ml q4h - f/u BMET  Constipation. - bowel regimen>> will hold Miralax as he has had frequent stools  Steroid induced hyperglycemia. CBG's well  comtrolled Plan - CBG's - SSI  Anemia of chronic disease and critical illness. - f/u CBC intermittently - transfuse for Hb < 7  Best practice (evaluated daily)  Diet: Tube Feeds  Pain/Anxiety/Delirium protocol (if indicated): Precedex, Fentanyl VAP protocol (if indicated): HOB 30 degrees, suction prn DVT prophylaxis: Lovenox GI prophylaxis: Protonix Mobility: PT/OT Disposition: ICU Family Communication: Patient's wife updated at bedside 12/14/2020.  Goals of Care:  Last date of multidisciplinary goals of care discussion:1/13 Family and staff present:  Summary of discussion:  Follow up goals of care discussion due: 1/20 Code Status: Full code  Wife called and updated in full 1/21 . All questions asked and answered.   Labs    CMP Latest Ref Rng & Units 12/16/2020 12/15/2020 12/14/2020  Glucose 70 - 99 mg/dL 139(H) 143(H) 139(H)  BUN 8 - 23 mg/dL 37(H) 44(H) 52(H)  Creatinine 0.61 - 1.24 mg/dL 0.89 0.98 0.94  Sodium 135 - 145 mmol/L 146(H) 150(H) 148(H)  Potassium 3.5 - 5.1 mmol/L 4.5 4.9 4.7  Chloride 98 - 111 mmol/L 106 108 106  CO2 22 - 32 mmol/L 31 31 33(H)  Calcium 8.9 - 10.3 mg/dL 8.8(L) 8.9 9.0  Total Protein 6.5 - 8.1 g/dL - - -  Total Bilirubin 0.3 - 1.2 mg/dL - - -  Alkaline Phos 38 - 126 U/L - - -  AST 15 - 41 U/L - - -  ALT 17 - 63 U/L - - -    CBC Latest Ref Rng & Units 12/15/2020 12/13/2020 12/12/2020  WBC 4.0 - 10.5 K/uL 139.1(HH) 134.0(HH) 263.0(HH)  Hemoglobin 13.0 - 17.0 g/dL 9.8(L) 10.1(L) 9.4(L)  Hematocrit 39.0 - 52.0 % 35.5(L) 36.5(L) 37.3(L)  Platelets 150 - 400 K/uL 243 219 336    ABG    Component Value Date/Time   PHART 7.340 (L) 12/11/2020 2053   PCO2ART 68.3 ([place]) 12/11/2020 2053   PO2ART 80 (L) 12/11/2020 2053   HCO3 36.9 (H) 12/11/2020 2053   TCO2 39 (H) 12/11/2020 2053   O2SAT 94.0 12/11/2020 2053    CBG (last 3)  Recent Labs    12/15/20 2316 12/16/20 0326 12/16/20 0733  GLUCAP 123* 129* 145*    Signature:  Magdalen Spatz,  MSN, AGACNP-BC [place] for personal pager 12/16/2020, 9:51 AM

## 2020-12-16 NOTE — TOC Progression Note (Signed)
Transition of Care [facility]) - Progression Note    Patient Details  Name: Isaac [name] MRN: [id number] Date of Birth: 06-Jan-1956  Transition of Care [facility]) CM/SW [place], RN Phone Number: 12/16/2020, 4:50 PM  Clinical Narrative:    Damaris Schooner with [facility] regarding [facility], she will not have a bed until maybe Monday. She spoke to patient and wife. They are interested in this next step and she will continue to follow patient   Expected Discharge Plan: [place] Barriers to Discharge: Continued Medical Work up  Expected Discharge Plan and Services Expected Discharge Plan: [place]   Discharge Planning Services: CM Consult Post Acute Care Choice: Durable Medical Equipment (oxygen) Living arrangements for the past 2 months: Single Family Home                                       Social Determinants of Health (SDOH) Interventions    Readmission Risk Interventions No flowsheet data found.

## 2020-12-16 NOTE — Evaluation (Addendum)
Occupational Therapy Evaluation Patient Details Name: Isaac [name] MRN: [id number] DOB: 1956-08-24 Today's Date: 12/16/2020    History of Present Illness 65 yo admitted 1/13 with AMS and hypoxia requiring intubation in ED. Failed extubation 1/16 with reintubation and trach placed 1/19. PMhx: COPD, anxiety, GERD, HLD, HTN, nephrolithiasis, CLL   Clinical Impression   PTA, pt was living with his wife who assisted pt with LB ADLs and IADLs. Pt currently requiring Min A for UB ADLs, Max A for LB ADLs, and Min-Mod A +2 for functional mobility. Pt presenting with decreased balance, strength, and activity tolerance. Despite fatigue, pt very agreeable to therapy. Pt performing stand pivot to recliner with Min A +2 and then mobility taking four steps forward with Mod A +2 and BUE support; presenting with decreased strength at BLEs and noting knees buckling. Providing education on BUE AROM exercises for strengthening and edema management. Pt would benefit from further acute OT to facilitate safe dc. Recommend dc to SNF for further OT to optimize safety, independence with ADLs, and return to PLOF.   BP supine 118/64 and stable throughout. SpO2 90s on trach vent; FiO2 40% and PEEP 5. HR 78. RR 22 and elevating to 40s with effort.    Follow Up Recommendations  SNF    Equipment Recommendations  Other (comment) (defer to next venue)    Recommendations for Other Services PT consult     Precautions / Restrictions Precautions Precautions: Fall Precaution Comments: vent, trach Restrictions Weight Bearing Restrictions: No      Mobility Bed Mobility Overal bed mobility: Needs Assistance Bed Mobility: Supine to Sit     Supine to sit: Mod assist;HOB elevated     General bed mobility comments: Pt bringing BLES towards EOB. Mod A to elevate trunk.    Transfers Overall transfer level: Needs assistance Equipment used: None Transfers: Sit to/from [facility] Sit to Stand: Min  assist Stand pivot transfers: Min assist       General transfer comment: Min A to power up from lower surface. Initially in standing, pt with slight posterior lean.    Balance Overall balance assessment: Needs assistance Sitting-balance support: No upper extremity supported;Feet supported Sitting balance-Leahy Scale: Good     Standing balance support: No upper extremity supported;During functional activity Standing balance-Leahy Scale: Poor Standing balance comment: Reliant on UE support                           ADL either performed or assessed with clinical judgement   ADL Overall ADL's : Needs assistance/impaired Eating/Feeding: NPO   Grooming: Set up;Supervision/safety   Upper Body Bathing: Minimal assistance;Sitting   Lower Body Bathing: Maximal assistance;Sit to/from stand   Upper Body Dressing : Minimal assistance;Sitting   Lower Body Dressing: Maximal assistance;Sit to/from stand Lower Body Dressing Details (indicate cue type and reason): Max A to don socks due to limited forward flexion at hips Toilet Transfer: Minimal assistance;Stand-pivot;+2 for physical assistance (simulated to recliner) Toilet Transfer Details (indicate cue type and reason): Min A for balance to pivot to recliner         Functional mobility during ADLs: Minimal assistance;Moderate assistance;+2 for physical assistance General ADL Comments: Pt presenting with decreased acitivty tolerance, balance, and strength. Pt performing stand pivot to recliner, taking four steps forward, and then UE exercises     Vision Baseline Vision/History: Wears glasses Wears Glasses: At all times Patient Visual Report: No change from baseline  Perception     Praxis      Pertinent Vitals/Pain Pain Assessment: Faces Faces Pain Scale: Hurts little more Pain Location: Generalized Pain Descriptors / Indicators: Discomfort;Grimacing Pain Intervention(s): Monitored during session;Repositioned      Hand Dominance Right   Extremity/Trunk Assessment Upper Extremity Assessment Upper Extremity Assessment: RUE deficits/detail;LUE deficits/detail RUE Deficits / Details: Increased edema (R>L). Difficulty performing shoulder flexion due to weakness and weight at RUE with edema. Pt abel to perform AROM for elbow, wrist, and hand. RUE Coordination: decreased gross motor LUE Deficits / Details: Edema. Weakness at shoulders. WFL for elbow, wrist, and hands   Lower Extremity Assessment Lower Extremity Assessment: Defer to PT evaluation   Cervical / Trunk Assessment Cervical / Trunk Assessment: Kyphotic   Communication Communication Communication: Tracheostomy   Cognition Arousal/Alertness: Awake/alert Behavior During Therapy: WFL for tasks assessed/performed Overall Cognitive Status: Within Functional Limits for tasks assessed                                 General Comments: Very agreeable to therapy; perseverating on getting his hair cut by the RN.   General Comments  BP supine 118/64 and stable throughout. SpO2 90s on trach vent; FiO2 40% and PEEP 5. HR 78, RR 22    Exercises Exercises: General Upper Extremity General Exercises - Upper Extremity Elbow Flexion: AROM;Both;10 reps;Seated Elbow Extension: AROM;Both;10 reps;Seated Wrist Flexion: AROM;Both;10 reps;Seated Wrist Extension: AROM;Both;10 reps;Seated Digit Composite Flexion: AROM;Both;10 reps;Seated Composite Extension: AROM;Both;10 reps;Seated   Shoulder Instructions      Home Living Family/patient expects to be discharged to:: Private residence Living Arrangements: Spouse/significant other Available Help at Discharge: Family;Available 24 hours/day Type of Home: House Home Access: Ramped entrance     Home Layout: One level     Bathroom Shower/Tub: Teacher, early years/pre: Standard     Home Equipment: Wheelchair - manual;Shower seat          Prior Functioning/Environment  Level of Independence: Needs assistance  Gait / Transfers Assistance Needed: only walks limited distance to bathroom grossly 15', WC for community ADL's / Homemaking Assistance Needed: Wife assisting with LB ADLs. Pt also performing sponge bathes at sink.            OT Problem List: Decreased strength;Decreased activity tolerance;Decreased range of motion;Impaired balance (sitting and/or standing);Decreased safety awareness;Decreased knowledge of use of DME or AE;Decreased knowledge of precautions;Impaired UE functional use      OT Treatment/Interventions: Self-care/ADL training;Energy conservation;DME and/or AE instruction;Therapeutic exercise;Therapeutic activities;Patient/family education    OT Goals(Current goals can be found in the care plan section) Acute Rehab OT Goals Patient Stated Goal: be able to return home OT Goal Formulation: With patient Time For Goal Achievement: 12/30/20 Potential to Achieve Goals: Good  OT Frequency: Min 2X/week   Barriers to D/C:            Co-evaluation              AM-PAC OT "6 Clicks" Daily Activity     Outcome Measure Help from another person eating meals?: Total Help from another person taking care of personal grooming?: A Little Help from another person toileting, which includes using toliet, bedpan, or urinal?: A Lot Help from another person bathing (including washing, rinsing, drying)?: A Lot Help from another person to put on and taking off regular upper body clothing?: A Little Help from another person to put on and taking off regular lower  body clothing?: A Lot 6 Click Score: 13   End of Session Equipment Utilized During Treatment: Gait belt;Oxygen;Other (comment) (vent via trach') Nurse Communication: Mobility status;Other (comment) (Wants his hair cut)  Activity Tolerance: Patient tolerated treatment well Patient left: in chair;with call bell/phone within reach;with chair alarm set  OT Visit Diagnosis: Unsteadiness on  feet (R26.81);Other abnormalities of gait and mobility (R26.89);Muscle weakness (generalized) (M62.81);History of falling (Z91.81);Other (comment)                Time: 1275-1700 OT Time Calculation (min): 43 min Charges:  OT General Charges $OT Visit: 1 Visit OT Evaluation $OT Eval Moderate Complexity: 1 Mod OT Treatments $Self Care/Home Management : 23-37 mins  [place], OTR/L Acute Rehab Pager: [phone] Office: [place] 12/16/2020, 2:46 PM

## 2020-12-17 ENCOUNTER — Inpatient Hospital Stay (HOSPITAL_COMMUNITY): Payer: BC Managed Care – PPO

## 2020-12-17 DIAGNOSIS — C9110 Chronic lymphocytic leukemia of B-cell type not having achieved remission: Secondary | ICD-10-CM | POA: Diagnosis not present

## 2020-12-17 DIAGNOSIS — J9621 Acute and chronic respiratory failure with hypoxia: Secondary | ICD-10-CM | POA: Diagnosis not present

## 2020-12-17 LAB — COMPREHENSIVE METABOLIC PANEL
ALT: 19 U/L (ref 0–44)
AST: 15 U/L (ref 15–41)
Albumin: 2.6 g/dL — ABNORMAL LOW (ref 3.5–5.0)
Alkaline Phosphatase: 52 U/L (ref 38–126)
Anion gap: 11 (ref 5–15)
BUN: 45 mg/dL — ABNORMAL HIGH (ref 8–23)
CO2: 31 mmol/L (ref 22–32)
Calcium: 8.5 mg/dL — ABNORMAL LOW (ref 8.9–10.3)
Chloride: 100 mmol/L (ref 98–111)
Creatinine, Ser: 1.13 mg/dL (ref 0.61–1.24)
GFR, Estimated: >60 mL/min (ref >60)
Glucose, Bld: 143 mg/dL — ABNORMAL HIGH (ref 70–99)
Potassium: 4.1 mmol/L (ref 3.5–5.1)
Sodium: 142 mmol/L (ref 135–145)
Total Bilirubin: 1.1 mg/dL (ref 0.3–1.2)
Total Protein: 4.4 g/dL — ABNORMAL LOW (ref 6.5–8.1)

## 2020-12-17 LAB — GLUCOSE, CAPILLARY
Glucose-Capillary: 149 mg/dL — ABNORMAL HIGH (ref 70–99)
Glucose-Capillary: 116 mg/dL — ABNORMAL HIGH (ref 70–99)
Glucose-Capillary: 155 mg/dL — ABNORMAL HIGH (ref 70–99)
Glucose-Capillary: 153 mg/dL — ABNORMAL HIGH (ref 70–99)
Glucose-Capillary: 101 mg/dL — ABNORMAL HIGH (ref 70–99)
Glucose-Capillary: 125 mg/dL — ABNORMAL HIGH (ref 70–99)

## 2020-12-17 LAB — PHOSPHORUS: Phosphorus: 3.8 mg/dL (ref 2.5–4.6)

## 2020-12-17 LAB — CALCIUM, IONIZED: Calcium, Ionized, Serum: 5 mg/dL (ref 4.5–5.6)

## 2020-12-17 LAB — MAGNESIUM: Magnesium: 2.1 mg/dL (ref 1.7–2.4)

## 2020-12-17 LAB — URIC ACID: Uric Acid, Serum: 5.8 mg/dL (ref 3.7–8.6)

## 2020-12-17 MED ORDER — ACETAMINOPHEN 160 MG/5ML PO SOLN
650.0000 mg | Freq: Four times a day (QID) | ORAL | Status: DC | PRN
  Administered 2020-12-17 – 2020-12-19 (×6): 650 mg
  Filled 2020-12-17 (×7): qty 20.3

## 2020-12-17 MED ORDER — FUROSEMIDE 10 MG/ML IJ SOLN
40.0000 mg | Freq: Two times a day (BID) | INTRAMUSCULAR | Status: DC
  Administered 2020-12-17 – 2020-12-19 (×6): 40 mg via INTRAVENOUS
  Filled 2020-12-17 (×6): qty 4

## 2020-12-17 MED ORDER — CLONIDINE HCL 0.2 MG PO TABS
0.1000 mg | ORAL_TABLET | Freq: Four times a day (QID) | ORAL | Status: DC
  Administered 2020-12-17 – 2020-12-19 (×10): 0.1 mg
  Filled 2020-12-17 (×10): qty 1

## 2020-12-17 MED ORDER — FREE WATER
200.0000 mL | Freq: Three times a day (TID) | Status: DC
  Administered 2020-12-17 – 2020-12-19 (×7): 200 mL

## 2020-12-17 NOTE — Progress Notes (Signed)
NAME:  Isaac [name], MRN:  [id number], DOB:  [birth year], LOS: 9 ADMISSION DATE:  12/08/2020, CONSULTATION DATE:  12/17/20 REFERRING MD:  EDP, CHIEF COMPLAINT:  Shortness of breath   Brief History:  65 yo male former smoker with severe COPD brought to ER with progressive dyspnea.  Developed altered mental status and required intubation in ER.  Found to have COPD exacerbation with acute on chronic hypoxic/hypercapnic respiratory failure.  Past Medical History:  COPD, Chronic respiratory failure on 4 liters, Anxiety, GERD, HLD, HTN, Nephrolithiasis, Insomnia, CLL  Significant Hospital Events:  1/13 admit 1/16 failed extubation trial 1/19 trach  Consults:  Oncology  Procedures:  1/13 ETT >> 1/16 1/16 ETT >> 1/19 1/19 trach >>   Significant Diagnostic Tests:   Echo 1/13 >> EF 60 to 65%  Micro Data:  1/13 Covid-19, Flu >> Negative 1/13 MRSA PCR >> Negative 1/13 Urine Culture >> negative   Antimicrobials:  Ceftriaxone 1/12 >> 1/16 Azithromycin 1/12 >> 1/16  Interim History / Subjective:  Awake and alert Remains on 50% FiO2 T Max 99.2 WBC down to 139 from 263 + 14.8 L, very third spaced , 3+ edema to hands and LE bilaterally Na down to  142 despite free water replacement  0.9  L free water deficit, 3700 UO last 24 Net negative 368 last 24 hours Stable from [employer] he feels he has reflux>> TF infusing at 50/hr Feeding tube confirmed in the diistal stomach Complaining of frequent stools Uric Acid is 5.8 CXR 1/22>> Developing bibasilar pulmonary opacification likely artifactual and related to overlying soft tissue as opposed to developing bibasilar pulmonary infiltrate.Small L Pleural effusion noted 1/21 on KUB  40% FiO2   Objective   Blood pressure (!) 173/84, pulse (!) 103, temperature 99.1 F (37.3 C), temperature source Oral, resp. rate (!) 22, height 5\' 10"  (1.778 m), weight 121 kg, SpO2 95 %.    Vent Mode: PRVC FiO2 (%):  [40 %] 40 % Set  Rate:  [20 bmp] 20 bmp Vt Set:  [600 mL] 600 mL PEEP:  [5 cmH20] 5 cmH20 Pressure Support:  [8 cmH20] 8 cmH20 Plateau Pressure:  [18 cmH20-20 cmH20] 20 cmH20   Intake/Output Summary (Last 24 hours) at 12/17/2020 1209 Last data filed at 12/17/2020 1100 Gross per 24 hour  Intake 2336.46 ml  Output 3570 ml  Net -1233.54 ml   Filed Weights   12/15/20 0446 12/16/20 0500 12/17/20 0600  Weight: 124.4 kg 123.2 kg 121 kg     General - alert, awake and interactive Eyes - pupils equal and reactive ENT - trach site clean, No LAD, No JVD Cardiac - S1, S2, regular rate/rhythm, no murmur, rub or gallop Chest - Bilateral chest excursion, decreased breath sounds b/l, no wheezing or rales, slightly diminished BS on the right Abdomen - soft, non tender, + bowel sounds, Body mass index is 38.28 kg/m. Extremities - no cyanosis, clubbing, 2+ BLE, BUE edema Skin - no rashes, lesions, skin is dry and intact Neuro - RASS +1-2  Resolved Hospital Problem list     Assessment & Plan:   Acute on chronic hypoxic/hypercapnic respiratory failure from COPD exacerbation. Failure to wean from ventilator s/p tracheostomy 1/19. ? Small effusion vs infiltrate per CXR - pressure support wean as able  - f/u CXR intermittently - brovana, pulmicort, yupelri with prn albuterol - wean off prednisone as tolerated - Will get tracheal aspirate for culture - will start process to assess for LTAC  Acute metabolic encephalopathy  2nd to hypoxia/hypercapnia. - RASS goal +1  Chronic lymphocytic leukemia. Tumor lysis syndrome. - followed by Dr. Alen Blew with hematology/oncology - continue allopurinol, per oncology indefinately - Will check uric acid levels daily  Nutrition Feels he has reflux on current TF Plan - Tolerating TF at 50 cc per hour - Feeding tube placement confirmed distal stomach - Albumin 2.6 - Poor nutrition could be contributing to third spacing and edema ( ? Intravascularly dry)  Positive fluid  balance + 15 L with  3+  BLE and UE edema  Plan CMET  In am to check albumin Consider checking pre-albumin Lasix 40 mg BID started 1/22    Hx of HTN. - continue lopressor - hold outpt catapres, lopressor, valsartan, HCTZ  Hypernatremia. Free water deficit is 0.9  -  free water was increased 1/20  to 200 ml q4h - f/u BMET  Constipation. - bowel regimen>> will hold Miralax as he has had frequent stools  Steroid induced hyperglycemia. CBG's well comtrolled Plan - CBG's - SSI  Anemia of chronic disease and critical illness. CBC pending 1/22 - f/u CBC intermittently - transfuse for Hb < 7  Best practice (evaluated daily)  Diet: Tube Feeds  Pain/Anxiety/Delirium protocol (if indicated): Precedex, Fentanyl VAP protocol (if indicated): HOB 30 degrees, suction prn DVT prophylaxis: Lovenox GI prophylaxis: Protonix Mobility: PT/OT Disposition: ICU Family Communication: Patient's wife updated at bedside 12/14/2020.  Goals of Care:  Last date of multidisciplinary goals of care discussion:1/13 Family and staff present:  Summary of discussion:  Follow up goals of care discussion due: 1/20 Code Status: Full code  Wife called and updated in full 1/22 . All questions asked and answered.   Labs    CMP Latest Ref Rng & Units 12/17/2020 12/16/2020 12/15/2020  Glucose 70 - 99 mg/dL 143(H) 139(H) 143(H)  BUN 8 - 23 mg/dL 45(H) 37(H) 44(H)  Creatinine 0.61 - 1.24 mg/dL 1.13 0.89 0.98  Sodium 135 - 145 mmol/L 142 146(H) 150(H)  Potassium 3.5 - 5.1 mmol/L 4.1 4.5 4.9  Chloride 98 - 111 mmol/L 100 106 108  CO2 22 - 32 mmol/L 31 31 31   Calcium 8.9 - 10.3 mg/dL 8.5(L) 8.8(L) 8.9  Total Protein 6.5 - 8.1 g/dL 4.4(L) - -  Total Bilirubin 0.3 - 1.2 mg/dL 1.1 - -  Alkaline Phos 38 - 126 U/L 52 - -  AST 15 - 41 U/L 15 - -  ALT 0 - 44 U/L 19 - -    CBC Latest Ref Rng & Units 12/15/2020 12/13/2020 12/12/2020  WBC 4.0 - 10.5 K/uL 139.1(HH) 134.0(HH) 263.0(HH)  Hemoglobin 13.0 - 17.0 g/dL  9.8(L) 10.1(L) 9.4(L)  Hematocrit 39.0 - 52.0 % 35.5(L) 36.5(L) 37.3(L)  Platelets 150 - 400 K/uL 243 219 336    ABG    Component Value Date/Time   PHART 7.340 (L) 12/11/2020 2053   PCO2ART 68.3 ([place]) 12/11/2020 2053   PO2ART 80 (L) 12/11/2020 2053   HCO3 36.9 (H) 12/11/2020 2053   TCO2 39 (H) 12/11/2020 2053   O2SAT 94.0 12/11/2020 2053    CBG (last 3)  Recent Labs    12/16/20 2320 12/17/20 0346 12/17/20 0745  GLUCAP 116* 149* 116*   CC APP Time : 30 minutes Signature:  Magdalen Spatz, MSN, AGACNP-BC [place] for personal pager 12/17/2020, 12:09 PM

## 2020-12-18 ENCOUNTER — Inpatient Hospital Stay (HOSPITAL_COMMUNITY): Payer: BC Managed Care – PPO

## 2020-12-18 LAB — GLUCOSE, CAPILLARY
Glucose-Capillary: 131 mg/dL — ABNORMAL HIGH (ref 70–99)
Glucose-Capillary: 109 mg/dL — ABNORMAL HIGH (ref 70–99)
Glucose-Capillary: 174 mg/dL — ABNORMAL HIGH (ref 70–99)
Glucose-Capillary: 147 mg/dL — ABNORMAL HIGH (ref 70–99)
Glucose-Capillary: 104 mg/dL — ABNORMAL HIGH (ref 70–99)

## 2020-12-18 LAB — CBC
HCT: 30.8 % — ABNORMAL LOW (ref 39.0–52.0)
Hemoglobin: 9.2 g/dL — ABNORMAL LOW (ref 13.0–17.0)
MCH: 27.2 pg (ref 26.0–34.0)
MCHC: 29.9 g/dL — ABNORMAL LOW (ref 30.0–36.0)
MCV: 91.1 fL (ref 80.0–100.0)
Platelets: 264 10*3/uL (ref 150–400)
RBC: 3.38 MIL/uL — ABNORMAL LOW (ref 4.22–5.81)
RDW: 13.7 % (ref 11.5–15.5)
WBC: 129.3 10*3/uL (ref 4.0–10.5)
nRBC: 0 % (ref 0.0–0.2)

## 2020-12-18 LAB — CBC WITH DIFFERENTIAL/PLATELET
Abs Immature Granulocytes: 0 10*3/uL (ref 0.00–0.07)
Basophils Absolute: 0 10*3/uL (ref 0.0–0.1)
Basophils Relative: 0 %
Eosinophils Absolute: 0 10*3/uL (ref 0.0–0.5)
Eosinophils Relative: 0 %
HCT: 32.7 % — ABNORMAL LOW (ref 39.0–52.0)
Hemoglobin: 9.3 g/dL — ABNORMAL LOW (ref 13.0–17.0)
Lymphocytes Relative: 75 %
Lymphs Abs: 124.6 10*3/uL — ABNORMAL HIGH (ref 0.7–4.0)
MCH: 26.4 pg (ref 26.0–34.0)
MCHC: 28.4 g/dL — ABNORMAL LOW (ref 30.0–36.0)
MCV: 92.9 fL (ref 80.0–100.0)
Monocytes Absolute: 5 10*3/uL — ABNORMAL HIGH (ref 0.1–1.0)
Monocytes Relative: 3 %
Neutro Abs: 36.5 10*3/uL — ABNORMAL HIGH (ref 1.7–7.7)
Neutrophils Relative %: 22 %
Platelets: 283 10*3/uL (ref 150–400)
RBC: 3.52 MIL/uL — ABNORMAL LOW (ref 4.22–5.81)
RDW: 13.7 % (ref 11.5–15.5)
WBC: 166.1 10*3/uL (ref 4.0–10.5)
nRBC: 0 % (ref 0.0–0.2)
nRBC: 0 /100 WBC

## 2020-12-18 LAB — BASIC METABOLIC PANEL
Anion gap: 11 (ref 5–15)
BUN: 44 mg/dL — ABNORMAL HIGH (ref 8–23)
CO2: 33 mmol/L — ABNORMAL HIGH (ref 22–32)
Calcium: 8.8 mg/dL — ABNORMAL LOW (ref 8.9–10.3)
Chloride: 99 mmol/L (ref 98–111)
Creatinine, Ser: 1 mg/dL (ref 0.61–1.24)
GFR, Estimated: >60 mL/min (ref >60)
Glucose, Bld: 147 mg/dL — ABNORMAL HIGH (ref 70–99)
Potassium: 3.7 mmol/L (ref 3.5–5.1)
Sodium: 143 mmol/L (ref 135–145)

## 2020-12-18 LAB — URIC ACID: Uric Acid, Serum: 6.1 mg/dL (ref 3.7–8.6)

## 2020-12-18 LAB — MAGNESIUM: Magnesium: 2.1 mg/dL (ref 1.7–2.4)

## 2020-12-18 MED ORDER — SENNA 8.6 MG PO TABS: 1.0000 | ORAL_TABLET | Freq: Every day | ORAL | Status: DC | PRN

## 2020-12-18 MED ORDER — LORAZEPAM 2 MG/ML IJ SOLN
0.2500 mg | Freq: Once | INTRAMUSCULAR | Status: AC
  Administered 2020-12-18: 0.25 mg via INTRAVENOUS
  Filled 2020-12-18: qty 1

## 2020-12-18 NOTE — Progress Notes (Signed)
Pt was on trach collar for about 2 hrs however was tachypneic in 30s-40s and had to be put back on PS on ventilator. Received a 1 time dose for .25 mg Ativan to help with anxiety.  [name], RN

## 2020-12-18 NOTE — Progress Notes (Incomplete)
NAME:  Isaac [name], MRN:  [id number], DOB:  1956-01-30, LOS: 60 ADMISSION DATE:  12/08/2020, CONSULTATION DATE:  12/18/20 REFERRING MD:  EDP, CHIEF COMPLAINT:  Shortness of breath   Brief History:  65 yo male former smoker with severe COPD brought to ER with progressive dyspnea.  Developed altered mental status and required intubation in ER.  Found to have COPD exacerbation with acute on chronic hypoxic/hypercapnic respiratory failure.  Past Medical History:  COPD, Chronic respiratory failure on 4 liters, Anxiety, GERD, HLD, HTN, Nephrolithiasis, Insomnia, CLL  Significant Hospital Events:  1/13 admit 1/16 failed extubation trial 1/19 trach  Consults:  Oncology  Procedures:  1/13 ETT >> 1/16 1/16 ETT >> 1/19 1/19 trach >>   Significant Diagnostic Tests:   Echo 1/13 >> EF 60 to 65%  Micro Data:  1/13 Covid-19, Flu >> Negative 1/13 MRSA PCR >> Negative 1/13 Urine Culture >> negative   Antimicrobials:  Ceftriaxone 1/12 >> 1/16 Azithromycin 1/12 >> 1/16  Interim History / Subjective:  Awake and alert Tolerated ATC x 2 hours today Anxiety is a bit of an issue States he is tired T Max 98.9 WBC 129.3 ( Down trending) Net + 11.6 L , less third spaced , 1-2 + edema to hands and LE bilaterally, improved Na remains 143 continuing  free water replacement  1.3 L free water deficit Stable from Trach standpoint Uric Acid 6.1 CXR >> images reviewed personally Tracheostomy and feeding tubes in unremarkable position. Dense retrocardiac opacity. More hazy density at the right base. Lung volumes are large. Chronic cardiomegaly.  IMPRESSION: Stable hardware positioning and left more than right airspace disease. COPD   Objective   Blood pressure (!) 157/83, pulse 92, temperature 98.7 F (37.1 C), resp. rate (!) 27, height 5\' 10"  (1.778 m), weight 118.7 kg, SpO2 93 %.    Vent Mode: Stand-by FiO2 (%):  [40 %] 40 % Set Rate:  [20 bmp] 20 bmp Vt Set:  [600 mL] 600  mL PEEP:  [5 cmH20] 5 cmH20 Pressure Support:  [8 cmH20-10 cmH20] 10 cmH20 Plateau Pressure:  [19 cmH20-23 cmH20] 19 cmH20   Intake/Output Summary (Last 24 hours) at 12/18/2020 1315 Last data filed at 12/18/2020 1000 Gross per 24 hour  Intake 1766.79 ml  Output 3565 ml  Net -1798.21 ml   Filed Weights   12/16/20 0500 12/17/20 0600 12/18/20 0500  Weight: 123.2 kg 121 kg 118.7 kg     General - alert, awake and interactive Eyes - pupils reactive, equal ENT - trach site clean, No LAD, No JVD Cardiac - S1, S2, regular rate/rhythm, no murmur, rub or gallop Chest - Bilateral chest excursion, decreased breath sounds b/l, no wheezing or rales Abdomen - soft, non tender, + bowel sounds, Body mass index is 37.55 kg/m. Extremities - no cyanosis, clubbing, 2+ BLE, BUE edema Skin - no rashes, lesions, skin is dry and intact Neuro - RASS +1  Resolved Hospital Problem list     Assessment & Plan:   Acute on chronic hypoxic/hypercapnic respiratory failure from COPD exacerbation. Failure to wean from ventilator s/p tracheostomy 1/19. - pressure support wean as able  - ATC trials - f/u CXR intermittently - brovana, pulmicort, yupelri with prn albuterol - wean off prednisone as tolerated - will start process to assess for LTAC  Acute metabolic encephalopathy 2nd to hypoxia/hypercapnia. - RASS goal +1  Chronic lymphocytic leukemia. Tumor lysis syndrome. - followed by Dr. Alen Blew with hematology/oncology - continue allopurinol, per oncology indefinately - Will  check uric acid levels daily  Nutrition Feels he has reflux on current TF Plan - Continues on TF at 50 cc/hr - Trend bmet - Poor nutrition could be contributing to third spacing and edema ( ? Intravascularly dry)  Positive fluid balance  + 10 L with  1-2 +   BLE and UE edema  Improving with duiresis Plan Continue diuresis  Hx of HTN. - continue lopressor - hold outpt catapres, lopressor, valsartan,  HCTZ  Hypernatremia. Free water deficit is 1.3 L -  free water was increased 1/20  to 200 ml q4h - f/u BMET  Constipation. - bowel regimen>> will hold Miralax as he has had frequent stools  Steroid induced hyperglycemia. CBG's well comtrolled Plan - CBG's - SSI  Anemia of chronic disease and critical illness. - f/u CBC intermittently - transfuse for Hb < 7  Best practice (evaluated daily)  Diet: Tube Feeds  Pain/Anxiety/Delirium protocol (if indicated): Precedex, Fentanyl VAP protocol (if indicated): HOB 30 degrees, suction prn DVT prophylaxis: Lovenox GI prophylaxis: Protonix Mobility: PT/OT Disposition: ICU Family Communication: Patient's wife updated at bedside 12/14/2020.  Goals of Care:  Last date of multidisciplinary goals of care discussion:1/13 Family and staff present:  Summary of discussion:  Follow up goals of care discussion due: 1/20 Code Status: Full code  Wife at bedside  updated in full 1/23. All questions asked and answered.   Labs    CMP Latest Ref Rng & Units 12/18/2020 12/17/2020 12/16/2020  Glucose 70 - 99 mg/dL 147(H) 143(H) 139(H)  BUN 8 - 23 mg/dL 44(H) 45(H) 37(H)  Creatinine 0.61 - 1.24 mg/dL 1.00 1.13 0.89  Sodium 135 - 145 mmol/L 143 142 146(H)  Potassium 3.5 - 5.1 mmol/L 3.7 4.1 4.5  Chloride 98 - 111 mmol/L 99 100 106  CO2 22 - 32 mmol/L 33(H) 31 31  Calcium 8.9 - 10.3 mg/dL 8.8(L) 8.5(L) 8.8(L)  Total Protein 6.5 - 8.1 g/dL - 4.4(L) -  Total Bilirubin 0.3 - 1.2 mg/dL - 1.1 -  Alkaline Phos 38 - 126 U/L - 52 -  AST 15 - 41 U/L - 15 -  ALT 0 - 44 U/L - 19 -    CBC Latest Ref Rng & Units 12/18/2020 12/17/2020 12/15/2020  WBC 4.0 - 10.5 K/uL 129.3(HH) 166.1(HH) 139.1(HH)  Hemoglobin 13.0 - 17.0 g/dL 9.2(L) 9.3(L) 9.8(L)  Hematocrit 39.0 - 52.0 % 30.8(L) 32.7(L) 35.5(L)  Platelets 150 - 400 K/uL 264 283 243    ABG    Component Value Date/Time   PHART 7.340 (L) 12/11/2020 2053   PCO2ART 68.3 ([place]) 12/11/2020 2053   PO2ART 80 (L)  12/11/2020 2053   HCO3 36.9 (H) 12/11/2020 2053   TCO2 39 (H) 12/11/2020 2053   O2SAT 94.0 12/11/2020 2053    CBG (last 3)  Recent Labs    12/18/20 0355 12/18/20 0723 12/18/20 1202  GLUCAP 131* 109* 174*    Signature:  Magdalen Spatz, MSN, AGACNP-BC [place] for personal pager 12/18/2020, 1:15 PM

## 2020-12-19 ENCOUNTER — Other Ambulatory Visit (HOSPITAL_COMMUNITY): Payer: Medicare Other

## 2020-12-19 ENCOUNTER — Inpatient Hospital Stay
Admit: 2020-12-19 | Discharge: 2021-01-16 | Disposition: A | Discharge status: Rehabilitation Facility | Payer: Medicare Other | Source: Ambulatory Visit | Attending: Internal Medicine | Admitting: Internal Medicine

## 2020-12-19 ENCOUNTER — Inpatient Hospital Stay (HOSPITAL_COMMUNITY): Payer: BC Managed Care – PPO

## 2020-12-19 DIAGNOSIS — L89159 Pressure ulcer of sacral region, unspecified stage: Secondary | ICD-10-CM | POA: Diagnosis not present

## 2020-12-19 DIAGNOSIS — J441 Chronic obstructive pulmonary disease with (acute) exacerbation: Secondary | ICD-10-CM | POA: Diagnosis not present

## 2020-12-19 DIAGNOSIS — C9110 Chronic lymphocytic leukemia of B-cell type not having achieved remission: Secondary | ICD-10-CM | POA: Diagnosis not present

## 2020-12-19 DIAGNOSIS — G7281 Critical illness myopathy: Secondary | ICD-10-CM | POA: Diagnosis not present

## 2020-12-19 DIAGNOSIS — R2689 Other abnormalities of gait and mobility: Secondary | ICD-10-CM | POA: Diagnosis not present

## 2020-12-19 DIAGNOSIS — N179 Acute kidney failure, unspecified: Secondary | ICD-10-CM | POA: Diagnosis not present

## 2020-12-19 DIAGNOSIS — Z9911 Dependence on respirator [ventilator] status: Secondary | ICD-10-CM | POA: Diagnosis not present

## 2020-12-19 DIAGNOSIS — J181 Lobar pneumonia, unspecified organism: Secondary | ICD-10-CM | POA: Diagnosis not present

## 2020-12-19 DIAGNOSIS — R279 Unspecified lack of coordination: Secondary | ICD-10-CM | POA: Diagnosis not present

## 2020-12-19 DIAGNOSIS — Z4682 Encounter for fitting and adjustment of non-vascular catheter: Secondary | ICD-10-CM | POA: Diagnosis not present

## 2020-12-19 DIAGNOSIS — R1310 Dysphagia, unspecified: Secondary | ICD-10-CM | POA: Diagnosis not present

## 2020-12-19 DIAGNOSIS — R739 Hyperglycemia, unspecified: Secondary | ICD-10-CM | POA: Diagnosis not present

## 2020-12-19 DIAGNOSIS — Z6836 Body mass index (BMI) 36.0-36.9, adult: Secondary | ICD-10-CM | POA: Diagnosis not present

## 2020-12-19 DIAGNOSIS — G9341 Metabolic encephalopathy: Secondary | ICD-10-CM | POA: Diagnosis not present

## 2020-12-19 DIAGNOSIS — L89152 Pressure ulcer of sacral region, stage 2: Secondary | ICD-10-CM | POA: Diagnosis not present

## 2020-12-19 DIAGNOSIS — J189 Pneumonia, unspecified organism: Secondary | ICD-10-CM | POA: Diagnosis not present

## 2020-12-19 DIAGNOSIS — J159 Unspecified bacterial pneumonia: Secondary | ICD-10-CM | POA: Diagnosis not present

## 2020-12-19 DIAGNOSIS — T85598A Other mechanical complication of other gastrointestinal prosthetic devices, implants and grafts, initial encounter: Secondary | ICD-10-CM

## 2020-12-19 DIAGNOSIS — Z87891 Personal history of nicotine dependence: Secondary | ICD-10-CM | POA: Diagnosis not present

## 2020-12-19 DIAGNOSIS — D638 Anemia in other chronic diseases classified elsewhere: Secondary | ICD-10-CM | POA: Diagnosis not present

## 2020-12-19 DIAGNOSIS — J9620 Acute and chronic respiratory failure, unspecified whether with hypoxia or hypercapnia: Secondary | ICD-10-CM | POA: Diagnosis not present

## 2020-12-19 DIAGNOSIS — J449 Chronic obstructive pulmonary disease, unspecified: Secondary | ICD-10-CM | POA: Diagnosis not present

## 2020-12-19 DIAGNOSIS — I2781 Cor pulmonale (chronic): Secondary | ICD-10-CM | POA: Diagnosis not present

## 2020-12-19 DIAGNOSIS — J90 Pleural effusion, not elsewhere classified: Secondary | ICD-10-CM | POA: Diagnosis not present

## 2020-12-19 DIAGNOSIS — E883 Tumor lysis syndrome: Secondary | ICD-10-CM | POA: Diagnosis not present

## 2020-12-19 DIAGNOSIS — K5900 Constipation, unspecified: Secondary | ICD-10-CM | POA: Diagnosis not present

## 2020-12-19 DIAGNOSIS — J9602 Acute respiratory failure with hypercapnia: Secondary | ICD-10-CM | POA: Diagnosis not present

## 2020-12-19 DIAGNOSIS — I10 Essential (primary) hypertension: Secondary | ICD-10-CM | POA: Diagnosis not present

## 2020-12-19 DIAGNOSIS — R109 Unspecified abdominal pain: Secondary | ICD-10-CM | POA: Diagnosis not present

## 2020-12-19 DIAGNOSIS — E870 Hyperosmolality and hypernatremia: Secondary | ICD-10-CM | POA: Diagnosis not present

## 2020-12-19 DIAGNOSIS — T380X5A Adverse effect of glucocorticoids and synthetic analogues, initial encounter: Secondary | ICD-10-CM | POA: Diagnosis not present

## 2020-12-19 DIAGNOSIS — Z20822 Contact with and (suspected) exposure to covid-19: Secondary | ICD-10-CM | POA: Diagnosis not present

## 2020-12-19 DIAGNOSIS — E46 Unspecified protein-calorie malnutrition: Secondary | ICD-10-CM | POA: Diagnosis not present

## 2020-12-19 DIAGNOSIS — R531 Weakness: Secondary | ICD-10-CM | POA: Diagnosis not present

## 2020-12-19 DIAGNOSIS — E871 Hypo-osmolality and hyponatremia: Secondary | ICD-10-CM | POA: Diagnosis not present

## 2020-12-19 DIAGNOSIS — J440 Chronic obstructive pulmonary disease with acute lower respiratory infection: Secondary | ICD-10-CM | POA: Diagnosis not present

## 2020-12-19 DIAGNOSIS — J9621 Acute and chronic respiratory failure with hypoxia: Secondary | ICD-10-CM | POA: Diagnosis present

## 2020-12-19 DIAGNOSIS — J9601 Acute respiratory failure with hypoxia: Secondary | ICD-10-CM | POA: Diagnosis not present

## 2020-12-19 DIAGNOSIS — R0602 Shortness of breath: Secondary | ICD-10-CM | POA: Diagnosis not present

## 2020-12-19 DIAGNOSIS — E875 Hyperkalemia: Secondary | ICD-10-CM | POA: Diagnosis not present

## 2020-12-19 DIAGNOSIS — E669 Obesity, unspecified: Secondary | ICD-10-CM | POA: Diagnosis not present

## 2020-12-19 DIAGNOSIS — K219 Gastro-esophageal reflux disease without esophagitis: Secondary | ICD-10-CM | POA: Diagnosis not present

## 2020-12-19 DIAGNOSIS — C9111 Chronic lymphocytic leukemia of B-cell type in remission: Secondary | ICD-10-CM | POA: Diagnosis not present

## 2020-12-19 DIAGNOSIS — J9600 Acute respiratory failure, unspecified whether with hypoxia or hypercapnia: Secondary | ICD-10-CM | POA: Diagnosis not present

## 2020-12-19 DIAGNOSIS — Z9981 Dependence on supplemental oxygen: Secondary | ICD-10-CM | POA: Diagnosis not present

## 2020-12-19 DIAGNOSIS — J9622 Acute and chronic respiratory failure with hypercapnia: Secondary | ICD-10-CM | POA: Diagnosis not present

## 2020-12-19 DIAGNOSIS — J9690 Respiratory failure, unspecified, unspecified whether with hypoxia or hypercapnia: Secondary | ICD-10-CM | POA: Diagnosis not present

## 2020-12-19 DIAGNOSIS — J9501 Hemorrhage from tracheostomy stoma: Secondary | ICD-10-CM | POA: Diagnosis not present

## 2020-12-19 DIAGNOSIS — R140 Abdominal distension (gaseous): Secondary | ICD-10-CM | POA: Diagnosis not present

## 2020-12-19 DIAGNOSIS — Z743 Need for continuous supervision: Secondary | ICD-10-CM | POA: Diagnosis not present

## 2020-12-19 DIAGNOSIS — J9611 Chronic respiratory failure with hypoxia: Secondary | ICD-10-CM | POA: Diagnosis not present

## 2020-12-19 DIAGNOSIS — R5381 Other malaise: Secondary | ICD-10-CM | POA: Diagnosis not present

## 2020-12-19 DIAGNOSIS — J439 Emphysema, unspecified: Secondary | ICD-10-CM | POA: Diagnosis not present

## 2020-12-19 DIAGNOSIS — J80 Acute respiratory distress syndrome: Secondary | ICD-10-CM | POA: Diagnosis not present

## 2020-12-19 HISTORY — DX: Pneumonia, unspecified organism: J18.9

## 2020-12-19 HISTORY — DX: Chronic lymphocytic leukemia of B-cell type in remission: C91.11

## 2020-12-19 HISTORY — DX: Chronic obstructive pulmonary disease, unspecified: J44.9

## 2020-12-19 HISTORY — DX: Acute and chronic respiratory failure with hypoxia: J96.21

## 2020-12-19 LAB — CBC
HCT: 34.2 % — ABNORMAL LOW (ref 39.0–52.0)
Hemoglobin: 9.7 g/dL — ABNORMAL LOW (ref 13.0–17.0)
MCH: 26.1 pg (ref 26.0–34.0)
MCHC: 28.4 g/dL — ABNORMAL LOW (ref 30.0–36.0)
MCV: 91.9 fL (ref 80.0–100.0)
Platelets: 273 10*3/uL (ref 150–400)
RBC: 3.72 MIL/uL — ABNORMAL LOW (ref 4.22–5.81)
RDW: 13.5 % (ref 11.5–15.5)
WBC: 121.8 10*3/uL (ref 4.0–10.5)
nRBC: 0 % (ref 0.0–0.2)

## 2020-12-19 LAB — BASIC METABOLIC PANEL
Anion gap: 12 (ref 5–15)
BUN: 39 mg/dL — ABNORMAL HIGH (ref 8–23)
CO2: 34 mmol/L — ABNORMAL HIGH (ref 22–32)
Calcium: 8.9 mg/dL (ref 8.9–10.3)
Chloride: 98 mmol/L (ref 98–111)
Creatinine, Ser: 1.02 mg/dL (ref 0.61–1.24)
GFR, Estimated: >60 mL/min (ref >60)
Glucose, Bld: 157 mg/dL — ABNORMAL HIGH (ref 70–99)
Potassium: 4 mmol/L (ref 3.5–5.1)
Sodium: 144 mmol/L (ref 135–145)

## 2020-12-19 LAB — GLUCOSE, CAPILLARY
Glucose-Capillary: 102 mg/dL — ABNORMAL HIGH (ref 70–99)
Glucose-Capillary: 130 mg/dL — ABNORMAL HIGH (ref 70–99)
Glucose-Capillary: 145 mg/dL — ABNORMAL HIGH (ref 70–99)
Glucose-Capillary: 154 mg/dL — ABNORMAL HIGH (ref 70–99)
Glucose-Capillary: 160 mg/dL — ABNORMAL HIGH (ref 70–99)
Glucose-Capillary: 166 mg/dL — ABNORMAL HIGH (ref 70–99)

## 2020-12-19 LAB — URIC ACID: Uric Acid, Serum: 6.4 mg/dL (ref 3.7–8.6)

## 2020-12-19 LAB — MAGNESIUM: Magnesium: 2.3 mg/dL (ref 1.7–2.4)

## 2020-12-19 MED ORDER — ENOXAPARIN SODIUM 60 MG/0.6ML ~~LOC~~ SOLN: 60.0000 mg | SUBCUTANEOUS | Status: DC

## 2020-12-19 MED ORDER — VITAL HIGH PROTEIN PO LIQD: 1000.0000 mL | ORAL | Status: DC

## 2020-12-19 MED ORDER — CHLORHEXIDINE GLUCONATE CLOTH 2 % EX PADS: 6.0000 | MEDICATED_PAD | Freq: Every day | CUTANEOUS | Status: DC

## 2020-12-19 MED ORDER — ALBUTEROL SULFATE (2.5 MG/3ML) 0.083% IN NEBU: 2.5000 mg | INHALATION_SOLUTION | RESPIRATORY_TRACT | 12 refills | Status: DC | PRN

## 2020-12-19 MED ORDER — PREDNISONE 20 MG PO TABS: 20.0000 mg | ORAL_TABLET | Freq: Every day | ORAL | Status: DC

## 2020-12-19 MED ORDER — ALLOPURINOL 100 MG PO TABS: 100.0000 mg | ORAL_TABLET | Freq: Every day | ORAL | Status: DC

## 2020-12-19 MED ORDER — FUROSEMIDE 10 MG/ML IJ SOLN: 40.0000 mg | Freq: Two times a day (BID) | INTRAMUSCULAR | 0 refills | Status: DC

## 2020-12-19 MED ORDER — CHLORHEXIDINE GLUCONATE 0.12% ORAL RINSE (MEDLINE KIT): 15.0000 mL | Freq: Two times a day (BID) | OROMUCOSAL | 0 refills | Status: DC

## 2020-12-19 MED ORDER — CLONIDINE HCL 0.1 MG PO TABS: 0.1000 mg | ORAL_TABLET | Freq: Four times a day (QID) | ORAL | Status: DC

## 2020-12-19 MED ORDER — SENNA 8.6 MG PO TABS: 1.0000 | ORAL_TABLET | Freq: Every day | ORAL | 0 refills | Status: DC | PRN

## 2020-12-19 MED ORDER — INSULIN ASPART 100 UNIT/ML ~~LOC~~ SOLN: 0.0000 [IU] | SUBCUTANEOUS | 11 refills | Status: DC

## 2020-12-19 MED ORDER — PANTOPRAZOLE SODIUM 40 MG PO PACK: 40.0000 mg | PACK | Freq: Every day | ORAL | Status: DC

## 2020-12-19 MED ORDER — ARFORMOTEROL TARTRATE 15 MCG/2ML IN NEBU: 15.0000 ug | INHALATION_SOLUTION | Freq: Two times a day (BID) | RESPIRATORY_TRACT | Status: DC

## 2020-12-19 MED ORDER — ACETAMINOPHEN 160 MG/5ML PO SOLN: 650.0000 mg | Freq: Four times a day (QID) | ORAL | 0 refills | Status: DC | PRN

## 2020-12-19 MED ORDER — HYDRALAZINE HCL 20 MG/ML IJ SOLN: 10.0000 mg | INTRAMUSCULAR | Status: DC | PRN

## 2020-12-19 MED ORDER — BUDESONIDE 0.5 MG/2ML IN SUSP: 0.5000 mg | Freq: Two times a day (BID) | RESPIRATORY_TRACT | 12 refills | Status: DC

## 2020-12-19 MED ORDER — FLUTICASONE PROPIONATE 50 MCG/ACT NA SUSP: 1.0000 | Freq: Every day | NASAL | 2 refills | Status: DC

## 2020-12-19 MED ORDER — DOCUSATE SODIUM 50 MG/5ML PO LIQD: 100.0000 mg | Freq: Two times a day (BID) | ORAL | 0 refills | Status: DC | PRN

## 2020-12-19 MED ORDER — REVEFENACIN 175 MCG/3ML IN SOLN: 175.0000 ug | Freq: Every day | RESPIRATORY_TRACT | Status: DC

## 2020-12-19 MED ORDER — PROSOURCE TF PO LIQD: 45.0000 mL | Freq: Every day | ORAL | Status: DC

## 2020-12-19 MED ORDER — POLYETHYLENE GLYCOL 3350 17 G PO PACK: 17.0000 g | PACK | Freq: Every day | ORAL | 0 refills | Status: DC | PRN

## 2020-12-19 MED ORDER — FREE WATER: 200.0000 mL | Freq: Three times a day (TID) | Status: DC

## 2020-12-19 MED ORDER — ONDANSETRON HCL 4 MG/2ML IJ SOLN: 4.0000 mg | Freq: Four times a day (QID) | INTRAMUSCULAR | 0 refills | Status: DC | PRN

## 2020-12-19 MED ORDER — ORAL CARE MOUTH RINSE: 15.0000 mL | Freq: Four times a day (QID) | OROMUCOSAL | 0 refills | Status: DC

## 2020-12-19 NOTE — Progress Notes (Signed)
Report called to [facility] and room needs to be zapped. Discharge instruction reviewed with wife and patient.

## 2020-12-19 NOTE — Discharge Summary (Addendum)
Physician Discharge Summary  Patient ID: HULON FERRON MRN: [id number] DOB/AGE: August 22, 1956 65 y.o.  Admit date: 12/08/2020 Discharge date: 12/19/2020    Discharge Diagnoses:  Acute on Chronic Hypoxic & Hypercapnic Respiratory Failure secondary to AECOPD  LLL Pneumonia  Failure to Wean from Mechanical Ventilation Acute Metabolic Encephalopathy secondary Hypoxia / Hypercapnia  Chronic Oxygen Dependence  Chronic Lymphocytic Leukemia  History of Tumor Lysis Syndrome GERD on Tube Feeding  HTN  Hypernatremia  Constipation Steroid Induced Hyperglycemia                                                                      DISCHARGE PLAN BY DIAGNOSIS      Acute on Chronic Hypoxic & Hypercapnic Respiratory Failure secondary to AECOPD  LLL Pneumonia  Failure to Wean from Mechanical Ventilation Chronic Oxygen Dependence - 4L baseline  Discharge Plan:  -continue slow vent weaning protocol  -ATC trials as tolerated  -trach care BID with inner cannula change and site cleaning  -clip trach sutures 1/30  -continue brovana, pulmicort & yupelri  -continue PT/OT efforts for mobility  -goal is to wean to ATC only -continue lasix for negative balance as renal function / BP permit -prednisone taper to off over one week  -continue fluticasone nasal spray  Acute Metabolic Encephalopathy secondary Hypoxia / Hypercapnia   Discharge Plan:  -Resolved, no acute follow up at this time   Chronic Lymphocytic Leukemia  History of Tumor Lysis Syndrome  Discharge Plan:  -follow up with Dr. Alen Blew / Heme-ONC as outpatient  -continue allopurinol   GERD on Tube Feeding   Discharge Plan:  -TF as tolerated -continue PPI  HTN   Discharge Plan:  -continue clonidine, lasix, lopressor   Hypernatremia   Discharge Plan:  -free water PT   Constipation  Discharge Plan:  -as needed colace, miralax   Steroid Induced Hyperglycemia  Discharge Plan:  -glucose control with SSI   -anticipate as steroids are reduced, glucose control will improve                     DISCHARGE SUMMARY    65 y/o male former smoker with severe COPD admitted to [facility] on 1/13 with reports of progressive dyspnea. While in the ER, he developed altered mental status and required intubation.  Found to have COPD exacerbation with acute on chronic hypoxic/hypercapnic respiratory failure. The patient was admitted to the ICU for further care. He was COVID & influenza negative on admit. He was pan cultured.  The patient was treated with IV rocephin + azithromycin in the setting of AECOPD with respiratory failure.  ECHO demonstrated a normal LVEF (60-65%). Pan cultures were negative. He failed an extubation trial on 1/16 and was re-intubated.  The patient ultimately underwent tracheostomy on 1/19 in the setting of difficulty weaning from mechanical ventilation. A #8 cuffed Shiley trah was placed.  The patient was weaned to intermittent ATC trials. He was treated for his AECOPD with brovana, pulmicort, yupelri and IV steroids.  Steroids were transitioned to PT prednisone.  He was supported while in ICU on TF for nutrition. BP control was achieved with lopressor and PRN hydralazine. CBG's controlled with SSI.  The patient was medically cleared for discharge 1/24 with plans as  above.              SIGNIFICANT DIAGNOSTIC STUDIES  Echo 1/13 >> EF 60 to 65%   MICRO DATA  1/13 Covid-19, Flu >> Negative 1/13 MRSA PCR >> Negative 1/13 Urine Culture >> negative   ANTIBIOTICS Ceftriaxone 1/12 >> 1/16 Azithromycin 1/12 >> 1/16  CONSULTS Oncology   TUBES / LINES ETT 1/13 >> 1/16 ETT 1/16 >> 1/19 Trach 1/19 >>   SIGNIFICANT EVENTS 1/13 admit 1/16 failed extubation trial 1/19 trach   Discharge Exam: General: ill appearing adult male sitting up in chair in NAD   HEENT: MM pink/moist, #8 trach midline c/d/i Neuro: pt awake/alert, watching TV, MAE CV: s1s2 RRR, no m/r/g PULM: non-labored on ATC 60%  fiO2, lungs clear but diminished bilaterally, #8 trach midline, sutures intact GI: soft, bsx4 active  Extremities: warm/dry, no edema  Skin: no rashes or lesions  Vitals:   12/19/20 0916 12/19/20 1000 12/19/20 1100 12/19/20 1133  BP: (!) 155/81 (!) 142/81 138/76   Pulse: (!) 104 95 78   Resp:  (!) 26 (!) 26   Temp: 98.9 F (37.2 C)   99.4 F (37.4 C)  TempSrc: Oral   Oral  SpO2: 94% 94% 93%   Weight:      Height:         Discharge Labs  BMET Recent Labs  Lab 12/12/20 1849 12/13/20 0705 12/14/20 0043 12/15/20 0406 12/16/20 0203 12/17/20 0200 12/18/20 0114 12/19/20 0127  NA 146* 146* 148* 150* 146* 142 143 144  K 5.7* 4.7 4.7 4.9 4.5 4.1 3.7 4.0  CL 104 105 106 108 106 100 99 98  CO2 32 31 33* _0 33* 34*  GLUCOSE 227* 181* 139* 143* 139* 143* 147* 157*  BUN 59* 57* 52* 44* 37* 45* 44* 39*  CREATININE 1.14 1.05 0.94 0.98 0.89 1.13 1.00 1.02  CALCIUM 8.5* 8.6* 9.0 8.9 8.8* 8.5* 8.8* 8.9  MG 3.0* 3.0* 2.8*  --   --  2.1 2.1 2.3  PHOS 4.2 3.3 3.7  --   --  3.8  --   --     CBC Recent Labs  Lab 12/17/20 1615 12/18/20 0114 12/19/20 0127  HGB 9.3* 9.2* 9.7*  HCT 32.7* 30.8* 34.2*  WBC 166.1* 129.3* 121.8*  PLT 283 264 273    Anti-Coagulation Recent Labs  Lab 12/14/20 0043  INR 1.1    Discharge Instructions    Call MD for:  difficulty breathing, headache or visual disturbances   Complete by: As directed    Call MD for:  extreme fatigue   Complete by: As directed    Call MD for:  hives   Complete by: As directed    Call MD for:  persistant dizziness or light-headedness   Complete by: As directed    Call MD for:  persistant nausea and vomiting   Complete by: As directed    Call MD for:  redness, tenderness, or signs of infection (pain, swelling, redness, odor or green/yellow discharge around incision site)   Complete by: As directed    Call MD for:  severe uncontrolled pain   Complete by: As directed    Call MD for:  temperature >100.4    Complete by: As directed    Discharge instructions   Complete by: As directed    1. Review medications carefully as they have changed / reflect acute care needs 2. Continue all support devices for transfer to [facility]  3. See discharge  summary for plan of care.   Discharge wound care:   Complete by: As directed    See discharge summary   Increase activity slowly   Complete by: As directed        Follow-up Information    Jani Gravel, MD Follow up.   Specialty: Internal Medicine Why: Call for appt post discharge from [facility] information: [place] [place] [address] [phone]        Wyatt Portela, MD. Schedule an appointment as soon as possible for a visit.   Specialty: Oncology Why: After discharge from [facility] information: [place] [address] [phone]                Allergies as of 12/19/2020      Reactions   Codeine Itching   "jittery"   Prozac [fluoxetine Hcl]    confusion      Medication List    STOP taking these medications   Cinnamon 500 MG capsule   hydrOXYzine 25 MG capsule Commonly known as: VISTARIL   ipratropium 0.02 % nebulizer solution Commonly known as: ATROVENT   metoprolol tartrate 25 MG tablet Commonly known as: LOPRESSOR   montelukast 10 MG tablet Commonly known as: SINGULAIR   nystatin 100000 UNIT/ML suspension Commonly known as: MYCOSTATIN   omeprazole 20 MG capsule Commonly known as: PRILOSEC   valsartan-hydrochlorothiazide 160-12.5 MG tablet Commonly known as: DIOVAN-HCT     TAKE these medications   acetaminophen 160 MG/5ML solution Commonly known as: TYLENOL Place 20.3 mLs (650 mg total) into feeding tube every 6 (six) hours as needed for headache.   albuterol (2.5 MG/3ML) 0.083% nebulizer solution Commonly known as: PROVENTIL Take 3 mLs (2.5 mg total) by nebulization every 4 (four) hours as needed for wheezing or shortness  of breath. What changed:   See the new instructions.  Another medication with the same name was removed. Continue taking this medication, and follow the directions you see here.   allopurinol 100 MG tablet Commonly known as: ZYLOPRIM Place 1 tablet (100 mg total) into feeding tube daily. Start taking on: December 20, 2020   arformoterol 15 MCG/2ML Nebu Commonly known as: BROVANA Take 2 mLs (15 mcg total) by nebulization 2 (two) times daily.   budesonide 0.5 MG/2ML nebulizer solution Commonly known as: PULMICORT Take 2 mLs (0.5 mg total) by nebulization 2 (two) times daily. What changed:   how much to take  how to take this  when to take this  additional instructions   chlorhexidine gluconate (MEDLINE KIT) 0.12 % solution Commonly known as: PERIDEX 15 mLs by Mouth Rinse route 2 (two) times daily.   Chlorhexidine Gluconate Cloth 2 % Pads Apply 6 each topically daily.   cloNIDine 0.1 MG tablet Commonly known as: CATAPRES Place 1 tablet (0.1 mg total) into feeding tube every 6 (six) hours. What changed:   medication strength  how much to take  how to take this  when to take this   docusate 50 MG/5ML liquid Commonly known as: COLACE Place 10 mLs (100 mg total) into feeding tube 2 (two) times daily as needed for mild constipation.   enoxaparin 60 MG/0.6ML injection Commonly known as: LOVENOX Inject 0.6 mLs (60 mg total) into the skin daily. Start taking on: December 20, 2020   feeding supplement (VITAL HIGH PROTEIN) Liqd liquid Place 1,000 mLs into feeding tube continuous.   feeding supplement (PROSource TF) liquid Place 45 mLs into feeding tube  daily. Start taking on: December 20, 2020   fluticasone 50 MCG/ACT nasal spray Commonly known as: FLONASE Place 1 spray into both nostrils daily. Start taking on: December 20, 2020 What changed: See the new instructions.   free water Soln Place 200 mLs into feeding tube every 8 (eight) hours.   furosemide 10  MG/ML injection Commonly known as: LASIX Inject 4 mLs (40 mg total) into the vein 2 (two) times daily.   hydrALAZINE 20 MG/ML injection Commonly known as: APRESOLINE Inject 0.5 mLs (10 mg total) into the vein every 4 (four) hours as needed (SBP 160 or DBP > 90).   insulin aspart 100 UNIT/ML injection Commonly known as: novoLOG Inject 0-20 Units into the skin every 4 (four) hours.   mouth rinse Liqd solution 15 mLs by Mouth Rinse route QID.   ondansetron 4 MG/2ML Soln injection Commonly known as: ZOFRAN Inject 2 mLs (4 mg total) into the vein every 6 (six) hours as needed for nausea or vomiting.   pantoprazole sodium 40 mg/20 mL Pack Commonly known as: PROTONIX Place 20 mLs (40 mg total) into feeding tube at bedtime.   polyethylene glycol 17 g packet Commonly known as: MIRALAX / GLYCOLAX Place 17 g into feeding tube daily as needed for moderate constipation.   predniSONE 20 MG tablet Commonly known as: DELTASONE Place 1 tablet (20 mg total) into feeding tube daily with breakfast. Start taking on: December 20, 2020   revefenacin 175 MCG/3ML nebulizer solution Commonly known as: YUPELRI Take 3 mLs (175 mcg total) by nebulization daily. Start taking on: December 20, 2020   senna 8.6 MG Tabs tablet Commonly known as: SENOKOT Place 1 tablet (8.6 mg total) into feeding tube daily as needed for moderate constipation (non responsive to miralax).   sodium chloride 0.65 % Soln nasal spray Commonly known as: OCEAN Place 2 sprays into both nostrils as needed for congestion.            Discharge Care Instructions  (From admission, onward)         Start     Ordered   12/19/20 0000  Discharge wound care:       Comments: See discharge summary   12/19/20 1338            Disposition: To [facility]  Discharged Condition: AQEEL NORGAARD has met maximum benefit of inpatient care and is medically stable and cleared for discharge.  Patient is  pending follow up as above.      Time spent on disposition:  Greater than 35 minutes     Signed: Noe Gens, MSN, NP-C, AGACNP-BC [facility] 12/19/2020, 1:42 PM   Please see [email or web address] for pager details.

## 2020-12-19 NOTE — Progress Notes (Signed)
Physical Therapy Treatment Patient Details Name: Isaac [name] MRN: [id number] DOB: [birth year] Today's Date: 12/19/2020    History of Present Illness 65 yo admitted 1/13 with AMS and hypoxia requiring intubation in ED. Failed extubation 1/16 with reintubation and trach placed 1/19. PMhx: COPD, anxiety, GERD, HLD, HTN, nephrolithiasis, CLL    PT Comments    Pt supine on arrival stating he is waiting on trach collar trial but RT stated he doesn't know what time that will occur. Pt agreeable to mobility on vent then possible trial in chair. Pt with HA, dizziness and some noted anxiety with transfer with SpO2 91-97% and pt given 3 O2 breaths during session along with calming, reassurance and encouragement. Pt moves well and BP elevated during session with RN aware. Pt able to transition to [facility] for BM and pivot to chair but couldn't tolerate further mobility or HEP and requires increased time and cueing for transfers. Will continue to follow.   HR 102-106 BP 194/89 sitting on BSC 160/95 with transition to recliner and legs up SpO2 91-97% on 40% CPAP   Follow Up Recommendations  SNF;Supervision/Assistance - 24 hour     Equipment Recommendations  Rolling walker with 5" wheels;3in1 (PT)    Recommendations for Other Services       Precautions / Restrictions Precautions Precautions: Fall;Other (comment) Precaution Comments: vent, trach    Mobility  Bed Mobility Overal bed mobility: Needs Assistance Bed Mobility: Supine to Sit     Supine to sit: Min assist;HOB elevated     General bed mobility comments: HOb 35 degrees with min HHA to elevate trunk ,cues to fully pivot to EOB  Transfers Overall transfer level: Needs assistance   Transfers: Sit to/from Stand Sit to Stand: Min assist Stand pivot transfers: Min assist       General transfer comment: cues for hand placement with use of RW to pivot from bed to [facility] to recliner  Ambulation/Gait             General Gait  Details: not yet ready   Stairs             Wheelchair Mobility    Modified Rankin (Stroke Patients Only)       Balance Overall balance assessment: Needs assistance Sitting-balance support: No upper extremity supported;Feet supported Sitting balance-Leahy Scale: Good Sitting balance - Comments: pt able to sit EOB and BSC without UE support   Standing balance support: Bilateral upper extremity supported Standing balance-Leahy Scale: Poor Standing balance comment: Reliant on UE support                            Cognition Arousal/Alertness: Awake/alert Behavior During Therapy: WFL for tasks assessed/performed Overall Cognitive Status: Within Functional Limits for tasks assessed                                        Exercises      General Comments        Pertinent Vitals/Pain Pain Score: 4  Pain Location: HA and dizziness with mobility with VSS Pain Intervention(s): Monitored during session;Repositioned    Home Living                      Prior Function            PT Goals (current goals can now be found  in the care plan section) Progress towards PT goals: Progressing toward goals    Frequency    Min 3X/week      PT Plan Current plan remains appropriate    Co-evaluation              AM-PAC PT "6 Clicks" Mobility   Outcome Measure  Help needed turning from your back to your side while in a flat bed without using bedrails?: A Little Help needed moving from lying on your back to sitting on the side of a flat bed without using bedrails?: A Little Help needed moving to and from a bed to a chair (including a wheelchair)?: A Little Help needed standing up from a chair using your arms (e.g., wheelchair or bedside chair)?: A Little Help needed to walk in hospital room?: A Lot Help needed climbing 3-5 steps with a railing? : Total 6 Click Score: 15    End of Session Equipment Utilized During Treatment: Gait  belt Activity Tolerance: Patient tolerated treatment well Patient left: in chair;with call bell/phone within reach;with chair alarm set Nurse Communication: Mobility status PT Visit Diagnosis: Other abnormalities of gait and mobility (R26.89);Difficulty in walking, not elsewhere classified (R26.2);Muscle weakness (generalized) (M62.81)     Time: 9563-8756 PT Time Calculation (min) (ACUTE ONLY): 49 min  Charges:  $Therapeutic Activity: 38-52 mins                     Danielle Lento P, PT Acute Rehabilitation Services Pager: [phone] Office: [phone]    Syre Knerr B Shivan Hodes 12/19/2020, 10:01 AM

## 2020-12-19 NOTE — Progress Notes (Signed)
NAME:  Isaac [name], MRN:  [id number], DOB:  08-Nov-1956, LOS: 61 ADMISSION DATE:  12/08/2020, CONSULTATION DATE:  12/19/20 REFERRING MD:  EDP, CHIEF COMPLAINT:  Shortness of breath   Brief History:  65 yo male former smoker with severe COPD brought to ER with progressive dyspnea.  Developed altered mental status and required intubation in ER.  Found to have COPD exacerbation with acute on chronic hypoxic/hypercapnic respiratory failure.  Past Medical History:  COPD, Chronic respiratory failure on 4 liters, Anxiety, GERD, HLD, HTN, Nephrolithiasis, Insomnia, CLL  Significant Hospital Events:  1/13 admit 1/16 failed extubation trial 1/19 trach  Consults:  Oncology  Procedures:  1/13 ETT >> 1/16 1/16 ETT >> 1/19 1/19 trach >>   Significant Diagnostic Tests:   Echo 1/13 >> EF 60 to 65%  Micro Data:  1/13 Covid-19, Flu >> Negative 1/13 MRSA PCR >> Negative 1/13 Urine Culture >> negative   Antimicrobials:  Ceftriaxone 1/12 >> 1/16 Azithromycin 1/12 >> 1/16  Interim History / Subjective:   Up in chair today. Trached. Remains on PS vent settings attempts to communicate   Objective   Blood pressure (!) 155/81, pulse (!) 104, temperature 98.1 F (36.7 C), temperature source Oral, resp. rate (!) 21, height 5\' 10"  (1.778 m), weight 119.1 kg, SpO2 94 %.    Vent Mode: CPAP;PSV FiO2 (%):  [40 %] 40 % Set Rate:  [20 bmp] 20 bmp Vt Set:  [600 mL] 600 mL PEEP:  [5 cmH20] 5 cmH20 Pressure Support:  [10 cmH20] 10 cmH20 Plateau Pressure:  [17 cmH20-21 cmH20] 18 cmH20   Intake/Output Summary (Last 24 hours) at 12/19/2020 8850 Last data filed at 12/19/2020 0700 Gross per 24 hour  Intake 541.11 ml  Output 4550 ml  Net -4008.89 ml   Filed Weights   12/17/20 0600 12/18/20 0500 12/19/20 0456  Weight: 121 kg 118.7 kg 119.1 kg    General - eldlery, obese, in chair  Eyes - pupils equal and reactive, tracking  ENT - trach site clean, cdi, no bleeding  Cardiac - RRR, s1 s2   Chest - BL vented breaths no wheeze  Abdomen - soft, nt nd, obese, Body mass index is 37.67 kg/m.  Extremities - trace BL LE edema  Skin - no rashes  Neuro - RASS 0, moves all 4   Resolved Hospital Problem list     Assessment & Plan:   Acute on chronic hypoxic/hypercapnic respiratory failure from COPD exacerbation. Failure to wean from ventilator s/p tracheostomy 1/19. LLL pneumonia, infiltrate on recent CXR is clearing  Plan:  Trach collar trial again today  Remains on intermitent vent support  Continue mobility and work with PT  Brovana, pulmicort and yupelri  I would like to wean to TCT alone with no vent needs Continue diuresis   Acute metabolic encephalopathy 2nd to hypoxia/hypercapnia. - resolved   Chronic lymphocytic leukemia. History of Tumor lysis syndrome. - followed by Dr. Alen Blew with hematology/oncology - continue allopurinol   Nutrition Feels he has reflux on current TF Plan - continue tube feeds   Positive fluid balance Plan Continue diuresis    Hx of HTN. - lopressor and catapress - holding ARB with ongoing diuriesis   Hypernatremia. - FW replacement with TF   Constipation. - mirlax prn   Steroid induced hyperglycemia. Plan CBGS + SSI  Goal 140-180  Anemia of chronic disease and critical illness. Plan: Conservative transfusion threshold hgb <7   Best practice (evaluated daily)  Diet: Tube Feeds  Pain/Anxiety/Delirium  protocol (if indicated): discontinued  VAP protocol (if indicated): HOB 30 degrees, suction prn DVT prophylaxis: Lovenox GI prophylaxis: Protonix Mobility: PT/OT Disposition: ICU Family Communication: Patient's wife updated at bedside 12/14/2020.  Goals of Care:  Last date of multidisciplinary goals of care discussion:1/13 Family and staff present:  Summary of discussion:  Follow up goals of care discussion due: 1/20 Code Status: Full code  Labs    CMP Latest Ref Rng & Units 12/19/2020 12/18/2020 12/17/2020   Glucose 70 - 99 mg/dL 157(H) 147(H) 143(H)  BUN 8 - 23 mg/dL 39(H) 44(H) 45(H)  Creatinine 0.61 - 1.24 mg/dL 1.02 1.00 1.13  Sodium 135 - 145 mmol/L 144 143 142  Potassium 3.5 - 5.1 mmol/L 4.0 3.7 4.1  Chloride 98 - 111 mmol/L 98 99 100  CO2 22 - 32 mmol/L 34(H) 33(H) 31  Calcium 8.9 - 10.3 mg/dL 8.9 8.8(L) 8.5(L)  Total Protein 6.5 - 8.1 g/dL - - 4.4(L)  Total Bilirubin 0.3 - 1.2 mg/dL - - 1.1  Alkaline Phos 38 - 126 U/L - - 52  AST 15 - 41 U/L - - 15  ALT 0 - 44 U/L - - 19    CBC Latest Ref Rng & Units 12/19/2020 12/18/2020 12/17/2020  WBC 4.0 - 10.5 K/uL 121.8(HH) 129.3(HH) 166.1(HH)  Hemoglobin 13.0 - 17.0 g/dL 9.7(L) 9.2(L) 9.3(L)  Hematocrit 39.0 - 52.0 % 34.2(L) 30.8(L) 32.7(L)  Platelets 150 - 400 K/uL 273 264 283    ABG    Component Value Date/Time   PHART 7.340 (L) 12/11/2020 2053   PCO2ART 68.3 ([place]) 12/11/2020 2053   PO2ART 80 (L) 12/11/2020 2053   HCO3 36.9 (H) 12/11/2020 2053   TCO2 39 (H) 12/11/2020 2053   O2SAT 94.0 12/11/2020 2053    CBG (last 3)  Recent Labs    12/18/20 2355 12/19/20 0401 12/19/20 0755  GLUCAP 145* 160* 154*    This patient is critically ill with multiple organ system failure; which, requires frequent high complexity decision making, assessment, support, evaluation, and titration of therapies. This was completed through the application of advanced monitoring technologies and extensive interpretation of multiple databases. During this encounter critical care time was devoted to patient care services described in this note for 32 minutes.  Garner Nash, DO [facility] 12/19/2020 9:53 AM

## 2020-12-20 ENCOUNTER — Encounter: Payer: Self-pay | Admitting: Internal Medicine

## 2020-12-20 DIAGNOSIS — J189 Pneumonia, unspecified organism: Secondary | ICD-10-CM | POA: Diagnosis not present

## 2020-12-20 DIAGNOSIS — J449 Chronic obstructive pulmonary disease, unspecified: Secondary | ICD-10-CM

## 2020-12-20 DIAGNOSIS — C9111 Chronic lymphocytic leukemia of B-cell type in remission: Secondary | ICD-10-CM

## 2020-12-20 DIAGNOSIS — J9621 Acute and chronic respiratory failure with hypoxia: Secondary | ICD-10-CM

## 2020-12-20 LAB — BLOOD GAS, ARTERIAL
Acid-Base Excess: 11.3 mmol/L — ABNORMAL HIGH (ref 0.0–2.0)
Bicarbonate: 35.8 mmol/L — ABNORMAL HIGH (ref 20.0–28.0)
FIO2: 40
O2 Saturation: 92.2 %
Patient temperature: 36.7
pCO2 arterial: 50.8 mmHg — ABNORMAL HIGH (ref 32.0–48.0)
pH, Arterial: 7.46 — ABNORMAL HIGH (ref 7.350–7.450)
pO2, Arterial: 62.6 mmHg — ABNORMAL LOW (ref 83.0–108.0)

## 2020-12-20 LAB — CBC WITH DIFFERENTIAL/PLATELET
Abs Immature Granulocytes: 1.04 10*3/uL — ABNORMAL HIGH (ref 0.00–0.07)
Basophils Absolute: 1.8 10*3/uL — ABNORMAL HIGH (ref 0.0–0.1)
Basophils Relative: 1 %
Eosinophils Absolute: 0 10*3/uL (ref 0.0–0.5)
Eosinophils Relative: 0 %
HCT: 37.9 % — ABNORMAL LOW (ref 39.0–52.0)
Hemoglobin: 10.5 g/dL — ABNORMAL LOW (ref 13.0–17.0)
Immature Granulocytes: 1 %
Lymphocytes Relative: 80 %
Lymphs Abs: 181.9 10*3/uL — ABNORMAL HIGH (ref 0.7–4.0)
MCH: 25.7 pg — ABNORMAL LOW (ref 26.0–34.0)
MCHC: 27.7 g/dL — ABNORMAL LOW (ref 30.0–36.0)
MCV: 92.9 fL (ref 80.0–100.0)
Monocytes Absolute: 29.3 10*3/uL — ABNORMAL HIGH (ref 0.1–1.0)
Monocytes Relative: 13 %
Neutro Abs: 11.6 10*3/uL — ABNORMAL HIGH (ref 1.7–7.7)
Neutrophils Relative %: 5 %
Platelets: 375 10*3/uL (ref 150–400)
RBC: 4.08 MIL/uL — ABNORMAL LOW (ref 4.22–5.81)
RDW: 13.9 % (ref 11.5–15.5)
WBC: 225.7 10*3/uL (ref 4.0–10.5)
nRBC: 0 % (ref 0.0–0.2)

## 2020-12-20 LAB — COMPREHENSIVE METABOLIC PANEL
ALT: 22 U/L (ref 0–44)
AST: 34 U/L (ref 15–41)
Albumin: 3.2 g/dL — ABNORMAL LOW (ref 3.5–5.0)
Alkaline Phosphatase: 84 U/L (ref 38–126)
Anion gap: 11 (ref 5–15)
BUN: 33 mg/dL — ABNORMAL HIGH (ref 8–23)
CO2: 33 mmol/L — ABNORMAL HIGH (ref 22–32)
Calcium: 9.1 mg/dL (ref 8.9–10.3)
Chloride: 95 mmol/L — ABNORMAL LOW (ref 98–111)
Creatinine, Ser: 0.99 mg/dL (ref 0.61–1.24)
GFR, Estimated: >60 mL/min (ref >60)
Glucose, Bld: 166 mg/dL — ABNORMAL HIGH (ref 70–99)
Potassium: 5.8 mmol/L — ABNORMAL HIGH (ref 3.5–5.1)
Sodium: 139 mmol/L (ref 135–145)
Total Bilirubin: 1.3 mg/dL — ABNORMAL HIGH (ref 0.3–1.2)
Total Protein: 6 g/dL — ABNORMAL LOW (ref 6.5–8.1)

## 2020-12-20 LAB — PHOSPHORUS: Phosphorus: 3.7 mg/dL (ref 2.5–4.6)

## 2020-12-20 LAB — PROTIME-INR
INR: 1 (ref 0.8–1.2)
Prothrombin Time: 13.2 seconds (ref 11.4–15.2)

## 2020-12-20 LAB — MAGNESIUM: Magnesium: 2.4 mg/dL (ref 1.7–2.4)

## 2020-12-20 LAB — CULTURE, RESPIRATORY W GRAM STAIN

## 2020-12-20 LAB — POTASSIUM
Potassium: 6.4 mmol/L (ref 3.5–5.1)
Potassium: 6.6 mmol/L (ref 3.5–5.1)

## 2020-12-20 LAB — APTT: aPTT: 26 seconds (ref 24–36)

## 2020-12-20 NOTE — Consult Note (Signed)
Pulmonary [place]  PULMONARY SERVICE  Date of Service: 12/20/2020  PULMONARY CRITICAL CARE CONSULT   Isaac [name]  YQI:[id number]  DOB: May 01, 1956   DOA: 12/19/2020  Referring Physician: Merton Border, MD  HPI: Isaac [name] is a 65 y.o. male seen for follow up of Acute on Chronic Respiratory Failure.  Patient transferred from the ICU basically medical problems including COPD presented to the hospital with COPD exacerbation.  Patient progressed into respiratory failure with increased PCO2 ended up intubated on the ventilator.  Other work-up included echocardiogram which showed an EF of 60 to 65%.  Patient work-up for cultures was unremarkable.  Patient was treated empirically with Rocephin and azithromycin for COPD exacerbation.  Also received aggressive pulmonary toileting.  Because of failure to wean patient subsequently ended up having to have a tracheostomy after failing an extubation trial.  Currently is on full support on assist control with an FiO2 of 40%  Review of Systems:  ROS performed and is unremarkable other than noted above.  Past Medical History:  Diagnosis Date  . Anxiety   . COPD (chronic obstructive pulmonary disease) ([place])   . GERD (gastroesophageal reflux disease)   . Hematuria   . Hyperlipidemia   . Hypertension   . Insomnia   . Nephrolithiasis   . Tobacco dependence     Past Surgical History:  Procedure Laterality Date  . LITHOTRIPSY      Social History:    reports that he quit smoking about 12 years ago. His smoking use included cigarettes. He has a 35.00 pack-year smoking history. He has never used smokeless tobacco. No history on file for alcohol use and drug use.  Family History: Non-Contributory to the present illness  Allergies  Allergen Reactions  . Codeine Itching    "jittery"  . Prozac [Fluoxetine Hcl]     confusion    Medications: Reviewed on Rounds  Physical Exam:  Vitals:  Temperature is 98.1 pulse 105 respiratory 27 blood pressure is 175/65 saturations 96%  Ventilator Settings on assist control FiO2 40% PEEP of 5 tidal volume 600  . General: Comfortable at this time . Eyes: Grossly normal lids, irises & conjunctiva . ENT: grossly tongue is normal . Neck: no obvious mass . Cardiovascular: S1-S2 normal no gallop or rub . Respiratory: Few scattered rhonchi are noted at this time . Abdomen: Soft and nontender . Skin: no rash seen on limited exam . Musculoskeletal: not rigid . Psychiatric:unable to assess . Neurologic: no seizure no involuntary movements         Labs on Admission:  Basic Metabolic Panel: Recent Labs  Lab 12/14/20 0043 12/15/20 0406 12/16/20 0203 12/17/20 0200 12/18/20 0114 12/19/20 0127 12/20/20 0446  NA 148*   < > 146* 142 143 144 139  K 4.7   < > 4.5 4.1 3.7 4.0 5.8*  CL 106   < > 106 100 99 98 95*  CO2 33*   < > 31 31 33* 34* 33*  GLUCOSE 139*   < > 139* 143* 147* 157* 166*  BUN 52*   < > 37* 45* 44* 39* 33*  CREATININE 0.94   < > 0.89 1.13 1.00 1.02 0.99  CALCIUM 9.0   < > 8.8* 8.5* 8.8* 8.9 9.1  MG 2.8*  --   --  2.1 2.1 2.3 2.4  PHOS 3.7  --   --  3.8  --   --  3.7   < > = values  in this interval not displayed.    Recent Labs  Lab 12/20/20 0545  PHART 7.460*  PCO2ART 50.8*  PO2ART 62.6*  HCO3 35.8*  O2SAT 92.2    Liver Function Tests: Recent Labs  Lab 12/17/20 0200 12/20/20 0446  AST 15 34  ALT 19 22  ALKPHOS 52 84  BILITOT 1.1 1.3*  PROT 4.4* 6.0*  ALBUMIN 2.6* 3.2*   No results for input(s): LIPASE, AMYLASE in the last 168 hours. No results for input(s): AMMONIA in the last 168 hours.  CBC: Recent Labs  Lab 12/15/20 0406 12/17/20 1615 12/18/20 0114 12/19/20 0127 12/20/20 0446  WBC 139.1* 166.1* 129.3* 121.8* 225.7*  NEUTROABS  --  36.5*  --   --  11.6*  HGB 9.8* 9.3* 9.2* 9.7* 10.5*  HCT 35.5* 32.7* 30.8* 34.2* 37.9*  MCV 94.9 92.9 91.1 91.9 92.9  PLT 243 283 264 273 375    Cardiac  Enzymes: No results for input(s): CKTOTAL, CKMB, CKMBINDEX, TROPONINI in the last 168 hours.  BNP (last 3 results) Recent Labs    12/08/20 0100  BNP 146.1*    ProBNP (last 3 results) No results for input(s): PROBNP in the last 8760 hours.   Radiological Exams on Admission: DG Chest 1 View  Result Date: 12/19/2020 CLINICAL DATA:  Evaluate tracheostomy and NG tubes after being moved. EXAM: PORTABLE ABDOMEN - 1 VIEW; CHEST  1 VIEW COMPARISON:  Chest 12/19/2020.  Abdomen 12/16/2020 FINDINGS: Tracheostomy tube remains in place without change in position. Enteric tube is present with tip in the right upper quadrant consistent with location in the distal stomach. Cardiac enlargement. Emphysematous changes in the lungs. Infiltrates suggested in the lung bases. Left basilar consolidation is mildly improved since previous study. Blunting of the left costophrenic angle suggesting effusion. No pneumothorax. Visualized bowel gas pattern is unremarkable. IMPRESSION: Enteric tube tip is in the right upper quadrant consistent with location in the distal stomach. Tracheostomy tube unchanged in position. Persistent basilar lung infiltrates with consolidation on the left and small left pleural effusion. Possible slight improvement since previous study. Electronically Signed   By: Lucienne Capers M.D.   On: 12/19/2020 22:12   DG Abd 1 View  Result Date: 12/16/2020 CLINICAL DATA:  Enteric tube placement EXAM: ABDOMEN - 1 VIEW COMPARISON:  December 08, 2020 abdominal radiograph; chest radiograph December 14, 2020 FINDINGS: Enteric tube tip is in the distal stomach. There is moderate stool in the colon. There is no bowel dilatation or air-fluid level to suggest bowel obstruction. No free air. There is a small left pleural effusion with ill-defined opacity in the left base. Subtle opacity noted right base, stable. IMPRESSION: Feeding tube tip in distal stomach. No bowel obstruction or free air. Small left pleural  effusion with suspected consolidation medial left base. Subtle airspace opacity right base, unchanged from recent chest radiograph. Electronically Signed   By: Lowella Grip III M.D.   On: 12/16/2020 11:40   DG CHEST PORT 1 VIEW  Result Date: 12/19/2020 CLINICAL DATA:  Respiratory failure EXAM: PORTABLE CHEST 1 VIEW COMPARISON:  12/18/2020 FINDINGS: Tracheostomy and nasoenteric feeding tube extending into the upper abdomen beyond the margin of the examination are again noted. Retrocardiac consolidation persists, slightly improved since prior examination. Scattered nodular infiltrates are again noted throughout the remainder of the lungs, grossly stable since prior examination. No pneumothorax or pleural effusion. Cardiac size within normal limits. IMPRESSION: Slight interval improvement in retrocardiac consolidation. Electronically Signed   By: Fidela Salisbury MD   On:  12/19/2020 05:24   DG CHEST PORT 1 VIEW  Result Date: 12/18/2020 CLINICAL DATA:  Pneumonia follow-up EXAM: PORTABLE CHEST 1 VIEW COMPARISON:  Yesterday FINDINGS: Tracheostomy and feeding tubes in unremarkable position. Dense retrocardiac opacity. More hazy density at the right base. Lung volumes are large. Chronic cardiomegaly. IMPRESSION: Stable hardware positioning and left more than right airspace disease. COPD. Electronically Signed   By: Monte Fantasia M.D.   On: 12/18/2020 06:43   DG CHEST PORT 1 VIEW  Result Date: 12/17/2020 CLINICAL DATA:  Respiratory failure EXAM: PORTABLE CHEST 1 VIEW COMPARISON:  12/14/2020 FINDINGS: Nasoenteric feeding tube is been placed with its tip extending into the upper abdomen beyond the margin of the examination. Tracheostomy is unchanged. The lungs appear stably hyperinflated in keeping with changes of underlying COPD. Progressive right basilar opacification and retrocardiac opacification may represent artifact related to overlying soft tissue on this semi upright, under penetrated examination.  Developing bibasilar infiltrate, however, is difficult to exclude. Cardiac size is within normal limits. Pulmonary vascularity is normal. IMPRESSION: Stable pulmonary hyperinflation suggesting changes of underlying COPD. Developing bibasilar pulmonary opacification likely artifactual and related to overlying soft tissue as opposed to developing bibasilar pulmonary infiltrate. Electronically Signed   By: Fidela Salisbury MD   On: 12/17/2020 05:40   DG Abd Portable 1V  Result Date: 12/19/2020 CLINICAL DATA:  Evaluate tracheostomy and NG tubes after being moved. EXAM: PORTABLE ABDOMEN - 1 VIEW; CHEST  1 VIEW COMPARISON:  Chest 12/19/2020.  Abdomen 12/16/2020 FINDINGS: Tracheostomy tube remains in place without change in position. Enteric tube is present with tip in the right upper quadrant consistent with location in the distal stomach. Cardiac enlargement. Emphysematous changes in the lungs. Infiltrates suggested in the lung bases. Left basilar consolidation is mildly improved since previous study. Blunting of the left costophrenic angle suggesting effusion. No pneumothorax. Visualized bowel gas pattern is unremarkable. IMPRESSION: Enteric tube tip is in the right upper quadrant consistent with location in the distal stomach. Tracheostomy tube unchanged in position. Persistent basilar lung infiltrates with consolidation on the left and small left pleural effusion. Possible slight improvement since previous study. Electronically Signed   By: Lucienne Capers M.D.   On: 12/19/2020 22:12    Assessment/Plan Active Problems:   Acute on chronic respiratory failure with hypoxia (HCC)   COPD, severe (HCC)   Left lower lobe pneumonia   Chronic lymphocytic leukemia in remission ([place])   1. Acute on chronic respiratory failure hypoxia patient right now is on full support on the ventilator has been on assist control with an FiO2 of 40% and a PEEP of 5 plan will be to continue to try to advance the weaning.  Patient  will have the mechanics checked today. 2. Severe COPD we will continue with medical management respiratory treatments we will continue with supportive care. 3. Left lower lobe pneumonia patient was treated with antibiotics in the form of azithromycin as well as Rocephin.  We will continue to follow up on x-rays results as noted above. 4. CLL supportive care we will follow up with the oncology afterwards  I have personally seen and evaluated the patient, evaluated laboratory and imaging results, formulated the assessment and plan and placed orders. The Patient requires high complexity decision making with multiple systems involvement.  Case was discussed on Rounds with the Respiratory Therapy Director and the Respiratory staff Time Spent 21minutes  Khara Renaud A Danish Ruffins, MD [facility] Pulmonary Critical Care Medicine Sleep Medicine

## 2020-12-21 LAB — POTASSIUM: Potassium: 3.6 mmol/L (ref 3.5–5.1)

## 2020-12-22 DIAGNOSIS — J189 Pneumonia, unspecified organism: Secondary | ICD-10-CM | POA: Diagnosis not present

## 2020-12-22 DIAGNOSIS — C9111 Chronic lymphocytic leukemia of B-cell type in remission: Secondary | ICD-10-CM | POA: Diagnosis not present

## 2020-12-22 DIAGNOSIS — J449 Chronic obstructive pulmonary disease, unspecified: Secondary | ICD-10-CM | POA: Diagnosis not present

## 2020-12-22 DIAGNOSIS — J9621 Acute and chronic respiratory failure with hypoxia: Secondary | ICD-10-CM | POA: Diagnosis not present

## 2020-12-22 LAB — BASIC METABOLIC PANEL
Anion gap: 11 (ref 5–15)
BUN: 31 mg/dL — ABNORMAL HIGH (ref 8–23)
CO2: 35 mmol/L — ABNORMAL HIGH (ref 22–32)
Calcium: 8.6 mg/dL — ABNORMAL LOW (ref 8.9–10.3)
Chloride: 95 mmol/L — ABNORMAL LOW (ref 98–111)
Creatinine, Ser: 1.04 mg/dL (ref 0.61–1.24)
GFR, Estimated: >60 mL/min (ref >60)
Glucose, Bld: 181 mg/dL — ABNORMAL HIGH (ref 70–99)
Potassium: 3.7 mmol/L (ref 3.5–5.1)
Sodium: 141 mmol/L (ref 135–145)

## 2020-12-22 LAB — CBC
HCT: 35.2 % — ABNORMAL LOW (ref 39.0–52.0)
Hemoglobin: 10.5 g/dL — ABNORMAL LOW (ref 13.0–17.0)
MCH: 27.2 pg (ref 26.0–34.0)
MCHC: 29.8 g/dL — ABNORMAL LOW (ref 30.0–36.0)
MCV: 91.2 fL (ref 80.0–100.0)
Platelets: 339 10*3/uL (ref 150–400)
RBC: 3.86 MIL/uL — ABNORMAL LOW (ref 4.22–5.81)
RDW: 14.1 % (ref 11.5–15.5)
WBC: 183.5 10*3/uL (ref 4.0–10.5)
nRBC: 0 % (ref 0.0–0.2)

## 2020-12-22 NOTE — Progress Notes (Signed)
These results have been called to [facility]. They will contact the pharmacy at [facility] and let them know about the results of the culture and the sensitivity results so that they can treat appropriately if they feel this is not a contaminant.

## 2020-12-22 NOTE — Progress Notes (Signed)
Pulmonary Critical Care Medicine [place]   PULMONARY CRITICAL CARE SERVICE  PROGRESS NOTE  Date of Service: 12/22/2020  Isaac [name]  AYT:[id number]  DOB: 1956/07/09   DOA: 12/19/2020  Referring Physician: Merton Border, MD  HPI: Isaac [name] is a 65 y.o. male seen for follow up of Acute on Chronic Respiratory Failure.  Patient was on the wean pressure support this morning for about 12-hour goal  Medications: Reviewed on Rounds  Physical Exam:  Vitals: Temperature is 97.7 pulse 101 respiratory 23 blood pressure is 169/87 saturations 97%  Ventilator Settings on pressure support FiO2 35% pressure 12/5  . General: Comfortable at this time . Eyes: Grossly normal lids, irises & conjunctiva . ENT: grossly tongue is normal . Neck: no obvious mass . Cardiovascular: S1 S2 normal no gallop . Respiratory: Scattered rhonchi expansion is equal . Abdomen: soft . Skin: no rash seen on limited exam . Musculoskeletal: not rigid . Psychiatric:unable to assess . Neurologic: no seizure no involuntary movements         Lab Data:   Basic Metabolic Panel: Recent Labs  Lab 12/17/20 0200 12/18/20 0114 12/19/20 0127 12/20/20 0446 12/20/20 1525 12/20/20 2219 12/21/20 0736 12/22/20 0421  NA 142 143 144 139  --   --   --  141  K 4.1 3.7 4.0 5.8* 6.4* 6.6* 3.6 3.7  CL 100 99 98 95*  --   --   --  95*  CO2 31 33* 34* 33*  --   --   --  35*  GLUCOSE 143* 147* 157* 166*  --   --   --  181*  BUN 45* 44* 39* 33*  --   --   --  31*  CREATININE 1.13 1.00 1.02 0.99  --   --   --  1.04  CALCIUM 8.5* 8.8* 8.9 9.1  --   --   --  8.6*  MG 2.1 2.1 2.3 2.4  --   --   --   --   PHOS 3.8  --   --  3.7  --   --   --   --     ABG: Recent Labs  Lab 12/20/20 0545  PHART 7.460*  PCO2ART 50.8*  PO2ART 62.6*  HCO3 35.8*  O2SAT 92.2    Liver Function Tests: Recent Labs  Lab 12/17/20 0200 12/20/20 0446  AST 15 34  ALT 19 22  ALKPHOS 52 84  BILITOT 1.1 1.3*  PROT  4.4* 6.0*  ALBUMIN 2.6* 3.2*   No results for input(s): LIPASE, AMYLASE in the last 168 hours. No results for input(s): AMMONIA in the last 168 hours.  CBC: Recent Labs  Lab 12/17/20 1615 12/18/20 0114 12/19/20 0127 12/20/20 0446 12/22/20 0421  WBC 166.1* 129.3* 121.8* 225.7* 183.5*  NEUTROABS 36.5*  --   --  11.6*  --   HGB 9.3* 9.2* 9.7* 10.5* 10.5*  HCT 32.7* 30.8* 34.2* 37.9* 35.2*  MCV 92.9 91.1 91.9 92.9 91.2  PLT 283 264 273 375 339    Cardiac Enzymes: No results for input(s): CKTOTAL, CKMB, CKMBINDEX, TROPONINI in the last 168 hours.  BNP (last 3 results) Recent Labs    12/08/20 0100  BNP 146.1*    ProBNP (last 3 results) No results for input(s): PROBNP in the last 8760 hours.  Radiological Exams: No results found.  Assessment/Plan Active Problems:   Acute on chronic respiratory failure with hypoxia (HCC)   COPD, severe (HCC)   Left  lower lobe pneumonia   Chronic lymphocytic leukemia in remission ([place])   1. Acute on chronic respiratory failure hypoxia we will continue with pressure support weaning as ordered for 12-hour goal 2. Severe COPD at baseline 3. Left lower lobe pneumonia treated 4. CLL continue with supportive care prognosis guarded   I have personally seen and evaluated the patient, evaluated laboratory and imaging results, formulated the assessment and plan and placed orders. The Patient requires high complexity decision making with multiple systems involvement.  Rounds were done with the Respiratory Therapy Director and Staff therapists and discussed with nursing staff also.  Allyne Gee, MD [facility] Pulmonary Critical Care Medicine Sleep Medicine

## 2020-12-23 DIAGNOSIS — J449 Chronic obstructive pulmonary disease, unspecified: Secondary | ICD-10-CM | POA: Diagnosis not present

## 2020-12-23 DIAGNOSIS — E46 Unspecified protein-calorie malnutrition: Secondary | ICD-10-CM | POA: Diagnosis not present

## 2020-12-23 DIAGNOSIS — I10 Essential (primary) hypertension: Secondary | ICD-10-CM | POA: Diagnosis not present

## 2020-12-23 DIAGNOSIS — J9621 Acute and chronic respiratory failure with hypoxia: Secondary | ICD-10-CM | POA: Diagnosis not present

## 2020-12-23 DIAGNOSIS — J9690 Respiratory failure, unspecified, unspecified whether with hypoxia or hypercapnia: Secondary | ICD-10-CM | POA: Diagnosis not present

## 2020-12-23 DIAGNOSIS — L89159 Pressure ulcer of sacral region, unspecified stage: Secondary | ICD-10-CM | POA: Diagnosis not present

## 2020-12-23 DIAGNOSIS — J189 Pneumonia, unspecified organism: Secondary | ICD-10-CM | POA: Diagnosis not present

## 2020-12-23 DIAGNOSIS — C9111 Chronic lymphocytic leukemia of B-cell type in remission: Secondary | ICD-10-CM | POA: Diagnosis not present

## 2020-12-23 NOTE — Progress Notes (Signed)
Pulmonary Critical Care Medicine [place]   PULMONARY CRITICAL CARE SERVICE  PROGRESS NOTE  Date of Service: 12/23/2020  Isaac [name]  [id number]  DOB: [birth year]   DOA: 12/19/2020  Referring Physician: Merton Border, MD  HPI: Isaac [name] is a 65 y.o. male seen for follow up of Acute on Chronic Respiratory Failure.  Patient currently is on pressure support has been on 35% FiO2 pressure support of 12/5 goal is to go up to 24-hour  Medications: Reviewed on Rounds  Physical Exam:  Vitals: Temperature is 97.9 pulse 81 respiratory 20 blood pressure is 110/61 saturations 98%  Ventilator Settings pressure support FiO2 35% pressure 12/5  . General: Comfortable at this time . Eyes: Grossly normal lids, irises & conjunctiva . ENT: grossly tongue is normal . Neck: no obvious mass . Cardiovascular: S1 S2 normal no gallop . Respiratory: Scattered rhonchi expansion is equal . Abdomen: soft . Skin: no rash seen on limited exam . Musculoskeletal: not rigid . Psychiatric:unable to assess . Neurologic: no seizure no involuntary movements         Lab Data:   Basic Metabolic Panel: Recent Labs  Lab 12/17/20 0200 12/18/20 0114 12/19/20 0127 12/20/20 0446 12/20/20 1525 12/20/20 2219 12/21/20 0736 12/22/20 0421  NA 142 143 144 139  --   --   --  141  K 4.1 3.7 4.0 5.8* 6.4* 6.6* 3.6 3.7  CL 100 99 98 95*  --   --   --  95*  CO2 31 33* 34* 33*  --   --   --  35*  GLUCOSE 143* 147* 157* 166*  --   --   --  181*  BUN 45* 44* 39* 33*  --   --   --  31*  CREATININE 1.13 1.00 1.02 0.99  --   --   --  1.04  CALCIUM 8.5* 8.8* 8.9 9.1  --   --   --  8.6*  MG 2.1 2.1 2.3 2.4  --   --   --   --   PHOS 3.8  --   --  3.7  --   --   --   --     ABG: Recent Labs  Lab 12/20/20 0545  PHART 7.460*  PCO2ART 50.8*  PO2ART 62.6*  HCO3 35.8*  O2SAT 92.2    Liver Function Tests: Recent Labs  Lab 12/17/20 0200 12/20/20 0446  AST 15 34  ALT 19 22   ALKPHOS 52 84  BILITOT 1.1 1.3*  PROT 4.4* 6.0*  ALBUMIN 2.6* 3.2*   No results for input(s): LIPASE, AMYLASE in the last 168 hours. No results for input(s): AMMONIA in the last 168 hours.  CBC: Recent Labs  Lab 12/17/20 1615 12/18/20 0114 12/19/20 0127 12/20/20 0446 12/22/20 0421  WBC 166.1* 129.3* 121.8* 225.7* 183.5*  NEUTROABS 36.5*  --   --  11.6*  --   HGB 9.3* 9.2* 9.7* 10.5* 10.5*  HCT 32.7* 30.8* 34.2* 37.9* 35.2*  MCV 92.9 91.1 91.9 92.9 91.2  PLT 283 264 273 375 339    Cardiac Enzymes: No results for input(s): CKTOTAL, CKMB, CKMBINDEX, TROPONINI in the last 168 hours.  BNP (last 3 results) Recent Labs    12/08/20 0100  BNP 146.1*    ProBNP (last 3 results) No results for input(s): PROBNP in the last 8760 hours.  Radiological Exams: No results found.  Assessment/Plan Active Problems:   Acute on chronic respiratory failure with hypoxia (HCC)  COPD, severe ([place])   Left lower lobe pneumonia   Chronic lymphocytic leukemia in remission ([place])   1. Acute on chronic respiratory failure hypoxia we will continue with pressure support titrate oxygen continue pulmonary toilet. 2. Severe COPD no change we will continue to follow 3. Left lower lobe pneumonia treated 4. CLL no change we will continue with follow-up after discharge   I have personally seen and evaluated the patient, evaluated laboratory and imaging results, formulated the assessment and plan and placed orders. The Patient requires high complexity decision making with multiple systems involvement.  Rounds were done with the Respiratory Therapy Director and Staff therapists and discussed with nursing staff also.  Allyne Gee, MD [facility] Pulmonary Critical Care Medicine Sleep Medicine

## 2020-12-24 DIAGNOSIS — J449 Chronic obstructive pulmonary disease, unspecified: Secondary | ICD-10-CM | POA: Diagnosis not present

## 2020-12-24 DIAGNOSIS — L89159 Pressure ulcer of sacral region, unspecified stage: Secondary | ICD-10-CM | POA: Diagnosis not present

## 2020-12-24 DIAGNOSIS — J189 Pneumonia, unspecified organism: Secondary | ICD-10-CM | POA: Diagnosis not present

## 2020-12-24 DIAGNOSIS — I10 Essential (primary) hypertension: Secondary | ICD-10-CM | POA: Diagnosis not present

## 2020-12-24 DIAGNOSIS — J9690 Respiratory failure, unspecified, unspecified whether with hypoxia or hypercapnia: Secondary | ICD-10-CM | POA: Diagnosis not present

## 2020-12-24 DIAGNOSIS — J9621 Acute and chronic respiratory failure with hypoxia: Secondary | ICD-10-CM | POA: Diagnosis not present

## 2020-12-24 DIAGNOSIS — C9111 Chronic lymphocytic leukemia of B-cell type in remission: Secondary | ICD-10-CM | POA: Diagnosis not present

## 2020-12-24 DIAGNOSIS — E46 Unspecified protein-calorie malnutrition: Secondary | ICD-10-CM | POA: Diagnosis not present

## 2020-12-24 LAB — BASIC METABOLIC PANEL
Anion gap: 12 (ref 5–15)
BUN: 32 mg/dL — ABNORMAL HIGH (ref 8–23)
CO2: 33 mmol/L — ABNORMAL HIGH (ref 22–32)
Calcium: 9 mg/dL (ref 8.9–10.3)
Chloride: 94 mmol/L — ABNORMAL LOW (ref 98–111)
Creatinine, Ser: 1.08 mg/dL (ref 0.61–1.24)
GFR, Estimated: >60 mL/min (ref >60)
Glucose, Bld: 129 mg/dL — ABNORMAL HIGH (ref 70–99)
Potassium: 3.4 mmol/L — ABNORMAL LOW (ref 3.5–5.1)
Sodium: 139 mmol/L (ref 135–145)

## 2020-12-24 LAB — CBC
HCT: 38.3 % — ABNORMAL LOW (ref 39.0–52.0)
Hemoglobin: 10.9 g/dL — ABNORMAL LOW (ref 13.0–17.0)
MCH: 26 pg (ref 26.0–34.0)
MCHC: 28.5 g/dL — ABNORMAL LOW (ref 30.0–36.0)
MCV: 91.2 fL (ref 80.0–100.0)
Platelets: 359 10*3/uL (ref 150–400)
RBC: 4.2 MIL/uL — ABNORMAL LOW (ref 4.22–5.81)
RDW: 13.7 % (ref 11.5–15.5)
WBC: 197.8 10*3/uL (ref 4.0–10.5)
nRBC: 0 % (ref 0.0–0.2)

## 2020-12-24 LAB — POTASSIUM
Potassium: 6.9 mmol/L (ref 3.5–5.1)
Potassium: 7 mmol/L (ref 3.5–5.1)

## 2020-12-24 NOTE — Progress Notes (Signed)
Pulmonary Critical Care Medicine [place]   PULMONARY CRITICAL CARE SERVICE  PROGRESS NOTE  Date of Service: 12/24/2020  BERND CROM  KKX:[id number]  DOB: [birth year]   DOA: 12/19/2020  Referring Physician: Merton Border, MD  HPI: GADGE HERMIZ is a 65 y.o. male seen for follow up of Acute on Chronic Respiratory Failure.  Patient currently is on T collar has been on 35% FiO2 the goal is for 4 hours  Medications: Reviewed on Rounds  Physical Exam:  Vitals: Temperature 97.4 pulse 78 respiratory rate 20 blood pressure is 138/77 saturations 92%  Ventilator Settings on T collar with an FiO2 of 35%  . General: Comfortable at this time . Eyes: Grossly normal lids, irises & conjunctiva . ENT: grossly tongue is normal . Neck: no obvious mass . Cardiovascular: S1 S2 normal no gallop . Respiratory: No rhonchi very coarse breath . Abdomen: soft . Skin: no rash seen on limited exam . Musculoskeletal: not rigid . Psychiatric:unable to assess . Neurologic: no seizure no involuntary movements         Lab Data:   Basic Metabolic Panel: Recent Labs  Lab 12/18/20 0114 12/19/20 0127 12/20/20 0446 12/20/20 1525 12/20/20 2219 12/21/20 0736 12/22/20 0421 12/24/20 0558  NA 143 144 139  --   --   --  141 139  K 3.7 4.0 5.8* 6.4* 6.6* 3.6 3.7 3.4*  CL 99 98 95*  --   --   --  95* 94*  CO2 33* 34* 33*  --   --   --  35* 33*  GLUCOSE 147* 157* 166*  --   --   --  181* 129*  BUN 44* 39* 33*  --   --   --  31* 32*  CREATININE 1.00 1.02 0.99  --   --   --  1.04 1.08  CALCIUM 8.8* 8.9 9.1  --   --   --  8.6* 9.0  MG 2.1 2.3 2.4  --   --   --   --   --   PHOS  --   --  3.7  --   --   --   --   --     ABG: Recent Labs  Lab 12/20/20 0545  PHART 7.460*  PCO2ART 50.8*  PO2ART 62.6*  HCO3 35.8*  O2SAT 92.2    Liver Function Tests: Recent Labs  Lab 12/20/20 0446  AST 34  ALT 22  ALKPHOS 84  BILITOT 1.3*  PROT 6.0*  ALBUMIN 3.2*   No results for  input(s): LIPASE, AMYLASE in the last 168 hours. No results for input(s): AMMONIA in the last 168 hours.  CBC: Recent Labs  Lab 12/17/20 1615 12/18/20 0114 12/19/20 0127 12/20/20 0446 12/22/20 0421 12/24/20 0558  WBC 166.1* 129.3* 121.8* 225.7* 183.5* 197.8*  NEUTROABS 36.5*  --   --  11.6*  --   --   HGB 9.3* 9.2* 9.7* 10.5* 10.5* 10.9*  HCT 32.7* 30.8* 34.2* 37.9* 35.2* 38.3*  MCV 92.9 91.1 91.9 92.9 91.2 91.2  PLT 283 264 273 375 339 359    Cardiac Enzymes: No results for input(s): CKTOTAL, CKMB, CKMBINDEX, TROPONINI in the last 168 hours.  BNP (last 3 results) Recent Labs    12/08/20 0100  BNP 146.1*    ProBNP (last 3 results) No results for input(s): PROBNP in the last 8760 hours.  Radiological Exams: No results found.  Assessment/Plan Active Problems:   Acute on chronic respiratory failure  with hypoxia (HCC)   COPD, severe ([place])   Left lower lobe pneumonia   Chronic lymphocytic leukemia in remission ([place])   1. Acute on chronic respiratory failure hypoxia we will continue with T collar goal of 4 hours 2. Severe COPD patient is at baseline 3. Left lower lobe pneumonia no change 4. Chronic lymphocytic leukemia supportive care lymphocytic   I have personally seen and evaluated the patient, evaluated laboratory and imaging results, formulated the assessment and plan and placed orders. The Patient requires high complexity decision making with multiple systems involvement.  Rounds were done with the Respiratory Therapy Director and Staff therapists and discussed with nursing staff also.  Allyne Gee, MD [facility] Pulmonary Critical Care Medicine Sleep Medicine

## 2020-12-25 DIAGNOSIS — C9111 Chronic lymphocytic leukemia of B-cell type in remission: Secondary | ICD-10-CM | POA: Diagnosis not present

## 2020-12-25 DIAGNOSIS — J189 Pneumonia, unspecified organism: Secondary | ICD-10-CM | POA: Diagnosis not present

## 2020-12-25 DIAGNOSIS — I10 Essential (primary) hypertension: Secondary | ICD-10-CM | POA: Diagnosis not present

## 2020-12-25 DIAGNOSIS — L89159 Pressure ulcer of sacral region, unspecified stage: Secondary | ICD-10-CM | POA: Diagnosis not present

## 2020-12-25 DIAGNOSIS — E46 Unspecified protein-calorie malnutrition: Secondary | ICD-10-CM | POA: Diagnosis not present

## 2020-12-25 DIAGNOSIS — J9621 Acute and chronic respiratory failure with hypoxia: Secondary | ICD-10-CM | POA: Diagnosis not present

## 2020-12-25 DIAGNOSIS — J9690 Respiratory failure, unspecified, unspecified whether with hypoxia or hypercapnia: Secondary | ICD-10-CM | POA: Diagnosis not present

## 2020-12-25 DIAGNOSIS — J449 Chronic obstructive pulmonary disease, unspecified: Secondary | ICD-10-CM | POA: Diagnosis not present

## 2020-12-25 LAB — POTASSIUM: Potassium: 4.1 mmol/L (ref 3.5–5.1)

## 2020-12-25 NOTE — Progress Notes (Signed)
Pulmonary Critical Care Medicine [place]   PULMONARY CRITICAL CARE SERVICE  PROGRESS NOTE  Date of Service: 12/25/2020  Isaac [name]  [id number]:[id number]  DOB: [birth year]   DOA: 12/19/2020  Referring Physician: Merton Border, MD  HPI: Isaac [name] is a 65 y.o. male seen for follow up of Acute on Chronic Respiratory Failure.  Patient currently is on T collar has been on 35% FiO2 secretions are moderate  Medications: Reviewed on Rounds  Physical Exam:  Vitals: Temperature is 96.2 pulse 86 respiratory rate 20 blood pressure is 127/72 saturations 96%  Ventilator Settings off the ventilator on T collar FiO2 35%  . General: Comfortable at this time . Eyes: Grossly normal lids, irises & conjunctiva . ENT: grossly tongue is normal . Neck: no obvious mass . Cardiovascular: S1 S2 normal no gallop . Respiratory: Scattered rhonchi coarse breath sound . Abdomen: soft . Skin: no rash seen on limited exam . Musculoskeletal: not rigid . Psychiatric:unable to assess . Neurologic: no seizure no involuntary movements         Lab Data:   Basic Metabolic Panel: Recent Labs  Lab 12/19/20 0127 12/20/20 0446 12/20/20 1525 12/22/20 0421 12/24/20 0558 12/24/20 1312 12/24/20 1537 12/25/20 0430  NA 144 139  --  141 139  --   --   --   K 4.0 5.8*   < > 3.7 3.4* 6.9* 7.0* 4.1  CL 98 95*  --  95* 94*  --   --   --   CO2 34* 33*  --  35* 33*  --   --   --   GLUCOSE 157* 166*  --  181* 129*  --   --   --   BUN 39* 33*  --  31* 32*  --   --   --   CREATININE 1.02 0.99  --  1.04 1.08  --   --   --   CALCIUM 8.9 9.1  --  8.6* 9.0  --   --   --   MG 2.3 2.4  --   --   --   --   --   --   PHOS  --  3.7  --   --   --   --   --   --    < > = values in this interval not displayed.    ABG: Recent Labs  Lab 12/20/20 0545  PHART 7.460*  PCO2ART 50.8*  PO2ART 62.6*  HCO3 35.8*  O2SAT 92.2    Liver Function Tests: Recent Labs  Lab 12/20/20 0446  AST 34  ALT 22   ALKPHOS 84  BILITOT 1.3*  PROT 6.0*  ALBUMIN 3.2*   No results for input(s): LIPASE, AMYLASE in the last 168 hours. No results for input(s): AMMONIA in the last 168 hours.  CBC: Recent Labs  Lab 12/19/20 0127 12/20/20 0446 12/22/20 0421 12/24/20 0558  WBC 121.8* 225.7* 183.5* 197.8*  NEUTROABS  --  11.6*  --   --   HGB 9.7* 10.5* 10.5* 10.9*  HCT 34.2* 37.9* 35.2* 38.3*  MCV 91.9 92.9 91.2 91.2  PLT 273 375 339 359    Cardiac Enzymes: No results for input(s): CKTOTAL, CKMB, CKMBINDEX, TROPONINI in the last 168 hours.  BNP (last 3 results) Recent Labs    12/08/20 0100  BNP 146.1*    ProBNP (last 3 results) No results for input(s): PROBNP in the last 8760 hours.  Radiological Exams: No results found.  Assessment/Plan Active Problems:   Acute on chronic respiratory failure with hypoxia (HCC)   COPD, severe (HCC)   Left lower lobe pneumonia   Chronic lymphocytic leukemia in remission ([place])   1. Acute on chronic respiratory failure hypoxia we will continue with T-piece titrate oxygen as tolerated continue pulmonary toilet. 2. Severe COPD patient is at baseline 3. Left lower lobe pneumonia no change we will continue to follow 4. Chronic lymphocytic leukemia and supportive care   I have personally seen and evaluated the patient, evaluated laboratory and imaging results, formulated the assessment and plan and placed orders. The Patient requires high complexity decision making with multiple systems involvement.  Rounds were done with the Respiratory Therapy Director and Staff therapists and discussed with nursing staff also.  Allyne Gee, MD [facility] Pulmonary Critical Care Medicine Sleep Medicine

## 2020-12-26 DIAGNOSIS — J9621 Acute and chronic respiratory failure with hypoxia: Secondary | ICD-10-CM | POA: Diagnosis not present

## 2020-12-26 DIAGNOSIS — J189 Pneumonia, unspecified organism: Secondary | ICD-10-CM | POA: Diagnosis not present

## 2020-12-26 DIAGNOSIS — J449 Chronic obstructive pulmonary disease, unspecified: Secondary | ICD-10-CM | POA: Diagnosis not present

## 2020-12-26 DIAGNOSIS — I10 Essential (primary) hypertension: Secondary | ICD-10-CM | POA: Diagnosis not present

## 2020-12-26 DIAGNOSIS — C9111 Chronic lymphocytic leukemia of B-cell type in remission: Secondary | ICD-10-CM | POA: Diagnosis not present

## 2020-12-26 DIAGNOSIS — J9690 Respiratory failure, unspecified, unspecified whether with hypoxia or hypercapnia: Secondary | ICD-10-CM | POA: Diagnosis not present

## 2020-12-26 DIAGNOSIS — L89159 Pressure ulcer of sacral region, unspecified stage: Secondary | ICD-10-CM | POA: Diagnosis not present

## 2020-12-26 DIAGNOSIS — E46 Unspecified protein-calorie malnutrition: Secondary | ICD-10-CM | POA: Diagnosis not present

## 2020-12-26 NOTE — Progress Notes (Signed)
Pulmonary Critical Care Medicine [place]   PULMONARY CRITICAL CARE SERVICE  PROGRESS NOTE  Date of Service: 12/26/2020  Isaac [name]  [id number]  DOB: July 13, 1956   DOA: 12/19/2020  Referring Physician: Merton Border, MD  HPI: Isaac [name] is a 65 y.o. male seen for follow up of Acute on Chronic Respiratory Failure.  Patient currently is on T collar has been on 35% FiO2 the goal today is for 12 hours  Medications: Reviewed on Rounds  Physical Exam:  Vitals: Temperature is 99 pulse 72 respiratory rate 23 blood pressure is 117/71 saturations 96%  Ventilator Settings on T collar FiO2 of 35%  . General: Comfortable at this time . Eyes: Grossly normal lids, irises & conjunctiva . ENT: grossly tongue is normal . Neck: no obvious mass . Cardiovascular: S1 S2 normal no gallop . Respiratory: No rhonchi coarse breath sound . Abdomen: soft . Skin: no rash seen on limited exam . Musculoskeletal: not rigid . Psychiatric:unable to assess . Neurologic: no seizure no involuntary movements         Lab Data:   Basic Metabolic Panel: Recent Labs  Lab 12/20/20 0446 12/20/20 1525 12/22/20 0421 12/24/20 0558 12/24/20 1312 12/24/20 1537 12/25/20 0430  NA 139  --  141 139  --   --   --   K 5.8*   < > 3.7 3.4* 6.9* 7.0* 4.1  CL 95*  --  95* 94*  --   --   --   CO2 33*  --  35* 33*  --   --   --   GLUCOSE 166*  --  181* 129*  --   --   --   BUN 33*  --  31* 32*  --   --   --   CREATININE 0.99  --  1.04 1.08  --   --   --   CALCIUM 9.1  --  8.6* 9.0  --   --   --   MG 2.4  --   --   --   --   --   --   PHOS 3.7  --   --   --   --   --   --    < > = values in this interval not displayed.    ABG: Recent Labs  Lab 12/20/20 0545  PHART 7.460*  PCO2ART 50.8*  PO2ART 62.6*  HCO3 35.8*  O2SAT 92.2    Liver Function Tests: Recent Labs  Lab 12/20/20 0446  AST 34  ALT 22  ALKPHOS 84  BILITOT 1.3*  PROT 6.0*  ALBUMIN 3.2*   No results for  input(s): LIPASE, AMYLASE in the last 168 hours. No results for input(s): AMMONIA in the last 168 hours.  CBC: Recent Labs  Lab 12/20/20 0446 12/22/20 0421 12/24/20 0558  WBC 225.7* 183.5* 197.8*  NEUTROABS 11.6*  --   --   HGB 10.5* 10.5* 10.9*  HCT 37.9* 35.2* 38.3*  MCV 92.9 91.2 91.2  PLT 375 339 359    Cardiac Enzymes: No results for input(s): CKTOTAL, CKMB, CKMBINDEX, TROPONINI in the last 168 hours.  BNP (last 3 results) Recent Labs    12/08/20 0100  BNP 146.1*    ProBNP (last 3 results) No results for input(s): PROBNP in the last 8760 hours.  Radiological Exams: No results found.  Assessment/Plan Active Problems:   Acute on chronic respiratory failure with hypoxia (HCC)   COPD, severe (HCC)   Left lower  lobe pneumonia   Chronic lymphocytic leukemia in remission ([place])   1. Acute on chronic respiratory failure hypoxia we will continue with the T collar titrate oxygen continue pulmonary toilet the goal today is for 12 hours 2. Severe COPD medical management we will continue to follow 3. Left lobar pneumonia treated 4. CLL in remission we will continue to monitor   I have personally seen and evaluated the patient, evaluated laboratory and imaging results, formulated the assessment and plan and placed orders. The Patient requires high complexity decision making with multiple systems involvement.  Rounds were done with the Respiratory Therapy Director and Staff therapists and discussed with nursing staff also.  Allyne Gee, MD [facility] Pulmonary Critical Care Medicine Sleep Medicine

## 2020-12-27 DIAGNOSIS — C9111 Chronic lymphocytic leukemia of B-cell type in remission: Secondary | ICD-10-CM | POA: Diagnosis not present

## 2020-12-27 DIAGNOSIS — J189 Pneumonia, unspecified organism: Secondary | ICD-10-CM | POA: Diagnosis not present

## 2020-12-27 DIAGNOSIS — J9621 Acute and chronic respiratory failure with hypoxia: Secondary | ICD-10-CM | POA: Diagnosis not present

## 2020-12-27 DIAGNOSIS — J449 Chronic obstructive pulmonary disease, unspecified: Secondary | ICD-10-CM | POA: Diagnosis not present

## 2020-12-27 DIAGNOSIS — E46 Unspecified protein-calorie malnutrition: Secondary | ICD-10-CM | POA: Diagnosis not present

## 2020-12-27 DIAGNOSIS — J9690 Respiratory failure, unspecified, unspecified whether with hypoxia or hypercapnia: Secondary | ICD-10-CM | POA: Diagnosis not present

## 2020-12-27 DIAGNOSIS — I10 Essential (primary) hypertension: Secondary | ICD-10-CM | POA: Diagnosis not present

## 2020-12-27 LAB — FISH ONCOLOGY

## 2020-12-27 NOTE — Progress Notes (Signed)
Pulmonary Critical Care Medicine [place]   PULMONARY CRITICAL CARE SERVICE  PROGRESS NOTE  Date of Service: 12/27/2020  Isaac [name]  [id number]:[id number]  DOB: 07/08/1956   DOA: 12/19/2020  Referring Physician: Merton Border, MD  HPI: Isaac [name] is a 65 y.o. male seen for follow up of Acute on Chronic Respiratory Failure.  Patient currently is on assist control full support on 35% FiO2  Medications: Reviewed on Rounds  Physical Exam:  Vitals: Temperature is 98.4 pulse 88 respiratory 20 blood pressure is 158/74 saturations 93%  Ventilator Settings on assist control FiO2 35% PEEP 5 tidal volume 600  . General: Comfortable at this time . Eyes: Grossly normal lids, irises & conjunctiva . ENT: grossly tongue is normal . Neck: no obvious mass . Cardiovascular: S1 S2 normal no gallop . Respiratory: No rhonchi very coarse breath sound . Abdomen: soft . Skin: no rash seen on limited exam . Musculoskeletal: not rigid . Psychiatric:unable to assess . Neurologic: no seizure no involuntary movements         Lab Data:   Basic Metabolic Panel: Recent Labs  Lab 12/22/20 0421 12/24/20 0558 12/24/20 1312 12/24/20 1537 12/25/20 0430  NA 141 139  --   --   --   K 3.7 3.4* 6.9* 7.0* 4.1  CL 95* 94*  --   --   --   CO2 35* 33*  --   --   --   GLUCOSE 181* 129*  --   --   --   BUN 31* 32*  --   --   --   CREATININE 1.04 1.08  --   --   --   CALCIUM 8.6* 9.0  --   --   --     ABG: No results for input(s): PHART, PCO2ART, PO2ART, HCO3, O2SAT in the last 168 hours.  Liver Function Tests: No results for input(s): AST, ALT, ALKPHOS, BILITOT, PROT, ALBUMIN in the last 168 hours. No results for input(s): LIPASE, AMYLASE in the last 168 hours. No results for input(s): AMMONIA in the last 168 hours.  CBC: Recent Labs  Lab 12/22/20 0421 12/24/20 0558  WBC 183.5* 197.8*  HGB 10.5* 10.9*  HCT 35.2* 38.3*  MCV 91.2 91.2  PLT 339 359    Cardiac  Enzymes: No results for input(s): CKTOTAL, CKMB, CKMBINDEX, TROPONINI in the last 168 hours.  BNP (last 3 results) Recent Labs    12/08/20 0100  BNP 146.1*    ProBNP (last 3 results) No results for input(s): PROBNP in the last 8760 hours.  Radiological Exams: No results found.  Assessment/Plan Active Problems:   Acute on chronic respiratory failure with hypoxia (HCC)   COPD, severe (HCC)   Left lower lobe pneumonia   Chronic lymphocytic leukemia in remission ([place])   1. Acute on chronic respiratory failure hypoxia we will proceed to weaning 16-hour T collar today 2. Severe COPD medical management 3. Left lower lobe pneumonia treated 4. CLL no change we will continue to follow along   I have personally seen and evaluated the patient, evaluated laboratory and imaging results, formulated the assessment and plan and placed orders. The Patient requires high complexity decision making with multiple systems involvement.  Rounds were done with the Respiratory Therapy Director and Staff therapists and discussed with nursing staff also.  Allyne Gee, MD [facility] Pulmonary Critical Care Medicine Sleep Medicine

## 2020-12-28 DIAGNOSIS — J449 Chronic obstructive pulmonary disease, unspecified: Secondary | ICD-10-CM | POA: Diagnosis not present

## 2020-12-28 DIAGNOSIS — J9690 Respiratory failure, unspecified, unspecified whether with hypoxia or hypercapnia: Secondary | ICD-10-CM | POA: Diagnosis not present

## 2020-12-28 DIAGNOSIS — J189 Pneumonia, unspecified organism: Secondary | ICD-10-CM | POA: Diagnosis not present

## 2020-12-28 DIAGNOSIS — E46 Unspecified protein-calorie malnutrition: Secondary | ICD-10-CM | POA: Diagnosis not present

## 2020-12-28 DIAGNOSIS — I10 Essential (primary) hypertension: Secondary | ICD-10-CM | POA: Diagnosis not present

## 2020-12-28 DIAGNOSIS — C9111 Chronic lymphocytic leukemia of B-cell type in remission: Secondary | ICD-10-CM | POA: Diagnosis not present

## 2020-12-28 DIAGNOSIS — J9621 Acute and chronic respiratory failure with hypoxia: Secondary | ICD-10-CM | POA: Diagnosis not present

## 2020-12-28 NOTE — Progress Notes (Signed)
Pulmonary Critical Care Medicine [place]   PULMONARY CRITICAL CARE SERVICE  PROGRESS NOTE  Date of Service: 12/28/2020  Isaac [name]  [id number]  DOB: February 28, 1956   DOA: 12/19/2020  Referring Physician: Merton Border, MD  HPI: Isaac [name] is a 65 y.o. male seen for follow up of Acute on Chronic Respiratory Failure.  Patient currently is on 28% FiO2 on T-piece  Medications: Reviewed on Rounds  Physical Exam:  Vitals: Temperature is 96.5 pulse 100 respiratory is 20 blood pressure is 128/85 saturations 96%  Ventilator Settings currently on T collar 28% FiO2  . General: Comfortable at this time . Eyes: Grossly normal lids, irises & conjunctiva . ENT: grossly tongue is normal . Neck: no obvious mass . Cardiovascular: S1 S2 normal no gallop . Respiratory: Scattered rhonchi expansion is equal . Abdomen: soft . Skin: no rash seen on limited exam . Musculoskeletal: not rigid . Psychiatric:unable to assess . Neurologic: no seizure no involuntary movements         Lab Data:   Basic Metabolic Panel: Recent Labs  Lab 12/22/20 0421 12/24/20 0558 12/24/20 1312 12/24/20 1537 12/25/20 0430  NA 141 139  --   --   --   K 3.7 3.4* 6.9* 7.0* 4.1  CL 95* 94*  --   --   --   CO2 35* 33*  --   --   --   GLUCOSE 181* 129*  --   --   --   BUN 31* 32*  --   --   --   CREATININE 1.04 1.08  --   --   --   CALCIUM 8.6* 9.0  --   --   --     ABG: No results for input(s): PHART, PCO2ART, PO2ART, HCO3, O2SAT in the last 168 hours.  Liver Function Tests: No results for input(s): AST, ALT, ALKPHOS, BILITOT, PROT, ALBUMIN in the last 168 hours. No results for input(s): LIPASE, AMYLASE in the last 168 hours. No results for input(s): AMMONIA in the last 168 hours.  CBC: Recent Labs  Lab 12/22/20 0421 12/24/20 0558  WBC 183.5* 197.8*  HGB 10.5* 10.9*  HCT 35.2* 38.3*  MCV 91.2 91.2  PLT 339 359    Cardiac Enzymes: No results for input(s): CKTOTAL,  CKMB, CKMBINDEX, TROPONINI in the last 168 hours.  BNP (last 3 results) Recent Labs    12/08/20 0100  BNP 146.1*    ProBNP (last 3 results) No results for input(s): PROBNP in the last 8760 hours.  Radiological Exams: No results found.  Assessment/Plan Active Problems:   Acute on chronic respiratory failure with hypoxia (HCC)   COPD, severe (HCC)   Left lower lobe pneumonia   Chronic lymphocytic leukemia in remission ([place])   1. Acute on chronic respiratory failure hypoxia patient is on T collar right now on 24-hour goal 2. Severe COPD at baseline continue supportive care 3. Left lower lobe pneumonia treated improved 4. CLL patient is in remission   I have personally seen and evaluated the patient, evaluated laboratory and imaging results, formulated the assessment and plan and placed orders. The Patient requires high complexity decision making with multiple systems involvement.  Rounds were done with the Respiratory Therapy Director and Staff therapists and discussed with nursing staff also.  Allyne Gee, MD [facility] Pulmonary Critical Care Medicine Sleep Medicine

## 2020-12-29 ENCOUNTER — Other Ambulatory Visit (HOSPITAL_COMMUNITY): Payer: Medicare Other

## 2020-12-29 DIAGNOSIS — J189 Pneumonia, unspecified organism: Secondary | ICD-10-CM | POA: Diagnosis not present

## 2020-12-29 DIAGNOSIS — C9111 Chronic lymphocytic leukemia of B-cell type in remission: Secondary | ICD-10-CM | POA: Diagnosis not present

## 2020-12-29 DIAGNOSIS — J9690 Respiratory failure, unspecified, unspecified whether with hypoxia or hypercapnia: Secondary | ICD-10-CM | POA: Diagnosis not present

## 2020-12-29 DIAGNOSIS — J441 Chronic obstructive pulmonary disease with (acute) exacerbation: Secondary | ICD-10-CM | POA: Diagnosis not present

## 2020-12-29 DIAGNOSIS — E46 Unspecified protein-calorie malnutrition: Secondary | ICD-10-CM | POA: Diagnosis not present

## 2020-12-29 DIAGNOSIS — J9621 Acute and chronic respiratory failure with hypoxia: Secondary | ICD-10-CM | POA: Diagnosis not present

## 2020-12-29 DIAGNOSIS — I10 Essential (primary) hypertension: Secondary | ICD-10-CM | POA: Diagnosis not present

## 2020-12-29 DIAGNOSIS — J449 Chronic obstructive pulmonary disease, unspecified: Secondary | ICD-10-CM | POA: Diagnosis not present

## 2020-12-29 LAB — CBC
HCT: 41.9 % (ref 39.0–52.0)
Hemoglobin: 12.8 g/dL — ABNORMAL LOW (ref 13.0–17.0)
MCH: 27.2 pg (ref 26.0–34.0)
MCHC: 30.5 g/dL (ref 30.0–36.0)
MCV: 89 fL (ref 80.0–100.0)
Platelets: 376 10*3/uL (ref 150–400)
RBC: 4.71 MIL/uL (ref 4.22–5.81)
RDW: 13.6 % (ref 11.5–15.5)
WBC: 238.4 10*3/uL (ref 4.0–10.5)
nRBC: 0 % (ref 0.0–0.2)

## 2020-12-29 LAB — BASIC METABOLIC PANEL
Anion gap: 15 (ref 5–15)
BUN: 40 mg/dL — ABNORMAL HIGH (ref 8–23)
CO2: 33 mmol/L — ABNORMAL HIGH (ref 22–32)
Calcium: 9.6 mg/dL (ref 8.9–10.3)
Chloride: 93 mmol/L — ABNORMAL LOW (ref 98–111)
Creatinine, Ser: 1.13 mg/dL (ref 0.61–1.24)
GFR, Estimated: >60 mL/min (ref >60)
Glucose, Bld: 130 mg/dL — ABNORMAL HIGH (ref 70–99)
Potassium: 4 mmol/L (ref 3.5–5.1)
Sodium: 141 mmol/L (ref 135–145)

## 2020-12-29 LAB — BLOOD GAS, ARTERIAL
Acid-Base Excess: 10.6 mmol/L — ABNORMAL HIGH (ref 0.0–2.0)
Bicarbonate: 35.2 mmol/L — ABNORMAL HIGH (ref 20.0–28.0)
FIO2: 28
O2 Saturation: 93.2 %
Patient temperature: 36.6
pCO2 arterial: 51.7 mmHg — ABNORMAL HIGH (ref 32.0–48.0)
pH, Arterial: 7.446 (ref 7.350–7.450)
pO2, Arterial: 65.9 mmHg — ABNORMAL LOW (ref 83.0–108.0)

## 2020-12-29 NOTE — Progress Notes (Signed)
Pulmonary Critical Care Medicine [name]   PULMONARY CRITICAL CARE SERVICE  PROGRESS NOTE  Date of Service: 12/29/2020  Isaac [name]  KVQ:[id number]  DOB: [birth year]   DOA: 12/19/2020  Referring Physician: Merton Border, MD  HPI: Isaac [name] is a 65 y.o. male seen for follow up of Acute on Chronic Respiratory Failure.  Patient right now is on T collar 28% FiO2  Medications: Reviewed on Rounds  Physical Exam:  Vitals: Temperature is 97.4 pulse 81 respiratory rate is 26 blood pressure 155/90 saturations 98%  Ventilator Settings on T collar FiO2 28% goal is 24-hour  . General: Comfortable at this time . Eyes: Grossly normal lids, irises & conjunctiva . ENT: grossly tongue is normal . Neck: no obvious mass . Cardiovascular: S1 S2 normal no gallop . Respiratory: Scattered rhonchi expansion is equal . Abdomen: soft . Skin: no rash seen on limited exam . Musculoskeletal: not rigid . Psychiatric:unable to assess . Neurologic: no seizure no involuntary movements         Lab Data:   Basic Metabolic Panel: Recent Labs  Lab 12/24/20 0558 12/24/20 1312 12/24/20 1537 12/25/20 0430 12/29/20 0337  NA 139  --   --   --  141  K 3.4* 6.9* 7.0* 4.1 4.0  CL 94*  --   --   --  93*  CO2 33*  --   --   --  33*  GLUCOSE 129*  --   --   --  130*  BUN 32*  --   --   --  40*  CREATININE 1.08  --   --   --  1.13  CALCIUM 9.0  --   --   --  9.6    ABG: No results for input(s): PHART, PCO2ART, PO2ART, HCO3, O2SAT in the last 168 hours.  Liver Function Tests: No results for input(s): AST, ALT, ALKPHOS, BILITOT, PROT, ALBUMIN in the last 168 hours. No results for input(s): LIPASE, AMYLASE in the last 168 hours. No results for input(s): AMMONIA in the last 168 hours.  CBC: Recent Labs  Lab 12/24/20 0558 12/29/20 0337  WBC 197.8* 238.4*  HGB 10.9* 12.8*  HCT 38.3* 41.9  MCV 91.2 89.0  PLT 359 376    Cardiac Enzymes: No results for input(s):  CKTOTAL, CKMB, CKMBINDEX, TROPONINI in the last 168 hours.  BNP (last 3 results) Recent Labs    12/08/20 0100  BNP 146.1*    ProBNP (last 3 results) No results for input(s): PROBNP in the last 8760 hours.  Radiological Exams: No results found.  Assessment/Plan Active Problems:   Acute on chronic respiratory failure with hypoxia (HCC)   COPD, severe (HCC)   Left lower lobe pneumonia   Chronic lymphocytic leukemia in remission ([place])   1. Acute on chronic respiratory failure hypoxia we will continue with T collar 24-hour goal 2. Severe COPD at baseline 3. Left lower lobe pneumonia treated slow improvement 4. Chronic lymphocytic leukemia in remission   I have personally seen and evaluated the patient, evaluated laboratory and imaging results, formulated the assessment and plan and placed orders. The Patient requires high complexity decision making with multiple systems involvement.  Rounds were done with the Respiratory Therapy Director and Staff therapists and discussed with nursing staff also.  Isaac Gee, MD [facility] Pulmonary Critical Care Medicine Sleep Medicine

## 2020-12-30 DIAGNOSIS — J449 Chronic obstructive pulmonary disease, unspecified: Secondary | ICD-10-CM | POA: Diagnosis not present

## 2020-12-30 DIAGNOSIS — J9621 Acute and chronic respiratory failure with hypoxia: Secondary | ICD-10-CM | POA: Diagnosis not present

## 2020-12-30 DIAGNOSIS — J441 Chronic obstructive pulmonary disease with (acute) exacerbation: Secondary | ICD-10-CM | POA: Diagnosis not present

## 2020-12-30 DIAGNOSIS — C9111 Chronic lymphocytic leukemia of B-cell type in remission: Secondary | ICD-10-CM | POA: Diagnosis not present

## 2020-12-30 DIAGNOSIS — E46 Unspecified protein-calorie malnutrition: Secondary | ICD-10-CM | POA: Diagnosis not present

## 2020-12-30 DIAGNOSIS — I10 Essential (primary) hypertension: Secondary | ICD-10-CM | POA: Diagnosis not present

## 2020-12-30 DIAGNOSIS — J9600 Acute respiratory failure, unspecified whether with hypoxia or hypercapnia: Secondary | ICD-10-CM | POA: Diagnosis not present

## 2020-12-30 DIAGNOSIS — J189 Pneumonia, unspecified organism: Secondary | ICD-10-CM | POA: Diagnosis not present

## 2020-12-30 NOTE — Progress Notes (Signed)
Pulmonary Critical Care Medicine [place]   PULMONARY CRITICAL CARE SERVICE  PROGRESS NOTE  Date of Service: 12/30/2020  Isaac [name]  QHU:[id number]  DOB: 1956/05/17   DOA: 12/19/2020  Referring Physician: Merton Border, MD  HPI: Brenon BIAS is a 65 y.o. male seen for follow up of Acute on Chronic Respiratory Failure.  Patient currently is on T collar has been on 28% FiO2 with good saturations.  Medications: Reviewed on Rounds  Physical Exam:  Vitals: Temperature 97.1 pulse 89 respiratory 27 blood pressure is 167/87 saturations 95%  Ventilator Settings on T collar with an FiO2 of 28%  . General: Comfortable at this time . Eyes: Grossly normal lids, irises & conjunctiva . ENT: grossly tongue is normal . Neck: no obvious mass . Cardiovascular: S1 S2 normal no gallop . Respiratory: No rhonchi coarse breath sounds . Abdomen: soft . Skin: no rash seen on limited exam . Musculoskeletal: not rigid . Psychiatric:unable to assess . Neurologic: no seizure no involuntary movements         Lab Data:   Basic Metabolic Panel: Recent Labs  Lab 12/24/20 0558 12/24/20 1312 12/24/20 1537 12/25/20 0430 12/29/20 0337  NA 139  --   --   --  141  K 3.4* 6.9* 7.0* 4.1 4.0  CL 94*  --   --   --  93*  CO2 33*  --   --   --  33*  GLUCOSE 129*  --   --   --  130*  BUN 32*  --   --   --  40*  CREATININE 1.08  --   --   --  1.13  CALCIUM 9.0  --   --   --  9.6    ABG: Recent Labs  Lab 12/29/20 0830  PHART 7.446  PCO2ART 51.7*  PO2ART 65.9*  HCO3 35.2*  O2SAT 93.2    Liver Function Tests: No results for input(s): AST, ALT, ALKPHOS, BILITOT, PROT, ALBUMIN in the last 168 hours. No results for input(s): LIPASE, AMYLASE in the last 168 hours. No results for input(s): AMMONIA in the last 168 hours.  CBC: Recent Labs  Lab 12/24/20 0558 12/29/20 0337  WBC 197.8* 238.4*  HGB 10.9* 12.8*  HCT 38.3* 41.9  MCV 91.2 89.0  PLT 359 376    Cardiac  Enzymes: No results for input(s): CKTOTAL, CKMB, CKMBINDEX, TROPONINI in the last 168 hours.  BNP (last 3 results) Recent Labs    12/08/20 0100  BNP 146.1*    ProBNP (last 3 results) No results for input(s): PROBNP in the last 8760 hours.  Radiological Exams: DG Abd 1 View  Result Date: 12/29/2020 CLINICAL DATA:  Abdominal pain EXAM: ABDOMEN - 1 VIEW COMPARISON:  12/19/2020 FINDINGS: Feeding tube in the region of the gastric antrum. Gas in nondilated large and small bowel loops. Gas in the rectum. IMPRESSION: Feeding tube tip in the gastric antrum. Nonobstructive bowel gas pattern. Electronically Signed   By: Franchot Gallo M.D.   On: 12/29/2020 17:49    Assessment/Plan Active Problems:   Acute on chronic respiratory failure with hypoxia (HCC)   COPD, severe (HCC)   Left lower lobe pneumonia   Chronic lymphocytic leukemia in remission ([place])   1. Acute on chronic respiratory failure hypoxia we will continue with the T-piece titrate oxygen continue pulmonary toilet 2. Severe COPD patient is at baseline 3. Left lower lobe pneumonia treated slow improvement 4. CLL currently is in remission  I have personally seen and evaluated the patient, evaluated laboratory and imaging results, formulated the assessment and plan and placed orders. The Patient requires high complexity decision making with multiple systems involvement.  Rounds were done with the Respiratory Therapy Director and Staff therapists and discussed with nursing staff also.  Allyne Gee, MD [facility] Pulmonary Critical Care Medicine Sleep Medicine

## 2020-12-31 DIAGNOSIS — J189 Pneumonia, unspecified organism: Secondary | ICD-10-CM | POA: Diagnosis not present

## 2020-12-31 DIAGNOSIS — C9111 Chronic lymphocytic leukemia of B-cell type in remission: Secondary | ICD-10-CM | POA: Diagnosis not present

## 2020-12-31 DIAGNOSIS — I10 Essential (primary) hypertension: Secondary | ICD-10-CM | POA: Diagnosis not present

## 2020-12-31 DIAGNOSIS — J9621 Acute and chronic respiratory failure with hypoxia: Secondary | ICD-10-CM | POA: Diagnosis not present

## 2020-12-31 DIAGNOSIS — L89159 Pressure ulcer of sacral region, unspecified stage: Secondary | ICD-10-CM | POA: Diagnosis not present

## 2020-12-31 DIAGNOSIS — J449 Chronic obstructive pulmonary disease, unspecified: Secondary | ICD-10-CM | POA: Diagnosis not present

## 2020-12-31 DIAGNOSIS — J9600 Acute respiratory failure, unspecified whether with hypoxia or hypercapnia: Secondary | ICD-10-CM | POA: Diagnosis not present

## 2020-12-31 NOTE — Progress Notes (Signed)
Pulmonary Critical Care Medicine [place]   PULMONARY CRITICAL CARE SERVICE  PROGRESS NOTE  Date of Service: 12/31/2020  KADAN MILLSTEIN  RSW:[id number]  DOB: 04/22/1956   DOA: 12/19/2020  Referring Physician: Merton Border, MD  HPI: KEIN CARLBERG is a 65 y.o. male seen for follow up of Acute on Chronic Respiratory Failure.  Patient is on T collar right now on 28% FiO2 with good saturation  Medications: Reviewed on Rounds  Physical Exam:  Vitals: Temperature is 97.3 pulse 99 respiratory rate is 19 blood pressure is 154/85 saturations 94  Ventilator Settings on T collar with an FiO2 of 28%  . General: Comfortable at this time . Eyes: Grossly normal lids, irises & conjunctiva . ENT: grossly tongue is normal . Neck: no obvious mass . Cardiovascular: S1 S2 normal no gallop . Respiratory: Scattered rhonchi expansion is equal . Abdomen: soft . Skin: no rash seen on limited exam . Musculoskeletal: not rigid . Psychiatric:unable to assess . Neurologic: no seizure no involuntary movements         Lab Data:   Basic Metabolic Panel: Recent Labs  Lab 12/24/20 1312 12/24/20 1537 12/25/20 0430 12/29/20 0337  NA  --   --   --  141  K 6.9* 7.0* 4.1 4.0  CL  --   --   --  93*  CO2  --   --   --  33*  GLUCOSE  --   --   --  130*  BUN  --   --   --  40*  CREATININE  --   --   --  1.13  CALCIUM  --   --   --  9.6    ABG: Recent Labs  Lab 12/29/20 0830  PHART 7.446  PCO2ART 51.7*  PO2ART 65.9*  HCO3 35.2*  O2SAT 93.2    Liver Function Tests: No results for input(s): AST, ALT, ALKPHOS, BILITOT, PROT, ALBUMIN in the last 168 hours. No results for input(s): LIPASE, AMYLASE in the last 168 hours. No results for input(s): AMMONIA in the last 168 hours.  CBC: Recent Labs  Lab 12/29/20 0337  WBC 238.4*  HGB 12.8*  HCT 41.9  MCV 89.0  PLT 376    Cardiac Enzymes: No results for input(s): CKTOTAL, CKMB, CKMBINDEX, TROPONINI in the last 168  hours.  BNP (last 3 results) Recent Labs    12/08/20 0100  BNP 146.1*    ProBNP (last 3 results) No results for input(s): PROBNP in the last 8760 hours.  Radiological Exams: DG Abd 1 View  Result Date: 12/29/2020 CLINICAL DATA:  Abdominal pain EXAM: ABDOMEN - 1 VIEW COMPARISON:  12/19/2020 FINDINGS: Feeding tube in the region of the gastric antrum. Gas in nondilated large and small bowel loops. Gas in the rectum. IMPRESSION: Feeding tube tip in the gastric antrum. Nonobstructive bowel gas pattern. Electronically Signed   By: Franchot Gallo M.D.   On: 12/29/2020 17:49    Assessment/Plan Active Problems:   Acute on chronic respiratory failure with hypoxia (HCC)   COPD, severe (HCC)   Left lower lobe pneumonia   Chronic lymphocytic leukemia in remission ([place])   1. Acute on chronic respiratory failure hypoxia we will continue with T collar titrate oxygen continue pulmonary toilet. 2. Severe COPD we will continue with medical management 3. Left lower lobe pneumonia treated with antibiotics 4. Chronic lymphocytic leukemia no change we will continue to follow   I have personally seen and evaluated the patient,  evaluated laboratory and imaging results, formulated the assessment and plan and placed orders. The Patient requires high complexity decision making with multiple systems involvement.  Rounds were done with the Respiratory Therapy Director and Staff therapists and discussed with nursing staff also.  Allyne Gee, MD [facility] Pulmonary Critical Care Medicine Sleep Medicine

## 2021-01-01 DIAGNOSIS — J9600 Acute respiratory failure, unspecified whether with hypoxia or hypercapnia: Secondary | ICD-10-CM | POA: Diagnosis not present

## 2021-01-01 DIAGNOSIS — J449 Chronic obstructive pulmonary disease, unspecified: Secondary | ICD-10-CM | POA: Diagnosis not present

## 2021-01-01 DIAGNOSIS — E46 Unspecified protein-calorie malnutrition: Secondary | ICD-10-CM | POA: Diagnosis not present

## 2021-01-01 DIAGNOSIS — I10 Essential (primary) hypertension: Secondary | ICD-10-CM | POA: Diagnosis not present

## 2021-01-02 DIAGNOSIS — C9111 Chronic lymphocytic leukemia of B-cell type in remission: Secondary | ICD-10-CM | POA: Diagnosis not present

## 2021-01-02 DIAGNOSIS — J449 Chronic obstructive pulmonary disease, unspecified: Secondary | ICD-10-CM | POA: Diagnosis not present

## 2021-01-02 DIAGNOSIS — J9600 Acute respiratory failure, unspecified whether with hypoxia or hypercapnia: Secondary | ICD-10-CM | POA: Diagnosis not present

## 2021-01-02 DIAGNOSIS — E46 Unspecified protein-calorie malnutrition: Secondary | ICD-10-CM | POA: Diagnosis not present

## 2021-01-02 DIAGNOSIS — J441 Chronic obstructive pulmonary disease with (acute) exacerbation: Secondary | ICD-10-CM | POA: Diagnosis not present

## 2021-01-02 DIAGNOSIS — I10 Essential (primary) hypertension: Secondary | ICD-10-CM | POA: Diagnosis not present

## 2021-01-02 DIAGNOSIS — J189 Pneumonia, unspecified organism: Secondary | ICD-10-CM | POA: Diagnosis not present

## 2021-01-02 DIAGNOSIS — J9621 Acute and chronic respiratory failure with hypoxia: Secondary | ICD-10-CM | POA: Diagnosis not present

## 2021-01-02 LAB — BASIC METABOLIC PANEL
Anion gap: 10 (ref 5–15)
BUN: 38 mg/dL — ABNORMAL HIGH (ref 8–23)
CO2: 36 mmol/L — ABNORMAL HIGH (ref 22–32)
Calcium: 9.4 mg/dL (ref 8.9–10.3)
Chloride: 93 mmol/L — ABNORMAL LOW (ref 98–111)
Creatinine, Ser: 0.88 mg/dL (ref 0.61–1.24)
GFR, Estimated: >60 mL/min (ref >60)
Glucose, Bld: 128 mg/dL — ABNORMAL HIGH (ref 70–99)
Potassium: 3.3 mmol/L — ABNORMAL LOW (ref 3.5–5.1)
Sodium: 139 mmol/L (ref 135–145)

## 2021-01-02 LAB — MAGNESIUM: Magnesium: 2.2 mg/dL (ref 1.7–2.4)

## 2021-01-02 NOTE — Progress Notes (Signed)
Pulmonary Critical Care Medicine [place]   PULMONARY CRITICAL CARE SERVICE  PROGRESS NOTE  Date of Service: 01/02/2021  Isaac [name]  XBM:[id number]  DOB: 02/23/1956   DOA: 12/19/2020  Referring Physician: Merton Border, MD  HPI: Isaac [name] is a 65 y.o. male seen for follow up of Acute on Chronic Respiratory Failure.  At this time patient is on T collar has been on 28% oxygen with good saturations.  Medications: Reviewed on Rounds  Physical Exam:  Vitals: Temperature is 97.5 pulse 100 respiratory 29 blood pressure is 138/85 saturations 99%  Ventilator Settings on T collar FiO2 28%  . General: Comfortable at this time . Eyes: Grossly normal lids, irises & conjunctiva . ENT: grossly tongue is normal . Neck: no obvious mass . Cardiovascular: S1 S2 normal no gallop . Respiratory: Coarse breath sounds with few scattered rhonchi . Abdomen: soft . Skin: no rash seen on limited exam . Musculoskeletal: not rigid . Psychiatric:unable to assess . Neurologic: no seizure no involuntary movements         Lab Data:   Basic Metabolic Panel: Recent Labs  Lab 12/29/20 0337 01/02/21 0347  NA 141 139  K 4.0 3.3*  CL 93* 93*  CO2 33* 36*  GLUCOSE 130* 128*  BUN 40* 38*  CREATININE 1.13 0.88  CALCIUM 9.6 9.4  MG  --  2.2    ABG: Recent Labs  Lab 12/29/20 0830  PHART 7.446  PCO2ART 51.7*  PO2ART 65.9*  HCO3 35.2*  O2SAT 93.2    Liver Function Tests: No results for input(s): AST, ALT, ALKPHOS, BILITOT, PROT, ALBUMIN in the last 168 hours. No results for input(s): LIPASE, AMYLASE in the last 168 hours. No results for input(s): AMMONIA in the last 168 hours.  CBC: Recent Labs  Lab 12/29/20 0337  WBC 238.4*  HGB 12.8*  HCT 41.9  MCV 89.0  PLT 376    Cardiac Enzymes: No results for input(s): CKTOTAL, CKMB, CKMBINDEX, TROPONINI in the last 168 hours.  BNP (last 3 results) Recent Labs    12/08/20 0100  BNP 146.1*    ProBNP  (last 3 results) No results for input(s): PROBNP in the last 8760 hours.  Radiological Exams: No results found.  Assessment/Plan Active Problems:   Acute on chronic respiratory failure with hypoxia (HCC)   COPD, severe (HCC)   Left lower lobe pneumonia   Chronic lymphocytic leukemia in remission (Isaac [name])   1. Acute on chronic respiratory failure hypoxia we will continue with T collar trials currently on 20% FiO2 2. Severe COPD patient is at baseline we will continue to follow 3. Left lower lobe pneumonia treated slow improvement 4. CLL in remission we will continue to follow along.   I have personally seen and evaluated the patient, evaluated laboratory and imaging results, formulated the assessment and plan and placed orders. The Patient requires high complexity decision making with multiple systems involvement.  Rounds were done with the Respiratory Therapy Director and Staff therapists and discussed with nursing staff also.  Allyne Gee, MD [facility] Pulmonary Critical Care Medicine Sleep Medicine

## 2021-01-03 ENCOUNTER — Other Ambulatory Visit (HOSPITAL_COMMUNITY): Payer: Medicare Other

## 2021-01-03 DIAGNOSIS — J9600 Acute respiratory failure, unspecified whether with hypoxia or hypercapnia: Secondary | ICD-10-CM | POA: Diagnosis not present

## 2021-01-03 DIAGNOSIS — J449 Chronic obstructive pulmonary disease, unspecified: Secondary | ICD-10-CM | POA: Diagnosis not present

## 2021-01-03 DIAGNOSIS — J9621 Acute and chronic respiratory failure with hypoxia: Secondary | ICD-10-CM | POA: Diagnosis not present

## 2021-01-03 DIAGNOSIS — E46 Unspecified protein-calorie malnutrition: Secondary | ICD-10-CM | POA: Diagnosis not present

## 2021-01-03 DIAGNOSIS — C9111 Chronic lymphocytic leukemia of B-cell type in remission: Secondary | ICD-10-CM | POA: Diagnosis not present

## 2021-01-03 DIAGNOSIS — I10 Essential (primary) hypertension: Secondary | ICD-10-CM | POA: Diagnosis not present

## 2021-01-03 DIAGNOSIS — J189 Pneumonia, unspecified organism: Secondary | ICD-10-CM | POA: Diagnosis not present

## 2021-01-03 LAB — POTASSIUM: Potassium: 4.7 mmol/L (ref 3.5–5.1)

## 2021-01-03 NOTE — Progress Notes (Signed)
Pulmonary Critical Care Medicine [place]   PULMONARY CRITICAL CARE SERVICE  PROGRESS NOTE  Date of Service: 01/03/2021  Isaac [name]  YKD:[id number]  DOB: 1956/04/24   DOA: 12/19/2020  Referring Physician: Merton Border, MD  HPI: Isaac [name] is a 65 y.o. male seen for follow up of Acute on Chronic Respiratory Failure.  Patient currently is on T collar has been on 35% FiO2 using PMV  Medications: Reviewed on Rounds  Physical Exam:  Vitals: Temperature is 97.3 pulse 77 respiratory 20 blood pressure is 148/81 saturations 96%  Ventilator Settings off the ventilator on T collar  . General: Comfortable at this time . Eyes: Grossly normal lids, irises & conjunctiva . ENT: grossly tongue is normal . Neck: no obvious mass . Cardiovascular: S1 S2 normal no gallop . Respiratory: No rhonchi very coarse breath sounds . Abdomen: soft . Skin: no rash seen on limited exam . Musculoskeletal: not rigid . Psychiatric:unable to assess . Neurologic: no seizure no involuntary movements         Lab Data:   Basic Metabolic Panel: Recent Labs  Lab 12/29/20 0337 01/02/21 0347  NA 141 139  K 4.0 3.3*  CL 93* 93*  CO2 33* 36*  GLUCOSE 130* 128*  BUN 40* 38*  CREATININE 1.13 0.88  CALCIUM 9.6 9.4  MG  --  2.2    ABG: Recent Labs  Lab 12/29/20 0830  PHART 7.446  PCO2ART 51.7*  PO2ART 65.9*  HCO3 35.2*  O2SAT 93.2    Liver Function Tests: No results for input(s): AST, ALT, ALKPHOS, BILITOT, PROT, ALBUMIN in the last 168 hours. No results for input(s): LIPASE, AMYLASE in the last 168 hours. No results for input(s): AMMONIA in the last 168 hours.  CBC: Recent Labs  Lab 12/29/20 0337  WBC 238.4*  HGB 12.8*  HCT 41.9  MCV 89.0  PLT 376    Cardiac Enzymes: No results for input(s): CKTOTAL, CKMB, CKMBINDEX, TROPONINI in the last 168 hours.  BNP (last 3 results) Recent Labs    12/08/20 0100  BNP 146.1*    ProBNP (last 3 results) No  results for input(s): PROBNP in the last 8760 hours.  Radiological Exams: No results found.  Assessment/Plan Active Problems:   Acute on chronic respiratory failure with hypoxia (HCC)   COPD, severe (HCC)   Left lower lobe pneumonia   Chronic lymphocytic leukemia in remission ([place])   1. Acute on chronic respiratory failure hypoxia we will continue with T collar trials titrate oxygen continue pulmonary toilet.  Patient has been doing well with PMV supposed to have speech evaluation today 2. Severe COPD at baseline we will continue with supportive care 3. Left lower lobe pneumonia treated improving 4. CLL in remission   I have personally seen and evaluated the patient, evaluated laboratory and imaging results, formulated the assessment and plan and placed orders. The Patient requires high complexity decision making with multiple systems involvement.  Rounds were done with the Respiratory Therapy Director and Staff therapists and discussed with nursing staff also.  Allyne Gee, MD [facility] Pulmonary Critical Care Medicine Sleep Medicine

## 2021-01-04 DIAGNOSIS — J189 Pneumonia, unspecified organism: Secondary | ICD-10-CM | POA: Diagnosis not present

## 2021-01-04 DIAGNOSIS — I10 Essential (primary) hypertension: Secondary | ICD-10-CM | POA: Diagnosis not present

## 2021-01-04 DIAGNOSIS — J9600 Acute respiratory failure, unspecified whether with hypoxia or hypercapnia: Secondary | ICD-10-CM | POA: Diagnosis not present

## 2021-01-04 DIAGNOSIS — C9111 Chronic lymphocytic leukemia of B-cell type in remission: Secondary | ICD-10-CM | POA: Diagnosis not present

## 2021-01-04 DIAGNOSIS — J449 Chronic obstructive pulmonary disease, unspecified: Secondary | ICD-10-CM | POA: Diagnosis not present

## 2021-01-04 DIAGNOSIS — L89159 Pressure ulcer of sacral region, unspecified stage: Secondary | ICD-10-CM | POA: Diagnosis not present

## 2021-01-04 DIAGNOSIS — J9621 Acute and chronic respiratory failure with hypoxia: Secondary | ICD-10-CM | POA: Diagnosis not present

## 2021-01-04 NOTE — Progress Notes (Signed)
Pulmonary Critical Care Medicine [place]   PULMONARY CRITICAL CARE SERVICE  PROGRESS NOTE  Date of Service: 01/04/2021  Isaac [name]  [id number]  DOB: July 13, 1956   DOA: 12/19/2020  Referring Physician: Merton Border, MD  HPI: Isaac [name] is a 65 y.o. male seen for follow up of Acute on Chronic Respiratory Failure.  Patient at this time is on T collar on 35% FiO2 apparently passed the speech evaluation  Medications: Reviewed on Rounds  Physical Exam:  Vitals: Temperature 96.9 pulse 77 respiratory 18 blood pressure is 140/80 saturations 99%  Ventilator Settings on T collar FiO2 28%  . General: Comfortable at this time . Eyes: Grossly normal lids, irises & conjunctiva . ENT: grossly tongue is normal . Neck: no obvious mass . Cardiovascular: S1 S2 normal no gallop . Respiratory: Coarse breath sounds with few scattered rhonchi . Abdomen: soft . Skin: no rash seen on limited exam . Musculoskeletal: not rigid . Psychiatric:unable to assess . Neurologic: no seizure no involuntary movements         Lab Data:   Basic Metabolic Panel: Recent Labs  Lab 12/29/20 0337 01/02/21 0347 01/03/21 0847  NA 141 139  --   K 4.0 3.3* 4.7  CL 93* 93*  --   CO2 33* 36*  --   GLUCOSE 130* 128*  --   BUN 40* 38*  --   CREATININE 1.13 0.88  --   CALCIUM 9.6 9.4  --   MG  --  2.2  --     ABG: Recent Labs  Lab 12/29/20 0830  PHART 7.446  PCO2ART 51.7*  PO2ART 65.9*  HCO3 35.2*  O2SAT 93.2    Liver Function Tests: No results for input(s): AST, ALT, ALKPHOS, BILITOT, PROT, ALBUMIN in the last 168 hours. No results for input(s): LIPASE, AMYLASE in the last 168 hours. No results for input(s): AMMONIA in the last 168 hours.  CBC: Recent Labs  Lab 12/29/20 0337  WBC 238.4*  HGB 12.8*  HCT 41.9  MCV 89.0  PLT 376    Cardiac Enzymes: No results for input(s): CKTOTAL, CKMB, CKMBINDEX, TROPONINI in the last 168 hours.  BNP (last 3  results) Recent Labs    12/08/20 0100  BNP 146.1*    ProBNP (last 3 results) No results for input(s): PROBNP in the last 8760 hours.  Radiological Exams: No results found.  Assessment/Plan Active Problems:   Acute on chronic respiratory failure with hypoxia (HCC)   COPD, severe (HCC)   Left lower lobe pneumonia   Chronic lymphocytic leukemia in remission ([place])   1. Acute on chronic respiratory failure hypoxia we will continue with T collar trials with attempt at capping today 2. Severe COPD we will continue with medical management 3. Left lower lobe pneumonia treated slowly improving 4. CLL patient is at baseline in remission   I have personally seen and evaluated the patient, evaluated laboratory and imaging results, formulated the assessment and plan and placed orders. The Patient requires high complexity decision making with multiple systems involvement.  Rounds were done with the Respiratory Therapy Director and Staff therapists and discussed with nursing staff also.  Allyne Gee, MD [facility] Pulmonary Critical Care Medicine Sleep Medicine

## 2021-01-05 DIAGNOSIS — L89159 Pressure ulcer of sacral region, unspecified stage: Secondary | ICD-10-CM | POA: Diagnosis not present

## 2021-01-05 DIAGNOSIS — I10 Essential (primary) hypertension: Secondary | ICD-10-CM | POA: Diagnosis not present

## 2021-01-05 DIAGNOSIS — J9621 Acute and chronic respiratory failure with hypoxia: Secondary | ICD-10-CM | POA: Diagnosis not present

## 2021-01-05 DIAGNOSIS — J449 Chronic obstructive pulmonary disease, unspecified: Secondary | ICD-10-CM | POA: Diagnosis not present

## 2021-01-05 DIAGNOSIS — J189 Pneumonia, unspecified organism: Secondary | ICD-10-CM | POA: Diagnosis not present

## 2021-01-05 DIAGNOSIS — J9600 Acute respiratory failure, unspecified whether with hypoxia or hypercapnia: Secondary | ICD-10-CM | POA: Diagnosis not present

## 2021-01-05 DIAGNOSIS — C9111 Chronic lymphocytic leukemia of B-cell type in remission: Secondary | ICD-10-CM | POA: Diagnosis not present

## 2021-01-05 DIAGNOSIS — J441 Chronic obstructive pulmonary disease with (acute) exacerbation: Secondary | ICD-10-CM | POA: Diagnosis not present

## 2021-01-05 NOTE — Progress Notes (Signed)
Pulmonary Critical Care Medicine [place]   PULMONARY CRITICAL CARE SERVICE  PROGRESS NOTE  Date of Service: 01/05/2021  Isaac [name]  [id number]:[id number]  DOB: 1956-10-26   DOA: 12/19/2020  Referring Physician: Merton Border, MD  HPI: Isaac [name] is a 65 y.o. male seen for follow up of Acute on Chronic Respiratory Failure.  Patient is capping currently on 2 L of oxygen  Medications: Reviewed on Rounds  Physical Exam:  Vitals: Temperature is 96.6 pulse 77 respiratory 18 blood pressure is 132/77 saturations 98%  Ventilator Settings capping on 2 L  . General: Comfortable at this time . Eyes: Grossly normal lids, irises & conjunctiva . ENT: grossly tongue is normal . Neck: no obvious mass . Cardiovascular: S1 S2 normal no gallop . Respiratory: Scattered rhonchi expansion is equal . Abdomen: soft . Skin: no rash seen on limited exam . Musculoskeletal: not rigid . Psychiatric:unable to assess . Neurologic: no seizure no involuntary movements         Lab Data:   Basic Metabolic Panel: Recent Labs  Lab 01/02/21 0347 01/03/21 0847  NA 139  --   K 3.3* 4.7  CL 93*  --   CO2 36*  --   GLUCOSE 128*  --   BUN 38*  --   CREATININE 0.88  --   CALCIUM 9.4  --   MG 2.2  --     ABG: Recent Labs  Lab 12/29/20 0830  PHART 7.446  PCO2ART 51.7*  PO2ART 65.9*  HCO3 35.2*  O2SAT 93.2    Liver Function Tests: No results for input(s): AST, ALT, ALKPHOS, BILITOT, PROT, ALBUMIN in the last 168 hours. No results for input(s): LIPASE, AMYLASE in the last 168 hours. No results for input(s): AMMONIA in the last 168 hours.  CBC: No results for input(s): WBC, NEUTROABS, HGB, HCT, MCV, PLT in the last 168 hours.  Cardiac Enzymes: No results for input(s): CKTOTAL, CKMB, CKMBINDEX, TROPONINI in the last 168 hours.  BNP (last 3 results) Recent Labs    12/08/20 0100  BNP 146.1*    ProBNP (last 3 results) No results for input(s): PROBNP in the last  8760 hours.  Radiological Exams: No results found.  Assessment/Plan Active Problems:   Acute on chronic respiratory failure with hypoxia (HCC)   COPD, severe (HCC)   Left lower lobe pneumonia   Chronic lymphocytic leukemia in remission ([place])   1. Acute on chronic respiratory failure hypoxia we will continue with capping trials titrate as tolerated. 2. Severe COPD medical management 3. Left lower lobe pneumonia treated improved 4. CLL no change   I have personally seen and evaluated the patient, evaluated laboratory and imaging results, formulated the assessment and plan and placed orders. The Patient requires high complexity decision making with multiple systems involvement.  Rounds were done with the Respiratory Therapy Director and Staff therapists and discussed with nursing staff also.  Allyne Gee, MD [facility] Pulmonary Critical Care Medicine Sleep Medicine

## 2021-01-06 DIAGNOSIS — J189 Pneumonia, unspecified organism: Secondary | ICD-10-CM | POA: Diagnosis not present

## 2021-01-06 DIAGNOSIS — L89159 Pressure ulcer of sacral region, unspecified stage: Secondary | ICD-10-CM | POA: Diagnosis not present

## 2021-01-06 DIAGNOSIS — C9111 Chronic lymphocytic leukemia of B-cell type in remission: Secondary | ICD-10-CM | POA: Diagnosis not present

## 2021-01-06 DIAGNOSIS — J9621 Acute and chronic respiratory failure with hypoxia: Secondary | ICD-10-CM | POA: Diagnosis not present

## 2021-01-06 DIAGNOSIS — J449 Chronic obstructive pulmonary disease, unspecified: Secondary | ICD-10-CM | POA: Diagnosis not present

## 2021-01-06 DIAGNOSIS — J9600 Acute respiratory failure, unspecified whether with hypoxia or hypercapnia: Secondary | ICD-10-CM | POA: Diagnosis not present

## 2021-01-06 DIAGNOSIS — I10 Essential (primary) hypertension: Secondary | ICD-10-CM | POA: Diagnosis not present

## 2021-01-06 LAB — BASIC METABOLIC PANEL
Anion gap: 10 (ref 5–15)
BUN: 18 mg/dL (ref 8–23)
CO2: 34 mmol/L — ABNORMAL HIGH (ref 22–32)
Calcium: 8.8 mg/dL — ABNORMAL LOW (ref 8.9–10.3)
Chloride: 94 mmol/L — ABNORMAL LOW (ref 98–111)
Creatinine, Ser: 0.89 mg/dL (ref 0.61–1.24)
GFR, Estimated: >60 mL/min (ref >60)
Glucose, Bld: 179 mg/dL — ABNORMAL HIGH (ref 70–99)
Potassium: 5.2 mmol/L — ABNORMAL HIGH (ref 3.5–5.1)
Sodium: 138 mmol/L (ref 135–145)

## 2021-01-06 LAB — CBC
HCT: 36 % — ABNORMAL LOW (ref 39.0–52.0)
Hemoglobin: 10.6 g/dL — ABNORMAL LOW (ref 13.0–17.0)
MCH: 26.8 pg (ref 26.0–34.0)
MCHC: 29.4 g/dL — ABNORMAL LOW (ref 30.0–36.0)
MCV: 90.9 fL (ref 80.0–100.0)
Platelets: 275 10*3/uL (ref 150–400)
RBC: 3.96 MIL/uL — ABNORMAL LOW (ref 4.22–5.81)
RDW: 13.8 % (ref 11.5–15.5)
WBC: 209.4 10*3/uL (ref 4.0–10.5)
nRBC: 0 % (ref 0.0–0.2)

## 2021-01-06 NOTE — Progress Notes (Signed)
Pulmonary Critical Care Medicine [place]   PULMONARY CRITICAL CARE SERVICE  PROGRESS NOTE  Date of Service: 01/06/2021  Isaac [name]  YTK:[id number]  DOB: 1956-02-19   DOA: 12/19/2020  Referring Physician: Merton Border, MD  HPI: Isaac [name] is a 65 y.o. male seen for follow up of Acute on Chronic Respiratory Failure.  Patient currently is capping on 2 L of oxygen the goal is for 48 hours  Medications: Reviewed on Rounds  Physical Exam:  Vitals: Temperature is 98.1 pulse 89 respiratory 18 blood pressure is 151/88 saturations 98%  Ventilator Settings capping on oxygen 2 L  . General: Comfortable at this time . Eyes: Grossly normal lids, irises & conjunctiva . ENT: grossly tongue is normal . Neck: no obvious mass . Cardiovascular: S1 S2 normal no gallop . Respiratory: Coarse breath sounds with few scattered rhonchi . Abdomen: soft . Skin: no rash seen on limited exam . Musculoskeletal: not rigid . Psychiatric:unable to assess . Neurologic: no seizure no involuntary movements         Lab Data:   Basic Metabolic Panel: Recent Labs  Lab 01/02/21 0347 01/03/21 0847 01/06/21 0402  NA 139  --  138  K 3.3* 4.7 5.2*  CL 93*  --  94*  CO2 36*  --  34*  GLUCOSE 128*  --  179*  BUN 38*  --  18  CREATININE 0.88  --  0.89  CALCIUM 9.4  --  8.8*  MG 2.2  --   --     ABG: No results for input(s): PHART, PCO2ART, PO2ART, HCO3, O2SAT in the last 168 hours.  Liver Function Tests: No results for input(s): AST, ALT, ALKPHOS, BILITOT, PROT, ALBUMIN in the last 168 hours. No results for input(s): LIPASE, AMYLASE in the last 168 hours. No results for input(s): AMMONIA in the last 168 hours.  CBC: Recent Labs  Lab 01/06/21 0402  WBC 209.4*  HGB 10.6*  HCT 36.0*  MCV 90.9  PLT 275    Cardiac Enzymes: No results for input(s): CKTOTAL, CKMB, CKMBINDEX, TROPONINI in the last 168 hours.  BNP (last 3 results) Recent Labs    12/08/20 0100   BNP 146.1*    ProBNP (last 3 results) No results for input(s): PROBNP in the last 8760 hours.  Radiological Exams: No results found.  Assessment/Plan Active Problems:   Acute on chronic respiratory failure with hypoxia (HCC)   COPD, severe (HCC)   Left lower lobe pneumonia   Chronic lymphocytic leukemia in remission ([place])   1. Acute on chronic respiratory failure hypoxia we will continue with capping trials patient's goal is 24 hours 2. Severe COPD continue with medical management. 3. Left lower lobe pneumonia no change supportive care 4. CLL treated in remission   I have personally seen and evaluated the patient, evaluated laboratory and imaging results, formulated the assessment and plan and placed orders. The Patient requires high complexity decision making with multiple systems involvement.  Rounds were done with the Respiratory Therapy Director and Staff therapists and discussed with nursing staff also.  Allyne Gee, MD [facility] Pulmonary Critical Care Medicine Sleep Medicine

## 2021-01-07 LAB — POTASSIUM: Potassium: 5.5 mmol/L — ABNORMAL HIGH (ref 3.5–5.1)

## 2021-01-08 DIAGNOSIS — J9621 Acute and chronic respiratory failure with hypoxia: Secondary | ICD-10-CM | POA: Diagnosis not present

## 2021-01-08 DIAGNOSIS — C9111 Chronic lymphocytic leukemia of B-cell type in remission: Secondary | ICD-10-CM | POA: Diagnosis not present

## 2021-01-08 DIAGNOSIS — J189 Pneumonia, unspecified organism: Secondary | ICD-10-CM | POA: Diagnosis not present

## 2021-01-08 DIAGNOSIS — J449 Chronic obstructive pulmonary disease, unspecified: Secondary | ICD-10-CM | POA: Diagnosis not present

## 2021-01-08 LAB — POTASSIUM: Potassium: 4.6 mmol/L (ref 3.5–5.1)

## 2021-01-08 NOTE — Progress Notes (Signed)
Pulmonary Critical Care Medicine [place]   PULMONARY CRITICAL CARE SERVICE  PROGRESS NOTE  Date of Service: 01/08/2021  Isaac [name]  [id number]:[id number]  DOB: Mar 04, 1956   DOA: 12/19/2020  Referring Physician: Merton Border, MD  HPI: Isaac [name] is a 65 y.o. male seen for follow up of Acute on Chronic Respiratory Failure.  Doing well with capping ready for decannulation today  Medications: Reviewed on Rounds  Physical Exam:  Vitals: Temperature is 96.4 pulse 84 respiratory 18 blood pressure is 148/86 saturations 96%  Ventilator Settings capping on 1 L  . General: Comfortable at this time . Eyes: Grossly normal lids, irises & conjunctiva . ENT: grossly tongue is normal . Neck: no obvious mass . Cardiovascular: S1 S2 normal no gallop . Respiratory: Scattered rhonchi expansion is equal . Abdomen: soft . Skin: no rash seen on limited exam . Musculoskeletal: not rigid . Psychiatric:unable to assess . Neurologic: no seizure no involuntary movements         Lab Data:   Basic Metabolic Panel: Recent Labs  Lab 01/02/21 0347 01/03/21 0847 01/06/21 0402 01/07/21 1134  NA 139  --  138  --   K 3.3* 4.7 5.2* 5.5*  CL 93*  --  94*  --   CO2 36*  --  34*  --   GLUCOSE 128*  --  179*  --   BUN 38*  --  18  --   CREATININE 0.88  --  0.89  --   CALCIUM 9.4  --  8.8*  --   MG 2.2  --   --   --     ABG: No results for input(s): PHART, PCO2ART, PO2ART, HCO3, O2SAT in the last 168 hours.  Liver Function Tests: No results for input(s): AST, ALT, ALKPHOS, BILITOT, PROT, ALBUMIN in the last 168 hours. No results for input(s): LIPASE, AMYLASE in the last 168 hours. No results for input(s): AMMONIA in the last 168 hours.  CBC: Recent Labs  Lab 01/06/21 0402  WBC 209.4*  HGB 10.6*  HCT 36.0*  MCV 90.9  PLT 275    Cardiac Enzymes: No results for input(s): CKTOTAL, CKMB, CKMBINDEX, TROPONINI in the last 168 hours.  BNP (last 3 results) Recent  Labs    12/08/20 0100  BNP 146.1*    ProBNP (last 3 results) No results for input(s): PROBNP in the last 8760 hours.  Radiological Exams: No results found.  Assessment/Plan Active Problems:   Acute on chronic respiratory failure with hypoxia (HCC)   COPD, severe (HCC)   Left lower lobe pneumonia   Chronic lymphocytic leukemia in remission ([place])   1. Acute on chronic respiratory failure hypoxia plan is to proceed with decannulation patient has met the criteria for decannulation 2. Severe COPD no change continue with supportive care 3. Left lower lobe pneumonia treated improved 4. CLL in remission   I have personally seen and evaluated the patient, evaluated laboratory and imaging results, formulated the assessment and plan and placed orders. The Patient requires high complexity decision making with multiple systems involvement.  Rounds were done with the Respiratory Therapy Director and Staff therapists and discussed with nursing staff also.  Allyne Gee, MD [facility] Pulmonary Critical Care Medicine Sleep Medicine

## 2021-01-09 LAB — POTASSIUM: Potassium: 3.7 mmol/L (ref 3.5–5.1)

## 2021-01-16 DIAGNOSIS — I2781 Cor pulmonale (chronic): Secondary | ICD-10-CM | POA: Diagnosis not present

## 2021-01-16 DIAGNOSIS — R0602 Shortness of breath: Secondary | ICD-10-CM | POA: Diagnosis not present

## 2021-01-16 DIAGNOSIS — R1310 Dysphagia, unspecified: Secondary | ICD-10-CM | POA: Diagnosis not present

## 2021-01-16 DIAGNOSIS — Z7409 Other reduced mobility: Secondary | ICD-10-CM | POA: Diagnosis not present

## 2021-01-16 DIAGNOSIS — J80 Acute respiratory distress syndrome: Secondary | ICD-10-CM | POA: Diagnosis not present

## 2021-01-16 DIAGNOSIS — Z9981 Dependence on supplemental oxygen: Secondary | ICD-10-CM | POA: Diagnosis not present

## 2021-01-16 DIAGNOSIS — R140 Abdominal distension (gaseous): Secondary | ICD-10-CM | POA: Diagnosis not present

## 2021-01-16 DIAGNOSIS — C9110 Chronic lymphocytic leukemia of B-cell type not having achieved remission: Secondary | ICD-10-CM | POA: Diagnosis not present

## 2021-01-16 DIAGNOSIS — Z743 Need for continuous supervision: Secondary | ICD-10-CM | POA: Diagnosis not present

## 2021-01-16 DIAGNOSIS — R531 Weakness: Secondary | ICD-10-CM | POA: Diagnosis not present

## 2021-01-16 DIAGNOSIS — L89159 Pressure ulcer of sacral region, unspecified stage: Secondary | ICD-10-CM | POA: Diagnosis not present

## 2021-01-16 DIAGNOSIS — R739 Hyperglycemia, unspecified: Secondary | ICD-10-CM | POA: Diagnosis not present

## 2021-01-16 DIAGNOSIS — R279 Unspecified lack of coordination: Secondary | ICD-10-CM | POA: Diagnosis not present

## 2021-01-16 DIAGNOSIS — J449 Chronic obstructive pulmonary disease, unspecified: Secondary | ICD-10-CM | POA: Diagnosis not present

## 2021-01-16 DIAGNOSIS — E883 Tumor lysis syndrome: Secondary | ICD-10-CM | POA: Diagnosis not present

## 2021-01-16 DIAGNOSIS — J9601 Acute respiratory failure with hypoxia: Secondary | ICD-10-CM | POA: Diagnosis not present

## 2021-01-16 DIAGNOSIS — I10 Essential (primary) hypertension: Secondary | ICD-10-CM | POA: Diagnosis not present

## 2021-01-16 DIAGNOSIS — J9611 Chronic respiratory failure with hypoxia: Secondary | ICD-10-CM | POA: Diagnosis not present

## 2021-01-16 DIAGNOSIS — K5900 Constipation, unspecified: Secondary | ICD-10-CM | POA: Diagnosis not present

## 2021-01-16 DIAGNOSIS — G7281 Critical illness myopathy: Secondary | ICD-10-CM | POA: Diagnosis not present

## 2021-01-16 DIAGNOSIS — J9600 Acute respiratory failure, unspecified whether with hypoxia or hypercapnia: Secondary | ICD-10-CM | POA: Diagnosis not present

## 2021-01-16 DIAGNOSIS — R2689 Other abnormalities of gait and mobility: Secondary | ICD-10-CM | POA: Diagnosis not present

## 2021-01-29 DIAGNOSIS — T380X5S Adverse effect of glucocorticoids and synthetic analogues, sequela: Secondary | ICD-10-CM | POA: Diagnosis not present

## 2021-01-29 DIAGNOSIS — I2781 Cor pulmonale (chronic): Secondary | ICD-10-CM | POA: Diagnosis not present

## 2021-01-29 DIAGNOSIS — I10 Essential (primary) hypertension: Secondary | ICD-10-CM | POA: Diagnosis not present

## 2021-01-29 DIAGNOSIS — K5900 Constipation, unspecified: Secondary | ICD-10-CM | POA: Diagnosis not present

## 2021-01-29 DIAGNOSIS — G7281 Critical illness myopathy: Secondary | ICD-10-CM | POA: Diagnosis not present

## 2021-01-29 DIAGNOSIS — C9110 Chronic lymphocytic leukemia of B-cell type not having achieved remission: Secondary | ICD-10-CM | POA: Diagnosis not present

## 2021-01-29 DIAGNOSIS — K219 Gastro-esophageal reflux disease without esophagitis: Secondary | ICD-10-CM | POA: Diagnosis not present

## 2021-01-29 DIAGNOSIS — J189 Pneumonia, unspecified organism: Secondary | ICD-10-CM | POA: Diagnosis not present

## 2021-01-29 DIAGNOSIS — R739 Hyperglycemia, unspecified: Secondary | ICD-10-CM | POA: Diagnosis not present

## 2021-01-29 DIAGNOSIS — E871 Hypo-osmolality and hyponatremia: Secondary | ICD-10-CM | POA: Diagnosis not present

## 2021-01-29 DIAGNOSIS — J440 Chronic obstructive pulmonary disease with acute lower respiratory infection: Secondary | ICD-10-CM | POA: Diagnosis not present

## 2021-01-29 DIAGNOSIS — J9621 Acute and chronic respiratory failure with hypoxia: Secondary | ICD-10-CM | POA: Diagnosis not present

## 2021-01-29 DIAGNOSIS — J441 Chronic obstructive pulmonary disease with (acute) exacerbation: Secondary | ICD-10-CM | POA: Diagnosis not present

## 2021-02-01 DIAGNOSIS — K5900 Constipation, unspecified: Secondary | ICD-10-CM | POA: Diagnosis not present

## 2021-02-01 DIAGNOSIS — E871 Hypo-osmolality and hyponatremia: Secondary | ICD-10-CM | POA: Diagnosis not present

## 2021-02-01 DIAGNOSIS — I10 Essential (primary) hypertension: Secondary | ICD-10-CM | POA: Diagnosis not present

## 2021-02-01 DIAGNOSIS — J189 Pneumonia, unspecified organism: Secondary | ICD-10-CM | POA: Diagnosis not present

## 2021-02-01 DIAGNOSIS — C9110 Chronic lymphocytic leukemia of B-cell type not having achieved remission: Secondary | ICD-10-CM | POA: Diagnosis not present

## 2021-02-01 DIAGNOSIS — J440 Chronic obstructive pulmonary disease with acute lower respiratory infection: Secondary | ICD-10-CM | POA: Diagnosis not present

## 2021-02-01 DIAGNOSIS — J441 Chronic obstructive pulmonary disease with (acute) exacerbation: Secondary | ICD-10-CM | POA: Diagnosis not present

## 2021-02-01 DIAGNOSIS — I2781 Cor pulmonale (chronic): Secondary | ICD-10-CM | POA: Diagnosis not present

## 2021-02-01 DIAGNOSIS — G7281 Critical illness myopathy: Secondary | ICD-10-CM | POA: Diagnosis not present

## 2021-02-01 DIAGNOSIS — R739 Hyperglycemia, unspecified: Secondary | ICD-10-CM | POA: Diagnosis not present

## 2021-02-01 DIAGNOSIS — J9621 Acute and chronic respiratory failure with hypoxia: Secondary | ICD-10-CM | POA: Diagnosis not present

## 2021-02-01 DIAGNOSIS — K219 Gastro-esophageal reflux disease without esophagitis: Secondary | ICD-10-CM | POA: Diagnosis not present

## 2021-02-01 DIAGNOSIS — T380X5S Adverse effect of glucocorticoids and synthetic analogues, sequela: Secondary | ICD-10-CM | POA: Diagnosis not present

## 2021-02-06 DIAGNOSIS — J440 Chronic obstructive pulmonary disease with acute lower respiratory infection: Secondary | ICD-10-CM | POA: Diagnosis not present

## 2021-02-06 DIAGNOSIS — E871 Hypo-osmolality and hyponatremia: Secondary | ICD-10-CM | POA: Diagnosis not present

## 2021-02-06 DIAGNOSIS — K219 Gastro-esophageal reflux disease without esophagitis: Secondary | ICD-10-CM | POA: Diagnosis not present

## 2021-02-06 DIAGNOSIS — C9110 Chronic lymphocytic leukemia of B-cell type not having achieved remission: Secondary | ICD-10-CM | POA: Diagnosis not present

## 2021-02-06 DIAGNOSIS — K5900 Constipation, unspecified: Secondary | ICD-10-CM | POA: Diagnosis not present

## 2021-02-06 DIAGNOSIS — J189 Pneumonia, unspecified organism: Secondary | ICD-10-CM | POA: Diagnosis not present

## 2021-02-06 DIAGNOSIS — I2781 Cor pulmonale (chronic): Secondary | ICD-10-CM | POA: Diagnosis not present

## 2021-02-06 DIAGNOSIS — J9621 Acute and chronic respiratory failure with hypoxia: Secondary | ICD-10-CM | POA: Diagnosis not present

## 2021-02-06 DIAGNOSIS — T380X5S Adverse effect of glucocorticoids and synthetic analogues, sequela: Secondary | ICD-10-CM | POA: Diagnosis not present

## 2021-02-06 DIAGNOSIS — R739 Hyperglycemia, unspecified: Secondary | ICD-10-CM | POA: Diagnosis not present

## 2021-02-06 DIAGNOSIS — I10 Essential (primary) hypertension: Secondary | ICD-10-CM | POA: Diagnosis not present

## 2021-02-06 DIAGNOSIS — G7281 Critical illness myopathy: Secondary | ICD-10-CM | POA: Diagnosis not present

## 2021-02-06 DIAGNOSIS — J441 Chronic obstructive pulmonary disease with (acute) exacerbation: Secondary | ICD-10-CM | POA: Diagnosis not present

## 2021-02-08 DIAGNOSIS — I10 Essential (primary) hypertension: Secondary | ICD-10-CM | POA: Diagnosis not present

## 2021-02-08 DIAGNOSIS — C9110 Chronic lymphocytic leukemia of B-cell type not having achieved remission: Secondary | ICD-10-CM | POA: Diagnosis not present

## 2021-02-08 DIAGNOSIS — J9621 Acute and chronic respiratory failure with hypoxia: Secondary | ICD-10-CM | POA: Diagnosis not present

## 2021-02-08 DIAGNOSIS — E871 Hypo-osmolality and hyponatremia: Secondary | ICD-10-CM | POA: Diagnosis not present

## 2021-02-08 DIAGNOSIS — J189 Pneumonia, unspecified organism: Secondary | ICD-10-CM | POA: Diagnosis not present

## 2021-02-08 DIAGNOSIS — K219 Gastro-esophageal reflux disease without esophagitis: Secondary | ICD-10-CM | POA: Diagnosis not present

## 2021-02-08 DIAGNOSIS — G7281 Critical illness myopathy: Secondary | ICD-10-CM | POA: Diagnosis not present

## 2021-02-08 DIAGNOSIS — R739 Hyperglycemia, unspecified: Secondary | ICD-10-CM | POA: Diagnosis not present

## 2021-02-08 DIAGNOSIS — I2781 Cor pulmonale (chronic): Secondary | ICD-10-CM | POA: Diagnosis not present

## 2021-02-08 DIAGNOSIS — T380X5S Adverse effect of glucocorticoids and synthetic analogues, sequela: Secondary | ICD-10-CM | POA: Diagnosis not present

## 2021-02-08 DIAGNOSIS — J441 Chronic obstructive pulmonary disease with (acute) exacerbation: Secondary | ICD-10-CM | POA: Diagnosis not present

## 2021-02-08 DIAGNOSIS — J440 Chronic obstructive pulmonary disease with acute lower respiratory infection: Secondary | ICD-10-CM | POA: Diagnosis not present

## 2021-02-08 DIAGNOSIS — K5900 Constipation, unspecified: Secondary | ICD-10-CM | POA: Diagnosis not present

## 2021-02-10 ENCOUNTER — Telehealth: Payer: Self-pay | Admitting: Internal Medicine

## 2021-02-10 DIAGNOSIS — I2781 Cor pulmonale (chronic): Secondary | ICD-10-CM | POA: Diagnosis not present

## 2021-02-10 DIAGNOSIS — E871 Hypo-osmolality and hyponatremia: Secondary | ICD-10-CM | POA: Diagnosis not present

## 2021-02-10 DIAGNOSIS — K5900 Constipation, unspecified: Secondary | ICD-10-CM | POA: Diagnosis not present

## 2021-02-10 DIAGNOSIS — R739 Hyperglycemia, unspecified: Secondary | ICD-10-CM | POA: Diagnosis not present

## 2021-02-10 DIAGNOSIS — J441 Chronic obstructive pulmonary disease with (acute) exacerbation: Secondary | ICD-10-CM | POA: Diagnosis not present

## 2021-02-10 DIAGNOSIS — C9110 Chronic lymphocytic leukemia of B-cell type not having achieved remission: Secondary | ICD-10-CM | POA: Diagnosis not present

## 2021-02-10 DIAGNOSIS — J189 Pneumonia, unspecified organism: Secondary | ICD-10-CM | POA: Diagnosis not present

## 2021-02-10 DIAGNOSIS — T380X5S Adverse effect of glucocorticoids and synthetic analogues, sequela: Secondary | ICD-10-CM | POA: Diagnosis not present

## 2021-02-10 DIAGNOSIS — K219 Gastro-esophageal reflux disease without esophagitis: Secondary | ICD-10-CM | POA: Diagnosis not present

## 2021-02-10 DIAGNOSIS — J440 Chronic obstructive pulmonary disease with acute lower respiratory infection: Secondary | ICD-10-CM | POA: Diagnosis not present

## 2021-02-10 DIAGNOSIS — G7281 Critical illness myopathy: Secondary | ICD-10-CM | POA: Diagnosis not present

## 2021-02-10 DIAGNOSIS — J9621 Acute and chronic respiratory failure with hypoxia: Secondary | ICD-10-CM | POA: Diagnosis not present

## 2021-02-10 DIAGNOSIS — I10 Essential (primary) hypertension: Secondary | ICD-10-CM | POA: Diagnosis not present

## 2021-02-10 NOTE — Telephone Encounter (Signed)
I called and spoke with pt and his wife--they felt that he did better on the airduo.  She stated that he is finally home from the hospital, for about 2 weeks and is having lots of therapy at home.  He did the airduo while he was in the hospital and this worked very well for him.  MR please advise on the change in med.   Thanks

## 2021-02-10 NOTE — Telephone Encounter (Signed)
pt wife calling because pt is currently taking albuterol & ipratropium,  pt was wondering if he can go back to taking the airduo... was unable to view meds in chart, pt wife did confirm that these med were prescription Dr. Chase Caller.  pt also requested televisit  please advise [phone]

## 2021-02-13 DIAGNOSIS — K5900 Constipation, unspecified: Secondary | ICD-10-CM | POA: Diagnosis not present

## 2021-02-13 DIAGNOSIS — K219 Gastro-esophageal reflux disease without esophagitis: Secondary | ICD-10-CM | POA: Diagnosis not present

## 2021-02-13 DIAGNOSIS — T380X5S Adverse effect of glucocorticoids and synthetic analogues, sequela: Secondary | ICD-10-CM | POA: Diagnosis not present

## 2021-02-13 DIAGNOSIS — J441 Chronic obstructive pulmonary disease with (acute) exacerbation: Secondary | ICD-10-CM | POA: Diagnosis not present

## 2021-02-13 DIAGNOSIS — R739 Hyperglycemia, unspecified: Secondary | ICD-10-CM | POA: Diagnosis not present

## 2021-02-13 DIAGNOSIS — G7281 Critical illness myopathy: Secondary | ICD-10-CM | POA: Diagnosis not present

## 2021-02-13 DIAGNOSIS — E871 Hypo-osmolality and hyponatremia: Secondary | ICD-10-CM | POA: Diagnosis not present

## 2021-02-13 DIAGNOSIS — J189 Pneumonia, unspecified organism: Secondary | ICD-10-CM | POA: Diagnosis not present

## 2021-02-13 DIAGNOSIS — J440 Chronic obstructive pulmonary disease with acute lower respiratory infection: Secondary | ICD-10-CM | POA: Diagnosis not present

## 2021-02-13 DIAGNOSIS — I10 Essential (primary) hypertension: Secondary | ICD-10-CM | POA: Diagnosis not present

## 2021-02-13 DIAGNOSIS — J9621 Acute and chronic respiratory failure with hypoxia: Secondary | ICD-10-CM | POA: Diagnosis not present

## 2021-02-13 DIAGNOSIS — C9110 Chronic lymphocytic leukemia of B-cell type not having achieved remission: Secondary | ICD-10-CM | POA: Diagnosis not present

## 2021-02-13 DIAGNOSIS — I2781 Cor pulmonale (chronic): Secondary | ICD-10-CM | POA: Diagnosis not present

## 2021-02-14 DIAGNOSIS — J449 Chronic obstructive pulmonary disease, unspecified: Secondary | ICD-10-CM | POA: Diagnosis not present

## 2021-02-14 DIAGNOSIS — M545 Low back pain, unspecified: Secondary | ICD-10-CM | POA: Diagnosis not present

## 2021-02-14 DIAGNOSIS — R599 Enlarged lymph nodes, unspecified: Secondary | ICD-10-CM | POA: Diagnosis not present

## 2021-02-14 DIAGNOSIS — D72829 Elevated white blood cell count, unspecified: Secondary | ICD-10-CM | POA: Diagnosis not present

## 2021-02-14 NOTE — Telephone Encounter (Signed)
Called and spoke with patient's wife to let her know recs of Dr. Chase Caller. She expressed understanding. Nothing further needed at this time.

## 2021-02-14 NOTE — Telephone Encounter (Signed)
Called and spoke with the patient's wife and she states its not for a airduo inhaler, it is for the duoneb medication for the nebulizer.    Sending to Dr. Chase Caller for further recommendations.

## 2021-02-14 NOTE — Telephone Encounter (Signed)
Ok for airduo respi click at medium dose of 161mcg/14mcg - 1 inhalation bid

## 2021-02-14 NOTE — Telephone Encounter (Signed)
Ok for [employer] - One 3 mL vial (albuterol-ipratropium bromide 2.5 mg-0.5 mg) four times a day via nebulization

## 2021-02-15 DIAGNOSIS — R739 Hyperglycemia, unspecified: Secondary | ICD-10-CM | POA: Diagnosis not present

## 2021-02-15 DIAGNOSIS — J441 Chronic obstructive pulmonary disease with (acute) exacerbation: Secondary | ICD-10-CM | POA: Diagnosis not present

## 2021-02-15 DIAGNOSIS — J189 Pneumonia, unspecified organism: Secondary | ICD-10-CM | POA: Diagnosis not present

## 2021-02-15 DIAGNOSIS — J440 Chronic obstructive pulmonary disease with acute lower respiratory infection: Secondary | ICD-10-CM | POA: Diagnosis not present

## 2021-02-15 DIAGNOSIS — J9621 Acute and chronic respiratory failure with hypoxia: Secondary | ICD-10-CM | POA: Diagnosis not present

## 2021-02-15 DIAGNOSIS — T380X5S Adverse effect of glucocorticoids and synthetic analogues, sequela: Secondary | ICD-10-CM | POA: Diagnosis not present

## 2021-02-15 DIAGNOSIS — I10 Essential (primary) hypertension: Secondary | ICD-10-CM | POA: Diagnosis not present

## 2021-02-15 DIAGNOSIS — I2781 Cor pulmonale (chronic): Secondary | ICD-10-CM | POA: Diagnosis not present

## 2021-02-15 DIAGNOSIS — K5900 Constipation, unspecified: Secondary | ICD-10-CM | POA: Diagnosis not present

## 2021-02-15 DIAGNOSIS — G7281 Critical illness myopathy: Secondary | ICD-10-CM | POA: Diagnosis not present

## 2021-02-15 DIAGNOSIS — C9110 Chronic lymphocytic leukemia of B-cell type not having achieved remission: Secondary | ICD-10-CM | POA: Diagnosis not present

## 2021-02-15 DIAGNOSIS — E871 Hypo-osmolality and hyponatremia: Secondary | ICD-10-CM | POA: Diagnosis not present

## 2021-02-15 DIAGNOSIS — K219 Gastro-esophageal reflux disease without esophagitis: Secondary | ICD-10-CM | POA: Diagnosis not present

## 2021-02-17 ENCOUNTER — Telehealth: Payer: Self-pay | Admitting: Internal Medicine

## 2021-02-17 DIAGNOSIS — I10 Essential (primary) hypertension: Secondary | ICD-10-CM | POA: Diagnosis not present

## 2021-02-17 DIAGNOSIS — R739 Hyperglycemia, unspecified: Secondary | ICD-10-CM | POA: Diagnosis not present

## 2021-02-17 DIAGNOSIS — J441 Chronic obstructive pulmonary disease with (acute) exacerbation: Secondary | ICD-10-CM | POA: Diagnosis not present

## 2021-02-17 DIAGNOSIS — J440 Chronic obstructive pulmonary disease with acute lower respiratory infection: Secondary | ICD-10-CM | POA: Diagnosis not present

## 2021-02-17 DIAGNOSIS — C9110 Chronic lymphocytic leukemia of B-cell type not having achieved remission: Secondary | ICD-10-CM | POA: Diagnosis not present

## 2021-02-17 DIAGNOSIS — E871 Hypo-osmolality and hyponatremia: Secondary | ICD-10-CM | POA: Diagnosis not present

## 2021-02-17 DIAGNOSIS — J189 Pneumonia, unspecified organism: Secondary | ICD-10-CM | POA: Diagnosis not present

## 2021-02-17 DIAGNOSIS — I2781 Cor pulmonale (chronic): Secondary | ICD-10-CM | POA: Diagnosis not present

## 2021-02-17 DIAGNOSIS — G7281 Critical illness myopathy: Secondary | ICD-10-CM | POA: Diagnosis not present

## 2021-02-17 DIAGNOSIS — J9621 Acute and chronic respiratory failure with hypoxia: Secondary | ICD-10-CM | POA: Diagnosis not present

## 2021-02-17 DIAGNOSIS — K219 Gastro-esophageal reflux disease without esophagitis: Secondary | ICD-10-CM | POA: Diagnosis not present

## 2021-02-17 DIAGNOSIS — K5900 Constipation, unspecified: Secondary | ICD-10-CM | POA: Diagnosis not present

## 2021-02-17 DIAGNOSIS — T380X5S Adverse effect of glucocorticoids and synthetic analogues, sequela: Secondary | ICD-10-CM | POA: Diagnosis not present

## 2021-02-17 MED ORDER — IPRATROPIUM-ALBUTEROL 0.5-2.5 (3) MG/3ML IN SOLN: 3.0000 mL | Freq: Four times a day (QID) | RESPIRATORY_TRACT | 3 refills | Status: DC | PRN

## 2021-02-17 NOTE — Telephone Encounter (Signed)
Called and spoke with pts wife and she stated that the pharmacy still did not have the duoneb for the pt.  I have sent this to the pharmacy for the pt

## 2021-02-20 DIAGNOSIS — J441 Chronic obstructive pulmonary disease with (acute) exacerbation: Secondary | ICD-10-CM | POA: Diagnosis not present

## 2021-02-20 DIAGNOSIS — J189 Pneumonia, unspecified organism: Secondary | ICD-10-CM | POA: Diagnosis not present

## 2021-02-20 DIAGNOSIS — K5900 Constipation, unspecified: Secondary | ICD-10-CM | POA: Diagnosis not present

## 2021-02-20 DIAGNOSIS — G7281 Critical illness myopathy: Secondary | ICD-10-CM | POA: Diagnosis not present

## 2021-02-20 DIAGNOSIS — T380X5S Adverse effect of glucocorticoids and synthetic analogues, sequela: Secondary | ICD-10-CM | POA: Diagnosis not present

## 2021-02-20 DIAGNOSIS — I10 Essential (primary) hypertension: Secondary | ICD-10-CM | POA: Diagnosis not present

## 2021-02-20 DIAGNOSIS — E871 Hypo-osmolality and hyponatremia: Secondary | ICD-10-CM | POA: Diagnosis not present

## 2021-02-20 DIAGNOSIS — C9110 Chronic lymphocytic leukemia of B-cell type not having achieved remission: Secondary | ICD-10-CM | POA: Diagnosis not present

## 2021-02-20 DIAGNOSIS — J440 Chronic obstructive pulmonary disease with acute lower respiratory infection: Secondary | ICD-10-CM | POA: Diagnosis not present

## 2021-02-20 DIAGNOSIS — K219 Gastro-esophageal reflux disease without esophagitis: Secondary | ICD-10-CM | POA: Diagnosis not present

## 2021-02-20 DIAGNOSIS — R739 Hyperglycemia, unspecified: Secondary | ICD-10-CM | POA: Diagnosis not present

## 2021-02-20 DIAGNOSIS — I2781 Cor pulmonale (chronic): Secondary | ICD-10-CM | POA: Diagnosis not present

## 2021-02-20 DIAGNOSIS — J9621 Acute and chronic respiratory failure with hypoxia: Secondary | ICD-10-CM | POA: Diagnosis not present

## 2021-02-23 DIAGNOSIS — I2781 Cor pulmonale (chronic): Secondary | ICD-10-CM | POA: Diagnosis not present

## 2021-02-23 DIAGNOSIS — J440 Chronic obstructive pulmonary disease with acute lower respiratory infection: Secondary | ICD-10-CM | POA: Diagnosis not present

## 2021-02-23 DIAGNOSIS — T380X5S Adverse effect of glucocorticoids and synthetic analogues, sequela: Secondary | ICD-10-CM | POA: Diagnosis not present

## 2021-02-23 DIAGNOSIS — J9621 Acute and chronic respiratory failure with hypoxia: Secondary | ICD-10-CM | POA: Diagnosis not present

## 2021-02-23 DIAGNOSIS — I10 Essential (primary) hypertension: Secondary | ICD-10-CM | POA: Diagnosis not present

## 2021-02-23 DIAGNOSIS — J189 Pneumonia, unspecified organism: Secondary | ICD-10-CM | POA: Diagnosis not present

## 2021-02-23 DIAGNOSIS — K219 Gastro-esophageal reflux disease without esophagitis: Secondary | ICD-10-CM | POA: Diagnosis not present

## 2021-02-23 DIAGNOSIS — E871 Hypo-osmolality and hyponatremia: Secondary | ICD-10-CM | POA: Diagnosis not present

## 2021-02-23 DIAGNOSIS — C9110 Chronic lymphocytic leukemia of B-cell type not having achieved remission: Secondary | ICD-10-CM | POA: Diagnosis not present

## 2021-02-23 DIAGNOSIS — K5900 Constipation, unspecified: Secondary | ICD-10-CM | POA: Diagnosis not present

## 2021-02-23 DIAGNOSIS — R739 Hyperglycemia, unspecified: Secondary | ICD-10-CM | POA: Diagnosis not present

## 2021-02-23 DIAGNOSIS — J441 Chronic obstructive pulmonary disease with (acute) exacerbation: Secondary | ICD-10-CM | POA: Diagnosis not present

## 2021-02-23 DIAGNOSIS — G7281 Critical illness myopathy: Secondary | ICD-10-CM | POA: Diagnosis not present

## 2021-02-24 DIAGNOSIS — J189 Pneumonia, unspecified organism: Secondary | ICD-10-CM | POA: Diagnosis not present

## 2021-02-24 DIAGNOSIS — J440 Chronic obstructive pulmonary disease with acute lower respiratory infection: Secondary | ICD-10-CM | POA: Diagnosis not present

## 2021-02-24 DIAGNOSIS — J9621 Acute and chronic respiratory failure with hypoxia: Secondary | ICD-10-CM | POA: Diagnosis not present

## 2021-02-24 DIAGNOSIS — G7281 Critical illness myopathy: Secondary | ICD-10-CM | POA: Diagnosis not present

## 2021-02-28 DIAGNOSIS — E871 Hypo-osmolality and hyponatremia: Secondary | ICD-10-CM | POA: Diagnosis not present

## 2021-02-28 DIAGNOSIS — J440 Chronic obstructive pulmonary disease with acute lower respiratory infection: Secondary | ICD-10-CM | POA: Diagnosis not present

## 2021-02-28 DIAGNOSIS — K5900 Constipation, unspecified: Secondary | ICD-10-CM | POA: Diagnosis not present

## 2021-02-28 DIAGNOSIS — G7281 Critical illness myopathy: Secondary | ICD-10-CM | POA: Diagnosis not present

## 2021-02-28 DIAGNOSIS — T380X5S Adverse effect of glucocorticoids and synthetic analogues, sequela: Secondary | ICD-10-CM | POA: Diagnosis not present

## 2021-02-28 DIAGNOSIS — J441 Chronic obstructive pulmonary disease with (acute) exacerbation: Secondary | ICD-10-CM | POA: Diagnosis not present

## 2021-02-28 DIAGNOSIS — C9110 Chronic lymphocytic leukemia of B-cell type not having achieved remission: Secondary | ICD-10-CM | POA: Diagnosis not present

## 2021-02-28 DIAGNOSIS — J189 Pneumonia, unspecified organism: Secondary | ICD-10-CM | POA: Diagnosis not present

## 2021-02-28 DIAGNOSIS — J9621 Acute and chronic respiratory failure with hypoxia: Secondary | ICD-10-CM | POA: Diagnosis not present

## 2021-02-28 DIAGNOSIS — K219 Gastro-esophageal reflux disease without esophagitis: Secondary | ICD-10-CM | POA: Diagnosis not present

## 2021-02-28 DIAGNOSIS — I10 Essential (primary) hypertension: Secondary | ICD-10-CM | POA: Diagnosis not present

## 2021-02-28 DIAGNOSIS — R739 Hyperglycemia, unspecified: Secondary | ICD-10-CM | POA: Diagnosis not present

## 2021-02-28 DIAGNOSIS — I2781 Cor pulmonale (chronic): Secondary | ICD-10-CM | POA: Diagnosis not present

## 2021-03-02 DIAGNOSIS — K5900 Constipation, unspecified: Secondary | ICD-10-CM | POA: Diagnosis not present

## 2021-03-02 DIAGNOSIS — J440 Chronic obstructive pulmonary disease with acute lower respiratory infection: Secondary | ICD-10-CM | POA: Diagnosis not present

## 2021-03-02 DIAGNOSIS — J441 Chronic obstructive pulmonary disease with (acute) exacerbation: Secondary | ICD-10-CM | POA: Diagnosis not present

## 2021-03-02 DIAGNOSIS — C9110 Chronic lymphocytic leukemia of B-cell type not having achieved remission: Secondary | ICD-10-CM | POA: Diagnosis not present

## 2021-03-02 DIAGNOSIS — J9621 Acute and chronic respiratory failure with hypoxia: Secondary | ICD-10-CM | POA: Diagnosis not present

## 2021-03-02 DIAGNOSIS — G7281 Critical illness myopathy: Secondary | ICD-10-CM | POA: Diagnosis not present

## 2021-03-02 DIAGNOSIS — K219 Gastro-esophageal reflux disease without esophagitis: Secondary | ICD-10-CM | POA: Diagnosis not present

## 2021-03-02 DIAGNOSIS — I2781 Cor pulmonale (chronic): Secondary | ICD-10-CM | POA: Diagnosis not present

## 2021-03-02 DIAGNOSIS — T380X5S Adverse effect of glucocorticoids and synthetic analogues, sequela: Secondary | ICD-10-CM | POA: Diagnosis not present

## 2021-03-02 DIAGNOSIS — R739 Hyperglycemia, unspecified: Secondary | ICD-10-CM | POA: Diagnosis not present

## 2021-03-02 DIAGNOSIS — J189 Pneumonia, unspecified organism: Secondary | ICD-10-CM | POA: Diagnosis not present

## 2021-03-02 DIAGNOSIS — E871 Hypo-osmolality and hyponatremia: Secondary | ICD-10-CM | POA: Diagnosis not present

## 2021-03-02 DIAGNOSIS — I10 Essential (primary) hypertension: Secondary | ICD-10-CM | POA: Diagnosis not present

## 2021-03-03 DIAGNOSIS — T380X5S Adverse effect of glucocorticoids and synthetic analogues, sequela: Secondary | ICD-10-CM | POA: Diagnosis not present

## 2021-03-03 DIAGNOSIS — C9110 Chronic lymphocytic leukemia of B-cell type not having achieved remission: Secondary | ICD-10-CM | POA: Diagnosis not present

## 2021-03-03 DIAGNOSIS — K219 Gastro-esophageal reflux disease without esophagitis: Secondary | ICD-10-CM | POA: Diagnosis not present

## 2021-03-03 DIAGNOSIS — R739 Hyperglycemia, unspecified: Secondary | ICD-10-CM | POA: Diagnosis not present

## 2021-03-03 DIAGNOSIS — I10 Essential (primary) hypertension: Secondary | ICD-10-CM | POA: Diagnosis not present

## 2021-03-03 DIAGNOSIS — J189 Pneumonia, unspecified organism: Secondary | ICD-10-CM | POA: Diagnosis not present

## 2021-03-03 DIAGNOSIS — G7281 Critical illness myopathy: Secondary | ICD-10-CM | POA: Diagnosis not present

## 2021-03-03 DIAGNOSIS — J441 Chronic obstructive pulmonary disease with (acute) exacerbation: Secondary | ICD-10-CM | POA: Diagnosis not present

## 2021-03-03 DIAGNOSIS — K5900 Constipation, unspecified: Secondary | ICD-10-CM | POA: Diagnosis not present

## 2021-03-03 DIAGNOSIS — I2781 Cor pulmonale (chronic): Secondary | ICD-10-CM | POA: Diagnosis not present

## 2021-03-03 DIAGNOSIS — E871 Hypo-osmolality and hyponatremia: Secondary | ICD-10-CM | POA: Diagnosis not present

## 2021-03-03 DIAGNOSIS — J9621 Acute and chronic respiratory failure with hypoxia: Secondary | ICD-10-CM | POA: Diagnosis not present

## 2021-03-03 DIAGNOSIS — J440 Chronic obstructive pulmonary disease with acute lower respiratory infection: Secondary | ICD-10-CM | POA: Diagnosis not present

## 2021-03-04 ENCOUNTER — Other Ambulatory Visit: Payer: Self-pay

## 2021-03-04 ENCOUNTER — Emergency Department (HOSPITAL_COMMUNITY): Payer: BC Managed Care – PPO

## 2021-03-04 ENCOUNTER — Encounter (HOSPITAL_COMMUNITY): Payer: Self-pay | Admitting: Emergency Medicine

## 2021-03-04 ENCOUNTER — Inpatient Hospital Stay (HOSPITAL_COMMUNITY)
Admission: EM | Admit: 2021-03-04 | Discharge: 2021-03-08 | DRG: 193 | Disposition: A | Discharge status: Home Health | Payer: BC Managed Care – PPO | Attending: Family Medicine | Admitting: Family Medicine

## 2021-03-04 DIAGNOSIS — C9111 Chronic lymphocytic leukemia of B-cell type in remission: Secondary | ICD-10-CM | POA: Diagnosis not present

## 2021-03-04 DIAGNOSIS — J449 Chronic obstructive pulmonary disease, unspecified: Secondary | ICD-10-CM | POA: Diagnosis not present

## 2021-03-04 DIAGNOSIS — R0902 Hypoxemia: Secondary | ICD-10-CM | POA: Diagnosis not present

## 2021-03-04 DIAGNOSIS — J9622 Acute and chronic respiratory failure with hypercapnia: Secondary | ICD-10-CM | POA: Diagnosis not present

## 2021-03-04 DIAGNOSIS — Z885 Allergy status to narcotic agent status: Secondary | ICD-10-CM

## 2021-03-04 DIAGNOSIS — Z9981 Dependence on supplemental oxygen: Secondary | ICD-10-CM | POA: Diagnosis not present

## 2021-03-04 DIAGNOSIS — J441 Chronic obstructive pulmonary disease with (acute) exacerbation: Secondary | ICD-10-CM | POA: Diagnosis not present

## 2021-03-04 DIAGNOSIS — J9601 Acute respiratory failure with hypoxia: Secondary | ICD-10-CM | POA: Diagnosis not present

## 2021-03-04 DIAGNOSIS — Z743 Need for continuous supervision: Secondary | ICD-10-CM | POA: Diagnosis not present

## 2021-03-04 DIAGNOSIS — I110 Hypertensive heart disease with heart failure: Secondary | ICD-10-CM | POA: Diagnosis present

## 2021-03-04 DIAGNOSIS — R918 Other nonspecific abnormal finding of lung field: Secondary | ICD-10-CM | POA: Diagnosis not present

## 2021-03-04 DIAGNOSIS — E875 Hyperkalemia: Secondary | ICD-10-CM | POA: Diagnosis not present

## 2021-03-04 DIAGNOSIS — Z20822 Contact with and (suspected) exposure to covid-19: Secondary | ICD-10-CM | POA: Diagnosis present

## 2021-03-04 DIAGNOSIS — J9620 Acute and chronic respiratory failure, unspecified whether with hypoxia or hypercapnia: Secondary | ICD-10-CM | POA: Insufficient documentation

## 2021-03-04 DIAGNOSIS — I5032 Chronic diastolic (congestive) heart failure: Secondary | ICD-10-CM | POA: Diagnosis present

## 2021-03-04 DIAGNOSIS — J9621 Acute and chronic respiratory failure with hypoxia: Secondary | ICD-10-CM | POA: Diagnosis present

## 2021-03-04 DIAGNOSIS — Z79899 Other long term (current) drug therapy: Secondary | ICD-10-CM

## 2021-03-04 DIAGNOSIS — K219 Gastro-esophageal reflux disease without esophagitis: Secondary | ICD-10-CM | POA: Diagnosis present

## 2021-03-04 DIAGNOSIS — R6889 Other general symptoms and signs: Secondary | ICD-10-CM | POA: Diagnosis not present

## 2021-03-04 DIAGNOSIS — Z794 Long term (current) use of insulin: Secondary | ICD-10-CM | POA: Diagnosis not present

## 2021-03-04 DIAGNOSIS — G4733 Obstructive sleep apnea (adult) (pediatric): Secondary | ICD-10-CM | POA: Diagnosis present

## 2021-03-04 DIAGNOSIS — J189 Pneumonia, unspecified organism: Principal | ICD-10-CM | POA: Diagnosis present

## 2021-03-04 DIAGNOSIS — I499 Cardiac arrhythmia, unspecified: Secondary | ICD-10-CM | POA: Diagnosis not present

## 2021-03-04 DIAGNOSIS — Z87891 Personal history of nicotine dependence: Secondary | ICD-10-CM | POA: Diagnosis not present

## 2021-03-04 DIAGNOSIS — I4510 Unspecified right bundle-branch block: Secondary | ICD-10-CM | POA: Diagnosis not present

## 2021-03-04 DIAGNOSIS — Z888 Allergy status to other drugs, medicaments and biological substances status: Secondary | ICD-10-CM | POA: Diagnosis not present

## 2021-03-04 DIAGNOSIS — I517 Cardiomegaly: Secondary | ICD-10-CM | POA: Diagnosis not present

## 2021-03-04 DIAGNOSIS — Z806 Family history of leukemia: Secondary | ICD-10-CM

## 2021-03-04 DIAGNOSIS — J440 Chronic obstructive pulmonary disease with acute lower respiratory infection: Secondary | ICD-10-CM | POA: Diagnosis not present

## 2021-03-04 DIAGNOSIS — R0602 Shortness of breath: Secondary | ICD-10-CM | POA: Diagnosis not present

## 2021-03-04 DIAGNOSIS — E785 Hyperlipidemia, unspecified: Secondary | ICD-10-CM | POA: Diagnosis present

## 2021-03-04 DIAGNOSIS — Z6830 Body mass index (BMI) 30.0-30.9, adult: Secondary | ICD-10-CM

## 2021-03-04 DIAGNOSIS — Z823 Family history of stroke: Secondary | ICD-10-CM | POA: Diagnosis not present

## 2021-03-04 DIAGNOSIS — Z8249 Family history of ischemic heart disease and other diseases of the circulatory system: Secondary | ICD-10-CM

## 2021-03-04 DIAGNOSIS — I10 Essential (primary) hypertension: Secondary | ICD-10-CM | POA: Diagnosis present

## 2021-03-04 DIAGNOSIS — R000 Tachycardia, unspecified: Secondary | ICD-10-CM | POA: Diagnosis not present

## 2021-03-04 DIAGNOSIS — Z7951 Long term (current) use of inhaled steroids: Secondary | ICD-10-CM | POA: Diagnosis not present

## 2021-03-04 DIAGNOSIS — F419 Anxiety disorder, unspecified: Secondary | ICD-10-CM | POA: Diagnosis not present

## 2021-03-04 DIAGNOSIS — E669 Obesity, unspecified: Secondary | ICD-10-CM | POA: Diagnosis present

## 2021-03-04 DIAGNOSIS — R590 Localized enlarged lymph nodes: Secondary | ICD-10-CM | POA: Diagnosis not present

## 2021-03-04 DIAGNOSIS — Y95 Nosocomial condition: Secondary | ICD-10-CM | POA: Diagnosis present

## 2021-03-04 DIAGNOSIS — Z7952 Long term (current) use of systemic steroids: Secondary | ICD-10-CM

## 2021-03-04 DIAGNOSIS — J9602 Acute respiratory failure with hypercapnia: Secondary | ICD-10-CM

## 2021-03-04 LAB — GLUCOSE, CAPILLARY: Glucose-Capillary: 195 mg/dL — ABNORMAL HIGH (ref 70–99)

## 2021-03-04 LAB — COMPREHENSIVE METABOLIC PANEL
ALT: 12 U/L (ref 0–44)
AST: 28 U/L (ref 15–41)
Albumin: 3.7 g/dL (ref 3.5–5.0)
Alkaline Phosphatase: 128 U/L — ABNORMAL HIGH (ref 38–126)
Anion gap: 6 (ref 5–15)
BUN: 10 mg/dL (ref 8–23)
CO2: 31 mmol/L (ref 22–32)
Calcium: 9.1 mg/dL (ref 8.9–10.3)
Chloride: 102 mmol/L (ref 98–111)
Creatinine, Ser: 1.01 mg/dL (ref 0.61–1.24)
GFR, Estimated: >60 mL/min (ref >60)
Glucose, Bld: 116 mg/dL — ABNORMAL HIGH (ref 70–99)
Potassium: 5.3 mmol/L — ABNORMAL HIGH (ref 3.5–5.1)
Sodium: 139 mmol/L (ref 135–145)
Total Bilirubin: 1 mg/dL (ref 0.3–1.2)
Total Protein: 6.1 g/dL — ABNORMAL LOW (ref 6.5–8.1)

## 2021-03-04 LAB — I-STAT ARTERIAL BLOOD GAS, ED
Acid-Base Excess: 5 mmol/L — ABNORMAL HIGH (ref 0.0–2.0)
Bicarbonate: 34.3 mmol/L — ABNORMAL HIGH (ref 20.0–28.0)
Calcium, Ion: 1.24 mmol/L (ref 1.15–1.40)
HCT: 39 % (ref 39.0–52.0)
Hemoglobin: 13.3 g/dL (ref 13.0–17.0)
O2 Saturation: 99 %
Patient temperature: 98.2
Potassium: 4.2 mmol/L (ref 3.5–5.1)
Sodium: 140 mmol/L (ref 135–145)
TCO2: 36 mmol/L — ABNORMAL HIGH (ref 22–32)
pCO2 arterial: 69.8 mmHg (ref 32.0–48.0)
pH, Arterial: 7.298 — ABNORMAL LOW (ref 7.350–7.450)
pO2, Arterial: 168 mmHg — ABNORMAL HIGH (ref 83.0–108.0)

## 2021-03-04 LAB — LACTIC ACID, PLASMA
Lactic Acid, Venous: 2 mmol/L (ref 0.5–1.9)
Lactic Acid, Venous: 2.3 mmol/L (ref 0.5–1.9)

## 2021-03-04 LAB — CBC WITH DIFFERENTIAL/PLATELET
Abs Immature Granulocytes: 0.23 10*3/uL — ABNORMAL HIGH (ref 0.00–0.07)
Basophils Absolute: 0.7 10*3/uL — ABNORMAL HIGH (ref 0.0–0.1)
Basophils Relative: 1 %
Eosinophils Absolute: 0.1 10*3/uL (ref 0.0–0.5)
Eosinophils Relative: 0 %
HCT: 41.4 % (ref 39.0–52.0)
Hemoglobin: 12.2 g/dL — ABNORMAL LOW (ref 13.0–17.0)
Immature Granulocytes: 0 %
Lymphocytes Relative: 82 %
Lymphs Abs: 107 10*3/uL — ABNORMAL HIGH (ref 0.7–4.0)
MCH: 28 pg (ref 26.0–34.0)
MCHC: 29.5 g/dL — ABNORMAL LOW (ref 30.0–36.0)
MCV: 95 fL (ref 80.0–100.0)
Monocytes Absolute: 18.2 10*3/uL — ABNORMAL HIGH (ref 0.1–1.0)
Monocytes Relative: 14 %
Neutro Abs: 4.2 10*3/uL (ref 1.7–7.7)
Neutrophils Relative %: 3 %
Platelets: 235 10*3/uL (ref 150–400)
RBC: 4.36 MIL/uL (ref 4.22–5.81)
RDW: 15 % (ref 11.5–15.5)
WBC: 130.4 10*3/uL (ref 4.0–10.5)
nRBC: 0 % (ref 0.0–0.2)

## 2021-03-04 LAB — I-STAT CHEM 8, ED
BUN: 12 mg/dL (ref 8–23)
Calcium, Ion: 1.13 mmol/L — ABNORMAL LOW (ref 1.15–1.40)
Chloride: 100 mmol/L (ref 98–111)
Creatinine, Ser: 0.9 mg/dL (ref 0.61–1.24)
Glucose, Bld: 110 mg/dL — ABNORMAL HIGH (ref 70–99)
HCT: 41 % (ref 39.0–52.0)
Hemoglobin: 13.9 g/dL (ref 13.0–17.0)
Potassium: 4.8 mmol/L (ref 3.5–5.1)
Sodium: 138 mmol/L (ref 135–145)
TCO2: 30 mmol/L (ref 22–32)

## 2021-03-04 LAB — SARS CORONAVIRUS 2 (TAT 6-24 HRS): SARS Coronavirus 2: NEGATIVE

## 2021-03-04 LAB — TROPONIN I (HIGH SENSITIVITY)
Troponin I (High Sensitivity): 11 ng/L (ref <18)
Troponin I (High Sensitivity): 35 ng/L — ABNORMAL HIGH (ref <18)

## 2021-03-04 LAB — BRAIN NATRIURETIC PEPTIDE: B Natriuretic Peptide: 93.2 pg/mL (ref 0.0–100.0)

## 2021-03-04 MED ORDER — VANCOMYCIN HCL 2000 MG/400ML IV SOLN
2000.0000 mg | Freq: Once | INTRAVENOUS | Status: AC
  Administered 2021-03-04: 2000 mg via INTRAVENOUS
  Filled 2021-03-04: qty 400

## 2021-03-04 MED ORDER — SODIUM CHLORIDE 0.9 % IV SOLN
2.0000 g | INTRAVENOUS | Status: AC
  Administered 2021-03-04: 2 g via INTRAVENOUS
  Filled 2021-03-04: qty 2

## 2021-03-04 MED ORDER — IPRATROPIUM-ALBUTEROL 0.5-2.5 (3) MG/3ML IN SOLN: 3.0000 mL | Freq: Four times a day (QID) | RESPIRATORY_TRACT | Status: DC

## 2021-03-04 MED ORDER — METHYLPREDNISOLONE SODIUM SUCC 125 MG IJ SOLR
125.0000 mg | Freq: Four times a day (QID) | INTRAMUSCULAR | Status: DC
  Administered 2021-03-04 – 2021-03-05 (×3): 125 mg via INTRAVENOUS
  Filled 2021-03-04 (×3): qty 2

## 2021-03-04 MED ORDER — SODIUM CHLORIDE 0.9 % IV SOLN
2.0000 g | Freq: Three times a day (TID) | INTRAVENOUS | Status: DC
  Administered 2021-03-05 – 2021-03-08 (×11): 2 g via INTRAVENOUS
  Filled 2021-03-04 (×14): qty 2

## 2021-03-04 MED ORDER — IOHEXOL 350 MG/ML SOLN
100.0000 mL | Freq: Once | INTRAVENOUS | Status: AC | PRN
  Administered 2021-03-04: 100 mL via INTRAVENOUS

## 2021-03-04 MED ORDER — VANCOMYCIN HCL 10 G IV SOLR
2250.0000 mg | INTRAVENOUS | Status: DC
  Administered 2021-03-05 – 2021-03-06 (×2): 2250 mg via INTRAVENOUS
  Filled 2021-03-04 (×3): qty 2250

## 2021-03-04 MED ORDER — IPRATROPIUM-ALBUTEROL 0.5-2.5 (3) MG/3ML IN SOLN
3.0000 mL | Freq: Four times a day (QID) | RESPIRATORY_TRACT | Status: DC
  Administered 2021-03-04 – 2021-03-08 (×12): 3 mL via RESPIRATORY_TRACT
  Filled 2021-03-04 (×10): qty 3

## 2021-03-04 MED ORDER — ALBUTEROL SULFATE HFA 108 (90 BASE) MCG/ACT IN AERS
2.0000 | INHALATION_SPRAY | RESPIRATORY_TRACT | Status: DC | PRN
  Administered 2021-03-05 (×2): 2 via RESPIRATORY_TRACT
  Filled 2021-03-04: qty 6.7

## 2021-03-04 NOTE — ED Triage Notes (Signed)
Patient BIB [facility] from home. Patient in respiratory distress, administering at home neb treatment upon EMS arrival, patient placed on cpap and given 15 albuterol, 0.5 atrovent  Via ems. 82% RA upon EMS arrival.

## 2021-03-04 NOTE — ED Notes (Signed)
Troponin level of 35 called in by lab. Will alert provider

## 2021-03-04 NOTE — Progress Notes (Signed)
Pt taken off BIPAP and placed on 4L [place]. Pt wears 3L at baseline. Pt does have some pursed lip breathing while on [place], but states that is baseline as well. Neb treatments ordered per home reg. RT will continue to monitor.

## 2021-03-04 NOTE — ED Provider Notes (Signed)
[facility] EMERGENCY DEPARTMENT Provider Note   CSN: [id number] Arrival date & time: 03/04/21  1544     History Chief Complaint  Patient presents with  . Respiratory Distress    Isaac [name] is a 65 y.o. male hx of COPD on 4 L [place] at baseline, CLL, HTN, HL, here with shortness of breath, hypoxia.  Patient states that he has trouble breathing acute onset at 3 PM.  Patient states that he gave himself a nebulizer treatment.  EMS got there his oxygen saturation was 82% and he was put on BiPAP.  He states that he is feeling better on BiPAP now.  He states that he was intubated in January for similar symptoms.  At that time he was thought to have COPD exacerbation  The history is provided by the patient.       Past Medical History:  Diagnosis Date  . Acute on chronic respiratory failure with hypoxia ([place])   . Anxiety   . Chronic lymphocytic leukemia in remission ([place])   . COPD (chronic obstructive pulmonary disease) ([place])   . COPD, severe ([place])   . GERD (gastroesophageal reflux disease)   . Hematuria   . Hyperlipidemia   . Hypertension   . Insomnia   . Left lower lobe pneumonia   . Nephrolithiasis   . Tobacco dependence     Patient Active Problem List   Diagnosis Date Noted  . Acute on chronic respiratory failure ([place]) 03/04/2021  . Acute on chronic respiratory failure with hypoxia ([place])   . COPD, severe ([place])   . Left lower lobe pneumonia   . Chronic lymphocytic leukemia in remission ([place])   . CLL (chronic lymphocytic leukemia) ([place])   . Acute respiratory failure with hypercapnia ([place]) 12/08/2020  . Hyperkalemia   . Leukocytosis   . AKI (acute kidney injury) ([place])   . Healthcare maintenance 08/12/2020  . Oral thrush 04/30/2017  . Dysfunction of right eustachian tube 01/19/2016  . Left ear impacted cerumen 01/19/2016  . COPD, very severe ([place]) 08/22/2015  . Chronic respiratory failure with hypercapnia ([place]) 06/08/2014  . Unspecified sleep apnea  06/08/2014  . Chronic diastolic heart failure ([place]) 05/11/2014  . Acute-on-chronic respiratory failure ([place]) 04/24/2014  . Sinusitis 04/13/2014  . Renal insufficiency 07/07/2012  . Hypertension 07/07/2012  . COPD exacerbation ([place]) 07/04/2012  . COPD, severe ([place]) 04/25/2012  . Sleep apnea 04/25/2012  . Obesity 04/25/2012    Past Surgical History:  Procedure Laterality Date  . LITHOTRIPSY         Family History  Problem Relation Age of Onset  . Heart disease Father   . Stroke Mother   . Leukemia Brother     Social History   Tobacco Use  . Smoking status: Former Smoker    Packs/day: 1.00    Years: 35.00    Pack years: 35.00    Types: Cigarettes    Quit date: 12/07/2008    Years since quitting: 12.2  . Smokeless tobacco: Never Used  Vaping Use  . Vaping Use: Never used    Home Medications Prior to Admission medications   Medication Sig Start Date End Date Taking? Authorizing Provider  acetaminophen (TYLENOL) 160 MG/5ML solution Place 20.3 mLs (650 mg total) into feeding tube every 6 (six) hours as needed for headache. 12/19/20   Donita Brooks, NP  albuterol (PROVENTIL) (2.5 MG/3ML) 0.083% nebulizer solution Take 3 mLs (2.5 mg total) by nebulization every 4 (four) hours as needed for wheezing  or shortness of breath. 12/19/20   Donita Brooks, NP  allopurinol (ZYLOPRIM) 100 MG tablet Place 1 tablet (100 mg total) into feeding tube daily. 12/20/20   Donita Brooks, NP  arformoterol (BROVANA) 15 MCG/2ML NEBU Take 2 mLs (15 mcg total) by nebulization 2 (two) times daily. 12/19/20   Donita Brooks, NP  budesonide (PULMICORT) 0.5 MG/2ML nebulizer solution Take 2 mLs (0.5 mg total) by nebulization 2 (two) times daily. 12/19/20   Donita Brooks, NP  Chlorhexidine Gluconate Cloth 2 % PADS Apply 6 each topically daily. 12/19/20   Donita Brooks, NP  chlorhexidine gluconate, MEDLINE KIT, (PERIDEX) 0.12 % solution 15 mLs by Mouth Rinse route 2 (two) times daily. 12/19/20   Donita Brooks, NP  cloNIDine (CATAPRES - DOSED IN MG/24 HR) 0.1 mg/24hr patch Place 1 patch onto the skin once a week. 03/02/21   [provider]  cloNIDine (CATAPRES) 0.1 MG tablet Place 1 tablet (0.1 mg total) into feeding tube every 6 (six) hours. 12/19/20   Donita Brooks, NP  dicyclomine (BENTYL) 10 MG capsule Take by mouth. 03/02/21   [provider]  docusate (COLACE) 50 MG/5ML liquid Place 10 mLs (100 mg total) into feeding tube 2 (two) times daily as needed for mild constipation. 12/19/20   Donita Brooks, NP  enoxaparin (LOVENOX) 60 MG/0.6ML injection Inject 0.6 mLs (60 mg total) into the skin daily. 12/20/20   Donita Brooks, NP  fluticasone (FLONASE) 50 MCG/ACT nasal spray Place 1 spray into both nostrils daily. 12/20/20   Donita Brooks, NP  furosemide (LASIX) 10 MG/ML injection Inject 4 mLs (40 mg total) into the vein 2 (two) times daily. 12/19/20   Donita Brooks, NP  furosemide (LASIX) 40 MG tablet Take 40 mg by mouth daily. 03/02/21   [provider]  hydrALAZINE (APRESOLINE) 20 MG/ML injection Inject 0.5 mLs (10 mg total) into the vein every 4 (four) hours as needed (SBP 160 or DBP > 90). 12/19/20   Donita Brooks, NP  insulin aspart (NOVOLOG) 100 UNIT/ML injection Inject 0-20 Units into the skin every 4 (four) hours. 12/19/20   Donita Brooks, NP  ipratropium-albuterol (DUONEB) 0.5-2.5 (3) MG/3ML SOLN Take 3 mLs by nebulization every 6 (six) hours as needed. 02/17/21   Brand Males, MD  metoprolol tartrate (LOPRESSOR) 25 MG tablet Take 25 mg by mouth 2 (two) times daily. 01/26/21   [provider]  Mouthwashes (MOUTH RINSE) LIQD solution 15 mLs by Mouth Rinse route QID. 12/19/20   Donita Brooks, NP  Nutritional Supplements (FEEDING SUPPLEMENT, PROSOURCE TF,) liquid Place 45 mLs into feeding tube daily. 12/20/20   Donita Brooks, NP  Nutritional Supplements (FEEDING SUPPLEMENT, VITAL HIGH PROTEIN,) LIQD liquid Place 1,000 mLs into feeding tube continuous.  12/19/20   Donita Brooks, NP  ondansetron (ZOFRAN) 4 MG/2ML SOLN injection Inject 2 mLs (4 mg total) into the vein every 6 (six) hours as needed for nausea or vomiting. 12/19/20   Donita Brooks, NP  pantoprazole (PROTONIX) 40 MG tablet Take 40 mg by mouth daily. 03/02/21   [provider]  pantoprazole sodium (PROTONIX) 40 mg/20 mL PACK Place 20 mLs (40 mg total) into feeding tube at bedtime. 12/19/20   Donita Brooks, NP  polyethylene glycol (MIRALAX / GLYCOLAX) 17 g packet Place 17 g into feeding tube daily as needed for moderate constipation. 12/19/20   Donita Brooks, NP  predniSONE (DELTASONE) 20 MG tablet Place  1 tablet (20 mg total) into feeding tube daily with breakfast. 12/20/20   Donita Brooks, NP  revefenacin (YUPELRI) 175 MCG/3ML nebulizer solution Take 3 mLs (175 mcg total) by nebulization daily. 12/20/20   Donita Brooks, NP  senna (SENOKOT) 8.6 MG TABS tablet Place 1 tablet (8.6 mg total) into feeding tube daily as needed for moderate constipation (non responsive to miralax). 12/19/20   Donita Brooks, NP  sodium chloride (OCEAN) 0.65 % SOLN nasal spray Place 2 sprays into both nostrils as needed for congestion. 05/05/14   Erick Colace, NP  Water For Irrigation, Sterile (FREE WATER) SOLN Place 200 mLs into feeding tube every 8 (eight) hours. 12/19/20   Donita Brooks, NP    Allergies    Codeine and Prozac [fluoxetine hcl]  Review of Systems   Review of Systems  Respiratory: Positive for shortness of breath.   All other systems reviewed and are negative.   Physical Exam Updated Vital Signs BP 130/80   Pulse (!) 106   Temp 98.2 F (36.8 C) (Oral)   Resp (!) 22   Ht _0  (1.778 m)   Wt 120 kg   SpO2 98%   BMI 37.96 kg/m   Physical Exam Vitals and nursing note reviewed.  Constitutional:      Comments: Moderate respiratory distress  HENT:     Head: Normocephalic.     Nose: Nose normal.     Mouth/Throat:     Mouth: Mucous membranes are moist.  Eyes:      Extraocular Movements: Extraocular movements intact.     Pupils: Pupils are equal, round, and reactive to light.  Cardiovascular:     Rate and Rhythm: Normal rate and regular rhythm.     Pulses: Normal pulses.     Heart sounds: Normal heart sounds.  Pulmonary:     Comments: Tachypneic and talking in 2-3 word sentences.  Patient has poor air movement.  Patient has minimal wheezing throughout Abdominal:     General: Abdomen is flat.  Musculoskeletal:        General: Normal range of motion.     Cervical back: Normal range of motion and neck supple.  Skin:    General: Skin is warm.     Capillary Refill: Capillary refill takes less than 2 seconds.  Neurological:     General: No focal deficit present.     Mental Status: He is alert.  Psychiatric:        Mood and Affect: Mood normal.        Behavior: Behavior normal.     ED Results / Procedures / Treatments   Labs (all labs ordered are listed, but only abnormal results are displayed) Labs Reviewed  CBC WITH DIFFERENTIAL/PLATELET - Abnormal; Notable for the following components:      Result Value   WBC 130.4 (*)    Hemoglobin 12.2 (*)    MCHC 29.5 (*)    Lymphs Abs 107.0 (*)    Monocytes Absolute 18.2 (*)    Basophils Absolute 0.7 (*)    Abs Immature Granulocytes 0.23 (*)    All other components within normal limits  COMPREHENSIVE METABOLIC PANEL - Abnormal; Notable for the following components:   Potassium 5.3 (*)    Glucose, Bld 116 (*)    Total Protein 6.1 (*)    Alkaline Phosphatase 128 (*)    All other components within normal limits  I-STAT CHEM 8, ED - Abnormal; Notable for the following components:  Glucose, Bld 110 (*)    Calcium, Ion 1.13 (*)    All other components within normal limits  I-STAT ARTERIAL BLOOD GAS, ED - Abnormal; Notable for the following components:   pH, Arterial 7.298 (*)    pCO2 arterial 69.8 (*)    pO2, Arterial 168 (*)    Bicarbonate 34.3 (*)    TCO2 36 (*)    Acid-Base Excess 5.0  (*)    All other components within normal limits  CULTURE, BLOOD (ROUTINE X 2)  CULTURE, BLOOD (ROUTINE X 2)  BRAIN NATRIURETIC PEPTIDE  BLOOD GAS, ARTERIAL  PATHOLOGIST SMEAR REVIEW  LACTIC ACID, PLASMA  LACTIC ACID, PLASMA  TROPONIN I (HIGH SENSITIVITY)  TROPONIN I (HIGH SENSITIVITY)    EKG EKG Interpretation  Date/Time:  Saturday March 04 2021 15:49:30 EDT Ventricular Rate:  119 PR Interval:  157 QRS Duration: 137 QT Interval:  333 QTC Calculation: 469 R Axis:   124 Text Interpretation: Sinus tachycardia Ventricular premature complex Right bundle branch block No significant change since last tracing Confirmed by Wandra Arthurs [phone]) on 03/04/2021 4:12:26 PM   Radiology CT Angio Chest PE W and/or Wo Contrast  Result Date: 03/04/2021 CLINICAL DATA:  66 year old who currently uses BiPAP for sleep apnea, presenting with acute onset of shortness of breath and hypoxia. Personal history of chronic lymphocytic leukemia. EXAM: CT ANGIOGRAPHY CHEST WITH CONTRAST TECHNIQUE: Multidetector CT imaging of the chest was performed using the standard protocol during bolus administration of intravenous contrast. Multiplanar CT image reconstructions and MIPs were obtained to evaluate the vascular anatomy. CONTRAST:  160m OMNIPAQUE IOHEXOL 350 MG/ML IV. COMPARISON:  None. FINDINGS: Cardiovascular: Contrast opacification of pulmonary arteries is very good. No filling defects within either main pulmonary artery or their segmental branches in either lung to suggest pulmonary embolism. Heart mildly to moderately enlarged. Moderate to severe three-vessel coronary atherosclerosis. No pericardial effusion. Mild atherosclerosis involving the thoracic aorta without evidence of aneurysm. Proximal great vessels widely patent. Mediastinum/Nodes: Extensive lymphadenopathy throughout the visualized lower cervical chains in the neck bilaterally, both axilla, throughout the mediastinum and both hila and in the retrocrural  space. An index largest RIGHT axillary lymph node measures approximately 4.8 x 3.6 cm (5/36). An index largest LEFT axillary node measures approximately 4.0 x 3.0 cm (5/23). Index conglomerate subcarinal mediastinal lymphadenopathy measures approximately 5.0 x 2.2 cm (5/80). Normal appearing esophagus.  Normal-appearing thyroid gland. Lungs/Pleura: Emphysematous changes throughout both lungs. Ground-glass airspace opacities involving the upper lobes bilaterally (RIGHT greater than LEFT), the RIGHT LOWER LOBE, and the RIGHT MIDDLE LOBE. Pleural thickening involving the major fissure on the LEFT. No pleural effusions. Central airways patent. Upper Abdomen: Benign cyst involving the LEFT hepatic lobe measuring approximately 5.5 x 5.8 cm (5/140). Massive splenic enlargement (the spleen is incompletely imaged). Numerous enlarged lymph nodes in the upper abdomen in the gastrohepatic ligament, the portacaval region, the aortocaval region and the periaortic region. Musculoskeletal: Mild degenerative changes involving the thoracic spine. No acute findings. Review of the MIP images confirms the above findings. IMPRESSION: 1. No evidence of pulmonary embolism. 2. Ground-glass airspace opacities involving the upper lobes bilaterally, the RIGHT LOWER LOBE, and the RIGHT MIDDLE LOBE, likely indicating pneumonia. 3. Extensive lymphadenopathy throughout the visualized lower cervical chains bilaterally, both axilla, the mediastinum, both hila, the retrocrural space and the upper abdomen. Index lymph nodes are measured above. This likely indicates recurrence of the patient's CLL or transformation into lymphoma. 4. Massive splenic enlargement (the spleen is incompletely imaged). 5. Aortic Atherosclerosis (  ICD10-I70.0) and Emphysema (ICD10-J43.9). Electronically Signed   By: Evangeline Dakin M.D.   On: 03/04/2021 18:41   DG Chest Port 1 View  Result Date: 03/04/2021 CLINICAL DATA:  Shortness of breath.  Respiratory distress. EXAM:  PORTABLE CHEST 1 VIEW COMPARISON:  12/19/2020 FINDINGS: Stable enlarged cardiac silhouette. The central pulmonary arteries are prominent. The lungs are hyperexpanded with diffuse prominence of the interstitial markings, bullous changes on the left and normal vasculature. Diffuse osteopenia. IMPRESSION: Stable cardiomegaly and changes of COPD. No acute abnormality. Electronically Signed   By: Claudie Revering M.D.   On: 03/04/2021 16:09    Procedures Procedures   CRITICAL CARE Performed by: Wandra Arthurs   Total critical care time: 40  minutes  Critical care time was exclusive of separately billable procedures and treating other patients.  Critical care was necessary to treat or prevent imminent or life-threatening deterioration.  Critical care was time spent personally by me on the following activities: development of treatment plan with patient and/or surrogate as well as nursing, discussions with consultants, evaluation of patient's response to treatment, examination of patient, obtaining history from patient or surrogate, ordering and performing treatments and interventions, ordering and review of laboratory studies, ordering and review of radiographic studies, pulse oximetry and re-evaluation of patient's condition.   Medications Ordered in ED Medications  vancomycin (VANCOREADY) IVPB 2000 mg/400 mL (has no administration in time range)  ceFEPIme (MAXIPIME) 2 g in sodium chloride 0.9 % 100 mL IVPB (has no administration in time range)  ceFEPIme (MAXIPIME) 2 g in sodium chloride 0.9 % 100 mL IVPB (has no administration in time range)  iohexol (OMNIPAQUE) 350 MG/ML injection 100 mL (100 mLs Intravenous Contrast Given 03/04/21 1811)    ED Course  I have reviewed the triage vital signs and the nursing notes.  Pertinent labs & imaging results that were available during my care of the patient were reviewed by me and considered in my medical decision making (see chart for details).    MDM  Rules/Calculators/A&P                         Isaac [name] is a 65 y.o. male who presented with shortness of breath.  Patient is on BiPAP on arrival.  His O2 saturation is 100% on BiPAP. Concern for possible COPD versus CHF versus pneumonia versus PE.  Plan to get CBC and CMP and chest x-ray and CTA chest  7:13 PM Patient was also count was 130 but he has CLL at baseline white blood cell count is 200. His BNP is normal.  Patient already received Solu-Medrol and magnesium and continuous neb by EMS.  His pH is 7.29 and his CO2 is 70.  CTA shows multifocal pneumonia.  Given broad-spectrum antibiotics.  Will admit for hypoxia from COPD, pneumonia    Final Clinical Impression(s) / ED Diagnoses Final diagnoses:  None    Rx / DC Orders ED Discharge Orders    None       Drenda Freeze, MD 03/04/21 [phone]

## 2021-03-04 NOTE — H&P (Signed)
History and Physical   Isaac [name] RCV:[id number] DOB: 07/01/1956 DOA: 03/04/2021  Referring MD/NP/PA: Dr. Darl Householder  PCP: Jani Gravel, MD   Patient coming from: Home  Chief Complaint: Shortness of breath  HPI: Isaac [name] is a 65 y.o. male with medical history significant of advanced COPD on home O2 usually at 3 L/min, anxiety disorder, morbid obesity, hypertension, hyperlipidemia, tobacco dependence and GERD, obstructive sleep apnea who presents to the ER with sudden onset of shortness of breath cough and significant hypoxemia.  Patient has had prior admissions including intubation 3 months ago.  He has history of CLL that is in remission.  Shortness of breath with setting started first yesterday with some cough but got worse today.  EMS arrived: Patient was found to be hypoxic with oxygen sat 82% on his oxygen.  He was placed on BiPAP and transported to the hospital.  Patient currently on BiPAP improved without need for intubation.  Work-up showed multilobar pneumonia.  Is being admitted with healthcare associated pneumonia and COPD exacerbation..  ED Course: Temperature 98.2 blood pressure 179/30 pulse 120 respirate 26 oxygen sat 94% room air.  The rest of the chemistry and CBC appear to be within normal ABG showed a pH of 7.298 PCO2 69.8 and PO2 165.  Currently chest x-ray showed stable cardiomegaly and changes of COPD no acute findings.  CT angiogram of the chest showed no pulmonary embolus but shows groundglass airspace opacity in the right lower lobe and right middle lobe.  There is extensive lymphadenopathy consistent with his CLL.  Patient then being admitted with acute on chronic respiratory failure with hypoxia and hypercarbia due to pneumonia and COPD exacerbation.  Review of Systems: As per HPI otherwise 10 point review of systems negative.    Past Medical History:  Diagnosis Date  . Acute on chronic respiratory failure with hypoxia ([place])   . Anxiety   . Chronic lymphocytic  leukemia in remission ([employer])   . COPD (chronic obstructive pulmonary disease) ([place])   . COPD, severe ([place])   . GERD (gastroesophageal reflux disease)   . Hematuria   . Hyperlipidemia   . Hypertension   . Insomnia   . Left lower lobe pneumonia   . Nephrolithiasis   . Tobacco dependence     Past Surgical History:  Procedure Laterality Date  . LITHOTRIPSY       reports that he quit smoking about 12 years ago. His smoking use included cigarettes. He has a 35.00 pack-year smoking history. He has never used smokeless tobacco. No history on file for alcohol use and drug use.  Allergies  Allergen Reactions  . Codeine Itching    "jittery"  . Prozac [Fluoxetine Hcl]     confusion    Family History  Problem Relation Age of Onset  . Heart disease Father   . Stroke Mother   . Leukemia Brother      Prior to Admission medications   Medication Sig Start Date End Date Taking? Authorizing Provider  acetaminophen (TYLENOL) 160 MG/5ML solution Place 20.3 mLs (650 mg total) into feeding tube every 6 (six) hours as needed for headache. 12/19/20   Donita Brooks, NP  albuterol (PROVENTIL) (2.5 MG/3ML) 0.083% nebulizer solution Take 3 mLs (2.5 mg total) by nebulization every 4 (four) hours as needed for wheezing or shortness of breath. 12/19/20   Donita Brooks, NP  allopurinol (ZYLOPRIM) 100 MG tablet Place 1 tablet (100 mg total) into feeding tube daily. 12/20/20   Alfredo Martinez,  Brandi L, NP  arformoterol (BROVANA) 15 MCG/2ML NEBU Take 2 mLs (15 mcg total) by nebulization 2 (two) times daily. 12/19/20   Donita Brooks, NP  budesonide (PULMICORT) 0.5 MG/2ML nebulizer solution Take 2 mLs (0.5 mg total) by nebulization 2 (two) times daily. 12/19/20   Donita Brooks, NP  Chlorhexidine Gluconate Cloth 2 % PADS Apply 6 each topically daily. 12/19/20   Donita Brooks, NP  chlorhexidine gluconate, MEDLINE KIT, (PERIDEX) 0.12 % solution 15 mLs by Mouth Rinse route 2 (two) times daily. 12/19/20   Donita Brooks,  NP  cloNIDine (CATAPRES - DOSED IN MG/24 HR) 0.1 mg/24hr patch Place 1 patch onto the skin once a week. 03/02/21   [provider]  cloNIDine (CATAPRES) 0.1 MG tablet Place 1 tablet (0.1 mg total) into feeding tube every 6 (six) hours. 12/19/20   Donita Brooks, NP  dicyclomine (BENTYL) 10 MG capsule Take by mouth. 03/02/21   [provider]  docusate (COLACE) 50 MG/5ML liquid Place 10 mLs (100 mg total) into feeding tube 2 (two) times daily as needed for mild constipation. 12/19/20   Donita Brooks, NP  enoxaparin (LOVENOX) 60 MG/0.6ML injection Inject 0.6 mLs (60 mg total) into the skin daily. 12/20/20   Donita Brooks, NP  fluticasone (FLONASE) 50 MCG/ACT nasal spray Place 1 spray into both nostrils daily. 12/20/20   Donita Brooks, NP  furosemide (LASIX) 10 MG/ML injection Inject 4 mLs (40 mg total) into the vein 2 (two) times daily. 12/19/20   Donita Brooks, NP  furosemide (LASIX) 40 MG tablet Take 40 mg by mouth daily. 03/02/21   [provider]  hydrALAZINE (APRESOLINE) 20 MG/ML injection Inject 0.5 mLs (10 mg total) into the vein every 4 (four) hours as needed (SBP 160 or DBP > 90). 12/19/20   Donita Brooks, NP  insulin aspart (NOVOLOG) 100 UNIT/ML injection Inject 0-20 Units into the skin every 4 (four) hours. 12/19/20   Donita Brooks, NP  ipratropium-albuterol (DUONEB) 0.5-2.5 (3) MG/3ML SOLN Take 3 mLs by nebulization every 6 (six) hours as needed. 02/17/21   Brand Males, MD  metoprolol tartrate (LOPRESSOR) 25 MG tablet Take 25 mg by mouth 2 (two) times daily. 01/26/21   [provider]  Mouthwashes (MOUTH RINSE) LIQD solution 15 mLs by Mouth Rinse route QID. 12/19/20   Donita Brooks, NP  Nutritional Supplements (FEEDING SUPPLEMENT, PROSOURCE TF,) liquid Place 45 mLs into feeding tube daily. 12/20/20   Donita Brooks, NP  Nutritional Supplements (FEEDING SUPPLEMENT, VITAL HIGH PROTEIN,) LIQD liquid Place 1,000 mLs into feeding tube continuous. 12/19/20    Donita Brooks, NP  ondansetron (ZOFRAN) 4 MG/2ML SOLN injection Inject 2 mLs (4 mg total) into the vein every 6 (six) hours as needed for nausea or vomiting. 12/19/20   Donita Brooks, NP  pantoprazole (PROTONIX) 40 MG tablet Take 40 mg by mouth daily. 03/02/21   [provider]  pantoprazole sodium (PROTONIX) 40 mg/20 mL PACK Place 20 mLs (40 mg total) into feeding tube at bedtime. 12/19/20   Donita Brooks, NP  polyethylene glycol (MIRALAX / GLYCOLAX) 17 g packet Place 17 g into feeding tube daily as needed for moderate constipation. 12/19/20   Donita Brooks, NP  predniSONE (DELTASONE) 20 MG tablet Place 1 tablet (20 mg total) into feeding tube daily with breakfast. 12/20/20   Donita Brooks, NP  revefenacin (YUPELRI) 175 MCG/3ML nebulizer solution Take 3 mLs (175 mcg total)  by nebulization daily. 12/20/20   Donita Brooks, NP  senna (SENOKOT) 8.6 MG TABS tablet Place 1 tablet (8.6 mg total) into feeding tube daily as needed for moderate constipation (non responsive to miralax). 12/19/20   Donita Brooks, NP  sodium chloride (OCEAN) 0.65 % SOLN nasal spray Place 2 sprays into both nostrils as needed for congestion. 05/05/14   Erick Colace, NP  Water For Irrigation, Sterile (FREE WATER) SOLN Place 200 mLs into feeding tube every 8 (eight) hours. 12/19/20   Donita Brooks, NP    Physical Exam: Vitals:   03/04/21 1615 03/04/21 1715 03/04/21 1730 03/04/21 1745  BP: (!) 155/130 129/77 134/78 130/80  Pulse: (!) 119 (!) 111 (!) 108 (!) 106  Resp: (!) 22 (!) 23 (!) 21 (!) 22  Temp:      TempSrc:      SpO2: 99% 98% 97% 98%  Weight:      Height:          Constitutional: Acutely ill looking in respiratory distress Vitals:   03/04/21 1615 03/04/21 1715 03/04/21 1730 03/04/21 1745  BP: (!) 155/130 129/77 134/78 130/80  Pulse: (!) 119 (!) 111 (!) 108 (!) 106  Resp: (!) 22 (!) 23 (!) 21 (!) 22  Temp:      TempSrc:      SpO2: 99% 98% 97% 98%  Weight:      Height:       Eyes:  PERRL, lids and conjunctivae normal ENMT: Mucous membranes are dry. Posterior pharynx clear of any exudate or lesions.Normal dentition.  Neck: normal, supple, no masses, no thyromegaly Respiratory: Decreased air entry bilaterally with marked expiratory wheezing, use of accessory muscles of respiration.  Cardiovascular: Sinus tachycardia, no murmurs / rubs / gallops. No extremity edema. 2+ pedal pulses. No carotid bruits.  Abdomen: no tenderness, no masses palpated. No hepatosplenomegaly. Bowel sounds positive.  Musculoskeletal: no clubbing / cyanosis. No joint deformity upper and lower extremities. Good ROM, no contractures. Normal muscle tone.  Skin: no rashes, lesions, ulcers. No induration Neurologic: CN 2-12 grossly intact. Sensation intact, DTR normal. Strength 5/5 in all 4.  Psychiatric: Normal judgment and insight. Alert and oriented x 3. Normal mood.     Labs on Admission: I have personally reviewed following labs and imaging studies  CBC: Recent Labs  Lab 03/04/21 1554 03/04/21 1609 03/04/21 1610  WBC 130.4*  --   --   NEUTROABS 4.2  --   --   HGB 12.2* 13.3 13.9  HCT 41.4 39.0 41.0  MCV 95.0  --   --   PLT 235  --   --    Basic Metabolic Panel: Recent Labs  Lab 03/04/21 1554 03/04/21 1609 03/04/21 1610  NA 139 140 138  K 5.3* 4.2 4.8  CL 102  --  100  CO2 31  --   --   GLUCOSE 116*  --  110*  BUN 10  --  12  CREATININE 1.01  --  0.90  CALCIUM 9.1  --   --    GFR: Estimated Creatinine Clearance: 106.3 mL/min (by C-G formula based on SCr of 0.9 mg/dL). Liver Function Tests: Recent Labs  Lab 03/04/21 1554  AST 28  ALT 12  ALKPHOS 128*  BILITOT 1.0  PROT 6.1*  ALBUMIN 3.7   No results for input(s): LIPASE, AMYLASE in the last 168 hours. No results for input(s): AMMONIA in the last 168 hours. Coagulation Profile: No results for input(s): INR, PROTIME in  the last 168 hours. Cardiac Enzymes: No results for input(s): CKTOTAL, CKMB, CKMBINDEX, TROPONINI  in the last 168 hours. BNP (last 3 results) No results for input(s): PROBNP in the last 8760 hours. HbA1C: No results for input(s): HGBA1C in the last 72 hours. CBG: No results for input(s): GLUCAP in the last 168 hours. Lipid Profile: No results for input(s): CHOL, HDL, LDLCALC, TRIG, CHOLHDL, LDLDIRECT in the last 72 hours. Thyroid Function Tests: No results for input(s): TSH, T4TOTAL, FREET4, T3FREE, THYROIDAB in the last 72 hours. Anemia Panel: No results for input(s): VITAMINB12, FOLATE, FERRITIN, TIBC, IRON, RETICCTPCT in the last 72 hours. Urine analysis:    Component Value Date/Time   COLORURINE YELLOW 12/17/2016 1220   APPEARANCEUR CLEAR 12/17/2016 1220   LABSPEC 1.024 12/17/2016 1220   PHURINE 5.0 12/17/2016 1220   GLUCOSEU NEGATIVE 12/17/2016 1220   HGBUR NEGATIVE 12/17/2016 1220   BILIRUBINUR NEGATIVE 12/17/2016 1220   KETONESUR NEGATIVE 12/17/2016 1220   PROTEINUR NEGATIVE 12/17/2016 1220   UROBILINOGEN 0.2 04/24/2014 0558   NITRITE NEGATIVE 12/17/2016 1220   LEUKOCYTESUR MODERATE (A) 12/17/2016 1220   Sepsis Labs: _0 (procalcitonin:4,lacticidven:4) )No results found for this or any previous visit (from the past 240 hour(s)).   Radiological Exams on Admission: CT Angio Chest PE W and/or Wo Contrast  Result Date: 03/04/2021 CLINICAL DATA:  64 year old who currently uses BiPAP for sleep apnea, presenting with acute onset of shortness of breath and hypoxia. Personal history of chronic lymphocytic leukemia. EXAM: CT ANGIOGRAPHY CHEST WITH CONTRAST TECHNIQUE: Multidetector CT imaging of the chest was performed using the standard protocol during bolus administration of intravenous contrast. Multiplanar CT image reconstructions and MIPs were obtained to evaluate the vascular anatomy. CONTRAST:  141m OMNIPAQUE IOHEXOL 350 MG/ML IV. COMPARISON:  None. FINDINGS: Cardiovascular: Contrast opacification of pulmonary arteries is very good. No filling defects within either  main pulmonary artery or their segmental branches in either lung to suggest pulmonary embolism. Heart mildly to moderately enlarged. Moderate to severe three-vessel coronary atherosclerosis. No pericardial effusion. Mild atherosclerosis involving the thoracic aorta without evidence of aneurysm. Proximal great vessels widely patent. Mediastinum/Nodes: Extensive lymphadenopathy throughout the visualized lower cervical chains in the neck bilaterally, both axilla, throughout the mediastinum and both hila and in the retrocrural space. An index largest RIGHT axillary lymph node measures approximately 4.8 x 3.6 cm (5/36). An index largest LEFT axillary node measures approximately 4.0 x 3.0 cm (5/23). Index conglomerate subcarinal mediastinal lymphadenopathy measures approximately 5.0 x 2.2 cm (5/80). Normal appearing esophagus.  Normal-appearing thyroid gland. Lungs/Pleura: Emphysematous changes throughout both lungs. Ground-glass airspace opacities involving the upper lobes bilaterally (RIGHT greater than LEFT), the RIGHT LOWER LOBE, and the RIGHT MIDDLE LOBE. Pleural thickening involving the major fissure on the LEFT. No pleural effusions. Central airways patent. Upper Abdomen: Benign cyst involving the LEFT hepatic lobe measuring approximately 5.5 x 5.8 cm (5/140). Massive splenic enlargement (the spleen is incompletely imaged). Numerous enlarged lymph nodes in the upper abdomen in the gastrohepatic ligament, the portacaval region, the aortocaval region and the periaortic region. Musculoskeletal: Mild degenerative changes involving the thoracic spine. No acute findings. Review of the MIP images confirms the above findings. IMPRESSION: 1. No evidence of pulmonary embolism. 2. Ground-glass airspace opacities involving the upper lobes bilaterally, the RIGHT LOWER LOBE, and the RIGHT MIDDLE LOBE, likely indicating pneumonia. 3. Extensive lymphadenopathy throughout the visualized lower cervical chains bilaterally, both  axilla, the mediastinum, both hila, the retrocrural space and the upper abdomen. Index lymph nodes are measured above. This likely  indicates recurrence of the patient's CLL or transformation into lymphoma. 4. Massive splenic enlargement (the spleen is incompletely imaged). 5. Aortic Atherosclerosis (ICD10-I70.0) and Emphysema (ICD10-J43.9). Electronically Signed   By: Evangeline Dakin M.D.   On: 03/04/2021 18:41   DG Chest Port 1 View  Result Date: 03/04/2021 CLINICAL DATA:  Shortness of breath.  Respiratory distress. EXAM: PORTABLE CHEST 1 VIEW COMPARISON:  12/19/2020 FINDINGS: Stable enlarged cardiac silhouette. The central pulmonary arteries are prominent. The lungs are hyperexpanded with diffuse prominence of the interstitial markings, bullous changes on the left and normal vasculature. Diffuse osteopenia. IMPRESSION: Stable cardiomegaly and changes of COPD. No acute abnormality. Electronically Signed   By: Claudie Revering M.D.   On: 03/04/2021 16:09     Assessment/Plan Principal Problem:   Acute on chronic respiratory failure with hypoxia ([facility]) Active Problems:   Obesity   COPD exacerbation ([facility])   Hypertension   Chronic diastolic heart failure ([facility])   Hyperkalemia   Chronic lymphocytic leukemia in remission ([place])   HCAP (healthcare-associated pneumonia)     #1 acute on chronic respiratory failure with hypoxia: Patient will be admitted with acute on chronic respiratory failure hypoxia and hypercarbia.  We will continue with the BiPAP and try and titrate him off.  Steroids with empiric antibiotics for HCAP.  Await blood cultures.  Continue other measures from home including nebulizer treatments once COVID-19 is negative.  #2 acute exacerbation COPD: Continue as per above.  #3 CLL: Continue outpatient treatment.  #4 morbid obesity:.  Dietary counseling  #5 Healthcare associated pneumonia: As per #1 above.  #6 hyperkalemia: Initiate albuterol and other breathing treatments and follow  potassium level.  #7 essential hypertension: Confirm on resume home regimen     DVT prophylaxis: Lovenox Code Status: Full code Family Communication: Wife at bedside Disposition Plan: Home Consults called: None Admission status: Inpatient to progressive care  Severity of Illness: The appropriate patient status for this patient is INPATIENT. Inpatient status is judged to be reasonable and necessary in order to provide the required intensity of service to ensure the patient's safety. The patient's presenting symptoms, physical exam findings, and initial radiographic and laboratory data in the context of their chronic comorbidities is felt to place them at high risk for further clinical deterioration. Furthermore, it is not anticipated that the patient will be medically stable for discharge from the hospital within 2 midnights of admission. The following factors support the patient status of inpatient.   " The patient's presenting symptoms include shortness of breath. " The worrisome physical exam findings include decreased air entry bilaterally with mild expiratory wheezing. " The initial radiographic and laboratory data are worrisome because of evidence of pneumonia on CT. " The chronic co-morbidities include severe COPD.   * I certify that at the point of admission it is my clinical judgment that the patient will require inpatient hospital care spanning beyond 2 midnights from the point of admission due to high intensity of service, high risk for further deterioration and high frequency of surveillance required.Barbette Merino MD [facility] Pager [phone]  If 7PM-7AM, please contact night-coverage [email or web address] Password [facility]  03/04/2021, 7:35 PM

## 2021-03-04 NOTE — Progress Notes (Signed)
Pharmacy Antibiotic Note  Isaac [name] is a 65 y.o. male admitted on 03/04/2021 with pneumonia.  Pharmacy has been consulted for Cefepime and Vancomycin dosing.  Plan: Cefepime 2gm IV q8h Vancomycin 2000mg  IV now then 2250mg  IV Q 24 hrs. Goal AUC 400-550. Expected AUC: 500; Vd coeff 0.5 SCr used: 0.9 Will f/u renal function, micro data, and pt's clinical condition Vanc levels prn   Height: 5\' 10"  (177.8 cm) Weight: 120 kg (264 lb 8.8 oz) IBW/kg (Calculated) : 73  Temp (24hrs), Avg:98.2 F (36.8 C), Min:98.2 F (36.8 C), Max:98.2 F (36.8 C)  Recent Labs  Lab 03/04/21 1554 03/04/21 1610  WBC 130.4*  --   CREATININE 1.01 0.90    Estimated Creatinine Clearance: 106.3 mL/min (by C-G formula based on SCr of 0.9 mg/dL).    Allergies  Allergen Reactions  . Codeine Itching    "jittery"  . Prozac [Fluoxetine Hcl]     confusion    Antimicrobials this admission: 4/9 Vanc >>  4/9 Cefepime >>   Microbiology results: Pending  Thank you for allowing pharmacy to be a part of this patient's care.  Sherlon Handing, PharmD, BCPS Please see amion for complete clinical pharmacist phone list 03/04/2021 7:38 PM

## 2021-03-04 NOTE — ED Notes (Signed)
Attempted report x1. 

## 2021-03-04 NOTE — Plan of Care (Signed)

## 2021-03-04 NOTE — Progress Notes (Signed)
Pt transported on BIPAP to CT and back to [facility] without any complications.

## 2021-03-05 DIAGNOSIS — R918 Other nonspecific abnormal finding of lung field: Secondary | ICD-10-CM

## 2021-03-05 DIAGNOSIS — J441 Chronic obstructive pulmonary disease with (acute) exacerbation: Secondary | ICD-10-CM

## 2021-03-05 DIAGNOSIS — C9111 Chronic lymphocytic leukemia of B-cell type in remission: Secondary | ICD-10-CM

## 2021-03-05 LAB — CBC WITH DIFFERENTIAL/PLATELET
Abs Immature Granulocytes: 0.21 10*3/uL — ABNORMAL HIGH (ref 0.00–0.07)
Basophils Absolute: 0.1 10*3/uL (ref 0.0–0.1)
Basophils Relative: 0 %
Eosinophils Absolute: 0 10*3/uL (ref 0.0–0.5)
Eosinophils Relative: 0 %
HCT: 38.8 % — ABNORMAL LOW (ref 39.0–52.0)
Hemoglobin: 11.7 g/dL — ABNORMAL LOW (ref 13.0–17.0)
Immature Granulocytes: 0 %
Lymphocytes Relative: 75 %
Lymphs Abs: 92.9 10*3/uL — ABNORMAL HIGH (ref 0.7–4.0)
MCH: 27.4 pg (ref 26.0–34.0)
MCHC: 30.2 g/dL (ref 30.0–36.0)
MCV: 90.9 fL (ref 80.0–100.0)
Monocytes Absolute: 26.7 10*3/uL — ABNORMAL HIGH (ref 0.1–1.0)
Monocytes Relative: 22 %
Neutro Abs: 4.4 10*3/uL (ref 1.7–7.7)
Neutrophils Relative %: 3 %
Platelets: 229 10*3/uL (ref 150–400)
RBC: 4.27 MIL/uL (ref 4.22–5.81)
RDW: 14.6 % (ref 11.5–15.5)
WBC: 124.3 10*3/uL (ref 4.0–10.5)
nRBC: 0 % (ref 0.0–0.2)

## 2021-03-05 LAB — BLOOD GAS, ARTERIAL
Acid-Base Excess: 3.2 mmol/L — ABNORMAL HIGH (ref 0.0–2.0)
Bicarbonate: 26.9 mmol/L (ref 20.0–28.0)
Drawn by: 164
FIO2: 32
O2 Saturation: 83.2 %
Patient temperature: 37
pCO2 arterial: 38.8 mmHg (ref 32.0–48.0)
pH, Arterial: 7.456 — ABNORMAL HIGH (ref 7.350–7.450)
pO2, Arterial: 45.8 mmHg — ABNORMAL LOW (ref 83.0–108.0)

## 2021-03-05 LAB — COMPREHENSIVE METABOLIC PANEL
ALT: 14 U/L (ref 0–44)
AST: 22 U/L (ref 15–41)
Albumin: 3.5 g/dL (ref 3.5–5.0)
Alkaline Phosphatase: 123 U/L (ref 38–126)
Anion gap: 11 (ref 5–15)
BUN: 16 mg/dL (ref 8–23)
CO2: 28 mmol/L (ref 22–32)
Calcium: 9.2 mg/dL (ref 8.9–10.3)
Chloride: 101 mmol/L (ref 98–111)
Creatinine, Ser: 0.99 mg/dL (ref 0.61–1.24)
GFR, Estimated: >60 mL/min (ref >60)
Glucose, Bld: 192 mg/dL — ABNORMAL HIGH (ref 70–99)
Potassium: 5.1 mmol/L (ref 3.5–5.1)
Sodium: 140 mmol/L (ref 135–145)
Total Bilirubin: 1.1 mg/dL (ref 0.3–1.2)
Total Protein: 5.7 g/dL — ABNORMAL LOW (ref 6.5–8.1)

## 2021-03-05 LAB — HIV ANTIBODY (ROUTINE TESTING W REFLEX): HIV Screen 4th Generation wRfx: NONREACTIVE

## 2021-03-05 MED ORDER — ALBUTEROL SULFATE (2.5 MG/3ML) 0.083% IN NEBU
INHALATION_SOLUTION | RESPIRATORY_TRACT | Status: AC
  Administered 2021-03-05: 2.5 mg via RESPIRATORY_TRACT
  Filled 2021-03-05: qty 3

## 2021-03-05 MED ORDER — ALBUTEROL SULFATE (2.5 MG/3ML) 0.083% IN NEBU
2.5000 mg | INHALATION_SOLUTION | RESPIRATORY_TRACT | Status: DC | PRN
  Administered 2021-03-05 – 2021-03-08 (×3): 2.5 mg via RESPIRATORY_TRACT
  Filled 2021-03-05 (×2): qty 3

## 2021-03-05 MED ORDER — MOMETASONE FURO-FORMOTEROL FUM 100-5 MCG/ACT IN AERO
2.0000 | INHALATION_SPRAY | Freq: Two times a day (BID) | RESPIRATORY_TRACT | Status: DC
  Administered 2021-03-07 – 2021-03-08 (×3): 2 via RESPIRATORY_TRACT
  Filled 2021-03-05 (×2): qty 8.8

## 2021-03-05 MED ORDER — FUROSEMIDE 10 MG/ML IJ SOLN
60.0000 mg | Freq: Once | INTRAMUSCULAR | Status: AC
  Administered 2021-03-05: 60 mg via INTRAVENOUS
  Filled 2021-03-05: qty 6

## 2021-03-05 MED ORDER — ORAL CARE MOUTH RINSE
15.0000 mL | Freq: Two times a day (BID) | OROMUCOSAL | Status: DC
  Administered 2021-03-05 – 2021-03-06 (×3): 15 mL via OROMUCOSAL

## 2021-03-05 MED ORDER — ENOXAPARIN SODIUM 40 MG/0.4ML ~~LOC~~ SOLN
40.0000 mg | Freq: Every day | SUBCUTANEOUS | Status: DC
  Administered 2021-03-05 – 2021-03-08 (×4): 40 mg via SUBCUTANEOUS
  Filled 2021-03-05 (×4): qty 0.4

## 2021-03-05 MED ORDER — CHLORHEXIDINE GLUCONATE 0.12 % MT SOLN
15.0000 mL | Freq: Two times a day (BID) | OROMUCOSAL | Status: DC
  Administered 2021-03-06 – 2021-03-07 (×2): 15 mL via OROMUCOSAL
  Filled 2021-03-05 (×6): qty 15

## 2021-03-05 MED ORDER — METHYLPREDNISOLONE SODIUM SUCC 125 MG IJ SOLR
60.0000 mg | Freq: Four times a day (QID) | INTRAMUSCULAR | Status: DC
  Administered 2021-03-05 – 2021-03-07 (×7): 60 mg via INTRAVENOUS
  Filled 2021-03-05 (×7): qty 2

## 2021-03-05 MED ORDER — METOPROLOL TARTRATE 25 MG PO TABS: 25.0000 mg | ORAL_TABLET | Freq: Two times a day (BID) | ORAL | Status: DC

## 2021-03-05 MED ORDER — LEVOFLOXACIN IN D5W 750 MG/150ML IV SOLN
750.0000 mg | Freq: Every day | INTRAVENOUS | Status: DC
  Administered 2021-03-05 (×2): 750 mg via INTRAVENOUS
  Filled 2021-03-05 (×2): qty 150

## 2021-03-05 MED ORDER — SODIUM CHLORIDE 0.9 % IV SOLN: INTRAVENOUS | Status: DC

## 2021-03-05 MED ORDER — METOPROLOL TARTRATE 25 MG PO TABS
25.0000 mg | ORAL_TABLET | Freq: Two times a day (BID) | ORAL | Status: DC
  Administered 2021-03-05 – 2021-03-08 (×7): 25 mg via ORAL
  Filled 2021-03-05 (×7): qty 1

## 2021-03-05 NOTE — Progress Notes (Signed)
Patient ID: Isaac [name], male   DOB: [birth year], 65 y.o.   MRN: [id number]  PROGRESS NOTE    Isaac [name]  EVO:[id number] DOB: 08-18-1956 DOA: 03/04/2021 PCP: Jani Gravel, MD   Brief Narrative:  65 y.o. male with medical history significant of advanced COPD on home O2 usually at 3 L/min, CLL, anxiety disorder, morbid obesity, hypertension, hyperlipidemia, tobacco dependence and GERD, obstructive sleep apnea, prior history of intubation/tracheostomy during last admission presented with worsening shortness of breath and cough.  He was found to be hypoxic with oxygen saturations of 82% on his oxygen by EMS.  He was put on BiPAP.  ABG showed pH of 7.298, PCO2 of 69.80 and PO2 of 165.  Chest x-ray showed cardiomegaly and changes of COPD with no acute findings.  CT angiogram showed no pulmonary embolus but showed groundglass opacity in the right lower lobe and right middle lobe with extensive lymphadenopathy consistent with his CLL he was admitted for multilobar pneumonia and COPD exacerbation.  Assessment & Plan:   Acute on chronic hypoxic/hypercapnic respiratory failure COPD exacerbation Multilobar pneumonia History of tobacco dependence -Patient normally wears 3 L oxygen at home via nasal cannula.  He was hypercapnic on presentation requiring BiPAP.  Currently on 4 L oxygen via nasal cannula.  Repeat ABG. -CT angiogram showed no pulmonary embolus but showed groundglass opacity in the right lower lobe and right middle lobe with extensive lymphadenopathy consistent with his CLL -COVID-19 test was negative on presentation -Continue broad-spectrum antibiotics.  Decrease Solu-Medrol to 60 mg IV every 6 hours.  Continue nebs.  Add Dulera.  If respiratory status does not improve, might need pulmonary evaluation inpatient otherwise outpatient pulmonary follow-up -Follow cultures  CLL with chronic leukocytosis -Outpatient follow-up with pulmonary  Obesity -Outpatient follow-up  Essential  hypertension -Monitor blood pressure.  Continue metoprolol.  Hyperkalemia -Resolved  Generalized deconditioning -PT evaluation once respite status improved slightly. -If condition does not improve, might need palliative care consultation.   DVT prophylaxis: Lovenox Code Status: Full Family Communication: None at bedsid3 Disposition Plan: Status is: Inpatient  Remains inpatient appropriate because:Inpatient level of care appropriate due to severity of illness   Dispo: The patient is from: Home              Anticipated d/c is to: Home              Patient currently is not medically stable to d/c.   Difficult to place patient No  Consultants: None  Procedures: None  Antimicrobials:  Anti-infectives (From admission, onward)   Start     Dose/Rate Route Frequency Ordered Stop   03/05/21 2000  vancomycin (VANCOCIN) 2,250 mg in sodium chloride 0.9 % 500 mL IVPB        2,250 mg 250 mL/hr over 120 Minutes Intravenous Every 24 hours 03/04/21 1942     03/05/21 0400  ceFEPIme (MAXIPIME) 2 g in sodium chloride 0.9 % 100 mL IVPB        2 g 200 mL/hr over 30 Minutes Intravenous Every 8 hours 03/04/21 1854     03/05/21 0100  levofloxacin (LEVAQUIN) IVPB 750 mg        750 mg 100 mL/hr over 90 Minutes Intravenous Daily at bedtime 03/05/21 0005     03/04/21 1915  vancomycin (VANCOREADY) IVPB 2000 mg/400 mL        2,000 mg 200 mL/hr over 120 Minutes Intravenous  Once 03/04/21 1851 03/04/21 2335   03/04/21 1900  ceFEPIme (MAXIPIME) 2 g in  sodium chloride 0.9 % 100 mL IVPB        2 g 200 mL/hr over 30 Minutes Intravenous STAT 03/04/21 1854 03/04/21 2040       Subjective: Patient seen and examined at bedside.  Poor historian.  Does not feel well.  Feels short of breath with even minimal exertion.  No fever or vomiting reported.  Denies current chest pain.  Objective: Vitals:   03/04/21 2320 03/05/21 0516 03/05/21 0755 03/05/21 0955  BP: 133/77 (!) 151/68 (!) 166/87   Pulse: 84 100  (!) 101 100  Resp: 20 18 19    Temp: 98 F (36.7 C) 98.1 F (36.7 C) (!) 97.4 F (36.3 C)   TempSrc: Axillary Oral Oral   SpO2: 94% 95% 93%   Weight:      Height:        Intake/Output Summary (Last 24 hours) at 03/05/2021 1034 Last data filed at 03/05/2021 0559 Gross per 24 hour  Intake 668.34 ml  Output 200 ml  Net 468.34 ml   Filed Weights   03/04/21 1607 03/04/21 2224  Weight: 120 kg 97.2 kg    Examination:  General exam: Appears chronically ill and deconditioned.  Currently on 4 L oxygen via nasal cannula Respiratory system: Bilateral decreased breath sounds at bases with scattered crackles and intermittently tachypneic Cardiovascular system: S1 & S2 heard, intermittently tachycardic Gastrointestinal system: Abdomen is slightly obese, nondistended, soft and nontender. Normal bowel sounds heard. Extremities: No cyanosis, clubbing; lower extremity edema present Central nervous system: Alert.  Extremely slow to respond.  No focal neurological deficits. Moving extremities Skin: No rashes, lesions or ulcers Psychiatry: Flat affect; looks intermittently anxious    Data Reviewed: I have personally reviewed following labs and imaging studies  CBC: Recent Labs  Lab 03/04/21 1554 03/04/21 1609 03/04/21 1610 03/05/21 0150  WBC 130.4*  --   --  124.3*  NEUTROABS 4.2  --   --  4.4  HGB 12.2* 13.3 13.9 11.7*  HCT 41.4 39.0 41.0 38.8*  MCV 95.0  --   --  90.9  PLT 235  --   --  415   Basic Metabolic Panel: Recent Labs  Lab 03/04/21 1554 03/04/21 1609 03/04/21 1610 03/05/21 0150  NA 139 140 138 140  K 5.3* 4.2 4.8 5.1  CL 102  --  100 101  CO2 31  --   --  28  GLUCOSE 116*  --  110* 192*  BUN 10  --  12 16  CREATININE 1.01  --  0.90 0.99  CALCIUM 9.1  --   --  9.2   GFR: Estimated Creatinine Clearance: 87 mL/min (by C-G formula based on SCr of 0.99 mg/dL). Liver Function Tests: Recent Labs  Lab 03/04/21 1554 03/05/21 0150  AST 28 22  ALT 12 14  ALKPHOS  128* 123  BILITOT 1.0 1.1  PROT 6.1* 5.7*  ALBUMIN 3.7 3.5   No results for input(s): LIPASE, AMYLASE in the last 168 hours. No results for input(s): AMMONIA in the last 168 hours. Coagulation Profile: No results for input(s): INR, PROTIME in the last 168 hours. Cardiac Enzymes: No results for input(s): CKTOTAL, CKMB, CKMBINDEX, TROPONINI in the last 168 hours. BNP (last 3 results) No results for input(s): PROBNP in the last 8760 hours. HbA1C: No results for input(s): HGBA1C in the last 72 hours. CBG: Recent Labs  Lab 03/04/21 2222  GLUCAP 195*   Lipid Profile: No results for input(s): CHOL, HDL, LDLCALC, TRIG, CHOLHDL, LDLDIRECT in  the last 72 hours. Thyroid Function Tests: No results for input(s): TSH, T4TOTAL, FREET4, T3FREE, THYROIDAB in the last 72 hours. Anemia Panel: No results for input(s): VITAMINB12, FOLATE, FERRITIN, TIBC, IRON, RETICCTPCT in the last 72 hours. Sepsis Labs: Recent Labs  Lab 03/04/21 2030 03/04/21 2306  LATICACIDVEN 2.0* 2.3*    Recent Results (from the past 240 hour(s))  SARS CORONAVIRUS 2 (TAT 6-24 HRS) Nasopharyngeal Nasopharyngeal Swab     Status: None   Collection Time: 03/04/21  8:12 PM   Specimen: Nasopharyngeal Swab  Result Value Ref Range Status   SARS Coronavirus 2 NEGATIVE NEGATIVE Final    Comment: (NOTE) SARS-CoV-2 target nucleic acids are NOT DETECTED.  The SARS-CoV-2 RNA is generally detectable in upper and lower respiratory specimens during the acute phase of infection. Negative results do not preclude SARS-CoV-2 infection, do not rule out co-infections with other pathogens, and should not be used as the sole basis for treatment or other patient management decisions. Negative results must be combined with clinical observations, patient history, and epidemiological information. The expected result is Negative.  Fact Sheet for Patients: SugarRoll.be  Fact Sheet for Healthcare  Providers: https://www.woods-mathews.com/  This test is not yet approved or cleared by the [place] FDA and  has been authorized for detection and/or diagnosis of SARS-CoV-2 by FDA under an Emergency Use Authorization (EUA). This EUA will remain  in effect (meaning this test can be used) for the duration of the COVID-19 declaration under Se ction 564(b)(1) of the Act, 21 U.S.C. section 360bbb-3(b)(1), unless the authorization is terminated or revoked sooner.  Performed at [facility], [place] [address], [place], [place] [address]          Radiology Studies: CT Angio Chest PE W and/or Wo Contrast  Result Date: 03/04/2021 CLINICAL DATA:  65 year old who currently uses BiPAP for sleep apnea, presenting with acute onset of shortness of breath and hypoxia. Personal history of chronic lymphocytic leukemia. EXAM: CT ANGIOGRAPHY CHEST WITH CONTRAST TECHNIQUE: Multidetector CT imaging of the chest was performed using the standard protocol during bolus administration of intravenous contrast. Multiplanar CT image reconstructions and MIPs were obtained to evaluate the vascular anatomy. CONTRAST:  171mL OMNIPAQUE IOHEXOL 350 MG/ML IV. COMPARISON:  None. FINDINGS: Cardiovascular: Contrast opacification of pulmonary arteries is very good. No filling defects within either main pulmonary artery or their segmental branches in either lung to suggest pulmonary embolism. Heart mildly to moderately enlarged. Moderate to severe three-vessel coronary atherosclerosis. No pericardial effusion. Mild atherosclerosis involving the thoracic aorta without evidence of aneurysm. Proximal great vessels widely patent. Mediastinum/Nodes: Extensive lymphadenopathy throughout the visualized lower cervical chains in the neck bilaterally, both axilla, throughout the mediastinum and both hila and in the retrocrural space. An index largest RIGHT axillary lymph node measures approximately 4.8 x 3.6 cm (5/36). An  index largest LEFT axillary node measures approximately 4.0 x 3.0 cm (5/23). Index conglomerate subcarinal mediastinal lymphadenopathy measures approximately 5.0 x 2.2 cm (5/80). Normal appearing esophagus.  Normal-appearing thyroid gland. Lungs/Pleura: Emphysematous changes throughout both lungs. Ground-glass airspace opacities involving the upper lobes bilaterally (RIGHT greater than LEFT), the RIGHT LOWER LOBE, and the RIGHT MIDDLE LOBE. Pleural thickening involving the major fissure on the LEFT. No pleural effusions. Central airways patent. Upper Abdomen: Benign cyst involving the LEFT hepatic lobe measuring approximately 5.5 x 5.8 cm (5/140). Massive splenic enlargement (the spleen is incompletely imaged). Numerous enlarged lymph nodes in the upper abdomen in the gastrohepatic ligament, the portacaval region, the aortocaval region and  the periaortic region. Musculoskeletal: Mild degenerative changes involving the thoracic spine. No acute findings. Review of the MIP images confirms the above findings. IMPRESSION: 1. No evidence of pulmonary embolism. 2. Ground-glass airspace opacities involving the upper lobes bilaterally, the RIGHT LOWER LOBE, and the RIGHT MIDDLE LOBE, likely indicating pneumonia. 3. Extensive lymphadenopathy throughout the visualized lower cervical chains bilaterally, both axilla, the mediastinum, both hila, the retrocrural space and the upper abdomen. Index lymph nodes are measured above. This likely indicates recurrence of the patient's CLL or transformation into lymphoma. 4. Massive splenic enlargement (the spleen is incompletely imaged). 5. Aortic Atherosclerosis (ICD10-I70.0) and Emphysema (ICD10-J43.9). Electronically Signed   By: Evangeline Dakin M.D.   On: 03/04/2021 18:41   DG Chest Port 1 View  Result Date: 03/04/2021 CLINICAL DATA:  Shortness of breath.  Respiratory distress. EXAM: PORTABLE CHEST 1 VIEW COMPARISON:  12/19/2020 FINDINGS: Stable enlarged cardiac silhouette. The  central pulmonary arteries are prominent. The lungs are hyperexpanded with diffuse prominence of the interstitial markings, bullous changes on the left and normal vasculature. Diffuse osteopenia. IMPRESSION: Stable cardiomegaly and changes of COPD. No acute abnormality. Electronically Signed   By: Claudie Revering M.D.   On: 03/04/2021 16:09        Scheduled Meds: . chlorhexidine  15 mL Mouth Rinse BID  . enoxaparin (LOVENOX) injection  40 mg Subcutaneous Daily  . ipratropium-albuterol  3 mL Nebulization QID  . mouth rinse  15 mL Mouth Rinse q12n4p  . methylPREDNISolone (SOLU-MEDROL) injection  125 mg Intravenous Q6H  . metoprolol tartrate  25 mg Oral BID   Continuous Infusions: . ceFEPime (MAXIPIME) IV 2 g (03/05/21 0540)  . levofloxacin (LEVAQUIN) IV 750 mg (03/05/21 0046)  . vancomycin            Aline August, MD [facility] 03/05/2021, 10:34 AM

## 2021-03-05 NOTE — Progress Notes (Signed)
ABG sample obtained x1 attempt and sent to lab. Upon arrival back to pt room, pt with complaints of shortness of breath. PRN albuterol inhaler given x2 puffs. SpO2 was 79% at this time. Pt placed on bipap  and SpO2 rose to 96%. RN made aware. RT will continue to monitor and be available as needed.

## 2021-03-06 DIAGNOSIS — J189 Pneumonia, unspecified organism: Principal | ICD-10-CM

## 2021-03-06 LAB — COMPREHENSIVE METABOLIC PANEL
ALT: 13 U/L (ref 0–44)
AST: 37 U/L (ref 15–41)
Albumin: 3.3 g/dL — ABNORMAL LOW (ref 3.5–5.0)
Alkaline Phosphatase: 112 U/L (ref 38–126)
Anion gap: 14 (ref 5–15)
BUN: 32 mg/dL — ABNORMAL HIGH (ref 8–23)
CO2: 27 mmol/L (ref 22–32)
Calcium: 8.6 mg/dL — ABNORMAL LOW (ref 8.9–10.3)
Chloride: 98 mmol/L (ref 98–111)
Creatinine, Ser: 1.08 mg/dL (ref 0.61–1.24)
GFR, Estimated: >60 mL/min (ref >60)
Glucose, Bld: 168 mg/dL — ABNORMAL HIGH (ref 70–99)
Potassium: 5.2 mmol/L — ABNORMAL HIGH (ref 3.5–5.1)
Sodium: 139 mmol/L (ref 135–145)
Total Bilirubin: 1.1 mg/dL (ref 0.3–1.2)
Total Protein: 5.6 g/dL — ABNORMAL LOW (ref 6.5–8.1)

## 2021-03-06 LAB — BLOOD GAS, ARTERIAL
Acid-Base Excess: 4.6 mmol/L — ABNORMAL HIGH (ref 0.0–2.0)
Acid-base deficit: 28 mmol/L — ABNORMAL HIGH (ref 0.0–2.0)
Bicarbonate: 28 mmol/L (ref 20.0–28.0)
Drawn by: 42783
FIO2: 40
O2 Saturation: 96.3 %
Patient temperature: 37
pCO2 arterial: 48.8 mmHg — ABNORMAL HIGH (ref 32.0–48.0)
pH, Arterial: 7.395 (ref 7.350–7.450)
pO2, Arterial: 85.8 mmHg (ref 83.0–108.0)

## 2021-03-06 LAB — CBC WITH DIFFERENTIAL/PLATELET
Abs Immature Granulocytes: 0.43 10*3/uL — ABNORMAL HIGH (ref 0.00–0.07)
Basophils Absolute: 0.5 10*3/uL — ABNORMAL HIGH (ref 0.0–0.1)
Basophils Relative: 0 %
Eosinophils Absolute: 0 10*3/uL (ref 0.0–0.5)
Eosinophils Relative: 0 %
HCT: 36.2 % — ABNORMAL LOW (ref 39.0–52.0)
Hemoglobin: 10.9 g/dL — ABNORMAL LOW (ref 13.0–17.0)
Immature Granulocytes: 0 %
Lymphocytes Relative: 74 %
Lymphs Abs: 100.6 10*3/uL — ABNORMAL HIGH (ref 0.7–4.0)
MCH: 27 pg (ref 26.0–34.0)
MCHC: 30.1 g/dL (ref 30.0–36.0)
MCV: 89.8 fL (ref 80.0–100.0)
Monocytes Absolute: 29.7 10*3/uL — ABNORMAL HIGH (ref 0.1–1.0)
Monocytes Relative: 21 %
Neutro Abs: 7.5 10*3/uL (ref 1.7–7.7)
Neutrophils Relative %: 5 %
Platelets: 215 10*3/uL (ref 150–400)
RBC: 4.03 MIL/uL — ABNORMAL LOW (ref 4.22–5.81)
RDW: 14.6 % (ref 11.5–15.5)
WBC: 138.6 10*3/uL (ref 4.0–10.5)
nRBC: 0 % (ref 0.0–0.2)

## 2021-03-06 LAB — MAGNESIUM: Magnesium: 2.5 mg/dL — ABNORMAL HIGH (ref 1.7–2.4)

## 2021-03-06 LAB — PATHOLOGIST SMEAR REVIEW

## 2021-03-06 MED ORDER — FUROSEMIDE 10 MG/ML IJ SOLN
60.0000 mg | Freq: Once | INTRAMUSCULAR | Status: AC
  Administered 2021-03-06: 60 mg via INTRAVENOUS
  Filled 2021-03-06: qty 6

## 2021-03-06 MED ORDER — ACETAMINOPHEN 325 MG PO TABS
650.0000 mg | ORAL_TABLET | Freq: Four times a day (QID) | ORAL | Status: DC | PRN
  Administered 2021-03-06 – 2021-03-07 (×3): 650 mg via ORAL
  Filled 2021-03-06 (×3): qty 2

## 2021-03-06 NOTE — Plan of Care (Signed)
  Problem: Education: Goal: Knowledge of General Education information will improve Description Including pain rating scale, medication(s)/side effects and non-pharmacologic comfort measures Outcome: Progressing   Problem: Health Behavior/Discharge Planning: Goal: Ability to manage health-related needs will improve Outcome: Progressing   

## 2021-03-06 NOTE — Progress Notes (Signed)
SLP Cancellation Note  Patient Details Name: Isaac [name] MRN: [id number] DOB: [birth year]   Cancelled treatment:       Reason Eval/Treat Not Completed: Patient not medically ready (On BIPAP. SLP to f/u.)    [address], [place], [place] Office Number: [phone]  Acie Fredrickson 03/06/2021, 8:54 AM

## 2021-03-06 NOTE — Progress Notes (Signed)
Patient ID: Isaac [name], male   DOB: [birth year], 65 y.o.   MRN: [id number]  PROGRESS NOTE    Isaac [name]  XTG:[id number] DOB: 06-Dec-1955 DOA: 03/04/2021 PCP: Jani Gravel, MD   Brief Narrative:  65 y.o. male with medical history significant of advanced COPD on home O2 usually at 3 L/min, CLL, anxiety disorder, morbid obesity, hypertension, hyperlipidemia, tobacco dependence and GERD, obstructive sleep apnea, prior history of intubation/tracheostomy during last admission presented with worsening shortness of breath and cough.  He was found to be hypoxic with oxygen saturations of 82% on his oxygen by EMS.  He was put on BiPAP.  ABG showed pH of 7.298, PCO2 of 69.80 and PO2 of 165.  Chest x-ray showed cardiomegaly and changes of COPD with no acute findings.  CT angiogram showed no pulmonary embolus but showed groundglass opacity in the right lower lobe and right middle lobe with extensive lymphadenopathy consistent with his CLL he was admitted for multilobar pneumonia and COPD exacerbation.  Assessment & Plan:   Acute on chronic hypoxic/hypercapnic respiratory failure COPD exacerbation Multilobar pneumonia History of tobacco dependence -Patient normally wears 3 L oxygen at home via nasal cannula.  He was hypercapnic on presentation requiring BiPAP.  Was on 6 L oxygen via nasal cannula overnight but was switched to BiPAP again overnight.  Will repeat ABG and try and switch to nasal cannula oxygen if possible. -CT angiogram showed no pulmonary embolus but showed groundglass opacity in the right lower lobe and right middle lobe with extensive lymphadenopathy consistent with his CLL -COVID-19 test was negative on presentation -Continue broad-spectrum antibiotics.  Continue Solu-Medrol 60 mg IV every 6 hours.  Continue nebs and Dulera.  If respiratory status does not improve, might need pulmonary evaluation inpatient otherwise outpatient pulmonary follow-up -Cultures negative so far -Patient  received intravenous Lasix on 03/05/2021; will continue IV Lasix today as well.  CLL with chronic leukocytosis -Outpatient follow-up with pulmonary  Obesity -Outpatient follow-up  Essential hypertension -Monitor blood pressure.  Continue metoprolol.  Hyperkalemia -Mild.  Monitor  Generalized deconditioning -PT evaluation once respiratory status improved slightly. -Consult palliative care for goals of care discussion  DVT prophylaxis: Lovenox Code Status: Full Family Communication: Wife at bedside on 03/06/2021  disposition Plan: Status is: Inpatient  Remains inpatient appropriate because:Inpatient level of care appropriate due to severity of illness   Dispo: The patient is from: Home              Anticipated d/c is to: Home              Patient currently is not medically stable to d/c.   Difficult to place patient No  Consultants: None  Procedures: None  Antimicrobials:  Anti-infectives (From admission, onward)   Start     Dose/Rate Route Frequency Ordered Stop   03/05/21 2000  vancomycin (VANCOCIN) 2,250 mg in sodium chloride 0.9 % 500 mL IVPB        2,250 mg 250 mL/hr over 120 Minutes Intravenous Every 24 hours 03/04/21 1942     03/05/21 0400  ceFEPIme (MAXIPIME) 2 g in sodium chloride 0.9 % 100 mL IVPB        2 g 200 mL/hr over 30 Minutes Intravenous Every 8 hours 03/04/21 1854     03/05/21 0100  levofloxacin (LEVAQUIN) IVPB 750 mg        750 mg 100 mL/hr over 90 Minutes Intravenous Daily at bedtime 03/05/21 0005     03/04/21 1915  vancomycin (VANCOREADY) IVPB  2000 mg/400 mL        2,000 mg 200 mL/hr over 120 Minutes Intravenous  Once 03/04/21 1851 03/04/21 2335   03/04/21 1900  ceFEPIme (MAXIPIME) 2 g in sodium chloride 0.9 % 100 mL IVPB        2 g 200 mL/hr over 30 Minutes Intravenous STAT 03/04/21 1854 03/04/21 2040       Subjective: Patient seen and examined at bedside.  Still feels very short of breath with minimal exertion.  No overnight fever,  vomiting, chest pain reported.  Objective: Vitals:   03/05/21 2300 03/06/21 0056 03/06/21 0342 03/06/21 0500  BP: (!) 167/89   (!) 151/97  Pulse: 82 87 81 82  Resp: 19 18 20 20   Temp: 98.1 F (36.7 C)   97.9 F (36.6 C)  TempSrc: Axillary   Axillary  SpO2: 99% 98% 98% 98%  Weight:      Height:        Intake/Output Summary (Last 24 hours) at 03/06/2021 0737 Last data filed at 03/05/2021 2311 Gross per 24 hour  Intake 744.76 ml  Output 1175 ml  Net -430.24 ml   Filed Weights   03/04/21 1607 03/04/21 2224  Weight: 120 kg 97.2 kg    Examination:  General exam: Appears chronically ill and deconditioned.  Currently on BiPAP  respiratory system: Decreased breath sounds at bases bilaterally with some crackles; intermittent tachypnea present Cardiovascular system: Currently rate controlled, S1-S2 heard Gastrointestinal system: Abdomen is slightly obese, slightly distended, soft and nontender.  Bowel sounds are heard Extremities: Mild lower extremity edema present bilaterally; no clubbing  Central nervous system: Awake but slow to respond still.  No focal neurological deficits.  Moves extremities Skin: No obvious ecchymosis/lesions Psychiatry: Intermittently anxious looking; affect is flat   Data Reviewed: I have personally reviewed following labs and imaging studies  CBC: Recent Labs  Lab 03/04/21 1554 03/04/21 1609 03/04/21 1610 03/05/21 0150 03/06/21 0148  WBC 130.4*  --   --  124.3* 138.6*  NEUTROABS 4.2  --   --  4.4 7.5  HGB 12.2* 13.3 13.9 11.7* 10.9*  HCT 41.4 39.0 41.0 38.8* 36.2*  MCV 95.0  --   --  90.9 89.8  PLT 235  --   --  229 431   Basic Metabolic Panel: Recent Labs  Lab 03/04/21 1554 03/04/21 1609 03/04/21 1610 03/05/21 0150 03/06/21 0148  NA 139 140 138 140 139  K 5.3* 4.2 4.8 5.1 5.2*  CL 102  --  100 101 98  CO2 31  --   --  28 27  GLUCOSE 116*  --  110* 192* 168*  BUN 10  --  12 16 32*  CREATININE 1.01  --  0.90 0.99 1.08  CALCIUM 9.1   --   --  9.2 8.6*  MG  --   --   --   --  2.5*   GFR: Estimated Creatinine Clearance: 79.8 mL/min (by C-G formula based on SCr of 1.08 mg/dL). Liver Function Tests: Recent Labs  Lab 03/04/21 1554 03/05/21 0150 03/06/21 0148  AST 28 22 37  ALT 12 14 13   ALKPHOS 128* 123 112  BILITOT 1.0 1.1 1.1  PROT 6.1* 5.7* 5.6*  ALBUMIN 3.7 3.5 3.3*   No results for input(s): LIPASE, AMYLASE in the last 168 hours. No results for input(s): AMMONIA in the last 168 hours. Coagulation Profile: No results for input(s): INR, PROTIME in the last 168 hours. Cardiac Enzymes: No results for input(s): CKTOTAL,  CKMB, CKMBINDEX, TROPONINI in the last 168 hours. BNP (last 3 results) No results for input(s): PROBNP in the last 8760 hours. HbA1C: No results for input(s): HGBA1C in the last 72 hours. CBG: Recent Labs  Lab 03/04/21 2222  GLUCAP 195*   Lipid Profile: No results for input(s): CHOL, HDL, LDLCALC, TRIG, CHOLHDL, LDLDIRECT in the last 72 hours. Thyroid Function Tests: No results for input(s): TSH, T4TOTAL, FREET4, T3FREE, THYROIDAB in the last 72 hours. Anemia Panel: No results for input(s): VITAMINB12, FOLATE, FERRITIN, TIBC, IRON, RETICCTPCT in the last 72 hours. Sepsis Labs: Recent Labs  Lab 03/04/21 2030 03/04/21 2306  LATICACIDVEN 2.0* 2.3*    Recent Results (from the past 240 hour(s))  Blood culture (routine x 2)     Status: None (Preliminary result)   Collection Time: 03/04/21  8:03 PM   Specimen: BLOOD RIGHT FOREARM  Result Value Ref Range Status   Specimen Description BLOOD RIGHT FOREARM  Final   Special Requests   Final    BOTTLES DRAWN AEROBIC AND ANAEROBIC Blood Culture adequate volume   Culture   Final    NO GROWTH < 24 HOURS Performed at [facility], [place] [address] [place], [place] [address]    Report Status PENDING  Incomplete  Blood culture (routine x 2)     Status: None (Preliminary result)   Collection Time: 03/04/21  8:03 PM   Specimen: BLOOD  RIGHT HAND  Result Value Ref Range Status   Specimen Description BLOOD RIGHT HAND  Final   Special Requests   Final    BOTTLES DRAWN AEROBIC AND ANAEROBIC Blood Culture adequate volume   Culture   Final    NO GROWTH < 24 HOURS Performed at [facility], [place] [address], [place], [place] [address]    Report Status PENDING  Incomplete  SARS CORONAVIRUS 2 (TAT 6-24 HRS) Nasopharyngeal Nasopharyngeal Swab     Status: None   Collection Time: 03/04/21  8:12 PM   Specimen: Nasopharyngeal Swab  Result Value Ref Range Status   SARS Coronavirus 2 NEGATIVE NEGATIVE Final    Comment: (NOTE) SARS-CoV-2 target nucleic acids are NOT DETECTED.  The SARS-CoV-2 RNA is generally detectable in upper and lower respiratory specimens during the acute phase of infection. Negative results do not preclude SARS-CoV-2 infection, do not rule out co-infections with other pathogens, and should not be used as the sole basis for treatment or other patient management decisions. Negative results must be combined with clinical observations, patient history, and epidemiological information. The expected result is Negative.  Fact Sheet for Patients: SugarRoll.be  Fact Sheet for Healthcare Providers: https://www.woods-mathews.com/  This test is not yet approved or cleared by the [place] FDA and  has been authorized for detection and/or diagnosis of SARS-CoV-2 by FDA under an Emergency Use Authorization (EUA). This EUA will remain  in effect (meaning this test can be used) for the duration of the COVID-19 declaration under Se ction 564(b)(1) of the Act, 21 U.S.C. section 360bbb-3(b)(1), unless the authorization is terminated or revoked sooner.  Performed at [facility], [place] [address] [place], [place] [address]          Radiology Studies: CT Angio Chest PE W and/or Wo Contrast  Result Date: 03/04/2021 CLINICAL DATA:  65 year old who currently  uses BiPAP for sleep apnea, presenting with acute onset of shortness of breath and hypoxia. Personal history of chronic lymphocytic leukemia. EXAM: CT ANGIOGRAPHY CHEST WITH CONTRAST TECHNIQUE: Multidetector CT imaging of the  chest was performed using the standard protocol during bolus administration of intravenous contrast. Multiplanar CT image reconstructions and MIPs were obtained to evaluate the vascular anatomy. CONTRAST:  12mL OMNIPAQUE IOHEXOL 350 MG/ML IV. COMPARISON:  None. FINDINGS: Cardiovascular: Contrast opacification of pulmonary arteries is very good. No filling defects within either main pulmonary artery or their segmental branches in either lung to suggest pulmonary embolism. Heart mildly to moderately enlarged. Moderate to severe three-vessel coronary atherosclerosis. No pericardial effusion. Mild atherosclerosis involving the thoracic aorta without evidence of aneurysm. Proximal great vessels widely patent. Mediastinum/Nodes: Extensive lymphadenopathy throughout the visualized lower cervical chains in the neck bilaterally, both axilla, throughout the mediastinum and both hila and in the retrocrural space. An index largest RIGHT axillary lymph node measures approximately 4.8 x 3.6 cm (5/36). An index largest LEFT axillary node measures approximately 4.0 x 3.0 cm (5/23). Index conglomerate subcarinal mediastinal lymphadenopathy measures approximately 5.0 x 2.2 cm (5/80). Normal appearing esophagus.  Normal-appearing thyroid gland. Lungs/Pleura: Emphysematous changes throughout both lungs. Ground-glass airspace opacities involving the upper lobes bilaterally (RIGHT greater than LEFT), the RIGHT LOWER LOBE, and the RIGHT MIDDLE LOBE. Pleural thickening involving the major fissure on the LEFT. No pleural effusions. Central airways patent. Upper Abdomen: Benign cyst involving the LEFT hepatic lobe measuring approximately 5.5 x 5.8 cm (5/140). Massive splenic enlargement (the spleen is incompletely  imaged). Numerous enlarged lymph nodes in the upper abdomen in the gastrohepatic ligament, the portacaval region, the aortocaval region and the periaortic region. Musculoskeletal: Mild degenerative changes involving the thoracic spine. No acute findings. Review of the MIP images confirms the above findings. IMPRESSION: 1. No evidence of pulmonary embolism. 2. Ground-glass airspace opacities involving the upper lobes bilaterally, the RIGHT LOWER LOBE, and the RIGHT MIDDLE LOBE, likely indicating pneumonia. 3. Extensive lymphadenopathy throughout the visualized lower cervical chains bilaterally, both axilla, the mediastinum, both hila, the retrocrural space and the upper abdomen. Index lymph nodes are measured above. This likely indicates recurrence of the patient's CLL or transformation into lymphoma. 4. Massive splenic enlargement (the spleen is incompletely imaged). 5. Aortic Atherosclerosis (ICD10-I70.0) and Emphysema (ICD10-J43.9). Electronically Signed   By: Evangeline Dakin M.D.   On: 03/04/2021 18:41   DG Chest Port 1 View  Result Date: 03/04/2021 CLINICAL DATA:  Shortness of breath.  Respiratory distress. EXAM: PORTABLE CHEST 1 VIEW COMPARISON:  12/19/2020 FINDINGS: Stable enlarged cardiac silhouette. The central pulmonary arteries are prominent. The lungs are hyperexpanded with diffuse prominence of the interstitial markings, bullous changes on the left and normal vasculature. Diffuse osteopenia. IMPRESSION: Stable cardiomegaly and changes of COPD. No acute abnormality. Electronically Signed   By: Claudie Revering M.D.   On: 03/04/2021 16:09        Scheduled Meds: . chlorhexidine  15 mL Mouth Rinse BID  . enoxaparin (LOVENOX) injection  40 mg Subcutaneous Daily  . ipratropium-albuterol  3 mL Nebulization QID  . mouth rinse  15 mL Mouth Rinse q12n4p  . methylPREDNISolone (SOLU-MEDROL) injection  60 mg Intravenous Q6H  . metoprolol tartrate  25 mg Oral BID  . mometasone-formoterol  2 puff  Inhalation BID   Continuous Infusions: . ceFEPime (MAXIPIME) IV 2 g (03/06/21 0426)  . levofloxacin (LEVAQUIN) IV 750 mg (03/05/21 2311)  . vancomycin 2,250 mg (03/05/21 2107)          Aline August, MD [facility] 03/06/2021, 7:37 AM

## 2021-03-06 NOTE — Evaluation (Signed)
Clinical/Bedside Swallow Evaluation Patient Details  Name: ABDULLOH [name] MRN: [id number] Date of Birth: December 27, 1955  Today's Date: 03/06/2021 Time: SLP Start Time (ACUTE ONLY): 1120 SLP Stop Time (ACUTE ONLY): 1135 SLP Time Calculation (min) (ACUTE ONLY): 15.62 min  Past Medical History:  Past Medical History:  Diagnosis Date  . Acute on chronic respiratory failure with hypoxia ([place])   . Anxiety   . Chronic lymphocytic leukemia in remission ([place])   . COPD (chronic obstructive pulmonary disease) ([place])   . COPD, severe ([place])   . GERD (gastroesophageal reflux disease)   . Hematuria   . Hyperlipidemia   . Hypertension   . Insomnia   . Left lower lobe pneumonia   . Nephrolithiasis   . Tobacco dependence    Past Surgical History:  Past Surgical History:  Procedure Laterality Date  . LITHOTRIPSY     HPI:  Pt is a 65 y.o. male with medical history significant of advanced COPD on home O2 usually at 3 L/min, anxiety disorder, morbid obesity, hypertension, hyperlipidemia, tobacco dependence and GERD, obstructive sleep apnea. Pt had intubation and subsequent trach during admission in January, 2022 with decannulation on 01/08/21; pt was not seen by SLP during the acute admission but he was subsequently discharged to [facility]. Images from [facility] at [facility] on 01/03/21 revealed penetration (PAS 3) of nectar thick and thin liquids secondary to pharyngeal delay; recommendations thereafter are unknown. Pt presented to the ED with sudden onset of shortness of breath, cough and significant hypoxemia.  CT chest 4/9: Ground-glass airspace opacities involving the upper lobes bilaterally, the R lower lobe, and the right middle lobe, likely indicating pneumonia.   Assessment / Plan / Recommendation Clinical Impression  Pt was seen for bedside swallow evaluation. He agreed that he was once on thickened liquids, but stated that he has been consuming soft solids and thin liquids at home without overt s/sx of  aspiration. Pt's wife was contacted and she reported that she has been grinding and pureeing the pt's food prior to admission, but that he was advanced to thin liquids while at the SNF. Pt's wife reported that the pt has been eating laying down since discharge from hospital in February and is resistance to being seated upright. Oral mechanism exam was [facility]. Pt was edentulous and he reported that he does not wear dentures. The evaluation was conducted with the pt in a reclined position (i.e., 25 degrees) since he stated that it was too painful to be more upright. No s/sx of aspiration were noted with solids or liquids, but trials were limited due to pt's acceptance. Due to pt's history of dysphagia and the results of imaging, an instrumental assessment is recommended at this time to further assess swallow function. Considering pt's resistance/inability to be seated upright, it is unlikely that the pt will be able to participate in a modified barium swallow study. A fiberoptic endoscopic evaluation of swallowing is therefore recommended and it will be scheduled for 03/07/21 at 1100. Pt's wife is in agreement with this plan and will sign consent on 4/12. SLP Visit Diagnosis: Dysphagia, unspecified (R13.10)    Aspiration Risk  Moderate aspiration risk;Mild aspiration risk    Diet Recommendation Dysphagia 2 (Fine chop);Thin liquid   Liquid Administration via: Cup;Straw Medication Administration: Whole meds with puree Supervision: Staff to assist with self feeding Compensations: Slow rate;Small sips/bites Postural Changes: Seated upright at 90 degrees    Other  Recommendations Oral Care Recommendations: Oral care BID   Follow up Recommendations  (  TBD)      Frequency and Duration min 2x/week  1 week       Prognosis Prognosis for Safe Diet Advancement: Fair Barriers to Reach Goals: Time post onset      Swallow Study   General Date of Onset: 03/06/21 HPI: Pt is a 65 y.o. male with medical history  significant of advanced COPD on home O2 usually at 3 L/min, anxiety disorder, morbid obesity, hypertension, hyperlipidemia, tobacco dependence and GERD, obstructive sleep apnea. Pt had intubation and subsequent trach during admission in January, 2022 with decannulation on 01/08/21; pt was not seen by SLP during the acute admission but he was subsequently discharged to [facility]. Images from [facility] at [facility] on 01/03/21 revealed penetration (PAS 3) of nectar thick and thin liquids secondary to pharyngeal delay; recommendations thereafter are unknown. Pt presented to the ED with sudden onset of shortness of breath, cough and significant hypoxemia.  CT chest 4/9: Ground-glass airspace opacities involving the upper lobes bilaterally, the R lower lobe, and the right middle lobe, likely indicating pneumonia. Type of Study: Bedside Swallow Evaluation Previous Swallow Assessment: See HPI Diet Prior to this Study: Regular;Thin liquids Temperature Spikes Noted: No Respiratory Status: Nasal cannula History of Recent Intubation: No Behavior/Cognition: Alert;Cooperative;Pleasant mood Oral Cavity Assessment: Within Functional Limits Oral Care Completed by SLP: No Oral Cavity - Dentition: Edentulous Vision: Functional for self-feeding Self-Feeding Abilities: Needs assist Patient Positioning: Upright in bed;Postural control adequate for testing Baseline Vocal Quality: Normal Volitional Swallow: Able to elicit    Oral/Motor/Sensory Function Overall Oral Motor/Sensory Function: Within functional limits   Ice Chips Ice chips: Not tested   Thin Liquid Thin Liquid: Within functional limits Presentation: Straw    Nectar Thick Nectar Thick Liquid: Not tested   Honey Thick Honey Thick Liquid: Not tested   Puree Puree: Within functional limits Presentation: Spoon   Solid     Solid: Within functional limits (dysphagia 2)     Herman Mell I. Hardin Negus, [place], [place] Office number  [phone] Pager [place] 03/06/2021,2:17 PM

## 2021-03-06 NOTE — Plan of Care (Signed)
  Problem: Education: Goal: Knowledge of General Education information will improve Description: Including pain rating scale, medication(s)/side effects and non-pharmacologic comfort measures Outcome: Progressing   Problem: Clinical Measurements: Goal: Respiratory complications will improve Outcome: Progressing   Problem: Clinical Measurements: Goal: Cardiovascular complication will be avoided Outcome: Progressing   Problem: Coping: Goal: Level of anxiety will decrease Outcome: Progressing

## 2021-03-06 NOTE — Plan of Care (Signed)
  Problem: Safety: Goal: Ability to remain free from injury will improve Outcome: Progressing   Problem: Skin Integrity: Goal: Risk for impaired skin integrity will decrease Outcome: Progressing   

## 2021-03-07 DIAGNOSIS — E875 Hyperkalemia: Secondary | ICD-10-CM

## 2021-03-07 LAB — BASIC METABOLIC PANEL
Anion gap: 8 (ref 5–15)
Anion gap: 12 (ref 5–15)
BUN: 43 mg/dL — ABNORMAL HIGH (ref 8–23)
BUN: 44 mg/dL — ABNORMAL HIGH (ref 8–23)
CO2: 34 mmol/L — ABNORMAL HIGH (ref 22–32)
CO2: 35 mmol/L — ABNORMAL HIGH (ref 22–32)
Calcium: 8.7 mg/dL — ABNORMAL LOW (ref 8.9–10.3)
Calcium: 8.9 mg/dL (ref 8.9–10.3)
Chloride: 97 mmol/L — ABNORMAL LOW (ref 98–111)
Chloride: 94 mmol/L — ABNORMAL LOW (ref 98–111)
Creatinine, Ser: 1.22 mg/dL (ref 0.61–1.24)
Creatinine, Ser: 1.2 mg/dL (ref 0.61–1.24)
GFR, Estimated: >60 mL/min (ref >60)
GFR, Estimated: >60 mL/min (ref >60)
Glucose, Bld: 198 mg/dL — ABNORMAL HIGH (ref 70–99)
Glucose, Bld: 218 mg/dL — ABNORMAL HIGH (ref 70–99)
Potassium: 6.1 mmol/L — ABNORMAL HIGH (ref 3.5–5.1)
Potassium: 4.4 mmol/L (ref 3.5–5.1)
Sodium: 139 mmol/L (ref 135–145)
Sodium: 141 mmol/L (ref 135–145)

## 2021-03-07 LAB — MRSA PCR SCREENING: MRSA by PCR: NEGATIVE

## 2021-03-07 LAB — CBC WITH DIFFERENTIAL/PLATELET
Abs Immature Granulocytes: 0.3 10*3/uL — ABNORMAL HIGH (ref 0.00–0.07)
Basophils Absolute: 0.5 10*3/uL — ABNORMAL HIGH (ref 0.0–0.1)
Basophils Relative: 0 %
Eosinophils Absolute: 0 10*3/uL (ref 0.0–0.5)
Eosinophils Relative: 0 %
HCT: 38.3 % — ABNORMAL LOW (ref 39.0–52.0)
Hemoglobin: 11.7 g/dL — ABNORMAL LOW (ref 13.0–17.0)
Immature Granulocytes: 0 %
Lymphocytes Relative: 79 %
Lymphs Abs: 111.8 10*3/uL — ABNORMAL HIGH (ref 0.7–4.0)
MCH: 27.6 pg (ref 26.0–34.0)
MCHC: 30.5 g/dL (ref 30.0–36.0)
MCV: 90.3 fL (ref 80.0–100.0)
Monocytes Absolute: 22 10*3/uL — ABNORMAL HIGH (ref 0.1–1.0)
Monocytes Relative: 16 %
Neutro Abs: 6.5 10*3/uL (ref 1.7–7.7)
Neutrophils Relative %: 5 %
Platelets: 223 10*3/uL (ref 150–400)
RBC: 4.24 MIL/uL (ref 4.22–5.81)
RDW: 14.7 % (ref 11.5–15.5)
WBC: 141 10*3/uL (ref 4.0–10.5)
nRBC: 0 % (ref 0.0–0.2)

## 2021-03-07 LAB — BLOOD GAS, ARTERIAL
Acid-Base Excess: 7.7 mmol/L — ABNORMAL HIGH (ref 0.0–2.0)
Bicarbonate: 32.3 mmol/L — ABNORMAL HIGH (ref 20.0–28.0)
FIO2: 40
O2 Saturation: 95.4 %
Patient temperature: 36.7
pCO2 arterial: 49.9 mmHg — ABNORMAL HIGH (ref 32.0–48.0)
pH, Arterial: 7.425 (ref 7.350–7.450)
pO2, Arterial: 76.9 mmHg — ABNORMAL LOW (ref 83.0–108.0)

## 2021-03-07 LAB — MAGNESIUM: Magnesium: 2.8 mg/dL — ABNORMAL HIGH (ref 1.7–2.4)

## 2021-03-07 MED ORDER — FUROSEMIDE 10 MG/ML IJ SOLN
60.0000 mg | Freq: Two times a day (BID) | INTRAMUSCULAR | Status: DC
  Administered 2021-03-07: 60 mg via INTRAVENOUS

## 2021-03-07 MED ORDER — FUROSEMIDE 10 MG/ML IJ SOLN
40.0000 mg | Freq: Two times a day (BID) | INTRAMUSCULAR | Status: DC
  Administered 2021-03-07 – 2021-03-08 (×2): 40 mg via INTRAVENOUS
  Filled 2021-03-07 (×3): qty 4

## 2021-03-07 MED ORDER — HYDROCORTISONE 1 % EX CREA
TOPICAL_CREAM | Freq: Four times a day (QID) | CUTANEOUS | Status: DC
  Administered 2021-03-08: 1 via TOPICAL
  Filled 2021-03-07: qty 28

## 2021-03-07 MED ORDER — METHYLPREDNISOLONE SODIUM SUCC 125 MG IJ SOLR
60.0000 mg | Freq: Three times a day (TID) | INTRAMUSCULAR | Status: DC
  Administered 2021-03-07 – 2021-03-08 (×4): 60 mg via INTRAVENOUS
  Filled 2021-03-07 (×4): qty 2

## 2021-03-07 MED ORDER — CLONIDINE HCL 0.1 MG/24HR TD PTWK
0.1000 mg | MEDICATED_PATCH | TRANSDERMAL | Status: DC
  Administered 2021-03-07: 0.1 mg via TRANSDERMAL
  Filled 2021-03-07: qty 1

## 2021-03-07 MED ORDER — TRAZODONE HCL 50 MG PO TABS
50.0000 mg | ORAL_TABLET | Freq: Every day | ORAL | Status: DC
  Administered 2021-03-08: 50 mg via ORAL

## 2021-03-07 NOTE — Progress Notes (Signed)
Patient ID: Isaac [name], male   DOB: 1956-02-14, 65 y.o.   MRN: [id number]  PROGRESS NOTE    JAMARIUS SAHA  WUJ:[id number] DOB: 30-Oct-1956 DOA: 03/04/2021 PCP: Jani Gravel, MD   Brief Narrative:  65 y.o. male with medical history significant of advanced COPD on home O2 usually at 3 L/min, CLL, anxiety disorder, morbid obesity, hypertension, hyperlipidemia, tobacco dependence and GERD, obstructive sleep apnea, prior history of intubation/tracheostomy during last admission presented with worsening shortness of breath and cough.  He was found to be hypoxic with oxygen saturations of 82% on his oxygen by EMS.  He was put on BiPAP.  ABG showed pH of 7.298, PCO2 of 69.80 and PO2 of 165.  Chest x-ray showed cardiomegaly and changes of COPD with no acute findings.  CT angiogram showed no pulmonary embolus but showed groundglass opacity in the right lower lobe and right middle lobe with extensive lymphadenopathy consistent with his CLL he was admitted for multilobar pneumonia and COPD exacerbation.  Assessment & Plan:   Acute on chronic hypoxic/hypercapnic respiratory failure COPD exacerbation Multilobar pneumonia History of tobacco dependence -Patient normally wears 3 L oxygen at home via nasal cannula.  He was hypercapnic on presentation requiring BiPAP.  Currently on 4 L oxygen via nasal cannula during the day and BiPAP at night.   -CT angiogram showed no pulmonary embolus but showed groundglass opacity in the right lower lobe and right middle lobe with extensive lymphadenopathy consistent with his CLL -COVID-19 test was negative on presentation -DC vancomycin.  Continue cefepime.  Decrease Solu-Medrol 60 mg IV every 8 hours.  Continue nebs and Dulera.  If respiratory status does not improve, might need pulmonary evaluation inpatient otherwise outpatient pulmonary follow-up -Cultures negative so far -Patient received intravenous Lasix on 03/05/2021; will continue IV Lasix today as  well.  Hyperkalemia -Potassium 6.1. Unclear cause.  Will repeat stat labs.  CLL with chronic leukocytosis -Outpatient follow-up with pulmonary  Obesity -Outpatient follow-up  Essential hypertension -Monitor blood pressure.  Continue metoprolol.  Hyperkalemia -Mild.  Monitor  Generalized deconditioning -PT evaluation once respiratory status improved slightly. -Palliative care consultation is pending  DVT prophylaxis: Lovenox Code Status: Full Family Communication: Wife at bedside on 03/07/2021  disposition Plan: Status is: Inpatient  Remains inpatient appropriate because:Inpatient level of care appropriate due to severity of illness   Dispo: The patient is from: Home              Anticipated d/c is to: Home in 2 to 3 days if clinically improves              Patient currently is not medically stable to d/c.   Difficult to place patient No  Consultants: Palliative care  Procedures: None  Antimicrobials:  Anti-infectives (From admission, onward)   Start     Dose/Rate Route Frequency Ordered Stop   03/05/21 2000  vancomycin (VANCOCIN) 2,250 mg in sodium chloride 0.9 % 500 mL IVPB        2,250 mg 250 mL/hr over 120 Minutes Intravenous Every 24 hours 03/04/21 1942     03/05/21 0400  ceFEPIme (MAXIPIME) 2 g in sodium chloride 0.9 % 100 mL IVPB        2 g 200 mL/hr over 30 Minutes Intravenous Every 8 hours 03/04/21 1854     03/05/21 0100  levofloxacin (LEVAQUIN) IVPB 750 mg  Status:  Discontinued        750 mg 100 mL/hr over 90 Minutes Intravenous Daily at bedtime 03/05/21 0005  03/06/21 0917   03/04/21 1915  vancomycin (VANCOREADY) IVPB 2000 mg/400 mL        2,000 mg 200 mL/hr over 120 Minutes Intravenous  Once 03/04/21 1851 03/04/21 2335   03/04/21 1900  ceFEPIme (MAXIPIME) 2 g in sodium chloride 0.9 % 100 mL IVPB        2 g 200 mL/hr over 30 Minutes Intravenous STAT 03/04/21 1854 03/04/21 2040       Subjective: Patient seen and examined at bedside.  Feels much  better but still short of breath with exertion with intermittent cough.  No overnight fever, vomiting, chest pain reported. Objective: Vitals:   03/06/21 2034 03/06/21 2100 03/06/21 2300 03/07/21 0403  BP:  (!) 147/76 (!) 175/97 (!) 147/90  Pulse:  99 90 78  Resp:  18 20 18   Temp:  98.1 F (36.7 C) 98.3 F (36.8 C) 98.1 F (36.7 C)  TempSrc:  Oral Oral Oral  SpO2: 97% 93% 95% 99%  Weight:      Height:        Intake/Output Summary (Last 24 hours) at 03/07/2021 0740 Last data filed at 03/07/2021 0404 Gross per 24 hour  Intake 200 ml  Output 3200 ml  Net -3000 ml   Filed Weights   03/04/21 1607 03/04/21 2224  Weight: 120 kg 97.2 kg    Examination:  General exam: Currently on 4 L oxygen via nasal cannula.  Looks chronically ill and deconditioned but better than yesterday.   Respiratory system: Bilateral decreased breath sounds at bases with scattered crackles Cardiovascular system: S1-S2 heard, rate controlled  gastrointestinal system: Abdomen is slightly obese, distended slightly, soft and nontender.  Normal bowel sounds heard Extremities: No cyanosis; lower extremity edema present bilaterally Central nervous system: Alert, still very slow to respond.  No focal neurological deficits.  Moving extremities Skin: No obvious petechiae/rashes  psychiatry: Flat affect  Data Reviewed: I have personally reviewed following labs and imaging studies  CBC: Recent Labs  Lab 03/04/21 1554 03/04/21 1609 03/04/21 1610 03/05/21 0150 03/06/21 0148 03/07/21 0427  WBC 130.4*  --   --  124.3* 138.6* 141.0*  NEUTROABS 4.2  --   --  4.4 7.5 6.5  HGB 12.2* 13.3 13.9 11.7* 10.9* 11.7*  HCT 41.4 39.0 41.0 38.8* 36.2* 38.3*  MCV 95.0  --   --  90.9 89.8 90.3  PLT 235  --   --  229 215 397   Basic Metabolic Panel: Recent Labs  Lab 03/04/21 1554 03/04/21 1609 03/04/21 1610 03/05/21 0150 03/06/21 0148 03/07/21 0427  NA 139 140 138 140 139 139  K 5.3* 4.2 4.8 5.1 5.2* 6.1*  CL 102  --   100 101 98 97*  CO2 31  --   --  28 27 34*  GLUCOSE 116*  --  110* 192* 168* 198*  BUN 10  --  12 16 32* 43*  CREATININE 1.01  --  0.90 0.99 1.08 1.22  CALCIUM 9.1  --   --  9.2 8.6* 8.7*  MG  --   --   --   --  2.5* 2.8*   GFR: Estimated Creatinine Clearance: 70.6 mL/min (by C-G formula based on SCr of 1.22 mg/dL). Liver Function Tests: Recent Labs  Lab 03/04/21 1554 03/05/21 0150 03/06/21 0148  AST 28 22 37  ALT 12 14 13   ALKPHOS 128* 123 112  BILITOT 1.0 1.1 1.1  PROT 6.1* 5.7* 5.6*  ALBUMIN 3.7 3.5 3.3*   No results for input(s):  LIPASE, AMYLASE in the last 168 hours. No results for input(s): AMMONIA in the last 168 hours. Coagulation Profile: No results for input(s): INR, PROTIME in the last 168 hours. Cardiac Enzymes: No results for input(s): CKTOTAL, CKMB, CKMBINDEX, TROPONINI in the last 168 hours. BNP (last 3 results) No results for input(s): PROBNP in the last 8760 hours. HbA1C: No results for input(s): HGBA1C in the last 72 hours. CBG: Recent Labs  Lab 03/04/21 2222  GLUCAP 195*   Lipid Profile: No results for input(s): CHOL, HDL, LDLCALC, TRIG, CHOLHDL, LDLDIRECT in the last 72 hours. Thyroid Function Tests: No results for input(s): TSH, T4TOTAL, FREET4, T3FREE, THYROIDAB in the last 72 hours. Anemia Panel: No results for input(s): VITAMINB12, FOLATE, FERRITIN, TIBC, IRON, RETICCTPCT in the last 72 hours. Sepsis Labs: Recent Labs  Lab 03/04/21 2030 03/04/21 2306  LATICACIDVEN 2.0* 2.3*    Recent Results (from the past 240 hour(s))  Blood culture (routine x 2)     Status: None (Preliminary result)   Collection Time: 03/04/21  8:03 PM   Specimen: BLOOD RIGHT FOREARM  Result Value Ref Range Status   Specimen Description BLOOD RIGHT FOREARM  Final   Special Requests   Final    BOTTLES DRAWN AEROBIC AND ANAEROBIC Blood Culture adequate volume   Culture   Final    NO GROWTH 2 DAYS Performed at [facility], [place] [address], [place],  [place] [address]    Report Status PENDING  Incomplete  Blood culture (routine x 2)     Status: None (Preliminary result)   Collection Time: 03/04/21  8:03 PM   Specimen: BLOOD RIGHT HAND  Result Value Ref Range Status   Specimen Description BLOOD RIGHT HAND  Final   Special Requests   Final    BOTTLES DRAWN AEROBIC AND ANAEROBIC Blood Culture adequate volume   Culture   Final    NO GROWTH 2 DAYS Performed at [facility], [place] [address], [place], [place] [address]    Report Status PENDING  Incomplete  SARS CORONAVIRUS 2 (TAT 6-24 HRS) Nasopharyngeal Nasopharyngeal Swab     Status: None   Collection Time: 03/04/21  8:12 PM   Specimen: Nasopharyngeal Swab  Result Value Ref Range Status   SARS Coronavirus 2 NEGATIVE NEGATIVE Final    Comment: (NOTE) SARS-CoV-2 target nucleic acids are NOT DETECTED.  The SARS-CoV-2 RNA is generally detectable in upper and lower respiratory specimens during the acute phase of infection. Negative results do not preclude SARS-CoV-2 infection, do not rule out co-infections with other pathogens, and should not be used as the sole basis for treatment or other patient management decisions. Negative results must be combined with clinical observations, patient history, and epidemiological information. The expected result is Negative.  Fact Sheet for Patients: SugarRoll.be  Fact Sheet for Healthcare Providers: https://www.woods-mathews.com/  This test is not yet approved or cleared by the [place] FDA and  has been authorized for detection and/or diagnosis of SARS-CoV-2 by FDA under an Emergency Use Authorization (EUA). This EUA will remain  in effect (meaning this test can be used) for the duration of the COVID-19 declaration under Se ction 564(b)(1) of the Act, 21 U.S.C. section 360bbb-3(b)(1), unless the authorization is terminated or revoked sooner.  Performed at [facility], [place] [address]  [place], [place] [address]   MRSA PCR Screening     Status: None   Collection Time: 03/05/21  3:36 PM   Specimen: Nasal Mucosa; Nasopharyngeal  Result  Value Ref Range Status   MRSA by PCR NEGATIVE NEGATIVE Final    Comment:        The GeneXpert MRSA Assay (FDA approved for NASAL specimens only), is one component of a comprehensive MRSA colonization surveillance program. It is not intended to diagnose MRSA infection nor to guide or monitor treatment for MRSA infections. Performed at [facility], [place] [address] [place], [place] [address]          Radiology Studies: No results found.      Scheduled Meds: . chlorhexidine  15 mL Mouth Rinse BID  . enoxaparin (LOVENOX) injection  40 mg Subcutaneous Daily  . ipratropium-albuterol  3 mL Nebulization QID  . mouth rinse  15 mL Mouth Rinse q12n4p  . methylPREDNISolone (SOLU-MEDROL) injection  60 mg Intravenous Q6H  . metoprolol tartrate  25 mg Oral BID  . mometasone-formoterol  2 puff Inhalation BID   Continuous Infusions: . ceFEPime (MAXIPIME) IV 2 g (03/07/21 0304)  . vancomycin 2,250 mg (03/06/21 2033)          Aline August, MD [facility] 03/07/2021, 7:40 AM

## 2021-03-07 NOTE — Plan of Care (Signed)

## 2021-03-07 NOTE — Procedures (Signed)
Objective Swallowing Evaluation: Type of Study: FEES-Fiberoptic Endoscopic Evaluation of Swallow   Patient Details  Name: Isaac [name] MRN: [id number] Date of Birth: [birth year]  Today's Date: 03/07/2021 Time: SLP Start Time (ACUTE ONLY): 1115 -SLP Stop Time (ACUTE ONLY): 1139  SLP Time Calculation (min) (ACUTE ONLY): 24 min   Past Medical History:  Past Medical History:  Diagnosis Date  . Acute on chronic respiratory failure with hypoxia ([place])   . Anxiety   . Chronic lymphocytic leukemia in remission ([place])   . COPD (chronic obstructive pulmonary disease) ([place])   . COPD, severe ([place])   . GERD (gastroesophageal reflux disease)   . Hematuria   . Hyperlipidemia   . Hypertension   . Insomnia   . Left lower lobe pneumonia   . Nephrolithiasis   . Tobacco dependence    Past Surgical History:  Past Surgical History:  Procedure Laterality Date  . LITHOTRIPSY     HPI: Pt is a 65 y.o. male with medical history significant of advanced COPD on home O2 usually at 3 L/min, anxiety disorder, morbid obesity, hypertension, hyperlipidemia, tobacco dependence and GERD, obstructive sleep apnea. Pt had intubation and subsequent trach during admission in January, 2022 with decannulation on 01/08/21; pt was not seen by SLP during the acute admission but he was subsequently discharged to [facility]. Images from [facility] at [facility] on 01/03/21 revealed penetration (PAS 3) of nectar thick and thin liquids secondary to pharyngeal delay; recommendations thereafter are unknown. Pt presented to the ED with sudden onset of shortness of breath, cough and significant hypoxemia.  CT chest 4/9: Ground-glass airspace opacities involving the upper lobes bilaterally, the R lower lobe, and the right middle lobe, likely indicating pneumonia.   Subjective: "Can I have some water before we start?"    Assessment / Plan / Recommendation  CHL IP CLINICAL IMPRESSIONS 03/07/2021  Clinical Impression Oropharyngeal swallow function  evaluated with FEES. He presented with mildly reduced right arytenoid cartilidge adduction and white areas diffusely on laryngeal tissue. Arytenoids with mild edema. Difficutly to fully observed vocal cord adduction but appeared to be incomplete. Pt eats all meals at home in bed in a reclined position (approx 25 degrees) and has declined wife's efforts to consume meals in upright position. Head of bed positioned in 25 degree position during FEES to mimic baseline. Lingual residue spilled briefly to pyriform sinuses with awareness and clearance with spontaneous sub swallow. He is endentulous and able to masticate solid cracker in timely manner. Timing of pharyngeal swallow was adeqaute and and trace-min pyriform sinus and transient interarytenoid residue present. No laryngeal penetration or aspiration was observed cues to take multiple, consecutive straw sips thin to challenge integrity of laryngeal protection. Given that he is endentulous and wife provides ground foods for pt at home, recommend continue Dys 2, thin, pils whole in puree and discharge ST at thid time. Continued education re: benefits of safe positioning during meals.  SLP Visit Diagnosis Dysphagia, oropharyngeal phase (R13.12)  Attention and concentration deficit following --  Frontal lobe and executive function deficit following --  Impact on safety and function Mild aspiration risk      CHL IP TREATMENT RECOMMENDATION 03/07/2021  Treatment Recommendations No treatment recommended at this time     Prognosis 03/06/2021  Prognosis for Safe Diet Advancement Fair  Barriers to Reach Goals Time post onset  Barriers/Prognosis Comment --    CHL IP DIET RECOMMENDATION 03/07/2021  SLP Diet Recommendations Dysphagia 2 (Fine chop) solids;Thin liquid  Liquid Administration via  Straw  Medication Administration Whole meds with liquid  Compensations Slow rate;Small sips/bites  Postural Changes Seated upright at 90 degrees      CHL IP OTHER  RECOMMENDATIONS 03/07/2021  Recommended Consults --  Oral Care Recommendations Oral care BID  Other Recommendations --      CHL IP FOLLOW UP RECOMMENDATIONS 03/07/2021  Follow up Recommendations None      CHL IP FREQUENCY AND DURATION 03/06/2021  Speech Therapy Frequency (ACUTE ONLY) min 2x/week  Treatment Duration 1 week           CHL IP ORAL PHASE 03/07/2021  Oral Phase Impaired  Oral - Pudding Teaspoon --  Oral - Pudding Cup --  Oral - Honey Teaspoon --  Oral - Honey Cup --  Oral - Nectar Teaspoon --  Oral - Nectar Cup --  Oral - Nectar Straw --  Oral - Thin Teaspoon --  Oral - Thin Cup Lingual/palatal residue  Oral - Thin Straw --  Oral - Puree WFL  Oral - Mech Soft --  Oral - Regular WFL  Oral - Multi-Consistency --  Oral - Pill --  Oral Phase - Comment --    CHL IP PHARYNGEAL PHASE 03/07/2021  Pharyngeal Phase Impaired  Pharyngeal- Pudding Teaspoon --  Pharyngeal --  Pharyngeal- Pudding Cup --  Pharyngeal --  Pharyngeal- Honey Teaspoon --  Pharyngeal --  Pharyngeal- Honey Cup --  Pharyngeal --  Pharyngeal- Nectar Teaspoon --  Pharyngeal --  Pharyngeal- Nectar Cup --  Pharyngeal --  Pharyngeal- Nectar Straw --  Pharyngeal --  Pharyngeal- Thin Teaspoon --  Pharyngeal --  Pharyngeal- Thin Cup --  Pharyngeal --  Pharyngeal- Thin Straw Pharyngeal residue - pyriform  Pharyngeal --  Pharyngeal- Puree WFL  Pharyngeal --  Pharyngeal- Mechanical Soft --  Pharyngeal --  Pharyngeal- Regular WFL  Pharyngeal --  Pharyngeal- Multi-consistency --  Pharyngeal --  Pharyngeal- Pill --  Pharyngeal --  Pharyngeal Comment --     CHL IP CERVICAL ESOPHAGEAL PHASE 03/07/2021  Cervical Esophageal Phase WFL  Pudding Teaspoon --  Pudding Cup --  Honey Teaspoon --  Honey Cup --  Nectar Teaspoon --  Nectar Cup --  Nectar Straw --  Thin Teaspoon --  Thin Cup --  Thin Straw --  Puree --  Mechanical Soft --  Regular --  Multi-consistency --  Pill --  Cervical  Esophageal Comment --     Houston Siren 03/07/2021, 4:45 PM

## 2021-03-07 NOTE — Progress Notes (Signed)
Pharmacy Antibiotic Note  Isaac [name] is a 65 y.o. male admitted on 03/04/2021 with pneumonia.  Pharmacy consulted 4/9 for Cefepime and Vancomycin dosing.MRSA PCR negative  Blood cultures no growth to date. .  Vancomycin discontinued on 4/12 per MD.  Plan: Continue Cefepime 2gm IV q8h Will f/u renal function, micro data, and pt's clinical condition   Height: 5\' 10"  (177.8 cm) Weight: 97.2 kg (214 lb 4.6 oz) IBW/kg (Calculated) : 73  Temp (24hrs), Avg:98.1 F (36.7 C), Min:98 F (36.7 C), Max:98.3 F (36.8 C)  Recent Labs  Lab 03/04/21 1554 03/04/21 1610 03/04/21 2030 03/04/21 2306 03/05/21 0150 03/06/21 0148 03/07/21 0427 03/07/21 0753  WBC 130.4*  --   --   --  124.3* 138.6* 141.0*  --   CREATININE 1.01 0.90  --   --  0.99 1.08 1.22 1.20  LATICACIDVEN  --   --  2.0* 2.3*  --   --   --   --     Estimated Creatinine Clearance: 71.8 mL/min (by C-G formula based on SCr of 1.2 mg/dL).    Allergies  Allergen Reactions  . Codeine Itching and Other (See Comments)    "jittery"  . Prozac [Fluoxetine Hcl] Other (See Comments)    confusion    Antimicrobials this admission: 4/9 Vanc >> 4/12 4/9 Cefepime >>  4/9 Levaquin >> 4/11   Microbiology results: 4/9 Bcx:  ngtd  4/10 MRSA PCR neg 4/9 covid: neg  Thank you for allowing pharmacy to be a part of this patient's care.  Nicole Cella, RPh Clinical Pharmacist [phone] Please check AMION for all [place] phone numbers After 10:00 PM, call [place] [phone] 03/07/2021 1:06 PM

## 2021-03-07 NOTE — Evaluation (Signed)
Physical Therapy Evaluation Patient Details Name: Isaac [name] MRN: [id number] DOB: September 20, 1956 Today's Date: 03/07/2021   History of Present Illness  65 y.o. male presented 03/04/21 with worsening shortness of breath and cough. Admitted for multilobar pneumonia and COPD exacerbation.  Medical history significant of advanced COPD on home O2 usually at 3 L/min, CLL, anxiety disorder, morbid obesity, hypertension, hyperlipidemia, tobacco dependence and GERD, obstructive sleep apnea, prior history of intubation/tracheostomy during last admission  Clinical Impression   Pt admitted secondary to problem above with deficits below. PTA patient was mostly staying in bed (per wife) after returning home March 1 from SNF. He walks up to 30 ft with HHPT. Pt currently requires rail and no assist to sit at EOB. He refused to attempt to stand, therefore unable to assess amount of assist he requires. Spoke to wife over phone and obtained prior functional status. She is hopeful pt will come straight home and not need further rehab on discharge from acute.  Will continue to follow acutely to maximize functional mobility independence and safety.       Follow Up Recommendations Home health PT;Supervision/Assistance - 24 hour    Equipment Recommendations  None recommended by PT    Recommendations for Other Services OT consult     Precautions / Restrictions Precautions Precautions: Fall Precaution Comments: uses 3L O2 at home      Mobility  Bed Mobility Overal bed mobility: Modified Independent             General bed mobility comments: HOB 15 degrees with rail; supine to sit and sit to supine    Transfers                 General transfer comment: pt refused due to fatigue  Ambulation/Gait                Stairs            Wheelchair Mobility    Modified Rankin (Stroke Patients Only)       Balance Overall balance assessment: Needs assistance Sitting-balance  support: Feet supported Sitting balance-Leahy Scale: Fair                                       Pertinent Vitals/Pain Pain Assessment: No/denies pain    Home Living Family/patient expects to be discharged to:: Private residence Living Arrangements: Spouse/significant other Available Help at Discharge: Family;Available 24 hours/day (wife works from home) Type of Home: House Home Access: [place]: One [place]: Transport chair;Bedside commode;Walker - 2 wheels;Wheelchair - manual Additional Comments: per wife via phone    Prior Function Level of Independence: Needs assistance   Gait / Transfers Assistance Needed: per wife, frequently refuses to get OOB; only walks limited distance ~30 ft with HHPT; transport chair for community  ADL's / Homemaking Assistance Needed: wife reports he does a bed bath where he washes himself after she provides supplies; He fatigues quickly in sitting and prefers this method; He dresses his upper body and she helps with his lower body        Hand Dominance   Dominant Hand: Right    Extremity/Trunk Assessment   Upper Extremity Assessment Upper Extremity Assessment: Defer to OT evaluation    Lower Extremity Assessment Lower Extremity Assessment: Generalized weakness    Cervical / Trunk Assessment Cervical / Trunk Assessment: Kyphotic  Communication  Communication: No difficulties  Cognition Arousal/Alertness: Awake/alert Behavior During Therapy: Flat affect Overall Cognitive Status: No family/caregiver present to determine baseline cognitive functioning                                 General Comments: Surprised that today was Tuesday, thought he was admitted because of his leukemia      General Comments      Exercises     Assessment/Plan    PT Assessment Patient needs continued PT services  PT Problem List Decreased strength;Decreased activity tolerance;Decreased  balance;Decreased mobility;Decreased cognition;Decreased knowledge of use of DME;Cardiopulmonary status limiting activity       PT Treatment Interventions DME instruction;Gait training;Functional mobility training;Therapeutic activities;Therapeutic exercise;Balance training;Cognitive remediation;Patient/family education    PT Goals (Current goals can be found in the Care Plan section)  Acute Rehab PT Goals Patient Stated Goal: wants to go home and not to SNF/rehab PT Goal Formulation: With patient Time For Goal Achievement: 03/21/21 Potential to Achieve Goals: Fair    Frequency Min 3X/week   Barriers to discharge        Co-evaluation               AM-PAC PT "6 Clicks" Mobility  Outcome Measure Help needed turning from your back to your side while in a flat bed without using bedrails?: None Help needed moving from lying on your back to sitting on the side of a flat bed without using bedrails?: A Little Help needed moving to and from a bed to a chair (including a wheelchair)?: A Lot Help needed standing up from a chair using your arms (e.g., wheelchair or bedside chair)?: A Lot Help needed to walk in hospital room?: A Lot Help needed climbing 3-5 steps with a railing? : Total 6 Click Score: 14    End of Session Equipment Utilized During Treatment: Oxygen (4L) Activity Tolerance: Patient limited by fatigue Patient left: in bed;with call bell/phone within reach;with bed alarm set   PT Visit Diagnosis: Muscle weakness (generalized) (M62.81);Difficulty in walking, not elsewhere classified (R26.2)    Time: 1345-1400 PT Time Calculation (min) (ACUTE ONLY): 15 min   Charges:   PT Evaluation $PT Eval Low Complexity: 1 Low           Arby Barrette, PT Pager [phone]   Rexanne Mano 03/07/2021, 2:24 PM

## 2021-03-08 ENCOUNTER — Encounter: Payer: Self-pay | Admitting: Internal Medicine

## 2021-03-08 DIAGNOSIS — J9621 Acute and chronic respiratory failure with hypoxia: Secondary | ICD-10-CM

## 2021-03-08 LAB — CBC WITH DIFFERENTIAL/PLATELET
Abs Immature Granulocytes: 0.24 10*3/uL — ABNORMAL HIGH (ref 0.00–0.07)
Basophils Absolute: 0.2 10*3/uL — ABNORMAL HIGH (ref 0.0–0.1)
Basophils Relative: 0 %
Eosinophils Absolute: 0 10*3/uL (ref 0.0–0.5)
Eosinophils Relative: 0 %
HCT: 38.3 % — ABNORMAL LOW (ref 39.0–52.0)
Hemoglobin: 11.6 g/dL — ABNORMAL LOW (ref 13.0–17.0)
Immature Granulocytes: 0 %
Lymphocytes Relative: 79 %
Lymphs Abs: 106.5 10*3/uL — ABNORMAL HIGH (ref 0.7–4.0)
MCH: 27.8 pg (ref 26.0–34.0)
MCHC: 30.3 g/dL (ref 30.0–36.0)
MCV: 91.8 fL (ref 80.0–100.0)
Monocytes Absolute: 22.2 10*3/uL — ABNORMAL HIGH (ref 0.1–1.0)
Monocytes Relative: 17 %
Neutro Abs: 5.4 10*3/uL (ref 1.7–7.7)
Neutrophils Relative %: 4 %
Platelets: 195 10*3/uL (ref 150–400)
RBC: 4.17 MIL/uL — ABNORMAL LOW (ref 4.22–5.81)
RDW: 14.8 % (ref 11.5–15.5)
WBC: 134.6 10*3/uL (ref 4.0–10.5)
nRBC: 0 % (ref 0.0–0.2)

## 2021-03-08 LAB — HEMOGLOBIN A1C
Hgb A1c MFr Bld: 6 % — ABNORMAL HIGH (ref 4.8–5.6)
Mean Plasma Glucose: 125.5 mg/dL

## 2021-03-08 LAB — BASIC METABOLIC PANEL
Anion gap: 11 (ref 5–15)
BUN: 48 mg/dL — ABNORMAL HIGH (ref 8–23)
CO2: 37 mmol/L — ABNORMAL HIGH (ref 22–32)
Calcium: 9.1 mg/dL (ref 8.9–10.3)
Chloride: 93 mmol/L — ABNORMAL LOW (ref 98–111)
Creatinine, Ser: 1.16 mg/dL (ref 0.61–1.24)
GFR, Estimated: >60 mL/min (ref >60)
Glucose, Bld: 215 mg/dL — ABNORMAL HIGH (ref 70–99)
Potassium: 4.3 mmol/L (ref 3.5–5.1)
Sodium: 141 mmol/L (ref 135–145)

## 2021-03-08 LAB — MAGNESIUM: Magnesium: 3 mg/dL — ABNORMAL HIGH (ref 1.7–2.4)

## 2021-03-08 LAB — GLUCOSE, CAPILLARY: Glucose-Capillary: 262 mg/dL — ABNORMAL HIGH (ref 70–99)

## 2021-03-08 MED ORDER — ALLOPURINOL 100 MG PO TABS: 100.0000 mg | ORAL_TABLET | Freq: Every day | ORAL | Status: DC

## 2021-03-08 MED ORDER — PREDNISONE 50 MG PO TABS: 50.0000 mg | ORAL_TABLET | Freq: Every day | ORAL | 0 refills | Status: AC

## 2021-03-08 MED ORDER — IPRATROPIUM-ALBUTEROL 0.5-2.5 (3) MG/3ML IN SOLN: 3.0000 mL | Freq: Four times a day (QID) | RESPIRATORY_TRACT | Status: DC

## 2021-03-08 MED ORDER — INSULIN ASPART 100 UNIT/ML ~~LOC~~ SOLN
0.0000 [IU] | Freq: Three times a day (TID) | SUBCUTANEOUS | Status: DC
  Administered 2021-03-08: 8 [IU] via SUBCUTANEOUS

## 2021-03-08 MED ORDER — CEFDINIR 300 MG PO CAPS: 300.0000 mg | ORAL_CAPSULE | Freq: Two times a day (BID) | ORAL | 0 refills | Status: AC

## 2021-03-08 MED ORDER — INSULIN ASPART 100 UNIT/ML ~~LOC~~ SOLN: 0.0000 [IU] | Freq: Every day | SUBCUTANEOUS | Status: DC

## 2021-03-08 MED ORDER — MONTELUKAST SODIUM 10 MG PO TABS: 10.0000 mg | ORAL_TABLET | Freq: Every evening | ORAL | Status: DC

## 2021-03-08 NOTE — Discharge Summary (Signed)
Physician Discharge Summary  Isaac RAHMING XIP:[id number] DOB: 11-28-1955 DOA: 03/04/2021  PCP: Jani Gravel, MD  Admit date: 03/04/2021 Discharge date: 03/08/2021 30 Day Unplanned Readmission Risk Score   Flowsheet Row ED to Hosp-Admission (Current) from 03/04/2021 in [facility]  30 Day Unplanned Readmission Risk Score (%) 25.9 Filed at 03/08/2021 0801     This score is the patient's risk of an unplanned readmission within 30 days of being discharged (0 -100%). The score is based on dignosis, age, lab data, medications, orders, and past utilization.   Low:  0-14.9   Medium: 15-21.9   High: 22-29.9   Extreme: 30 and above         Admitted From: Home Disposition: Home  Recommendations for Outpatient Follow-up:  1. Follow up with PCP in 1-2 weeks 2. Please obtain BMP/CBC in one week 3. Please follow up with your PCP on the following pending results: Unresulted Labs (From admission, onward)          Start     Ordered   03/11/21 0500  Creatinine, serum  (enoxaparin (LOVENOX)    CrCl >/= 30 ml/min)  Weekly,   R     Comments: while on enoxaparin therapy    03/05/21 0005   03/08/21 0938  Hemoglobin A1c  Once,   R       Comments: To assess prior glycemic control   Question:  Specimen collection method  Answer:  Lab=Lab collect   03/08/21 6734   03/07/21 1937  Basic metabolic panel  Daily,   R     Question:  Specimen collection method  Answer:  Lab=Lab collect   03/06/21 0917   03/05/21 0006  Strep pneumoniae urinary antigen  Once,   R        03/05/21 0005            Home Health: Yes Equipment/Devices: None  Discharge Condition: Stable CODE STATUS: Full code Diet recommendation: Cardiac  Subjective: Seen and examined this morning.  Feels like his breathing is back to baseline and does not have any shortness of breath.  Brief/Interim Summary: 65 y.o.malewith medical history significant ofadvanced COPD on home O2 usually at 3 L/min, CLL, anxiety  disorder, morbid obesity, hypertension, hyperlipidemia, tobacco dependence and GERD, obstructive sleep apnea, prior history of intubation/tracheostomy during last admission presented with worsening shortness of breath and cough.  He was found to be hypoxic with oxygen saturations of 82% on his oxygen by EMS.  He was put on BiPAP.  ABG showed pH of 7.298, PCO2 of 69.80 and PO2 of 165.  Chest [name]-ray showed cardiomegaly and changes of COPD with no acute findings.  CT angiogram showed no pulmonary embolus but showed groundglass opacity in the right lower lobe and right middle lobe with extensive lymphadenopathy consistent with his CLL he was admitted for multilobar pneumonia and COPD exacerbation.  He was treated with bronchodilators, Solu-Medrol as well as IV antibiotics of vancomycin and cefepime.  MRSA was negative so vancomycin was discontinued.  He also received some intermittent Lasix due to possible fluid overload.  He was evaluated by PT OT who recommended home health PT.Patient has improved significantly.  He is back to his baseline oxygen requirement.  Denies any shortness of breath.  He is a stable and thus he is going to be discharged home today.   I have discussed his discharge plan with his wife who was also delighted to hear about the discharge plan.  I am going  to discharge him home on 2 more days of oral prednisone and cefdinir.  Discharge Diagnoses:  Principal Problem:   Acute on chronic respiratory failure with hypoxia (HCC) Active Problems:   Obesity   COPD exacerbation (HCC)   Hypertension   Chronic diastolic heart failure (HCC)   Hyperkalemia   Chronic lymphocytic leukemia in remission ([place])   HCAP (healthcare-associated pneumonia)    Discharge Instructions   Allergies as of 03/08/2021      Reactions   Codeine Itching, Other (See Comments)   "jittery"   Prozac [fluoxetine Hcl] Other (See Comments)   confusion      Medication List    STOP taking these medications    acetaminophen 160 MG/5ML solution Commonly known as: TYLENOL   chlorhexidine gluconate (MEDLINE KIT) 0.12 % solution Commonly known as: PERIDEX   Chlorhexidine Gluconate Cloth 2 % Pads   cloNIDine 0.1 MG tablet Commonly known as: CATAPRES   enoxaparin 60 MG/0.6ML injection Commonly known as: LOVENOX   free water Soln   hydrALAZINE 20 MG/ML injection Commonly known as: APRESOLINE   insulin aspart 100 UNIT/ML injection Commonly known as: novoLOG   mouth rinse Liqd solution   ondansetron 4 MG/2ML Soln injection Commonly known as: ZOFRAN   revefenacin 175 MCG/3ML nebulizer solution Commonly known as: YUPELRI   senna 8.6 MG Tabs tablet Commonly known as: SENOKOT     TAKE these medications   albuterol 108 (90 Base) MCG/ACT inhaler Commonly known as: VENTOLIN HFA Inhale 2 puffs into the lungs as needed for wheezing or shortness of breath.   albuterol (2.5 MG/3ML) 0.083% nebulizer solution Commonly known as: PROVENTIL Take 3 mLs (2.5 mg total) by nebulization every 4 (four) hours as needed for wheezing or shortness of breath.   allopurinol 100 MG tablet Commonly known as: ZYLOPRIM Place 1 tablet (100 mg total) into feeding tube daily.   arformoterol 15 MCG/2ML Nebu Commonly known as: BROVANA Take 2 mLs (15 mcg total) by nebulization 2 (two) times daily.   budesonide 0.5 MG/2ML nebulizer solution Commonly known as: PULMICORT Take 2 mLs (0.5 mg total) by nebulization 2 (two) times daily.   cefdinir 300 MG capsule Commonly known as: OMNICEF Take 1 capsule (300 mg total) by mouth 2 (two) times daily for 3 days.   cloNIDine 0.1 mg/24hr patch Commonly known as: CATAPRES - Dosed in mg/24 hr Place 0.1 mg onto the skin once a week. Tuesdays   dicyclomine 10 MG capsule Commonly known as: BENTYL Take 10 mg by mouth daily.   docusate sodium 100 MG capsule Commonly known as: COLACE Take 100 mg by mouth at bedtime. What changed: Another medication with the same name  was removed. Continue taking this medication, and follow the directions you see here.   Ensure Take 237 mLs by mouth daily. Chocolate flavor What changed: Another medication with the same name was removed. Continue taking this medication, and follow the directions you see here.   fluticasone 50 MCG/ACT nasal spray Commonly known as: FLONASE Place 1 spray into both nostrils daily.   furosemide 40 MG tablet Commonly known as: LASIX Take 40 mg by mouth daily. What changed: Another medication with the same name was removed. Continue taking this medication, and follow the directions you see here.   guaiFENesin 600 MG 12 hr tablet Commonly known as: MUCINEX Take 600 mg by mouth daily.   ipratropium-albuterol 0.5-2.5 (3) MG/3ML Soln Commonly known as: DUONEB Take 3 mLs by nebulization every 6 (six) hours as needed.  metoprolol tartrate 25 MG tablet Commonly known as: LOPRESSOR Take 25 mg by mouth 2 (two) times daily.   montelukast 10 MG tablet Commonly known as: SINGULAIR Take 10 mg by mouth every evening.   pantoprazole 40 MG tablet Commonly known as: PROTONIX Take 40 mg by mouth at bedtime. What changed: Another medication with the same name was removed. Continue taking this medication, and follow the directions you see here.   polyethylene glycol 17 g packet Commonly known as: MIRALAX / GLYCOLAX Place 17 g into feeding tube daily as needed for moderate constipation. What changed:   how to take this  when to take this   predniSONE 50 MG tablet Commonly known as: DELTASONE Take 1 tablet (50 mg total) by mouth daily with breakfast for 2 days. Start taking on: March 09, 2021   sodium chloride 0.65 % Soln nasal spray Commonly known as: OCEAN Place 2 sprays into both nostrils as needed for congestion.       Follow-up Information    Jani Gravel, MD Follow up in 1 week(s).   Specialty: Internal Medicine Contact information: [address] [place]  [address] [phone]              Allergies  Allergen Reactions  . Codeine Itching and Other (See Comments)    "jittery"  . Prozac [Fluoxetine Hcl] Other (See Comments)    confusion    Consultations: None   Procedures/Studies: CT Angio Chest PE W and/or Wo Contrast  Result Date: 03/04/2021 CLINICAL DATA:  65 year old who currently uses BiPAP for sleep apnea, presenting with acute onset of shortness of breath and hypoxia. Personal history of chronic lymphocytic leukemia. EXAM: CT ANGIOGRAPHY CHEST WITH CONTRAST TECHNIQUE: Multidetector CT imaging of the chest was performed using the standard protocol during bolus administration of intravenous contrast. Multiplanar CT image reconstructions and MIPs were obtained to evaluate the vascular anatomy. CONTRAST:  136m OMNIPAQUE IOHEXOL 350 MG/ML IV. COMPARISON:  None. FINDINGS: Cardiovascular: Contrast opacification of pulmonary arteries is very good. No filling defects within either main pulmonary artery or their segmental branches in either lung to suggest pulmonary embolism. Heart mildly to moderately enlarged. Moderate to severe three-vessel coronary atherosclerosis. No pericardial effusion. Mild atherosclerosis involving the thoracic aorta without evidence of aneurysm. Proximal great vessels widely patent. Mediastinum/Nodes: Extensive lymphadenopathy throughout the visualized lower cervical chains in the neck bilaterally, both axilla, throughout the mediastinum and both hila and in the retrocrural space. An index largest RIGHT axillary lymph node measures approximately 4.8 [name] 3.6 cm (5/36). An index largest LEFT axillary node measures approximately 4.0 [name] 3.0 cm (5/23). Index conglomerate subcarinal mediastinal lymphadenopathy measures approximately 5.0 [name] 2.2 cm (5/80). Normal appearing esophagus.  Normal-appearing thyroid gland. Lungs/Pleura: Emphysematous changes throughout both lungs. Ground-glass airspace opacities involving the upper lobes  bilaterally (RIGHT greater than LEFT), the RIGHT LOWER LOBE, and the RIGHT MIDDLE LOBE. Pleural thickening involving the major fissure on the LEFT. No pleural effusions. Central airways patent. Upper Abdomen: Benign cyst involving the LEFT hepatic lobe measuring approximately 5.5 [name] 5.8 cm (5/140). Massive splenic enlargement (the spleen is incompletely imaged). Numerous enlarged lymph nodes in the upper abdomen in the gastrohepatic ligament, the portacaval region, the aortocaval region and the periaortic region. Musculoskeletal: Mild degenerative changes involving the thoracic spine. No acute findings. Review of the MIP images confirms the above findings. IMPRESSION: 1. No evidence of pulmonary embolism. 2. Ground-glass airspace opacities involving the upper lobes bilaterally, the RIGHT LOWER LOBE, and the RIGHT MIDDLE  LOBE, likely indicating pneumonia. 3. Extensive lymphadenopathy throughout the visualized lower cervical chains bilaterally, both axilla, the mediastinum, both hila, the retrocrural space and the upper abdomen. Index lymph nodes are measured above. This likely indicates recurrence of the patient's CLL or transformation into lymphoma. 4. Massive splenic enlargement (the spleen is incompletely imaged). 5. Aortic Atherosclerosis (ICD10-I70.0) and Emphysema (ICD10-J43.9). Electronically Signed   By: Evangeline Dakin M.D.   On: 03/04/2021 18:41   DG Chest Port 1 View  Result Date: 03/04/2021 CLINICAL DATA:  Shortness of breath.  Respiratory distress. EXAM: PORTABLE CHEST 1 VIEW COMPARISON:  12/19/2020 FINDINGS: Stable enlarged cardiac silhouette. The central pulmonary arteries are prominent. The lungs are hyperexpanded with diffuse prominence of the interstitial markings, bullous changes on the left and normal vasculature. Diffuse osteopenia. IMPRESSION: Stable cardiomegaly and changes of COPD. No acute abnormality. Electronically Signed   By: Claudie Revering M.D.   On: 03/04/2021 16:09      Discharge  Exam: Vitals:   03/08/21 0749 03/08/21 0810  BP: (!) 165/76   Pulse: 77 77  Resp: 18 16  Temp: 97.6 F (36.4 C)   SpO2: 95% 97%   Vitals:   03/07/21 2114 03/08/21 0000 03/08/21 0749 03/08/21 0810  BP: (!) 157/77 (!) 143/73 (!) 165/76   Pulse:  66 77 77  Resp:  '16 18 16  ' Temp:  98 F (36.7 C) 97.6 F (36.4 C)   TempSrc:  Oral Oral   SpO2:  95% 95% 97%  Weight:      Height:        General: Pt is alert, awake, not in acute distress Cardiovascular: RRR, S1/S2 +, no rubs, no gallops Respiratory: Diminished breath sounds bilaterally, no wheezing, no rhonchi Abdominal: Soft, NT, ND, bowel sounds + Extremities: no edema, no cyanosis    The results of significant diagnostics from this hospitalization (including imaging, microbiology, ancillary and laboratory) are listed below for reference.     Microbiology: Recent Results (from the past 240 hour(s))  Blood culture (routine [name] 2)     Status: None (Preliminary result)   Collection Time: 03/04/21  8:03 PM   Specimen: BLOOD RIGHT FOREARM  Result Value Ref Range Status   Specimen Description BLOOD RIGHT FOREARM  Final   Special Requests   Final    BOTTLES DRAWN AEROBIC AND ANAEROBIC Blood Culture adequate volume   Culture   Final    NO GROWTH 4 DAYS Performed at [facility], [address], [place], [place] [address]    Report Status PENDING  Incomplete  Blood culture (routine [name] 2)     Status: None (Preliminary result)   Collection Time: 03/04/21  8:03 PM   Specimen: BLOOD RIGHT HAND  Result Value Ref Range Status   Specimen Description BLOOD RIGHT HAND  Final   Special Requests   Final    BOTTLES DRAWN AEROBIC AND ANAEROBIC Blood Culture adequate volume   Culture   Final    NO GROWTH 4 DAYS Performed at [facility], [place] [address] [place], [place] [address]    Report Status PENDING  Incomplete  SARS CORONAVIRUS 2 (TAT 6-24 HRS) Nasopharyngeal Nasopharyngeal Swab     Status: None   Collection Time:  03/04/21  8:12 PM   Specimen: Nasopharyngeal Swab  Result Value Ref Range Status   SARS Coronavirus 2 NEGATIVE NEGATIVE Final    Comment: (NOTE) SARS-CoV-2 target nucleic acids are NOT DETECTED.  The SARS-CoV-2 RNA is generally detectable in upper  and lower respiratory specimens during the acute phase of infection. Negative results do not preclude SARS-CoV-2 infection, do not rule out co-infections with other pathogens, and should not be used as the sole basis for treatment or other patient management decisions. Negative results must be combined with clinical observations, patient history, and epidemiological information. The expected result is Negative.  Fact Sheet for Patients: SugarRoll.be  Fact Sheet for Healthcare Providers: https://www.woods-mathews.com/  This test is not yet approved or cleared by the [place] FDA and  has been authorized for detection and/or diagnosis of SARS-CoV-2 by FDA under an Emergency Use Authorization (EUA). This EUA will remain  in effect (meaning this test can be used) for the duration of the COVID-19 declaration under Se ction 564(b)(1) of the Act, 21 U.S.C. section 360bbb-3(b)(1), unless the authorization is terminated or revoked sooner.  Performed at [facility], [place] [address], [place], [place] [address]   MRSA PCR Screening     Status: None   Collection Time: 03/05/21  3:36 PM   Specimen: Nasal Mucosa; Nasopharyngeal  Result Value Ref Range Status   MRSA by PCR NEGATIVE NEGATIVE Final    Comment:        The GeneXpert MRSA Assay (FDA approved for NASAL specimens only), is one component of a comprehensive MRSA colonization surveillance program. It is not intended to diagnose MRSA infection nor to guide or monitor treatment for MRSA infections. Performed at [facility], [place] [address], [place], [place] [address]      Labs: BNP (last 3 results) Recent Labs     12/08/20 0100 03/04/21 1555  BNP 146.1* 32.6   Basic Metabolic Panel: Recent Labs  Lab 03/05/21 0150 03/06/21 0148 03/07/21 0427 03/07/21 0753 03/08/21 0440  NA 140 139 139 141 141  K 5.1 5.2* 6.1* 4.4 4.3  CL 101 98 97* 94* 93*  CO2 28 27 34* 35* 37*  GLUCOSE 192* 168* 198* 218* 215*  BUN 16 32* 43* 44* 48*  CREATININE 0.99 1.08 1.22 1.20 1.16  CALCIUM 9.2 8.6* 8.7* 8.9 9.1  MG  --  2.5* 2.8*  --  3.0*   Liver Function Tests: Recent Labs  Lab 03/04/21 1554 03/05/21 0150 03/06/21 0148  AST 28 22 37  ALT '12 14 13  ' ALKPHOS 128* 123 112  BILITOT 1.0 1.1 1.1  PROT 6.1* 5.7* 5.6*  ALBUMIN 3.7 3.5 3.3*   No results for input(s): LIPASE, AMYLASE in the last 168 hours. No results for input(s): AMMONIA in the last 168 hours. CBC: Recent Labs  Lab 03/04/21 1554 03/04/21 1609 03/04/21 1610 03/05/21 0150 03/06/21 0148 03/07/21 0427 03/08/21 0440  WBC 130.4*  --   --  124.3* 138.6* 141.0* 134.6*  NEUTROABS 4.2  --   --  4.4 7.5 6.5 5.4  HGB 12.2*   < > 13.9 11.7* 10.9* 11.7* 11.6*  HCT 41.4   < > 41.0 38.8* 36.2* 38.3* 38.3*  MCV 95.0  --   --  90.9 89.8 90.3 91.8  PLT 235  --   --  229 215 223 195   < > = values in this interval not displayed.   Cardiac Enzymes: No results for input(s): CKTOTAL, CKMB, CKMBINDEX, TROPONINI in the last 168 hours. BNP: Invalid input(s): POCBNP CBG: Recent Labs  Lab 03/04/21 2222  GLUCAP 195*   D-Dimer No results for input(s): DDIMER in the last 72 hours. Hgb A1c No results for input(s): HGBA1C in the last 72 hours. Lipid Profile  No results for input(s): CHOL, HDL, LDLCALC, TRIG, CHOLHDL, LDLDIRECT in the last 72 hours. Thyroid function studies No results for input(s): TSH, T4TOTAL, T3FREE, THYROIDAB in the last 72 hours.  Invalid input(s): FREET3 Anemia work up No results for input(s): VITAMINB12, FOLATE, FERRITIN, TIBC, IRON, RETICCTPCT in the last 72 hours. Urinalysis    Component Value Date/Time   COLORURINE YELLOW  12/17/2016 1220   APPEARANCEUR CLEAR 12/17/2016 1220   LABSPEC 1.024 12/17/2016 1220   PHURINE 5.0 12/17/2016 1220   GLUCOSEU NEGATIVE 12/17/2016 1220   HGBUR NEGATIVE 12/17/2016 1220   BILIRUBINUR NEGATIVE 12/17/2016 1220   KETONESUR NEGATIVE 12/17/2016 1220   PROTEINUR NEGATIVE 12/17/2016 1220   UROBILINOGEN 0.2 04/24/2014 0558   NITRITE NEGATIVE 12/17/2016 1220   LEUKOCYTESUR MODERATE (A) 12/17/2016 1220   Sepsis Labs Invalid input(s): PROCALCITONIN,  WBC,  LACTICIDVEN Microbiology Recent Results (from the past 240 hour(s))  Blood culture (routine [name] 2)     Status: None (Preliminary result)   Collection Time: 03/04/21  8:03 PM   Specimen: BLOOD RIGHT FOREARM  Result Value Ref Range Status   Specimen Description BLOOD RIGHT FOREARM  Final   Special Requests   Final    BOTTLES DRAWN AEROBIC AND ANAEROBIC Blood Culture adequate volume   Culture   Final    NO GROWTH 4 DAYS Performed at [facility], [place] [address], [place], [place] [address]    Report Status PENDING  Incomplete  Blood culture (routine [name] 2)     Status: None (Preliminary result)   Collection Time: 03/04/21  8:03 PM   Specimen: BLOOD RIGHT HAND  Result Value Ref Range Status   Specimen Description BLOOD RIGHT HAND  Final   Special Requests   Final    BOTTLES DRAWN AEROBIC AND ANAEROBIC Blood Culture adequate volume   Culture   Final    NO GROWTH 4 DAYS Performed at [facility], [place] [address] [place], [place] [address]    Report Status PENDING  Incomplete  SARS CORONAVIRUS 2 (TAT 6-24 HRS) Nasopharyngeal Nasopharyngeal Swab     Status: None   Collection Time: 03/04/21  8:12 PM   Specimen: Nasopharyngeal Swab  Result Value Ref Range Status   SARS Coronavirus 2 NEGATIVE NEGATIVE Final    Comment: (NOTE) SARS-CoV-2 target nucleic acids are NOT DETECTED.  The SARS-CoV-2 RNA is generally detectable in upper and lower respiratory specimens during the acute phase of infection. Negative results do  not preclude SARS-CoV-2 infection, do not rule out co-infections with other pathogens, and should not be used as the sole basis for treatment or other patient management decisions. Negative results must be combined with clinical observations, patient history, and epidemiological information. The expected result is Negative.  Fact Sheet for Patients: SugarRoll.be  Fact Sheet for Healthcare Providers: https://www.woods-mathews.com/  This test is not yet approved or cleared by the [place] FDA and  has been authorized for detection and/or diagnosis of SARS-CoV-2 by FDA under an Emergency Use Authorization (EUA). This EUA will remain  in effect (meaning this test can be used) for the duration of the COVID-19 declaration under Se ction 564(b)(1) of the Act, 21 U.S.C. section 360bbb-3(b)(1), unless the authorization is terminated or revoked sooner.  Performed at [facility], [place] [address], [place], [place] [address]   MRSA PCR Screening     Status: None   Collection Time: 03/05/21  3:36 PM   Specimen: Nasal Mucosa; Nasopharyngeal  Result Value Ref Range Status  MRSA by PCR NEGATIVE NEGATIVE Final    Comment:        The GeneXpert MRSA Assay (FDA approved for NASAL specimens only), is one component of a comprehensive MRSA colonization surveillance program. It is not intended to diagnose MRSA infection nor to guide or monitor treatment for MRSA infections. Performed at [facility], [place] [address], [place], [place] [address]      Time coordinating discharge: Over 30 minutes  SIGNED:   Darliss Cheney, MD  [facility] 03/08/2021, 9:55 AM  If 7PM-7AM, please contact night-coverage [email or web address]

## 2021-03-08 NOTE — Discharge Instructions (Signed)

## 2021-03-08 NOTE — TOC Transition Note (Signed)
Transition of Care [facility]) - CM/SW Discharge Note   Patient Details  Name: Isaac [name] MRN: [id number] Date of Birth: 10/07/1956  [facility] CM/SW Contact:  Bethann Berkshire, [place] Phone Number: 03/08/2021, 10:01 AM   Clinical Narrative:     CSW met with pt for d/c planning. Pt lives at home with his wife in [place]. He was recently at a SNF in March 2022 and has since been receiving [facility] services. Pt is not sure what [place] or oxygen company he uses but confirms he is active.   CSW called pt wife and confirmed pt is active with [facility] and [employer] for oxygen. She will pick pt up in the afternoon and be able to bring his O2.   CSW confirmed with Hoyle Sauer with [facility] that pt is active with RN/PT/OT. MD has [place] orders in. TOC sign off.   Final next level of care: [place] Barriers to Discharge: No Barriers Identified   Patient Goals and CMS Choice        Discharge Placement                  Name of family member notified: Harce, Volden (Spouse)   [phone] [facility] Phone) Patient and family notified of of transfer: 03/08/21  Discharge Plan and Services                            [place]:  ([place]) Date [place] Contacted: 03/08/21   Representative spoke with at [place]: [place] ([place]) Interventions     Readmission Risk Interventions No flowsheet data found.

## 2021-03-09 ENCOUNTER — Encounter (HOSPITAL_COMMUNITY): Payer: Self-pay | Admitting: Pharmacist Clinician (PhC)/ Clinical Pharmacy Specialist

## 2021-03-09 LAB — CULTURE, BLOOD (ROUTINE X 2)
Culture: NO GROWTH
Culture: NO GROWTH
Special Requests: ADEQUATE
Special Requests: ADEQUATE

## 2021-03-10 DIAGNOSIS — C9110 Chronic lymphocytic leukemia of B-cell type not having achieved remission: Secondary | ICD-10-CM | POA: Diagnosis not present

## 2021-03-10 DIAGNOSIS — K219 Gastro-esophageal reflux disease without esophagitis: Secondary | ICD-10-CM | POA: Diagnosis not present

## 2021-03-10 DIAGNOSIS — T380X5S Adverse effect of glucocorticoids and synthetic analogues, sequela: Secondary | ICD-10-CM | POA: Diagnosis not present

## 2021-03-10 DIAGNOSIS — E871 Hypo-osmolality and hyponatremia: Secondary | ICD-10-CM | POA: Diagnosis not present

## 2021-03-10 DIAGNOSIS — I2781 Cor pulmonale (chronic): Secondary | ICD-10-CM | POA: Diagnosis not present

## 2021-03-10 DIAGNOSIS — I10 Essential (primary) hypertension: Secondary | ICD-10-CM | POA: Diagnosis not present

## 2021-03-10 DIAGNOSIS — J441 Chronic obstructive pulmonary disease with (acute) exacerbation: Secondary | ICD-10-CM | POA: Diagnosis not present

## 2021-03-10 DIAGNOSIS — J440 Chronic obstructive pulmonary disease with acute lower respiratory infection: Secondary | ICD-10-CM | POA: Diagnosis not present

## 2021-03-10 DIAGNOSIS — J189 Pneumonia, unspecified organism: Secondary | ICD-10-CM | POA: Diagnosis not present

## 2021-03-10 DIAGNOSIS — J9621 Acute and chronic respiratory failure with hypoxia: Secondary | ICD-10-CM | POA: Diagnosis not present

## 2021-03-10 DIAGNOSIS — R739 Hyperglycemia, unspecified: Secondary | ICD-10-CM | POA: Diagnosis not present

## 2021-03-10 DIAGNOSIS — G7281 Critical illness myopathy: Secondary | ICD-10-CM | POA: Diagnosis not present

## 2021-03-10 DIAGNOSIS — K5900 Constipation, unspecified: Secondary | ICD-10-CM | POA: Diagnosis not present

## 2021-03-13 DIAGNOSIS — G7281 Critical illness myopathy: Secondary | ICD-10-CM | POA: Diagnosis not present

## 2021-03-13 DIAGNOSIS — J441 Chronic obstructive pulmonary disease with (acute) exacerbation: Secondary | ICD-10-CM | POA: Diagnosis not present

## 2021-03-13 DIAGNOSIS — K5900 Constipation, unspecified: Secondary | ICD-10-CM | POA: Diagnosis not present

## 2021-03-13 DIAGNOSIS — I10 Essential (primary) hypertension: Secondary | ICD-10-CM | POA: Diagnosis not present

## 2021-03-13 DIAGNOSIS — C9110 Chronic lymphocytic leukemia of B-cell type not having achieved remission: Secondary | ICD-10-CM | POA: Diagnosis not present

## 2021-03-13 DIAGNOSIS — J440 Chronic obstructive pulmonary disease with acute lower respiratory infection: Secondary | ICD-10-CM | POA: Diagnosis not present

## 2021-03-13 DIAGNOSIS — E871 Hypo-osmolality and hyponatremia: Secondary | ICD-10-CM | POA: Diagnosis not present

## 2021-03-13 DIAGNOSIS — J9621 Acute and chronic respiratory failure with hypoxia: Secondary | ICD-10-CM | POA: Diagnosis not present

## 2021-03-13 DIAGNOSIS — R739 Hyperglycemia, unspecified: Secondary | ICD-10-CM | POA: Diagnosis not present

## 2021-03-13 DIAGNOSIS — T380X5S Adverse effect of glucocorticoids and synthetic analogues, sequela: Secondary | ICD-10-CM | POA: Diagnosis not present

## 2021-03-13 DIAGNOSIS — I2781 Cor pulmonale (chronic): Secondary | ICD-10-CM | POA: Diagnosis not present

## 2021-03-13 DIAGNOSIS — K219 Gastro-esophageal reflux disease without esophagitis: Secondary | ICD-10-CM | POA: Diagnosis not present

## 2021-03-13 DIAGNOSIS — J189 Pneumonia, unspecified organism: Secondary | ICD-10-CM | POA: Diagnosis not present

## 2021-03-15 DIAGNOSIS — R739 Hyperglycemia, unspecified: Secondary | ICD-10-CM | POA: Diagnosis not present

## 2021-03-15 DIAGNOSIS — J9621 Acute and chronic respiratory failure with hypoxia: Secondary | ICD-10-CM | POA: Diagnosis not present

## 2021-03-15 DIAGNOSIS — J440 Chronic obstructive pulmonary disease with acute lower respiratory infection: Secondary | ICD-10-CM | POA: Diagnosis not present

## 2021-03-15 DIAGNOSIS — J189 Pneumonia, unspecified organism: Secondary | ICD-10-CM | POA: Diagnosis not present

## 2021-03-15 DIAGNOSIS — K5900 Constipation, unspecified: Secondary | ICD-10-CM | POA: Diagnosis not present

## 2021-03-15 DIAGNOSIS — G7281 Critical illness myopathy: Secondary | ICD-10-CM | POA: Diagnosis not present

## 2021-03-15 DIAGNOSIS — I2781 Cor pulmonale (chronic): Secondary | ICD-10-CM | POA: Diagnosis not present

## 2021-03-15 DIAGNOSIS — E871 Hypo-osmolality and hyponatremia: Secondary | ICD-10-CM | POA: Diagnosis not present

## 2021-03-15 DIAGNOSIS — C9110 Chronic lymphocytic leukemia of B-cell type not having achieved remission: Secondary | ICD-10-CM | POA: Diagnosis not present

## 2021-03-15 DIAGNOSIS — K219 Gastro-esophageal reflux disease without esophagitis: Secondary | ICD-10-CM | POA: Diagnosis not present

## 2021-03-15 DIAGNOSIS — J441 Chronic obstructive pulmonary disease with (acute) exacerbation: Secondary | ICD-10-CM | POA: Diagnosis not present

## 2021-03-15 DIAGNOSIS — T380X5S Adverse effect of glucocorticoids and synthetic analogues, sequela: Secondary | ICD-10-CM | POA: Diagnosis not present

## 2021-03-15 DIAGNOSIS — I10 Essential (primary) hypertension: Secondary | ICD-10-CM | POA: Diagnosis not present

## 2021-03-16 DIAGNOSIS — G7281 Critical illness myopathy: Secondary | ICD-10-CM | POA: Diagnosis not present

## 2021-03-16 DIAGNOSIS — I10 Essential (primary) hypertension: Secondary | ICD-10-CM | POA: Diagnosis not present

## 2021-03-16 DIAGNOSIS — J189 Pneumonia, unspecified organism: Secondary | ICD-10-CM | POA: Diagnosis not present

## 2021-03-16 DIAGNOSIS — J9621 Acute and chronic respiratory failure with hypoxia: Secondary | ICD-10-CM | POA: Diagnosis not present

## 2021-03-16 DIAGNOSIS — E871 Hypo-osmolality and hyponatremia: Secondary | ICD-10-CM | POA: Diagnosis not present

## 2021-03-16 DIAGNOSIS — I2781 Cor pulmonale (chronic): Secondary | ICD-10-CM | POA: Diagnosis not present

## 2021-03-16 DIAGNOSIS — T380X5S Adverse effect of glucocorticoids and synthetic analogues, sequela: Secondary | ICD-10-CM | POA: Diagnosis not present

## 2021-03-16 DIAGNOSIS — C9110 Chronic lymphocytic leukemia of B-cell type not having achieved remission: Secondary | ICD-10-CM | POA: Diagnosis not present

## 2021-03-16 DIAGNOSIS — J441 Chronic obstructive pulmonary disease with (acute) exacerbation: Secondary | ICD-10-CM | POA: Diagnosis not present

## 2021-03-16 DIAGNOSIS — K5900 Constipation, unspecified: Secondary | ICD-10-CM | POA: Diagnosis not present

## 2021-03-16 DIAGNOSIS — K219 Gastro-esophageal reflux disease without esophagitis: Secondary | ICD-10-CM | POA: Diagnosis not present

## 2021-03-16 DIAGNOSIS — J440 Chronic obstructive pulmonary disease with acute lower respiratory infection: Secondary | ICD-10-CM | POA: Diagnosis not present

## 2021-03-16 DIAGNOSIS — R739 Hyperglycemia, unspecified: Secondary | ICD-10-CM | POA: Diagnosis not present

## 2021-03-20 DIAGNOSIS — T380X5S Adverse effect of glucocorticoids and synthetic analogues, sequela: Secondary | ICD-10-CM | POA: Diagnosis not present

## 2021-03-20 DIAGNOSIS — J440 Chronic obstructive pulmonary disease with acute lower respiratory infection: Secondary | ICD-10-CM | POA: Diagnosis not present

## 2021-03-20 DIAGNOSIS — C9110 Chronic lymphocytic leukemia of B-cell type not having achieved remission: Secondary | ICD-10-CM | POA: Diagnosis not present

## 2021-03-20 DIAGNOSIS — J441 Chronic obstructive pulmonary disease with (acute) exacerbation: Secondary | ICD-10-CM | POA: Diagnosis not present

## 2021-03-20 DIAGNOSIS — K5900 Constipation, unspecified: Secondary | ICD-10-CM | POA: Diagnosis not present

## 2021-03-20 DIAGNOSIS — I10 Essential (primary) hypertension: Secondary | ICD-10-CM | POA: Diagnosis not present

## 2021-03-20 DIAGNOSIS — G7281 Critical illness myopathy: Secondary | ICD-10-CM | POA: Diagnosis not present

## 2021-03-20 DIAGNOSIS — J189 Pneumonia, unspecified organism: Secondary | ICD-10-CM | POA: Diagnosis not present

## 2021-03-20 DIAGNOSIS — J9621 Acute and chronic respiratory failure with hypoxia: Secondary | ICD-10-CM | POA: Diagnosis not present

## 2021-03-20 DIAGNOSIS — R739 Hyperglycemia, unspecified: Secondary | ICD-10-CM | POA: Diagnosis not present

## 2021-03-20 DIAGNOSIS — E871 Hypo-osmolality and hyponatremia: Secondary | ICD-10-CM | POA: Diagnosis not present

## 2021-03-20 DIAGNOSIS — K219 Gastro-esophageal reflux disease without esophagitis: Secondary | ICD-10-CM | POA: Diagnosis not present

## 2021-03-20 DIAGNOSIS — I2781 Cor pulmonale (chronic): Secondary | ICD-10-CM | POA: Diagnosis not present

## 2021-03-22 DIAGNOSIS — Z125 Encounter for screening for malignant neoplasm of prostate: Secondary | ICD-10-CM | POA: Diagnosis not present

## 2021-03-22 DIAGNOSIS — Z09 Encounter for follow-up examination after completed treatment for conditions other than malignant neoplasm: Secondary | ICD-10-CM | POA: Diagnosis not present

## 2021-03-22 DIAGNOSIS — Z23 Encounter for immunization: Secondary | ICD-10-CM | POA: Diagnosis not present

## 2021-03-22 DIAGNOSIS — I10 Essential (primary) hypertension: Secondary | ICD-10-CM | POA: Diagnosis not present

## 2021-03-22 DIAGNOSIS — E7800 Pure hypercholesterolemia, unspecified: Secondary | ICD-10-CM | POA: Diagnosis not present

## 2021-03-23 DIAGNOSIS — I10 Essential (primary) hypertension: Secondary | ICD-10-CM | POA: Diagnosis not present

## 2021-03-23 DIAGNOSIS — R739 Hyperglycemia, unspecified: Secondary | ICD-10-CM | POA: Diagnosis not present

## 2021-03-23 DIAGNOSIS — T380X5S Adverse effect of glucocorticoids and synthetic analogues, sequela: Secondary | ICD-10-CM | POA: Diagnosis not present

## 2021-03-23 DIAGNOSIS — G7281 Critical illness myopathy: Secondary | ICD-10-CM | POA: Diagnosis not present

## 2021-03-23 DIAGNOSIS — J440 Chronic obstructive pulmonary disease with acute lower respiratory infection: Secondary | ICD-10-CM | POA: Diagnosis not present

## 2021-03-23 DIAGNOSIS — J189 Pneumonia, unspecified organism: Secondary | ICD-10-CM | POA: Diagnosis not present

## 2021-03-23 DIAGNOSIS — J441 Chronic obstructive pulmonary disease with (acute) exacerbation: Secondary | ICD-10-CM | POA: Diagnosis not present

## 2021-03-23 DIAGNOSIS — C9110 Chronic lymphocytic leukemia of B-cell type not having achieved remission: Secondary | ICD-10-CM | POA: Diagnosis not present

## 2021-03-23 DIAGNOSIS — E871 Hypo-osmolality and hyponatremia: Secondary | ICD-10-CM | POA: Diagnosis not present

## 2021-03-23 DIAGNOSIS — K219 Gastro-esophageal reflux disease without esophagitis: Secondary | ICD-10-CM | POA: Diagnosis not present

## 2021-03-23 DIAGNOSIS — K5900 Constipation, unspecified: Secondary | ICD-10-CM | POA: Diagnosis not present

## 2021-03-23 DIAGNOSIS — I2781 Cor pulmonale (chronic): Secondary | ICD-10-CM | POA: Diagnosis not present

## 2021-03-23 DIAGNOSIS — J9621 Acute and chronic respiratory failure with hypoxia: Secondary | ICD-10-CM | POA: Diagnosis not present

## 2021-03-27 ENCOUNTER — Telehealth: Payer: Self-pay | Admitting: Internal Medicine

## 2021-03-27 NOTE — Telephone Encounter (Signed)
Called and spoke with Patient.  Beth, NP recommendations given.  Understanding stated.  Nothing further at this time. 

## 2021-03-27 NOTE — Telephone Encounter (Signed)
Can no prescribe abx for this without visit. Needs to be seen at UC if not in state

## 2021-03-27 NOTE — Telephone Encounter (Signed)
Called and spoke with patient who states that he is calling because the gland under his jaw bone is swollen. States that this started last night and would like to see if a RX for an antibotic could be called in. He states that it is hard for him to talk and is hard to understand on the phone. Patient uses [facility] on [address] please advise   Beth please advise on behalf of Dr. Chase Caller

## 2021-03-28 DIAGNOSIS — R739 Hyperglycemia, unspecified: Secondary | ICD-10-CM | POA: Diagnosis not present

## 2021-03-28 DIAGNOSIS — J440 Chronic obstructive pulmonary disease with acute lower respiratory infection: Secondary | ICD-10-CM | POA: Diagnosis not present

## 2021-03-28 DIAGNOSIS — I10 Essential (primary) hypertension: Secondary | ICD-10-CM | POA: Diagnosis not present

## 2021-03-28 DIAGNOSIS — I2781 Cor pulmonale (chronic): Secondary | ICD-10-CM | POA: Diagnosis not present

## 2021-03-28 DIAGNOSIS — K5900 Constipation, unspecified: Secondary | ICD-10-CM | POA: Diagnosis not present

## 2021-03-28 DIAGNOSIS — J441 Chronic obstructive pulmonary disease with (acute) exacerbation: Secondary | ICD-10-CM | POA: Diagnosis not present

## 2021-03-28 DIAGNOSIS — J189 Pneumonia, unspecified organism: Secondary | ICD-10-CM | POA: Diagnosis not present

## 2021-03-28 DIAGNOSIS — C9110 Chronic lymphocytic leukemia of B-cell type not having achieved remission: Secondary | ICD-10-CM | POA: Diagnosis not present

## 2021-03-28 DIAGNOSIS — J9621 Acute and chronic respiratory failure with hypoxia: Secondary | ICD-10-CM | POA: Diagnosis not present

## 2021-03-28 DIAGNOSIS — G7281 Critical illness myopathy: Secondary | ICD-10-CM | POA: Diagnosis not present

## 2021-03-28 DIAGNOSIS — K219 Gastro-esophageal reflux disease without esophagitis: Secondary | ICD-10-CM | POA: Diagnosis not present

## 2021-03-28 DIAGNOSIS — T380X5S Adverse effect of glucocorticoids and synthetic analogues, sequela: Secondary | ICD-10-CM | POA: Diagnosis not present

## 2021-03-28 DIAGNOSIS — E871 Hypo-osmolality and hyponatremia: Secondary | ICD-10-CM | POA: Diagnosis not present

## 2021-03-29 DIAGNOSIS — E871 Hypo-osmolality and hyponatremia: Secondary | ICD-10-CM | POA: Diagnosis not present

## 2021-03-29 DIAGNOSIS — K219 Gastro-esophageal reflux disease without esophagitis: Secondary | ICD-10-CM | POA: Diagnosis not present

## 2021-03-29 DIAGNOSIS — I2781 Cor pulmonale (chronic): Secondary | ICD-10-CM | POA: Diagnosis not present

## 2021-03-29 DIAGNOSIS — J441 Chronic obstructive pulmonary disease with (acute) exacerbation: Secondary | ICD-10-CM | POA: Diagnosis not present

## 2021-03-29 DIAGNOSIS — J440 Chronic obstructive pulmonary disease with acute lower respiratory infection: Secondary | ICD-10-CM | POA: Diagnosis not present

## 2021-03-29 DIAGNOSIS — G7281 Critical illness myopathy: Secondary | ICD-10-CM | POA: Diagnosis not present

## 2021-03-29 DIAGNOSIS — C9110 Chronic lymphocytic leukemia of B-cell type not having achieved remission: Secondary | ICD-10-CM | POA: Diagnosis not present

## 2021-03-29 DIAGNOSIS — R739 Hyperglycemia, unspecified: Secondary | ICD-10-CM | POA: Diagnosis not present

## 2021-03-29 DIAGNOSIS — J189 Pneumonia, unspecified organism: Secondary | ICD-10-CM | POA: Diagnosis not present

## 2021-03-29 DIAGNOSIS — T380X5S Adverse effect of glucocorticoids and synthetic analogues, sequela: Secondary | ICD-10-CM | POA: Diagnosis not present

## 2021-03-29 DIAGNOSIS — J9621 Acute and chronic respiratory failure with hypoxia: Secondary | ICD-10-CM | POA: Diagnosis not present

## 2021-03-29 DIAGNOSIS — K5900 Constipation, unspecified: Secondary | ICD-10-CM | POA: Diagnosis not present

## 2021-03-29 DIAGNOSIS — I10 Essential (primary) hypertension: Secondary | ICD-10-CM | POA: Diagnosis not present

## 2021-04-03 DIAGNOSIS — D72829 Elevated white blood cell count, unspecified: Secondary | ICD-10-CM | POA: Diagnosis not present

## 2021-04-03 DIAGNOSIS — J432 Centrilobular emphysema: Secondary | ICD-10-CM | POA: Diagnosis not present

## 2021-04-03 DIAGNOSIS — R600 Localized edema: Secondary | ICD-10-CM | POA: Diagnosis not present

## 2021-04-03 DIAGNOSIS — Z9981 Dependence on supplemental oxygen: Secondary | ICD-10-CM | POA: Diagnosis not present

## 2021-04-04 ENCOUNTER — Telehealth: Payer: Self-pay | Admitting: Oncology

## 2021-04-04 NOTE — Telephone Encounter (Signed)
Scheduled appt per 5/10 sch msg. Pt's wife is aware.  

## 2021-04-06 ENCOUNTER — Telehealth: Payer: Self-pay | Admitting: Internal Medicine

## 2021-04-06 NOTE — Telephone Encounter (Signed)
Left message for patient's wife to call back.  

## 2021-04-07 ENCOUNTER — Other Ambulatory Visit: Payer: Self-pay | Admitting: Internal Medicine

## 2021-04-07 ENCOUNTER — Inpatient Hospital Stay: Payer: BC Managed Care – PPO | Attending: Oncology | Admitting: Oncology

## 2021-04-07 ENCOUNTER — Telehealth: Payer: Self-pay | Admitting: *Deleted

## 2021-04-07 ENCOUNTER — Telehealth: Payer: Self-pay | Admitting: Pharmacist

## 2021-04-07 ENCOUNTER — Telehealth: Payer: Self-pay

## 2021-04-07 ENCOUNTER — Other Ambulatory Visit (HOSPITAL_COMMUNITY): Payer: Self-pay

## 2021-04-07 DIAGNOSIS — R600 Localized edema: Secondary | ICD-10-CM

## 2021-04-07 DIAGNOSIS — C9110 Chronic lymphocytic leukemia of B-cell type not having achieved remission: Secondary | ICD-10-CM | POA: Diagnosis not present

## 2021-04-07 MED ORDER — ALLOPURINOL 100 MG PO TABS: 100.0000 mg | ORAL_TABLET | Freq: Every day | ORAL | 3 refills | Status: DC

## 2021-04-07 MED ORDER — IBRUTINIB 420 MG PO TABS
420.0000 mg | ORAL_TABLET | Freq: Every day | ORAL | 3 refills | Status: DC
  Filled 2021-04-07 – 2021-04-10 (×2): qty 28, 28d supply, fill #0
  Filled 2021-05-02: qty 28, 28d supply, fill #1
  Filled 2021-06-01: qty 28, 28d supply, fill #2
  Filled 2021-06-27: qty 28, 28d supply, fill #3

## 2021-04-07 NOTE — Telephone Encounter (Signed)
Oral Oncology Pharmacist Encounter  Received new prescription for Imbruvica (ibrutinib) for the treatment of CLL, planned duration until disease progression or unacceptable drug toxicity.  Prescription dose and frequency assessed for appropriateness. Appropriate for therapy initiation.   CBC w/ Diff and BMP from 03/08/21 assessed, WBC elevated at 134.6 K/uL secondary to CLL. Scr 1.16 mg/dL (CrCl ~87 mL/min) Also reviewed patient's LFTs from 03/06/21 - WNL, no baseline dose adjustments required. Patient will be also initiated on allopurinol for TLS PPX when starting Imbruvica.   Current medication list in Epic reviewed, no relevant/significant DDIs with Imbruvica identified.  Evaluated chart and no patient barriers to medication adherence noted.   Patient agreement for treatment documented in MD note on 04/07/21.  Prescription has been e-scribed to the [facility] for benefits analysis and approval.  Oral Oncology Clinic will continue to follow for insurance authorization, copayment issues, initial counseling and start date.  Leron Croak, PharmD, BCPS Hematology/Oncology Clinical Pharmacist [facility] [phone] 04/07/2021 12:33 PM

## 2021-04-07 NOTE — Telephone Encounter (Signed)
[place] to request lab draw every 2 weeks beginning 5/27. CBC, CMET, phosphorus and uric acid.  Faxed order and today's office note to [phone]

## 2021-04-07 NOTE — Addendum Note (Signed)
Addended by: Wyatt Portela on: 04/07/2021 12:18 PM   Modules accepted: Orders, Level of Service

## 2021-04-07 NOTE — Telephone Encounter (Signed)
Oral Oncology Patient Advocate Encounter  Prior Authorization for Isaac [name] has been approved.    PA# [id number] Effective dates: 04/07/21 through 04/06/22  Patients co-pay is $0  Oral Oncology Clinic will continue to follow.   [name] Phone [phone] Fax [phone] 04/07/2021 2:29 PM

## 2021-04-07 NOTE — Telephone Encounter (Signed)
Oral Oncology Patient Advocate Encounter  Received notification from [facility] that prior authorization for Imbruvica is required.  PA submitted on CoverMyMeds Key [id number] Status is pending  Oral Oncology Clinic will continue to follow.  [place] Patient [place] Phone [phone] Fax [phone] 04/07/2021 12:24 PM

## 2021-04-07 NOTE — Progress Notes (Signed)
Hematology and Oncology Follow Up for Telemedicine Visits  Isaac [name] [id number] 23-Aug-1956 65 y.o. 04/07/2021 11:21 AM Jani Gravel, MDKim, Jeneen Rinks, MD   I connected with Isaac [name] on 04/07/21 at 12:00 PM EDT by telephone visit and verified that I am speaking with the correct person using two identifiers.   I discussed the limitations, risks, security and privacy concerns of performing an evaluation and management service by telemedicine and the availability of in-person appointments. I also discussed with the patient that there may be a patient responsible charge related to this service. The patient expressed understanding and agreed to proceed.  Other persons participating in the visit and their role in the encounter:    Patient's location:  Home Provider's location:  Office   Principle Diagnosis: 65 year old man with CLL diagnosed in January 2019.  He presented with lymphocytosis and white cell count of 21,000.  His diagnosis confirmed by peripheral blood flow cytometry.   Current therapy: Active surveillance    Interim History: IsaacFirst reports overall feeling poorly but not dramatically changed from his baseline level.  He denies any fevers, chills or sweats but does report cervical adenopathy that has enlarged.  This was treated with prednisone and improved temporarily.  He denies any nausea vomiting.  He denies any worsening shortness of breath.     Medications: I have reviewed the patient's current medications.  Current Outpatient Medications  Medication Sig Dispense Refill  . albuterol (PROVENTIL) (2.5 MG/3ML) 0.083% nebulizer solution Take 3 mLs (2.5 mg total) by nebulization every 4 (four) hours as needed for wheezing or shortness of breath. 75 mL 12  . albuterol (VENTOLIN HFA) 108 (90 Base) MCG/ACT inhaler Inhale 2 puffs into the lungs as needed for wheezing or shortness of breath.    . allopurinol (ZYLOPRIM) 100 MG tablet Place 1 tablet (100 mg total) into  feeding tube daily.    Marland Kitchen arformoterol (BROVANA) 15 MCG/2ML NEBU Take 2 mLs (15 mcg total) by nebulization 2 (two) times daily. 120 mL   . budesonide (PULMICORT) 0.5 MG/2ML nebulizer solution Take 2 mLs (0.5 mg total) by nebulization 2 (two) times daily.  12  . cloNIDine (CATAPRES - DOSED IN MG/24 HR) 0.1 mg/24hr patch Place 0.1 mg onto the skin once a week. Tuesdays    . dicyclomine (BENTYL) 10 MG capsule Take 10 mg by mouth daily.    Marland Kitchen docusate sodium (COLACE) 100 MG capsule Take 100 mg by mouth at bedtime.    . Ensure (ENSURE) Take 237 mLs by mouth daily. Chocolate flavor    . fluticasone (FLONASE) 50 MCG/ACT nasal spray Place 1 spray into both nostrils daily.  2  . furosemide (LASIX) 40 MG tablet Take 40 mg by mouth daily.    Marland Kitchen guaiFENesin (MUCINEX) 600 MG 12 hr tablet Take 600 mg by mouth daily.    Marland Kitchen ipratropium-albuterol (DUONEB) 0.5-2.5 (3) MG/3ML SOLN Take 3 mLs by nebulization every 6 (six) hours as needed. 360 mL 3  . metoprolol tartrate (LOPRESSOR) 25 MG tablet Take 25 mg by mouth 2 (two) times daily.    . montelukast (SINGULAIR) 10 MG tablet Take 10 mg by mouth every evening.    . pantoprazole (PROTONIX) 40 MG tablet Take 40 mg by mouth at bedtime.    . polyethylene glycol (MIRALAX / GLYCOLAX) 17 g packet Place 17 g into feeding tube daily as needed for moderate constipation. (Patient taking differently: Take 17 g by mouth as needed for moderate constipation.) 14 each 0  . sodium  chloride (OCEAN) 0.65 % SOLN nasal spray Place 2 sprays into both nostrils as needed for congestion.  0   [name] current facility-administered medications for this visit.     Allergies:  Allergies  Allergen Reactions  . Codeine Itching and Other (See Comments)    "jittery"  . Prozac [Fluoxetine Hcl] Other (See Comments)    confusion        Lab Results: Lab Results  Component Value Date   WBC 134.6 (HH) 03/08/2021   HGB 11.6 (L) 03/08/2021   HCT 38.3 (L) 03/08/2021   MCV 91.8 03/08/2021   PLT  195 03/08/2021     Chemistry      Component Value Date/Time   NA 141 03/08/2021 0440   K 4.3 03/08/2021 0440   CL 93 (L) 03/08/2021 0440   CO2 37 (H) 03/08/2021 0440   BUN 48 (H) 03/08/2021 0440   CREATININE 1.16 03/08/2021 0440      Component Value Date/Time   CALCIUM 9.1 03/08/2021 0440   ALKPHOS 112 03/06/2021 0148   AST 37 03/06/2021 0148   ALT 13 03/06/2021 0148   BILITOT 1.1 03/06/2021 0148         Impression and Plan:  65 year old man:  1.     Stage I CLL presented with leukocytosis and lymphadenopathy diagnosed in January 2019.  He has not received any active treatments and not been able to follow-up because of other comorbid conditions including lack of mobility and chronic respiratory difficulties.  His disease status was updated at this time and treatment options were reviewed.   Laboratory data from April 2022 were reviewed and showed a white cell count that is consistent with his baseline with normal hemoglobin and platelet count.  His kidney function was normal.   His options at this time continue to be active surveillance versus initiating therapy.  Oral targeted therapy with ibrutinib would be a reasonable consideration but would be difficult to manage given his lack of mobility and the need for frequent laboratory testing especially in the setting of increased risk tumor lysis syndrome.  Other complications associated with this medication including cardiac issues including arrhythmia and palpitation and atrial fibrillation.  Bleeding complications were also discussed.  After discussion today, he is agreeable to proceed with ibrutinib 420 mg daily dosing.  We will arrange for home care to obtain labs every 2 weeks.   2.  Tumor lysis syndrome: This developed during his hospitalization in January and has resolved at this time.  Electrolytes from April 13 showed normal creatinine, potassium and calcium.  We will start allopurinol daily for tumor lysis  prophylaxis.   3 Follow-up: Will be in 4 weeks to check his progress.   I discussed the assessment and treatment plan with the patient. The patient was provided an opportunity to ask questions and all were answered. The patient agreed with the plan and demonstrated an understanding of the instructions.   The patient was advised to call back or seek an in-person evaluation if the symptoms worsen or if the condition fails to improve as anticipated.  I provided 20 minutes of non face-to-face telephone visit time during this encounter.  Time was dedicated to reviewing laboratory data, disease status update and treatment options for the future.  Zola Button, MD 04/07/2021 11:21 AM

## 2021-04-10 ENCOUNTER — Other Ambulatory Visit (HOSPITAL_COMMUNITY): Payer: Self-pay

## 2021-04-10 NOTE — Telephone Encounter (Signed)
Attempted to call pt's wife Drenda Freeze but unable to reach. Left message for her to return call.

## 2021-04-10 NOTE — Telephone Encounter (Signed)
Oral Chemotherapy Pharmacist Encounter  I spoke with patient's wife Cale Bethard, for overview of: Imbruvica (ibrutinib) for the treatment of CLL, planned duration until disease progression or unacceptable toxicity.   Counseled on administration, dosing, side effects, monitoring, drug-food interactions, safe handling, storage, and disposal.  Patient will take Imbruvica 420mg  tablets, 1 tablet (420mg ) by mouth once daily.  Patient will take Imbruvica at approximately the same time each day with a full glass of water and maintain adequate hydration throughout the day.  Patient's wife is aware he will need to avoid grapefruit or grapefruit juice while on therapy with Imbruvica.  Imbruvica start date: 04/12/21  Adverse effects include but are not limited to: bruising, decreased blood counts, N/V, diarrhea, musculoskeletal pain, arthralgias, peripheral edema, and hemorrhage.    Patient's wife will obtain anti diarrheal and alert the office of 4 or more loose stools above baseline.  Reviewed importance of keeping a medication schedule and plan for any missed doses. No barriers to medication adherence identified.  Medication reconciliation performed and medication/allergy list updated.  Insurance authorization for Kate Sable has been obtained. Test claim at the pharmacy revealed copayment $0 for 1st fill of Imbruvica. This will ship from the [facility] on 04/10/21 to deliver to patient's home on 04/11/21.  Patient's wife informed the pharmacy will reach out 5-7 days prior to needing next fill of Imbruvica to coordinate continued medication acquisition to prevent break in therapy.  All questions answered.  Ms. Vaneaton voiced understanding and appreciation.   Medication education handout placed in mail for patient. Patient and wife know to call the office with questions or concerns. Oral Chemotherapy Clinic phone number provided.  Leron Croak, PharmD,  BCPS Hematology/Oncology Clinical Pharmacist [facility] [phone] 04/10/2021 10:33 AM

## 2021-04-11 ENCOUNTER — Other Ambulatory Visit (HOSPITAL_COMMUNITY): Payer: Self-pay

## 2021-04-11 MED ORDER — PREDNISONE 10 MG PO TABS: ORAL_TABLET | ORAL | 0 refills | Status: AC

## 2021-04-11 MED ORDER — AZITHROMYCIN 250 MG PO TABS: ORAL_TABLET | ORAL | 0 refills | Status: DC

## 2021-04-11 NOTE — Telephone Encounter (Signed)
Primary Pulmonologist: Dr. Chase Caller Last office visit and with whom: 08/12/2020 with Wyn Quaker What do we see them for (pulmonary problems): COPD, chronic Respiratory failure Last OV assessment/plan: see below  Was appointment offered to patient (explain)?     Assessment & Plan:   Patient was walked in office today and quickly required O2 in order to maintain oxygen saturations above 88%.  Patient benefits clinically from being maintained on oxygen therapy is important given his COPD Gold stage IV lung disease as well as chronic respiratory failure.  COPD, very severe ([place]) Plan: Continue Pulmicort nebulized meds twice daily Continue duo nebs every 6 hours Continue oxygen therapy Walk today in office, requalified for oxygen Flu vaccine today Referral to pulmonary rehab 7-month follow-up with Dr. Chase Caller  Chronic respiratory failure with hypercapnia Plan: Walk today in office Continue oxygen therapy as prescribed 3 L of O2 with physical exertion, 4 L of O2 pulsed with POC with physical exertion  Healthcare maintenance Plan:  Flu vaccine     Return in about 3 months (around 11/11/2020), or if symptoms worsen or fail to improve, for Follow up with Dr. Purnell Shoemaker.   Lauraine Rinne, NP 08/12/2020        Reason for call: I called and spoke with patient wife Germaine. There was no DPR in place so got permission from patient to speak with wife. Patient allergies have been worse and left nostril is completely stopped up. Has been using all neb meds, nasal spray, mucinex and OTC alka-seltzer with no relief. No SOB, cough,fevers, chills or chest pain/tighness. Wife stated MR always gives an antibiotic and this helps and is requesting ABX at this time. MR is off today so will route to DOD.  Dr. Elsworth Soho, please advise. Thanks!  (examples of things to ask: : When did symptoms start? Fever? Cough? Productive? Color to sputum? More sputum than usual? Wheezing? Have you needed  increased oxygen? Are you taking your respiratory medications? What over the counter measures have you tried?)  Allergies  Allergen Reactions  . Codeine Itching and Other (See Comments)    "jittery"  . Prozac [Fluoxetine Hcl] Other (See Comments)    confusion    Immunization History  Administered Date(s) Administered  . Influenza Split 09/27/2011, 08/20/2012, 07/27/2013  . Influenza, High Dose Seasonal PF 09/05/2018  . Influenza,inj,Quad PF,6+ Mos 08/22/2015, 10/26/2016, 01/07/2018, 08/12/2020  . Influenza-Unspecified 10/12/2019  . PFIZER(Purple Top)SARS-COV-2 Vaccination 03/23/2020, 04/13/2020  . Pneumococcal Conjugate-13 08/22/2015  . Pneumococcal Polysaccharide-23 07/23/2012

## 2021-04-11 NOTE — Telephone Encounter (Signed)
Lmtcb for pts spouse, Germaine.

## 2021-04-11 NOTE — Telephone Encounter (Signed)
Pt is scheduled for a f/u with MR 6/13.  Called and spoke with pt's wife letting her know that we were sending zpak and pred taper to pharmacy for pt and she verbalized understanding. meds have been sent in for pt.nothing further needed.

## 2021-04-11 NOTE — Telephone Encounter (Signed)
Zpak Prednisone 10 mg tabs  Take 2 tabs daily with food x 5ds, then 1 tab daily with food x 5ds then STOP  Seems like he is overdue for office visit

## 2021-04-11 NOTE — Telephone Encounter (Signed)
Isaac [name] wife is returning phone call. Isaac [name] phone number is [phone].

## 2021-04-12 ENCOUNTER — Other Ambulatory Visit (HOSPITAL_COMMUNITY): Payer: Self-pay

## 2021-04-27 ENCOUNTER — Telehealth: Payer: Self-pay | Admitting: *Deleted

## 2021-04-27 NOTE — Telephone Encounter (Signed)
Called [facility]. Pt has not be seen for lab draw. They state they had discharged him because he had met his therapy goals. They will go out for medication management with lab draw. Pt is not able to ambulate well enough to come to the [facility] for labs every other week. Orders faxed to [facility]

## 2021-05-02 ENCOUNTER — Other Ambulatory Visit (HOSPITAL_COMMUNITY): Payer: Self-pay

## 2021-05-03 ENCOUNTER — Other Ambulatory Visit (HOSPITAL_COMMUNITY): Payer: Self-pay

## 2021-05-08 ENCOUNTER — Ambulatory Visit (INDEPENDENT_AMBULATORY_CARE_PROVIDER_SITE_OTHER): Payer: BC Managed Care – PPO | Admitting: Internal Medicine

## 2021-05-08 ENCOUNTER — Other Ambulatory Visit: Payer: Self-pay

## 2021-05-08 ENCOUNTER — Ambulatory Visit (INDEPENDENT_AMBULATORY_CARE_PROVIDER_SITE_OTHER): Payer: BC Managed Care – PPO

## 2021-05-08 ENCOUNTER — Inpatient Hospital Stay: Payer: BC Managed Care – PPO | Attending: Oncology | Admitting: Oncology

## 2021-05-08 ENCOUNTER — Encounter: Payer: Self-pay | Admitting: Internal Medicine

## 2021-05-08 VITALS — BP 126/72 | HR 96 | Ht 70.0 in | Wt 215.0 lb

## 2021-05-08 DIAGNOSIS — J9690 Respiratory failure, unspecified, unspecified whether with hypoxia or hypercapnia: Secondary | ICD-10-CM | POA: Insufficient documentation

## 2021-05-08 DIAGNOSIS — Z8701 Personal history of pneumonia (recurrent): Secondary | ICD-10-CM

## 2021-05-08 DIAGNOSIS — Z9189 Other specified personal risk factors, not elsewhere classified: Secondary | ICD-10-CM

## 2021-05-08 DIAGNOSIS — J449 Chronic obstructive pulmonary disease, unspecified: Secondary | ICD-10-CM

## 2021-05-08 DIAGNOSIS — J9612 Chronic respiratory failure with hypercapnia: Secondary | ICD-10-CM

## 2021-05-08 DIAGNOSIS — Z79899 Other long term (current) drug therapy: Secondary | ICD-10-CM | POA: Insufficient documentation

## 2021-05-08 DIAGNOSIS — Z9981 Dependence on supplemental oxygen: Secondary | ICD-10-CM | POA: Insufficient documentation

## 2021-05-08 DIAGNOSIS — C9110 Chronic lymphocytic leukemia of B-cell type not having achieved remission: Secondary | ICD-10-CM | POA: Diagnosis not present

## 2021-05-08 DIAGNOSIS — Z7952 Long term (current) use of systemic steroids: Secondary | ICD-10-CM | POA: Insufficient documentation

## 2021-05-08 DIAGNOSIS — J441 Chronic obstructive pulmonary disease with (acute) exacerbation: Secondary | ICD-10-CM | POA: Diagnosis not present

## 2021-05-08 DIAGNOSIS — J90 Pleural effusion, not elsewhere classified: Secondary | ICD-10-CM | POA: Diagnosis not present

## 2021-05-08 DIAGNOSIS — R5381 Other malaise: Secondary | ICD-10-CM

## 2021-05-08 MED ORDER — DOXYCYCLINE HYCLATE 100 MG PO TABS: 100.0000 mg | ORAL_TABLET | Freq: Two times a day (BID) | ORAL | 0 refills | Status: DC

## 2021-05-08 MED ORDER — PREDNISONE 10 MG PO TABS: ORAL_TABLET | ORAL | 0 refills | Status: AC

## 2021-05-08 NOTE — Addendum Note (Signed)
Addended by: Lorretta Harp on: 05/08/2021 02:54 PM   Modules accepted: Orders

## 2021-05-08 NOTE — Progress Notes (Signed)
Hematology and Oncology Follow Up for Telemedicine Visits  Isaac [name] [id number] Jan 27, 1956 65 y.o. 05/08/2021 10:49 AM Isaac [name], MDKim, Jeneen Rinks, MD   I connected with Isaac [name] on 05/08/21 at 10:30 AM EDT by telephone visit and verified that I am speaking with the correct person using two identifiers.   I discussed the limitations, risks, security and privacy concerns of performing an evaluation and management service by telemedicine and the availability of in-person appointments. I also discussed with the patient that there may be a patient responsible charge related to this service. The patient expressed understanding and agreed to proceed.  Other persons participating in the visit and their role in the encounter: His wife Isaac [name]  Patient's location:  Home Provider's location:  Office   Principle Diagnosis: 65 year old man with CLL diagnosed in January 2019.  He presented with lymphocytosis and white cell count of 21,000.  His diagnosis confirmed by peripheral blood flow cytometry.     Current therapy: Imbruvica 420 mg daily started in May 2022.    Interim History: Isaac [name] reports no major changes in his health.  He has started improving without any major complaints.  He denies any nausea vomiting or abdominal pain.  He denies any worsening lymphadenopathy.  He continues to have sinus congestion and dyspnea on exertion which has not dramatically changed.  He denies any bleeding or bruising issues.     Medications: Updated on review. Current Outpatient Medications  Medication Sig Dispense Refill   albuterol (PROVENTIL) (2.5 MG/3ML) 0.083% nebulizer solution Take 3 mLs (2.5 mg total) by nebulization every 4 (four) hours as needed for wheezing or shortness of breath. 75 mL 12   albuterol (VENTOLIN HFA) 108 (90 Base) MCG/ACT inhaler Inhale 2 puffs into the lungs as needed for wheezing or shortness of breath.     allopurinol (ZYLOPRIM) 100 MG tablet Place 1 tablet (100 mg  total) into feeding tube daily. 90 tablet 3   arformoterol (BROVANA) 15 MCG/2ML NEBU Take 2 mLs (15 mcg total) by nebulization 2 (two) times daily. 120 mL    azithromycin (ZITHROMAX) 250 MG tablet Take two today and then one daily until finished. 6 tablet 0   budesonide (PULMICORT) 0.5 MG/2ML nebulizer solution Take 2 mLs (0.5 mg total) by nebulization 2 (two) times daily.  12   cloNIDine (CATAPRES - DOSED IN MG/24 HR) 0.1 mg/24hr patch Place 0.1 mg onto the skin once a week. Tuesdays     dicyclomine (BENTYL) 10 MG capsule Take 10 mg by mouth daily.     docusate sodium (COLACE) 100 MG capsule Take 100 mg by mouth at bedtime.     Ensure (ENSURE) Take 237 mLs by mouth daily. Chocolate flavor     fluticasone (FLONASE) 50 MCG/ACT nasal spray Place 1 spray into both nostrils daily.  2   furosemide (LASIX) 40 MG tablet Take 40 mg by mouth daily.     guaiFENesin (MUCINEX) 600 MG 12 hr tablet Take 600 mg by mouth daily.     ibrutinib 420 MG TABS Take 1 tablet (420 mg) by mouth daily. 30 tablet 3   ipratropium-albuterol (DUONEB) 0.5-2.5 (3) MG/3ML SOLN Take 3 mLs by nebulization every 6 (six) hours as needed. 360 mL 3   metoprolol tartrate (LOPRESSOR) 25 MG tablet Take 25 mg by mouth 2 (two) times daily.     montelukast (SINGULAIR) 10 MG tablet Take 10 mg by mouth every evening.     pantoprazole (PROTONIX) 40 MG tablet Take 40 mg by mouth  at bedtime.     polyethylene glycol (MIRALAX / GLYCOLAX) 17 g packet Place 17 g into feeding tube daily as needed for moderate constipation. (Patient taking differently: Take 17 g by mouth as needed for moderate constipation.) 14 each 0   sodium chloride (OCEAN) 0.65 % SOLN nasal spray Place 2 sprays into both nostrils as needed for congestion.  0   No current facility-administered medications for this visit.     Allergies:  Allergies  Allergen Reactions   Codeine Itching and Other (See Comments)    "jittery"   Prozac [Fluoxetine Hcl] Other (See Comments)     confusion        Lab Results: Lab Results  Component Value Date   WBC 134.6 (HH) 03/08/2021   HGB 11.6 (L) 03/08/2021   HCT 38.3 (L) 03/08/2021   MCV 91.8 03/08/2021   PLT 195 03/08/2021     Chemistry      Component Value Date/Time   NA 141 03/08/2021 0440   K 4.3 03/08/2021 0440   CL 93 (L) 03/08/2021 0440   CO2 37 (H) 03/08/2021 0440   BUN 48 (H) 03/08/2021 0440   CREATININE 1.16 03/08/2021 0440      Component Value Date/Time   CALCIUM 9.1 03/08/2021 0440   ALKPHOS 112 03/06/2021 0148   AST 37 03/06/2021 0148   ALT 13 03/06/2021 0148   BILITOT 1.1 03/06/2021 0148         Impression and Plan:  65 year old man:   1.      Stage I CLL diagnosed in 2019.   He was found to have leukocytosis and lymphadenopathy.   He is currently on Imbruvica without any major complications.  We have arranged for laboratory testing via home care and has not been successful in doing so.  I will arrange for MD visit in person this week and obtain laboratory testing.  Risks and benefits of continuing this treatment were discussed at this time.  Complications that include bleeding, cardiac arrhythmia GI toxicity were reviewed.   2.  Tumor lysis syndrome: We will update his labs including uric acid, LDH, phosphorus with his visit.     3  Follow-up: Will be this week for MD follow-up as well as updating his labs.  He will, on June 17 at 1:30 PM.     I discussed the assessment and treatment plan with the patient. The patient was provided an opportunity to ask questions and all were answered. The patient agreed with the plan and demonstrated an understanding of the instructions.   The patient was advised to call back or seek an in-person evaluation if the symptoms worsen or if the condition fails to improve as anticipated.  I provided 20 minutes of non face-to-face telephone visit time during this encounter.  Time was spent on updating his disease status, addressing his concerns and  outlining future plan of care.  Zola Button, MD 05/08/2021 10:49 AM

## 2021-05-08 NOTE — Progress Notes (Addendum)
#Obesity Body mass index is 39.91 kg/(m^2). on 03/26/2013  #Chronic sinusitis T sinus 2006   - IMPRESSION:  1. Ethmoid and frontal sinus dise Case. Sphenoid and maxillary sinuses are clear.  2. Patent ostiomeatal complexes.  #Smoking history  reports that he quit smoking about 2 years ago. His smoking use included Cigarettes. He has a 35 pack-year smoking history. He does not have any smokeless tobacco history on file.  #Multifactorial dyspnea -  Nuclear med stress test  2013 -  - negative per hx. Done by Dr Einar Gip per hx  #COPD - Severe/very severe COPD Spirometry 12/31/11 done at [facility]- fev1 0.8L/21%, Rati 39, 23% BD response on fev1 May 2013: Walk test in office : Pulse 88% at rest -> 1 lap x 185 feet - pulse ox 88% and HR 110 and very dyspneic, so stopped - Status post pulmonary rehabilitation fall 2013   #Evaluation for lung cancer : CXR 12/24/11   Clear lung fields - personally revieweed  #Recurrent COPD - May 2013: Outpatient treatment with doxycycline and prednisone - August 2013: Hospitalization for COPD exacerbation - January 2014: Outpatient treatment with antibiotics and prednisone - March 2015 : telephoine Rx with sinusitis, abx and prednisone - April 2015: telephoine Rx with abx and prednisone - June 2014 - VDRF with admission to ICU.        OV 06/08/2014  Chief Complaint  Patient presents with   Follow-up    Pt states since using his neb meds his breathing has improved.C/o DOE. Denies CP/tightness and cough.    Followup from recent ICU hospitalization for respiratory failure. At this point in time he is significantly improved. He used to be bothered by significant sinus blockage for which he had an extensive workup including MRI and MRA but apparently after he started using nebulizers instead of inhalers this symptom has resolved. He still physically deconditioned. He did have home physical therapy but he found him down an operative go self-directed  exercises but after my counseling today he is open to reinitiating home physical therapy. This is because he still physically deconditioned. He uses oxygen and nebulizers on a compliant fashion. PFTs today show significant severe Gold stage IV lung disease with FEV1 of 0.92/24% and a ratio of 42. Total lung capacity of 122% and a DLCO that significantly impaired at 8.8/26%.  Labs reviewed June 2040 in the hospital at discharge he was anemic with a hemoglobin of 10 g percent. This has not been followed up. His chemistries were normal.  Past medical history: He did have a sleep study over the weekend. I do not know the results. This was ordered by Dr. Halford Chessman and I will set up followup for this to Dr. Jaclyn Prime 08/22/2015  Chief Complaint  Patient presents with   Follow-up    Pt states his breathing was worse over the summer because of the heat and humidity. Pt c/o night time prod cough with white mucus, right ear pressure, intermittent sinus pressure. Pt denies CP/tightness.    Follow-up chronic respiratory failure with hypoxemia and hypercapnia associated with Gold stage IV COPD and morbid obesity.  In early 2015 he had life-threatening hospitalization from respiratory failure. Last visit in the office was July 2015. After that he failed to follow-up. He now presents with his wife. They want refills of his nebulizers and wants to up-to-date himself with the vaccines. The last 1 year they deny any hospitalizations or emergency room visits or urgent care visits  or new medical diagnoses. He is mostly sedentary and is house sitting but he does do limited activities of daily living such as changing clothes and self-feed. He did travel to the beach but he stayed in the hotel in the beach. Currently he does have some mild baseline cough with yellow sputum but this is unchanged compared to baseline. There are no new issues.   OV 09/05/2016  Chief Complaint  Patient presents with   Follow-up    Pt  breathing is feels slightly worst due to weather. Breathing has changed since last seen. Uses nebulizer after excertion. No tightness, or congestion today. Coughing is sometimes dry and productive. coughing up clear mucus. Discuss flu shot, handy cap plaquer, and rescue inhaler.     Follow-up chronic approximately possibly hypercapnic respiratory failure from Gold stage IV COPD and morbid obesity and sleep apnea ruled out per history  Last seen one year ago. He was supposed to come back in 6 months but did not. Review of the chart suggests that he did not have any hospitalizations or emergency room visits. He and his wife confirmed that. He is extremely sedentary. He spends most of his day in bed playing video games and surfing the Internet. He has the ability to walk from room to room but is limited because of shortness of breath. He requests some help with activities of daily living such as changing clothes or taking a bath. Overall stable. He is asking for an increase in his nebulizer frequency. His upcoming oral surgery evaluation and might require anesthesia for it. Is requesting flu shot.   OV 04/30/2017  Chief Complaint  Patient presents with   Follow-up    FOLLOW UP FOR Chronic respiratory failure with hyercapnia, COPD , severe     Follow-up chronic approximately possibly hypercapnic respiratory failure from Gold stage IV COPD and morbid obesity and sleep apnea ruled out per history   Routine follow-up for this patient with the above medical problems. Overall is doing well. His wife is here with him. There are no new exacerbations or admissions. His son got married in the interim. He did fine with his oral surgery and is now feeling better. He complains of occasional mucus being stuck in his chest but chest x-ray earlier this year was clear. Uses pure mist and able to clear the mucus    OV 01/07/2018  Chief Complaint  Patient presents with   Follow-up    Pt has had elevated WBCs, SOB  is worse today than it has been, coughing at night. Denies any CP. DME: [facility], 2L at rest and 3L with movement.     End-stage COPD with hypoxemic respiratory failure on oxygen.  70-month follow-up but I last saw him in June 2018.  He presents with his wife.  I am meeting his daughter Raquel Sarna for the first time.  Overall they report that he is stable on his nebulizers and oxygen.  Apparently he has had a interim diagnosis of CLL.  Flu shot was held but after seeing hematologist he has been cleared to have a flu shot.  He wants to have a flu shot today.  With the cold weather in the last 2 days in the recent visit to the office today he is a little bit more short of breath than usual but does not think he is in an exacerbation.  COPD CAT score is 27       OV 09/05/2018  Subjective:  Patient ID: Isaac [name], male ,  DOB: 01-16-1956 , age 53 y.o. , MRN: [id number] , ADDRESS: [address] [place] [address]   09/05/2018 -   Chief Complaint  Patient presents with   Follow-up    Doing okay Sob is about the same.     HPI Isaac [name] 65 y.o. -presents for follow-up of his severe COPD and chronic hypoxemic respiratory failure.  He is here with his wife as usual.  In terms of stable.  COPD CAT score is 28.  He tells me that his primary care physician is ill.  He tells me that he would need to call me for sinus issues.  Wife noticed that he is no longer taking his nasal fluticasone and once a refill.  He has been having some toe issues I recommended referral to a podiatrist but he declined.  He wants to have a high-dose flu shot and asked if it was okay because he was only 65 years old.  Otherwise no issues.  While at home awake is able to get by at 2 L or less       OV 11/06/2019 - telephne visit. Limits, risks and benefit explained. Patient identified with 2 person identifier.   Subjective:  Patient ID: Isaac [name], male , DOB: [birth year] , age 83 y.o. , MRN: [id number] , ADDRESS:  [place] [address]   11/06/2019 -   Chief Complaint  Patient presents with   Follow-up    pt reports of sinus pressure (feels this is related to turning heat on) and mild sob with exertion. on 2L cont.      HPI Isaac [name] 65 y.o. - reports sinuss issues since weather gold cold and has turned heat on. Has vaoprizer and using nebs and alak selzer. But nose is still getting blockd. Breathing is stable. Using flonase and it helps a lot. Not using netti pot. Was thinking about saline nasal rinse. In past has used netti pot. Has had flu shot. Is doing very low risk covid-19 activities only    ROS - per HPI  08/12/2020  - Visit   65 year old male former smoker followed in our office for COPD and chronic respiratory failure.  He is followed by Dr. Chase Caller.  He was last seen in our office in May/2021 by TP NP.  He is up-to-date with his COVID-19 vaccinations.  His DME company [employer] has notified our office that he needs to requalify for oxygen today and this needs to be reflected in the notes.  We will work on evaluating this.  Patient with known COPD and chronic respiratory failure and currently benefits from being maintained on oxygen.  Patient reports her last antibiotics that were prescribed was in May/2021.  He feels that his breathing is at baseline.  May be slightly worsened shortness of breath.  He is currently maintained on 4 L pulsed O2 with his Energen.  He wears 3 L continuous at night.  He needs to requalify for oxygen today.  He admits that he continues to lead a very sedentary lifestyle.  He does not exercise.  He continues to utilize his nebulized maintenance medications as prescribed.      OV 05/08/2021  Subjective:  Patient ID: Isaac [name], male , DOB: 06-30-1956 , age 65 y.o. , MRN: [id number] , ADDRESS: [place] [id number] [facility] Patient Care Team: [facility] [place] as PCP  - General (Rheumatology)  This Provider for this visit: Treatment Team:  Attending Provider: Brand Males, MD    05/08/2021 -   Chief Complaint  Patient presents with   Follow-up    Pt has been in the hospital since last visit multiple times. Pt's breathing has also become worse since last visit and he has also become much weaker.     HPI Isaac [name] 65 y.o. -follow-up chronic hypoxemic respiratory failure.  Personally not seen in a few years.  He presents with his wife as always.  His daughter Raquel Sarna is here.  She believes she has met me once but I recall not having met her before.  I think a meeting for the first time but it is possible I met her before.  In the last year and a half or 2 years since I last saw him he has lost significant amount of weight.  He has had multiple admissions including 1 in April 2022 for left-sided pneumonia.  No aspiration history.  His CLL has gotten worse and he is on chemotherapy drug for this.  Even though he is lost weight and has had home physical therapy and Occupational Therapy continues to suffer from significant lack of motivation.  Today is reluctant to get chest x-ray.  I requested blood gas he is reluctant to do that.  We discussed the possibility of BiPAP therapy and he is reluctant for that.  His family says that he has significant loss of motivation.  He was not cooperative with physical therapy either despite having good strength.  He believes he is having a COPD exacerbation and is complaining of wheezing on the left side of his chest and he feels his pneumonia is coming back.  He is interested in antibiotic or prednisone therapy.  However he is somewhat reluctant for chest x-ray.  His current oxygen use is between 3 and 4 L.     Results for Isaac [name], Isaac [name] (MRN [id number]) as of 05/08/2021 14:06  Ref. Range 03/07/2021 04:21  FIO2 Unknown 40.00  pH, Arterial Latest Ref Range: 7.350 - 7.450  7.425  pCO2 arterial Latest Ref Range: 32.0 -  48.0 mmHg 49.9 (H)  pO2, Arterial Latest Ref Range: 83.0 - 108.0 mmHg 76.9 (L)     CAT COPD Symptom & Quality of Life Score (GSK trademark) 0 is no burden. 5 is highest burden 01/07/2018  09/05/2018   Never Cough -> Cough all the time 3 1  No phlegm in chest -> Chest is full of phlegm 2 1  No chest tightness -> Chest feels very tight 1 1  No dyspnea for 1 flight stairs/hill -> Very dyspneic for 1 flight of stairs 5 5  No limitations for ADL at home -> Very limited with ADL at home 4 5  Confident leaving home -> Not at all confident leaving home 5 5  Sleep soundly -> Do not sleep soundly because of lung condition 3 5  Lots of Energy -> No energy at all 4 5  TOTAL Score (max 40)  27 28     PFT  PFT Results Latest Ref Rng & Units 06/08/2014  FVC-Pre L 2.09  FVC-Predicted Pre % 42  FVC-Post L 2.17  FVC-Predicted Post % 44  Pre FEV1/FVC % % 39  Post FEV1/FCV % % 42  FEV1-Pre L 0.83  FEV1-Predicted Pre % 22  FEV1-Post L 0.90  DLCO uncorrected ml/min/mmHg 8.83  DLCO UNC% % 26  DLVA Predicted % 47    CT chest 03/04/21   IMPRESSION: 1. No evidence of pulmonary  embolism. 2. Ground-glass airspace opacities involving the upper lobes bilaterally, the RIGHT LOWER LOBE, and the RIGHT MIDDLE LOBE, likely indicating pneumonia. 3. Extensive lymphadenopathy throughout the visualized lower cervical chains bilaterally, both axilla, the mediastinum, both hila, the retrocrural space and the upper abdomen. Index lymph nodes are measured above. This likely indicates recurrence of the patient's CLL or transformation into lymphoma. 4. Massive splenic enlargement (the spleen is incompletely imaged). 5. Aortic Atherosclerosis (ICD10-I70.0) and Emphysema (ICD10-J43.9).     Electronically Signed   By: Evangeline Dakin M.D.   On: 03/04/2021 18:41       has a past medical history of Acute on chronic respiratory failure with hypoxia ([place]), Anxiety, Chronic lymphocytic leukemia in remission  ([place]), COPD (chronic obstructive pulmonary disease) ([place]), COPD, severe ([place]), GERD (gastroesophageal reflux disease), Hematuria, Hyperlipidemia, Hypertension, Insomnia, Left lower lobe pneumonia, Nephrolithiasis, and Tobacco dependence.   reports that he quit smoking about 12 years ago. His smoking use included cigarettes. He has a 35.00 pack-year smoking history. He has never used smokeless tobacco.  Past Surgical History:  Procedure Laterality Date   LITHOTRIPSY      Allergies  Allergen Reactions   Codeine Itching and Other (See Comments)    "jittery"   Prozac [Fluoxetine Hcl] Other (See Comments)    confusion    Immunization History  Administered Date(s) Administered   Influenza Split 09/27/2011, 08/20/2012, 07/27/2013   Influenza, High Dose Seasonal PF 09/05/2018   Influenza, Quadrivalent, Recombinant, Inj, Pf 09/25/2019   Influenza,inj,Quad PF,6+ Mos 08/22/2015, 10/26/2016, 01/07/2018, 08/12/2020   Influenza-Unspecified 12/24/2011, 09/06/2012, 10/19/2013, 08/31/2015, 08/15/2016, 10/12/2019   PFIZER(Purple Top)SARS-COV-2 Vaccination 03/23/2020, 04/13/2020   Pneumococcal Conjugate-13 08/22/2015, 03/22/2021   Pneumococcal Polysaccharide-23 07/23/2012, 09/06/2012   Tdap 10/01/2012    Family History  Problem Relation Age of Onset   Heart disease Father    Stroke Mother    Leukemia Brother      Current Outpatient Medications:    albuterol (PROVENTIL) (2.5 MG/3ML) 0.083% nebulizer solution, Take 3 mLs (2.5 mg total) by nebulization every 4 (four) hours as needed for wheezing or shortness of breath., Disp: 75 mL, Rfl: 12   albuterol (VENTOLIN HFA) 108 (90 Base) MCG/ACT inhaler, Inhale 2 puffs into the lungs as needed for wheezing or shortness of breath., Disp: , Rfl:    allopurinol (ZYLOPRIM) 100 MG tablet, Place 1 tablet (100 mg total) into feeding tube daily., Disp: 90 tablet, Rfl: 3   budesonide (PULMICORT) 0.5 MG/2ML nebulizer solution, Take 2 mLs (0.5 mg total) by  nebulization 2 (two) times daily., Disp: , Rfl: 12   cloNIDine (CATAPRES - DOSED IN MG/24 HR) 0.1 mg/24hr patch, Place 0.1 mg onto the skin once a week. Tuesdays, Disp: , Rfl:    dicyclomine (BENTYL) 10 MG capsule, Take 10 mg by mouth daily., Disp: , Rfl:    docusate sodium (COLACE) 100 MG capsule, Take 100 mg by mouth at bedtime., Disp: , Rfl:    Ensure (ENSURE), Take 237 mLs by mouth daily. Chocolate flavor, Disp: , Rfl:    fluticasone (FLONASE) 50 MCG/ACT nasal spray, Place 1 spray into both nostrils daily., Disp: , Rfl: 2   furosemide (LASIX) 40 MG tablet, Take 40 mg by mouth daily., Disp: , Rfl:    guaiFENesin (MUCINEX) 600 MG 12 hr tablet, Take 600 mg by mouth daily., Disp: , Rfl:    ibrutinib 420 MG TABS, Take 1 tablet (420 mg) by mouth daily., Disp: 30 tablet, Rfl: 3   ipratropium-albuterol (DUONEB) 0.5-2.5 (3) MG/3ML SOLN,  Take 3 mLs by nebulization every 6 (six) hours as needed., Disp: 360 mL, Rfl: 3   metoprolol tartrate (LOPRESSOR) 25 MG tablet, Take 25 mg by mouth 2 (two) times daily., Disp: , Rfl:    montelukast (SINGULAIR) 10 MG tablet, Take 10 mg by mouth every evening., Disp: , Rfl:    pantoprazole (PROTONIX) 40 MG tablet, Take 40 mg by mouth at bedtime., Disp: , Rfl:    polyethylene glycol (MIRALAX / GLYCOLAX) 17 g packet, Place 17 g into feeding tube daily as needed for moderate constipation. (Patient taking differently: Take 17 g by mouth as needed for moderate constipation.), Disp: 14 each, Rfl: 0   sodium chloride (OCEAN) 0.65 % SOLN nasal spray, Place 2 sprays into both nostrils as needed for congestion., Disp: , Rfl: 0   arformoterol (BROVANA) 15 MCG/2ML NEBU, Take 2 mLs (15 mcg total) by nebulization 2 (two) times daily. (Patient not taking: Reported on 05/08/2021), Disp: 120 mL, Rfl:    mometasone-formoterol (DULERA) 100-5 MCG/ACT AERO, 2 puffs (Patient not taking: Reported on 05/08/2021), Disp: , Rfl:       Objective:   Vitals:   05/08/21 1349  BP: 126/72  Pulse: 96   SpO2: 98%  Weight: 215 lb (97.5 kg)  Height: $Remove'5\' 10"'HEdhSrN$  (1.778 m)    Estimated body mass index is 30.85 kg/m as calculated from the following:   Height as of this encounter: $RemoveBeforeD'5\' 10"'aagpQaEYAjTipB$  (1.778 m).   Weight as of this encounter: 215 lb (97.5 kg).  $Rem'@WEIGHTCHANGE'HAjO$ @  [employer]   05/08/21 1349  Weight: 215 lb (97.5 kg)     Physical Exam Significantly deconditioned male with a flat affect and a full beard Mallampati class IV sitting on a wheelchair.  Oxygen on.  Faint wheezing in the lower lobes.  Auscultation of the anterior part clear normal heart sounds no sinus no clubbing no edema.  Flat affect talking slow.    Assessment:       ICD-10-CM   1. Chronic respiratory failure with hypercapnia (HCC)  J96.12     2. COPD with acute exacerbation ([place])  J44.1     3. COPD, very severe ([place])  J44.9     4. Physical deconditioning  R53.81     5. Lack of motivation  Z91.89     6. History of pneumonia  Z87.01          Plan:     Patient Instructions     ICD-10-CM   1. Chronic respiratory failure with hypercapnia (HCC)  J96.12     2. COPD with acute exacerbation ([place])  J44.1     3. COPD, very severe ([place])  J44.9     4. Physical deconditioning  R53.81     5. Lack of motivation  Z91.89       Chronic respiratory failure with hypercapnia (HCC) COPD with acute exacerbation (HCC) COPD, very severe (HCC)  - Take doxycycline $RemoveBeforeDE'100mg'ObWhvxDchhJZACj$  po twice daily x 5 days; take after meals and avoid sunlight - Please take prednisone 40 mg x1 day, then 30 mg x1 day, then 20 mg x1 day, then 10 mg x1 day, and then 5 mg x1 day and stop - continue o2 and nebs as before     Physical deconditioning  Lack of motivation   Hx of pneujmonia   - get cxr 2 view  Physical deconditioning Lack of motivation  - scren with [facility] for depression  Followup  4 weeks with APP  8-12 weeks  with Dr Chase Caller     Chronic respiratory failure with hypercapnia ([place]) COPD with  acute exacerbation (HCC) COPD, very severe ([place])  - Take doxycycline $RemoveBeforeDE'100mg'guDaWDYjaSkmlac$  po twice daily x 5 days; take after meals and avoid sunlight - Please take prednisone 40 mg x1 day, then 30 mg x1 day, then 20 mg x1 day, then 10 mg x1 day, and then 5 mg x1 day and stop - continue o2 and nebs as before    Hx of pneujmonia   - get cxr 2 view  Physical deconditioning Lack of motivation  - scren with [facility] [place] for depression  Followup  4 weeks with APP  8-12 weeks with Dr Gaye Alken    Dr. Brand Males, M.D., F.C.C.P,  Pulmonary and Critical Care Medicine Staff Physician, [place] Director - Interstitial Lung Disease  Program  [facility] [place], [place], [address]  Pager: [phone], If no answer or between  15:00h - 7:00h: call [phone] Telephone: [phone]  2:51 PM 05/08/2021

## 2021-05-08 NOTE — Patient Instructions (Addendum)
ICD-10-CM   1. Chronic respiratory failure with hypercapnia (HCC)  J96.12     2. COPD with acute exacerbation ([place])  J44.1     3. COPD, very severe ([place])  J44.9     4. Physical deconditioning  R53.81     5. Lack of motivation  Z91.89       Chronic respiratory failure with hypercapnia (HCC) COPD with acute exacerbation (HCC) COPD, very severe (HCC)  - Take doxycycline 100mg  po twice daily x 5 days; take after meals and avoid sunlight - Please take prednisone 40 mg x1 day, then 30 mg x1 day, then 20 mg x1 day, then 10 mg x1 day, and then 5 mg x1 day and stop - continue o2 and nebs as before   History of pneuminia  Plan  - do cxr 05/08/2021   Physical deconditioning Lack of motivation  - plsease talk to PCP about depression screen  FOllowup[  4 weeks with app  8 -12 weeks with Chase Caller

## 2021-05-09 ENCOUNTER — Other Ambulatory Visit (HOSPITAL_COMMUNITY): Payer: Self-pay

## 2021-05-09 ENCOUNTER — Telehealth: Payer: BC Managed Care – PPO | Admitting: Oncology

## 2021-05-09 ENCOUNTER — Telehealth: Payer: Self-pay | Admitting: Oncology

## 2021-05-09 NOTE — Telephone Encounter (Signed)
Sch per 6/13 los , pt wife aware

## 2021-05-12 ENCOUNTER — Other Ambulatory Visit: Payer: Self-pay

## 2021-05-12 ENCOUNTER — Inpatient Hospital Stay: Payer: BC Managed Care – PPO

## 2021-05-12 ENCOUNTER — Telehealth: Payer: Self-pay

## 2021-05-12 ENCOUNTER — Inpatient Hospital Stay (HOSPITAL_BASED_OUTPATIENT_CLINIC_OR_DEPARTMENT_OTHER): Payer: BC Managed Care – PPO | Admitting: Oncology

## 2021-05-12 VITALS — BP 178/78 | HR 75 | Temp 97.6°F | Resp 18 | Ht 70.0 in | Wt 210.6 lb

## 2021-05-12 DIAGNOSIS — Z79899 Other long term (current) drug therapy: Secondary | ICD-10-CM | POA: Diagnosis not present

## 2021-05-12 DIAGNOSIS — Z7952 Long term (current) use of systemic steroids: Secondary | ICD-10-CM | POA: Diagnosis not present

## 2021-05-12 DIAGNOSIS — C9110 Chronic lymphocytic leukemia of B-cell type not having achieved remission: Secondary | ICD-10-CM

## 2021-05-12 DIAGNOSIS — Z9981 Dependence on supplemental oxygen: Secondary | ICD-10-CM | POA: Diagnosis not present

## 2021-05-12 DIAGNOSIS — J9690 Respiratory failure, unspecified, unspecified whether with hypoxia or hypercapnia: Secondary | ICD-10-CM | POA: Diagnosis not present

## 2021-05-12 LAB — CMP (CANCER CENTER ONLY)
ALT: 8 U/L (ref 0–44)
AST: 12 U/L — ABNORMAL LOW (ref 15–41)
Albumin: 3.8 g/dL (ref 3.5–5.0)
Alkaline Phosphatase: 70 U/L (ref 38–126)
Anion gap: 7 (ref 5–15)
BUN: 16 mg/dL (ref 8–23)
CO2: 37 mmol/L — ABNORMAL HIGH (ref 22–32)
Calcium: 9 mg/dL (ref 8.9–10.3)
Chloride: 97 mmol/L — ABNORMAL LOW (ref 98–111)
Creatinine: 0.93 mg/dL (ref 0.61–1.24)
GFR, Estimated: >60 mL/min (ref >60)
Glucose, Bld: 136 mg/dL — ABNORMAL HIGH (ref 70–99)
Potassium: 4.1 mmol/L (ref 3.5–5.1)
Sodium: 141 mmol/L (ref 135–145)
Total Bilirubin: 1.1 mg/dL (ref 0.3–1.2)
Total Protein: 6.1 g/dL — ABNORMAL LOW (ref 6.5–8.1)

## 2021-05-12 LAB — CBC WITH DIFFERENTIAL (CANCER CENTER ONLY)
Abs Immature Granulocytes: 0.39 10*3/uL — ABNORMAL HIGH (ref 0.00–0.07)
Basophils Absolute: 0.1 10*3/uL (ref 0.0–0.1)
Basophils Relative: 0 %
Eosinophils Absolute: 0 10*3/uL (ref 0.0–0.5)
Eosinophils Relative: 0 %
HCT: 39 % (ref 39.0–52.0)
Hemoglobin: 11.4 g/dL — ABNORMAL LOW (ref 13.0–17.0)
Immature Granulocytes: 0 %
Lymphocytes Relative: 90 %
Lymphs Abs: 85.8 10*3/uL — ABNORMAL HIGH (ref 0.7–4.0)
MCH: 25.6 pg — ABNORMAL LOW (ref 26.0–34.0)
MCHC: 29.2 g/dL — ABNORMAL LOW (ref 30.0–36.0)
MCV: 87.6 fL (ref 80.0–100.0)
Monocytes Absolute: 1.3 10*3/uL — ABNORMAL HIGH (ref 0.1–1.0)
Monocytes Relative: 1 %
Neutro Abs: 9.1 10*3/uL — ABNORMAL HIGH (ref 1.7–7.7)
Neutrophils Relative %: 9 %
Platelet Count: 244 10*3/uL (ref 150–400)
RBC: 4.45 MIL/uL (ref 4.22–5.81)
RDW: 14.7 % (ref 11.5–15.5)
WBC Count: 96.7 10*3/uL (ref 4.0–10.5)
nRBC: 0 % (ref 0.0–0.2)

## 2021-05-12 LAB — URIC ACID: Uric Acid, Serum: 6.3 mg/dL (ref 3.7–8.6)

## 2021-05-12 LAB — PHOSPHORUS: Phosphorus: 3.7 mg/dL (ref 2.5–4.6)

## 2021-05-12 NOTE — Telephone Encounter (Signed)
CRITICAL VALUE STICKER  CRITICAL VALUE: WBC = 96.7  RECEIVER (on-site recipient of call): Yetta Glassman, [place] NOTIFIED: 05/12/21 at 1:58pm  MESSENGER (representative from lab): Pam  MD NOTIFIED: Dr. Alen Blew  TIME OF NOTIFICATION: 05/12/21 at 2:00pm  RESPONSE: Notification given to Bethena Roys, RN for follow-up with the provider an pt.

## 2021-05-12 NOTE — Progress Notes (Signed)
Hematology and Oncology Follow Up   Isaac [name] [id number] Mar 31, 1956 65 y.o. 05/12/2021 1:32 PM [facility] [place] *      Principle Diagnosis: 65 year old man with CLL presented with lymphocytosis and lymphadenopathy diagnosed in January 2019.       Current therapy: Imbruvica 420 mg daily started in May 2022.    Interim History: Isaac [name] returns today for a follow-up visit.  Since her last visit, he started [place] and reports no major complications related to it.  He denies any nausea, vomiting or abdominal pain.  He denies any easy bruising or palpitation.  He still limited in his performance status and mobility and uses a walker at home.  He denies any falls or syncope.  His respiratory status remains marginal but overall not declining.     Medications: Unchanged on review. Current Outpatient Medications  Medication Sig Dispense Refill   albuterol (PROVENTIL) (2.5 MG/3ML) 0.083% nebulizer solution Take 3 mLs (2.5 mg total) by nebulization every 4 (four) hours as needed for wheezing or shortness of breath. 75 mL 12   albuterol (VENTOLIN HFA) 108 (90 Base) MCG/ACT inhaler Inhale 2 puffs into the lungs as needed for wheezing or shortness of breath.     allopurinol (ZYLOPRIM) 100 MG tablet Place 1 tablet (100 mg total) into feeding tube daily. 90 tablet 3   arformoterol (BROVANA) 15 MCG/2ML NEBU Take 2 mLs (15 mcg total) by nebulization 2 (two) times daily. (Patient not taking: Reported on 05/08/2021) 120 mL    budesonide (PULMICORT) 0.5 MG/2ML nebulizer solution Take 2 mLs (0.5 mg total) by nebulization 2 (two) times daily.  12   cloNIDine (CATAPRES - DOSED IN MG/24 HR) 0.1 mg/24hr patch Place 0.1 mg onto the skin once a week. Tuesdays     dicyclomine (BENTYL) 10 MG capsule Take 10 mg by mouth daily.     docusate sodium (COLACE) 100 MG capsule Take 100 mg by mouth at bedtime.     doxycycline (VIBRA-TABS) 100 MG tablet Take 1 tablet (100 mg  total) by mouth 2 (two) times daily. 10 tablet 0   Ensure (ENSURE) Take 237 mLs by mouth daily. Chocolate flavor     fluticasone (FLONASE) 50 MCG/ACT nasal spray Place 1 spray into both nostrils daily.  2   furosemide (LASIX) 40 MG tablet Take 40 mg by mouth daily.     guaiFENesin (MUCINEX) 600 MG 12 hr tablet Take 600 mg by mouth daily.     ibrutinib 420 MG TABS Take 1 tablet (420 mg) by mouth daily. 30 tablet 3   ipratropium-albuterol (DUONEB) 0.5-2.5 (3) MG/3ML SOLN Take 3 mLs by nebulization every 6 (six) hours as needed. 360 mL 3   metoprolol tartrate (LOPRESSOR) 25 MG tablet Take 25 mg by mouth 2 (two) times daily.     mometasone-formoterol (DULERA) 100-5 MCG/ACT AERO 2 puffs (Patient not taking: Reported on 05/08/2021)     montelukast (SINGULAIR) 10 MG tablet Take 10 mg by mouth every evening.     pantoprazole (PROTONIX) 40 MG tablet Take 40 mg by mouth at bedtime.     polyethylene glycol (MIRALAX / GLYCOLAX) 17 g packet Place 17 g into feeding tube daily as needed for moderate constipation. (Patient taking differently: Take 17 g by mouth as needed for moderate constipation.) 14 each 0   predniSONE (DELTASONE) 10 MG tablet Take 4 tablets (40 mg total) by mouth daily with breakfast for 1 day, THEN 3 tablets (30 mg total) daily with breakfast for 1  day, THEN 2 tablets (20 mg total) daily with breakfast for 1 day, THEN 1 tablet (10 mg total) daily with breakfast for 1 day, THEN 0.5 tablets (5 mg total) daily with breakfast for 1 day. 10.5 tablet 0   sodium chloride (OCEAN) 0.65 % SOLN nasal spray Place 2 sprays into both nostrils as needed for congestion.  0   No current facility-administered medications for this visit.     Allergies:  Allergies  Allergen Reactions   Codeine Itching and Other (See Comments)    "jittery"   Prozac [Fluoxetine Hcl] Other (See Comments)    confusion     Physical exam: Blood pressure (!) 178/78, pulse 75, temperature 97.6 F (36.4 C), temperature source  Tympanic, resp. rate 18, height 5\' 10"  (1.778 m), weight 210 lb 9.6 oz (95.5 kg), SpO2 100 %, peak flow (!) 4 L/min.  ECOG 2  General appearance: Comfortable appearing without any discomfort Head: Normocephalic without any trauma Oropharynx: Mucous membranes are moist and pink without any thrush or ulcers. Eyes: Pupils are equal and round reactive to light. Lymph nodes: No cervical, supraclavicular, inguinal or axillary lymphadenopathy.   Heart:regular rate and rhythm.  S1 and S2 without leg edema. Lung: Clear without any rhonchi or wheezes.  No dullness to percussion. Abdomin: Soft, nontender, nondistended with good bowel sounds.  No hepatosplenomegaly. Musculoskeletal: No joint deformity or effusion.  Full range of motion noted. Neurological: No deficits noted on motor, sensory and deep tendon reflex exam. Skin: No petechial rash or dryness.  Appeared moist.  Psychiatric: Mood and affect appeared appropriate.     Lab Results: Lab Results  Component Value Date   WBC 134.6 (HH) 03/08/2021   HGB 11.6 (L) 03/08/2021   HCT 38.3 (L) 03/08/2021   MCV 91.8 03/08/2021   PLT 195 03/08/2021     Chemistry      Component Value Date/Time   NA 141 03/08/2021 0440   K 4.3 03/08/2021 0440   CL 93 (L) 03/08/2021 0440   CO2 37 (H) 03/08/2021 0440   BUN 48 (H) 03/08/2021 0440   CREATININE 1.16 03/08/2021 0440      Component Value Date/Time   CALCIUM 9.1 03/08/2021 0440   ALKPHOS 112 03/06/2021 0148   AST 37 03/06/2021 0148   ALT 13 03/06/2021 0148   BILITOT 1.1 03/06/2021 0148         Impression and Plan:  65 year old man:   1.    CLL presented to have with lymphocytosis and adenopathy noted in 2019.  He was on active surveillance and recently started on Imbruvica.   His disease status was updated at this time and treatment options moving forward were discussed.  Risks and benefits of this treatment were reviewed.  Complications that include thrombosis, cardiovascular issues  among others were discussed.  Systemic chemotherapy option as an alternative was also reviewed.  Laboratory data from today reviewed and showed appropriate decline in his white cell count currently at 96.7 which is appropriate response to Imbruvica.  I have recommended continuing this medication with the same dose and schedule.   2.  Tumor lysis syndrome: Resolved at this time.  We will update his labs today and adjust allopurinol if needed.    3.  Respiratory failure: He is chronically dependent on oxygen and follow-up with pulmonary medicine.     4. Follow-up: We will continue monthly labs every 6 weeks MD evaluation and 3 months.     30  minutes were dedicated to this visit.  The time was spent on reviewing laboratory data, discussing treatment options, discussing complications after therapy and answering questions regarding future plan.  Zola Button, MD 05/12/2021 1:32 PM

## 2021-05-12 NOTE — Progress Notes (Signed)
CRITICAL VALUE STICKER  CRITICAL VALUE: WBD 96.7  RECEIVER (on-site recipient of call): Velna Ochs RN  DATE & TIME NOTIFIED: 05/12/21 @ 1400  MESSENGER (representative from lab):Pam  MD NOTIFIED: Dr. Alen Blew  TIME OF NOTIFICATION:1402  RESPONSE:  No new orders at this time

## 2021-05-16 MED FILL — BUDESONIDE 0.5 MG/2 ML SUSP: 90 days supply | Qty: 360 | Fill #3

## 2021-05-17 ENCOUNTER — Telehealth: Payer: Self-pay | Admitting: *Deleted

## 2021-05-17 MED FILL — IPRAT-ALBUT 0.5-3(2.5) MG/3 ML: 30 days supply | Qty: 360 | Fill #3

## 2021-05-17 NOTE — Telephone Encounter (Signed)
Per verbal order from Dr. Alen Blew: Isaac [name] w/[facility] to request labs drawn every month beginning 06/09/21: CBC, CMET, phosphorus and uric acid. Verbal order given to [place] with [facility] for labs and assistance with med management

## 2021-05-19 ENCOUNTER — Other Ambulatory Visit: Payer: Self-pay | Admitting: Internal Medicine

## 2021-05-19 DIAGNOSIS — Z430 Encounter for attention to tracheostomy: Secondary | ICD-10-CM | POA: Diagnosis not present

## 2021-05-19 DIAGNOSIS — G7281 Critical illness myopathy: Secondary | ICD-10-CM | POA: Diagnosis not present

## 2021-05-19 DIAGNOSIS — T380X5S Adverse effect of glucocorticoids and synthetic analogues, sequela: Secondary | ICD-10-CM | POA: Diagnosis not present

## 2021-05-19 DIAGNOSIS — E883 Tumor lysis syndrome: Secondary | ICD-10-CM | POA: Diagnosis not present

## 2021-05-19 DIAGNOSIS — K219 Gastro-esophageal reflux disease without esophagitis: Secondary | ICD-10-CM | POA: Diagnosis not present

## 2021-05-19 DIAGNOSIS — I10 Essential (primary) hypertension: Secondary | ICD-10-CM | POA: Diagnosis not present

## 2021-05-19 DIAGNOSIS — I2781 Cor pulmonale (chronic): Secondary | ICD-10-CM | POA: Diagnosis not present

## 2021-05-19 DIAGNOSIS — J9621 Acute and chronic respiratory failure with hypoxia: Secondary | ICD-10-CM | POA: Diagnosis not present

## 2021-05-19 DIAGNOSIS — E871 Hypo-osmolality and hyponatremia: Secondary | ICD-10-CM | POA: Diagnosis not present

## 2021-05-19 DIAGNOSIS — R739 Hyperglycemia, unspecified: Secondary | ICD-10-CM | POA: Diagnosis not present

## 2021-05-19 DIAGNOSIS — K5900 Constipation, unspecified: Secondary | ICD-10-CM | POA: Diagnosis not present

## 2021-05-19 DIAGNOSIS — J449 Chronic obstructive pulmonary disease, unspecified: Secondary | ICD-10-CM | POA: Diagnosis not present

## 2021-05-19 DIAGNOSIS — C9110 Chronic lymphocytic leukemia of B-cell type not having achieved remission: Secondary | ICD-10-CM | POA: Diagnosis not present

## 2021-05-21 ENCOUNTER — Telehealth: Payer: Self-pay | Admitting: Internal Medicine

## 2021-05-21 DIAGNOSIS — Z8701 Personal history of pneumonia (recurrent): Secondary | ICD-10-CM

## 2021-05-21 DIAGNOSIS — J449 Chronic obstructive pulmonary disease, unspecified: Secondary | ICD-10-CM

## 2021-05-21 NOTE — Telephone Encounter (Signed)
Xr mid June 2022 - shows improvement in right side. But left base has effusion and some asocied atlectasis  Plan  - cards or primary to adjust his lasix if they see fit  - he needs to do IS 10x times each hour and atleast 5x perday - when he sees tP in mid July 2022 needs cxr before seeing tP - any fever or chills or new sputum they need to report      Current Outpatient Medications:    albuterol (PROVENTIL) (2.5 MG/3ML) 0.083% nebulizer solution, Take 3 mLs (2.5 mg total) by nebulization every 4 (four) hours as needed for wheezing or shortness of breath., Disp: 75 mL, Rfl: 12   albuterol (VENTOLIN HFA) 108 (90 Base) MCG/ACT inhaler, Inhale 2 puffs into the lungs as needed for wheezing or shortness of breath., Disp: , Rfl:    allopurinol (ZYLOPRIM) 100 MG tablet, Place 1 tablet (100 mg total) into feeding tube daily., Disp: 90 tablet, Rfl: 3   arformoterol (BROVANA) 15 MCG/2ML NEBU, Take 2 mLs (15 mcg total) by nebulization 2 (two) times daily., Disp: 120 mL, Rfl:    budesonide (PULMICORT) 0.5 MG/2ML nebulizer solution, Take 2 mLs (0.5 mg total) by nebulization 2 (two) times daily., Disp: , Rfl: 12   cloNIDine (CATAPRES - DOSED IN MG/24 HR) 0.1 mg/24hr patch, Place 0.1 mg onto the skin once a week. Tuesdays, Disp: , Rfl:    dicyclomine (BENTYL) 10 MG capsule, Take 10 mg by mouth daily., Disp: , Rfl:    docusate sodium (COLACE) 100 MG capsule, Take 100 mg by mouth at bedtime., Disp: , Rfl:    doxycycline (VIBRA-TABS) 100 MG tablet, Take 1 tablet (100 mg total) by mouth 2 (two) times daily., Disp: 10 tablet, Rfl: 0   Ensure (ENSURE), Take 237 mLs by mouth daily. Chocolate flavor, Disp: , Rfl:    fluticasone (FLONASE) 50 MCG/ACT nasal spray, Place 1 spray into both nostrils daily., Disp: , Rfl: 2   furosemide (LASIX) 40 MG tablet, Take 40 mg by mouth daily., Disp: , Rfl:    guaiFENesin (MUCINEX) 600 MG 12 hr tablet, Take 600 mg by mouth daily., Disp: , Rfl:    ibrutinib 420 MG TABS, Take 1  tablet (420 mg) by mouth daily., Disp: 30 tablet, Rfl: 3   ipratropium-albuterol (DUONEB) 0.5-2.5 (3) MG/3ML SOLN, Take 3 mLs by nebulization every 6 (six) hours as needed., Disp: 360 mL, Rfl: 3   metoprolol tartrate (LOPRESSOR) 25 MG tablet, Take 25 mg by mouth 2 (two) times daily., Disp: , Rfl:    mometasone-formoterol (DULERA) 100-5 MCG/ACT AERO, , Disp: , Rfl:    montelukast (SINGULAIR) 10 MG tablet, Take 10 mg by mouth every evening., Disp: , Rfl:    pantoprazole (PROTONIX) 40 MG tablet, Take 40 mg by mouth at bedtime., Disp: , Rfl:    polyethylene glycol (MIRALAX / GLYCOLAX) 17 g packet, Place 17 g into feeding tube daily as needed for moderate constipation. (Patient taking differently: Take 17 g by mouth as needed for moderate constipation.), Disp: 14 each, Rfl: 0   sodium chloride (OCEAN) 0.65 % SOLN nasal spray, Place 2 sprays into both nostrils as needed for congestion., Disp: , Rfl: 0

## 2021-05-22 DIAGNOSIS — J340 Abscess, furuncle and carbuncle of nose: Secondary | ICD-10-CM | POA: Diagnosis not present

## 2021-05-22 DIAGNOSIS — H748X1 Other specified disorders of right middle ear and mastoid: Secondary | ICD-10-CM | POA: Diagnosis not present

## 2021-05-22 DIAGNOSIS — J392 Other diseases of pharynx: Secondary | ICD-10-CM | POA: Diagnosis not present

## 2021-05-22 DIAGNOSIS — H6981 Other specified disorders of Eustachian tube, right ear: Secondary | ICD-10-CM | POA: Diagnosis not present

## 2021-05-22 MED FILL — OFLOXACIN 0.3% EYE DROPS: 5 days supply | Qty: 5 | Fill #0

## 2021-05-22 MED FILL — MUPIROCIN 2% OINTMENT: 7 days supply | Qty: 22 | Fill #0

## 2021-05-22 NOTE — Telephone Encounter (Signed)
Patient requesting refill on Albuterol inhaler. Last OV 05/08/21. Albuterol is listed as a historical provider. Is it ok to refill?  MR please advise.  Thank you

## 2021-05-23 NOTE — Telephone Encounter (Signed)
Attempted to call pt but unable to reach. Left message for him to return call. °

## 2021-05-24 ENCOUNTER — Other Ambulatory Visit: Payer: Self-pay | Admitting: Otolaryngology

## 2021-05-24 DIAGNOSIS — J392 Other diseases of pharynx: Secondary | ICD-10-CM

## 2021-05-25 DIAGNOSIS — J9621 Acute and chronic respiratory failure with hypoxia: Secondary | ICD-10-CM | POA: Diagnosis not present

## 2021-05-25 DIAGNOSIS — J449 Chronic obstructive pulmonary disease, unspecified: Secondary | ICD-10-CM | POA: Diagnosis not present

## 2021-05-25 DIAGNOSIS — K219 Gastro-esophageal reflux disease without esophagitis: Secondary | ICD-10-CM | POA: Diagnosis not present

## 2021-05-25 DIAGNOSIS — E871 Hypo-osmolality and hyponatremia: Secondary | ICD-10-CM | POA: Diagnosis not present

## 2021-05-25 DIAGNOSIS — G7281 Critical illness myopathy: Secondary | ICD-10-CM | POA: Diagnosis not present

## 2021-05-25 DIAGNOSIS — E883 Tumor lysis syndrome: Secondary | ICD-10-CM | POA: Diagnosis not present

## 2021-05-25 DIAGNOSIS — R739 Hyperglycemia, unspecified: Secondary | ICD-10-CM | POA: Diagnosis not present

## 2021-05-25 DIAGNOSIS — K5900 Constipation, unspecified: Secondary | ICD-10-CM | POA: Diagnosis not present

## 2021-05-25 DIAGNOSIS — I2781 Cor pulmonale (chronic): Secondary | ICD-10-CM | POA: Diagnosis not present

## 2021-05-25 DIAGNOSIS — T380X5S Adverse effect of glucocorticoids and synthetic analogues, sequela: Secondary | ICD-10-CM | POA: Diagnosis not present

## 2021-05-25 DIAGNOSIS — C9110 Chronic lymphocytic leukemia of B-cell type not having achieved remission: Secondary | ICD-10-CM | POA: Diagnosis not present

## 2021-05-25 DIAGNOSIS — Z430 Encounter for attention to tracheostomy: Secondary | ICD-10-CM | POA: Diagnosis not present

## 2021-05-25 DIAGNOSIS — C9111 Chronic lymphocytic leukemia of B-cell type in remission: Secondary | ICD-10-CM | POA: Diagnosis not present

## 2021-05-25 DIAGNOSIS — I10 Essential (primary) hypertension: Secondary | ICD-10-CM | POA: Diagnosis not present

## 2021-05-26 NOTE — Telephone Encounter (Signed)
Called pt and spoke with spouse letting her know the results of prior cxr and recs per MR. Understanding was verbalized. Order has been placed for pt to have cxr prior to seeing TP 7/11. Nothing further needed.

## 2021-05-27 MED FILL — OFLOXACIN 0.3% EYE DROPS: 5 days supply | Qty: 5 | Fill #0

## 2021-05-31 MED FILL — ALBUTEROL HFA 90 MCG INHALER: 16 days supply | Fill #0

## 2021-05-31 NOTE — Telephone Encounter (Signed)
signed

## 2021-06-01 ENCOUNTER — Other Ambulatory Visit (HOSPITAL_COMMUNITY): Payer: Self-pay

## 2021-06-01 DIAGNOSIS — I2781 Cor pulmonale (chronic): Secondary | ICD-10-CM | POA: Diagnosis not present

## 2021-06-01 DIAGNOSIS — G7281 Critical illness myopathy: Secondary | ICD-10-CM | POA: Diagnosis not present

## 2021-06-01 DIAGNOSIS — J9621 Acute and chronic respiratory failure with hypoxia: Secondary | ICD-10-CM | POA: Diagnosis not present

## 2021-06-01 DIAGNOSIS — J449 Chronic obstructive pulmonary disease, unspecified: Secondary | ICD-10-CM | POA: Diagnosis not present

## 2021-06-01 DIAGNOSIS — E871 Hypo-osmolality and hyponatremia: Secondary | ICD-10-CM | POA: Diagnosis not present

## 2021-06-01 DIAGNOSIS — R739 Hyperglycemia, unspecified: Secondary | ICD-10-CM | POA: Diagnosis not present

## 2021-06-01 DIAGNOSIS — I10 Essential (primary) hypertension: Secondary | ICD-10-CM | POA: Diagnosis not present

## 2021-06-01 DIAGNOSIS — T380X5S Adverse effect of glucocorticoids and synthetic analogues, sequela: Secondary | ICD-10-CM | POA: Diagnosis not present

## 2021-06-01 DIAGNOSIS — C9110 Chronic lymphocytic leukemia of B-cell type not having achieved remission: Secondary | ICD-10-CM | POA: Diagnosis not present

## 2021-06-01 DIAGNOSIS — K219 Gastro-esophageal reflux disease without esophagitis: Secondary | ICD-10-CM | POA: Diagnosis not present

## 2021-06-01 DIAGNOSIS — E883 Tumor lysis syndrome: Secondary | ICD-10-CM | POA: Diagnosis not present

## 2021-06-01 DIAGNOSIS — K5900 Constipation, unspecified: Secondary | ICD-10-CM | POA: Diagnosis not present

## 2021-06-01 DIAGNOSIS — Z430 Encounter for attention to tracheostomy: Secondary | ICD-10-CM | POA: Diagnosis not present

## 2021-06-02 DIAGNOSIS — J9620 Acute and chronic respiratory failure, unspecified whether with hypoxia or hypercapnia: Secondary | ICD-10-CM | POA: Diagnosis not present

## 2021-06-02 DIAGNOSIS — J449 Chronic obstructive pulmonary disease, unspecified: Secondary | ICD-10-CM | POA: Diagnosis not present

## 2021-06-04 DIAGNOSIS — J449 Chronic obstructive pulmonary disease, unspecified: Secondary | ICD-10-CM | POA: Diagnosis not present

## 2021-06-04 DIAGNOSIS — J9620 Acute and chronic respiratory failure, unspecified whether with hypoxia or hypercapnia: Secondary | ICD-10-CM | POA: Diagnosis not present

## 2021-06-05 ENCOUNTER — Other Ambulatory Visit: Payer: Self-pay

## 2021-06-05 ENCOUNTER — Other Ambulatory Visit (HOSPITAL_COMMUNITY): Payer: Self-pay

## 2021-06-05 ENCOUNTER — Ambulatory Visit (INDEPENDENT_AMBULATORY_CARE_PROVIDER_SITE_OTHER): Payer: BC Managed Care – PPO

## 2021-06-05 ENCOUNTER — Encounter: Payer: Self-pay | Admitting: Adult Health

## 2021-06-05 ENCOUNTER — Ambulatory Visit (INDEPENDENT_AMBULATORY_CARE_PROVIDER_SITE_OTHER): Payer: BC Managed Care – PPO | Admitting: Adult Health

## 2021-06-05 DIAGNOSIS — J9811 Atelectasis: Secondary | ICD-10-CM | POA: Diagnosis not present

## 2021-06-05 DIAGNOSIS — J449 Chronic obstructive pulmonary disease, unspecified: Secondary | ICD-10-CM

## 2021-06-05 DIAGNOSIS — J441 Chronic obstructive pulmonary disease with (acute) exacerbation: Secondary | ICD-10-CM | POA: Diagnosis not present

## 2021-06-05 DIAGNOSIS — J9612 Chronic respiratory failure with hypercapnia: Secondary | ICD-10-CM

## 2021-06-05 DIAGNOSIS — J90 Pleural effusion, not elsewhere classified: Secondary | ICD-10-CM | POA: Diagnosis not present

## 2021-06-05 DIAGNOSIS — C9111 Chronic lymphocytic leukemia of B-cell type in remission: Secondary | ICD-10-CM

## 2021-06-05 DIAGNOSIS — Z8701 Personal history of pneumonia (recurrent): Secondary | ICD-10-CM | POA: Diagnosis not present

## 2021-06-05 DIAGNOSIS — J439 Emphysema, unspecified: Secondary | ICD-10-CM | POA: Diagnosis not present

## 2021-06-05 MED FILL — IMBRUVICA 420 MG TABS: 28 days supply | Qty: 28 | Fill #2

## 2021-06-05 NOTE — Assessment & Plan Note (Signed)
Continue on oxygen 2 L to maintain O2 saturation greater than 88 to 90%

## 2021-06-05 NOTE — Patient Instructions (Signed)
Mucinex DM  Liquid Twice daily  As needed  Cough/congestion  Continue on Budesonide Neb Twice daily   Continue on Duoneb Four times a day  .  Flutter valve Three times a day  .  Continue on  Oxygen 2l/m .  Activity as tolerated.  High protein diet.  Follow up with Dr. Chase Caller in 6-8 weeks as planned and As needed   Please contact office for sooner follow up if symptoms do not improve or worsen or seek emergency care

## 2021-06-05 NOTE — Progress Notes (Signed)
@Patient  ID: Isaac [name], male    DOB: [birth year], 65 y.o.   MRN: [id number]  Chief Complaint  Patient presents with   Follow-up    Referring provider: [facility] *  HPI: 65 year old male former smoker followed for severe COPD and chronic hypoxic respiratory failure on oxygen Medical history significant for CLL   TEST/EVENTS :  Echo 11/2020 EF 60-65%, RV size nml   PFT July 2015 FEV1 24%, ratio 42, FVC 44%, no significant bronchodilator response, DLCO 26%.  06/05/2021 Follow up : COPD , O2 RF , PNA  Patient returns for a 1 month follow-up.  Patient was seen last visit for a COPD exacerbation and pneumonia.  Chest x-ray showed left basilar pneumonia and small left pleural effusion.  He was treated with doxycycline and a prednisone taper.  History Since last visit patient is feeling better with decreased cough/congestion . Gets winded with minimal activity. Sedentary lifestyle.  Remains Pulmicort Neb Twice daily . Duoneb Four times a day  .  Chest xray today reviewed independently , appears improved with resolved effusion. Minimal left base atelectasis .  On Oxygen 2l/m , no increased oxygen demands.  Appetite is fair with no nausea vomiting or diarrhea.  No hemoptysis.  Patient is following with ENT for chronic sinus and nasal congestion.  Patient is following with oncology for CLL.  Most recent labs showed WBC count is slowly decreasing on treatment..        Allergies  Allergen Reactions   Codeine Itching and Other (See Comments)    "jittery"   Prozac [Fluoxetine Hcl] Other (See Comments)    confusion    Immunization History  Administered Date(s) Administered   Influenza Split 09/27/2011, 08/20/2012, 07/27/2013   Influenza, High Dose Seasonal PF 09/05/2018   Influenza, Quadrivalent, Recombinant, Inj, Pf 09/25/2019   Influenza,inj,Quad PF,6+ Mos 08/22/2015, 10/26/2016, 01/07/2018, 08/12/2020   Influenza-Unspecified 12/24/2011, 09/06/2012,  10/19/2013, 08/31/2015, 08/15/2016, 10/12/2019   PFIZER(Purple Top)SARS-COV-2 Vaccination 03/23/2020, 04/13/2020   Pneumococcal Conjugate-13 08/22/2015, 03/22/2021   Pneumococcal Polysaccharide-23 07/23/2012, 09/06/2012   Tdap 10/01/2012    Past Medical History:  Diagnosis Date   Acute on chronic respiratory failure with hypoxia (HCC)    Anxiety    Chronic lymphocytic leukemia in remission (HCC)    COPD (chronic obstructive pulmonary disease) (HCC)    COPD, severe (HCC)    GERD (gastroesophageal reflux disease)    Hematuria    Hyperlipidemia    Hypertension    Insomnia    Left lower lobe pneumonia    Nephrolithiasis    Tobacco dependence     Tobacco History: Social History   Tobacco Use  Smoking Status Former   Packs/day: 1.00   Years: 35.00   Pack years: 35.00   Types: Cigarettes   Quit date: 12/07/2008   Years since quitting: 12.5  Smokeless Tobacco Never   Counseling given: Not Answered   Outpatient Medications Prior to Visit  Medication Sig Dispense Refill   albuterol (PROVENTIL) (2.5 MG/3ML) 0.083% nebulizer solution Take 3 mLs (2.5 mg total) by nebulization every 4 (four) hours as needed for wheezing or shortness of breath. 75 mL 12   albuterol (VENTOLIN HFA) 108 (90 Base) MCG/ACT inhaler INHALE 2 PUFFS INTO THE LUNGS EVERY 4 (FOUR) HOURS AS NEEDED FOR WHEEZING OR SHORTNESS OF BREATH. 8.5 each 5   allopurinol (ZYLOPRIM) 100 MG tablet Place 1 tablet (100 mg total) into feeding tube daily. 90 tablet 3   arformoterol (BROVANA) 15 MCG/2ML NEBU Take 2 mLs (15  mcg total) by nebulization 2 (two) times daily. 120 mL    budesonide (PULMICORT) 0.5 MG/2ML nebulizer solution Take 2 mLs (0.5 mg total) by nebulization 2 (two) times daily.  12   cloNIDine (CATAPRES - DOSED IN MG/24 HR) 0.1 mg/24hr patch Place 0.1 mg onto the skin once a week. Tuesdays     dicyclomine (BENTYL) 10 MG capsule Take 10 mg by mouth daily.     docusate sodium (COLACE) 100 MG capsule Take 100 mg by  mouth at bedtime.     Ensure (ENSURE) Take 237 mLs by mouth daily. Chocolate flavor     fluticasone (FLONASE) 50 MCG/ACT nasal spray Place 1 spray into both nostrils daily.  2   furosemide (LASIX) 40 MG tablet Take 40 mg by mouth daily.     guaiFENesin (MUCINEX) 600 MG 12 hr tablet Take 600 mg by mouth daily.     ibrutinib 420 MG TABS Take 1 tablet (420 mg) by mouth daily. 30 tablet 3   ipratropium-albuterol (DUONEB) 0.5-2.5 (3) MG/3ML SOLN Take 3 mLs by nebulization every 6 (six) hours as needed. 360 mL 3   metoprolol tartrate (LOPRESSOR) 25 MG tablet Take 25 mg by mouth 2 (two) times daily.     mometasone-formoterol (DULERA) 100-5 MCG/ACT AERO      montelukast (SINGULAIR) 10 MG tablet Take 10 mg by mouth every evening.     pantoprazole (PROTONIX) 40 MG tablet Take 40 mg by mouth at bedtime.     polyethylene glycol (MIRALAX / GLYCOLAX) 17 g packet Place 17 g into feeding tube daily as needed for moderate constipation. (Patient taking differently: Take 17 g by mouth as needed for moderate constipation.) 14 each 0   sodium chloride (OCEAN) 0.65 % SOLN nasal spray Place 2 sprays into both nostrils as needed for congestion.  0   doxycycline (VIBRA-TABS) 100 MG tablet Take 1 tablet (100 mg total) by mouth 2 (two) times daily. (Patient not taking: Reported on 06/05/2021) 10 tablet 0   No facility-administered medications prior to visit.     Review of Systems:   Constitutional:   No  weight loss, night sweats,  Fevers, chills,  +fatigue, or  lassitude.  HEENT:   No headaches,  Difficulty swallowing,  Tooth/dental problems, or  Sore throat,                No sneezing, itching, ear ache,  +nasal congestion, post nasal drip,   CV:  No chest pain,  Orthopnea, PND, swelling in lower extremities, anasarca, dizziness, palpitations, syncope.   GI  No heartburn, indigestion, abdominal pain, nausea, vomiting, diarrhea, change in bowel habits, loss of appetite, bloody stools.   Resp:  No chest wall  deformity  Skin: no rash or lesions.  GU: no dysuria, change in color of urine, no urgency or frequency.  No flank pain, no hematuria   MS:  No joint pain or swelling.  No decreased range of motion.  No back pain.    Physical Exam  BP 134/88 (BP Location: Right Arm, Patient Position: Sitting, Cuff Size: Normal)   Pulse 89   Temp 98.7 F (37.1 C) (Oral)   Ht 5\' 11"  (1.803 m)   Wt 215 lb (97.5 kg)   SpO2 98%   BMI 29.99 kg/m   GEN: A/Ox3; pleasant , NAD, chronically ill appearing on O2    HEENT:  [place]/AT,   NOSE-clear, THROAT-clear, no lesions, no postnasal drip or exudate noted.   NECK:  Supple w/ fair ROM;  no JVD; normal carotid impulses w/o bruits; no thyromegaly or nodules palpated; no lymphadenopathy.    RESP  Scattered rhonchi   no accessory muscle use, no dullness to percussion  CARD:  RRR, no m/r/g, tr peripheral edema, pulses intact, no cyanosis or clubbing.  GI:   Soft & nt; nml bowel sounds; no organomegaly or masses detected.   Musco: Warm bil, no deformities or joint swelling noted.   Neuro: alert, no focal deficits noted.    Skin: Warm, no lesions or rashes    Lab Results:  CBC    Component Value Date/Time   WBC 96.7 (HH) 05/12/2021 1333   WBC 134.6 (HH) 03/08/2021 0440   RBC 4.45 05/12/2021 1333   HGB 11.4 (L) 05/12/2021 1333   HGB 11.5 (L) 12/08/2020 1046   HCT 39.0 05/12/2021 1333   PLT 244 05/12/2021 1333   MCV 87.6 05/12/2021 1333   MCH 25.6 (L) 05/12/2021 1333   MCHC 29.2 (L) 05/12/2021 1333   RDW 14.7 05/12/2021 1333   LYMPHSABS 85.8 (H) 05/12/2021 1333   MONOABS 1.3 (H) 05/12/2021 1333   EOSABS 0.0 05/12/2021 1333   BASOSABS 0.1 05/12/2021 1333    BMET    Component Value Date/Time   NA 141 05/12/2021 1333   K 4.1 05/12/2021 1333   CL 97 (L) 05/12/2021 1333   CO2 37 (H) 05/12/2021 1333   GLUCOSE 136 (H) 05/12/2021 1333   BUN 16 05/12/2021 1333   CREATININE 0.93 05/12/2021 1333   CALCIUM 9.0 05/12/2021 1333   GFRNONAA >60  05/12/2021 1333   GFRAA >60 12/17/2016 0839    BNP    Component Value Date/Time   BNP 93.2 03/04/2021 1555    ProBNP    Component Value Date/Time   PROBNP 28.5 07/04/2012 1600    Imaging: DG Chest 2 View  Result Date: 06/05/2021 CLINICAL DATA:  COPD.  History of a left pleural effusion. EXAM: CHEST - 2 VIEW COMPARISON:  PA and lateral chest 05/08/2021.  CT chest 03/04/2021. FINDINGS: Previously seen small left pleural effusion has resolved. Minimal atelectasis is seen in the left lung base. Right lung clear. The lungs are emphysematous. Cardiomegaly. No acute bony abnormality. Sclerotic lesion left fifth rib is unchanged. IMPRESSION: Small left pleural effusion has resolved. Mild left basilar atelectasis noted. Cardiomegaly. Aortic Atherosclerosis (ICD10-I70.0) and Emphysema (ICD10-J43.9). Electronically Signed   By: Inge Rise M.D.   On: 06/05/2021 14:46   DG Chest 2 View  Result Date: 05/09/2021 CLINICAL DATA:  Chronic respiratory failure, hypercapnia EXAM: CHEST - 2 VIEW COMPARISON:  03/04/2021 FINDINGS: Frontal and lateral views of the chest demonstrate an unremarkable cardiac silhouette. The right-sided airspace disease noted on prior study has resolved in the interim. Small left pleural effusion has developed in the interim. Minimal consolidation in the left lower lobe may be atelectasis or airspace disease. No pneumothorax. No acute bony abnormalities. IMPRESSION: 1. Interval development of small left pleural effusion, with patchy left basilar consolidation. 2. Resolution of the right-sided airspace disease seen previously. 3. The mediastinal and hilar adenopathy noted on prior CT not adequately visualized by radiograph. Electronically Signed   By: Randa Ngo M.D.   On: 05/09/2021 10:00      PFT Results Latest Ref Rng & Units 06/08/2014  FVC-Pre L 2.09  FVC-Predicted Pre % 42  FVC-Post L 2.17  FVC-Predicted Post % 44  Pre FEV1/FVC % % 39  Post FEV1/FCV % % 42   FEV1-Pre L 0.83  FEV1-Predicted Pre % 22  FEV1-Post L 0.90  DLCO uncorrected ml/min/mmHg 8.83  DLCO [facility]% % 26  DLVA Predicted % 47    No results found for: NITRICOXIDE      Assessment & Plan:   COPD exacerbation (HCC) Recent exacerbation with associated pneumonia.  Now clinically improving after antibiotics and steroids. We discussed that he is high risk for decompensation and recurrent exacerbations.  Discussed using nocturnal BiPAP or noninvasive support.  Patient education was given.- Patient at this time does not want to start BiPAP. Continue on oxygen.  Continue on budesonide and DuoNeb.  Plan  Patient Instructions  Mucinex DM  Liquid Twice daily  As needed  Cough/congestion  Continue on Budesonide Neb Twice daily   Continue on Duoneb Four times a day  .  Flutter valve Three times a day  .  Continue on  Oxygen 2l/m .  Activity as tolerated.  High protein diet.  Follow up with Dr. Chase Caller in 6-8 weeks as planned and As needed   Please contact office for sooner follow up if symptoms do not improve or worsen or seek emergency care        Chronic respiratory failure with hypercapnia Continue on oxygen 2 L to maintain O2 saturation greater than 88 to 90%  Chronic lymphocytic leukemia in remission [facility]) Continue follow-up with oncology     Rexene Edison, NP 06/05/2021

## 2021-06-05 NOTE — Assessment & Plan Note (Signed)
Recent exacerbation with associated pneumonia.  Now clinically improving after antibiotics and steroids. We discussed that he is high risk for decompensation and recurrent exacerbations.  Discussed using nocturnal BiPAP or noninvasive support.  Patient education was given.- Patient at this time does not want to start BiPAP. Continue on oxygen.  Continue on budesonide and DuoNeb.  Plan  Patient Instructions  Mucinex DM  Liquid Twice daily  As needed  Cough/congestion  Continue on Budesonide Neb Twice daily   Continue on Duoneb Four times a day  .  Flutter valve Three times a day  .  Continue on  Oxygen 2l/m .  Activity as tolerated.  High protein diet.  Follow up with Dr. Chase Caller in 6-8 weeks as planned and As needed   Please contact office for sooner follow up if symptoms do not improve or worsen or seek emergency care

## 2021-06-05 NOTE — Assessment & Plan Note (Signed)
Continue follow-up with oncology 

## 2021-06-06 ENCOUNTER — Other Ambulatory Visit (HOSPITAL_COMMUNITY): Payer: Self-pay

## 2021-06-08 ENCOUNTER — Telehealth: Payer: Self-pay | Admitting: Adult Health

## 2021-06-08 DIAGNOSIS — J449 Chronic obstructive pulmonary disease, unspecified: Secondary | ICD-10-CM

## 2021-06-08 NOTE — Telephone Encounter (Signed)
Pts wife stated that during their appt with Nurse Parrett on (06/05/2021) they discussed the possibility of palliative care and at the time were unsure but they have discussed it together and have decided that they do want to go through with her recommendations.  Pls regard; [phone]

## 2021-06-08 NOTE — Telephone Encounter (Signed)
Called and spoke with Patient's wife Drenda Freeze.  Drenda Freeze stated that during their OV 06/05/21 with Tammy, NP  they discussed the possibility of palliative care.  Germaine stated at that time they were unsure, but they have discussed it. Drenda Freeze stated together they have decided that they do want to go through with her recommendations.  Message routed to [place], NP

## 2021-06-13 ENCOUNTER — Other Ambulatory Visit: Payer: Self-pay | Admitting: Internal Medicine

## 2021-06-13 MED FILL — ALBUTEROL HFA 90 MCG INHALER: 16 days supply | Fill #1

## 2021-06-13 MED FILL — HYDROXYZINE PAM 25 MG CAP: 30 days supply | Qty: 90 | Fill #2

## 2021-06-14 MED FILL — IPRAT-ALBUT 0.5-3(2.5) MG/3 ML: 90 days supply | Qty: 360 | Fill #0

## 2021-06-15 ENCOUNTER — Ambulatory Visit
Admission: RE | Admit: 2021-06-15 | Discharge: 2021-06-15 | Disposition: A | Discharge status: Home / Self Care | Payer: BC Managed Care – PPO | Source: Ambulatory Visit | Attending: Otolaryngology | Admitting: Otolaryngology

## 2021-06-15 DIAGNOSIS — H748X3 Other specified disorders of middle ear and mastoid, bilateral: Secondary | ICD-10-CM | POA: Diagnosis not present

## 2021-06-15 DIAGNOSIS — J392 Other diseases of pharynx: Secondary | ICD-10-CM

## 2021-06-15 DIAGNOSIS — R040 Epistaxis: Secondary | ICD-10-CM | POA: Diagnosis not present

## 2021-06-15 MED ORDER — IOPAMIDOL (ISOVUE-300) INJECTION 61%
75.0000 mL | Freq: Once | INTRAVENOUS | Status: AC | PRN
  Administered 2021-06-15: 75 mL via INTRAVENOUS

## 2021-06-15 NOTE — Telephone Encounter (Signed)
I have placed the referral and let Germaine know this was done. Nothing further needed.

## 2021-06-15 NOTE — Telephone Encounter (Signed)
Refer to Palliative care - COPD /O2 RF

## 2021-06-16 ENCOUNTER — Telehealth: Payer: Self-pay

## 2021-06-16 NOTE — Telephone Encounter (Signed)
Spoke with patient's wife Drenda Freeze and scheduled an in-person Palliative Consult for 07/13/21 @ 10:30 AM with Dr. Hollace Kinnier. Documentation will be noted in [place].   COVID screening was negative. No pets in home. Patient lives with wife.   Consent obtained; updated Outlook/Netsmart/Team List and Epic.   Family is aware they may be receiving a call from NP the day before or day of to confirm appointment.

## 2021-06-20 MED FILL — CLONIDINE 0.1 MG/DAY PATCH: 84 days supply | Qty: 12 | Fill #1

## 2021-06-21 DIAGNOSIS — H6981 Other specified disorders of Eustachian tube, right ear: Secondary | ICD-10-CM | POA: Diagnosis not present

## 2021-06-21 DIAGNOSIS — J340 Abscess, furuncle and carbuncle of nose: Secondary | ICD-10-CM | POA: Diagnosis not present

## 2021-06-21 DIAGNOSIS — Z9622 Myringotomy tube(s) status: Secondary | ICD-10-CM | POA: Diagnosis not present

## 2021-06-23 ENCOUNTER — Telehealth: Payer: Self-pay

## 2021-06-23 NOTE — Telephone Encounter (Signed)
Received message from nurse at Dr. Hazeline Junker office inquiring if Palliative care can assist with monthly lab draws. Returned call and left VM stating that Palliative Care visit is scheduled for 8/18 and will direct if we are able to assist with this.

## 2021-06-26 ENCOUNTER — Other Ambulatory Visit: Payer: Self-pay | Admitting: *Deleted

## 2021-06-26 ENCOUNTER — Telehealth: Payer: Self-pay | Admitting: *Deleted

## 2021-06-26 MED FILL — PANTOPRAZOLE SOD DR 40 MG TAB: 90 days supply | Qty: 90 | Fill #1

## 2021-06-26 MED FILL — FUROSEMIDE 40 MG TABLET: 90 days supply | Qty: 90 | Fill #1

## 2021-06-26 MED FILL — DICYCLOMINE 10 MG CAPSULE: 90 days supply | Qty: 540 | Fill #1

## 2021-06-26 MED FILL — ALLOPURINOL 100 MG TABLET: 90 days supply | Qty: 90 | Fill #0

## 2021-06-26 NOTE — Telephone Encounter (Signed)
Received call from [facility] with [facility] [phone].    She stated that the palliative Nurse Practitioner will evaluate when she goes out to the home on 07/13/2021 and if the patient needs to have those labs drawn CMT/CBC/Ph/Uric Acid then she will ask that of the medical director.  Situations that would help make this possible would mean that the patient is truly home bound.  If not then they can recommend other agencies like [facility] that could go into the home and draw on behalf of Dr Alen Blew.  She will keep [place] posted after that visit.

## 2021-06-27 ENCOUNTER — Other Ambulatory Visit (HOSPITAL_COMMUNITY): Payer: Self-pay

## 2021-06-28 ENCOUNTER — Other Ambulatory Visit (HOSPITAL_COMMUNITY): Payer: Self-pay

## 2021-07-03 ENCOUNTER — Other Ambulatory Visit (HOSPITAL_COMMUNITY): Payer: Self-pay

## 2021-07-03 DIAGNOSIS — J449 Chronic obstructive pulmonary disease, unspecified: Secondary | ICD-10-CM | POA: Diagnosis not present

## 2021-07-03 DIAGNOSIS — J9620 Acute and chronic respiratory failure, unspecified whether with hypoxia or hypercapnia: Secondary | ICD-10-CM | POA: Diagnosis not present

## 2021-07-03 MED FILL — IMBRUVICA 420 MG TABS: 28 days supply | Qty: 28 | Fill #3

## 2021-07-05 DIAGNOSIS — J449 Chronic obstructive pulmonary disease, unspecified: Secondary | ICD-10-CM | POA: Diagnosis not present

## 2021-07-05 DIAGNOSIS — J9620 Acute and chronic respiratory failure, unspecified whether with hypoxia or hypercapnia: Secondary | ICD-10-CM | POA: Diagnosis not present

## 2021-07-07 DIAGNOSIS — R052 Subacute cough: Secondary | ICD-10-CM | POA: Diagnosis not present

## 2021-07-07 DIAGNOSIS — I10 Essential (primary) hypertension: Secondary | ICD-10-CM | POA: Diagnosis not present

## 2021-07-07 DIAGNOSIS — E119 Type 2 diabetes mellitus without complications: Secondary | ICD-10-CM | POA: Diagnosis not present

## 2021-07-07 DIAGNOSIS — E7800 Pure hypercholesterolemia, unspecified: Secondary | ICD-10-CM | POA: Diagnosis not present

## 2021-07-07 DIAGNOSIS — J449 Chronic obstructive pulmonary disease, unspecified: Secondary | ICD-10-CM | POA: Diagnosis not present

## 2021-07-07 DIAGNOSIS — L989 Disorder of the skin and subcutaneous tissue, unspecified: Secondary | ICD-10-CM | POA: Diagnosis not present

## 2021-07-07 DIAGNOSIS — D72829 Elevated white blood cell count, unspecified: Secondary | ICD-10-CM | POA: Diagnosis not present

## 2021-07-07 MED FILL — BENZONATATE 200 MG CAPSULE: 30 days supply | Qty: 90 | Fill #0

## 2021-07-07 MED FILL — GENTAMICIN 0.1% CREAM: 7 days supply | Qty: 15 | Fill #0

## 2021-07-13 DIAGNOSIS — J3481 Nasal mucositis (ulcerative): Secondary | ICD-10-CM | POA: Diagnosis not present

## 2021-07-13 DIAGNOSIS — Z515 Encounter for palliative care: Secondary | ICD-10-CM | POA: Diagnosis not present

## 2021-07-13 DIAGNOSIS — J9691 Respiratory failure, unspecified with hypoxia: Secondary | ICD-10-CM | POA: Diagnosis not present

## 2021-07-13 DIAGNOSIS — J449 Chronic obstructive pulmonary disease, unspecified: Secondary | ICD-10-CM | POA: Diagnosis not present

## 2021-07-13 DIAGNOSIS — C9510 Chronic leukemia of unspecified cell type not having achieved remission: Secondary | ICD-10-CM | POA: Diagnosis not present

## 2021-07-18 MED FILL — HYDROXYZINE PAM 25 MG CAP: 90 days supply | Qty: 270 | Fill #0

## 2021-07-20 DIAGNOSIS — F32A Depression, unspecified: Secondary | ICD-10-CM | POA: Diagnosis not present

## 2021-07-20 DIAGNOSIS — I5032 Chronic diastolic (congestive) heart failure: Secondary | ICD-10-CM | POA: Diagnosis not present

## 2021-07-20 DIAGNOSIS — D509 Iron deficiency anemia, unspecified: Secondary | ICD-10-CM | POA: Diagnosis not present

## 2021-07-20 DIAGNOSIS — I130 Hypertensive heart and chronic kidney disease with heart failure and stage 1 through stage 4 chronic kidney disease, or unspecified chronic kidney disease: Secondary | ICD-10-CM | POA: Diagnosis not present

## 2021-07-20 DIAGNOSIS — M160 Bilateral primary osteoarthritis of hip: Secondary | ICD-10-CM | POA: Diagnosis not present

## 2021-07-20 DIAGNOSIS — F419 Anxiety disorder, unspecified: Secondary | ICD-10-CM | POA: Diagnosis not present

## 2021-07-20 DIAGNOSIS — C9111 Chronic lymphocytic leukemia of B-cell type in remission: Secondary | ICD-10-CM | POA: Diagnosis not present

## 2021-07-20 DIAGNOSIS — D631 Anemia in chronic kidney disease: Secondary | ICD-10-CM | POA: Diagnosis not present

## 2021-07-20 DIAGNOSIS — N189 Chronic kidney disease, unspecified: Secondary | ICD-10-CM | POA: Diagnosis not present

## 2021-07-20 DIAGNOSIS — D630 Anemia in neoplastic disease: Secondary | ICD-10-CM | POA: Diagnosis not present

## 2021-07-20 DIAGNOSIS — M170 Bilateral primary osteoarthritis of knee: Secondary | ICD-10-CM | POA: Diagnosis not present

## 2021-07-20 DIAGNOSIS — H6122 Impacted cerumen, left ear: Secondary | ICD-10-CM | POA: Diagnosis not present

## 2021-07-20 DIAGNOSIS — J9611 Chronic respiratory failure with hypoxia: Secondary | ICD-10-CM | POA: Diagnosis not present

## 2021-07-20 DIAGNOSIS — G4733 Obstructive sleep apnea (adult) (pediatric): Secondary | ICD-10-CM | POA: Diagnosis not present

## 2021-07-20 DIAGNOSIS — J449 Chronic obstructive pulmonary disease, unspecified: Secondary | ICD-10-CM | POA: Diagnosis not present

## 2021-07-20 DIAGNOSIS — E669 Obesity, unspecified: Secondary | ICD-10-CM | POA: Diagnosis not present

## 2021-07-20 MED FILL — ALBUTEROL HFA 90 MCG INHALER: 16 days supply | Fill #2

## 2021-07-24 DIAGNOSIS — Z515 Encounter for palliative care: Secondary | ICD-10-CM | POA: Diagnosis not present

## 2021-07-24 DIAGNOSIS — F0281 Dementia in other diseases classified elsewhere with behavioral disturbance: Secondary | ICD-10-CM | POA: Diagnosis not present

## 2021-07-25 ENCOUNTER — Other Ambulatory Visit: Payer: Self-pay | Admitting: Oncology

## 2021-07-25 ENCOUNTER — Other Ambulatory Visit: Payer: Self-pay | Admitting: Internal Medicine

## 2021-07-25 ENCOUNTER — Other Ambulatory Visit (HOSPITAL_COMMUNITY): Payer: Self-pay

## 2021-07-25 DIAGNOSIS — M170 Bilateral primary osteoarthritis of knee: Secondary | ICD-10-CM | POA: Diagnosis not present

## 2021-07-25 DIAGNOSIS — D631 Anemia in chronic kidney disease: Secondary | ICD-10-CM | POA: Diagnosis not present

## 2021-07-25 DIAGNOSIS — H6122 Impacted cerumen, left ear: Secondary | ICD-10-CM | POA: Diagnosis not present

## 2021-07-25 DIAGNOSIS — D509 Iron deficiency anemia, unspecified: Secondary | ICD-10-CM | POA: Diagnosis not present

## 2021-07-25 DIAGNOSIS — G4733 Obstructive sleep apnea (adult) (pediatric): Secondary | ICD-10-CM | POA: Diagnosis not present

## 2021-07-25 DIAGNOSIS — D630 Anemia in neoplastic disease: Secondary | ICD-10-CM | POA: Diagnosis not present

## 2021-07-25 DIAGNOSIS — C9111 Chronic lymphocytic leukemia of B-cell type in remission: Secondary | ICD-10-CM | POA: Diagnosis not present

## 2021-07-25 DIAGNOSIS — F419 Anxiety disorder, unspecified: Secondary | ICD-10-CM | POA: Diagnosis not present

## 2021-07-25 DIAGNOSIS — J449 Chronic obstructive pulmonary disease, unspecified: Secondary | ICD-10-CM | POA: Diagnosis not present

## 2021-07-25 DIAGNOSIS — F32A Depression, unspecified: Secondary | ICD-10-CM | POA: Diagnosis not present

## 2021-07-25 DIAGNOSIS — M160 Bilateral primary osteoarthritis of hip: Secondary | ICD-10-CM | POA: Diagnosis not present

## 2021-07-25 DIAGNOSIS — I5032 Chronic diastolic (congestive) heart failure: Secondary | ICD-10-CM | POA: Diagnosis not present

## 2021-07-25 DIAGNOSIS — I130 Hypertensive heart and chronic kidney disease with heart failure and stage 1 through stage 4 chronic kidney disease, or unspecified chronic kidney disease: Secondary | ICD-10-CM | POA: Diagnosis not present

## 2021-07-25 DIAGNOSIS — J9611 Chronic respiratory failure with hypoxia: Secondary | ICD-10-CM | POA: Diagnosis not present

## 2021-07-25 DIAGNOSIS — E669 Obesity, unspecified: Secondary | ICD-10-CM | POA: Diagnosis not present

## 2021-07-25 DIAGNOSIS — N189 Chronic kidney disease, unspecified: Secondary | ICD-10-CM | POA: Diagnosis not present

## 2021-07-25 MED ORDER — IMBRUVICA 420 MG PO TABS
420.0000 mg | ORAL_TABLET | Freq: Every day | ORAL | 3 refills | Status: DC
  Filled 2021-08-02: qty 28, 28d supply, fill #0
  Filled 2021-08-24: qty 28, 28d supply, fill #1
  Filled 2021-09-25: qty 28, 28d supply, fill #2
  Filled 2021-10-18: qty 28, 28d supply, fill #3

## 2021-07-26 DIAGNOSIS — E669 Obesity, unspecified: Secondary | ICD-10-CM | POA: Diagnosis not present

## 2021-07-26 DIAGNOSIS — M160 Bilateral primary osteoarthritis of hip: Secondary | ICD-10-CM | POA: Diagnosis not present

## 2021-07-26 DIAGNOSIS — D630 Anemia in neoplastic disease: Secondary | ICD-10-CM | POA: Diagnosis not present

## 2021-07-26 DIAGNOSIS — C9111 Chronic lymphocytic leukemia of B-cell type in remission: Secondary | ICD-10-CM | POA: Diagnosis not present

## 2021-07-26 DIAGNOSIS — N189 Chronic kidney disease, unspecified: Secondary | ICD-10-CM | POA: Diagnosis not present

## 2021-07-26 DIAGNOSIS — M170 Bilateral primary osteoarthritis of knee: Secondary | ICD-10-CM | POA: Diagnosis not present

## 2021-07-26 DIAGNOSIS — I130 Hypertensive heart and chronic kidney disease with heart failure and stage 1 through stage 4 chronic kidney disease, or unspecified chronic kidney disease: Secondary | ICD-10-CM | POA: Diagnosis not present

## 2021-07-26 DIAGNOSIS — H6122 Impacted cerumen, left ear: Secondary | ICD-10-CM | POA: Diagnosis not present

## 2021-07-26 DIAGNOSIS — J449 Chronic obstructive pulmonary disease, unspecified: Secondary | ICD-10-CM | POA: Diagnosis not present

## 2021-07-26 DIAGNOSIS — I5032 Chronic diastolic (congestive) heart failure: Secondary | ICD-10-CM | POA: Diagnosis not present

## 2021-07-26 DIAGNOSIS — F32A Depression, unspecified: Secondary | ICD-10-CM | POA: Diagnosis not present

## 2021-07-26 DIAGNOSIS — D631 Anemia in chronic kidney disease: Secondary | ICD-10-CM | POA: Diagnosis not present

## 2021-07-26 DIAGNOSIS — D509 Iron deficiency anemia, unspecified: Secondary | ICD-10-CM | POA: Diagnosis not present

## 2021-07-26 DIAGNOSIS — G4733 Obstructive sleep apnea (adult) (pediatric): Secondary | ICD-10-CM | POA: Diagnosis not present

## 2021-07-26 DIAGNOSIS — J9611 Chronic respiratory failure with hypoxia: Secondary | ICD-10-CM | POA: Diagnosis not present

## 2021-07-26 DIAGNOSIS — F419 Anxiety disorder, unspecified: Secondary | ICD-10-CM | POA: Diagnosis not present

## 2021-07-27 ENCOUNTER — Other Ambulatory Visit (HOSPITAL_COMMUNITY): Payer: Self-pay

## 2021-07-27 ENCOUNTER — Other Ambulatory Visit: Payer: Self-pay

## 2021-07-27 ENCOUNTER — Encounter: Payer: Self-pay | Admitting: Internal Medicine

## 2021-07-27 ENCOUNTER — Ambulatory Visit (INDEPENDENT_AMBULATORY_CARE_PROVIDER_SITE_OTHER): Payer: BC Managed Care – PPO | Admitting: Internal Medicine

## 2021-07-27 VITALS — BP 120/82 | HR 88 | Temp 98.2°F | Ht 70.0 in | Wt 215.0 lb

## 2021-07-27 DIAGNOSIS — R5381 Other malaise: Secondary | ICD-10-CM

## 2021-07-27 DIAGNOSIS — Z8701 Personal history of pneumonia (recurrent): Secondary | ICD-10-CM

## 2021-07-27 DIAGNOSIS — J9612 Chronic respiratory failure with hypercapnia: Secondary | ICD-10-CM | POA: Diagnosis not present

## 2021-07-27 DIAGNOSIS — J449 Chronic obstructive pulmonary disease, unspecified: Secondary | ICD-10-CM

## 2021-07-27 DIAGNOSIS — Z9189 Other specified personal risk factors, not elsewhere classified: Secondary | ICD-10-CM | POA: Diagnosis not present

## 2021-07-27 NOTE — Progress Notes (Signed)
#Obesity Body mass index is 39.91 kg/(m^2). on 03/26/2013  #Chronic sinusitis T sinus 2006   - IMPRESSION:  1. Ethmoid and frontal sinus dise Case. Sphenoid and maxillary sinuses are clear.  2. Patent ostiomeatal complexes.  #Smoking history  reports that he quit smoking about 2 years ago. His smoking use included Cigarettes. He has a 35 pack-year smoking history. He does not have any smokeless tobacco history on file.  #Multifactorial dyspnea -  Nuclear med stress test  2013 -  - negative per hx. Done by Dr Einar Gip per hx  #COPD - Severe/very severe COPD Spirometry 12/31/11 done at [facility]- fev1 0.8L/21%, Rati 39, 23% BD response on fev1 May 2013: Walk test in office : Pulse 88% at rest -> 1 lap x 185 feet - pulse ox 88% and HR 110 and very dyspneic, so stopped - Status post pulmonary rehabilitation fall 2013   #Evaluation for lung cancer : CXR 12/24/11   Clear lung fields - personally revieweed  #Recurrent COPD - May 2013: Outpatient treatment with doxycycline and prednisone - August 2013: Hospitalization for COPD exacerbation - January 2014: Outpatient treatment with antibiotics and prednisone - March 2015 : telephoine Rx with sinusitis, abx and prednisone - April 2015: telephoine Rx with abx and prednisone - June 2014 - VDRF with admission to ICU.        OV 06/08/2014  Chief Complaint  Patient presents with   Follow-up    Pt states since using his neb meds his breathing has improved.C/o DOE. Denies CP/tightness and cough.    Followup from recent ICU hospitalization for respiratory failure. At this point in time he is significantly improved. He used to be bothered by significant sinus blockage for which he had an extensive workup including MRI and MRA but apparently after he started using nebulizers instead of inhalers this symptom has resolved. He still physically deconditioned. He did have home physical therapy but he found him down an operative go self-directed  exercises but after my counseling today he is open to reinitiating home physical therapy. This is because he still physically deconditioned. He uses oxygen and nebulizers on a compliant fashion. PFTs today show significant severe Gold stage IV lung disease with FEV1 of 0.92/24% and a ratio of 42. Total lung capacity of 122% and a DLCO that significantly impaired at 8.8/26%.  Labs reviewed June 2040 in the hospital at discharge he was anemic with a hemoglobin of 10 g percent. This has not been followed up. His chemistries were normal.  Past medical history: He did have a sleep study over the weekend. I do not know the results. This was ordered by Dr. Halford Chessman and I will set up followup for this to Dr. Jaclyn Prime 08/22/2015  Chief Complaint  Patient presents with   Follow-up    Pt states his breathing was worse over the summer because of the heat and humidity. Pt c/o night time prod cough with white mucus, right ear pressure, intermittent sinus pressure. Pt denies CP/tightness.    Follow-up chronic respiratory failure with hypoxemia and hypercapnia associated with Gold stage IV COPD and morbid obesity.  In early 2015 he had life-threatening hospitalization from respiratory failure. Last visit in the office was July 2015. After that he failed to follow-up. He now presents with his wife. They want refills of his nebulizers and wants to up-to-date himself with the vaccines. The last 1 year they deny any hospitalizations or emergency room visits or urgent care visits or  new medical diagnoses. He is mostly sedentary and is house sitting but he does do limited activities of daily living such as changing clothes and self-feed. He did travel to the beach but he stayed in the hotel in the beach. Currently he does have some mild baseline cough with yellow sputum but this is unchanged compared to baseline. There are no new issues.   OV 09/05/2016  Chief Complaint  Patient presents with   Follow-up    Pt  breathing is feels slightly worst due to weather. Breathing has changed since last seen. Uses nebulizer after excertion. No tightness, or congestion today. Coughing is sometimes dry and productive. coughing up clear mucus. Discuss flu shot, handy cap plaquer, and rescue inhaler.     Follow-up chronic approximately possibly hypercapnic respiratory failure from Gold stage IV COPD and morbid obesity and sleep apnea ruled out per history  Last seen one year ago. He was supposed to come back in 6 months but did not. Review of the chart suggests that he did not have any hospitalizations or emergency room visits. He and his wife confirmed that. He is extremely sedentary. He spends most of his day in bed playing video games and surfing the Internet. He has the ability to walk from room to room but is limited because of shortness of breath. He requests some help with activities of daily living such as changing clothes or taking a bath. Overall stable. He is asking for an increase in his nebulizer frequency. His upcoming oral surgery evaluation and might require anesthesia for it. Is requesting flu shot.   OV 04/30/2017  Chief Complaint  Patient presents with   Follow-up    FOLLOW UP FOR Chronic respiratory failure with hyercapnia, COPD , severe     Follow-up chronic approximately possibly hypercapnic respiratory failure from Gold stage IV COPD and morbid obesity and sleep apnea ruled out per history   Routine follow-up for this patient with the above medical problems. Overall is doing well. His wife is here with him. There are no new exacerbations or admissions. His son got married in the interim. He did fine with his oral surgery and is now feeling better. He complains of occasional mucus being stuck in his chest but chest x-ray earlier this year was clear. Uses pure mist and able to clear the mucus    OV 01/07/2018  Chief Complaint  Patient presents with   Follow-up    Pt has had elevated WBCs, SOB  is worse today than it has been, coughing at night. Denies any CP. DME: [facility], 2L at rest and 3L with movement.     End-stage COPD with hypoxemic respiratory failure on oxygen.  57-monthfollow-up but I last saw him in June 2018.  He presents with his wife.  I am meeting his daughter ERaquel Sarnafor the first time.  Overall they report that he is stable on his nebulizers and oxygen.  Apparently he has had a interim diagnosis of CLL.  Flu shot was held but after seeing hematologist he has been cleared to have a flu shot.  He wants to have a flu shot today.  With the cold weather in the last 2 days in the recent visit to the office today he is a little bit more short of breath than usual but does not think he is in an exacerbation.  COPD CAT score is 27       OV 09/05/2018  Subjective:  Patient ID: Isaac [name] male ,  DOB: 01-16-1956 , age 65 y.o. , MRN: [id number] , ADDRESS: [address] [place] [address]   09/05/2018 -   Chief Complaint  Patient presents with   Follow-up    Doing okay Sob is about the same.     HPI Isaac [name] 65 y.o. -presents for follow-up of his severe COPD and chronic hypoxemic respiratory failure.  He is here with his wife as usual.  In terms of stable.  COPD CAT score is 28.  He tells me that his primary care physician is ill.  He tells me that he would need to call me for sinus issues.  Wife noticed that he is no longer taking his nasal fluticasone and once a refill.  He has been having some toe issues I recommended referral to a podiatrist but he declined.  He wants to have a high-dose flu shot and asked if it was okay because he was only 65 years old.  Otherwise no issues.  While at home awake is able to get by at 2 L or less       OV 11/06/2019 - telephne visit. Limits, risks and benefit explained. Patient identified with 2 person identifier.   Subjective:  Patient ID: Isaac [name], male , DOB: [birth year] , age 83 y.o. , MRN: [id number] , ADDRESS:  [place] [address]   11/06/2019 -   Chief Complaint  Patient presents with   Follow-up    pt reports of sinus pressure (feels this is related to turning heat on) and mild sob with exertion. on 2L cont.      HPI Isaac [name] 65 y.o. - reports sinuss issues since weather gold cold and has turned heat on. Has vaoprizer and using nebs and alak selzer. But nose is still getting blockd. Breathing is stable. Using flonase and it helps a lot. Not using netti pot. Was thinking about saline nasal rinse. In past has used netti pot. Has had flu shot. Is doing very low risk covid-19 activities only    ROS - per HPI  08/12/2020  - Visit   65 year old male former smoker followed in our office for COPD and chronic respiratory failure.  He is followed by Dr. Chase Caller.  He was last seen in our office in May/2021 by TP NP.  He is up-to-date with his COVID-19 vaccinations.  His DME company [employer] has notified our office that he needs to requalify for oxygen today and this needs to be reflected in the notes.  We will work on evaluating this.  Patient with known COPD and chronic respiratory failure and currently benefits from being maintained on oxygen.  Patient reports her last antibiotics that were prescribed was in May/2021.  He feels that his breathing is at baseline.  May be slightly worsened shortness of breath.  He is currently maintained on 4 L pulsed O2 with his Energen.  He wears 3 L continuous at night.  He needs to requalify for oxygen today.  He admits that he continues to lead a very sedentary lifestyle.  He does not exercise.  He continues to utilize his nebulized maintenance medications as prescribed.      OV 05/08/2021  Subjective:  Patient ID: Isaac [name], male , DOB: 06-30-1956 , age 42 y.o. , MRN: [id number] , ADDRESS: [place] [address] [facility] [place] [facility] Patient Care Team: [facility] [place] as PCP  - General (Rheumatology)  This Provider for this visit: Treatment Team:  Attending Provider: Brand Males, MD    05/08/2021 -   Chief Complaint  Patient presents with   Follow-up    Pt has been in the hospital since last visit multiple times. Pt's breathing has also become worse since last visit and he has also become much weaker.     HPI Isaac [name] 65 y.o. -follow-up chronic hypoxemic respiratory failure.  Personally not seen in a few years.  He presents with his wife as always.  His daughter Raquel Sarna is here.  She believes she has met me once but I recall not having met her before.  I think a meeting for the first time but it is possible I met her before.  In the last year and a half or 2 years since I last saw him he has lost significant amount of weight.  He has had multiple admissions including 1 in April 2022 for left-sided pneumonia.  No aspiration history.  His CLL has gotten worse and he is on chemotherapy drug for this.  Even though he is lost weight and has had home physical therapy and Occupational Therapy continues to suffer from significant lack of motivation.  Today is reluctant to get chest x-ray.  I requested blood gas he is reluctant to do that.  We discussed the possibility of BiPAP therapy and he is reluctant for that.  His family says that he has significant loss of motivation.  He was not cooperative with physical therapy either despite having good strength.  He believes he is having a COPD exacerbation and is complaining of wheezing on the left side of his chest and he feels his pneumonia is coming back.  He is interested in antibiotic or prednisone therapy.  However he is somewhat reluctant for chest x-ray.  His current oxygen use is between 3 and 4 L.  PFT  PFT Results Latest Ref Rng & Units 06/08/2014  FVC-Pre L 2.09  FVC-Predicted Pre % 42  FVC-Post L 2.17  FVC-Predicted Post % 44  Pre FEV1/FVC % % 39  Post FEV1/FCV % % 42  FEV1-Pre L 0.83  FEV1-Predicted  Pre % 22  FEV1-Post L 0.90  DLCO uncorrected ml/min/mmHg 8.83  DLCO UNC% % 26  DLVA Predicted % 47    CT chest 03/04/21   IMPRESSION: 1. No evidence of pulmonary embolism. 2. Ground-glass airspace opacities involving the upper lobes bilaterally, the RIGHT LOWER LOBE, and the RIGHT MIDDLE LOBE, likely indicating pneumonia. 3. Extensive lymphadenopathy throughout the visualized lower cervical chains bilaterally, both axilla, the mediastinum, both hila, the retrocrural space and the upper abdomen. Index lymph nodes are measured above. This likely indicates recurrence of the patient's CLL or transformation into lymphoma. 4. Massive splenic enlargement (the spleen is incompletely imaged). 5. Aortic Atherosclerosis (ICD10-I70.0) and Emphysema (ICD10-J43.9).     Electronically Signed   By: Evangeline Dakin M.D.   On: 03/04/2021 18:41     06/05/2021 Follow up : COPD , O2 RF , PNA  Patient returns for a 1 month follow-up.  Patient was seen last visit for a COPD exacerbation and pneumonia.  Chest x-ray showed left basilar pneumonia and small left pleural effusion.  He was treated with doxycycline and a prednisone taper.  History Since last visit patient is feeling better with decreased cough/congestion . Gets winded with minimal activity. Sedentary lifestyle.  Remains Pulmicort Neb Twice daily . Duoneb Four times a day  .  Chest xray today reviewed independently , appears improved with resolved effusion. Minimal left  base atelectasis .  On Oxygen 2l/m , no increased oxygen demands.  Appetite is fair with no nausea vomiting or diarrhea.  No hemoptysis.  Patient is following with ENT for chronic sinus and nasal congestion.  Patient is following with oncology for CLL.  Most recent labs showed WBC count is slowly decreasing on treatment..     OV 07/27/2021  Subjective:  Patient ID: Isaac [name], male , DOB: 09-Sep-1956 , age 14 y.o. , MRN: [id number] , ADDRESS: [place] [address] [facility] [place] Patient Care Team: [facility], [place] as PCP - General (Rheumatology)  This Provider for this visit: Treatment Team:  Attending Provider: Brand Males, MD    07/27/2021 -follow-up advanced COPD obesity chronic hypoxic respiratory failure and lack of motivation.  History of pneumonia in April 2022   HPI Isaac [name] 65 y.o. -last seen by myself in June 22.  After that he see nurse practitioner.  He was so deconditioned.  He also has significant lack of motivation.  Therefore his be bringing him in for close follow-up.  He is now got home palliative care and home physical therapy set up by my nurse practitioner.  The wife and his daughter who are with him say that he spends all his time in bed watching TV and on the laptop.  The only thing he will ever get up and goes to the toilet.  Physical therapy is now making him sit in the chair that is right next to the bed for 30 minutes and a single stretch once a day.  Patient does not feel more motivated than this.  He is also reporting a clicking noise in the left side of his chest when he lies down.  Happens once or twice a week has been going on for couple of months.  When he turns to his side it goes away.  He is wondering what this is.  In April 2022 his CT scan showed scattered infiltrates.  He does have some mucus production.  Otherwise he is compliant with nebulizers and his oxygen.      Results for Isaac [name], Isaac [name] (MRN [id number]) as of 05/08/2021 14:06  Ref. Range 03/07/2021 04:21  FIO2 Unknown 40.00  pH, Arterial Latest Ref Range: 7.350 - 7.450  7.425  pCO2 arterial Latest Ref Range: 32.0 - 48.0 mmHg 49.9 (H)  pO2, Arterial Latest Ref Range: 83.0 - 108.0 mmHg 76.9 (L)     CAT COPD Symptom & Quality of Life Score (GSK trademark) 0 is no burden. 5 is highest burden 01/07/2018  09/05/2018   Never Cough -> Cough all the time 3 1  No phlegm in chest ->  Chest is full of phlegm 2 1  No chest tightness -> Chest feels very tight 1 1  No dyspnea for 1 flight stairs/hill -> Very dyspneic for 1 flight of stairs 5 5  No limitations for ADL at home -> Very limited with ADL at home 4 5  Confident leaving home -> Not at all confident leaving home 5 5  Sleep soundly -> Do not sleep soundly because of lung condition 3 5  Lots of Energy -> No energy at all 4 5  TOTAL Score (max 40)  27 28      PFT  PFT Results Latest Ref Rng & Units 06/08/2014  FVC-Pre L 2.09  FVC-Predicted Pre % 42  FVC-Post L 2.17  FVC-Predicted Post % 44  Pre FEV1/FVC % % 39  Post FEV1/FCV % % 42  FEV1-Pre L 0.83  FEV1-Predicted Pre % 22  FEV1-Post L 0.90  DLCO uncorrected ml/min/mmHg 8.83  DLCO UNC% % 26  DLVA Predicted % 47       has a past medical history of Acute on chronic respiratory failure with hypoxia (HCC), Anxiety, Chronic lymphocytic leukemia in remission (HCC), COPD (chronic obstructive pulmonary disease) ([place]), COPD, severe (HCC), GERD (gastroesophageal reflux disease), Hematuria, Hyperlipidemia, Hypertension, Insomnia, Left lower lobe pneumonia, Nephrolithiasis, and Tobacco dependence.   reports that he quit smoking about 12 years ago. His smoking use included cigarettes. He has a 35.00 pack-year smoking history. He has never used smokeless tobacco.  Past Surgical History:  Procedure Laterality Date   LITHOTRIPSY      Allergies  Allergen Reactions   Codeine Itching and Other (See Comments)    "jittery"   Prozac [Fluoxetine Hcl] Other (See Comments)    confusion    Immunization History  Administered Date(s) Administered   Influenza Split 09/27/2011, 08/20/2012, 07/27/2013   Influenza, High Dose Seasonal PF 09/05/2018   Influenza, Quadrivalent, Recombinant, Inj, Pf 09/25/2019   Influenza,inj,Quad PF,6+ Mos 08/22/2015, 10/26/2016, 01/07/2018, 08/12/2020   Influenza-Unspecified 12/24/2011, 09/06/2012, 10/19/2013, 08/31/2015, 08/15/2016,  10/12/2019   PFIZER(Purple Top)SARS-COV-2 Vaccination 03/23/2020, 04/13/2020   Pneumococcal Conjugate-13 08/22/2015, 03/22/2021   Pneumococcal Polysaccharide-23 07/23/2012, 09/06/2012   Tdap 10/01/2012    Family History  Problem Relation Age of Onset   Heart disease Father    Stroke Mother    Leukemia Brother      Current Outpatient Medications:    albuterol (PROVENTIL) (2.5 MG/3ML) 0.083% nebulizer solution, Take 3 mLs (2.5 mg total) by nebulization every 4 (four) hours as needed for wheezing or shortness of breath., Disp: 75 mL, Rfl: 12   albuterol (VENTOLIN HFA) 108 (90 Base) MCG/ACT inhaler, INHALE 2 PUFFS INTO THE LUNGS EVERY 4 (FOUR) HOURS AS NEEDED FOR WHEEZING OR SHORTNESS OF BREATH., Disp: 8.5 each, Rfl: 5   allopurinol (ZYLOPRIM) 100 MG tablet, Place 1 tablet (100 mg total) into feeding tube daily., Disp: 90 tablet, Rfl: 3   budesonide (PULMICORT) 0.5 MG/2ML nebulizer solution, TAKE 2 ML BY NEBULIZATION 2 TIMES DAILY., Disp: 360 mL, Rfl: 5   cloNIDine (CATAPRES - DOSED IN MG/24 HR) 0.1 mg/24hr patch, Place 0.1 mg onto the skin once a week. Tuesdays, Disp: , Rfl:    docusate sodium (COLACE) 100 MG capsule, Take 100 mg by mouth at bedtime., Disp: , Rfl:    Ensure (ENSURE), Take 237 mLs by mouth daily. Chocolate flavor, Disp: , Rfl:    furosemide (LASIX) 40 MG tablet, Take 40 mg by mouth daily., Disp: , Rfl:    guaiFENesin (MUCINEX) 600 MG 12 hr tablet, Take 600 mg by mouth daily., Disp: , Rfl:    ibrutinib (IMBRUVICA) 420 MG TABS, Take 1 tablet (420 mg) by mouth daily., Disp: 30 tablet, Rfl: 3   ipratropium-albuterol (DUONEB) 0.5-2.5 (3) MG/3ML SOLN, TAKE 3 MLS (ONE VIAL) BY NEBULIZATION EVERY 6 (SIX) HOURS AS NEEDED., Disp: 360 mL, Rfl: 3   metoprolol tartrate (LOPRESSOR) 25 MG tablet, Take 25 mg by mouth 2 (two) times daily., Disp: , Rfl:    montelukast (SINGULAIR) 10 MG tablet, Take 10 mg by mouth every evening., Disp: , Rfl:    pantoprazole (PROTONIX) 40 MG tablet, Take 40 mg  by mouth at bedtime., Disp: , Rfl:    polyethylene glycol (MIRALAX / GLYCOLAX) 17 g packet, Place 17 g into feeding tube daily as needed for moderate constipation. (  Patient taking differently: Take 17 g by mouth as needed for moderate constipation.), Disp: 14 each, Rfl: 0   sodium chloride (OCEAN) 0.65 % SOLN nasal spray, Place 2 sprays into both nostrils as needed for congestion., Disp: , Rfl: 0      Objective:   Vitals:   07/27/21 1610  BP: 120/82  Pulse: 88  Temp: 98.2 F (36.8 C)  TempSrc: Oral  SpO2: 97%  Weight: 215 lb (97.5 kg)  Height: _0  (1.778 m)    Estimated body mass index is 30.85 kg/m as calculated from the following:   Height as of this encounter: _1  (1.778 m).   Weight as of this encounter: 215 lb (97.5 kg).  _2 @  Filed Weights   07/27/21 1610  Weight: 215 lb (97.5 kg)     Physical Exam    General: No distress.  Obese deconditioned male sitting on the wheelchair.  Has a big beard. Neuro: Alert and Oriented x 3. GCS 15. Speech normal Psych: Pleasant Resp:  Barrel Chest -yes somewhat.  Wheeze - no, Crackles - no, No overt respiratory distress CVS: Normal heart sounds. Murmurs - no Ext: Stigmata of Connective Tissue Disease - no HEENT: Normal upper airway. PEERL +. No post nasal drip Skin with excoriation        Assessment:       ICD-10-CM   1. Chronic respiratory failure with hypercapnia (HCC)  J96.12     2. COPD, very severe ([place])  J44.9     3. Physical deconditioning  R53.81     4. Lack of motivation  Z91.89     5. History of pneumonia  Z87.01          Plan:     Patient Instructions  Chronic respiratory failure with hypercapnia (HCC) COPD, very severe ([place])  - stable  Plan - continue 2L [place] o2 and nebs as before   History of pneuminia - April 2022 abnormal Ct chest Stability on cxr June 6122 Unclear if clicking noise in chest x 1-2 months is related to this  Plan  - do ct chest without contrast Nov  2022   Physical deconditioning Lack of motivation  - continue to work with home PT and home palliative care; glad Tammy got them involved   FOllowup - Nov 2022 after CT chest     SIGNATURE    Dr. Brand Males, M.D., F.C.C.P,  Pulmonary and Critical Care Medicine Staff Physician, [place] Director - Interstitial Lung Disease  Program  Pulmonary [place] [facility] [place], [place], [address]  Pager: [phone], If no answer or between  15:00h - 7:00h: call [phone] Telephone: [phone]  6:20 PM 07/27/2021

## 2021-07-27 NOTE — Patient Instructions (Addendum)
Chronic respiratory failure with hypercapnia (HCC) COPD, very severe ([place])  - stable  Plan - continue 2L [place] o2 and nebs as before   History of pneuminia - April 2022 abnormal Ct chest Stability on cxr June [id number] Unclear if clicking noise in chest x 1-2 months is related to this  Plan  - do ct chest without contrast Nov 2022   Physical deconditioning Lack of motivation  - continue to work with home PT and home palliative care; glad Tammy got them involved   FOllowup - Nov 2022 after CT chest

## 2021-07-28 NOTE — Addendum Note (Signed)
Addended by: Lorretta Harp on: 07/28/2021 09:48 AM   Modules accepted: Orders

## 2021-08-01 DIAGNOSIS — E669 Obesity, unspecified: Secondary | ICD-10-CM | POA: Diagnosis not present

## 2021-08-01 DIAGNOSIS — N189 Chronic kidney disease, unspecified: Secondary | ICD-10-CM | POA: Diagnosis not present

## 2021-08-01 DIAGNOSIS — J9611 Chronic respiratory failure with hypoxia: Secondary | ICD-10-CM | POA: Diagnosis not present

## 2021-08-01 DIAGNOSIS — I130 Hypertensive heart and chronic kidney disease with heart failure and stage 1 through stage 4 chronic kidney disease, or unspecified chronic kidney disease: Secondary | ICD-10-CM | POA: Diagnosis not present

## 2021-08-01 DIAGNOSIS — J449 Chronic obstructive pulmonary disease, unspecified: Secondary | ICD-10-CM | POA: Diagnosis not present

## 2021-08-01 DIAGNOSIS — G4733 Obstructive sleep apnea (adult) (pediatric): Secondary | ICD-10-CM | POA: Diagnosis not present

## 2021-08-01 DIAGNOSIS — H6122 Impacted cerumen, left ear: Secondary | ICD-10-CM | POA: Diagnosis not present

## 2021-08-01 DIAGNOSIS — D630 Anemia in neoplastic disease: Secondary | ICD-10-CM | POA: Diagnosis not present

## 2021-08-01 DIAGNOSIS — I5032 Chronic diastolic (congestive) heart failure: Secondary | ICD-10-CM | POA: Diagnosis not present

## 2021-08-01 DIAGNOSIS — F419 Anxiety disorder, unspecified: Secondary | ICD-10-CM | POA: Diagnosis not present

## 2021-08-01 DIAGNOSIS — F32A Depression, unspecified: Secondary | ICD-10-CM | POA: Diagnosis not present

## 2021-08-01 DIAGNOSIS — C9111 Chronic lymphocytic leukemia of B-cell type in remission: Secondary | ICD-10-CM | POA: Diagnosis not present

## 2021-08-01 DIAGNOSIS — D631 Anemia in chronic kidney disease: Secondary | ICD-10-CM | POA: Diagnosis not present

## 2021-08-01 DIAGNOSIS — M160 Bilateral primary osteoarthritis of hip: Secondary | ICD-10-CM | POA: Diagnosis not present

## 2021-08-01 DIAGNOSIS — M170 Bilateral primary osteoarthritis of knee: Secondary | ICD-10-CM | POA: Diagnosis not present

## 2021-08-01 DIAGNOSIS — D509 Iron deficiency anemia, unspecified: Secondary | ICD-10-CM | POA: Diagnosis not present

## 2021-08-02 ENCOUNTER — Other Ambulatory Visit (HOSPITAL_COMMUNITY): Payer: Self-pay

## 2021-08-02 MED FILL — METOPROLOL TARTRATE 25 MG TAB: 90 days supply | Qty: 180 | Fill #0

## 2021-08-02 MED FILL — IMBRUVICA 420 MG TABS: 28 days supply | Qty: 28 | Fill #0

## 2021-08-03 DIAGNOSIS — J449 Chronic obstructive pulmonary disease, unspecified: Secondary | ICD-10-CM | POA: Diagnosis not present

## 2021-08-03 DIAGNOSIS — J9620 Acute and chronic respiratory failure, unspecified whether with hypoxia or hypercapnia: Secondary | ICD-10-CM | POA: Diagnosis not present

## 2021-08-03 MED FILL — LEVOFLOXACIN 750 MG TABLET: 7 days supply | Qty: 7 | Fill #0

## 2021-08-03 MED FILL — BENZONATATE 200 MG CAPSULE: 30 days supply | Qty: 90 | Fill #1

## 2021-08-04 ENCOUNTER — Telehealth: Payer: Self-pay | Admitting: Oncology

## 2021-08-04 DIAGNOSIS — F32A Depression, unspecified: Secondary | ICD-10-CM | POA: Diagnosis not present

## 2021-08-04 DIAGNOSIS — D509 Iron deficiency anemia, unspecified: Secondary | ICD-10-CM | POA: Diagnosis not present

## 2021-08-04 DIAGNOSIS — C9111 Chronic lymphocytic leukemia of B-cell type in remission: Secondary | ICD-10-CM | POA: Diagnosis not present

## 2021-08-04 DIAGNOSIS — D630 Anemia in neoplastic disease: Secondary | ICD-10-CM | POA: Diagnosis not present

## 2021-08-04 DIAGNOSIS — M160 Bilateral primary osteoarthritis of hip: Secondary | ICD-10-CM | POA: Diagnosis not present

## 2021-08-04 DIAGNOSIS — I130 Hypertensive heart and chronic kidney disease with heart failure and stage 1 through stage 4 chronic kidney disease, or unspecified chronic kidney disease: Secondary | ICD-10-CM | POA: Diagnosis not present

## 2021-08-04 DIAGNOSIS — J449 Chronic obstructive pulmonary disease, unspecified: Secondary | ICD-10-CM | POA: Diagnosis not present

## 2021-08-04 DIAGNOSIS — H6122 Impacted cerumen, left ear: Secondary | ICD-10-CM | POA: Diagnosis not present

## 2021-08-04 DIAGNOSIS — M170 Bilateral primary osteoarthritis of knee: Secondary | ICD-10-CM | POA: Diagnosis not present

## 2021-08-04 DIAGNOSIS — G4733 Obstructive sleep apnea (adult) (pediatric): Secondary | ICD-10-CM | POA: Diagnosis not present

## 2021-08-04 DIAGNOSIS — I5032 Chronic diastolic (congestive) heart failure: Secondary | ICD-10-CM | POA: Diagnosis not present

## 2021-08-04 DIAGNOSIS — N189 Chronic kidney disease, unspecified: Secondary | ICD-10-CM | POA: Diagnosis not present

## 2021-08-04 DIAGNOSIS — D631 Anemia in chronic kidney disease: Secondary | ICD-10-CM | POA: Diagnosis not present

## 2021-08-04 DIAGNOSIS — J9611 Chronic respiratory failure with hypoxia: Secondary | ICD-10-CM | POA: Diagnosis not present

## 2021-08-04 DIAGNOSIS — E669 Obesity, unspecified: Secondary | ICD-10-CM | POA: Diagnosis not present

## 2021-08-04 DIAGNOSIS — F419 Anxiety disorder, unspecified: Secondary | ICD-10-CM | POA: Diagnosis not present

## 2021-08-04 NOTE — Telephone Encounter (Signed)
Called patient regarding upcoming 09/20 appointment, patient is notified.

## 2021-08-05 DIAGNOSIS — J449 Chronic obstructive pulmonary disease, unspecified: Secondary | ICD-10-CM | POA: Diagnosis not present

## 2021-08-05 DIAGNOSIS — J9620 Acute and chronic respiratory failure, unspecified whether with hypoxia or hypercapnia: Secondary | ICD-10-CM | POA: Diagnosis not present

## 2021-08-05 MED FILL — BUDESONIDE 0.5 MG/2 ML SUSP: 90 days supply | Qty: 360 | Fill #0

## 2021-08-08 DIAGNOSIS — H6122 Impacted cerumen, left ear: Secondary | ICD-10-CM | POA: Diagnosis not present

## 2021-08-08 DIAGNOSIS — J9611 Chronic respiratory failure with hypoxia: Secondary | ICD-10-CM | POA: Diagnosis not present

## 2021-08-08 DIAGNOSIS — D631 Anemia in chronic kidney disease: Secondary | ICD-10-CM | POA: Diagnosis not present

## 2021-08-08 DIAGNOSIS — E669 Obesity, unspecified: Secondary | ICD-10-CM | POA: Diagnosis not present

## 2021-08-08 DIAGNOSIS — F419 Anxiety disorder, unspecified: Secondary | ICD-10-CM | POA: Diagnosis not present

## 2021-08-08 DIAGNOSIS — J449 Chronic obstructive pulmonary disease, unspecified: Secondary | ICD-10-CM | POA: Diagnosis not present

## 2021-08-08 DIAGNOSIS — G4733 Obstructive sleep apnea (adult) (pediatric): Secondary | ICD-10-CM | POA: Diagnosis not present

## 2021-08-08 DIAGNOSIS — I130 Hypertensive heart and chronic kidney disease with heart failure and stage 1 through stage 4 chronic kidney disease, or unspecified chronic kidney disease: Secondary | ICD-10-CM | POA: Diagnosis not present

## 2021-08-08 DIAGNOSIS — M170 Bilateral primary osteoarthritis of knee: Secondary | ICD-10-CM | POA: Diagnosis not present

## 2021-08-08 DIAGNOSIS — D630 Anemia in neoplastic disease: Secondary | ICD-10-CM | POA: Diagnosis not present

## 2021-08-08 DIAGNOSIS — D509 Iron deficiency anemia, unspecified: Secondary | ICD-10-CM | POA: Diagnosis not present

## 2021-08-08 DIAGNOSIS — M160 Bilateral primary osteoarthritis of hip: Secondary | ICD-10-CM | POA: Diagnosis not present

## 2021-08-08 DIAGNOSIS — I5032 Chronic diastolic (congestive) heart failure: Secondary | ICD-10-CM | POA: Diagnosis not present

## 2021-08-08 DIAGNOSIS — N189 Chronic kidney disease, unspecified: Secondary | ICD-10-CM | POA: Diagnosis not present

## 2021-08-08 DIAGNOSIS — C9111 Chronic lymphocytic leukemia of B-cell type in remission: Secondary | ICD-10-CM | POA: Diagnosis not present

## 2021-08-08 DIAGNOSIS — F32A Depression, unspecified: Secondary | ICD-10-CM | POA: Diagnosis not present

## 2021-08-09 DIAGNOSIS — F32A Depression, unspecified: Secondary | ICD-10-CM | POA: Diagnosis not present

## 2021-08-09 DIAGNOSIS — F419 Anxiety disorder, unspecified: Secondary | ICD-10-CM | POA: Diagnosis not present

## 2021-08-09 DIAGNOSIS — I130 Hypertensive heart and chronic kidney disease with heart failure and stage 1 through stage 4 chronic kidney disease, or unspecified chronic kidney disease: Secondary | ICD-10-CM | POA: Diagnosis not present

## 2021-08-09 DIAGNOSIS — M170 Bilateral primary osteoarthritis of knee: Secondary | ICD-10-CM | POA: Diagnosis not present

## 2021-08-09 DIAGNOSIS — H6122 Impacted cerumen, left ear: Secondary | ICD-10-CM | POA: Diagnosis not present

## 2021-08-09 DIAGNOSIS — G4733 Obstructive sleep apnea (adult) (pediatric): Secondary | ICD-10-CM | POA: Diagnosis not present

## 2021-08-09 DIAGNOSIS — J449 Chronic obstructive pulmonary disease, unspecified: Secondary | ICD-10-CM | POA: Diagnosis not present

## 2021-08-09 DIAGNOSIS — D509 Iron deficiency anemia, unspecified: Secondary | ICD-10-CM | POA: Diagnosis not present

## 2021-08-09 DIAGNOSIS — C9111 Chronic lymphocytic leukemia of B-cell type in remission: Secondary | ICD-10-CM | POA: Diagnosis not present

## 2021-08-09 DIAGNOSIS — J9611 Chronic respiratory failure with hypoxia: Secondary | ICD-10-CM | POA: Diagnosis not present

## 2021-08-09 DIAGNOSIS — E669 Obesity, unspecified: Secondary | ICD-10-CM | POA: Diagnosis not present

## 2021-08-09 DIAGNOSIS — N189 Chronic kidney disease, unspecified: Secondary | ICD-10-CM | POA: Diagnosis not present

## 2021-08-09 DIAGNOSIS — I5032 Chronic diastolic (congestive) heart failure: Secondary | ICD-10-CM | POA: Diagnosis not present

## 2021-08-09 DIAGNOSIS — D630 Anemia in neoplastic disease: Secondary | ICD-10-CM | POA: Diagnosis not present

## 2021-08-09 DIAGNOSIS — D631 Anemia in chronic kidney disease: Secondary | ICD-10-CM | POA: Diagnosis not present

## 2021-08-09 DIAGNOSIS — M160 Bilateral primary osteoarthritis of hip: Secondary | ICD-10-CM | POA: Diagnosis not present

## 2021-08-10 ENCOUNTER — Telehealth: Payer: Self-pay | Admitting: *Deleted

## 2021-08-10 NOTE — Telephone Encounter (Signed)
-----   Message from Wyatt Portela, MD sent at 08/10/2021 12:16 PM EDT ----- Regarding: RE: Hypertension It's unlikely the imbruvica. I agree with calling his PCP. Thanks ----- Message ----- From: Rolene Course, RN Sent: 08/10/2021  12:03 PM EDT To: Wyatt Portela, MD Subject: Hypertension                                   Mr Papaleo' wife called, he was seen by his home health nurse yesterday, was told his BP was high.  His wife checked it last night & today - has been 184/103, 169/105.  She is worried that it could be a side effect of his imbruvica.  He is on BP meds prescribed by his PCP, I suggested that she call this office.  Do you have any other advice?  Thanks, Bethena Roys

## 2021-08-10 NOTE — Telephone Encounter (Signed)
PC to patient's wife, Drenda Freeze.  Informed her that Dr. Alen Blew does not think the patient's hypertension is related to the imbruvica.  Instructed her to contact the patient's PCP who prescribes his BP meds with these concerns.  She verbalizes understanding.

## 2021-08-15 ENCOUNTER — Inpatient Hospital Stay: Payer: BC Managed Care – PPO | Attending: Oncology | Admitting: Oncology

## 2021-08-15 DIAGNOSIS — I5032 Chronic diastolic (congestive) heart failure: Secondary | ICD-10-CM | POA: Diagnosis not present

## 2021-08-15 DIAGNOSIS — D509 Iron deficiency anemia, unspecified: Secondary | ICD-10-CM | POA: Diagnosis not present

## 2021-08-15 DIAGNOSIS — C9110 Chronic lymphocytic leukemia of B-cell type not having achieved remission: Secondary | ICD-10-CM | POA: Diagnosis not present

## 2021-08-15 DIAGNOSIS — E669 Obesity, unspecified: Secondary | ICD-10-CM | POA: Diagnosis not present

## 2021-08-15 DIAGNOSIS — G4733 Obstructive sleep apnea (adult) (pediatric): Secondary | ICD-10-CM | POA: Diagnosis not present

## 2021-08-15 DIAGNOSIS — D630 Anemia in neoplastic disease: Secondary | ICD-10-CM | POA: Diagnosis not present

## 2021-08-15 DIAGNOSIS — I130 Hypertensive heart and chronic kidney disease with heart failure and stage 1 through stage 4 chronic kidney disease, or unspecified chronic kidney disease: Secondary | ICD-10-CM | POA: Diagnosis not present

## 2021-08-15 DIAGNOSIS — M170 Bilateral primary osteoarthritis of knee: Secondary | ICD-10-CM | POA: Diagnosis not present

## 2021-08-15 DIAGNOSIS — F32A Depression, unspecified: Secondary | ICD-10-CM | POA: Diagnosis not present

## 2021-08-15 DIAGNOSIS — N189 Chronic kidney disease, unspecified: Secondary | ICD-10-CM | POA: Diagnosis not present

## 2021-08-15 DIAGNOSIS — D631 Anemia in chronic kidney disease: Secondary | ICD-10-CM | POA: Diagnosis not present

## 2021-08-15 DIAGNOSIS — J449 Chronic obstructive pulmonary disease, unspecified: Secondary | ICD-10-CM | POA: Diagnosis not present

## 2021-08-15 DIAGNOSIS — C9111 Chronic lymphocytic leukemia of B-cell type in remission: Secondary | ICD-10-CM | POA: Diagnosis not present

## 2021-08-15 DIAGNOSIS — H6122 Impacted cerumen, left ear: Secondary | ICD-10-CM | POA: Diagnosis not present

## 2021-08-15 DIAGNOSIS — J9611 Chronic respiratory failure with hypoxia: Secondary | ICD-10-CM | POA: Diagnosis not present

## 2021-08-15 DIAGNOSIS — F419 Anxiety disorder, unspecified: Secondary | ICD-10-CM | POA: Diagnosis not present

## 2021-08-15 DIAGNOSIS — M160 Bilateral primary osteoarthritis of hip: Secondary | ICD-10-CM | POA: Diagnosis not present

## 2021-08-15 NOTE — Progress Notes (Signed)
Hematology and Oncology Follow Up for Telemedicine Visits  Isaac [name] [id number] [birth year] 65 y.o. 08/15/2021 9:10 AM [facility], [place] *   I connected with Mr. Isaac [name] on 08/15/21 at  9:30 AM EDT by telephone visit and verified that I am speaking with the correct person using two identifiers.   I discussed the limitations, risks, security and privacy concerns of performing an evaluation and management service by telemedicine and the availability of in-person appointments. I also discussed with the patient that there may be a patient responsible charge related to this service. The patient expressed understanding and agreed to proceed.  Other persons participating in the visit and their role in the encounter: His wife Isaac [name]  Patient's location:  Home Provider's location:  Office   Principle Diagnosis: 65 year old man with CLL presented with lymphocytosis lymphadenopathy diagnosed in January 2019.       Current therapy: Imbruvica 420 mg daily started in May 2022.    Interim History: Mr.Isaac [name] reports no major changes in his health.  He continues to tolerate Imbruvica without any recent complaints.  He denies any painful adenopathy or petechiae.  She denies any constitutional symptoms.  His performance status and quality of life remained stable.     Medications: Updated on review. Current Outpatient Medications  Medication Sig Dispense Refill   albuterol (PROVENTIL) (2.5 MG/3ML) 0.083% nebulizer solution Take 3 mLs (2.5 mg total) by nebulization every 4 (four) hours as needed for wheezing or shortness of breath. 75 mL 12   albuterol (VENTOLIN HFA) 108 (90 Base) MCG/ACT inhaler INHALE 2 PUFFS INTO THE LUNGS EVERY 4 (FOUR) HOURS AS NEEDED FOR WHEEZING OR SHORTNESS OF BREATH. 8.5 each 5   allopurinol (ZYLOPRIM) 100 MG tablet Place 1 tablet (100 mg total) into feeding tube daily. 90 tablet 3   budesonide (PULMICORT) 0.5 MG/2ML nebulizer solution  TAKE 2 ML BY NEBULIZATION 2 TIMES DAILY. 360 mL 5   cloNIDine (CATAPRES - DOSED IN MG/24 HR) 0.1 mg/24hr patch Place 0.1 mg onto the skin once a week. Tuesdays     docusate sodium (COLACE) 100 MG capsule Take 100 mg by mouth at bedtime.     Ensure (ENSURE) Take 237 mLs by mouth daily. Chocolate flavor     furosemide (LASIX) 40 MG tablet Take 40 mg by mouth daily.     guaiFENesin (MUCINEX) 600 MG 12 hr tablet Take 600 mg by mouth daily.     ibrutinib (IMBRUVICA) 420 MG TABS Take 1 tablet (420 mg) by mouth daily. 30 tablet 3   ipratropium-albuterol (DUONEB) 0.5-2.5 (3) MG/3ML SOLN TAKE 3 MLS (ONE VIAL) BY NEBULIZATION EVERY 6 (SIX) HOURS AS NEEDED. 360 mL 3   metoprolol tartrate (LOPRESSOR) 25 MG tablet Take 25 mg by mouth 2 (two) times daily.     montelukast (SINGULAIR) 10 MG tablet Take 10 mg by mouth every evening.     pantoprazole (PROTONIX) 40 MG tablet Take 40 mg by mouth at bedtime.     polyethylene glycol (MIRALAX / GLYCOLAX) 17 g packet Place 17 g into feeding tube daily as needed for moderate constipation. (Patient taking differently: Take 17 g by mouth as needed for moderate constipation.) 14 each 0   sodium chloride (OCEAN) 0.65 % SOLN nasal spray Place 2 sprays into both nostrils as needed for congestion.  0   No current facility-administered medications for this visit.     Allergies:  Allergies  Allergen Reactions   Codeine Itching and Other (See Comments)    "jittery"  Prozac [Fluoxetine Hcl] Other (See Comments)    confusion        Lab Results: Lab Results  Component Value Date   WBC 96.7 (HH) 05/12/2021   HGB 11.4 (L) 05/12/2021   HCT 39.0 05/12/2021   MCV 87.6 05/12/2021   PLT 244 05/12/2021     Chemistry      Component Value Date/Time   NA 141 05/12/2021 1333   K 4.1 05/12/2021 1333   CL 97 (L) 05/12/2021 1333   CO2 37 (H) 05/12/2021 1333   BUN 16 05/12/2021 1333   CREATININE 0.93 05/12/2021 1333      Component Value Date/Time   CALCIUM 9.0  05/12/2021 1333   ALKPHOS 70 05/12/2021 1333   AST 12 (L) 05/12/2021 1333   ALT 8 05/12/2021 1333   BILITOT 1.1 05/12/2021 1333         Impression and Plan:  65 year old man:   1.      Stage I CLL presented with lymphocytosis and adenopathy diagnosed in 2019.      His disease status was updated and laboratory data reviewed from July 07, 2021.  His total white cell count is 81 with lymphocyte percentage is 84.5.  His hemoglobin is 11.7 with normal platelets.  His electrolytes including calcium, kidney function are all within normal range.  Risks and benefits of continuing this treatment were discussed at this time.  Complications including bleeding cardiac issues including palpitation and arrhythmia were reiterated.  He is agreeable to continue at this time.   2.  Tumor lysis syndrome: No evidence of tumor lysis noted at this time based on recent labs.  We will continue to monitor.     3  Follow-up: Will be on September 11, 2021 for repeat lab and physical examination.     I discussed the assessment and treatment plan with the patient. The patient was provided an opportunity to ask questions and all were answered. The patient agreed with the plan and demonstrated an understanding of the instructions.   The patient was advised to call back or seek an in-person evaluation if the symptoms worsen or if the condition fails to improve as anticipated.  I provided 20 minutes of non face-to-face telephone visit time during this encounter.  Time was dedicated to reviewing laboratory data, disease status update, treatment choices and future plan of care review.  Zola Button, MD 08/15/2021 9:10 AM

## 2021-08-18 DIAGNOSIS — C44629 Squamous cell carcinoma of skin of left upper limb, including shoulder: Secondary | ICD-10-CM | POA: Diagnosis not present

## 2021-08-19 DIAGNOSIS — F32A Depression, unspecified: Secondary | ICD-10-CM | POA: Diagnosis not present

## 2021-08-19 DIAGNOSIS — D509 Iron deficiency anemia, unspecified: Secondary | ICD-10-CM | POA: Diagnosis not present

## 2021-08-19 DIAGNOSIS — H6122 Impacted cerumen, left ear: Secondary | ICD-10-CM | POA: Diagnosis not present

## 2021-08-19 DIAGNOSIS — J449 Chronic obstructive pulmonary disease, unspecified: Secondary | ICD-10-CM | POA: Diagnosis not present

## 2021-08-19 DIAGNOSIS — E669 Obesity, unspecified: Secondary | ICD-10-CM | POA: Diagnosis not present

## 2021-08-19 DIAGNOSIS — G4733 Obstructive sleep apnea (adult) (pediatric): Secondary | ICD-10-CM | POA: Diagnosis not present

## 2021-08-19 DIAGNOSIS — J9611 Chronic respiratory failure with hypoxia: Secondary | ICD-10-CM | POA: Diagnosis not present

## 2021-08-19 DIAGNOSIS — D630 Anemia in neoplastic disease: Secondary | ICD-10-CM | POA: Diagnosis not present

## 2021-08-19 DIAGNOSIS — D631 Anemia in chronic kidney disease: Secondary | ICD-10-CM | POA: Diagnosis not present

## 2021-08-19 DIAGNOSIS — C9111 Chronic lymphocytic leukemia of B-cell type in remission: Secondary | ICD-10-CM | POA: Diagnosis not present

## 2021-08-19 DIAGNOSIS — F419 Anxiety disorder, unspecified: Secondary | ICD-10-CM | POA: Diagnosis not present

## 2021-08-19 DIAGNOSIS — I130 Hypertensive heart and chronic kidney disease with heart failure and stage 1 through stage 4 chronic kidney disease, or unspecified chronic kidney disease: Secondary | ICD-10-CM | POA: Diagnosis not present

## 2021-08-19 DIAGNOSIS — I5032 Chronic diastolic (congestive) heart failure: Secondary | ICD-10-CM | POA: Diagnosis not present

## 2021-08-19 DIAGNOSIS — M170 Bilateral primary osteoarthritis of knee: Secondary | ICD-10-CM | POA: Diagnosis not present

## 2021-08-19 DIAGNOSIS — N189 Chronic kidney disease, unspecified: Secondary | ICD-10-CM | POA: Diagnosis not present

## 2021-08-19 DIAGNOSIS — M160 Bilateral primary osteoarthritis of hip: Secondary | ICD-10-CM | POA: Diagnosis not present

## 2021-08-21 ENCOUNTER — Emergency Department (HOSPITAL_COMMUNITY): Payer: BC Managed Care – PPO

## 2021-08-21 ENCOUNTER — Inpatient Hospital Stay (HOSPITAL_COMMUNITY)
Admission: EM | Admit: 2021-08-21 | Discharge: 2021-08-24 | DRG: 189 | Disposition: A | Discharge status: Home / Self Care | Payer: BC Managed Care – PPO | Attending: Family Medicine | Admitting: Family Medicine

## 2021-08-21 ENCOUNTER — Other Ambulatory Visit: Payer: Self-pay

## 2021-08-21 DIAGNOSIS — J9602 Acute respiratory failure with hypercapnia: Secondary | ICD-10-CM | POA: Diagnosis not present

## 2021-08-21 DIAGNOSIS — R0902 Hypoxemia: Secondary | ICD-10-CM | POA: Diagnosis not present

## 2021-08-21 DIAGNOSIS — J811 Chronic pulmonary edema: Secondary | ICD-10-CM | POA: Diagnosis not present

## 2021-08-21 DIAGNOSIS — Z6831 Body mass index (BMI) 31.0-31.9, adult: Secondary | ICD-10-CM

## 2021-08-21 DIAGNOSIS — I4510 Unspecified right bundle-branch block: Secondary | ICD-10-CM | POA: Diagnosis not present

## 2021-08-21 DIAGNOSIS — Z66 Do not resuscitate: Secondary | ICD-10-CM | POA: Diagnosis present

## 2021-08-21 DIAGNOSIS — I5033 Acute on chronic diastolic (congestive) heart failure: Secondary | ICD-10-CM | POA: Diagnosis present

## 2021-08-21 DIAGNOSIS — J439 Emphysema, unspecified: Secondary | ICD-10-CM | POA: Diagnosis not present

## 2021-08-21 DIAGNOSIS — Z885 Allergy status to narcotic agent status: Secondary | ICD-10-CM

## 2021-08-21 DIAGNOSIS — R0689 Other abnormalities of breathing: Secondary | ICD-10-CM | POA: Diagnosis not present

## 2021-08-21 DIAGNOSIS — K219 Gastro-esophageal reflux disease without esophagitis: Secondary | ICD-10-CM | POA: Diagnosis not present

## 2021-08-21 DIAGNOSIS — Z20822 Contact with and (suspected) exposure to covid-19: Secondary | ICD-10-CM | POA: Diagnosis present

## 2021-08-21 DIAGNOSIS — R092 Respiratory arrest: Secondary | ICD-10-CM | POA: Diagnosis not present

## 2021-08-21 DIAGNOSIS — R0603 Acute respiratory distress: Secondary | ICD-10-CM

## 2021-08-21 DIAGNOSIS — G9349 Other encephalopathy: Secondary | ICD-10-CM | POA: Diagnosis present

## 2021-08-21 DIAGNOSIS — K5900 Constipation, unspecified: Secondary | ICD-10-CM | POA: Diagnosis present

## 2021-08-21 DIAGNOSIS — I5032 Chronic diastolic (congestive) heart failure: Secondary | ICD-10-CM | POA: Diagnosis not present

## 2021-08-21 DIAGNOSIS — Z87442 Personal history of urinary calculi: Secondary | ICD-10-CM

## 2021-08-21 DIAGNOSIS — I959 Hypotension, unspecified: Secondary | ICD-10-CM | POA: Diagnosis not present

## 2021-08-21 DIAGNOSIS — E669 Obesity, unspecified: Secondary | ICD-10-CM | POA: Diagnosis present

## 2021-08-21 DIAGNOSIS — E785 Hyperlipidemia, unspecified: Secondary | ICD-10-CM | POA: Diagnosis not present

## 2021-08-21 DIAGNOSIS — C9110 Chronic lymphocytic leukemia of B-cell type not having achieved remission: Secondary | ICD-10-CM | POA: Diagnosis present

## 2021-08-21 DIAGNOSIS — C9111 Chronic lymphocytic leukemia of B-cell type in remission: Secondary | ICD-10-CM | POA: Diagnosis present

## 2021-08-21 DIAGNOSIS — J189 Pneumonia, unspecified organism: Secondary | ICD-10-CM | POA: Diagnosis not present

## 2021-08-21 DIAGNOSIS — Z806 Family history of leukemia: Secondary | ICD-10-CM

## 2021-08-21 DIAGNOSIS — I110 Hypertensive heart disease with heart failure: Secondary | ICD-10-CM | POA: Diagnosis not present

## 2021-08-21 DIAGNOSIS — J449 Chronic obstructive pulmonary disease, unspecified: Secondary | ICD-10-CM | POA: Diagnosis not present

## 2021-08-21 DIAGNOSIS — J9601 Acute respiratory failure with hypoxia: Secondary | ICD-10-CM

## 2021-08-21 DIAGNOSIS — Z8249 Family history of ischemic heart disease and other diseases of the circulatory system: Secondary | ICD-10-CM | POA: Diagnosis not present

## 2021-08-21 DIAGNOSIS — T8149XA Infection following a procedure, other surgical site, initial encounter: Secondary | ICD-10-CM | POA: Diagnosis not present

## 2021-08-21 DIAGNOSIS — F419 Anxiety disorder, unspecified: Secondary | ICD-10-CM | POA: Diagnosis present

## 2021-08-21 DIAGNOSIS — Z5320 Procedure and treatment not carried out because of patient's decision for unspecified reasons: Secondary | ICD-10-CM | POA: Diagnosis not present

## 2021-08-21 DIAGNOSIS — Y848 Other medical procedures as the cause of abnormal reaction of the patient, or of later complication, without mention of misadventure at the time of the procedure: Secondary | ICD-10-CM | POA: Diagnosis present

## 2021-08-21 DIAGNOSIS — E662 Morbid (severe) obesity with alveolar hypoventilation: Secondary | ICD-10-CM | POA: Diagnosis not present

## 2021-08-21 DIAGNOSIS — Z888 Allergy status to other drugs, medicaments and biological substances status: Secondary | ICD-10-CM

## 2021-08-21 DIAGNOSIS — E8770 Fluid overload, unspecified: Secondary | ICD-10-CM | POA: Diagnosis not present

## 2021-08-21 DIAGNOSIS — J9622 Acute and chronic respiratory failure with hypercapnia: Secondary | ICD-10-CM | POA: Diagnosis present

## 2021-08-21 DIAGNOSIS — T8141XA Infection following a procedure, superficial incisional surgical site, initial encounter: Secondary | ICD-10-CM | POA: Diagnosis present

## 2021-08-21 DIAGNOSIS — J9621 Acute and chronic respiratory failure with hypoxia: Secondary | ICD-10-CM | POA: Diagnosis not present

## 2021-08-21 DIAGNOSIS — Z7951 Long term (current) use of inhaled steroids: Secondary | ICD-10-CM

## 2021-08-21 DIAGNOSIS — Z87891 Personal history of nicotine dependence: Secondary | ICD-10-CM | POA: Diagnosis not present

## 2021-08-21 DIAGNOSIS — R739 Hyperglycemia, unspecified: Secondary | ICD-10-CM | POA: Diagnosis present

## 2021-08-21 DIAGNOSIS — I517 Cardiomegaly: Secondary | ICD-10-CM | POA: Diagnosis not present

## 2021-08-21 DIAGNOSIS — R0602 Shortness of breath: Secondary | ICD-10-CM | POA: Diagnosis not present

## 2021-08-21 DIAGNOSIS — J9620 Acute and chronic respiratory failure, unspecified whether with hypoxia or hypercapnia: Secondary | ICD-10-CM | POA: Diagnosis present

## 2021-08-21 DIAGNOSIS — R4182 Altered mental status, unspecified: Secondary | ICD-10-CM | POA: Diagnosis not present

## 2021-08-21 DIAGNOSIS — J90 Pleural effusion, not elsewhere classified: Secondary | ICD-10-CM | POA: Diagnosis not present

## 2021-08-21 DIAGNOSIS — Z9981 Dependence on supplemental oxygen: Secondary | ICD-10-CM

## 2021-08-21 DIAGNOSIS — J441 Chronic obstructive pulmonary disease with (acute) exacerbation: Secondary | ICD-10-CM | POA: Diagnosis not present

## 2021-08-21 DIAGNOSIS — R918 Other nonspecific abnormal finding of lung field: Secondary | ICD-10-CM | POA: Diagnosis not present

## 2021-08-21 DIAGNOSIS — T380X5A Adverse effect of glucocorticoids and synthetic analogues, initial encounter: Secondary | ICD-10-CM | POA: Diagnosis present

## 2021-08-21 DIAGNOSIS — R404 Transient alteration of awareness: Secondary | ICD-10-CM | POA: Diagnosis not present

## 2021-08-21 DIAGNOSIS — J9690 Respiratory failure, unspecified, unspecified whether with hypoxia or hypercapnia: Secondary | ICD-10-CM | POA: Diagnosis not present

## 2021-08-21 DIAGNOSIS — Z79899 Other long term (current) drug therapy: Secondary | ICD-10-CM

## 2021-08-21 LAB — COMPREHENSIVE METABOLIC PANEL
ALT: 8 U/L (ref 0–44)
AST: 16 U/L (ref 15–41)
Albumin: 3.1 g/dL — ABNORMAL LOW (ref 3.5–5.0)
Alkaline Phosphatase: 92 U/L (ref 38–126)
Anion gap: 7 (ref 5–15)
BUN: 13 mg/dL (ref 8–23)
CO2: 33 mmol/L — ABNORMAL HIGH (ref 22–32)
Calcium: 8.6 mg/dL — ABNORMAL LOW (ref 8.9–10.3)
Chloride: 100 mmol/L (ref 98–111)
Creatinine, Ser: 0.96 mg/dL (ref 0.61–1.24)
GFR, Estimated: >60 mL/min (ref >60)
Glucose, Bld: 272 mg/dL — ABNORMAL HIGH (ref 70–99)
Potassium: 4.9 mmol/L (ref 3.5–5.1)
Sodium: 140 mmol/L (ref 135–145)
Total Bilirubin: 0.6 mg/dL (ref 0.3–1.2)
Total Protein: 5.8 g/dL — ABNORMAL LOW (ref 6.5–8.1)

## 2021-08-21 LAB — I-STAT VENOUS BLOOD GAS, ED
Acid-Base Excess: 6 mmol/L — ABNORMAL HIGH (ref 0.0–2.0)
Bicarbonate: 35.7 mmol/L — ABNORMAL HIGH (ref 20.0–28.0)
Calcium, Ion: 1.12 mmol/L — ABNORMAL LOW (ref 1.15–1.40)
HCT: 41 % (ref 39.0–52.0)
Hemoglobin: 13.9 g/dL (ref 13.0–17.0)
O2 Saturation: 98 %
Potassium: 4.7 mmol/L (ref 3.5–5.1)
Sodium: 139 mmol/L (ref 135–145)
TCO2: 38 mmol/L — ABNORMAL HIGH (ref 22–32)
pCO2, Ven: 80.8 mmHg (ref 44.0–60.0)
pH, Ven: 7.254 (ref 7.250–7.430)
pO2, Ven: 125 mmHg — ABNORMAL HIGH (ref 32.0–45.0)

## 2021-08-21 LAB — LACTIC ACID, PLASMA
Lactic Acid, Venous: 1.4 mmol/L (ref 0.5–1.9)
Lactic Acid, Venous: 1.7 mmol/L (ref 0.5–1.9)
Lactic Acid, Venous: 2.9 mmol/L (ref 0.5–1.9)

## 2021-08-21 LAB — I-STAT ARTERIAL BLOOD GAS, ED
Acid-Base Excess: 8 mmol/L — ABNORMAL HIGH (ref 0.0–2.0)
Acid-Base Excess: 8 mmol/L — ABNORMAL HIGH (ref 0.0–2.0)
Bicarbonate: 36.7 mmol/L — ABNORMAL HIGH (ref 20.0–28.0)
Bicarbonate: 36.3 mmol/L — ABNORMAL HIGH (ref 20.0–28.0)
Calcium, Ion: 1.21 mmol/L (ref 1.15–1.40)
Calcium, Ion: 1.19 mmol/L (ref 1.15–1.40)
HCT: 38 % — ABNORMAL LOW (ref 39.0–52.0)
HCT: 38 % — ABNORMAL LOW (ref 39.0–52.0)
Hemoglobin: 12.9 g/dL — ABNORMAL LOW (ref 13.0–17.0)
Hemoglobin: 12.9 g/dL — ABNORMAL LOW (ref 13.0–17.0)
O2 Saturation: 89 %
O2 Saturation: 96 %
Potassium: 5 mmol/L (ref 3.5–5.1)
Potassium: 4.8 mmol/L (ref 3.5–5.1)
Sodium: 138 mmol/L (ref 135–145)
Sodium: 138 mmol/L (ref 135–145)
TCO2: 39 mmol/L — ABNORMAL HIGH (ref 22–32)
TCO2: 38 mmol/L — ABNORMAL HIGH (ref 22–32)
pCO2 arterial: 71.7 mmHg (ref 32.0–48.0)
pCO2 arterial: 69.1 mmHg (ref 32.0–48.0)
pH, Arterial: 7.317 — ABNORMAL LOW (ref 7.350–7.450)
pH, Arterial: 7.328 — ABNORMAL LOW (ref 7.350–7.450)
pO2, Arterial: 65 mmHg — ABNORMAL LOW (ref 83.0–108.0)
pO2, Arterial: 88 mmHg (ref 83.0–108.0)

## 2021-08-21 LAB — BRAIN NATRIURETIC PEPTIDE: B Natriuretic Peptide: 142.9 pg/mL — ABNORMAL HIGH (ref 0.0–100.0)

## 2021-08-21 LAB — CBC
HCT: 44.5 % (ref 39.0–52.0)
HCT: 43.7 % (ref 39.0–52.0)
Hemoglobin: 12.6 g/dL — ABNORMAL LOW (ref 13.0–17.0)
Hemoglobin: 12.4 g/dL — ABNORMAL LOW (ref 13.0–17.0)
MCH: 25.5 pg — ABNORMAL LOW (ref 26.0–34.0)
MCH: 25.6 pg — ABNORMAL LOW (ref 26.0–34.0)
MCHC: 28.3 g/dL — ABNORMAL LOW (ref 30.0–36.0)
MCHC: 28.4 g/dL — ABNORMAL LOW (ref 30.0–36.0)
MCV: 90.1 fL (ref 80.0–100.0)
MCV: 90.1 fL (ref 80.0–100.0)
Platelets: 261 10*3/uL (ref 150–400)
Platelets: 279 10*3/uL (ref 150–400)
RBC: 4.94 MIL/uL (ref 4.22–5.81)
RBC: 4.85 MIL/uL (ref 4.22–5.81)
RDW: 14.1 % (ref 11.5–15.5)
RDW: 14.1 % (ref 11.5–15.5)
WBC: 119.2 10*3/uL (ref 4.0–10.5)
WBC: 128.3 10*3/uL (ref 4.0–10.5)
nRBC: 0 % (ref 0.0–0.2)
nRBC: 0 % (ref 0.0–0.2)

## 2021-08-21 LAB — RESP PANEL BY RT-PCR (FLU A&B, COVID) ARPGX2
Influenza A by PCR: NEGATIVE
Influenza B by PCR: NEGATIVE
SARS Coronavirus 2 by RT PCR: NEGATIVE

## 2021-08-21 LAB — BASIC METABOLIC PANEL
Anion gap: 9 (ref 5–15)
BUN: 15 mg/dL (ref 8–23)
CO2: 35 mmol/L — ABNORMAL HIGH (ref 22–32)
Calcium: 9 mg/dL (ref 8.9–10.3)
Chloride: 96 mmol/L — ABNORMAL LOW (ref 98–111)
Creatinine, Ser: 1.09 mg/dL (ref 0.61–1.24)
GFR, Estimated: >60 mL/min (ref >60)
Glucose, Bld: 217 mg/dL — ABNORMAL HIGH (ref 70–99)
Potassium: 4.8 mmol/L (ref 3.5–5.1)
Sodium: 140 mmol/L (ref 135–145)

## 2021-08-21 LAB — TROPONIN I (HIGH SENSITIVITY)
Troponin I (High Sensitivity): 19 ng/L — ABNORMAL HIGH (ref <18)
Troponin I (High Sensitivity): 58 ng/L — ABNORMAL HIGH (ref <18)

## 2021-08-21 LAB — GLUCOSE, CAPILLARY
Glucose-Capillary: 190 mg/dL — ABNORMAL HIGH (ref 70–99)
Glucose-Capillary: 202 mg/dL — ABNORMAL HIGH (ref 70–99)
Glucose-Capillary: 179 mg/dL — ABNORMAL HIGH (ref 70–99)
Glucose-Capillary: 168 mg/dL — ABNORMAL HIGH (ref 70–99)
Glucose-Capillary: 158 mg/dL — ABNORMAL HIGH (ref 70–99)

## 2021-08-21 LAB — HEMOGLOBIN A1C
Hgb A1c MFr Bld: 5.6 % (ref 4.8–5.6)
Mean Plasma Glucose: 114.02 mg/dL

## 2021-08-21 LAB — MAGNESIUM: Magnesium: 2.8 mg/dL — ABNORMAL HIGH (ref 1.7–2.4)

## 2021-08-21 LAB — MRSA NEXT GEN BY PCR, NASAL: MRSA by PCR Next Gen: NOT DETECTED

## 2021-08-21 MED ORDER — ONDANSETRON HCL 4 MG/2ML IJ SOLN: 4.0000 mg | Freq: Four times a day (QID) | INTRAMUSCULAR | Status: DC | PRN

## 2021-08-21 MED ORDER — SALINE SPRAY 0.65 % NA SOLN
2.0000 | NASAL | Status: DC | PRN
  Filled 2021-08-21: qty 44

## 2021-08-21 MED ORDER — ALBUTEROL SULFATE (2.5 MG/3ML) 0.083% IN NEBU: 10.0000 mg | INHALATION_SOLUTION | Freq: Once | RESPIRATORY_TRACT | Status: AC

## 2021-08-21 MED ORDER — IBRUTINIB 420 MG PO TABS
420.0000 mg | ORAL_TABLET | Freq: Every day | ORAL | Status: DC
  Administered 2021-08-22 – 2021-08-24 (×3): 420 mg via ORAL
  Filled 2021-08-21 (×3): qty 1

## 2021-08-21 MED ORDER — VANCOMYCIN HCL 1250 MG/250ML IV SOLN
1250.0000 mg | Freq: Two times a day (BID) | INTRAVENOUS | Status: DC
  Administered 2021-08-21 (×2): 1250 mg via INTRAVENOUS
  Filled 2021-08-21 (×2): qty 250

## 2021-08-21 MED ORDER — IPRATROPIUM BROMIDE 0.02 % IN SOLN
0.5000 mg | Freq: Once | RESPIRATORY_TRACT | Status: AC
  Administered 2021-08-21: 0.5 mg via RESPIRATORY_TRACT

## 2021-08-21 MED ORDER — MONTELUKAST SODIUM 10 MG PO TABS
10.0000 mg | ORAL_TABLET | Freq: Every evening | ORAL | Status: DC
  Administered 2021-08-21 – 2021-08-23 (×3): 10 mg via ORAL
  Filled 2021-08-21 (×3): qty 1

## 2021-08-21 MED ORDER — ALLOPURINOL 100 MG PO TABS: 100.0000 mg | ORAL_TABLET | Freq: Every day | ORAL | Status: DC

## 2021-08-21 MED ORDER — PANTOPRAZOLE SODIUM 40 MG IV SOLR
40.0000 mg | INTRAVENOUS | Status: DC
  Administered 2021-08-21: 40 mg via INTRAVENOUS
  Filled 2021-08-21: qty 40

## 2021-08-21 MED ORDER — NOREPINEPHRINE 4 MG/250ML-% IV SOLN
0.0000 ug/min | INTRAVENOUS | Status: DC
  Administered 2021-08-21: 10 ug/min via INTRAVENOUS
  Filled 2021-08-21: qty 250

## 2021-08-21 MED ORDER — DOCUSATE SODIUM 100 MG PO CAPS
100.0000 mg | ORAL_CAPSULE | Freq: Two times a day (BID) | ORAL | Status: DC
  Administered 2021-08-21 – 2021-08-24 (×5): 100 mg via ORAL
  Filled 2021-08-21 (×6): qty 1

## 2021-08-21 MED ORDER — CHLORHEXIDINE GLUCONATE CLOTH 2 % EX PADS
6.0000 | MEDICATED_PAD | Freq: Every day | CUTANEOUS | Status: DC
  Administered 2021-08-21 – 2021-08-24 (×4): 6 via TOPICAL

## 2021-08-21 MED ORDER — SUCCINYLCHOLINE CHLORIDE 200 MG/10ML IV SOSY
PREFILLED_SYRINGE | INTRAVENOUS | Status: AC
  Filled 2021-08-21: qty 10

## 2021-08-21 MED ORDER — INSULIN ASPART 100 UNIT/ML IJ SOLN
0.0000 [IU] | Freq: Three times a day (TID) | INTRAMUSCULAR | Status: DC
  Administered 2021-08-21 – 2021-08-23 (×6): 2 [IU] via SUBCUTANEOUS
  Administered 2021-08-24: 1 [IU] via SUBCUTANEOUS

## 2021-08-21 MED ORDER — ACETAMINOPHEN 325 MG PO TABS
650.0000 mg | ORAL_TABLET | ORAL | Status: DC | PRN
  Administered 2021-08-21 – 2021-08-24 (×4): 650 mg via ORAL
  Filled 2021-08-21 (×4): qty 2

## 2021-08-21 MED ORDER — SODIUM CHLORIDE 0.9 % IV SOLN
250.0000 mL | INTRAVENOUS | Status: DC
  Administered 2021-08-24: 250 mL via INTRAVENOUS

## 2021-08-21 MED ORDER — ALBUTEROL SULFATE (2.5 MG/3ML) 0.083% IN NEBU: 5.0000 mg/h | INHALATION_SOLUTION | Freq: Once | RESPIRATORY_TRACT | Status: DC

## 2021-08-21 MED ORDER — FUROSEMIDE 10 MG/ML IJ SOLN
40.0000 mg | Freq: Every day | INTRAMUSCULAR | Status: DC
  Administered 2021-08-21 – 2021-08-24 (×4): 40 mg via INTRAVENOUS
  Filled 2021-08-21 (×4): qty 4

## 2021-08-21 MED ORDER — HEPARIN SODIUM (PORCINE) 5000 UNIT/ML IJ SOLN
5000.0000 [IU] | Freq: Three times a day (TID) | INTRAMUSCULAR | Status: DC
  Administered 2021-08-21 – 2021-08-22 (×4): 5000 [IU] via SUBCUTANEOUS
  Filled 2021-08-21 (×5): qty 1

## 2021-08-21 MED ORDER — ALBUTEROL SULFATE (2.5 MG/3ML) 0.083% IN NEBU: 2.5000 mg | INHALATION_SOLUTION | RESPIRATORY_TRACT | Status: DC | PRN

## 2021-08-21 MED ORDER — POLYETHYLENE GLYCOL 3350 17 G PO PACK: 17.0000 g | PACK | Freq: Every day | ORAL | Status: DC | PRN

## 2021-08-21 MED ORDER — BUDESONIDE 0.5 MG/2ML IN SUSP
0.5000 mg | Freq: Two times a day (BID) | RESPIRATORY_TRACT | Status: DC
  Administered 2021-08-21 – 2021-08-24 (×7): 0.5 mg via RESPIRATORY_TRACT
  Filled 2021-08-21 (×7): qty 2

## 2021-08-21 MED ORDER — POLYETHYLENE GLYCOL 3350 17 G PO PACK
17.0000 g | PACK | Freq: Every day | ORAL | Status: DC
  Administered 2021-08-21: 17 g via ORAL
  Filled 2021-08-21 (×2): qty 1

## 2021-08-21 MED ORDER — DOCUSATE SODIUM 100 MG PO CAPS
100.0000 mg | ORAL_CAPSULE | Freq: Two times a day (BID) | ORAL | Status: DC | PRN
  Administered 2021-08-21 (×2): 100 mg via ORAL
  Filled 2021-08-21: qty 1

## 2021-08-21 MED ORDER — ALLOPURINOL 100 MG PO TABS
100.0000 mg | ORAL_TABLET | Freq: Every day | ORAL | Status: DC
  Administered 2021-08-22 – 2021-08-24 (×3): 100 mg via ORAL
  Filled 2021-08-21 (×3): qty 1

## 2021-08-21 MED ORDER — ETOMIDATE 2 MG/ML IV SOLN
INTRAVENOUS | Status: AC
  Filled 2021-08-21: qty 20

## 2021-08-21 MED ORDER — SODIUM CHLORIDE 0.9 % IV SOLN
2.0000 g | INTRAVENOUS | Status: DC
  Administered 2021-08-21 – 2021-08-24 (×4): 2 g via INTRAVENOUS
  Filled 2021-08-21 (×4): qty 20

## 2021-08-21 MED ORDER — DOUBLE ANTIBIOTIC 500-10000 UNIT/GM EX OINT
TOPICAL_OINTMENT | Freq: Two times a day (BID) | CUTANEOUS | Status: DC
  Administered 2021-08-21 – 2021-08-24 (×3): 1 via TOPICAL
  Filled 2021-08-21: qty 28.4

## 2021-08-21 MED ORDER — EPINEPHRINE 0.3 MG/0.3ML IJ SOAJ
0.3000 mg | Freq: Once | INTRAMUSCULAR | Status: AC
  Administered 2021-08-21: 0.3 mg via INTRAMUSCULAR

## 2021-08-21 MED ORDER — NOREPINEPHRINE 4 MG/250ML-% IV SOLN
2.0000 ug/min | INTRAVENOUS | Status: DC
  Administered 2021-08-21: 2 ug/min via INTRAVENOUS
  Filled 2021-08-21: qty 250

## 2021-08-21 MED ORDER — IPRATROPIUM BROMIDE 0.02 % IN SOLN: 0.5000 mg | RESPIRATORY_TRACT | Status: DC

## 2021-08-21 MED ORDER — METHYLPREDNISOLONE SODIUM SUCC 125 MG IJ SOLR
60.0000 mg | Freq: Two times a day (BID) | INTRAMUSCULAR | Status: DC
  Administered 2021-08-21 – 2021-08-22 (×3): 60 mg via INTRAVENOUS
  Filled 2021-08-21 (×3): qty 2

## 2021-08-21 MED ORDER — ALBUTEROL SULFATE (2.5 MG/3ML) 0.083% IN NEBU
INHALATION_SOLUTION | RESPIRATORY_TRACT | Status: AC
  Administered 2021-08-21: 10 mg via RESPIRATORY_TRACT
  Filled 2021-08-21: qty 12

## 2021-08-21 MED ORDER — ROCURONIUM BROMIDE 10 MG/ML (PF) SYRINGE
PREFILLED_SYRINGE | INTRAVENOUS | Status: AC
  Filled 2021-08-21: qty 10

## 2021-08-21 MED ORDER — IPRATROPIUM-ALBUTEROL 0.5-2.5 (3) MG/3ML IN SOLN
3.0000 mL | Freq: Four times a day (QID) | RESPIRATORY_TRACT | Status: DC
  Administered 2021-08-21 – 2021-08-24 (×15): 3 mL via RESPIRATORY_TRACT
  Filled 2021-08-21 (×15): qty 3

## 2021-08-21 MED ORDER — IOHEXOL 350 MG/ML SOLN
80.0000 mL | Freq: Once | INTRAVENOUS | Status: AC | PRN
  Administered 2021-08-21: 80 mL via INTRAVENOUS

## 2021-08-21 NOTE — Progress Notes (Signed)
Changes made per ABG results.  MD notified.

## 2021-08-21 NOTE — ED Notes (Signed)
Troponin 58, Dr. Carson Myrtle made aware.

## 2021-08-21 NOTE — Progress Notes (Signed)
Pharmacy Antibiotic Note  Isaac [name] is a 65 y.o. male admitted on 08/21/2021 with cellulitis.  Pharmacy has been consulted for vanc dosing.  Pt who presented respiratory arrest. He also has lower extremity wound infection. Vanc has been ordered. Ok to change zosyn to ceftriaxone per Dr. Carson Myrtle to avoid AKI risk with vanc.   Scr 0.96  Plan: Vanc 1.25g IV q12 (trough 10-15) Ceftriaxone 2g IV q24 Levels as needed   Height: 5\' 10"  (177.8 cm) Weight: 98 kg (216 lb 0.8 oz) IBW/kg (Calculated) : 73  No data recorded.  Recent Labs  Lab 08/21/21 0057  WBC 119.2*  CREATININE 0.96  LATICACIDVEN 1.4    Estimated Creatinine Clearance: 90.1 mL/min (by C-G formula based on SCr of 0.96 mg/dL).    Allergies  Allergen Reactions   Codeine Itching and Other (See Comments)    "jittery"   Prozac [Fluoxetine Hcl] Other (See Comments)    confusion    Antimicrobials this admission: 9/26 vanc>> 9/26 ceftriaxone>>  Dose adjustments this admission:   Microbiology results: 9/26 blood>>  Onnie Boer, PharmD, [place], AAHIVP, CPP Infectious Disease Pharmacist 08/21/2021 3:34 AM

## 2021-08-21 NOTE — Progress Notes (Signed)
PCCM interval update note  Brief HPI 65 yo male with remote tobacco use and COPD, admitted with acute encephalopathy due to hypercarbic respiratory failure requiring Bipap.  Patient examined on Bipap this afternoon.  Neurological exam has improved throughout the day and has remained off vasopressors.  He is alert and oriented, moving all extremities equally.  He states his dyspnea is improved.  -675 ml I/O with diuresis.   A/P: Acute hypercarbic respiratory failure:  Patient's neurological exam improved, oxygenating well. Wean from Bipap to [place].    Community acquired pneumonia. CTA chest with consolidation in the RML and lingula.  Small bilateral pleural effusions. Recommend speech eval to r/o silent aspiration.  Continue antibiotics: Rocephin. MRSA nares negative, d/c vancomycin.   Severe COPD, with AE: Gold stage IV lung disease. Continue scheduled pulmicort, atrovent, and duoneb. Solumedrol, plan to taper to prednisone tomorrow.   Hypertension/Chronic diastolic dysfunction: ECHO 12/08/2020: LVEF 60-65% with moderate left ventricular hypertrophy, RV normal size. Hold home clonidine patch and metoprolol with recent vasopressor requirements.  Continue lasix.   Constipation: Add Miralax.   LUE wound: Lesion removed in dermatology office last week, grey/black areas consistent with cauterization. Wound care evaluated, clean with saline and apply polysporin BID.   CLL: Seen by oncology 9/20: Remains on Ibrutinib. WBC elevated, baseline WBC 90-140 as outpatient  Hyperglycemia: Likely steroid induced. A1c 6 in April of this year. Continue SSI.   DVT prophylaxis: Heparin subq GI Prophylaxis: Protonix  Exam:  Gen: Adult male resting in bed on Bipap without distress Neuro: Alert and oriented, moving all extremities equally, normal sensation Lungs: Expiratory wheezing at bases, symmetric expansion with air movement Abdomen: Soft, non tender, non distended Ext: no edema, no rash  CCT: 35 min.    Paulita Fujita, ACNP

## 2021-08-21 NOTE — Consult Note (Signed)
[facility] Nurse Consult Note: Reason for Consult: LUE wound Patient had lesion removed in dermatology office last week. Staff concerned about appearance of wound bed  Wound type: surgical  Pressure Injury POA: NA Measurement 3cm x 3cm x 0.2cm  Wound bed: grey/black; consistent with use of cauterization during procedure last week  Drainage (amount, consistency, odor) minimal, no odor Periwound: intact  Dressing procedure/placement/frequency: Continue POC per dermatology office.  Clean wound with saline, apply Polysporin BID Cover with dry dressing.   Discussed POC with patient and bedside nurse.  Re consult if needed, will not follow at this time. Thanks  Falana Clagg [employer], RN,CWOCN, CNS, [place] [phone])

## 2021-08-21 NOTE — Progress Notes (Signed)
Inpatient Diabetes Program Recommendations  AACE/ADA: New Consensus Statement on Inpatient Glycemic Control (2015)  Target Ranges:  Prepandial:   less than 140 mg/dL      Peak postprandial:   less than 180 mg/dL (1-2 hours)      Critically ill patients:  140 - 180 mg/dL   Lab Results  Component Value Date   GLUCAP 179 (H) 08/21/2021   HGBA1C 6.0 (H) 03/08/2021    Review of Glycemic Control Results for CAP, MASSI" (MRN [id number]) as of 08/21/2021 13:54  Ref. Range 08/21/2021 00:57 08/21/2021 05:04  Glucose Latest Ref Range: 70 - 99 mg/dL 272 (H) 217 (H)  Results for CHIDERA, THIVIERGE" (MRN [id number]) as of 08/21/2021 13:54  Ref. Range 08/21/2021 07:07 08/21/2021 11:14  Glucose-Capillary Latest Ref Range: 70 - 99 mg/dL 202 (H) 179 (H)   Diabetes history: None Current orders for Inpatient glycemic control: None  Solumedrol 60 mg Q12 hours  Inpatient Diabetes Program Recommendations:    - Consider adding Novolog 0-15 units Q4 hours  Thanks,  Tama Headings RN, MSN, BC-ADM Inpatient Diabetes Coordinator Team Pager [phone] (8a-5p)

## 2021-08-21 NOTE — ED Notes (Signed)
WBC 128.3, Dr. Carson Myrtle made aware.

## 2021-08-21 NOTE — Progress Notes (Signed)
RT NOTE:  Pt transported to 3M12 on BIPAP without event.

## 2021-08-21 NOTE — ED Notes (Signed)
Report called and given to Earlie Server, Therapist, sports.

## 2021-08-21 NOTE — H&P (Signed)
.  [email or web address]

## 2021-08-21 NOTE — ED Provider Notes (Signed)
[facility] Emergency Department Provider Note MRN:  [id number]  Arrival date & time: 08/21/21     Chief Complaint   Respiratory Distress   History of Present Illness   Isaac [name] is a 65 y.o. year-old male with a history of COPD presenting to the ED with chief complaint of respiratory failure.  Patient coming from care facility with respiratory arrest, concern for COPD exacerbation.  On palliative care, DNR but wants to be intubated.  Patient is unresponsive, bag-valve-mask being applied.  I was unable to obtain an accurate HPI, PMH, or ROS due to the patient's respiratory failure.  Level 5 caveat.  Review of Systems  Positive for respiratory failure  Patient's Health History    Past Medical History:  Diagnosis Date   Acute on chronic respiratory failure with hypoxia (HCC)    Anxiety    Chronic lymphocytic leukemia in remission (HCC)    COPD (chronic obstructive pulmonary disease) (HCC)    COPD, severe (HCC)    GERD (gastroesophageal reflux disease)    Hematuria    Hyperlipidemia    Hypertension    Insomnia    Left lower lobe pneumonia    Nephrolithiasis    Tobacco dependence     Past Surgical History:  Procedure Laterality Date   LITHOTRIPSY      Family History  Problem Relation Age of Onset   Heart disease Father    Stroke Mother    Leukemia Brother     Social History   Socioeconomic History   Marital status: Married    Spouse name: Not on file   Number of children: Not on file   Years of education: Not on file   Highest education level: Not on file  Occupational History   Occupation: unemployed  Tobacco Use   Smoking status: Former    Packs/day: 1.00    Years: 35.00    Pack years: 35.00    Types: Cigarettes    Quit date: 12/07/2008    Years since quitting: 12.7   Smokeless tobacco: Never  Vaping Use   Vaping Use: Never used  Substance and Sexual Activity   Alcohol use: Not on file   Drug use: Not on file    Sexual activity: Yes  Other Topics Concern   Not on file  Social History Narrative   Not on file   Social Determinants of Health   Financial Resource Strain: Not on file  Food Insecurity: Not on file  Transportation Needs: Not on file  Physical Activity: Not on file  Stress: Not on file  Social Connections: Not on file  Intimate Partner Violence: Not on file     Physical Exam   Vitals:   08/21/21 0200 08/21/21 0215  BP: 123/60 109/71  Pulse: 62 69  Resp: (!) 22 17  SpO2: 98% 98%    CONSTITUTIONAL:  ill-appearing NEURO: Minimal response to pain EYES:  eyes equal and reactive ENT/NECK:  no LAD, no JVD CARDIO: Regular rate, well-perfused, normal S1 and S2 PULM: Poor air movement GI/GU:  normal bowel sounds, non-distended, non-tender MSK/SPINE:  No gross deformities, no edema SKIN:  no rash, atraumatic PSYCH: Unable to assess  *Additional and/or pertinent findings included in MDM below  Diagnostic and Interventional Summary    EKG Interpretation  Date/Time:  Monday August 21 2021 00:47:12 EDT Ventricular Rate:  91 PR Interval:  208 QRS Duration: 160 QT Interval:  389 QTC Calculation: 479 R Axis:   231 Text Interpretation: Sinus rhythm  Borderline prolonged PR interval Right bundle branch block Artifact in lead(s) I V2 Confirmed by Gerlene Fee [phone]) on 08/21/2021 12:56:24 AM       Labs Reviewed  CBC - Abnormal; Notable for the following components:      Result Value   WBC 119.2 (*)    Hemoglobin 12.6 (*)    MCH 25.5 (*)    MCHC 28.3 (*)    All other components within normal limits  COMPREHENSIVE METABOLIC PANEL - Abnormal; Notable for the following components:   CO2 33 (*)    Glucose, Bld 272 (*)    Calcium 8.6 (*)    Total Protein 5.8 (*)    Albumin 3.1 (*)    All other components within normal limits  I-STAT VENOUS BLOOD GAS, ED - Abnormal; Notable for the following components:   pCO2, Ven 80.8 (*)    pO2, Ven 125.0 (*)    Bicarbonate 35.7  (*)    TCO2 38 (*)    Acid-Base Excess 6.0 (*)    Calcium, Ion 1.12 (*)    All other components within normal limits  TROPONIN I (HIGH SENSITIVITY) - Abnormal; Notable for the following components:   Troponin I (High Sensitivity) 19 (*)    All other components within normal limits  LACTIC ACID, PLASMA  BRAIN NATRIURETIC PEPTIDE  TROPONIN I (HIGH SENSITIVITY)    DG Chest Port 1 View  Final Result    CT Angio Chest PE W and/or Wo Contrast    (Results Pending)    Medications  norepinephrine (LEVOPHED) 4mg  in 260mL premix infusion (12 mcg/min Intravenous Rate/Dose Change 08/21/21 0137)  albuterol (PROVENTIL) (2.5 MG/3ML) 0.083% nebulizer solution 10 mg (10 mg Nebulization Given 08/21/21 0106)  ipratropium (ATROVENT) nebulizer solution 0.5 mg (0.5 mg Nebulization Given 08/21/21 0118)  EPINEPHrine (EPI-PEN) injection 0.3 mg (0.3 mg Intramuscular Given 08/21/21 0120)     Procedures  /  Critical Care .Critical Care Performed by: Maudie Flakes, MD Authorized by: Maudie Flakes, MD   Critical care provider statement:    Critical care time (minutes):  45   Critical care was necessary to treat or prevent imminent or life-threatening deterioration of the following conditions:  Respiratory failure   Critical care was time spent personally by me on the following activities:  Discussions with consultants, evaluation of patient's response to treatment, examination of patient, ordering and performing treatments and interventions, ordering and review of laboratory studies, ordering and review of radiographic studies, pulse oximetry, re-evaluation of patient's condition, obtaining history from patient or surrogate and review of old charts  ED Course and Medical Decision Making  I have reviewed the triage vital signs, the nursing notes, and pertinent available records from the EMR.  Listed above are laboratory and imaging tests that I personally ordered, reviewed, and interpreted and then considered  in my medical decision making (see below for details).  Respiratory failure, favoring COPD exacerbation.  Given the hypercapnia and poor mental status, decision was made to intubate.  However, likely due to very good bag-valve-mask use by EMS, patient recovered his mental status during procedural timeout.  Now able to converse, still with poor air movement, now is a good candidate for BiPAP, continuous nebs.  Received Solu-Medrol, EpiPen, magnesium in route.     Patient seems to be tolerating BiPAP well in terms of mental status and pulmonary status, however patient now with profound hypotension, 60s over 40s, very weak radial pulse.  Provided with an additional EpiPen administration to provide  some immediate blood pressure support while set up for norepinephrine drip was in process.  Blood pressure improved on nor epi drip.  This hypotension could be due to the positive pressure, but it does raise concern for other processes, such as septic shock, cardiogenic shock, obstructive shock.  CTA chest ordered, admitted to ICU for further care.  Barth Kirks. Sedonia Small, MD [place] [email or web address]  Final Clinical Impressions(s) / ED Diagnoses     ICD-10-CM   1. Acute respiratory failure with hypoxia and hypercapnia (HCC)  J96.01    J96.02       ED Discharge Orders     None        Discharge Instructions Discussed with and Provided to Patient:   Discharge Instructions   None       Maudie Flakes, MD 08/21/21 [phone]

## 2021-08-21 NOTE — Progress Notes (Signed)
Date and time results received: 08/21/21 10:07 AM  (use smartphrase ".now" to insert current time)  Test: Lactic acid Critical Value: 2.9  Name of Provider Notified: Dr. Shearon Stalls  Orders Received? Or Actions Taken?: see orders

## 2021-08-21 NOTE — ED Notes (Signed)
Pt's wife left her contact info- [phone] and [phone]

## 2021-08-21 NOTE — ED Triage Notes (Signed)
Pt brought in by EMS for respiratory distress from home. Pt is being bagged and is responsive to pain.

## 2021-08-21 NOTE — H&P (Addendum)
**Note Isaac-Identified via Obfuscation** NAME:  DEMORRIS [name] MRN:  [id number] DOB:  1956-05-25 LOS: 0 ADMISSION DATE:  08/21/2021 DATE OF SERVICE:  08/21/2021  CHIEF COMPLAINT:  respiratory arrest in the field   HISTORY & PHYSICAL  History of Present Illness  This 65 y.o. Caucasian male reformed smoker presented to the [facility] Emergency Department via EMS with complaints of respiratory arrest in the field (at home).  There are some discrepancies in charting in the EMR.  The HPI documented here is obtained from interview with the patient and patient's wife (at bedside).  The patinet was reportedly at his baseline status earlier in the eveing, when he and his wife watched football on TV together.  He went to sleep but awaked with an acute sense of dyspnea.  She notes that this has been happening more frequently lately, and he has recently had an increase in metoprolol dosage for suboptimal blood pressure control which was believed to be responsible for these symptoms.  He also endorses orthopnea.  He has a hospital bed at home and uses it to raise his head.  He was reportedly found to be in respiratory arrest.  He was supported with bag-mask ventilation to the ER, by which time he apparently had regained consciousness.  He was also noted to be hypertensive on arrival.  He was placed on BiPAP support.  While his mentation improved and respiratory status apppeared stable, he became hypotensive, and the ER started norepinephrine infusion.  At the time of clinical interview, the patient is noted to have MAP 87 on 12 mcg/min.  Of note, this patient is under palliative care.  He has end-stage COPD and chronic lymphocytic leukemia.  His CODE STATUS is PARTIAL CODE (agreeable to intubation if needed).  He is very clear that he does not want chest compressions, shocks or medications for a malignant arrhythmia.  In fact, he even states that he prefers to manage his respiratory status with BiPAP over intubation, if at all  possible.  REVIEW OF SYSTEMS Constitutional: No weight loss. No night sweats. No fever. No chills. No fatigue. HEENT: No headaches, dysphagia, sore throat, otalgia, nasal congestion, PND CV:  Orthopnea, paroxysmal nocturnal dyspnea. No chest pain, swelling in lower extremities, palpitations GI:  No abdominal pain, nausea, vomiting, diarrhea, change in bowel pattern, anorexia Resp: No DOE, rest dyspnea, cough, mucus, hemoptysis, wheezing  GU: no dysuria, change in color of urine, no urgency or frequency.  No flank pain. MS:  No joint pain or swelling. No myalgias,  No decreased range of motion.  Psych:  No change in mood or affect. No memory loss. Skin: no rash or lesions.   Past Medical/Surgical/Social/Family History   Past Medical History:  Diagnosis Date   Acute on chronic respiratory failure with hypoxia (HCC)    Anxiety    Chronic lymphocytic leukemia in remission (HCC)    COPD (chronic obstructive pulmonary disease) (HCC)    COPD, severe (HCC)    GERD (gastroesophageal reflux disease)    Hematuria    Hyperlipidemia    Hypertension    Insomnia    Left lower lobe pneumonia    Nephrolithiasis    Tobacco dependence     Past Surgical History:  Procedure Laterality Date   LITHOTRIPSY      Social History   Tobacco Use   Smoking status: Former    Packs/day: 1.00    Years: 35.00    Pack years: 35.00    Types: Cigarettes  Quit date: 12/07/2008    Years since quitting: 12.7   Smokeless tobacco: Never  Substance Use Topics   Alcohol use: Not on file    Family History  Problem Relation Age of Onset   Heart disease Father    Stroke Mother    Leukemia Brother      Procedures:     Significant Diagnostic Tests:     Micro Data:   Results for orders placed or performed during the hospital encounter of 03/04/21  Blood culture (routine x 2)     Status: None   Collection Time: 03/04/21  8:03 PM   Specimen: BLOOD RIGHT FOREARM  Result Value Ref Range Status    Specimen Description BLOOD RIGHT FOREARM  Final   Special Requests   Final    BOTTLES DRAWN AEROBIC AND ANAEROBIC Blood Culture adequate volume   Culture   Final    NO GROWTH 5 DAYS Performed at [facility], [address] [place], [place] [address]    Report Status 03/09/2021 FINAL  Final  Blood culture (routine x 2)     Status: None   Collection Time: 03/04/21  8:03 PM   Specimen: BLOOD RIGHT HAND  Result Value Ref Range Status   Specimen Description BLOOD RIGHT HAND  Final   Special Requests   Final    BOTTLES DRAWN AEROBIC AND ANAEROBIC Blood Culture adequate volume   Culture   Final    NO GROWTH 5 DAYS Performed at [facility], [place] [address] [place], [place] [address]    Report Status 03/09/2021 FINAL  Final  SARS CORONAVIRUS 2 (TAT 6-24 HRS) Nasopharyngeal Nasopharyngeal Swab     Status: None   Collection Time: 03/04/21  8:12 PM   Specimen: Nasopharyngeal Swab  Result Value Ref Range Status   SARS Coronavirus 2 NEGATIVE NEGATIVE Final    Comment: (NOTE) SARS-CoV-2 target nucleic acids are NOT DETECTED.  The SARS-CoV-2 RNA is generally detectable in upper and lower respiratory specimens during the acute phase of infection. Negative results do not preclude SARS-CoV-2 infection, do not rule out co-infections with other pathogens, and should not be used as the sole basis for treatment or other patient management decisions. Negative results must be combined with clinical observations, patient history, and epidemiological information. The expected result is Negative.  Fact Sheet for Patients: SugarRoll.be  Fact Sheet for Healthcare Providers: https://www.woods-mathews.com/  This test is not yet approved or cleared by the [place] FDA and  has been authorized for detection and/or diagnosis of SARS-CoV-2 by FDA under an Emergency Use Authorization (EUA). This EUA will remain  in effect (meaning this test can  be used) for the duration of the COVID-19 declaration under Se ction 564(b)(1) of the Act, 21 U.S.C. section 360bbb-3(b)(1), unless the authorization is terminated or revoked sooner.  Performed at [facility], [place] [address] [place], [place] [address]   MRSA PCR Screening     Status: None   Collection Time: 03/05/21  3:36 PM   Specimen: Nasal Mucosa; Nasopharyngeal  Result Value Ref Range Status   MRSA by PCR NEGATIVE NEGATIVE Final    Comment:        The GeneXpert MRSA Assay (FDA approved for NASAL specimens only), is one component of a comprehensive MRSA colonization surveillance program. It is not intended to diagnose MRSA infection nor to guide or monitor treatment for MRSA infections. Performed at [facility], [place] [address], [place], [place] [address]  Antimicrobials:      Interim history/subjective:     Objective   BP 109/71   Pulse 69   Resp 17   Ht 5\' 10"  (1.778 m)   Wt 98 kg   SpO2 98%   BMI 31.00 kg/m     Filed Weights   08/21/21 0050  Weight: 98 kg   No intake or output data in the 24 hours ending 08/21/21 0242  Vent Mode: BIPAP FiO2 (%):  [40 %] 40 % Set Rate:  [15 bmp] 15 bmp PEEP:  [5 cmH20] 5 cmH20   Examination: GENERAL:  alert, oriented to time, person and place, pleasant, well-developed. Acute  respiratory distress, tolerating BiPAP. HEAD: normocephalic, atraumatic EYE: PERRLA, EOM intact, no scleral icterus, no pallor. NOSE: nares are patent. No polyps. No exudate.  THROAT/ORAL CAVITY: Normal dentition. No oral thrush. No exudate. Mucous membranes are moist. No tonsillar enlargement.  NECK: supple, no thyromegaly, no JVD, no lymphadenopathy. Trachea midline. CHEST/LUNG: symmetric in development and expansion. Diminished air entry, nearly silent chest. Bilateral lower lung predominant crackles. Scattered wheezes. HEART: Regular S1 and S2 without murmur, rub or gallop. ABDOMEN: soft, nontender, nondistended.  Normoactive bowel sounds. No rebound. No guarding. No hepatosplenomegaly. EXTREMITIES: Edema: Trace. No cyanosis. No clubbing. 2+ DP pulses LYMPHATIC: no cervical/axillary/inguinal lymph nodes appreciated MUSCULOSKELETAL: No point tenderness. Bulk atrophy. Joints: normal inspection.  SKIN:  Left upper arm open wound s/p excision.  No purulent drainage but with necrotic appearaing granulation base. NEUROLOGIC: Doll's eyes intact. Corneal reflex intact. Spontaneous respirations intact. Cranial nerves II-XII are grossly symmetric and physiologic. Babinski absent. No sensory deficit. Motor: 5/5 @ RUE, 5/5 @ LUE, 5/5 @ RLL,  5/5 @ LLL.  DTR: 2+ @ R biceps, 2+ @ L biceps, 2+ @ R patellar,  2+ @ L patellar. No cerebellar signs. Gait was not assessed.   Resolved Hospital Problem list      Assessment & Plan:   ASSESSMENT/PLAN:  ASSESSMENT (included in the Hospital Problem List)  Principal Problem:   Acute on chronic respiratory failure with hypoxia and hypercapnia (HCC) Active Problems:   COPD exacerbation (HCC)   Acute-on-chronic respiratory failure (HCC)   Respiratory arrest (HCC)   Wound infection after surgery   Hypotension   Volume overload   Obesity   Chronic diastolic heart failure (HCC)   CLL (chronic lymphocytic leukemia) (HCC)   By systems: PULMONARY Respiratory arrest at home Acute on chronic hypoxemic respiratory failure End-stage COPD, on 2 LPM at home, with acute exacerbation Diurese Continue BiPAP Aerosols SoluMedrol   CARDIOVASCULAR Hypotension with normal lactate Volume overload Chronic diastolic dysfunction Diurese Remove Catapres patch May benefit from adding daily diuretic to his regimen rather than increasing metoprolol for his blood pressure control Wean norepinephrine off as tolerated Trend lactate  RENAL: No acute issues Place Foley catheter Strict I/O   GASTROINTESTINAL: No acute issues GI PROPHYLAXIS: Protonix   HEMATOLOGIC Chronic  lymphocytic leukemia On Imbruvica DVT PROPHYLAXIS: heparin   INFECTIOUS Possible wound infection Empiric Zosyn/vancomycin Wound care consult   ENDOCRINE: No acute issues Check hemoglobin A1c   NEUROLOGIC: No acute issues   PLAN/RECOMMENDATIONS  Admit to ICU under my service (Attending: Renee Pain, MD) with the diagnoses highlighted above in the active Hospital Problem List (ASSESSMENT). NUTRITION: NPO while BiPAP dependent    My assessment, plan of care, findings, medications, side effects, etc. were discussed with: nurse, patient (answered all questions to patient's satisfaction), and patient's next of kin (answered all questions to his/her satisfaction).  Best practice:  Diet: NPO while BiPAP dependent Pain/Anxiety/Delirium protocol (if indicated): N/A VAP protocol (if indicated): YES DVT prophylaxis: heparin GI prophylaxis: Protonix Glucose control: check HgbA1c Mobility/Activity: bedrest   Code Status: Partial Code Family Communication:  patient's family (wife updated at bedside) Disposition: admit to ICU   Labs   CBC: Recent Labs  Lab 08/21/21 0057 08/21/21 0110  WBC 119.2*  --   HGB 12.6* 13.9  HCT 44.5 41.0  MCV 90.1  --   PLT 261  --     Basic Metabolic Panel: Recent Labs  Lab 08/21/21 0057 08/21/21 0110  NA 140 139  K 4.9 4.7  CL 100  --   CO2 33*  --   GLUCOSE 272*  --   BUN 13  --   CREATININE 0.96  --   CALCIUM 8.6*  --    GFR: Estimated Creatinine Clearance: 90.1 mL/min (by C-G formula based on SCr of 0.96 mg/dL). Recent Labs  Lab 08/21/21 0057  WBC 119.2*  LATICACIDVEN 1.4    Liver Function Tests: Recent Labs  Lab 08/21/21 0057  AST 16  ALT 8  ALKPHOS 92  BILITOT 0.6  PROT 5.8*  ALBUMIN 3.1*   No results for input(s): LIPASE, AMYLASE in the last 168 hours. No results for input(s): AMMONIA in the last 168 hours.  ABG    Component Value Date/Time   PHART 7.425 03/07/2021 0421   PCO2ART 49.9 (H) 03/07/2021 0421    PO2ART 76.9 (L) 03/07/2021 0421   HCO3 35.7 (H) 08/21/2021 0110   TCO2 38 (H) 08/21/2021 0110   ACIDBASEDEF 28.0 (H) 03/06/2021 0810   O2SAT 98.0 08/21/2021 0110     Coagulation Profile: No results for input(s): INR, PROTIME in the last 168 hours.  Cardiac Enzymes: No results for input(s): CKTOTAL, CKMB, CKMBINDEX, TROPONINI in the last 168 hours.  HbA1C: Hgb A1c MFr Bld  Date/Time Value Ref Range Status  03/08/2021 04:40 AM 6.0 (H) 4.8 - 5.6 % Final    Comment:    (NOTE) Pre diabetes:          5.7%-6.4%  Diabetes:              >6.4%  Glycemic control for   <7.0% adults with diabetes   12/08/2020 05:01 AM 6.7 (H) 4.8 - 5.6 % Final    Comment:    (NOTE) Pre diabetes:          5.7%-6.4%  Diabetes:              >6.4%  Glycemic control for   <7.0% adults with diabetes     CBG: No results for input(s): GLUCAP in the last 168 hours.   Past Medical History   Past Medical History:  Diagnosis Date   Acute on chronic respiratory failure with hypoxia (HCC)    Anxiety    Chronic lymphocytic leukemia in remission (HCC)    COPD (chronic obstructive pulmonary disease) (HCC)    COPD, severe (HCC)    GERD (gastroesophageal reflux disease)    Hematuria    Hyperlipidemia    Hypertension    Insomnia    Left lower lobe pneumonia    Nephrolithiasis    Tobacco dependence       Surgical History    Past Surgical History:  Procedure Laterality Date   LITHOTRIPSY        Social History   Social History   Socioeconomic History   Marital status: Married    Spouse name: Not on  file   Number of children: Not on file   Years of education: Not on file   Highest education level: Not on file  Occupational History   Occupation: unemployed  Tobacco Use   Smoking status: Former    Packs/day: 1.00    Years: 35.00    Pack years: 35.00    Types: Cigarettes    Quit date: 12/07/2008    Years since quitting: 12.7   Smokeless tobacco: Never  Vaping Use   Vaping Use:  Never used  Substance and Sexual Activity   Alcohol use: Not on file   Drug use: Not on file   Sexual activity: Yes  Other Topics Concern   Not on file  Social History Narrative   Not on file   Social Determinants of Health   Financial Resource Strain: Not on file  Food Insecurity: Not on file  Transportation Needs: Not on file  Physical Activity: Not on file  Stress: Not on file  Social Connections: Not on file      Family History    Family History  Problem Relation Age of Onset   Heart disease Father    Stroke Mother    Leukemia Brother    family history includes Heart disease in his father; Leukemia in his brother; Stroke in his mother.    Allergies Allergies  Allergen Reactions   Codeine Itching and Other (See Comments)    "jittery"   Prozac [Fluoxetine Hcl] Other (See Comments)    confusion      Current Medications  Current Facility-Administered Medications:    norepinephrine (LEVOPHED) 4mg  in 246mL premix infusion, 0-40 mcg/min, Intravenous, Continuous, Bero, Barth Kirks, MD, Last Rate: 45 mL/hr at 08/21/21 0137, 12 mcg/min at 08/21/21 0137  Current Outpatient Medications:    albuterol (PROVENTIL) (2.5 MG/3ML) 0.083% nebulizer solution, Take 3 mLs (2.5 mg total) by nebulization every 4 (four) hours as needed for wheezing or shortness of breath., Disp: 75 mL, Rfl: 12   albuterol (VENTOLIN HFA) 108 (90 Base) MCG/ACT inhaler, INHALE 2 PUFFS INTO THE LUNGS EVERY 4 (FOUR) HOURS AS NEEDED FOR WHEEZING OR SHORTNESS OF BREATH., Disp: 8.5 each, Rfl: 5   allopurinol (ZYLOPRIM) 100 MG tablet, Place 1 tablet (100 mg total) into feeding tube daily., Disp: 90 tablet, Rfl: 3   budesonide (PULMICORT) 0.5 MG/2ML nebulizer solution, TAKE 2 ML BY NEBULIZATION 2 TIMES DAILY., Disp: 360 mL, Rfl: 5   cloNIDine (CATAPRES - DOSED IN MG/24 HR) 0.1 mg/24hr patch, Place 0.1 mg onto the skin once a week. Tuesdays, Disp: , Rfl:    docusate sodium (COLACE) 100 MG capsule, Take 100 mg by  mouth at bedtime., Disp: , Rfl:    Ensure (ENSURE), Take 237 mLs by mouth daily. Chocolate flavor, Disp: , Rfl:    furosemide (LASIX) 40 MG tablet, Take 40 mg by mouth daily., Disp: , Rfl:    guaiFENesin (MUCINEX) 600 MG 12 hr tablet, Take 600 mg by mouth daily., Disp: , Rfl:    ibrutinib (IMBRUVICA) 420 MG TABS, Take 1 tablet (420 mg) by mouth daily., Disp: 30 tablet, Rfl: 3   ipratropium-albuterol (DUONEB) 0.5-2.5 (3) MG/3ML SOLN, TAKE 3 MLS (ONE VIAL) BY NEBULIZATION EVERY 6 (SIX) HOURS AS NEEDED., Disp: 360 mL, Rfl: 3   metoprolol tartrate (LOPRESSOR) 25 MG tablet, Take 25 mg by mouth 2 (two) times daily., Disp: , Rfl:    montelukast (SINGULAIR) 10 MG tablet, Take 10 mg by mouth every evening., Disp: , Rfl:    pantoprazole (  PROTONIX) 40 MG tablet, Take 40 mg by mouth at bedtime., Disp: , Rfl:    polyethylene glycol (MIRALAX / GLYCOLAX) 17 g packet, Place 17 g into feeding tube daily as needed for moderate constipation. (Patient taking differently: Take 17 g by mouth as needed for moderate constipation.), Disp: 14 each, Rfl: 0   sodium chloride (OCEAN) 0.65 % SOLN nasal spray, Place 2 sprays into both nostrils as needed for congestion., Disp: , Rfl: 0   Home Medications  Prior to Admission medications   Medication Sig Start Date End Date Taking? Authorizing Provider  albuterol (PROVENTIL) (2.5 MG/3ML) 0.083% nebulizer solution Take 3 mLs (2.5 mg total) by nebulization every 4 (four) hours as needed for wheezing or shortness of breath. 12/19/20   Noe Gens L, NP  albuterol (VENTOLIN HFA) 108 (90 Base) MCG/ACT inhaler INHALE 2 PUFFS INTO THE LUNGS EVERY 4 (FOUR) HOURS AS NEEDED FOR WHEEZING OR SHORTNESS OF BREATH. 05/31/21   Brand Males, MD  allopurinol (ZYLOPRIM) 100 MG tablet Place 1 tablet (100 mg total) into feeding tube daily. 04/07/21   Wyatt Portela, MD  budesonide (PULMICORT) 0.5 MG/2ML nebulizer solution TAKE 2 ML BY NEBULIZATION 2 TIMES DAILY. 07/25/21   Brand Males, MD   cloNIDine (CATAPRES - DOSED IN MG/24 HR) 0.1 mg/24hr patch Place 0.1 mg onto the skin once a week. Tuesdays 03/02/21   [provider]  docusate sodium (COLACE) 100 MG capsule Take 100 mg by mouth at bedtime.    [provider]  Ensure (ENSURE) Take 237 mLs by mouth daily. Chocolate flavor    [provider]  furosemide (LASIX) 40 MG tablet Take 40 mg by mouth daily. 03/02/21   [provider]  guaiFENesin (MUCINEX) 600 MG 12 hr tablet Take 600 mg by mouth daily. 06/23/12   [provider]  ibrutinib (IMBRUVICA) 420 MG TABS Take 1 tablet (420 mg) by mouth daily. 07/25/21   Wyatt Portela, MD  ipratropium-albuterol (DUONEB) 0.5-2.5 (3) MG/3ML SOLN TAKE 3 MLS (ONE VIAL) BY NEBULIZATION EVERY 6 (SIX) HOURS AS NEEDED. 06/14/21   Brand Males, MD  metoprolol tartrate (LOPRESSOR) 25 MG tablet Take 25 mg by mouth 2 (two) times daily. 01/26/21   [provider]  montelukast (SINGULAIR) 10 MG tablet Take 10 mg by mouth every evening.    [provider]  pantoprazole (PROTONIX) 40 MG tablet Take 40 mg by mouth at bedtime. 03/02/21   [provider]  polyethylene glycol (MIRALAX / GLYCOLAX) 17 g packet Place 17 g into feeding tube daily as needed for moderate constipation. Patient taking differently: Take 17 g by mouth as needed for moderate constipation. 12/19/20   Donita Brooks, NP  sodium chloride (OCEAN) 0.65 % SOLN nasal spray Place 2 sprays into both nostrils as needed for congestion. 05/05/14   Erick Colace, NP      Critical care time: 45 minutes.  The treatment and management of the patient's condition was required based on the threat of imminent deterioration. This time reflects time spent by the physician evaluating, providing care and managing the critically ill patient's care. The time was spent at the immediate bedside (or on the same floor/unit and dedicated to this patient's care). Time involved in separately billable  procedures is NOT included int he critical care time indicated above. Family meeting and update time may be included above if and only if the patient is unable/incompetent to participate in clinical interview and/or decision making, and the discussion was necessary  to determining treatment decisions.   Renee Pain, MD Board Certified by the ABIM, [place]

## 2021-08-21 NOTE — Progress Notes (Signed)
Pt placed on Bipap per MD order ?

## 2021-08-21 NOTE — Progress Notes (Signed)
Pt transported from ED 16 to CT and back with no complications

## 2021-08-22 ENCOUNTER — Inpatient Hospital Stay (HOSPITAL_COMMUNITY): Payer: BC Managed Care – PPO

## 2021-08-22 DIAGNOSIS — J441 Chronic obstructive pulmonary disease with (acute) exacerbation: Secondary | ICD-10-CM | POA: Diagnosis not present

## 2021-08-22 DIAGNOSIS — J9621 Acute and chronic respiratory failure with hypoxia: Secondary | ICD-10-CM | POA: Diagnosis not present

## 2021-08-22 DIAGNOSIS — I5032 Chronic diastolic (congestive) heart failure: Secondary | ICD-10-CM | POA: Diagnosis not present

## 2021-08-22 DIAGNOSIS — C9110 Chronic lymphocytic leukemia of B-cell type not having achieved remission: Secondary | ICD-10-CM | POA: Diagnosis not present

## 2021-08-22 DIAGNOSIS — J9622 Acute and chronic respiratory failure with hypercapnia: Secondary | ICD-10-CM | POA: Diagnosis not present

## 2021-08-22 LAB — BLOOD CULTURE ID PANEL (REFLEXED) - BCID2

## 2021-08-22 LAB — GLUCOSE, CAPILLARY
Glucose-Capillary: 152 mg/dL — ABNORMAL HIGH (ref 70–99)
Glucose-Capillary: 164 mg/dL — ABNORMAL HIGH (ref 70–99)
Glucose-Capillary: 158 mg/dL — ABNORMAL HIGH (ref 70–99)
Glucose-Capillary: 168 mg/dL — ABNORMAL HIGH (ref 70–99)
Glucose-Capillary: 230 mg/dL — ABNORMAL HIGH (ref 70–99)

## 2021-08-22 LAB — CBC WITH DIFFERENTIAL/PLATELET
Abs Immature Granulocytes: 0 10*3/uL (ref 0.00–0.07)
Basophils Absolute: 0 10*3/uL (ref 0.0–0.1)
Basophils Relative: 0 %
Eosinophils Absolute: 0 10*3/uL (ref 0.0–0.5)
Eosinophils Relative: 0 %
HCT: 36.4 % — ABNORMAL LOW (ref 39.0–52.0)
Hemoglobin: 10.7 g/dL — ABNORMAL LOW (ref 13.0–17.0)
Lymphocytes Relative: 73 %
Lymphs Abs: 60.2 10*3/uL — ABNORMAL HIGH (ref 0.7–4.0)
MCH: 25.5 pg — ABNORMAL LOW (ref 26.0–34.0)
MCHC: 29.4 g/dL — ABNORMAL LOW (ref 30.0–36.0)
MCV: 86.9 fL (ref 80.0–100.0)
Monocytes Absolute: 1.6 10*3/uL — ABNORMAL HIGH (ref 0.1–1.0)
Monocytes Relative: 2 %
Neutro Abs: 20.6 10*3/uL — ABNORMAL HIGH (ref 1.7–7.7)
Neutrophils Relative %: 25 %
Platelets: 210 10*3/uL (ref 150–400)
RBC: 4.19 MIL/uL — ABNORMAL LOW (ref 4.22–5.81)
RDW: 14.2 % (ref 11.5–15.5)
WBC: 82.4 10*3/uL (ref 4.0–10.5)
nRBC: 0 % (ref 0.0–0.2)

## 2021-08-22 MED ORDER — PREDNISONE 20 MG PO TABS: 30.0000 mg | ORAL_TABLET | Freq: Every day | ORAL | Status: DC

## 2021-08-22 MED ORDER — ORAL CARE MOUTH RINSE
15.0000 mL | Freq: Two times a day (BID) | OROMUCOSAL | Status: DC
  Administered 2021-08-22 – 2021-08-24 (×3): 15 mL via OROMUCOSAL

## 2021-08-22 MED ORDER — LORAZEPAM 0.5 MG PO TABS
0.5000 mg | ORAL_TABLET | Freq: Once | ORAL | Status: AC
  Administered 2021-08-22: 0.5 mg via ORAL
  Filled 2021-08-22: qty 1

## 2021-08-22 MED ORDER — CHLORHEXIDINE GLUCONATE 0.12 % MT SOLN
15.0000 mL | Freq: Two times a day (BID) | OROMUCOSAL | Status: DC
  Administered 2021-08-22 – 2021-08-24 (×4): 15 mL via OROMUCOSAL
  Filled 2021-08-22 (×3): qty 15

## 2021-08-22 MED ORDER — PREDNISONE 20 MG PO TABS
40.0000 mg | ORAL_TABLET | Freq: Every day | ORAL | Status: DC
  Administered 2021-08-23 – 2021-08-24 (×2): 40 mg via ORAL
  Filled 2021-08-22 (×2): qty 2

## 2021-08-22 MED ORDER — MELATONIN 3 MG PO TABS
3.0000 mg | ORAL_TABLET | Freq: Every evening | ORAL | Status: DC | PRN
  Administered 2021-08-22 – 2021-08-23 (×2): 3 mg via ORAL
  Filled 2021-08-22 (×2): qty 1

## 2021-08-22 MED ORDER — ORAL CARE MOUTH RINSE
15.0000 mL | Freq: Two times a day (BID) | OROMUCOSAL | Status: DC
  Administered 2021-08-23 – 2021-08-24 (×2): 15 mL via OROMUCOSAL

## 2021-08-22 MED ORDER — "THROMBI-PAD 3""X3"" EX PADS"
1.0000 | MEDICATED_PAD | Freq: Once | CUTANEOUS | Status: AC
  Administered 2021-08-22: 1 via TOPICAL
  Filled 2021-08-22: qty 1

## 2021-08-22 MED ORDER — METHYLPREDNISOLONE SODIUM SUCC 125 MG IJ SOLR
60.0000 mg | Freq: Once | INTRAMUSCULAR | Status: AC
  Administered 2021-08-22: 60 mg via INTRAVENOUS
  Filled 2021-08-22: qty 2

## 2021-08-22 MED ORDER — PREDNISONE 10 MG PO TABS: 10.0000 mg | ORAL_TABLET | Freq: Every day | ORAL | Status: DC

## 2021-08-22 MED ORDER — PANTOPRAZOLE SODIUM 40 MG PO TBEC
40.0000 mg | DELAYED_RELEASE_TABLET | Freq: Every day | ORAL | Status: DC
  Administered 2021-08-23 – 2021-08-24 (×2): 40 mg via ORAL
  Filled 2021-08-22 (×2): qty 1

## 2021-08-22 MED ORDER — PREDNISONE 20 MG PO TABS: 20.0000 mg | ORAL_TABLET | Freq: Every day | ORAL | Status: DC

## 2021-08-22 NOTE — Progress Notes (Signed)
PT Cancellation Note  Patient Details Name: ROOSVELT CHURCHWELL MRN: [id number] DOB: 01-28-1956   Cancelled Treatment:    Reason Eval/Treat Not Completed: Patient declined, no reason specified (pt anxious and reports he can't tolerate mobility today "too much going" pt reassured and educated yet pt continued to refuse)   Charistopher Rumble B Lareta Bruneau 08/22/2021, 1:27 PM [name], [place] Pager: [phone] Office: [phone]

## 2021-08-22 NOTE — Progress Notes (Signed)
Patient with foam dressing on left upper arm site of removal of mass 9/23 by dermatologist outpatient. Dressing was changed at 1600 today and now has some bleeding coming out from side of pad. Pad removed and oozing noted. Large clot still attached to distal portion of wound. Pad saturated with blood. Site redressed. Will contact MD for possible thrombipad if continues

## 2021-08-22 NOTE — Progress Notes (Addendum)
E-link contacted at this time in request of thrombi pad for patients left upper arm where mass had been previously removed. Site keeps bleeding soaking through foam pads. Will await orders.

## 2021-08-22 NOTE — Progress Notes (Addendum)
[facility] Progress Note Patient Name: Isaac [name] DOB: [birth year] MRN: [id number]   Date of Service  08/22/2021  HPI/Events of Note  Notified that superficial surgical site where mass was removed by derm today continues to bleed.   eICU Interventions  Ok to apply thrombi pad.      Intervention Category Minor Interventions: Other:  Elsie Lincoln 08/22/2021, 7:45 PM  9:46 PM Received request for melatonin.   Plan> Melatonin ordered prn.

## 2021-08-22 NOTE — Evaluation (Signed)
Clinical/Bedside Swallow Evaluation Patient Details  Name: KHAZA BLANSETT MRN: [id number] Date of Birth: 09/06/1956  Today's Date: 08/22/2021 Time: SLP Start Time (ACUTE ONLY): 0950 SLP Stop Time (ACUTE ONLY): 1000 SLP Time Calculation (min) (ACUTE ONLY): 10 min  Past Medical History:  Past Medical History:  Diagnosis Date   Acute on chronic respiratory failure with hypoxia (HCC)    Anxiety    Chronic lymphocytic leukemia in remission (HCC)    COPD (chronic obstructive pulmonary disease) (HCC)    COPD, severe (HCC)    GERD (gastroesophageal reflux disease)    Hematuria    Hyperlipidemia    Hypertension    Insomnia    Left lower lobe pneumonia    Nephrolithiasis    Tobacco dependence    Past Surgical History:  Past Surgical History:  Procedure Laterality Date   LITHOTRIPSY     HPI:  Pt is a 65 year old male followed by palliative care,  with hx of end stage COPD, partial code, chronic leukemia admitted for acute respiratory failure with hypercapnia also found to have PNA. Pt with prior FEES 03/07/21 revealing intact airway protection with mild pharyngeal residuals.    Assessment / Plan / Recommendation  Clinical Impression  Pt presents with grossly functional swallow. Pt without overt s/sx of aspiration with any PO including 3 oz water challenge, puree, and regular snack. Pt with baseline edentulous status, prefers mechanical soft textures. Pt and RN report good tolerance of diet since admission. Continue mechanical soft and thin liquid diet with aspiration precautions. No further ST needs identified.  SLP Visit Diagnosis: Dysphagia, unspecified (R13.10)    Aspiration Risk  Mild aspiration risk    Diet Recommendation   Dysphagia 3 (mechanical soft) thin liquids  Medication Administration: Whole meds with liquid    Other  Recommendations Oral Care Recommendations: Oral care BID    Recommendations for follow up therapy are one component of a multi-disciplinary discharge  planning process, led by the attending physician.  Recommendations may be updated based on patient status, additional functional criteria and insurance authorization.  Follow up Recommendations 24 hour supervision/assistance      Frequency and Duration   N/a         Prognosis        Swallow Study   General Date of Onset: 08/21/21 HPI: Pt is a 65 year old male followed by palliative care,  with hx of end stage COPD, partial code, chronic leukemia admitted for acute respiratory failure with hypercapnia also found to have PNA. Pt with prior FEES 03/07/21 revealing intact airway protection with mild pharyngeal residuals. Type of Study: Bedside Swallow Evaluation Previous Swallow Assessment: FEES 04/22 intact airway protection mild pharyngeal residuals Diet Prior to this Study: Dysphagia 3 (soft);Thin liquids Temperature Spikes Noted: No Respiratory Status: Nasal cannula History of Recent Intubation: No Behavior/Cognition: Alert;Cooperative;Pleasant mood Oral Cavity Assessment: Within Functional Limits Oral Care Completed by SLP: No Vision: Functional for self-feeding Patient Positioning: Upright in bed Baseline Vocal Quality: Low vocal intensity Volitional Swallow: Able to elicit    Oral/Motor/Sensory Function Overall Oral Motor/Sensory Function: Within functional limits   Ice Chips Ice chips: Within functional limits   Thin Liquid Thin Liquid: Within functional limits Presentation: Cup;Straw    Nectar Thick Nectar Thick Liquid: Not tested   Honey Thick Honey Thick Liquid: Not tested   Puree Puree: Within functional limits   Solid     Solid: Within functional limits Presentation: [place]. MA,  CCC-SLP Acute Rehabilitation Services   08/22/2021,10:13 AM

## 2021-08-22 NOTE — TOC Initial Note (Signed)
Transition of Care [facility]) - Initial/Assessment Note    Patient Details  Name: Isaac [name] MRN: [id number] Date of Birth: 11-Jan-1956  Transition of Care [facility]) CM/SW Contact:    Tom-Johnson, Renea Ee, RN Phone Number: 08/22/2021, 2:42 PM  Clinical Narrative:                 CM spoke with patient at bedside. Patient states he lives with his wife and have three supportive children. Retired from [employer]. Due to his decline in health, wife transports to and from appointments. Ambulates with walker at home and uses wheelchair when out of the house. Has a wheelchair, transfer chair, walker, shower chair, bedside commode, hospital bed and Oxygen. Active with [facility] home health for PT/RN services. Also active with [facility] for palliative services. Patient did not know the name of company that supplies oxygen and gave CM permission to speak with wife. CM called wife, Isaac [name] [phone]) and she she states they get Oxygen supplies from [place]. No TOC needs at this time. Patient not medically ready for discharge. CM will continue to follow with needs.        Barriers to Discharge: Continued Medical Work up   Patient Goals and CMS Choice Patient states their goals for this hospitalization and ongoing recovery are:: To go home      Expected Discharge Plan and Services     Discharge Planning Services: CM Consult   Living arrangements for the past 2 months: Single Family Home                                      Prior Living Arrangements/Services Living arrangements for the past 2 months: Single Family Home Lives with:: Spouse Patient language and need for interpreter reviewed:: Yes Do you feel safe going back to the place where you live?: Yes      Need for Family Participation in Patient Care: Yes (Comment) Care giver support system in place?: Yes (comment) Current home services: DME, Home PT, Home RN, Other (comment) (Palliative service with  [facility], has a walkwer, wheelchair, shower chair, hospital bed, bedside commode, transport chair. Oxygen by [employer] [facility].) Criminal Activity/Legal Involvement Pertinent to Current Situation/Hospitalization: No - Comment as needed  Activities of Daily Living      Permission Sought/Granted Permission sought to share information with : Case Manager, Family Supports Permission granted to share information with : Yes, Verbal Permission Granted              Emotional Assessment Appearance:: Appears stated age Attitude/Demeanor/Rapport: Engaged Affect (typically observed): Accepting, Appropriate, Hopeful Orientation: : Oriented to Self, Oriented to Place, Oriented to  Time, Oriented to Situation Alcohol / Substance Use: Not Applicable Psych Involvement: No (comment)  Admission diagnosis:  Acute respiratory failure with hypoxia and hypercapnia (HCC) [J96.01, J96.02] Acute on chronic respiratory failure with hypoxia and hypercapnia (HCC) [D22.02, J96.22] Patient Active Problem List   Diagnosis Date Noted   Respiratory arrest ([place]) 08/21/2021   Wound infection after surgery 08/21/2021   Hypotension 08/21/2021   Acute on chronic respiratory failure with hypoxia and hypercapnia (HCC) 08/21/2021   Volume overload 08/21/2021   Acute on chronic respiratory failure ([place]) 03/04/2021   HCAP (healthcare-associated pneumonia) 03/04/2021   Acute on chronic respiratory failure with hypoxia (HCC)    COPD, severe (HCC)    Left lower lobe pneumonia    Chronic lymphocytic leukemia in remission ([place])  CLL (chronic lymphocytic leukemia) (HCC)    Acute respiratory failure with hypercapnia (HCC) 12/08/2020   Hyperkalemia    Leukocytosis    AKI (acute kidney injury) ([place])    Healthcare maintenance 08/12/2020   Oral thrush 04/30/2017   Dysfunction of right eustachian tube 01/19/2016   Left ear impacted cerumen 01/19/2016   COPD, very severe ([place]) 08/22/2015   Chronic respiratory failure with  hypercapnia ([place]) 06/08/2014   Unspecified sleep apnea 06/08/2014   Chronic diastolic heart failure ([place]) 05/11/2014   Acute-on-chronic respiratory failure ([place]) 04/24/2014   Sinusitis 04/13/2014   Renal insufficiency 07/07/2012   Hypertension 07/07/2012   COPD exacerbation ([place]) 07/04/2012   COPD, severe ([place]) 04/25/2012   Sleep apnea 04/25/2012   Obesity 04/25/2012   PCP:  [facility] [place]:   [id number] [place], [place] [address] Phone: [phone] Fax: [phone]  [facility] [place], [place] [address] [place] [address] Phone: [phone] Fax: [place] [address] Phone: [phone] Fax: [phone]     Social Determinants of Health (SDOH) Interventions    Readmission Risk Interventions No flowsheet data found.

## 2021-08-22 NOTE — H&P (Addendum)
NAME:  Isaac [name] MRN:  [id number] DOB:  02/13/1956 LOS: 1 ADMISSION DATE:  08/21/2021 DATE OF SERVICE:  08/21/2021  CHIEF COMPLAINT:  respiratory arrest in the field   Progress Note  History of Present Illness  This 65 y.o. Caucasian male reformed smoker presented to the [facility] Emergency Department via EMS with complaints of respiratory arrest in the field (at home).  There are some discrepancies in charting in the EMR.  The HPI documented here is obtained from interview with the patient and patient's wife (at bedside).  The patinet was reportedly at his baseline status earlier in the eveing, when he and his wife watched football on TV together.  He went to sleep but awaked with an acute sense of dyspnea.  She notes that this has been happening more frequently lately, and he has recently had an increase in metoprolol dosage for suboptimal blood pressure control which was believed to be responsible for these symptoms.  He also endorses orthopnea.  He has a hospital bed at home and uses it to raise his head.  He was reportedly found to be in respiratory arrest.  He was supported with bag-mask ventilation to the ER, by which time he apparently had regained consciousness.  He was also noted to be hypertensive on arrival.  He was placed on BiPAP support.  While his mentation improved and respiratory status apppeared stable, he became hypotensive, and the ER started norepinephrine infusion.  At the time of clinical interview, the patient is noted to have MAP 87 on 12 mcg/min.  Of note, this patient is under palliative care.  He has end-stage COPD and chronic lymphocytic leukemia.  His CODE STATUS is PARTIAL CODE (agreeable to intubation if needed).  He is very clear that he does not want chest compressions, shocks or medications for a malignant arrhythmia.  In fact, he even states that he prefers to manage his respiratory status with BiPAP over intubation, if at all  possible.   Past Medical/Surgical/Social/Family History   Past Medical History:  Diagnosis Date   Acute on chronic respiratory failure with hypoxia (HCC)    Anxiety    Chronic lymphocytic leukemia in remission (HCC)    COPD (chronic obstructive pulmonary disease) (HCC)    COPD, severe (HCC)    GERD (gastroesophageal reflux disease)    Hematuria    Hyperlipidemia    Hypertension    Insomnia    Left lower lobe pneumonia    Nephrolithiasis    Tobacco dependence     Past Surgical History:  Procedure Laterality Date   LITHOTRIPSY      Social History   Tobacco Use   Smoking status: Former    Packs/day: 1.00    Years: 35.00    Pack years: 35.00    Types: Cigarettes    Quit date: 12/07/2008    Years since quitting: 12.7   Smokeless tobacco: Never  Substance Use Topics   Alcohol use: Not on file    Family History  Problem Relation Age of Onset   Heart disease Father    Stroke Mother    Leukemia Brother      Procedures:  None   Significant Diagnostic Tests:  9/27 CXR No significant change in the patchy bilateral airspace disease 9/27 Echo  Micro Data:   Results for orders placed or performed during the hospital encounter of 08/21/21  Resp Panel by RT-PCR (Flu A&B, Covid)     Status: None   Collection  Time: 08/21/21  3:50 AM  Result Value Ref Range Status   SARS Coronavirus 2 by RT PCR NEGATIVE NEGATIVE Final    Comment: (NOTE) SARS-CoV-2 target nucleic acids are NOT DETECTED.  The SARS-CoV-2 RNA is generally detectable in upper respiratory specimens during the acute phase of infection. The lowest concentration of SARS-CoV-2 viral copies this assay can detect is 138 copies/mL. A negative result does not preclude SARS-Cov-2 infection and should not be used as the sole basis for treatment or other patient management decisions. A negative result may occur with  improper specimen collection/handling, submission of specimen other than nasopharyngeal swab,  presence of viral mutation(s) within the areas targeted by this assay, and inadequate number of viral copies(<138 copies/mL). A negative result must be combined with clinical observations, patient history, and epidemiological information. The expected result is Negative.  Fact Sheet for Patients:  EntrepreneurPulse.com.au  Fact Sheet for Healthcare Providers:  IncredibleEmployment.be  This test is no t yet approved or cleared by the [place] FDA and  has been authorized for detection and/or diagnosis of SARS-CoV-2 by FDA under an Emergency Use Authorization (EUA). This EUA will remain  in effect (meaning this test can be used) for the duration of the COVID-19 declaration under Section 564(b)(1) of the Act, 21 U.S.C.section 360bbb-3(b)(1), unless the authorization is terminated  or revoked sooner.       Influenza A by PCR NEGATIVE NEGATIVE Final   Influenza B by PCR NEGATIVE NEGATIVE Final    Comment: (NOTE) The Xpert Xpress SARS-CoV-2/FLU/RSV plus assay is intended as an aid in the diagnosis of influenza from Nasopharyngeal swab specimens and should not be used as a sole basis for treatment. Nasal washings and aspirates are unacceptable for Xpert Xpress SARS-CoV-2/FLU/RSV testing.  Fact Sheet for Patients: EntrepreneurPulse.com.au  Fact Sheet for Healthcare Providers: IncredibleEmployment.be  This test is not yet approved or cleared by the [place] FDA and has been authorized for detection and/or diagnosis of SARS-CoV-2 by FDA under an Emergency Use Authorization (EUA). This EUA will remain in effect (meaning this test can be used) for the duration of the COVID-19 declaration under Section 564(b)(1) of the Act, 21 U.S.C. section 360bbb-3(b)(1), unless the authorization is terminated or revoked.  Performed at [facility], [place] [address] [place], [place] [address]   Culture, blood  (routine x 2)     Status: None (Preliminary result)   Collection Time: 08/21/21  5:08 AM   Specimen: BLOOD RIGHT FOREARM  Result Value Ref Range Status   Specimen Description BLOOD RIGHT FOREARM  Final   Special Requests   Final    BOTTLES DRAWN AEROBIC AND ANAEROBIC Blood Culture adequate volume   Culture   Final    NO GROWTH < 12 HOURS Performed at [facility], [place] [address] [place], [place] [address]    Report Status PENDING  Incomplete  Culture, blood (routine x 2)     Status: None (Preliminary result)   Collection Time: 08/21/21  5:10 AM   Specimen: BLOOD LEFT FOREARM  Result Value Ref Range Status   Specimen Description BLOOD LEFT FOREARM  Final   Special Requests   Final    BOTTLES DRAWN AEROBIC AND ANAEROBIC Blood Culture adequate volume   Culture  Setup Time   Final    NO ORGANISMS SEEN REINCUBATED BOTTLE ANA 08/21/21@23 :13 BY TW GRAM POSITIVE COCCI AEROBIC BOTTLE ONLY CRITICAL RESULT CALLED TO, READ BACK BY AND VERIFIED WITH: PHARMD LEDFORD 08/22/21@2 :39 BY TW  Culture   Final    NO GROWTH 1 DAY Performed at [facility], [place] [address], [place], [place] [address]    Report Status PENDING  Incomplete  Blood Culture ID Panel (Reflexed)     Status: Abnormal   Collection Time: 08/21/21  5:10 AM  Result Value Ref Range Status   Enterococcus faecalis NOT DETECTED NOT DETECTED Final   Enterococcus Faecium NOT DETECTED NOT DETECTED Final   Listeria monocytogenes NOT DETECTED NOT DETECTED Final   Staphylococcus species DETECTED (A) NOT DETECTED Final    Comment: CRITICAL RESULT CALLED TO, READ BACK BY AND VERIFIED WITH: PHARMD LEDFORD 08/22/21@2 :39 BY TW    Staphylococcus aureus (BCID) NOT DETECTED NOT DETECTED Final   Staphylococcus epidermidis DETECTED (A) NOT DETECTED Final    Comment: Methicillin (oxacillin) resistant coagulase negative staphylococcus. Possible blood culture contaminant (unless isolated from more than one blood culture draw or clinical  case suggests pathogenicity). No antibiotic treatment is indicated for blood  culture contaminants. CRITICAL RESULT CALLED TO, READ BACK BY AND VERIFIED WITH: PHARMD LEDFORD 08/22/21@2 :39 BY TW    Staphylococcus lugdunensis NOT DETECTED NOT DETECTED Final   Streptococcus species NOT DETECTED NOT DETECTED Final   Streptococcus agalactiae NOT DETECTED NOT DETECTED Final   Streptococcus pneumoniae NOT DETECTED NOT DETECTED Final   Streptococcus pyogenes NOT DETECTED NOT DETECTED Final   A.calcoaceticus-baumannii NOT DETECTED NOT DETECTED Final   Bacteroides fragilis NOT DETECTED NOT DETECTED Final   Enterobacterales NOT DETECTED NOT DETECTED Final   Enterobacter cloacae complex NOT DETECTED NOT DETECTED Final   Escherichia coli NOT DETECTED NOT DETECTED Final   Klebsiella aerogenes NOT DETECTED NOT DETECTED Final   Klebsiella oxytoca NOT DETECTED NOT DETECTED Final   Klebsiella pneumoniae NOT DETECTED NOT DETECTED Final   Proteus species NOT DETECTED NOT DETECTED Final   Salmonella species NOT DETECTED NOT DETECTED Final   Serratia marcescens NOT DETECTED NOT DETECTED Final   Haemophilus influenzae NOT DETECTED NOT DETECTED Final   Neisseria meningitidis NOT DETECTED NOT DETECTED Final   Pseudomonas aeruginosa NOT DETECTED NOT DETECTED Final   Stenotrophomonas maltophilia NOT DETECTED NOT DETECTED Final   Candida albicans NOT DETECTED NOT DETECTED Final   Candida auris NOT DETECTED NOT DETECTED Final   Candida glabrata NOT DETECTED NOT DETECTED Final   Candida krusei NOT DETECTED NOT DETECTED Final   Candida parapsilosis NOT DETECTED NOT DETECTED Final   Candida tropicalis NOT DETECTED NOT DETECTED Final   Cryptococcus neoformans/gattii NOT DETECTED NOT DETECTED Final   Methicillin resistance mecA/C DETECTED (A) NOT DETECTED Final    Comment: CRITICAL RESULT CALLED TO, READ BACK BY AND VERIFIED WITH: PHARMD LEDFORD 08/22/21@2 :39 BY TW Performed at [facility], [address], [place], [place] [address]   MRSA Next Gen by PCR, Nasal     Status: None   Collection Time: 08/21/21  6:55 AM   Specimen: Nasal Mucosa; Nasal Swab  Result Value Ref Range Status   MRSA by PCR Next Gen NOT DETECTED NOT DETECTED Final    Comment: (NOTE) The GeneXpert MRSA Assay (FDA approved for NASAL specimens only), is one component of a comprehensive MRSA colonization surveillance program. It is not intended to diagnose MRSA infection nor to guide or monitor treatment for MRSA infections. Test performance is not FDA approved in patients less than 85 years old. Performed at [facility], [place] [address] [place], [place] [address]       Antimicrobials:  Interim history/subjective:  Net negative 952 cc's  Refused BiPAP overnight  Asking nursing staff to increase oxygen despite says > 94% No labs drawn this am 9/27 CXR reviewed     Objective   BP 139/85 (BP Location: Right Arm)   Pulse 94   Temp 98.2 F (36.8 C) (Oral)   Resp 20   Ht 5\' 10"  (1.778 m)   Wt 98.7 kg   SpO2 95%   BMI 31.22 kg/m     Filed Weights   08/21/21 0050 08/22/21 0500  Weight: 98 kg 98.7 kg    Intake/Output Summary (Last 24 hours) at 08/22/2021 1035 Last data filed at 08/22/2021 1013 Gross per 24 hour  Intake 823.13 ml  Output 1276 ml  Net -452.87 ml    Vent Mode: BIPAP FiO2 (%):  [40 %] 40 % Set Rate:  [15 bmp] 15 bmp   Exam:  Gen: Adult male resting in bed on 3 L [place] without distress Neuro: Alert and oriented x 3,  moving all extremities equally, normal sensation Lungs: Bilateral Chest Excursion , No wheezing noted,  Diminished per bases Abdomen: Soft, non tender, non distended, BS + Ext: no edema, no rash, LUE surgicl wound with bleeding and clot noted Resolved Hospital Problem list   Hypotension  Improving respiratory failure   Assessment & Plan:   ASSESSMENT/PLAN:  ASSESSMENT (included in the Hospital Problem List)  Acute on Chronic Respiratory Failure End  Stage COPD, chronic home oxygen at 2 L [place], acute exacerbation  CAP Plan Continue Lasix 40 mg daily Trend I&O BiPAP Q HS>> patient refused last pm Titrate oxygen for sats > 90%, goal is to return to home 2 L  CXR in am and  Continue Pulmicort/ Duo Nebs, albuterol  OOB to chair PT / OT referral  Will stop solumedrol and start prednisone taper  Speech eval to RO silent aspiration   Hypotension with lactate of 2.9 >> off pressors Volume overload Chronic Diastolic Dysfunction  ECHO 12/08/2020: LVEF 60-65% with moderate left ventricular hypertrophy, RV normal size Plan Lasix 40 mg daily Trend Lactate Consider Echo Will check lactate in am  CLL Seen by oncology 9/20 WBC elevated, baseline WBC 90-140 as outpatient Plan Continue Imbruvica  ? Wound infection LUE Lesion removed by dermatology 1 week prior to admission Plan Assessed by wound RN 9/26 Recommend dry dressing and monitoring for drainage Trend WBC and fever curve Clean wound with saline Continue polysporin BID  Continue Rocephin    Endocrine  CBG Q 4 with SSI  Constipation  Plan Continue Miralax  Pt is alert, and on [place] with baseline exertional shortness of breath. He is refusing BiPAP at bedtime., but needs to use it while in the hospital. Will transfer to Progressive bed, and we will continue to follow as patient is a [facility] patient.    Best practice:  Diet: Dysphagia 3 Pain/Anxiety/Delirium protocol (if indicated): N/A VAP protocol (if indicated): YES DVT prophylaxis: heparin GI prophylaxis: Protonix Glucose control: check HgbA1c, CBG and SSI Mobility/Activity: bedrest   Code Status: Partial Code Family Communication:  No family at bedside Disposition: transfer to progressive bed and triad to manage.    Labs   CBC: Recent Labs  Lab 08/21/21 0057 08/21/21 0110 08/21/21 0352 08/21/21 0504 08/21/21 0508  WBC 119.2*  --   --  128.3*  --   HGB 12.6* 13.9 12.9* 12.4* 12.9*  HCT 44.5 41.0  38.0* 43.7 38.0*  MCV 90.1  --   --  90.1  --  PLT 261  --   --  279  --     Basic Metabolic Panel: Recent Labs  Lab 08/21/21 0057 08/21/21 0110 08/21/21 0352 08/21/21 0504 08/21/21 0508  NA 140 139 138 140 138  K 4.9 4.7 5.0 4.8 4.8  CL 100  --   --  96*  --   CO2 33*  --   --  35*  --   GLUCOSE 272*  --   --  217*  --   BUN 13  --   --  15  --   CREATININE 0.96  --   --  1.09  --   CALCIUM 8.6*  --   --  9.0  --   MG  --   --   --  2.8*  --    GFR: Estimated Creatinine Clearance: 79.6 mL/min (by C-G formula based on SCr of 1.09 mg/dL). Recent Labs  Lab 08/21/21 0057 08/21/21 0504 08/21/21 0813  WBC 119.2* 128.3*  --   LATICACIDVEN 1.4 1.7 2.9*    Liver Function Tests: Recent Labs  Lab 08/21/21 0057  AST 16  ALT 8  ALKPHOS 92  BILITOT 0.6  PROT 5.8*  ALBUMIN 3.1*   No results for input(s): LIPASE, AMYLASE in the last 168 hours. No results for input(s): AMMONIA in the last 168 hours.  ABG    Component Value Date/Time   PHART 7.328 (L) 08/21/2021 0508   PCO2ART 69.1 (HH) 08/21/2021 0508   PO2ART 88 08/21/2021 0508   HCO3 36.3 (H) 08/21/2021 0508   TCO2 38 (H) 08/21/2021 0508   ACIDBASEDEF 28.0 (H) 03/06/2021 0810   O2SAT 96.0 08/21/2021 0508     Coagulation Profile: No results for input(s): INR, PROTIME in the last 168 hours.  Cardiac Enzymes: No results for input(s): CKTOTAL, CKMB, CKMBINDEX, TROPONINI in the last 168 hours.  HbA1C: Hgb A1c MFr Bld  Date/Time Value Ref Range Status  08/21/2021 08:13 AM 5.6 4.8 - 5.6 % Final    Comment:    (NOTE) Pre diabetes:          5.7%-6.4%  Diabetes:              >6.4%  Glycemic control for   <7.0% adults with diabetes   03/08/2021 04:40 AM 6.0 (H) 4.8 - 5.6 % Final    Comment:    (NOTE) Pre diabetes:          5.7%-6.4%  Diabetes:              >6.4%  Glycemic control for   <7.0% adults with diabetes     CBG: Recent Labs  Lab 08/21/21 0707 08/21/21 1114 08/21/21 1503 08/21/21 2111  08/22/21 0710  GLUCAP 202* 179* 168* 158* 152*     Past Medical History   Past Medical History:  Diagnosis Date   Acute on chronic respiratory failure with hypoxia (HCC)    Anxiety    Chronic lymphocytic leukemia in remission (HCC)    COPD (chronic obstructive pulmonary disease) (HCC)    COPD, severe (HCC)    GERD (gastroesophageal reflux disease)    Hematuria    Hyperlipidemia    Hypertension    Insomnia    Left lower lobe pneumonia    Nephrolithiasis    Tobacco dependence       Surgical History    Past Surgical History:  Procedure Laterality Date   LITHOTRIPSY        Social History   Social History  Socioeconomic History   Marital status: Married    Spouse name: Not on file   Number of children: Not on file   Years of education: Not on file   Highest education level: Not on file  Occupational History   Occupation: unemployed  Tobacco Use   Smoking status: Former    Packs/day: 1.00    Years: 35.00    Pack years: 35.00    Types: Cigarettes    Quit date: 12/07/2008    Years since quitting: 12.7   Smokeless tobacco: Never  Vaping Use   Vaping Use: Never used  Substance and Sexual Activity   Alcohol use: Not on file   Drug use: Not on file   Sexual activity: Yes  Other Topics Concern   Not on file  Social History Narrative   Not on file   Social Determinants of Health   Financial Resource Strain: Not on file  Food Insecurity: Not on file  Transportation Needs: Not on file  Physical Activity: Not on file  Stress: Not on file  Social Connections: Not on file      Family History    Family History  Problem Relation Age of Onset   Heart disease Father    Stroke Mother    Leukemia Brother    family history includes Heart disease in his father; Leukemia in his brother; Stroke in his mother.    Allergies Allergies  Allergen Reactions   Codeine Itching and Other (See Comments)    "jittery"   Prozac [Fluoxetine Hcl] Other (See Comments)     confusion      Current Medications  Current Facility-Administered Medications:    0.9 %  sodium chloride infusion, 250 mL, Intravenous, Continuous, Renee Pain, MD   acetaminophen (TYLENOL) tablet 650 mg, 650 mg, Oral, Q4H PRN, Renee Pain, MD, 650 mg at 08/21/21 1437   albuterol (PROVENTIL) (2.5 MG/3ML) 0.083% nebulizer solution 2.5 mg, 2.5 mg, Nebulization, Q2H PRN, Renee Pain, MD   allopurinol (ZYLOPRIM) tablet 100 mg, 100 mg, Oral, Daily, Renee Pain, MD, 100 mg at 08/22/21 0828   budesonide (PULMICORT) nebulizer solution 0.5 mg, 0.5 mg, Nebulization, BID, Renee Pain, MD, 0.5 mg at 08/22/21 2025   cefTRIAXone (ROCEPHIN) 2 g in sodium chloride 0.9 % 100 mL IVPB, 2 g, Intravenous, Q24H, Pham, Minh Q, RPH-CPP, Last Rate: 200 mL/hr at 08/22/21 0320, 2 g at 08/22/21 0320   chlorhexidine (PERIDEX) 0.12 % solution 15 mL, 15 mL, Mouth Rinse, BID, Spero Geralds, MD, 15 mL at 08/22/21 1031   Chlorhexidine Gluconate Cloth 2 % PADS 6 each, 6 each, Topical, Daily, Spero Geralds, MD, 6 each at 08/21/21 1745   docusate sodium (COLACE) capsule 100 mg, 100 mg, Oral, BID PRN, Renee Pain, MD, 100 mg at 08/21/21 2118   docusate sodium (COLACE) capsule 100 mg, 100 mg, Oral, BID, Renee Pain, MD, 100 mg at 08/22/21 4270   furosemide (LASIX) injection 40 mg, 40 mg, Intravenous, Daily, Renee Pain, MD, 40 mg at 08/22/21 0926   heparin injection 5,000 Units, 5,000 Units, Subcutaneous, Q8H, Renee Pain, MD, 5,000 Units at 08/22/21 0514   ibrutinib TABS 420 mg, 420 mg, Oral, Daily, Renee Pain, MD   insulin aspart (novoLOG) injection 0-9 Units, 0-9 Units, Subcutaneous, TID WC, Hicks, Samantha B, NP, 2 Units at 08/22/21 0824   ipratropium-albuterol (DUONEB) 0.5-2.5 (3) MG/3ML nebulizer solution 3 mL, 3 mL, Nebulization, Q6H, Renee Pain, MD, 3 mL at 08/22/21 0728   MEDLINE mouth rinse, 15 mL,  Mouth Rinse, BID, Spero Geralds, MD   MEDLINE mouth rinse,  15 mL, Mouth Rinse, q12n4p, Spero Geralds, MD   methylPREDNISolone sodium succinate (SOLU-MEDROL) 125 mg/2 mL injection 60 mg, 60 mg, Intravenous, Q12H, Renee Pain, MD, 60 mg at 08/22/21 0313   montelukast (SINGULAIR) tablet 10 mg, 10 mg, Oral, QPM, Renee Pain, MD, 10 mg at 08/21/21 1723   ondansetron (ZOFRAN) injection 4 mg, 4 mg, Intravenous, Q6H PRN, Renee Pain, MD   pantoprazole (PROTONIX) injection 40 mg, 40 mg, Intravenous, Q24H, Hicks, Samantha B, NP, 40 mg at 08/21/21 1612   polyethylene glycol (MIRALAX / GLYCOLAX) packet 17 g, 17 g, Oral, Daily, Spero Geralds, MD, 17 g at 08/21/21 1611   polymixin-bacitracin (POLYSPORIN) ointment, , Topical, BID, Renee Pain, MD, 1 application at 16/10/96 0900   sodium chloride (OCEAN) 0.65 % nasal spray 2 spray, 2 spray, Each Nare, PRN, Renee Pain, MD   Home Medications  Prior to Admission medications   Medication Sig Start Date End Date Taking? Authorizing Provider  albuterol (PROVENTIL) (2.5 MG/3ML) 0.083% nebulizer solution Take 3 mLs (2.5 mg total) by nebulization every 4 (four) hours as needed for wheezing or shortness of breath. 12/19/20   Noe Gens L, NP  albuterol (VENTOLIN HFA) 108 (90 Base) MCG/ACT inhaler INHALE 2 PUFFS INTO THE LUNGS EVERY 4 (FOUR) HOURS AS NEEDED FOR WHEEZING OR SHORTNESS OF BREATH. 05/31/21   Brand Males, MD  allopurinol (ZYLOPRIM) 100 MG tablet Place 1 tablet (100 mg total) into feeding tube daily. 04/07/21   Wyatt Portela, MD  budesonide (PULMICORT) 0.5 MG/2ML nebulizer solution TAKE 2 ML BY NEBULIZATION 2 TIMES DAILY. 07/25/21   Brand Males, MD  cloNIDine (CATAPRES - DOSED IN MG/24 HR) 0.1 mg/24hr patch Place 0.1 mg onto the skin once a week. Tuesdays 03/02/21   [provider]  docusate sodium (COLACE) 100 MG capsule Take 100 mg by mouth at bedtime.    [provider]  Ensure (ENSURE) Take 237 mLs by mouth daily. Chocolate flavor    [provider]   furosemide (LASIX) 40 MG tablet Take 40 mg by mouth daily. 03/02/21   [provider]  guaiFENesin (MUCINEX) 600 MG 12 hr tablet Take 600 mg by mouth daily. 06/23/12   [provider]  ibrutinib (IMBRUVICA) 420 MG TABS Take 1 tablet (420 mg) by mouth daily. 07/25/21   Wyatt Portela, MD  ipratropium-albuterol (DUONEB) 0.5-2.5 (3) MG/3ML SOLN TAKE 3 MLS (ONE VIAL) BY NEBULIZATION EVERY 6 (SIX) HOURS AS NEEDED. 06/14/21   Brand Males, MD  metoprolol tartrate (LOPRESSOR) 25 MG tablet Take 25 mg by mouth 2 (two) times daily. 01/26/21   [provider]  montelukast (SINGULAIR) 10 MG tablet Take 10 mg by mouth every evening.    [provider]  pantoprazole (PROTONIX) 40 MG tablet Take 40 mg by mouth at bedtime. 03/02/21   [provider]  polyethylene glycol (MIRALAX / GLYCOLAX) 17 g packet Place 17 g into feeding tube daily as needed for moderate constipation. Patient taking differently: Take 17 g by mouth as needed for moderate constipation. 12/19/20   Donita Brooks, NP  sodium chloride (OCEAN) 0.65 % SOLN nasal spray Place 2 sprays into both nostrils as needed for congestion. 05/05/14   Erick Colace, NP      Critical care time: 45 minutes.     Magdalen Spatz, MSN, AGACNP-BC [place] for personal pager PCCM on call pager [phone])  [phone]  08/22/2021 10:35 AM

## 2021-08-22 NOTE — Progress Notes (Signed)
PHARMACY - PHYSICIAN COMMUNICATION CRITICAL VALUE ALERT - BLOOD CULTURE IDENTIFICATION (BCID)  Isaac [name] is an 65 y.o. male who presented to [facility] on 08/21/2021 with a chief complaint of respiratory failure   Name of physician (or Provider) Contacted: Dr. Genevive Bi  Current antibiotics: Ceftriaxone   Changes to prescribed antibiotics recommended:  None-likely contaminant   Results for orders placed or performed during the hospital encounter of 08/21/21  Blood Culture ID Panel (Reflexed) (Collected: 08/21/2021  5:10 AM)  Result Value Ref Range   Enterococcus faecalis NOT DETECTED NOT DETECTED   Enterococcus Faecium NOT DETECTED NOT DETECTED   Listeria monocytogenes NOT DETECTED NOT DETECTED   Staphylococcus species DETECTED (A) NOT DETECTED   Staphylococcus aureus (BCID) NOT DETECTED NOT DETECTED   Staphylococcus epidermidis DETECTED (A) NOT DETECTED   Staphylococcus lugdunensis NOT DETECTED NOT DETECTED   Streptococcus species NOT DETECTED NOT DETECTED   Streptococcus agalactiae NOT DETECTED NOT DETECTED   Streptococcus pneumoniae NOT DETECTED NOT DETECTED   Streptococcus pyogenes NOT DETECTED NOT DETECTED   A.calcoaceticus-baumannii NOT DETECTED NOT DETECTED   Bacteroides fragilis NOT DETECTED NOT DETECTED   Enterobacterales NOT DETECTED NOT DETECTED   Enterobacter cloacae complex NOT DETECTED NOT DETECTED   Escherichia coli NOT DETECTED NOT DETECTED   Klebsiella aerogenes NOT DETECTED NOT DETECTED   Klebsiella oxytoca NOT DETECTED NOT DETECTED   Klebsiella pneumoniae NOT DETECTED NOT DETECTED   Proteus species NOT DETECTED NOT DETECTED   Salmonella species NOT DETECTED NOT DETECTED   Serratia marcescens NOT DETECTED NOT DETECTED   Haemophilus influenzae NOT DETECTED NOT DETECTED   Neisseria meningitidis NOT DETECTED NOT DETECTED   Pseudomonas aeruginosa NOT DETECTED NOT DETECTED   Stenotrophomonas maltophilia NOT DETECTED NOT DETECTED   Candida albicans NOT  DETECTED NOT DETECTED   Candida auris NOT DETECTED NOT DETECTED   Candida glabrata NOT DETECTED NOT DETECTED   Candida krusei NOT DETECTED NOT DETECTED   Candida parapsilosis NOT DETECTED NOT DETECTED   Candida tropicalis NOT DETECTED NOT DETECTED   Cryptococcus neoformans/gattii NOT DETECTED NOT DETECTED   Methicillin resistance mecA/C DETECTED (A) NOT DETECTED    Narda Bonds 08/22/2021  3:48 AM

## 2021-08-22 NOTE — Progress Notes (Deleted)
NAME:  Isaac [name] MRN:  [id number] DOB:  November 17, 1956 LOS: 1 ADMISSION DATE:  08/21/2021 DATE OF SERVICE:  08/21/2021  CHIEF COMPLAINT:  respiratory arrest in the field   Progress Note  History of Present Illness  This 65 y.o. Caucasian male reformed smoker presented to the [facility] Department via EMS with complaints of respiratory arrest in the field (at home).  There are some discrepancies in charting in the EMR.  The HPI documented here is obtained from interview with the patient and patient's wife (at bedside).  The patinet was reportedly at his baseline status earlier in the eveing, when he and his wife watched football on TV together.  He went to sleep but awaked with an acute sense of dyspnea.  She notes that this has been happening more frequently lately, and he has recently had an increase in metoprolol dosage for suboptimal blood pressure control which was believed to be responsible for these symptoms.  He also endorses orthopnea.  He has a hospital bed at home and uses it to raise his head.  He was reportedly found to be in respiratory arrest.  He was supported with bag-mask ventilation to the ER, by which time he apparently had regained consciousness.  He was also noted to be hypertensive on arrival.  He was placed on BiPAP support.  While his mentation improved and respiratory status apppeared stable, he became hypotensive, and the ER started norepinephrine infusion.  At the time of clinical interview, the patient is noted to have MAP 87 on 12 mcg/min.  Of note, this patient is under palliative care.  He has end-stage COPD and chronic lymphocytic leukemia.  His CODE STATUS is PARTIAL CODE (agreeable to intubation if needed).  He is very clear that he does not want chest compressions, shocks or medications for a malignant arrhythmia.  In fact, he even states that he prefers to manage his respiratory status with BiPAP over intubation, if at all  possible.   Past Medical/Surgical/Social/Family History   Past Medical History:  Diagnosis Date   Acute on chronic respiratory failure with hypoxia (HCC)    Anxiety    Chronic lymphocytic leukemia in remission (HCC)    COPD (chronic obstructive pulmonary disease) (HCC)    COPD, severe (HCC)    GERD (gastroesophageal reflux disease)    Hematuria    Hyperlipidemia    Hypertension    Insomnia    Left lower lobe pneumonia    Nephrolithiasis    Tobacco dependence     Past Surgical History:  Procedure Laterality Date   LITHOTRIPSY      Social History   Tobacco Use   Smoking status: Former    Packs/day: 1.00    Years: 35.00    Pack years: 35.00    Types: Cigarettes    Quit date: 12/07/2008    Years since quitting: 12.7   Smokeless tobacco: Never  Substance Use Topics   Alcohol use: Not on file    Family History  Problem Relation Age of Onset   Heart disease Father    Stroke Mother    Leukemia Brother      Procedures:  None   Significant Diagnostic Tests:  9/27 CXR No significant change in the patchy bilateral airspace disease 9/27 Echo  Micro Data:   Results for orders placed or performed during the hospital encounter of 08/21/21  Resp Panel by RT-PCR (Flu A&B, Covid)     Status: None   Collection  Time: 08/21/21  3:50 AM  Result Value Ref Range Status   SARS Coronavirus 2 by RT PCR NEGATIVE NEGATIVE Final    Comment: (NOTE) SARS-CoV-2 target nucleic acids are NOT DETECTED.  The SARS-CoV-2 RNA is generally detectable in upper respiratory specimens during the acute phase of infection. The lowest concentration of SARS-CoV-2 viral copies this assay can detect is 138 copies/mL. A negative result does not preclude SARS-Cov-2 infection and should not be used as the sole basis for treatment or other patient management decisions. A negative result may occur with  improper specimen collection/handling, submission of specimen other than nasopharyngeal swab,  presence of viral mutation(s) within the areas targeted by this assay, and inadequate number of viral copies(<138 copies/mL). A negative result must be combined with clinical observations, patient history, and epidemiological information. The expected result is Negative.  Fact Sheet for Patients:  EntrepreneurPulse.com.au  Fact Sheet for Healthcare Providers:  IncredibleEmployment.be  This test is no t yet approved or cleared by the [place] FDA and  has been authorized for detection and/or diagnosis of SARS-CoV-2 by FDA under an Emergency Use Authorization (EUA). This EUA will remain  in effect (meaning this test can be used) for the duration of the COVID-19 declaration under Section 564(b)(1) of the Act, 21 U.S.C.section 360bbb-3(b)(1), unless the authorization is terminated  or revoked sooner.       Influenza A by PCR NEGATIVE NEGATIVE Final   Influenza B by PCR NEGATIVE NEGATIVE Final    Comment: (NOTE) The Xpert Xpress SARS-CoV-2/FLU/RSV plus assay is intended as an aid in the diagnosis of influenza from Nasopharyngeal swab specimens and should not be used as a sole basis for treatment. Nasal washings and aspirates are unacceptable for Xpert Xpress SARS-CoV-2/FLU/RSV testing.  Fact Sheet for Patients: EntrepreneurPulse.com.au  Fact Sheet for Healthcare Providers: IncredibleEmployment.be  This test is not yet approved or cleared by the [place] FDA and has been authorized for detection and/or diagnosis of SARS-CoV-2 by FDA under an Emergency Use Authorization (EUA). This EUA will remain in effect (meaning this test can be used) for the duration of the COVID-19 declaration under Section 564(b)(1) of the Act, 21 U.S.C. section 360bbb-3(b)(1), unless the authorization is terminated or revoked.  Performed at [facility], [place] [address], [place], [place] [address]   Culture, blood  (routine x 2)     Status: None (Preliminary result)   Collection Time: 08/21/21  5:08 AM   Specimen: BLOOD RIGHT FOREARM  Result Value Ref Range Status   Specimen Description BLOOD RIGHT FOREARM  Final   Special Requests   Final    BOTTLES DRAWN AEROBIC AND ANAEROBIC Blood Culture adequate volume   Culture   Final    NO GROWTH 1 DAY Performed at [facility], [place] [address], [place], [place] [address]    Report Status PENDING  Incomplete  Culture, blood (routine x 2)     Status: None (Preliminary result)   Collection Time: 08/21/21  5:10 AM   Specimen: BLOOD LEFT FOREARM  Result Value Ref Range Status   Specimen Description BLOOD LEFT FOREARM  Final   Special Requests   Final    BOTTLES DRAWN AEROBIC AND ANAEROBIC Blood Culture adequate volume   Culture  Setup Time   Final    NO ORGANISMS SEEN REINCUBATED BOTTLE ANA 08/21/21@23 :13 BY TW GRAM POSITIVE COCCI AEROBIC BOTTLE ONLY CRITICAL RESULT CALLED TO, READ BACK BY AND VERIFIED WITH: PHARMD LEDFORD 08/22/21@2 :39 BY TW  Culture   Final    NO GROWTH 1 DAY Performed at [facility], [place] [address], [place], [place] [address]    Report Status PENDING  Incomplete  Blood Culture ID Panel (Reflexed)     Status: Abnormal   Collection Time: 08/21/21  5:10 AM  Result Value Ref Range Status   Enterococcus faecalis NOT DETECTED NOT DETECTED Final   Enterococcus Faecium NOT DETECTED NOT DETECTED Final   Listeria monocytogenes NOT DETECTED NOT DETECTED Final   Staphylococcus species DETECTED (A) NOT DETECTED Final    Comment: CRITICAL RESULT CALLED TO, READ BACK BY AND VERIFIED WITH: PHARMD LEDFORD 08/22/21@2 :39 BY TW    Staphylococcus aureus (BCID) NOT DETECTED NOT DETECTED Final   Staphylococcus epidermidis DETECTED (A) NOT DETECTED Final    Comment: Methicillin (oxacillin) resistant coagulase negative staphylococcus. Possible blood culture contaminant (unless isolated from more than one blood culture draw or clinical case  suggests pathogenicity). No antibiotic treatment is indicated for blood  culture contaminants. CRITICAL RESULT CALLED TO, READ BACK BY AND VERIFIED WITH: PHARMD LEDFORD 08/22/21@2 :39 BY TW    Staphylococcus lugdunensis NOT DETECTED NOT DETECTED Final   Streptococcus species NOT DETECTED NOT DETECTED Final   Streptococcus agalactiae NOT DETECTED NOT DETECTED Final   Streptococcus pneumoniae NOT DETECTED NOT DETECTED Final   Streptococcus pyogenes NOT DETECTED NOT DETECTED Final   A.calcoaceticus-baumannii NOT DETECTED NOT DETECTED Final   Bacteroides fragilis NOT DETECTED NOT DETECTED Final   Enterobacterales NOT DETECTED NOT DETECTED Final   Enterobacter cloacae complex NOT DETECTED NOT DETECTED Final   Escherichia coli NOT DETECTED NOT DETECTED Final   Klebsiella aerogenes NOT DETECTED NOT DETECTED Final   Klebsiella oxytoca NOT DETECTED NOT DETECTED Final   Klebsiella pneumoniae NOT DETECTED NOT DETECTED Final   Proteus species NOT DETECTED NOT DETECTED Final   Salmonella species NOT DETECTED NOT DETECTED Final   Serratia marcescens NOT DETECTED NOT DETECTED Final   Haemophilus influenzae NOT DETECTED NOT DETECTED Final   Neisseria meningitidis NOT DETECTED NOT DETECTED Final   Pseudomonas aeruginosa NOT DETECTED NOT DETECTED Final   Stenotrophomonas maltophilia NOT DETECTED NOT DETECTED Final   Candida albicans NOT DETECTED NOT DETECTED Final   Candida auris NOT DETECTED NOT DETECTED Final   Candida glabrata NOT DETECTED NOT DETECTED Final   Candida krusei NOT DETECTED NOT DETECTED Final   Candida parapsilosis NOT DETECTED NOT DETECTED Final   Candida tropicalis NOT DETECTED NOT DETECTED Final   Cryptococcus neoformans/gattii NOT DETECTED NOT DETECTED Final   Methicillin resistance mecA/C DETECTED (A) NOT DETECTED Final    Comment: CRITICAL RESULT CALLED TO, READ BACK BY AND VERIFIED WITH: PHARMD LEDFORD 08/22/21@2 :39 BY TW Performed at [facility], [address]  [place], [place] [address]   MRSA Next Gen by PCR, Nasal     Status: None   Collection Time: 08/21/21  6:55 AM   Specimen: Nasal Mucosa; Nasal Swab  Result Value Ref Range Status   MRSA by PCR Next Gen NOT DETECTED NOT DETECTED Final    Comment: (NOTE) The GeneXpert MRSA Assay (FDA approved for NASAL specimens only), is one component of a comprehensive MRSA colonization surveillance program. It is not intended to diagnose MRSA infection nor to guide or monitor treatment for MRSA infections. Test performance is not FDA approved in patients less than 73 years old. Performed at [facility], [place] [address] [place], [place] [address]       Antimicrobials:  Interim history/subjective:  Net negative 952 cc's  Refused BiPAP overnight  Asking nursing staff to increase oxygen despite says > 94% No labs drawn this am 9/27 CXR reviewed     Objective   BP (!) 161/74   Pulse 95   Temp 99 F (37.2 C) (Oral)   Resp 19   Ht 5\' 10"  (1.778 m)   Wt 98.7 kg   SpO2 95%   BMI 31.22 kg/m     Filed Weights   08/21/21 0050 08/22/21 0500  Weight: 98 kg 98.7 kg    Intake/Output Summary (Last 24 hours) at 08/22/2021 1455 Last data filed at 08/22/2021 1300 Gross per 24 hour  Intake 733.13 ml  Output 1826 ml  Net -1092.87 ml    FiO2 (%):  [40 %] 40 %   Exam:  Gen: Adult male resting in bed on 3 L [place] without distress Neuro: Alert and oriented x 3,  moving all extremities equally, normal sensation Lungs: Bilateral Chest Excursion , No wheezing noted,  Diminished per bases Abdomen: Soft, non tender, non distended, BS + Ext: no edema, no rash, LUE surgicl wound with bleeding and clot noted Resolved Hospital Problem list   Hypotension  Improving respiratory failure   Assessment & Plan:   ASSESSMENT/PLAN:  ASSESSMENT (included in the Hospital Problem List)  Acute on Chronic Respiratory Failure End Stage COPD, chronic home oxygen at 2 L [place], acute exacerbation   CAP Plan Continue Lasix 40 mg daily Trend I&O BiPAP Q HS>> patient refused last pm Titrate oxygen for sats > 90%, goal is to return to home 2 L  CXR in am and  Continue Pulmicort/ Duo Nebs, albuterol  OOB to chair PT / OT referral  Will stop solumedrol and start prednisone taper  Speech eval to RO silent aspiration   Hypotension with lactate of 2.9 >> off pressors Volume overload Chronic Diastolic Dysfunction  ECHO 12/08/2020: LVEF 60-65% with moderate left ventricular hypertrophy, RV normal size Plan Lasix 40 mg daily Trend Lactate Consider Echo Will check lactate in am  CLL Seen by oncology 9/20 WBC elevated, baseline WBC 90-140 as outpatient Plan Continue Imbruvica  ? Wound infection LUE Lesion removed by dermatology 1 week prior to admission Plan Assessed by wound RN 9/26 Recommend dry dressing and monitoring for drainage Trend WBC and fever curve Clean wound with saline Continue polysporin BID  Continue Rocephin    Endocrine  CBG Q 4 with SSI  Constipation  Plan Continue Miralax  Pt is alert, and on [address] with baseline exertional shortness of breath. He is refusing BiPAP at bedtime., but needs to use it while in the hospital. Will transfer to Progressive bed, and we will continue to follow as patient is a [facility] patient.    Best practice:  Diet: Dysphagia 3 Pain/Anxiety/Delirium protocol (if indicated): N/A VAP protocol (if indicated): YES DVT prophylaxis: heparin GI prophylaxis: Protonix Glucose control: check HgbA1c, CBG and SSI Mobility/Activity: bedrest   Code Status: Partial Code Family Communication:  No family at bedside Disposition: transfer to progressive bed and triad to manage.    Critical care time: 45 minutes.     Magdalen Spatz, MSN, AGACNP-BC [place] for personal pager PCCM on call pager [phone]  08/22/2021 10:34 am

## 2021-08-23 ENCOUNTER — Inpatient Hospital Stay (HOSPITAL_COMMUNITY): Payer: BC Managed Care – PPO

## 2021-08-23 DIAGNOSIS — C9110 Chronic lymphocytic leukemia of B-cell type not having achieved remission: Secondary | ICD-10-CM | POA: Diagnosis not present

## 2021-08-23 DIAGNOSIS — I5032 Chronic diastolic (congestive) heart failure: Secondary | ICD-10-CM | POA: Diagnosis not present

## 2021-08-23 DIAGNOSIS — J9621 Acute and chronic respiratory failure with hypoxia: Secondary | ICD-10-CM | POA: Diagnosis not present

## 2021-08-23 DIAGNOSIS — J441 Chronic obstructive pulmonary disease with (acute) exacerbation: Secondary | ICD-10-CM | POA: Diagnosis not present

## 2021-08-23 DIAGNOSIS — E8770 Fluid overload, unspecified: Secondary | ICD-10-CM

## 2021-08-23 LAB — COMPREHENSIVE METABOLIC PANEL
ALT: 10 U/L (ref 0–44)
AST: 12 U/L — ABNORMAL LOW (ref 15–41)
Albumin: 3.1 g/dL — ABNORMAL LOW (ref 3.5–5.0)
Alkaline Phosphatase: 67 U/L (ref 38–126)
Anion gap: 10 (ref 5–15)
BUN: 34 mg/dL — ABNORMAL HIGH (ref 8–23)
CO2: 30 mmol/L (ref 22–32)
Calcium: 8.8 mg/dL — ABNORMAL LOW (ref 8.9–10.3)
Chloride: 97 mmol/L — ABNORMAL LOW (ref 98–111)
Creatinine, Ser: 1.09 mg/dL (ref 0.61–1.24)
GFR, Estimated: >60 mL/min (ref >60)
Glucose, Bld: 193 mg/dL — ABNORMAL HIGH (ref 70–99)
Potassium: 4.7 mmol/L (ref 3.5–5.1)
Sodium: 137 mmol/L (ref 135–145)
Total Bilirubin: 0.4 mg/dL (ref 0.3–1.2)
Total Protein: 5.3 g/dL — ABNORMAL LOW (ref 6.5–8.1)

## 2021-08-23 LAB — GLUCOSE, CAPILLARY
Glucose-Capillary: 174 mg/dL — ABNORMAL HIGH (ref 70–99)
Glucose-Capillary: 156 mg/dL — ABNORMAL HIGH (ref 70–99)
Glucose-Capillary: 159 mg/dL — ABNORMAL HIGH (ref 70–99)
Glucose-Capillary: 188 mg/dL — ABNORMAL HIGH (ref 70–99)
Glucose-Capillary: 155 mg/dL — ABNORMAL HIGH (ref 70–99)

## 2021-08-23 LAB — CULTURE, BLOOD (ROUTINE X 2)
Culture  Setup Time: NONE SEEN
Special Requests: ADEQUATE

## 2021-08-23 LAB — LACTIC ACID, PLASMA: Lactic Acid, Venous: 1.5 mmol/L (ref 0.5–1.9)

## 2021-08-23 LAB — MAGNESIUM: Magnesium: 2.3 mg/dL (ref 1.7–2.4)

## 2021-08-23 MED ORDER — CLONIDINE HCL 0.1 MG/24HR TD PTWK
0.1000 mg | MEDICATED_PATCH | TRANSDERMAL | Status: DC
  Administered 2021-08-24: 0.1 mg via TRANSDERMAL
  Filled 2021-08-23: qty 1

## 2021-08-23 MED ORDER — METOPROLOL TARTRATE 12.5 MG HALF TABLET
12.5000 mg | ORAL_TABLET | Freq: Two times a day (BID) | ORAL | Status: DC
  Administered 2021-08-23 – 2021-08-24 (×2): 12.5 mg via ORAL
  Filled 2021-08-23 (×2): qty 1

## 2021-08-23 MED FILL — CLONIDINE 0.1 MG/DAY PATCH: 84 days supply | Qty: 12 | Fill #0

## 2021-08-23 NOTE — Evaluation (Signed)
Physical Therapy Evaluation Patient Details Name: Isaac [name] MRN: [id number] DOB: 04/16/1956 Today's Date: 08/23/2021  History of Present Illness  Pt adm 9/27 with acute on chronic hypoxemic and hypercapnic respiratory failure and end stage COPD. PMH - end stage COPD, CLL, anxiety disorder, morbid obesity, HTN, OSA, trach  Clinical Impression  Pt admitted with above diagnosis and presents to PT with functional limitations due to deficits listed below (See PT problem list). Pt needs skilled PT to maximize independence and safety to allow discharge to return home with family assist.         Recommendations for follow up therapy are one component of a multi-disciplinary discharge planning process, led by the attending physician.  Recommendations may be updated based on patient status, additional functional criteria and insurance authorization.  Follow Up Recommendations Home health PT;Supervision - Intermittent    Equipment Recommendations  None recommended by PT    Recommendations for Other Services       Precautions / Restrictions Precautions Precautions: Fall      Mobility  Bed Mobility Overal bed mobility: Needs Assistance Bed Mobility: Supine to Sit     Supine to sit: Min assist     General bed mobility comments: Assist to elevate trunk into sitting    Transfers Overall transfer level: Needs assistance Equipment used: Rolling walker (2 wheeled) Transfers: Sit to/from Stand Sit to Stand: Min guard         General transfer comment: Assist for safety and lines  Ambulation/Gait Ambulation/Gait assistance: Min guard Gait Distance (Feet): 5 Feet Assistive device: Rolling walker (2 wheeled) Gait Pattern/deviations: Step-through pattern;Decreased step length - right;Decreased step length - left Gait velocity: decr Gait velocity interpretation: <1.31 ft/sec, indicative of household ambulator General Gait Details: Assist for Social worker    Modified Rankin (Stroke Patients Only)       Balance Overall balance assessment: Needs assistance Sitting-balance support: No upper extremity supported;Feet supported Sitting balance-Leahy Scale: Fair     Standing balance support: Bilateral upper extremity supported Standing balance-Leahy Scale: Poor Standing balance comment: walker and min guard for static standing                             Pertinent Vitals/Pain Pain Assessment: No/denies pain    Home Living Family/patient expects to be discharged to:: Private residence Living Arrangements: Spouse/significant other Available Help at Discharge: Family;Available 24 hours/day Type of Home: House Home Access: Ramped entrance     Home Layout: One level Home Equipment: Transport chair;Bedside commode;Walker - 2 wheels;Wheelchair - Education administrator (comment) (home O2)      Prior Function Level of Independence: Needs assistance   Gait / Transfers Assistance Needed: mod independent with rolling walker for short household distances  ADL's / Homemaking Assistance Needed: Wife assist with dressing and sponge bath        Hand Dominance   Dominant Hand: Right    Extremity/Trunk Assessment   Upper Extremity Assessment Upper Extremity Assessment: Defer to OT evaluation    Lower Extremity Assessment Lower Extremity Assessment: Generalized weakness       Communication   Communication: No difficulties  Cognition Arousal/Alertness: Awake/alert Behavior During Therapy: WFL for tasks assessed/performed Overall Cognitive Status: Within Functional Limits for tasks assessed  General Comments General comments (skin integrity, edema, etc.): RHR 81. SpO2 95% on 2L. SpO2 to 86% with amb. Incr O2 to 3L to recover to 90% in sitting    Exercises     Assessment/Plan    PT Assessment Patient needs continued PT services  PT Problem  List Decreased strength;Decreased activity tolerance;Decreased balance;Decreased mobility       PT Treatment Interventions DME instruction;Gait training;Functional mobility training;Therapeutic activities;Therapeutic exercise;Balance training;Patient/family education    PT Goals (Current goals can be found in the Care Plan section)  Acute Rehab PT Goals Patient Stated Goal: return home PT Goal Formulation: With patient Time For Goal Achievement: 09/06/21 Potential to Achieve Goals: Good    Frequency Min 3X/week   Barriers to discharge        Co-evaluation PT/OT/SLP Co-Evaluation/Treatment: Yes Reason for Co-Treatment: Other (comment) (Limited activity tolerance) PT goals addressed during session: Mobility/safety with mobility         AM-PAC PT "6 Clicks" Mobility  Outcome Measure Help needed turning from your back to your side while in a flat bed without using bedrails?: None Help needed moving from lying on your back to sitting on the side of a flat bed without using bedrails?: A Little Help needed moving to and from a bed to a chair (including a wheelchair)?: A Little Help needed standing up from a chair using your arms (e.g., wheelchair or bedside chair)?: A Little Help needed to walk in hospital room?: A Little Help needed climbing 3-5 steps with a railing? : A Lot 6 Click Score: 18    End of Session Equipment Utilized During Treatment: Gait belt;Oxygen Activity Tolerance: Patient limited by fatigue Patient left: in chair;with call bell/phone within reach;with chair alarm set Nurse Communication: Mobility status PT Visit Diagnosis: Unsteadiness on feet (R26.81);Other abnormalities of gait and mobility (R26.89);Muscle weakness (generalized) (M62.81)    Time: 2585-2778 PT Time Calculation (min) (ACUTE ONLY): 25 min   Charges:   PT Evaluation $PT Eval Moderate Complexity: [place] Pager [phone] Office  [place] 08/23/2021, 10:00 AM

## 2021-08-23 NOTE — Progress Notes (Signed)
[facility]  PROGRESS NOTE  Isaac [name] KDT:[id number] DOB: 1956/11/07 DOA: 08/21/2021 PCP: [facility]   Brief HPI:   65 year old male with history of chronic hypoxemic respiratory failure, chronic lymphocytic leukemia, COPD, hypertension, nephrolithiasis, anxiety, hyperlipidemia presented to the hospital with respiratory arrest at home.  History is not clear regarding circumstances which led to respiratory arrest.  EMS was called, he was provided bag mask ventilation to the ER.  By the time he came to the ER he regained consciousness. He was placed on BiPAP.    Subjective   Patient seen and examined, denies shortness of breath.   Assessment/Plan:     Acute on chronic hypoxemic/hypercapnic respiratory failure -Patient has end-stage COPD on chronically home O2 at 2 L/min via [place] -Chest x-ray showed improving bibasilar aeration, indicating of improving edema or pneumonia -Improving with antibiotics, continue ceftriaxone -Continue prednisone taper -Continue Pulmicort, duo nebs, albuterol  History of CLL -WBC is elevated at 82.4 -He has chronically elevated WBC -Patient is on ibrutinib; continued in the hospital -Followed by oncology as outpatient  Bleeding from biopsy site -Patient had lesion removed from left upper extremity as outpatient at dermatology office -Found to have bleeding from the biopsy site -Was seen by wound care RN -Continue wound care with Polysporin twice daily, dry dressing  ?  Acute on chronic diastolic CHF -Patient started on Lasix 40 mg IV daily -Net -2 L -Continue with Lasix; strict intake and output -Follow BMP in am  Hypertension -Likely rebound hypertension from not restarting Catapres and metoprolol -We will restart Catapres 0.1 mg patch q. 7 days -We will restart low-dose metoprolol 12.5 mg twice daily          Data Reviewed:   CBG:  Recent Labs  Lab 08/22/21 1742 08/22/21 2205 08/23/21 0813  08/23/21 1128 08/23/21 1507  GLUCAP 168* 230* 174* 156* 159*    SpO2: 100 % O2 Flow Rate (L/min): 2 L/min FiO2 (%): 40 %    Vitals:   08/23/21 1745 08/23/21 1800 08/23/21 1815 08/23/21 1830  BP:  (!) 173/108    Pulse: 81 90 71 72  Resp: 18 (!) 22 (!) 21 19  Temp:      TempSrc:      SpO2: 99% 100% 100% 100%  Weight:      Height:         Intake/Output Summary (Last 24 hours) at 08/23/2021 1834 Last data filed at 08/23/2021 1406 Gross per 24 hour  Intake 1210 ml  Output 1650 ml  Net -440 ml    09/26 1901 - 09/28 0700 In: 9983 [P.O.:1140; I.V.:40] Out: 2326 [Urine:2325]  Filed Weights   08/21/21 0050 08/22/21 0500 08/23/21 0500  Weight: 98 kg 98.7 kg 98.7 kg    Data Reviewed: Basic Metabolic Panel: Recent Labs  Lab 08/21/21 0057 08/21/21 0110 08/21/21 0352 08/21/21 0504 08/21/21 0508 08/23/21 0320  NA 140 139 138 140 138 137  K 4.9 4.7 5.0 4.8 4.8 4.7  CL 100  --   --  96*  --  97*  CO2 33*  --   --  35*  --  30  GLUCOSE 272*  --   --  217*  --  193*  BUN 13  --   --  15  --  34*  CREATININE 0.96  --   --  1.09  --  1.09  CALCIUM 8.6*  --   --  9.0  --  8.8*  MG  --   --   --  2.8*  --  2.3   Liver Function Tests: Recent Labs  Lab 08/21/21 0057 08/23/21 0320  AST 16 12*  ALT 8 10  ALKPHOS 92 67  BILITOT 0.6 0.4  PROT 5.8* 5.3*  ALBUMIN 3.1* 3.1*   No results for input(s): LIPASE, AMYLASE in the last 168 hours. No results for input(s): AMMONIA in the last 168 hours. CBC: Recent Labs  Lab 08/21/21 0057 08/21/21 0110 08/21/21 0352 08/21/21 0504 08/21/21 0508 08/22/21 1127  WBC 119.2*  --   --  128.3*  --  82.4*  NEUTROABS  --   --   --   --   --  20.6*  HGB 12.6* 13.9 12.9* 12.4* 12.9* 10.7*  HCT 44.5 41.0 38.0* 43.7 38.0* 36.4*  MCV 90.1  --   --  90.1  --  86.9  PLT 261  --   --  279  --  210   Cardiac Enzymes: No results for input(s): CKTOTAL, CKMB, CKMBINDEX, TROPONINI in the last 168 hours. BNP (last 3 results) Recent Labs     12/08/20 0100 03/04/21 1555 08/21/21 0504  BNP 146.1* 93.2 142.9*    ProBNP (last 3 results) No results for input(s): PROBNP in the last 8760 hours.  CBG: Recent Labs  Lab 08/22/21 1742 08/22/21 2205 08/23/21 0813 08/23/21 1128 08/23/21 1507  GLUCAP 168* 230* 174* 156* 159*    Recent Results (from the past 240 hour(s))  Resp Panel by RT-PCR (Flu A&B, Covid)     Status: None   Collection Time: 08/21/21  3:50 AM  Result Value Ref Range Status   SARS Coronavirus 2 by RT PCR NEGATIVE NEGATIVE Final    Comment: (NOTE) SARS-CoV-2 target nucleic acids are NOT DETECTED.  The SARS-CoV-2 RNA is generally detectable in upper respiratory specimens during the acute phase of infection. The lowest concentration of SARS-CoV-2 viral copies this assay can detect is 138 copies/mL. A negative result does not preclude SARS-Cov-2 infection and should not be used as the sole basis for treatment or other patient management decisions. A negative result may occur with  improper specimen collection/handling, submission of specimen other than nasopharyngeal [name], presence of viral mutation(s) within the areas targeted by this assay, and inadequate number of viral copies(<138 copies/mL). A negative result must be combined with clinical observations, patient history, and epidemiological information. The expected result is Negative.  Fact Sheet for Patients:  EntrepreneurPulse.com.au  Fact Sheet for Healthcare Providers:  IncredibleEmployment.be  This test is no t yet approved or cleared by the [place] FDA and  has been authorized for detection and/or diagnosis of SARS-CoV-2 by FDA under an Emergency Use Authorization (EUA). This EUA will remain  in effect (meaning this test can be used) for the duration of the COVID-19 declaration under Section 564(b)(1) of the Act, 21 U.S.C.section 360bbb-3(b)(1), unless the authorization is terminated  or revoked  sooner.       Influenza A by PCR NEGATIVE NEGATIVE Final   Influenza B by PCR NEGATIVE NEGATIVE Final    Comment: (NOTE) The Xpert Xpress SARS-CoV-2/FLU/RSV plus assay is intended as an aid in the diagnosis of influenza from Nasopharyngeal [name] specimens and should not be used as a sole basis for treatment. Nasal washings and aspirates are unacceptable for Xpert Xpress SARS-CoV-2/FLU/RSV testing.  Fact Sheet for Patients: EntrepreneurPulse.com.au  Fact Sheet for Healthcare Providers: IncredibleEmployment.be  This test is not yet approved or cleared by the [place] FDA and has been authorized for detection and/or diagnosis of SARS-CoV-2  by FDA under an Emergency Use Authorization (EUA). This EUA will remain in effect (meaning this test can be used) for the duration of the COVID-19 declaration under Section 564(b)(1) of the Act, 21 U.S.C. section 360bbb-3(b)(1), unless the authorization is terminated or revoked.  Performed at [facility], [place] [address], [place], [place] [address]   Culture, blood (routine x 2)     Status: None (Preliminary result)   Collection Time: 08/21/21  5:08 AM   Specimen: BLOOD RIGHT FOREARM  Result Value Ref Range Status   Specimen Description BLOOD RIGHT FOREARM  Final   Special Requests   Final    BOTTLES DRAWN AEROBIC AND ANAEROBIC Blood Culture adequate volume   Culture   Final    NO GROWTH 2 DAYS Performed at [facility], [place] [address], [place], [place] [address]    Report Status PENDING  Incomplete  Culture, blood (routine x 2)     Status: Abnormal   Collection Time: 08/21/21  5:10 AM   Specimen: BLOOD LEFT FOREARM  Result Value Ref Range Status   Specimen Description BLOOD LEFT FOREARM  Final   Special Requests   Final    BOTTLES DRAWN AEROBIC AND ANAEROBIC Blood Culture adequate volume   Culture  Setup Time   Final    NO ORGANISMS SEEN REINCUBATED BOTTLE ANA 08/21/21@23 :13 BY  TW GRAM POSITIVE COCCI AEROBIC BOTTLE ONLY CRITICAL RESULT CALLED TO, READ BACK BY AND VERIFIED WITH: PHARMD LEDFORD 08/22/21@2 :39 BY TW    Culture (A)  Final    STAPHYLOCOCCUS EPIDERMIDIS THE SIGNIFICANCE OF ISOLATING THIS ORGANISM FROM A SINGLE SET OF BLOOD CULTURES WHEN MULTIPLE SETS ARE DRAWN IS UNCERTAIN. PLEASE NOTIFY THE MICROBIOLOGY DEPARTMENT WITHIN ONE WEEK IF SPECIATION AND SENSITIVITIES ARE REQUIRED.    Report Status 08/23/2021 FINAL  Final  Blood Culture ID Panel (Reflexed)     Status: Abnormal   Collection Time: 08/21/21  5:10 AM  Result Value Ref Range Status   Enterococcus faecalis NOT DETECTED NOT DETECTED Final   Enterococcus Faecium NOT DETECTED NOT DETECTED Final   Listeria monocytogenes NOT DETECTED NOT DETECTED Final   Staphylococcus species DETECTED (A) NOT DETECTED Final    Comment: CRITICAL RESULT CALLED TO, READ BACK BY AND VERIFIED WITH: PHARMD LEDFORD 08/22/21@2 :39 BY TW    Staphylococcus aureus (BCID) NOT DETECTED NOT DETECTED Final   Staphylococcus epidermidis DETECTED (A) NOT DETECTED Final    Comment: Methicillin (oxacillin) resistant coagulase negative staphylococcus. Possible blood culture contaminant (unless isolated from more than one blood culture draw or clinical case suggests pathogenicity). No antibiotic treatment is indicated for blood  culture contaminants. CRITICAL RESULT CALLED TO, READ BACK BY AND VERIFIED WITH: PHARMD LEDFORD 08/22/21@2 :39 BY TW    Staphylococcus lugdunensis NOT DETECTED NOT DETECTED Final   Streptococcus species NOT DETECTED NOT DETECTED Final   Streptococcus agalactiae NOT DETECTED NOT DETECTED Final   Streptococcus pneumoniae NOT DETECTED NOT DETECTED Final   Streptococcus pyogenes NOT DETECTED NOT DETECTED Final   A.calcoaceticus-baumannii NOT DETECTED NOT DETECTED Final   Bacteroides fragilis NOT DETECTED NOT DETECTED Final   Enterobacterales NOT DETECTED NOT DETECTED Final   Enterobacter cloacae complex NOT DETECTED  NOT DETECTED Final   Escherichia coli NOT DETECTED NOT DETECTED Final   Klebsiella aerogenes NOT DETECTED NOT DETECTED Final   Klebsiella oxytoca NOT DETECTED NOT DETECTED Final   Klebsiella pneumoniae NOT DETECTED NOT DETECTED Final   Proteus species NOT DETECTED NOT DETECTED Final   Salmonella species NOT DETECTED  NOT DETECTED Final   Serratia marcescens NOT DETECTED NOT DETECTED Final   Haemophilus influenzae NOT DETECTED NOT DETECTED Final   Neisseria meningitidis NOT DETECTED NOT DETECTED Final   Pseudomonas aeruginosa NOT DETECTED NOT DETECTED Final   Stenotrophomonas maltophilia NOT DETECTED NOT DETECTED Final   Candida albicans NOT DETECTED NOT DETECTED Final   Candida auris NOT DETECTED NOT DETECTED Final   Candida glabrata NOT DETECTED NOT DETECTED Final   Candida krusei NOT DETECTED NOT DETECTED Final   Candida parapsilosis NOT DETECTED NOT DETECTED Final   Candida tropicalis NOT DETECTED NOT DETECTED Final   Cryptococcus neoformans/gattii NOT DETECTED NOT DETECTED Final   Methicillin resistance mecA/C DETECTED (A) NOT DETECTED Final    Comment: CRITICAL RESULT CALLED TO, READ BACK BY AND VERIFIED WITH: PHARMD LEDFORD 08/22/21@2 :39 BY TW Performed at [place] [address] [place], [place] [address]   MRSA Next Gen by PCR, Nasal     Status: None   Collection Time: 08/21/21  6:55 AM   Specimen: Nasal Mucosa; Nasal [name]  Result Value Ref Range Status   MRSA by PCR Next Gen NOT DETECTED NOT DETECTED Final    Comment: (NOTE) The GeneXpert MRSA Assay (FDA approved for NASAL specimens only), is one component of a comprehensive MRSA colonization surveillance program. It is not intended to diagnose MRSA infection nor to guide or monitor treatment for MRSA infections. Test performance is not FDA approved in patients less than 75 years old. Performed at [facility], [place] [address], [place], [place] [address]      Radiology Reports  DG CHEST PORT 1  VIEW  Result Date: 08/23/2021 CLINICAL DATA:  Respiratory distress. EXAM: PORTABLE CHEST 1 VIEW COMPARISON:  08/22/2021 and CT chest 08/21/2021. FINDINGS: Trachea is midline. Heart size stable. Mild residual hazy opacification in the lung bases, right greater than left. No definite pleural fluid. Bone island in the left fifth posterior rib. IMPRESSION: Improving bibasilar aeration, indicative of improving edema or pneumonia. Electronically Signed   By: Lorin Picket M.D.   On: 08/23/2021 09:17   DG Chest Port 1 View  Result Date: 08/22/2021 CLINICAL DATA:  Pulmonary edema. EXAM: PORTABLE CHEST 1 VIEW COMPARISON:  08/21/2021 FINDINGS: Single view of the chest again demonstrates patchy bilateral airspace disease most prominent in the lower lungs. Heart size is upper limits of normal and stable. Negative for a pneumothorax. Again noted is focal sclerosis involving the posterior left fifth rib. Negative for a pneumothorax. IMPRESSION: No significant change in the patchy bilateral airspace disease. Electronically Signed   By: Markus Daft M.D.   On: 08/22/2021 07:55     Scheduled medications:    allopurinol  100 mg Oral Daily   budesonide (PULMICORT) nebulizer solution  0.5 mg Nebulization BID   chlorhexidine  15 mL Mouth Rinse BID   Chlorhexidine Gluconate Cloth  6 each Topical Daily   docusate sodium  100 mg Oral BID   furosemide  40 mg Intravenous Daily   heparin  5,000 Units Subcutaneous Q8H   ibrutinib  420 mg Oral Daily   insulin aspart  0-9 Units Subcutaneous TID WC   ipratropium-albuterol  3 mL Nebulization Q6H   mouth rinse  15 mL Mouth Rinse BID   mouth rinse  15 mL Mouth Rinse q12n4p   montelukast  10 mg Oral QPM   pantoprazole  40 mg Oral Daily   polyethylene glycol  17 g Oral Daily   polymixin-bacitracin   Topical BID  predniSONE  40 mg Oral Q breakfast   Followed by   Derrill Memo ON 08/26/2021] predniSONE  30 mg Oral Q breakfast   Followed by   Derrill Memo ON 08/29/2021] predniSONE  20  mg Oral Q breakfast   Followed by   Derrill Memo ON 09/01/2021] predniSONE  10 mg Oral Q breakfast    Antibiotics: Anti-infectives (From admission, onward)    Start     Dose/Rate Route Frequency Ordered Stop   08/21/21 0400  vancomycin (VANCOREADY) IVPB 1250 mg/250 mL  Status:  Discontinued        1,250 mg 166.7 mL/hr over 90 Minutes Intravenous Every 12 hours 08/21/21 0336 08/21/21 1513   08/21/21 0400  cefTRIAXone (ROCEPHIN) 2 g in sodium chloride 0.9 % 100 mL IVPB        2 g 200 mL/hr over 30 Minutes Intravenous Every 24 hours 08/21/21 0336 08/25/21 2359         DVT prophylaxis: Heparin  Code Status: Full code  Family Communication: No family at bedside   Consultants: PCCM  Procedures:     Objective    Physical Examination:  General-appears in no acute distress Heart-S1-S2, regular, no murmur auscultated Lungs-clear to auscultation bilaterally, no wheezing or crackles auscultated Abdomen-soft, nontender, no organomegaly Extremities-no edema in the lower extremities Neuro-alert, oriented x3, no focal deficit noted   Status is: Inpatient  Dispo: The patient is from: Home              Anticipated d/c is to: Home              Anticipated d/c date is: 08/24/2021              Patient currently not stable for discharge  Barrier to discharge-ongoing treatment for acute respiratory failure  COVID-19 Labs  No results for input(s): DDIMER, FERRITIN, LDH, CRP in the last 72 hours.  Lab Results  Component Value Date   [place] NEGATIVE 08/21/2021   [place] NEGATIVE 03/04/2021   [place] NEGATIVE 12/08/2020              [address]   [facility] If 7PM-7AM, please contact night-coverage at [email or web address], Office  [phone]   08/23/2021, 6:34 PM  LOS: 2 days

## 2021-08-23 NOTE — Progress Notes (Signed)
NAME:  Isaac [name] MRN:  [id number] DOB:  [birth year] LOS: 2 ADMISSION DATE:  08/21/2021 DATE OF SERVICE:  08/21/2021  CHIEF COMPLAINT:  respiratory arrest in the field   Progress Note  History of Present Illness  This 65 y.o. Caucasian male reformed smoker presented to the [facility] Emergency Department via EMS with complaints of respiratory arrest in the field (at home).  There are some discrepancies in charting in the EMR.  The HPI documented here is obtained from interview with the patient and patient's wife (at bedside).  The patinet was reportedly at his baseline status earlier in the eveing, when he and his wife watched football on TV together.  He went to sleep but awaked with an acute sense of dyspnea.  She notes that this has been happening more frequently lately, and he has recently had an increase in metoprolol dosage for suboptimal blood pressure control which was believed to be responsible for these symptoms.  He also endorses orthopnea.  He has a hospital bed at home and uses it to raise his head.  He was reportedly found to be in respiratory arrest.  He was supported with bag-mask ventilation to the ER, by which time he apparently had regained consciousness.  He was also noted to be hypertensive on arrival.  He was placed on BiPAP support.  While his mentation improved and respiratory status apppeared stable, he became hypotensive, and the ER started norepinephrine infusion.  At the time of clinical interview, the patient is noted to have MAP 87 on 12 mcg/min.  Of note, this patient is under palliative care.  He has end-stage COPD and chronic lymphocytic leukemia.  His CODE STATUS is PARTIAL CODE (agreeable to intubation if needed).  He is very clear that he does not want chest compressions, shocks or medications for a malignant arrhythmia.  In fact, he even states that he prefers to manage his respiratory status with BiPAP over intubation, if at all  possible.   Past Medical/Surgical/Social/Family History   Past Medical History:  Diagnosis Date   Acute on chronic respiratory failure with hypoxia (HCC)    Anxiety    Chronic lymphocytic leukemia in remission (HCC)    COPD (chronic obstructive pulmonary disease) (HCC)    COPD, severe (HCC)    GERD (gastroesophageal reflux disease)    Hematuria    Hyperlipidemia    Hypertension    Insomnia    Left lower lobe pneumonia    Nephrolithiasis    Tobacco dependence     Past Surgical History:  Procedure Laterality Date   LITHOTRIPSY      Social History   Tobacco Use   Smoking status: Former    Packs/day: 1.00    Years: 35.00    Pack years: 35.00    Types: Cigarettes    Quit date: 12/07/2008    Years since quitting: 12.7   Smokeless tobacco: Never  Substance Use Topics   Alcohol use: Not on file    Family History  Problem Relation Age of Onset   Heart disease Father    Stroke Mother    Leukemia Brother      Procedures:  None   Significant Diagnostic Tests:  9/28 CXR Stable to improving bibasilar aeration    Interim history/subjective:  Refused bipap overnight. Still bleeding from outpatient derm biopsy site.    Objective   BP (!) 160/80 (BP Location: Right Arm)   Pulse 92   Temp 98.4 F (36.9 C) (  Oral)   Resp (!) 21   Ht 5\' 10"  (1.778 m)   Wt 98.7 kg   SpO2 94%   BMI 31.22 kg/m     Filed Weights   08/21/21 0050 08/22/21 0500 08/23/21 0500  Weight: 98 kg 98.7 kg 98.7 kg    Intake/Output Summary (Last 24 hours) at 08/23/2021 2119 Last data filed at 08/23/2021 0818 Gross per 24 hour  Intake 1270 ml  Output 1800 ml  Net -530 ml        Exam:  Gen: Adult male resting in bed on 3 L [employer] without distress Neuro: AO x4 Lungs: diminished bilaterally, no wheezes or crackles Abdomen: Soft, non tender, non distended, BS + Ext: no edema LUE has bleeding biopsy site with thrombi pad  Resolved Hospital Problem list   Hypotension    Assessment &  Plan:   Acute on Chronic Hypoxemic and  Hypercapnic Respiratory Failure End Stage COPD, chronic home oxygen at 2 L [place], acute exacerbation  CAP Plan Continue Lasix 40 mg daily for HFpEF Trend I&O BiPAP Q HS>> patient refused last pm. High risk for requiring intubation or having readmissions because of this.  Titrate oxygen for sats > 90%, goal is to return to home 2 L  Continue Pulmicort/ Duo Nebs, albuterol  Continue prednisone taper as ordered Complete 5 days abx for CAP.   Non pulmonary issues: CLL Bleeding derm biopsy site (from outpatient)  PCCM will continue to follow. I force him to wear BIPAP.   Lenice Llamas, MD [facility] [place]    Labs   CBC: Recent Labs  Lab 08/21/21 [phone] 08/21/21 0110 08/21/21 0352 08/21/21 0504 08/21/21 0508 08/22/21 1127  WBC 119.2*  --   --  128.3*  --  82.4*  NEUTROABS  --   --   --   --   --  20.6*  HGB 12.6* 13.9 12.9* 12.4* 12.9* 10.7*  HCT 44.5 41.0 38.0* 43.7 38.0* 36.4*  MCV 90.1  --   --  90.1  --  86.9  PLT 261  --   --  279  --  081    Basic Metabolic Panel: Recent Labs  Lab 08/21/21 0057 08/21/21 0110 08/21/21 0352 08/21/21 0504 08/21/21 0508 08/23/21 0320  NA 140 139 138 140 138 137  K 4.9 4.7 5.0 4.8 4.8 4.7  CL 100  --   --  96*  --  97*  CO2 33*  --   --  35*  --  30  GLUCOSE 272*  --   --  217*  --  193*  BUN 13  --   --  15  --  34*  CREATININE 0.96  --   --  1.09  --  1.09  CALCIUM 8.6*  --   --  9.0  --  8.8*  MG  --   --   --  2.8*  --  2.3   GFR: Estimated Creatinine Clearance: 79.6 mL/min (by C-G formula based on SCr of 1.09 mg/dL). Recent Labs  Lab 08/21/21 0057 08/21/21 0504 08/21/21 0813 08/22/21 1127 08/23/21 0320  WBC 119.2* 128.3*  --  82.4*  --   LATICACIDVEN 1.4 1.7 2.9*  --  1.5    Liver Function Tests: Recent Labs  Lab 08/21/21 0057 08/23/21 0320  AST 16 12*  ALT 8 10  ALKPHOS 92 67  BILITOT 0.6 0.4  PROT 5.8* 5.3*  ALBUMIN 3.1*  3.1*   No results  for input(s): LIPASE, AMYLASE in the last 168 hours. No results for input(s): AMMONIA in the last 168 hours.  ABG    Component Value Date/Time   PHART 7.328 (L) 08/21/2021 0508   PCO2ART 69.1 (HH) 08/21/2021 0508   PO2ART 88 08/21/2021 0508   HCO3 36.3 (H) 08/21/2021 0508   TCO2 38 (H) 08/21/2021 0508   ACIDBASEDEF 28.0 (H) 03/06/2021 0810   O2SAT 96.0 08/21/2021 0508     Coagulation Profile: No results for input(s): INR, PROTIME in the last 168 hours.  Cardiac Enzymes: No results for input(s): CKTOTAL, CKMB, CKMBINDEX, TROPONINI in the last 168 hours.  HbA1C: Hgb A1c MFr Bld  Date/Time Value Ref Range Status  08/21/2021 08:13 AM 5.6 4.8 - 5.6 % Final    Comment:    (NOTE) Pre diabetes:          5.7%-6.4%  Diabetes:              >6.4%  Glycemic control for   <7.0% adults with diabetes   03/08/2021 04:40 AM 6.0 (H) 4.8 - 5.6 % Final    Comment:    (NOTE) Pre diabetes:          5.7%-6.4%  Diabetes:              >6.4%  Glycemic control for   <7.0% adults with diabetes     CBG: Recent Labs  Lab 08/22/21 1055 08/22/21 1505 08/22/21 1742 08/22/21 2205 08/23/21 0813  GLUCAP 164* 158* 168* 230* 174*

## 2021-08-23 NOTE — Progress Notes (Signed)
Patient refuses Bipap at night when sleeping. Remains on [place] at 2L, saturation remains >93%.

## 2021-08-23 NOTE — Evaluation (Signed)
Occupational Therapy Evaluation Patient Details Name: Isaac [name] MRN: [id number] DOB: 03/29/1956 Today's Date: 08/23/2021   History of Present Illness Pt adm 9/27 with acute on chronic hypoxemic and hypercapnic respiratory failure and end stage COPD. PMH - end stage COPD, CLL, anxiety disorder, morbid obesity, HTN, OSA, trach   Clinical Impression   Pt typically at home and mod I for mobility with RW, gets assist for LB dressing and overall sponge bathing from wife. He enjoys watching TV and is currently getting HHPT. Today Pt is overall min A with RW for mobility in the room. He did desaturate on 2L to 86% with slow recovery >5 min to 90%. After 10 min was back up to 96%. He was max A for LB ADL below the knees, presents with decreased activity tolerance, decreased balance, and will benefit from skilled OT in the acute setting as well as afterwards at the [facility] level to maximize safety and independence in ADL and functional transfers. Pt expressed particular interest in being able to shower again. Next session to focus on energy conservation and increasing activity tolerance (maybe intro AE kit?)      Recommendations for follow up therapy are one component of a multi-disciplinary discharge planning process, led by the attending physician.  Recommendations may be updated based on patient status, additional functional criteria and insurance authorization.   Follow Up Recommendations  Home health OT;Supervision - Intermittent    Equipment Recommendations  None recommended by OT (Pt has appropriate DME)    Recommendations for Other Services PT consult     Precautions / Restrictions Precautions Precautions: Fall Precaution Comments: watch O2 Restrictions Weight Bearing Restrictions: No Other Position/Activity Restrictions: L arm with wound that has had trouble with bleeding - be mindful      Mobility Bed Mobility Overal bed mobility: Needs Assistance Bed Mobility: Supine to Sit      Supine to sit: Min assist     General bed mobility comments: Assist to elevate trunk into sitting    Transfers Overall transfer level: Needs assistance Equipment used: Rolling walker (2 wheeled) Transfers: Sit to/from Stand Sit to Stand: Min guard         General transfer comment: Assist for safety and lines    Balance Overall balance assessment: Needs assistance Sitting-balance support: No upper extremity supported;Feet supported Sitting balance-Leahy Scale: Fair     Standing balance support: Bilateral upper extremity supported Standing balance-Leahy Scale: Poor Standing balance comment: walker and min guard for static standing                           ADL either performed or assessed with clinical judgement   ADL Overall ADL's : Needs assistance/impaired                                             Vision         Perception     Praxis      Pertinent Vitals/Pain Pain Assessment: No/denies pain     Hand Dominance Right   Extremity/Trunk Assessment Upper Extremity Assessment Upper Extremity Assessment: Generalized weakness (LUE with acewrap bandage - but functional)   Lower Extremity Assessment Lower Extremity Assessment: Defer to PT evaluation   Cervical / Trunk Assessment Cervical / Trunk Assessment: Other exceptions Cervical / Trunk Exceptions: weight loss of over 100 lbs  over the past year   Communication Communication Communication: No difficulties   Cognition Arousal/Alertness: Awake/alert Behavior During Therapy: WFL for tasks assessed/performed Overall Cognitive Status: Within Functional Limits for tasks assessed                                 General Comments: assume at baseline, A&Ox4   General Comments  RHR 81. SpO2 95% on 2L. SpO2 to 86% with amb. Incr O2 to 3L to recover to 90% in sitting    Exercises     Shoulder Instructions      Home Living Family/patient expects to be  discharged to:: Private residence Living Arrangements: Spouse/significant other Available Help at Discharge: Family;Available 24 hours/day Type of Home: House Home Access: Ramped entrance     Home Layout: One level     Bathroom Shower/Tub: Occupational psychologist: Handicapped height Bathroom Accessibility: Yes How Accessible: Accessible via walker (sideways) Home Equipment: Transport chair;Bedside commode;Walker - 2 wheels;Wheelchair - Education administrator (comment) (home O2)          Prior Functioning/Environment Level of Independence: Needs assistance  Gait / Transfers Assistance Needed: mod independent with rolling walker for short household distances ADL's / Homemaking Assistance Needed: Wife assist with dressing and sponge bath            OT Problem List: Decreased activity tolerance;Impaired balance (sitting and/or standing);Cardiopulmonary status limiting activity      OT Treatment/Interventions: Self-care/ADL training;Energy conservation;Therapeutic exercise;Therapeutic activities;Patient/family education;Balance training    OT Goals(Current goals can be found in the care plan section) Acute Rehab OT Goals Patient Stated Goal: be able to shower again at home instead of sponge baths OT Goal Formulation: With patient Time For Goal Achievement: 09/06/21 Potential to Achieve Goals: Good ADL Goals Pt Will Perform Grooming: with modified independence;sitting Pt Will Perform Upper Body Dressing: with modified independence;sitting Pt Will Perform Lower Body Dressing: with min guard assist;sit to/from stand Pt Will Transfer to Toilet: with supervision;ambulating Pt Will Perform Toileting - Clothing Manipulation and hygiene: with supervision;sit to/from stand Pt Will Perform Tub/Shower Transfer: Shower transfer;with supervision;with caregiver independent in assisting;ambulating;3 in 1 Additional ADL Goal #1: Pt will verbalize 3 energy conservation strategies to implement  during ADL routine with no cues  OT Frequency: Min 2X/week   Barriers to D/C:            Co-evaluation PT/OT/SLP Co-Evaluation/Treatment: Yes Reason for Co-Treatment: Other (comment);To address functional/ADL transfers (limited activity tolerance) PT goals addressed during session: Mobility/safety with mobility;Balance;Proper use of DME OT goals addressed during session: ADL's and self-care;Proper use of Adaptive equipment and DME      AM-PAC OT "6 Clicks" Daily Activity     Outcome Measure Help from another person eating meals?: None Help from another person taking care of personal grooming?: A Little Help from another person toileting, which includes using toliet, bedpan, or urinal?: A Little Help from another person bathing (including washing, rinsing, drying)?: A Lot Help from another person to put on and taking off regular upper body clothing?: A Little Help from another person to put on and taking off regular lower body clothing?: A Lot 6 Click Score: 17   End of Session Equipment Utilized During Treatment: Gait belt;Rolling walker;Oxygen (2L) Nurse Communication: Mobility status;Other (comment) (O2 saturations)  Activity Tolerance: Patient tolerated treatment well Patient left: in chair;with call bell/phone within reach;with chair alarm set  OT Visit Diagnosis: Muscle weakness (generalized) (M62.81);Other (  comment) (cadriopulmonary status limiting)                Time: 0931-1216 OT Time Calculation (min): 25 min Charges:  OT General Charges $OT Visit: 1 Visit OT Evaluation $OT Eval Moderate Complexity: [place] OTR/L Acute Rehabilitation Services Pager: [phone] Office: [place] 08/23/2021, 10:36 AM

## 2021-08-24 ENCOUNTER — Other Ambulatory Visit (HOSPITAL_COMMUNITY): Payer: Self-pay

## 2021-08-24 ENCOUNTER — Telehealth: Payer: Self-pay | Admitting: Pulmonary Disease

## 2021-08-24 LAB — CBC
HCT: 37.3 % — ABNORMAL LOW (ref 39.0–52.0)
Hemoglobin: 11.1 g/dL — ABNORMAL LOW (ref 13.0–17.0)
MCH: 25.8 pg — ABNORMAL LOW (ref 26.0–34.0)
MCHC: 29.8 g/dL — ABNORMAL LOW (ref 30.0–36.0)
MCV: 86.5 fL (ref 80.0–100.0)
Platelets: 185 10*3/uL (ref 150–400)
RBC: 4.31 MIL/uL (ref 4.22–5.81)
RDW: 13.8 % (ref 11.5–15.5)
WBC: 76.5 10*3/uL (ref 4.0–10.5)
nRBC: 0 % (ref 0.0–0.2)

## 2021-08-24 LAB — COMPREHENSIVE METABOLIC PANEL
ALT: 9 U/L (ref 0–44)
AST: 12 U/L — ABNORMAL LOW (ref 15–41)
Albumin: 3.1 g/dL — ABNORMAL LOW (ref 3.5–5.0)
Alkaline Phosphatase: 62 U/L (ref 38–126)
Anion gap: 7 (ref 5–15)
BUN: 25 mg/dL — ABNORMAL HIGH (ref 8–23)
CO2: 33 mmol/L — ABNORMAL HIGH (ref 22–32)
Calcium: 8.9 mg/dL (ref 8.9–10.3)
Chloride: 99 mmol/L (ref 98–111)
Creatinine, Ser: 1 mg/dL (ref 0.61–1.24)
GFR, Estimated: >60 mL/min (ref >60)
Glucose, Bld: 100 mg/dL — ABNORMAL HIGH (ref 70–99)
Potassium: 4.2 mmol/L (ref 3.5–5.1)
Sodium: 139 mmol/L (ref 135–145)
Total Bilirubin: 0.6 mg/dL (ref 0.3–1.2)
Total Protein: 5.4 g/dL — ABNORMAL LOW (ref 6.5–8.1)

## 2021-08-24 LAB — GLUCOSE, CAPILLARY
Glucose-Capillary: 112 mg/dL — ABNORMAL HIGH (ref 70–99)
Glucose-Capillary: 129 mg/dL — ABNORMAL HIGH (ref 70–99)

## 2021-08-24 MED ORDER — HYDRALAZINE HCL 25 MG PO TABS
25.0000 mg | ORAL_TABLET | Freq: Once | ORAL | Status: AC
  Administered 2021-08-24: 25 mg via ORAL
  Filled 2021-08-24: qty 1

## 2021-08-24 MED ORDER — PREDNISONE 10 MG PO TABS: ORAL_TABLET | ORAL | 0 refills | Status: DC

## 2021-08-24 MED ORDER — AZITHROMYCIN 500 MG PO TABS: 500.0000 mg | ORAL_TABLET | Freq: Every day | ORAL | 0 refills | Status: AC

## 2021-08-24 MED ORDER — METOPROLOL TARTRATE 12.5 MG HALF TABLET
12.5000 mg | ORAL_TABLET | Freq: Once | ORAL | Status: AC
  Administered 2021-08-24: 12.5 mg via ORAL
  Filled 2021-08-24: qty 1

## 2021-08-24 MED ORDER — ALPRAZOLAM 0.5 MG PO TABS
0.5000 mg | ORAL_TABLET | Freq: Once | ORAL | Status: AC
  Administered 2021-08-24: 0.5 mg via ORAL
  Filled 2021-08-24: qty 1

## 2021-08-24 MED FILL — PREDNISONE 10 MG TABS: 11 days supply | Qty: 30 | Fill #0

## 2021-08-24 NOTE — Progress Notes (Addendum)
NAME:  ALEXANDERJAMES BERG MRN:  [id number] DOB:  September 30, 1956 LOS: 3 ADMISSION DATE:  08/21/2021 DATE OF SERVICE:  08/21/2021  CHIEF COMPLAINT:  respiratory arrest in the field   Progress Note  History of Present Illness  This 65 y.o. Caucasian male reformed smoker presented to the [facility] Department via EMS with complaints of respiratory arrest in the field (at home).  There are some discrepancies in charting in the EMR.  The HPI documented here is obtained from interview with the patient and patient's wife (at bedside).  The patinet was reportedly at his baseline status earlier in the eveing, when he and his wife watched football on TV together.  He went to sleep but awaked with an acute sense of dyspnea.  She notes that this has been happening more frequently lately, and he has recently had an increase in metoprolol dosage for suboptimal blood pressure control which was believed to be responsible for these symptoms.  He also endorses orthopnea.  He has a hospital bed at home and uses it to raise his head.  He was reportedly found to be in respiratory arrest.  He was supported with bag-mask ventilation to the ER, by which time he apparently had regained consciousness.  He was also noted to be hypertensive on arrival.  He was placed on BiPAP support.  While his mentation improved and respiratory status apppeared stable, he became hypotensive, and the ER started norepinephrine infusion.  At the time of clinical interview, the patient is noted to have MAP 87 on 12 mcg/min.  Of note, this patient is under palliative care.  He has end-stage COPD and chronic lymphocytic leukemia.  His CODE STATUS is PARTIAL CODE (agreeable to intubation if needed).  He is very clear that he does not want chest compressions, shocks or medications for a malignant arrhythmia.  In fact, he even states that he prefers to manage his respiratory status with BiPAP over intubation, if at all  possible.   Past Medical/Surgical/Social/Family History   Past Medical History:  Diagnosis Date   Acute on chronic respiratory failure with hypoxia (HCC)    Anxiety    Chronic lymphocytic leukemia in remission (HCC)    COPD (chronic obstructive pulmonary disease) (HCC)    COPD, severe (HCC)    GERD (gastroesophageal reflux disease)    Hematuria    Hyperlipidemia    Hypertension    Insomnia    Left lower lobe pneumonia    Nephrolithiasis    Tobacco dependence     Past Surgical History:  Procedure Laterality Date   LITHOTRIPSY      Social History   Tobacco Use   Smoking status: Former    Packs/day: 1.00    Years: 35.00    Pack years: 35.00    Types: Cigarettes    Quit date: 12/07/2008    Years since quitting: 12.7   Smokeless tobacco: Never  Substance Use Topics   Alcohol use: Not on file    Family History  Problem Relation Age of Onset   Heart disease Father    Stroke Mother    Leukemia Brother      Procedures:  None   Significant Diagnostic Tests:  9/28 CXR Stable to improving bibasilar aeration    Interim history/subjective:  Refused bipap again overnight. States mask doesn't fit well On 2 L [place]   Objective   BP (!) 166/89   Pulse 66   Temp 98.5 F (36.9 C) (Oral)  Resp 20   Ht 5\' 10"  (1.778 m)   Wt 97.2 kg   SpO2 100%   BMI 30.75 kg/m     Filed Weights   08/22/21 0500 08/23/21 0500 08/24/21 0123  Weight: 98.7 kg 98.7 kg 97.2 kg    Intake/Output Summary (Last 24 hours) at 08/24/2021 0751 Last data filed at 08/24/2021 0600 Gross per 24 hour  Intake 1043.62 ml  Output 2775 ml  Net -1731.38 ml         Exam:  General:   NAD on 2 L [place] HEENT: MM pink/moist; Ada in place Neuro: Aox3; MAE CV: s1s2, RRR, no m/r/g PULM:  dim clear BS bilaterally; [place] 2 L [place] GI: soft, bsx4 active  Extremities: warm/dry, no edema; LUE has bleeding biopsy site with thrombi pad   Resolved Hospital Problem list   Hypotension    Assessment & Plan:    Acute on Chronic Hypoxemic and  Hypercapnic Respiratory Failure End Stage COPD, chronic home oxygen at 2 L [place], acute exacerbation  CAP P: -continue scheduled pulmicort, duoneb, and singulair -continue prednisone taper -educated to wear BiPAP at night due to increased risk of worsening hypercapnea if non compliant. Will have respiratory try a different size mask -Continue Lasix 40 daily -Pulm toilet: IS and OOB/PT/OT -trend I/O -continue to wean O2 for sats >90%; on 2L [place] at home -continue ceftriaxone x 5 days for CAP; (started 9/26)  Non pulmonary issues: CLL Bleeding derm biopsy site (from outpatient) P: -per primary  PCCM continue to follow for pulmonary needs   JD Geryl Rankins [facility] 08/24/2021, 8:35 AM  Please see [email or web address] for pager details.  From 7A-7P if no response, please call [phone]. After hours, please call ELink [phone].

## 2021-08-24 NOTE — Progress Notes (Signed)
Discharge instructions completed. Two clarifications noted, but unable to reprint AVS as below: Pt states he takes mucinex prn and vistaril 2xd All instructions give and understood by patient and wife, Drenda Freeze.

## 2021-08-24 NOTE — Telephone Encounter (Signed)
I called and spoke with patient regarding appt. Patient was just D/C home from hospital and is wanting to know if hospital f/u appt can be televisit or video visit as he has no way here. Aaron Edelman is not here today but will send him this message to see in the AM.  Aaron Edelman, please advise on appt at 930am. Thanks!

## 2021-08-24 NOTE — Telephone Encounter (Signed)
Patient was just released from the hospital and would like to know if he can do a televisit for his hospital follow up tomorrow at 9:30 with Aaron Edelman or if it has to be in person.  Patient will have to reschedule if in-person visit is needed.  Please advise.

## 2021-08-24 NOTE — Discharge Summary (Signed)
Physician Discharge Summary  Isaac [name] VZD:[id number] DOB: 01/19/1956 DOA: 08/21/2021  PCP: [facility]  Admit date: 08/21/2021 Discharge date: 08/24/2021  Time spent: 60 minutes  Recommendations for Outpatient Follow-up:  Follow-up PCP in 2 weeks Follow-up [facility] as outpatient   Discharge Diagnoses:  Principal Problem:   Acute on chronic respiratory failure with hypoxia and hypercapnia ([place]) Active Problems:   Obesity   COPD exacerbation ([place])   Acute-on-chronic respiratory failure (H[facility])   Chronic diastolic heart failure (H[facility])   CLL (chronic lymphocytic leukemia) ([place])   Respiratory arrest ([place])   Wound infection after surgery   Hypotension   Volume overload   Discharge Condition: Stable  Diet recommendation: Heart healthy diet  Filed Weights   08/22/21 0500 08/23/21 0500 08/24/21 0123  Weight: 98.7 kg 98.7 kg 97.2 kg    History of present illness:  65 year old male with history of chronic hypoxemic respiratory failure, chronic lymphocytic leukemia, COPD, hypertension, nephrolithiasis, anxiety, hyperlipidemia presented to the hospital with respiratory arrest at home.  History is not clear regarding circumstances which led to respiratory arrest.  EMS was called, he was provided bag mask ventilation to the ER.  By the time he came to the ER he regained consciousness. He was placed on BiPAP.     Hospital Course:   Acute on chronic hypoxemic/hypercapnic respiratory failure - resolved, at baseline -Patient has end-stage COPD on chronically home O2 at 2 L/min via [place] -Chest x-ray showed improving bibasilar aeration, indicating of improving edema or pneumonia -Improving with antibiotics, was started on ceftriaxone - will discharge home on po Zithromax 500 mg daily x  3 days -Continue prednisone taper as below -Continue Pulmicort, duo nebs, albuterol   History of CLL -WBC is elevated at 76.5 -He has chronically elevated WBC -Patient is on  ibrutinib; continued in the hospital -Followed by oncology as outpatient   Bleeding from biopsy site -Patient had lesion removed from left upper extremity as outpatient at dermatology office -Found to have bleeding from the biopsy site -Was seen by wound care RN - follow up PCP/dermatology as outpatient   ?  Acute on chronic diastolic CHF -Patient started on Lasix 40 mg IV daily -Net -4 L -Continue with Lasix at home dose -F   Hypertension -Likely rebound hypertension from not restarting Catapres and metoprolol -We will restart Catapres 0.1 mg patch q. 7 days -continue Metoprolol 25 mg po bid  Procedures:   Consultations:   Discharge Exam: Vitals:   08/24/21 0900 08/24/21 1000  BP: (!) 176/81 (!) 164/109  Pulse: 82 75  Resp: (!) 21 16  Temp:    SpO2: 97% 98%    General: Appears in no acute distress Cardiovascular: S1S2 RRR Respiratory: Clear bilaterally  Discharge Instructions   Discharge Instructions     Diet - low sodium heart healthy   Complete by: As directed    Increase activity slowly   Complete by: As directed    No wound care   Complete by: As directed       Allergies as of 08/24/2021       Reactions   Codeine Itching, Other (See Comments)   "jittery"   Prozac [fluoxetine Hcl] Other (See Comments)   confusion        Medication List     TAKE these medications    albuterol (2.5 MG/3ML) 0.083% nebulizer solution Commonly known as: PROVENTIL Take 3 mLs (2.5 mg total) by nebulization every 4 (four) hours as needed for wheezing or  shortness of breath. What changed: when to take this   albuterol 108 (90 Base) MCG/ACT inhaler Commonly known as: VENTOLIN HFA INHALE 2 PUFFS INTO THE LUNGS EVERY 4 (FOUR) HOURS AS NEEDED FOR WHEEZING OR SHORTNESS OF BREATH. What changed: when to take this   allopurinol 100 MG tablet Commonly known as: ZYLOPRIM Place 1 tablet (100 mg total) into feeding tube daily.   azithromycin 500 MG tablet Commonly  known as: Zithromax Take 1 tablet (500 mg total) by mouth daily for 3 days. Take 1 tablet daily for 3 days.   benzonatate 200 MG capsule Commonly known as: TESSALON Take 200 mg by mouth 3 (three) times daily as needed.   budesonide 0.5 MG/2ML nebulizer solution Commonly known as: PULMICORT TAKE 2 ML BY NEBULIZATION 2 TIMES DAILY.   cloNIDine 0.1 mg/24hr patch Commonly known as: CATAPRES - Dosed in mg/24 hr Place 0.1 mg onto the skin once a week. Tuesdays   docusate sodium 100 MG capsule Commonly known as: COLACE Take 100 mg by mouth daily.   Ensure Take 237 mLs by mouth daily. Chocolate flavor   furosemide 40 MG tablet Commonly known as: LASIX Take 40 mg by mouth daily.   guaiFENesin 600 MG 12 hr tablet Commonly known as: MUCINEX Take 600 mg by mouth daily.   hydrOXYzine 25 MG capsule Commonly known as: VISTARIL Take 50 mg by mouth at bedtime.   Imbruvica 420 MG Tabs Generic drug: ibrutinib Take 1 tablet (420 mg) by mouth daily.   ipratropium-albuterol 0.5-2.5 (3) MG/3ML Soln Commonly known as: DUONEB TAKE 3 MLS (ONE VIAL) BY NEBULIZATION EVERY 6 (SIX) HOURS AS NEEDED. What changed: See the new instructions.   loratadine 10 MG tablet Commonly known as: CLARITIN Take 10 mg by mouth daily.   metoprolol tartrate 25 MG tablet Commonly known as: LOPRESSOR Take 25 mg by mouth 2 (two) times daily.   montelukast 10 MG tablet Commonly known as: SINGULAIR Take 10 mg by mouth every evening.   pantoprazole 40 MG tablet Commonly known as: PROTONIX Take 40 mg by mouth at bedtime.   polyethylene glycol 17 g packet Commonly known as: MIRALAX / GLYCOLAX Place 17 g into feeding tube daily as needed for moderate constipation. What changed:  how to take this when to take this   predniSONE 10 MG tablet Commonly known as: DELTASONE Prednisone 40 mg po daily x 2 day then Prednisone 30 mg po daily x 3 day then Prednisone 20 mg po daily x 3 day then Prednisone 10 mg daily x  3 day then stop...   sodium chloride 0.65 % Soln nasal spray Commonly known as: OCEAN Place 2 sprays into both nostrils as needed for congestion. What changed: when to take this   VISINE OP Apply 1 drop to eye daily.       Allergies  Allergen Reactions   Codeine Itching and Other (See Comments)    "jittery"   Prozac [Fluoxetine Hcl] Other (See Comments)    confusion    Follow-up Information     [facility]. Go on 08/25/2021.   Specialty: Pulmonology Why: Please arrive to [facility] for appointment at 9:30 am with Wyn Quaker NP Contact information: [address] [place] [phone]                 The results of significant diagnostics from this hospitalization (including imaging, microbiology, ancillary and laboratory) are listed below for reference.    Significant  Diagnostic Studies: CT Angio Chest PE W and/or Wo Contrast  Result Date: 08/21/2021 CLINICAL DATA:  COPD, respiratory failure EXAM: CT ANGIOGRAPHY CHEST WITH CONTRAST TECHNIQUE: Multidetector CT imaging of the chest was performed using the standard protocol during bolus administration of intravenous contrast. Multiplanar CT image reconstructions and MIPs were obtained to evaluate the vascular anatomy. CONTRAST:  68mL OMNIPAQUE IOHEXOL 350 MG/ML SOLN COMPARISON:  03/04/2021 FINDINGS: Cardiovascular: No filling defects in the pulmonary arteries to suggest pulmonary emboli. Heart is mildly enlarged. Scattered coronary artery and aortic calcifications. No aneurysm. Mediastinum/Nodes: Previously seen lower cervical, axillary axillary and mediastinal adenopathy improved since prior study. Left axillary lymph node has a short axis diameter of 13 mm on image 76. Right axillary lymph node has a short axis diameter of 8 mm on image 67. This compares to 2.3 cm previously. Subcarinal adenopathy has a short axis diameter of 1.5 cm compared to 2.2 cm previously.  Right paratracheal lymph node has a short axis diameter of 7 mm compared to 12 mm previously. Lungs/Pleura: Emphysematous changes throughout the lungs. Ground-glass opacities in the upper lobes bilaterally, similar to prior study. Ground-glass opacities also noted in the right middle lobe and right lower lobe, similar prior study. Consolidation now noted in the right middle lobe and lingula concerning for pneumonia, new since prior study. Small bilateral pleural effusions. Upper Abdomen: Stable cyst in the left hepatic lobe. No acute findings. Musculoskeletal: Chest wall soft tissues are unremarkable. No acute bony abnormality. Review of the MIP images confirms the above findings. IMPRESSION: No evidence of pulmonary embolus. Bilateral axillary and mediastinal adenopathy improved since prior study. Areas of ground-glass opacities in the upper lobes bilaterally as well as right middle lobe and right lower lobe are stable since prior study. New areas of consolidation in the right middle lobe and lingula concerning for pneumonia. Small bilateral pleural effusions. Cardiomegaly, coronary artery disease. Aortic Atherosclerosis (ICD10-I70.0) and Emphysema (ICD10-J43.9). Electronically Signed   By: Rolm Baptise M.D.   On: 08/21/2021 03:00   DG CHEST PORT 1 VIEW  Result Date: 08/23/2021 CLINICAL DATA:  Respiratory distress. EXAM: PORTABLE CHEST 1 VIEW COMPARISON:  08/22/2021 and CT chest 08/21/2021. FINDINGS: Trachea is midline. Heart size stable. Mild residual hazy opacification in the lung bases, right greater than left. No definite pleural fluid. Bone island in the left fifth posterior rib. IMPRESSION: Improving bibasilar aeration, indicative of improving edema or pneumonia. Electronically Signed   By: Lorin Picket M.D.   On: 08/23/2021 09:17   DG Chest Port 1 View  Result Date: 08/22/2021 CLINICAL DATA:  Pulmonary edema. EXAM: PORTABLE CHEST 1 VIEW COMPARISON:  08/21/2021 FINDINGS: Single view of the chest  again demonstrates patchy bilateral airspace disease most prominent in the lower lungs. Heart size is upper limits of normal and stable. Negative for a pneumothorax. Again noted is focal sclerosis involving the posterior left fifth rib. Negative for a pneumothorax. IMPRESSION: No significant change in the patchy bilateral airspace disease. Electronically Signed   By: Markus Daft M.D.   On: 08/22/2021 07:55   DG Chest Port 1 View  Result Date: 08/21/2021 CLINICAL DATA:  Shortness of breath EXAM: PORTABLE CHEST 1 VIEW COMPARISON:  06/05/2021 FINDINGS: Cardiomegaly, vascular congestion. Interstitial prominence may reflect interstitial edema. Peripheral lucency in the left lung is felt to be artifactual as lung markings continue beyond this lucency. No effusions or acute bony abnormality. IMPRESSION: Cardiomegaly, vascular congestion. Possible mild interstitial edema. Electronically Signed   By: Rolm Baptise M.D.  On: 08/21/2021 01:11    Microbiology: Recent Results (from the past 240 hour(s))  Resp Panel by RT-PCR (Flu A&B, Covid)     Status: None   Collection Time: 08/21/21  3:50 AM  Result Value Ref Range Status   SARS Coronavirus 2 by RT PCR NEGATIVE NEGATIVE Final    Comment: (NOTE) SARS-CoV-2 target nucleic acids are NOT DETECTED.  The SARS-CoV-2 RNA is generally detectable in upper respiratory specimens during the acute phase of infection. The lowest concentration of SARS-CoV-2 viral copies this assay can detect is 138 copies/mL. A negative result does not preclude SARS-Cov-2 infection and should not be used as the sole basis for treatment or other patient management decisions. A negative result may occur with  improper specimen collection/handling, submission of specimen other than nasopharyngeal swab, presence of viral mutation(s) within the areas targeted by this assay, and inadequate number of viral copies(<138 copies/mL). A negative result must be combined with clinical observations,  patient history, and epidemiological information. The expected result is Negative.  Fact Sheet for Patients:  EntrepreneurPulse.com.au  Fact Sheet for Healthcare Providers:  IncredibleEmployment.be  This test is no t yet approved or cleared by the [place] FDA and  has been authorized for detection and/or diagnosis of SARS-CoV-2 by FDA under an Emergency Use Authorization (EUA). This EUA will remain  in effect (meaning this test can be used) for the duration of the COVID-19 declaration under Section 564(b)(1) of the Act, 21 U.S.C.section 360bbb-3(b)(1), unless the authorization is terminated  or revoked sooner.       Influenza A by PCR NEGATIVE NEGATIVE Final   Influenza B by PCR NEGATIVE NEGATIVE Final    Comment: (NOTE) The Xpert Xpress SARS-CoV-2/FLU/RSV plus assay is intended as an aid in the diagnosis of influenza from Nasopharyngeal swab specimens and should not be used as a sole basis for treatment. Nasal washings and aspirates are unacceptable for Xpert Xpress SARS-CoV-2/FLU/RSV testing.  Fact Sheet for Patients: EntrepreneurPulse.com.au  Fact Sheet for Healthcare Providers: IncredibleEmployment.be  This test is not yet approved or cleared by the [place] FDA and has been authorized for detection and/or diagnosis of SARS-CoV-2 by FDA under an Emergency Use Authorization (EUA). This EUA will remain in effect (meaning this test can be used) for the duration of the COVID-19 declaration under Section 564(b)(1) of the Act, 21 U.S.C. section 360bbb-3(b)(1), unless the authorization is terminated or revoked.  Performed at [facility], [place] [address], [place], [place] [address]   Culture, blood (routine x 2)     Status: None (Preliminary result)   Collection Time: 08/21/21  5:08 AM   Specimen: BLOOD RIGHT FOREARM  Result Value Ref Range Status   Specimen Description BLOOD RIGHT  FOREARM  Final   Special Requests   Final    BOTTLES DRAWN AEROBIC AND ANAEROBIC Blood Culture adequate volume   Culture   Final    NO GROWTH 2 DAYS Performed at [facility], [place] [address], [place], [place] [address]    Report Status PENDING  Incomplete  Culture, blood (routine x 2)     Status: Abnormal   Collection Time: 08/21/21  5:10 AM   Specimen: BLOOD LEFT FOREARM  Result Value Ref Range Status   Specimen Description BLOOD LEFT FOREARM  Final   Special Requests   Final    BOTTLES DRAWN AEROBIC AND ANAEROBIC Blood Culture adequate volume   Culture  Setup Time   Final    NO ORGANISMS SEEN REINCUBATED  BOTTLE ANA 08/21/21@23 :13 BY TW GRAM POSITIVE COCCI AEROBIC BOTTLE ONLY CRITICAL RESULT CALLED TO, READ BACK BY AND VERIFIED WITH: PHARMD LEDFORD 08/22/21@2 :39 BY TW    Culture (A)  Final    STAPHYLOCOCCUS EPIDERMIDIS THE SIGNIFICANCE OF ISOLATING THIS ORGANISM FROM A SINGLE SET OF BLOOD CULTURES WHEN MULTIPLE SETS ARE DRAWN IS UNCERTAIN. PLEASE NOTIFY THE MICROBIOLOGY DEPARTMENT WITHIN ONE WEEK IF SPECIATION AND SENSITIVITIES ARE REQUIRED.    Report Status 08/23/2021 FINAL  Final  Blood Culture ID Panel (Reflexed)     Status: Abnormal   Collection Time: 08/21/21  5:10 AM  Result Value Ref Range Status   Enterococcus faecalis NOT DETECTED NOT DETECTED Final   Enterococcus Faecium NOT DETECTED NOT DETECTED Final   Listeria monocytogenes NOT DETECTED NOT DETECTED Final   Staphylococcus species DETECTED (A) NOT DETECTED Final    Comment: CRITICAL RESULT CALLED TO, READ BACK BY AND VERIFIED WITH: PHARMD LEDFORD 08/22/21@2 :39 BY TW    Staphylococcus aureus (BCID) NOT DETECTED NOT DETECTED Final   Staphylococcus epidermidis DETECTED (A) NOT DETECTED Final    Comment: Methicillin (oxacillin) resistant coagulase negative staphylococcus. Possible blood culture contaminant (unless isolated from more than one blood culture draw or clinical case suggests pathogenicity). No antibiotic  treatment is indicated for blood  culture contaminants. CRITICAL RESULT CALLED TO, READ BACK BY AND VERIFIED WITH: PHARMD LEDFORD 08/22/21@2 :39 BY TW    Staphylococcus lugdunensis NOT DETECTED NOT DETECTED Final   Streptococcus species NOT DETECTED NOT DETECTED Final   Streptococcus agalactiae NOT DETECTED NOT DETECTED Final   Streptococcus pneumoniae NOT DETECTED NOT DETECTED Final   Streptococcus pyogenes NOT DETECTED NOT DETECTED Final   A.calcoaceticus-baumannii NOT DETECTED NOT DETECTED Final   Bacteroides fragilis NOT DETECTED NOT DETECTED Final   Enterobacterales NOT DETECTED NOT DETECTED Final   Enterobacter cloacae complex NOT DETECTED NOT DETECTED Final   Escherichia coli NOT DETECTED NOT DETECTED Final   Klebsiella aerogenes NOT DETECTED NOT DETECTED Final   Klebsiella oxytoca NOT DETECTED NOT DETECTED Final   Klebsiella pneumoniae NOT DETECTED NOT DETECTED Final   Proteus species NOT DETECTED NOT DETECTED Final   Salmonella species NOT DETECTED NOT DETECTED Final   Serratia marcescens NOT DETECTED NOT DETECTED Final   Haemophilus influenzae NOT DETECTED NOT DETECTED Final   Neisseria meningitidis NOT DETECTED NOT DETECTED Final   Pseudomonas aeruginosa NOT DETECTED NOT DETECTED Final   Stenotrophomonas maltophilia NOT DETECTED NOT DETECTED Final   Candida albicans NOT DETECTED NOT DETECTED Final   Candida auris NOT DETECTED NOT DETECTED Final   Candida glabrata NOT DETECTED NOT DETECTED Final   Candida krusei NOT DETECTED NOT DETECTED Final   Candida parapsilosis NOT DETECTED NOT DETECTED Final   Candida tropicalis NOT DETECTED NOT DETECTED Final   Cryptococcus neoformans/gattii NOT DETECTED NOT DETECTED Final   Methicillin resistance mecA/C DETECTED (A) NOT DETECTED Final    Comment: CRITICAL RESULT CALLED TO, READ BACK BY AND VERIFIED WITH: PHARMD LEDFORD 08/22/21@2 :39 BY TW Performed at [facility], [address], [place], [place] [address]   MRSA Next Gen by  PCR, Nasal     Status: None   Collection Time: 08/21/21  6:55 AM   Specimen: Nasal Mucosa; Nasal Swab  Result Value Ref Range Status   MRSA by PCR Next Gen NOT DETECTED NOT DETECTED Final    Comment: (NOTE) The GeneXpert MRSA Assay (FDA approved for NASAL specimens only), is one component of a comprehensive MRSA colonization surveillance program. It is not intended to diagnose  MRSA infection nor to guide or monitor treatment for MRSA infections. Test performance is not FDA approved in patients less than 40 years old. Performed at [facility], [place] [address], [place], [place] [address]      Labs: Basic Metabolic Panel: Recent Labs  Lab 08/21/21 0057 08/21/21 0110 08/21/21 0352 08/21/21 0504 08/21/21 0508 08/23/21 0320 08/24/21 0322  NA 140   < > 138 140 138 137 139  K 4.9   < > 5.0 4.8 4.8 4.7 4.2  CL 100  --   --  96*  --  97* 99  CO2 33*  --   --  35*  --  30 33*  GLUCOSE 272*  --   --  217*  --  193* 100*  BUN 13  --   --  15  --  34* 25*  CREATININE 0.96  --   --  1.09  --  1.09 1.00  CALCIUM 8.6*  --   --  9.0  --  8.8* 8.9  MG  --   --   --  2.8*  --  2.3  --    < > = values in this interval not displayed.   Liver Function Tests: Recent Labs  Lab 08/21/21 0057 08/23/21 0320 08/24/21 0322  AST 16 12* 12*  ALT 8 10 9   ALKPHOS 92 67 62  BILITOT 0.6 0.4 0.6  PROT 5.8* 5.3* 5.4*  ALBUMIN 3.1* 3.1* 3.1*   No results for input(s): LIPASE, AMYLASE in the last 168 hours. No results for input(s): AMMONIA in the last 168 hours. CBC: Recent Labs  Lab 08/21/21 0057 08/21/21 0110 08/21/21 0352 08/21/21 0504 08/21/21 0508 08/22/21 1127 08/24/21 0322  WBC 119.2*  --   --  128.3*  --  82.4* 76.5*  NEUTROABS  --   --   --   --   --  20.6*  --   HGB 12.6*   < > 12.9* 12.4* 12.9* 10.7* 11.1*  HCT 44.5   < > 38.0* 43.7 38.0* 36.4* 37.3*  MCV 90.1  --   --  90.1  --  86.9 86.5  PLT 261  --   --  279  --  210 185   < > = values in this interval not displayed.    Cardiac Enzymes: No results for input(s): CKTOTAL, CKMB, CKMBINDEX, TROPONINI in the last 168 hours. BNP: BNP (last 3 results) Recent Labs    12/08/20 0100 03/04/21 1555 08/21/21 0504  BNP 146.1* 93.2 142.9*    ProBNP (last 3 results) No results for input(s): PROBNP in the last 8760 hours.  CBG: Recent Labs  Lab 08/23/21 1128 08/23/21 1507 08/23/21 2226 08/23/21 2321 08/24/21 0805  GLUCAP 156* 159* 188* 155* 112*       Signed:  Oswald Hillock MD.  [facility] 08/24/2021, 10:53 AM

## 2021-08-25 ENCOUNTER — Other Ambulatory Visit: Payer: Self-pay

## 2021-08-25 ENCOUNTER — Encounter: Payer: Self-pay | Admitting: Pulmonary Disease

## 2021-08-25 ENCOUNTER — Ambulatory Visit (INDEPENDENT_AMBULATORY_CARE_PROVIDER_SITE_OTHER): Payer: BC Managed Care – PPO | Admitting: Pulmonary Disease

## 2021-08-25 DIAGNOSIS — D630 Anemia in neoplastic disease: Secondary | ICD-10-CM | POA: Diagnosis not present

## 2021-08-25 DIAGNOSIS — C9111 Chronic lymphocytic leukemia of B-cell type in remission: Secondary | ICD-10-CM

## 2021-08-25 DIAGNOSIS — Z0000 Encounter for general adult medical examination without abnormal findings: Secondary | ICD-10-CM

## 2021-08-25 DIAGNOSIS — I130 Hypertensive heart and chronic kidney disease with heart failure and stage 1 through stage 4 chronic kidney disease, or unspecified chronic kidney disease: Secondary | ICD-10-CM | POA: Diagnosis not present

## 2021-08-25 DIAGNOSIS — F419 Anxiety disorder, unspecified: Secondary | ICD-10-CM | POA: Diagnosis not present

## 2021-08-25 DIAGNOSIS — E669 Obesity, unspecified: Secondary | ICD-10-CM | POA: Diagnosis not present

## 2021-08-25 DIAGNOSIS — M160 Bilateral primary osteoarthritis of hip: Secondary | ICD-10-CM | POA: Diagnosis not present

## 2021-08-25 DIAGNOSIS — I5032 Chronic diastolic (congestive) heart failure: Secondary | ICD-10-CM | POA: Diagnosis not present

## 2021-08-25 DIAGNOSIS — F32A Depression, unspecified: Secondary | ICD-10-CM | POA: Diagnosis not present

## 2021-08-25 DIAGNOSIS — J9611 Chronic respiratory failure with hypoxia: Secondary | ICD-10-CM | POA: Diagnosis not present

## 2021-08-25 DIAGNOSIS — J9612 Chronic respiratory failure with hypercapnia: Secondary | ICD-10-CM | POA: Diagnosis not present

## 2021-08-25 DIAGNOSIS — D631 Anemia in chronic kidney disease: Secondary | ICD-10-CM | POA: Diagnosis not present

## 2021-08-25 DIAGNOSIS — G4730 Sleep apnea, unspecified: Secondary | ICD-10-CM | POA: Diagnosis not present

## 2021-08-25 DIAGNOSIS — M170 Bilateral primary osteoarthritis of knee: Secondary | ICD-10-CM | POA: Diagnosis not present

## 2021-08-25 DIAGNOSIS — G4733 Obstructive sleep apnea (adult) (pediatric): Secondary | ICD-10-CM | POA: Diagnosis not present

## 2021-08-25 DIAGNOSIS — H6122 Impacted cerumen, left ear: Secondary | ICD-10-CM | POA: Diagnosis not present

## 2021-08-25 DIAGNOSIS — J449 Chronic obstructive pulmonary disease, unspecified: Secondary | ICD-10-CM

## 2021-08-25 DIAGNOSIS — D509 Iron deficiency anemia, unspecified: Secondary | ICD-10-CM | POA: Diagnosis not present

## 2021-08-25 DIAGNOSIS — N189 Chronic kidney disease, unspecified: Secondary | ICD-10-CM | POA: Diagnosis not present

## 2021-08-25 NOTE — Telephone Encounter (Signed)
Isaac [name] did approve for visit to be a televisit. Patient informed and checked in.

## 2021-08-25 NOTE — Assessment & Plan Note (Signed)
Plan: Keep follow-up with oncology in outpatient setting

## 2021-08-25 NOTE — Progress Notes (Signed)
Virtual Visit via Telephone Note  I connected with Isaac [name] on 08/25/21 at  9:30 AM EDT by telephone and verified that I am speaking with the correct person using two identifiers.  Location: Patient: Home Provider: [facility] - [address], [place], [place], [place] [address]   I discussed the limitations, risks, security and privacy concerns of performing an evaluation and management service by telephone and the availability of in person appointments. I also discussed with the patient that there may be a patient responsible charge related to this service. The patient expressed understanding and agreed to proceed.  Patient consented to consult via telephone: Yes People present and their role in pt care: Pt   History of Present Illness:  65 year old male former smoker followed in our office for COPD and chronic respiratory failure  Past medical history: Obesity, hypertension, chronic diastolic heart failure, acute kidney injury, chronic lymphocytic leukemia Smoking history: Former smoker.  Quit 2010.  35-pack-year smoking history Maintenance: Pulmicort, DuoNebs Patient of Dr. Chase Caller  Chief complaint: Hospital follow-up   65 year old male former smoker fonder office for COPD and chronic respiratory failure.  Patient completing hospital follow-up today.  Due to weather patient requested that this be converted to a telephone visit.  Patient was hospitalized on 08/21/2021 and discharged on 08/24/2021.  Summary of discharge listed below:  Admit date: 08/21/2021 Discharge date: 08/24/2021   Time spent: 60 minutes   Recommendations for Outpatient Follow-up:  Follow-up PCP in 2 weeks Follow-up [facility] as outpatient     Discharge Diagnoses:  Principal Problem:   Acute on chronic respiratory failure with hypoxia and hypercapnia ([place]) Active Problems:   Obesity   COPD exacerbation ([place])   Acute-on-chronic respiratory failure (HCC)   Chronic diastolic heart  failure (HCC)   CLL (chronic lymphocytic leukemia) ([place])   Respiratory arrest ([place])   Wound infection after surgery   Hypotension   Volume overload     Discharge Condition: Stable   Diet recommendation: Heart healthy diet        Filed Weights    08/22/21 0500 08/23/21 0500 08/24/21 0123  Weight: 98.7 kg 98.7 kg 97.2 kg      History of present illness:  65 year old male with history of chronic hypoxemic respiratory failure, chronic lymphocytic leukemia, COPD, hypertension, nephrolithiasis, anxiety, hyperlipidemia presented to the hospital with respiratory arrest at home.  History is not clear regarding circumstances which led to respiratory arrest.  EMS was called, he was provided bag mask ventilation to the ER.  By the time he came to the ER he regained consciousness. He was placed on BiPAP.       Hospital Course:    Acute on chronic hypoxemic/hypercapnic respiratory failure - resolved, at baseline -Patient has end-stage COPD on chronically home O2 at 2 L/min via [place] -Chest x-ray showed improving bibasilar aeration, indicating of improving edema or pneumonia -Improving with antibiotics, was started on ceftriaxone - will discharge home on po Zithromax 500 mg daily x  3 days -Continue prednisone taper as below -Continue Pulmicort, duo nebs, albuterol   History of CLL -WBC is elevated at 76.5 -He has chronically elevated WBC -Patient is on ibrutinib; continued in the hospital -Followed by oncology as outpatient   Bleeding from biopsy site -Patient had lesion removed from left upper extremity as outpatient at dermatology office -[name] to have bleeding from the biopsy site -Was seen by wound care RN - follow up PCP/dermatology as outpatient   ?  Acute on  chronic diastolic CHF -Patient started on Lasix 40 mg IV daily -Net -4 L -Continue with Lasix at home dose -F   Hypertension -Likely rebound hypertension from not restarting Catapres and metoprolol -We will restart  Catapres 0.1 mg patch q. 7 days -continue Metoprolol 25 mg po bid    Patient was treated with antibiotics and given 3 more days of azithromycin.  Continued on prednisone taper.  Continue on Pulmicort and duo nebs and 2 L of O2.  Patient also followed by oncology and outpatient.  Patient reporting that he is "panting and not breathing well".  Patient reports that he is taking his Pulmicort and duo nebs as scheduled.  He wishes that his nebulizer at home would be quicker like the 1 in the hospital.  He is currently having issues with his adjustable bed.  He does not have primary care follow-up yet.  He is also established with palliative care.  Patient and spouse are concerned regarding his CO2 levels.  We will discuss this today.  Patient's last sleep study was in 2015.  Patient is starting outpatient prednisone taper today from hospital discharge as well as azithromycin.   Observations/Objective:  Social History   Tobacco Use  Smoking Status Former   Packs/day: 1.00   Years: 35.00   Pack years: 35.00   Types: Cigarettes   Quit date: 12/07/2008   Years since quitting: 12.7  Smokeless Tobacco Never   Immunization History  Administered Date(s) Administered   Influenza Split 09/27/2011, 08/20/2012, 07/27/2013   Influenza, High Dose Seasonal PF 09/05/2018   Influenza, Quadrivalent, Recombinant, Inj, Pf 09/25/2019   Influenza,inj,Quad PF,6+ Mos 08/22/2015, 10/26/2016, 01/07/2018, 08/12/2020   Influenza-Unspecified 12/24/2011, 09/06/2012, 10/19/2013, 08/31/2015, 08/15/2016, 10/12/2019   PFIZER(Purple Top)SARS-COV-2 Vaccination 03/23/2020, 04/13/2020   Pneumococcal Conjugate-13 08/22/2015, 03/22/2021   Pneumococcal Polysaccharide-23 07/23/2012, 09/06/2012   Tdap 10/01/2012    Assessment and Plan:  Sleep apnea Plan: Established with sleep MD in office Consider repeat sleep study Patient may benefit from CPAP or BiPAP given hypercapnia  Chronic respiratory failure with  hypercapnia Plan: Continue Pulmicort nebs Continue DuoNebs Continue Lasix Start weighing yourself to monitor for fluid retention Set up follow-up with primary care Continue oxygen therapy Close follow-up with our office and consider outpatient BiPAP start given hypercapnia  COPD, very severe ([place]) Plan: Continue Pulmicort nebulized meds Continue DuoNebs Continue oxygen therapy Consider flu vaccine at next office visit Consider outpatient BiPAP start given hypercapnia Reviewed with spouse as well as patient to monitor for signs of hypercapnia  Chronic lymphocytic leukemia in remission [facility]) Plan: Keep follow-up with oncology in outpatient setting  Healthcare maintenance Plan: Contact primary care office today and schedule hospital follow-up Consider flu vaccine at next in office visit   Follow Up Instructions:  Return in about 2 weeks (around 09/08/2021), or if symptoms worsen or fail to improve, for Sleep Consult - 55min slot.   If patient would like to remain with Dr. Chase Caller for pulmonary management please also ensure patient has follow-up with Dr. Chase Caller within the next 6 to 8 weeks.  I discussed the assessment and treatment plan with the patient. The patient was provided an opportunity to ask questions and all were answered. The patient agreed with the plan and demonstrated an understanding of the instructions.   The patient was advised to call back or seek an in-person evaluation if the symptoms worsen or if the condition fails to improve as anticipated.  I provided 26 minutes of non-face-to-face time during this encounter.  Lauraine Rinne, NP

## 2021-08-25 NOTE — Assessment & Plan Note (Signed)
Plan: Established with sleep MD in office Consider repeat sleep study Patient may benefit from CPAP or BiPAP given hypercapnia

## 2021-08-25 NOTE — Assessment & Plan Note (Signed)
Plan: Continue Pulmicort nebulized meds Continue DuoNebs Continue oxygen therapy Consider flu vaccine at next office visit Consider outpatient BiPAP start given hypercapnia Reviewed with spouse as well as patient to monitor for signs of hypercapnia

## 2021-08-25 NOTE — Patient Instructions (Addendum)
You were seen today by Lauraine Rinne, NP  for:   1. COPD, very severe ([place])  Continue prednisone as outlined with hospital discharge  Continue azithromycin as outlined by hospital discharge  Continue Pulmicort nebs twice daily  Continue DuoNebs every 6 hours  Continue oxygen therapy  2. Chronic respiratory failure with hypercapnia (HCC)  Continue oxygen therapy  Close follow-up with our office and we can consider outpatient BiPAP start given your elevated CO2 levels  3. Chronic lymphocytic leukemia in remission [facility])  Keep outpatient follow-up with oncology  4. Sleep apnea, unspecified type  May need to consider outpatient sleep study to rule out sleep apnea  Close follow-up with our office to establish with sleep MD  5. Healthcare maintenance  Contact primary care and schedule a hospital follow-up  Consider flu vaccine at next in office visit   Follow Up:    Return in about 2 weeks (around 09/08/2021), or if symptoms worsen or fail to improve, for Sleep Consult - 94min slot.  If patient would like to remain with Dr. Chase Caller for pulmonary management please also ensure patient has follow-up with Dr. Chase Caller within the next 6 to 8 weeks.  Notification of test results are managed in the following manner: If there are  any recommendations or changes to the  plan of care discussed in office today,  we will contact you and let you know what they are. If you do not hear from [place], then your results are normal and you can view them through your  MyChart account , or a letter will be sent to you. Thank you again for trusting [place] with your care  - Thank you, [facility]    It is flu season:   >>> Best ways to protect herself from the flu: Receive the yearly flu vaccine, practice good hand hygiene washing with soap and also using hand sanitizer when available, eat a nutritious meals, get adequate rest, hydrate appropriately       Please contact the office if your  symptoms worsen or you have concerns that you are not improving.   Thank you for choosing [facility] for your healthcare, and for allowing [place] to partner with you on your healthcare journey. I am thankful to be able to provide care to you today.   Wyn Quaker FNP-C

## 2021-08-25 NOTE — Assessment & Plan Note (Signed)
Plan: Contact primary care office today and schedule hospital follow-up Consider flu vaccine at next in office visit

## 2021-08-25 NOTE — Assessment & Plan Note (Signed)
Plan: Continue Pulmicort nebs Continue DuoNebs Continue Lasix Start weighing yourself to monitor for fluid retention Set up follow-up with primary care Continue oxygen therapy Close follow-up with our office and consider outpatient BiPAP start given hypercapnia

## 2021-08-26 LAB — CULTURE, BLOOD (ROUTINE X 2)
Culture: NO GROWTH
Special Requests: ADEQUATE

## 2021-08-29 DIAGNOSIS — I5032 Chronic diastolic (congestive) heart failure: Secondary | ICD-10-CM | POA: Diagnosis not present

## 2021-08-29 DIAGNOSIS — M792 Neuralgia and neuritis, unspecified: Secondary | ICD-10-CM | POA: Diagnosis not present

## 2021-08-29 DIAGNOSIS — F419 Anxiety disorder, unspecified: Secondary | ICD-10-CM | POA: Diagnosis not present

## 2021-08-29 DIAGNOSIS — I10 Essential (primary) hypertension: Secondary | ICD-10-CM | POA: Diagnosis not present

## 2021-08-29 DIAGNOSIS — J9621 Acute and chronic respiratory failure with hypoxia: Secondary | ICD-10-CM | POA: Diagnosis not present

## 2021-08-29 MED FILL — LORAZEPAM 0.5 MG TABLET: 30 days supply | Qty: 60 | Fill #0

## 2021-08-29 MED FILL — GABAPENTIN 100 MG CAPSULE: 30 days supply | Qty: 30 | Fill #0

## 2021-08-30 ENCOUNTER — Other Ambulatory Visit (HOSPITAL_COMMUNITY): Payer: Self-pay

## 2021-08-30 DIAGNOSIS — D509 Iron deficiency anemia, unspecified: Secondary | ICD-10-CM | POA: Diagnosis not present

## 2021-08-30 DIAGNOSIS — E669 Obesity, unspecified: Secondary | ICD-10-CM | POA: Diagnosis not present

## 2021-08-30 DIAGNOSIS — I130 Hypertensive heart and chronic kidney disease with heart failure and stage 1 through stage 4 chronic kidney disease, or unspecified chronic kidney disease: Secondary | ICD-10-CM | POA: Diagnosis not present

## 2021-08-30 DIAGNOSIS — D631 Anemia in chronic kidney disease: Secondary | ICD-10-CM | POA: Diagnosis not present

## 2021-08-30 DIAGNOSIS — J449 Chronic obstructive pulmonary disease, unspecified: Secondary | ICD-10-CM | POA: Diagnosis not present

## 2021-08-30 DIAGNOSIS — M160 Bilateral primary osteoarthritis of hip: Secondary | ICD-10-CM | POA: Diagnosis not present

## 2021-08-30 DIAGNOSIS — N189 Chronic kidney disease, unspecified: Secondary | ICD-10-CM | POA: Diagnosis not present

## 2021-08-30 DIAGNOSIS — G4733 Obstructive sleep apnea (adult) (pediatric): Secondary | ICD-10-CM | POA: Diagnosis not present

## 2021-08-30 DIAGNOSIS — I5032 Chronic diastolic (congestive) heart failure: Secondary | ICD-10-CM | POA: Diagnosis not present

## 2021-08-30 DIAGNOSIS — C9111 Chronic lymphocytic leukemia of B-cell type in remission: Secondary | ICD-10-CM | POA: Diagnosis not present

## 2021-08-30 DIAGNOSIS — F32A Depression, unspecified: Secondary | ICD-10-CM | POA: Diagnosis not present

## 2021-08-30 DIAGNOSIS — M170 Bilateral primary osteoarthritis of knee: Secondary | ICD-10-CM | POA: Diagnosis not present

## 2021-08-30 DIAGNOSIS — J9611 Chronic respiratory failure with hypoxia: Secondary | ICD-10-CM | POA: Diagnosis not present

## 2021-08-30 DIAGNOSIS — D630 Anemia in neoplastic disease: Secondary | ICD-10-CM | POA: Diagnosis not present

## 2021-08-30 DIAGNOSIS — H6122 Impacted cerumen, left ear: Secondary | ICD-10-CM | POA: Diagnosis not present

## 2021-08-30 DIAGNOSIS — F419 Anxiety disorder, unspecified: Secondary | ICD-10-CM | POA: Diagnosis not present

## 2021-08-30 MED FILL — IMBRUVICA 420 MG TABS: 28 days supply | Qty: 28 | Fill #1

## 2021-09-01 DIAGNOSIS — G4733 Obstructive sleep apnea (adult) (pediatric): Secondary | ICD-10-CM | POA: Diagnosis not present

## 2021-09-01 DIAGNOSIS — D509 Iron deficiency anemia, unspecified: Secondary | ICD-10-CM | POA: Diagnosis not present

## 2021-09-01 DIAGNOSIS — M170 Bilateral primary osteoarthritis of knee: Secondary | ICD-10-CM | POA: Diagnosis not present

## 2021-09-01 DIAGNOSIS — J9611 Chronic respiratory failure with hypoxia: Secondary | ICD-10-CM | POA: Diagnosis not present

## 2021-09-01 DIAGNOSIS — C9111 Chronic lymphocytic leukemia of B-cell type in remission: Secondary | ICD-10-CM | POA: Diagnosis not present

## 2021-09-01 DIAGNOSIS — J449 Chronic obstructive pulmonary disease, unspecified: Secondary | ICD-10-CM | POA: Diagnosis not present

## 2021-09-01 DIAGNOSIS — M160 Bilateral primary osteoarthritis of hip: Secondary | ICD-10-CM | POA: Diagnosis not present

## 2021-09-01 DIAGNOSIS — F419 Anxiety disorder, unspecified: Secondary | ICD-10-CM | POA: Diagnosis not present

## 2021-09-01 DIAGNOSIS — D630 Anemia in neoplastic disease: Secondary | ICD-10-CM | POA: Diagnosis not present

## 2021-09-01 DIAGNOSIS — H6122 Impacted cerumen, left ear: Secondary | ICD-10-CM | POA: Diagnosis not present

## 2021-09-01 DIAGNOSIS — E669 Obesity, unspecified: Secondary | ICD-10-CM | POA: Diagnosis not present

## 2021-09-01 DIAGNOSIS — D631 Anemia in chronic kidney disease: Secondary | ICD-10-CM | POA: Diagnosis not present

## 2021-09-01 DIAGNOSIS — F32A Depression, unspecified: Secondary | ICD-10-CM | POA: Diagnosis not present

## 2021-09-01 DIAGNOSIS — N189 Chronic kidney disease, unspecified: Secondary | ICD-10-CM | POA: Diagnosis not present

## 2021-09-01 DIAGNOSIS — I5032 Chronic diastolic (congestive) heart failure: Secondary | ICD-10-CM | POA: Diagnosis not present

## 2021-09-01 DIAGNOSIS — I130 Hypertensive heart and chronic kidney disease with heart failure and stage 1 through stage 4 chronic kidney disease, or unspecified chronic kidney disease: Secondary | ICD-10-CM | POA: Diagnosis not present

## 2021-09-02 DIAGNOSIS — J449 Chronic obstructive pulmonary disease, unspecified: Secondary | ICD-10-CM | POA: Diagnosis not present

## 2021-09-02 DIAGNOSIS — J9620 Acute and chronic respiratory failure, unspecified whether with hypoxia or hypercapnia: Secondary | ICD-10-CM | POA: Diagnosis not present

## 2021-09-04 DIAGNOSIS — J9620 Acute and chronic respiratory failure, unspecified whether with hypoxia or hypercapnia: Secondary | ICD-10-CM | POA: Diagnosis not present

## 2021-09-04 DIAGNOSIS — J449 Chronic obstructive pulmonary disease, unspecified: Secondary | ICD-10-CM | POA: Diagnosis not present

## 2021-09-05 DIAGNOSIS — G4733 Obstructive sleep apnea (adult) (pediatric): Secondary | ICD-10-CM | POA: Diagnosis not present

## 2021-09-05 DIAGNOSIS — M170 Bilateral primary osteoarthritis of knee: Secondary | ICD-10-CM | POA: Diagnosis not present

## 2021-09-05 DIAGNOSIS — F32A Depression, unspecified: Secondary | ICD-10-CM | POA: Diagnosis not present

## 2021-09-05 DIAGNOSIS — N189 Chronic kidney disease, unspecified: Secondary | ICD-10-CM | POA: Diagnosis not present

## 2021-09-05 DIAGNOSIS — F419 Anxiety disorder, unspecified: Secondary | ICD-10-CM | POA: Diagnosis not present

## 2021-09-05 DIAGNOSIS — D631 Anemia in chronic kidney disease: Secondary | ICD-10-CM | POA: Diagnosis not present

## 2021-09-05 DIAGNOSIS — D509 Iron deficiency anemia, unspecified: Secondary | ICD-10-CM | POA: Diagnosis not present

## 2021-09-05 DIAGNOSIS — I5032 Chronic diastolic (congestive) heart failure: Secondary | ICD-10-CM | POA: Diagnosis not present

## 2021-09-05 DIAGNOSIS — C9111 Chronic lymphocytic leukemia of B-cell type in remission: Secondary | ICD-10-CM | POA: Diagnosis not present

## 2021-09-05 DIAGNOSIS — H6122 Impacted cerumen, left ear: Secondary | ICD-10-CM | POA: Diagnosis not present

## 2021-09-05 DIAGNOSIS — J449 Chronic obstructive pulmonary disease, unspecified: Secondary | ICD-10-CM | POA: Diagnosis not present

## 2021-09-05 DIAGNOSIS — M160 Bilateral primary osteoarthritis of hip: Secondary | ICD-10-CM | POA: Diagnosis not present

## 2021-09-05 DIAGNOSIS — D630 Anemia in neoplastic disease: Secondary | ICD-10-CM | POA: Diagnosis not present

## 2021-09-05 DIAGNOSIS — I130 Hypertensive heart and chronic kidney disease with heart failure and stage 1 through stage 4 chronic kidney disease, or unspecified chronic kidney disease: Secondary | ICD-10-CM | POA: Diagnosis not present

## 2021-09-05 DIAGNOSIS — J9611 Chronic respiratory failure with hypoxia: Secondary | ICD-10-CM | POA: Diagnosis not present

## 2021-09-05 DIAGNOSIS — E669 Obesity, unspecified: Secondary | ICD-10-CM | POA: Diagnosis not present

## 2021-09-05 MED FILL — ALBUTEROL HFA 90 MCG INHALER: 16 days supply | Fill #3

## 2021-09-05 MED FILL — IPRAT-ALBUT 0.5-3(2.5) MG/3 ML: 90 days supply | Qty: 360 | Fill #1

## 2021-09-06 DIAGNOSIS — Z08 Encounter for follow-up examination after completed treatment for malignant neoplasm: Secondary | ICD-10-CM | POA: Diagnosis not present

## 2021-09-06 DIAGNOSIS — J449 Chronic obstructive pulmonary disease, unspecified: Secondary | ICD-10-CM | POA: Diagnosis not present

## 2021-09-06 DIAGNOSIS — Z85828 Personal history of other malignant neoplasm of skin: Secondary | ICD-10-CM | POA: Diagnosis not present

## 2021-09-06 DIAGNOSIS — C44619 Basal cell carcinoma of skin of left upper limb, including shoulder: Secondary | ICD-10-CM | POA: Diagnosis not present

## 2021-09-06 DIAGNOSIS — J9610 Chronic respiratory failure, unspecified whether with hypoxia or hypercapnia: Secondary | ICD-10-CM | POA: Diagnosis not present

## 2021-09-06 DIAGNOSIS — R0602 Shortness of breath: Secondary | ICD-10-CM | POA: Diagnosis not present

## 2021-09-06 DIAGNOSIS — C9110 Chronic lymphocytic leukemia of B-cell type not having achieved remission: Secondary | ICD-10-CM | POA: Diagnosis not present

## 2021-09-06 MED FILL — CEPHALEXIN 500 MG CAPSULE: 14 days supply | Qty: 42 | Fill #0

## 2021-09-06 MED FILL — MORPHINE SULFATE IR 15 MG TAB: 7 days supply | Qty: 14 | Fill #0

## 2021-09-07 DIAGNOSIS — D631 Anemia in chronic kidney disease: Secondary | ICD-10-CM | POA: Diagnosis not present

## 2021-09-07 DIAGNOSIS — J9611 Chronic respiratory failure with hypoxia: Secondary | ICD-10-CM | POA: Diagnosis not present

## 2021-09-07 DIAGNOSIS — I130 Hypertensive heart and chronic kidney disease with heart failure and stage 1 through stage 4 chronic kidney disease, or unspecified chronic kidney disease: Secondary | ICD-10-CM | POA: Diagnosis not present

## 2021-09-07 DIAGNOSIS — F32A Depression, unspecified: Secondary | ICD-10-CM | POA: Diagnosis not present

## 2021-09-07 DIAGNOSIS — F419 Anxiety disorder, unspecified: Secondary | ICD-10-CM | POA: Diagnosis not present

## 2021-09-07 DIAGNOSIS — N189 Chronic kidney disease, unspecified: Secondary | ICD-10-CM | POA: Diagnosis not present

## 2021-09-07 DIAGNOSIS — E669 Obesity, unspecified: Secondary | ICD-10-CM | POA: Diagnosis not present

## 2021-09-07 DIAGNOSIS — H6122 Impacted cerumen, left ear: Secondary | ICD-10-CM | POA: Diagnosis not present

## 2021-09-07 DIAGNOSIS — G4733 Obstructive sleep apnea (adult) (pediatric): Secondary | ICD-10-CM | POA: Diagnosis not present

## 2021-09-07 DIAGNOSIS — I5032 Chronic diastolic (congestive) heart failure: Secondary | ICD-10-CM | POA: Diagnosis not present

## 2021-09-07 DIAGNOSIS — D509 Iron deficiency anemia, unspecified: Secondary | ICD-10-CM | POA: Diagnosis not present

## 2021-09-07 DIAGNOSIS — J449 Chronic obstructive pulmonary disease, unspecified: Secondary | ICD-10-CM | POA: Diagnosis not present

## 2021-09-07 DIAGNOSIS — M160 Bilateral primary osteoarthritis of hip: Secondary | ICD-10-CM | POA: Diagnosis not present

## 2021-09-07 DIAGNOSIS — D630 Anemia in neoplastic disease: Secondary | ICD-10-CM | POA: Diagnosis not present

## 2021-09-07 DIAGNOSIS — M170 Bilateral primary osteoarthritis of knee: Secondary | ICD-10-CM | POA: Diagnosis not present

## 2021-09-07 DIAGNOSIS — C9111 Chronic lymphocytic leukemia of B-cell type in remission: Secondary | ICD-10-CM | POA: Diagnosis not present

## 2021-09-08 DIAGNOSIS — D631 Anemia in chronic kidney disease: Secondary | ICD-10-CM | POA: Diagnosis not present

## 2021-09-08 DIAGNOSIS — D509 Iron deficiency anemia, unspecified: Secondary | ICD-10-CM | POA: Diagnosis not present

## 2021-09-08 DIAGNOSIS — N189 Chronic kidney disease, unspecified: Secondary | ICD-10-CM | POA: Diagnosis not present

## 2021-09-08 DIAGNOSIS — D630 Anemia in neoplastic disease: Secondary | ICD-10-CM | POA: Diagnosis not present

## 2021-09-08 DIAGNOSIS — F32A Depression, unspecified: Secondary | ICD-10-CM | POA: Diagnosis not present

## 2021-09-08 DIAGNOSIS — F419 Anxiety disorder, unspecified: Secondary | ICD-10-CM | POA: Diagnosis not present

## 2021-09-08 DIAGNOSIS — M160 Bilateral primary osteoarthritis of hip: Secondary | ICD-10-CM | POA: Diagnosis not present

## 2021-09-08 DIAGNOSIS — I130 Hypertensive heart and chronic kidney disease with heart failure and stage 1 through stage 4 chronic kidney disease, or unspecified chronic kidney disease: Secondary | ICD-10-CM | POA: Diagnosis not present

## 2021-09-08 DIAGNOSIS — G4733 Obstructive sleep apnea (adult) (pediatric): Secondary | ICD-10-CM | POA: Diagnosis not present

## 2021-09-08 DIAGNOSIS — J9611 Chronic respiratory failure with hypoxia: Secondary | ICD-10-CM | POA: Diagnosis not present

## 2021-09-08 DIAGNOSIS — J449 Chronic obstructive pulmonary disease, unspecified: Secondary | ICD-10-CM | POA: Diagnosis not present

## 2021-09-08 DIAGNOSIS — C9111 Chronic lymphocytic leukemia of B-cell type in remission: Secondary | ICD-10-CM | POA: Diagnosis not present

## 2021-09-08 DIAGNOSIS — H6122 Impacted cerumen, left ear: Secondary | ICD-10-CM | POA: Diagnosis not present

## 2021-09-08 DIAGNOSIS — M170 Bilateral primary osteoarthritis of knee: Secondary | ICD-10-CM | POA: Diagnosis not present

## 2021-09-08 DIAGNOSIS — I5032 Chronic diastolic (congestive) heart failure: Secondary | ICD-10-CM | POA: Diagnosis not present

## 2021-09-08 DIAGNOSIS — E669 Obesity, unspecified: Secondary | ICD-10-CM | POA: Diagnosis not present

## 2021-09-12 DIAGNOSIS — C9111 Chronic lymphocytic leukemia of B-cell type in remission: Secondary | ICD-10-CM | POA: Diagnosis not present

## 2021-09-12 DIAGNOSIS — G4733 Obstructive sleep apnea (adult) (pediatric): Secondary | ICD-10-CM | POA: Diagnosis not present

## 2021-09-12 DIAGNOSIS — D509 Iron deficiency anemia, unspecified: Secondary | ICD-10-CM | POA: Diagnosis not present

## 2021-09-12 DIAGNOSIS — M160 Bilateral primary osteoarthritis of hip: Secondary | ICD-10-CM | POA: Diagnosis not present

## 2021-09-12 DIAGNOSIS — D631 Anemia in chronic kidney disease: Secondary | ICD-10-CM | POA: Diagnosis not present

## 2021-09-12 DIAGNOSIS — J449 Chronic obstructive pulmonary disease, unspecified: Secondary | ICD-10-CM | POA: Diagnosis not present

## 2021-09-12 DIAGNOSIS — N189 Chronic kidney disease, unspecified: Secondary | ICD-10-CM | POA: Diagnosis not present

## 2021-09-12 DIAGNOSIS — I130 Hypertensive heart and chronic kidney disease with heart failure and stage 1 through stage 4 chronic kidney disease, or unspecified chronic kidney disease: Secondary | ICD-10-CM | POA: Diagnosis not present

## 2021-09-12 DIAGNOSIS — H6122 Impacted cerumen, left ear: Secondary | ICD-10-CM | POA: Diagnosis not present

## 2021-09-12 DIAGNOSIS — D630 Anemia in neoplastic disease: Secondary | ICD-10-CM | POA: Diagnosis not present

## 2021-09-12 DIAGNOSIS — M170 Bilateral primary osteoarthritis of knee: Secondary | ICD-10-CM | POA: Diagnosis not present

## 2021-09-12 DIAGNOSIS — E669 Obesity, unspecified: Secondary | ICD-10-CM | POA: Diagnosis not present

## 2021-09-12 DIAGNOSIS — J9611 Chronic respiratory failure with hypoxia: Secondary | ICD-10-CM | POA: Diagnosis not present

## 2021-09-12 DIAGNOSIS — I5032 Chronic diastolic (congestive) heart failure: Secondary | ICD-10-CM | POA: Diagnosis not present

## 2021-09-12 DIAGNOSIS — F32A Depression, unspecified: Secondary | ICD-10-CM | POA: Diagnosis not present

## 2021-09-12 DIAGNOSIS — F419 Anxiety disorder, unspecified: Secondary | ICD-10-CM | POA: Diagnosis not present

## 2021-09-14 DIAGNOSIS — D509 Iron deficiency anemia, unspecified: Secondary | ICD-10-CM | POA: Diagnosis not present

## 2021-09-14 DIAGNOSIS — I130 Hypertensive heart and chronic kidney disease with heart failure and stage 1 through stage 4 chronic kidney disease, or unspecified chronic kidney disease: Secondary | ICD-10-CM | POA: Diagnosis not present

## 2021-09-14 DIAGNOSIS — M170 Bilateral primary osteoarthritis of knee: Secondary | ICD-10-CM | POA: Diagnosis not present

## 2021-09-14 DIAGNOSIS — H6122 Impacted cerumen, left ear: Secondary | ICD-10-CM | POA: Diagnosis not present

## 2021-09-14 DIAGNOSIS — F32A Depression, unspecified: Secondary | ICD-10-CM | POA: Diagnosis not present

## 2021-09-14 DIAGNOSIS — F419 Anxiety disorder, unspecified: Secondary | ICD-10-CM | POA: Diagnosis not present

## 2021-09-14 DIAGNOSIS — G4733 Obstructive sleep apnea (adult) (pediatric): Secondary | ICD-10-CM | POA: Diagnosis not present

## 2021-09-14 DIAGNOSIS — I5032 Chronic diastolic (congestive) heart failure: Secondary | ICD-10-CM | POA: Diagnosis not present

## 2021-09-14 DIAGNOSIS — M160 Bilateral primary osteoarthritis of hip: Secondary | ICD-10-CM | POA: Diagnosis not present

## 2021-09-14 DIAGNOSIS — N189 Chronic kidney disease, unspecified: Secondary | ICD-10-CM | POA: Diagnosis not present

## 2021-09-14 DIAGNOSIS — J449 Chronic obstructive pulmonary disease, unspecified: Secondary | ICD-10-CM | POA: Diagnosis not present

## 2021-09-14 DIAGNOSIS — E669 Obesity, unspecified: Secondary | ICD-10-CM | POA: Diagnosis not present

## 2021-09-14 DIAGNOSIS — C9111 Chronic lymphocytic leukemia of B-cell type in remission: Secondary | ICD-10-CM | POA: Diagnosis not present

## 2021-09-14 DIAGNOSIS — D631 Anemia in chronic kidney disease: Secondary | ICD-10-CM | POA: Diagnosis not present

## 2021-09-14 DIAGNOSIS — D630 Anemia in neoplastic disease: Secondary | ICD-10-CM | POA: Diagnosis not present

## 2021-09-14 DIAGNOSIS — J9611 Chronic respiratory failure with hypoxia: Secondary | ICD-10-CM | POA: Diagnosis not present

## 2021-09-15 DIAGNOSIS — D631 Anemia in chronic kidney disease: Secondary | ICD-10-CM | POA: Diagnosis not present

## 2021-09-15 DIAGNOSIS — I130 Hypertensive heart and chronic kidney disease with heart failure and stage 1 through stage 4 chronic kidney disease, or unspecified chronic kidney disease: Secondary | ICD-10-CM | POA: Diagnosis not present

## 2021-09-15 DIAGNOSIS — D630 Anemia in neoplastic disease: Secondary | ICD-10-CM | POA: Diagnosis not present

## 2021-09-15 DIAGNOSIS — J9611 Chronic respiratory failure with hypoxia: Secondary | ICD-10-CM | POA: Diagnosis not present

## 2021-09-15 DIAGNOSIS — F32A Depression, unspecified: Secondary | ICD-10-CM | POA: Diagnosis not present

## 2021-09-15 DIAGNOSIS — M170 Bilateral primary osteoarthritis of knee: Secondary | ICD-10-CM | POA: Diagnosis not present

## 2021-09-15 DIAGNOSIS — J449 Chronic obstructive pulmonary disease, unspecified: Secondary | ICD-10-CM | POA: Diagnosis not present

## 2021-09-15 DIAGNOSIS — G4733 Obstructive sleep apnea (adult) (pediatric): Secondary | ICD-10-CM | POA: Diagnosis not present

## 2021-09-15 DIAGNOSIS — N189 Chronic kidney disease, unspecified: Secondary | ICD-10-CM | POA: Diagnosis not present

## 2021-09-15 DIAGNOSIS — H6122 Impacted cerumen, left ear: Secondary | ICD-10-CM | POA: Diagnosis not present

## 2021-09-15 DIAGNOSIS — M160 Bilateral primary osteoarthritis of hip: Secondary | ICD-10-CM | POA: Diagnosis not present

## 2021-09-15 DIAGNOSIS — D509 Iron deficiency anemia, unspecified: Secondary | ICD-10-CM | POA: Diagnosis not present

## 2021-09-15 DIAGNOSIS — E669 Obesity, unspecified: Secondary | ICD-10-CM | POA: Diagnosis not present

## 2021-09-15 DIAGNOSIS — C9111 Chronic lymphocytic leukemia of B-cell type in remission: Secondary | ICD-10-CM | POA: Diagnosis not present

## 2021-09-15 DIAGNOSIS — F419 Anxiety disorder, unspecified: Secondary | ICD-10-CM | POA: Diagnosis not present

## 2021-09-15 DIAGNOSIS — I5032 Chronic diastolic (congestive) heart failure: Secondary | ICD-10-CM | POA: Diagnosis not present

## 2021-09-18 ENCOUNTER — Other Ambulatory Visit (HOSPITAL_COMMUNITY): Payer: Self-pay

## 2021-09-20 ENCOUNTER — Telehealth: Payer: Self-pay | Admitting: Internal Medicine

## 2021-09-21 ENCOUNTER — Telehealth: Payer: Self-pay

## 2021-09-21 NOTE — Telephone Encounter (Signed)
He had pneumonia on the CT scan end of September 2022. He will need a followup CT. Can be postponed to around [place] or Jan 2023 - CT chest without contrast

## 2021-09-21 NOTE — Telephone Encounter (Signed)
I see that a CT Angio was completed on 08/21/2021. Does the patient still need a CT on 10/10/21? Please advise.

## 2021-09-21 NOTE — Telephone Encounter (Signed)
I called the patient and his wife is aware that we can postpone the scan per Dr. Chase Caller.   We can change the date until 10/2021. Do I need to change the date?  Please advise.

## 2021-09-21 NOTE — Telephone Encounter (Signed)
Received message to call patient/wife as he thinks he has pneumonia.Productive cough with yellow/white phlegm. Patient denies any fever, only slight worsening shortness of breath. Patient states he is taking robitussin for his cough which has helped a little.

## 2021-09-25 ENCOUNTER — Telehealth (INDEPENDENT_AMBULATORY_CARE_PROVIDER_SITE_OTHER): Payer: BC Managed Care – PPO | Admitting: Adult Health

## 2021-09-25 ENCOUNTER — Encounter: Payer: Self-pay | Admitting: Adult Health

## 2021-09-25 ENCOUNTER — Telehealth: Payer: Self-pay | Admitting: Internal Medicine

## 2021-09-25 ENCOUNTER — Other Ambulatory Visit (HOSPITAL_COMMUNITY): Payer: Self-pay

## 2021-09-25 DIAGNOSIS — R5381 Other malaise: Secondary | ICD-10-CM

## 2021-09-25 DIAGNOSIS — J9612 Chronic respiratory failure with hypercapnia: Secondary | ICD-10-CM | POA: Diagnosis not present

## 2021-09-25 DIAGNOSIS — J441 Chronic obstructive pulmonary disease with (acute) exacerbation: Secondary | ICD-10-CM | POA: Diagnosis not present

## 2021-09-25 MED ORDER — PREDNISONE 20 MG PO TABS: 20.0000 mg | ORAL_TABLET | Freq: Every day | ORAL | 0 refills | Status: DC

## 2021-09-25 MED ORDER — AMOXICILLIN-POT CLAVULANATE 875-125 MG PO TABS: 1.0000 | ORAL_TABLET | Freq: Two times a day (BID) | ORAL | 0 refills | Status: AC

## 2021-09-25 MED FILL — AMOX-CLAV 875-125 MG TABLET: 7 days supply | Qty: 14 | Fill #0

## 2021-09-25 MED FILL — IMBRUVICA 420 MG TABS: 28 days supply | Qty: 28 | Fill #2

## 2021-09-25 MED FILL — GABAPENTIN 100 MG CAPSULE: 30 days supply | Qty: 30 | Fill #1

## 2021-09-25 MED FILL — PREDNISONE 20 MG TABLET: 5 days supply | Qty: 5 | Fill #0

## 2021-09-25 NOTE — Progress Notes (Signed)
Virtual Visit via Video Note  I connected with [place] Ranger on 09/25/21 at 11:00 AM EDT by a video enabled telemedicine application and verified that I am speaking with the correct person using two identifiers.  Location: Patient: Home  Provider: Office    I discussed the limitations of evaluation and management by telemedicine and the availability of in person appointments. The patient expressed understanding and agreed to proceed.  History of Present Illness: 65 year old old male former smoker followed for very severe COPD and chronic hypoxic respiratory failure on oxygen Medical history significant for CLL  Today's video visit is for an acute work in . Complains of increased cough and congestion for last 3 days.  Feels that his breathing is not doing as good.  He remains on budesonide nebulizer twice daily and DuoNeb nebulizer 4 times daily.  He is using his flutter valve 2-3 times a day.  He does remain on oxygen 2 to 3 L daily.  Patient says that he has significant coughing spasms at times.  Gets short of breath with minimal activity.  Patient is followed by palliative care.  Patient says most the time he is bedbound.  Is very sedentary.  Patient was hospitalized last month for a COPD exacerbation with acute on chronic hypercarbic and hypoxic respiratory failure, on arrival to the hospital patient had respiratory arrest requiring bag mask ventilation without CPR.  He did regain consciousness and was placed on BiPAP support.  He was treated with empiric antibiotics, steroids and nebulized bronchodilators.  Chest x-ray showed improving edema/pneumonia.  He did require diuresis for volume overload.  Since discharge patient was starting to feel better until last 3 days ago.   Followed by Palliative care.  Patient wife is at home with him and is his caregiver.  We did discuss in-home needs and severity of his COPD.  Talked about possible transition to hospice from palliative care.  Patient does  have a office visit in person in 2 weeks.  We will discuss further at that visit  Past Medical History:  Diagnosis Date   Acute on chronic respiratory failure with hypoxia (HCC)    Anxiety    Chronic lymphocytic leukemia in remission (HCC)    COPD (chronic obstructive pulmonary disease) (HCC)    COPD, severe (HCC)    GERD (gastroesophageal reflux disease)    Hematuria    Hyperlipidemia    Hypertension    Insomnia    Left lower lobe pneumonia    Nephrolithiasis    Tobacco dependence     Current Outpatient Medications on File Prior to Visit  Medication Sig Dispense Refill   albuterol (PROVENTIL) (2.5 MG/3ML) 0.083% nebulizer solution Take 3 mLs (2.5 mg total) by nebulization every 4 (four) hours as needed for wheezing or shortness of breath. (Patient taking differently: Take 2.5 mg by nebulization every 4 (four) hours.) 75 mL 12   albuterol (VENTOLIN HFA) 108 (90 Base) MCG/ACT inhaler INHALE 2 PUFFS INTO THE LUNGS EVERY 4 (FOUR) HOURS AS NEEDED FOR WHEEZING OR SHORTNESS OF BREATH. (Patient taking differently: Inhale 2 puffs into the lungs every 4 (four) hours.) 8.5 each 5   allopurinol (ZYLOPRIM) 100 MG tablet Place 1 tablet (100 mg total) into feeding tube daily. 90 tablet 3   benzonatate (TESSALON) 200 MG capsule Take 200 mg by mouth 3 (three) times daily as needed.     budesonide (PULMICORT) 0.5 MG/2ML nebulizer solution TAKE 2 ML BY NEBULIZATION 2 TIMES DAILY. 360 mL 5   cloNIDine (  CATAPRES - DOSED IN MG/24 HR) 0.1 mg/24hr patch Place 0.1 mg onto the skin once a week. Tuesdays     docusate sodium (COLACE) 100 MG capsule Take 100 mg by mouth daily.     Ensure (ENSURE) Take 237 mLs by mouth daily. Chocolate flavor     furosemide (LASIX) 40 MG tablet Take 40 mg by mouth daily.     gabapentin (NEURONTIN) 100 MG capsule Take 100 mg by mouth daily.     hydrOXYzine (VISTARIL) 25 MG capsule Take 50 mg by mouth at bedtime.     ibrutinib (IMBRUVICA) 420 MG TABS Take 1 tablet (420 mg) by mouth  daily. 30 tablet 3   ipratropium-albuterol (DUONEB) 0.5-2.5 (3) MG/3ML SOLN TAKE 3 MLS (ONE VIAL) BY NEBULIZATION EVERY 6 (SIX) HOURS AS NEEDED. (Patient taking differently: Inhale 3 mLs into the lungs every 4 (four) hours.) 360 mL 3   loratadine (CLARITIN) 10 MG tablet Take 10 mg by mouth daily.     LORazepam (ATIVAN) 0.5 MG tablet Take 0.5 mg by mouth 2 (two) times daily as needed.     metoprolol tartrate (LOPRESSOR) 25 MG tablet Take 25 mg by mouth 2 (two) times daily.     montelukast (SINGULAIR) 10 MG tablet Take 10 mg by mouth every evening.     pantoprazole (PROTONIX) 40 MG tablet Take 40 mg by mouth at bedtime.     sodium chloride (OCEAN) 0.65 % SOLN nasal spray Place 2 sprays into both nostrils as needed for congestion. (Patient taking differently: Place 2 sprays into both nostrils daily as needed for congestion.)  0   Tetrahydrozoline HCl (VISINE OP) Apply 1 drop to eye daily.     guaiFENesin (MUCINEX) 600 MG 12 hr tablet Take 600 mg by mouth daily. (Patient not taking: Reported on 09/25/2021)     morphine (MSIR) 15 MG tablet Take 7.5 mg by mouth every 6 (six) hours as needed. (Patient not taking: Reported on 09/25/2021)     polyethylene glycol (MIRALAX / GLYCOLAX) 17 g packet Place 17 g into feeding tube daily as needed for moderate constipation. (Patient not taking: Reported on 09/25/2021) 14 each 0   No current facility-administered medications on file prior to visit.      Observations/Objective: Echo 11/2020 EF 60-65%, RV size nml     PFT July 2015 FEV1 24%, ratio 42, FVC 44%, no significant bronchodilator response, DLCO 26%.  Lying in bed with Oxygen on. Chronically ill appearing .   Assessment and Plan: Acute COPD exacerbation.  Patient is prone to frequent flares.  Was recently hospitalized.  We will treat with empiric antibiotics and steroids. Continue on aggressive maintenance regimen.  Chronic respiratory failure continue on oxygen to maintain O2 saturations greater  than 88 to 90%.  Current oxygen needs are 2 to 3 L.  Diastolic heart failure.  Patient says that his leg swelling has been under good control.  Patient's continue on current regimen.  Severe physical deconditioning.  Patient has very severe COPD and oxygen dependent respiratory failure.  He is followed by palliative care.  Patient is bedbound.  May need to consider transition to hospice at some point in the near future.  Plan  Patient Instructions  Begin Augmentin 875mg  Twice daily   Prednisone 20mg  daily for 5 days .  Mucinex DM  Liquid Twice daily  As needed  Cough/congestion  Continue on Budesonide Neb Twice daily   Continue on Duoneb Four times a day  .  Flutter valve Three times  a day  .  Continue on  Oxygen 2-3 l/m to maintain O2 sats >88-90%.  Activity as tolerated.  High protein diet.  Follow up with Dr. Chase Caller in 2 weeks as planned and As needed   Please contact office for sooner follow up if symptoms do not improve or worsen or seek emergency care     Follow Up Instructions:    I discussed the assessment and treatment plan with the patient. The patient was provided an opportunity to ask questions and all were answered. The patient agreed with the plan and demonstrated an understanding of the instructions.   The patient was advised to call back or seek an in-person evaluation if the symptoms worsen or if the condition fails to improve as anticipated.  I provided 30 minutes of non-face-to-face time during this encounter.   Rexene Edison, NP

## 2021-09-25 NOTE — Patient Instructions (Addendum)
Begin Augmentin 875mg  Twice daily   Prednisone 20mg  daily for 5 days .  Mucinex DM  Liquid Twice daily  As needed  Cough/congestion  Continue on Budesonide Neb Twice daily   Continue on Duoneb Four times a day  .  Flutter valve Three times a day  .  Continue on  Oxygen 2-3 l/m to maintain O2 sats >88-90%.  Activity as tolerated.  High protein diet.  Follow up with Dr. Chase Caller in 2 weeks as planned and As needed   Please contact office for sooner follow up if symptoms do not improve or worsen or seek emergency care

## 2021-09-25 NOTE — Telephone Encounter (Signed)
New order is not needed.  I will reschedule CT and call pt.

## 2021-09-25 NOTE — Telephone Encounter (Signed)
I called the patient and the wife answered the phone and report that he has a cough, congestion and phlegm. He has not taken anything and was offered a MyChart video visit with a NP today. Nothing further needed.

## 2021-09-26 DIAGNOSIS — Z08 Encounter for follow-up examination after completed treatment for malignant neoplasm: Secondary | ICD-10-CM | POA: Diagnosis not present

## 2021-09-26 DIAGNOSIS — Z85828 Personal history of other malignant neoplasm of skin: Secondary | ICD-10-CM | POA: Diagnosis not present

## 2021-09-27 MED FILL — PANTOPRAZOLE SODIUM 40 MG TBEC: 90 days supply | Qty: 90 | Fill #0

## 2021-09-27 MED FILL — FUROSEMIDE 40 MG TABS: 90 days supply | Qty: 90 | Fill #0

## 2021-09-27 MED FILL — ALLOPURINOL 100 MG TABLET: 90 days supply | Qty: 90 | Fill #1

## 2021-10-02 MED FILL — BENZONATATE 200 MG CAPSULE: 30 days supply | Qty: 90 | Fill #0

## 2021-10-03 DIAGNOSIS — J9620 Acute and chronic respiratory failure, unspecified whether with hypoxia or hypercapnia: Secondary | ICD-10-CM | POA: Diagnosis not present

## 2021-10-03 DIAGNOSIS — J449 Chronic obstructive pulmonary disease, unspecified: Secondary | ICD-10-CM | POA: Diagnosis not present

## 2021-10-05 DIAGNOSIS — J9620 Acute and chronic respiratory failure, unspecified whether with hypoxia or hypercapnia: Secondary | ICD-10-CM | POA: Diagnosis not present

## 2021-10-05 DIAGNOSIS — J449 Chronic obstructive pulmonary disease, unspecified: Secondary | ICD-10-CM | POA: Diagnosis not present

## 2021-10-06 MED FILL — METOPROLOL TARTRATE 25 MG TAB: 32 days supply | Qty: 64 | Fill #0

## 2021-10-06 MED FILL — ALBUTEROL HFA 90 MCG INHALER: 16 days supply | Fill #4

## 2021-10-10 ENCOUNTER — Other Ambulatory Visit: Payer: Medicare Other

## 2021-10-12 ENCOUNTER — Ambulatory Visit: Payer: BC Managed Care – PPO | Admitting: Internal Medicine

## 2021-10-12 ENCOUNTER — Inpatient Hospital Stay: Payer: BC Managed Care – PPO | Admitting: Oncology

## 2021-10-12 ENCOUNTER — Inpatient Hospital Stay: Payer: BC Managed Care – PPO | Attending: Oncology

## 2021-10-12 ENCOUNTER — Telehealth: Payer: Self-pay | Admitting: Internal Medicine

## 2021-10-12 ENCOUNTER — Telehealth: Payer: Self-pay | Admitting: *Deleted

## 2021-10-12 MED ORDER — DOXYCYCLINE HYCLATE 100 MG PO TABS: 100.0000 mg | ORAL_TABLET | Freq: Two times a day (BID) | ORAL | 0 refills | Status: DC

## 2021-10-12 MED ORDER — PREDNISONE 5 MG PO TABS: ORAL_TABLET | ORAL | 0 refills | Status: DC

## 2021-10-12 MED FILL — DOXYCYCLINE HYCLATE 100 MG TAB: 5 days supply | Qty: 10 | Fill #0

## 2021-10-12 MED FILL — PREDNISONE 5 MG TABLET: 5 days supply | Qty: 21 | Fill #0

## 2021-10-12 NOTE — Telephone Encounter (Signed)
[place] wife states patient having symptoms of shortness of breath and cough. Pharmacy is [facility] [address] phone number is [phone].

## 2021-10-12 NOTE — Telephone Encounter (Signed)
Called and spoke with patient and his wife. Patient has been more SOB over the past few days and increased greatly today. He has a productive cough with yellowish, greenish phlegm. He has been taking OTC cough syrup which has not been helping. The duonebs will work but only for a short period of time.   He denied any fever, body aches. Increased fatigue. He is wondering if he has PNA.   He had an appt today at 130pm but did not feel like coming in. He had wanted to convert it to a video visit but never heard back from [place].   [facility] [address].   MR, can you please advise?

## 2021-10-12 NOTE — Telephone Encounter (Signed)
I was seeing patients and did not pick up on this message till now.  Had been on through secure chat  Done a video visit.  In any event now -can treat a COPD exacerbation  Please take prednisone 40 mg x1 day, then 30 mg x1 day, then 20 mg x1 day, then 10 mg x1 day, and then 5 mg x1 day and stop   Take doxycycline 100mg  po twice daily x 5 days; take after meals and avoid sunlight  Can do a video visit tomorrow with the nurse practitioner   Allergies  Allergen Reactions   Codeine Itching and Other (See Comments)    "jittery"   Prozac [Fluoxetine Hcl] Other (See Comments)    confusion

## 2021-10-12 NOTE — Telephone Encounter (Signed)
I have called and spoke with pts wife and she is aware of MR recs.  She stated that he will do the meds for now and if he feels like he needs to be seen they will call back for an appt. Meds sent to the pharmacy and pt is aware.

## 2021-10-12 NOTE — Telephone Encounter (Signed)
MR please advise. Thanks! 

## 2021-10-12 NOTE — Telephone Encounter (Signed)
PC to patient, spoke with his wife regarding missed appointments today, she reports patient isn't feeling well.  Informed her scheduling will contact her to reschedule his appointment.  She verbalizes understanding.  Scheduling message sent.

## 2021-10-13 ENCOUNTER — Telehealth: Payer: Self-pay | Admitting: Oncology

## 2021-10-13 NOTE — Telephone Encounter (Signed)
Sch per 11/17 inbasket, pt aware

## 2021-10-15 MED FILL — HYDROXYZINE PAM 25 MG CAP: 90 days supply | Qty: 270 | Fill #1

## 2021-10-18 ENCOUNTER — Other Ambulatory Visit (HOSPITAL_COMMUNITY): Payer: Self-pay

## 2021-10-23 ENCOUNTER — Other Ambulatory Visit (HOSPITAL_COMMUNITY): Payer: Self-pay

## 2021-10-23 MED FILL — IMBRUVICA 420 MG TABS: 28 days supply | Qty: 28 | Fill #3

## 2021-10-24 DIAGNOSIS — C9110 Chronic lymphocytic leukemia of B-cell type not having achieved remission: Secondary | ICD-10-CM | POA: Diagnosis not present

## 2021-10-24 DIAGNOSIS — C44619 Basal cell carcinoma of skin of left upper limb, including shoulder: Secondary | ICD-10-CM | POA: Diagnosis not present

## 2021-10-24 DIAGNOSIS — J9610 Chronic respiratory failure, unspecified whether with hypoxia or hypercapnia: Secondary | ICD-10-CM | POA: Diagnosis not present

## 2021-10-24 DIAGNOSIS — J449 Chronic obstructive pulmonary disease, unspecified: Secondary | ICD-10-CM | POA: Diagnosis not present

## 2021-10-24 DIAGNOSIS — Z515 Encounter for palliative care: Secondary | ICD-10-CM | POA: Diagnosis not present

## 2021-10-24 DIAGNOSIS — R0602 Shortness of breath: Secondary | ICD-10-CM | POA: Diagnosis not present

## 2021-10-27 ENCOUNTER — Inpatient Hospital Stay: Payer: BC Managed Care – PPO | Attending: Oncology

## 2021-10-27 ENCOUNTER — Inpatient Hospital Stay (HOSPITAL_BASED_OUTPATIENT_CLINIC_OR_DEPARTMENT_OTHER): Payer: BC Managed Care – PPO | Admitting: Oncology

## 2021-10-27 ENCOUNTER — Other Ambulatory Visit (HOSPITAL_COMMUNITY): Payer: Self-pay

## 2021-10-27 ENCOUNTER — Other Ambulatory Visit: Payer: Self-pay

## 2021-10-27 VITALS — BP 157/86 | HR 90 | Temp 97.5°F | Resp 18 | Wt 207.2 lb

## 2021-10-27 DIAGNOSIS — Z79899 Other long term (current) drug therapy: Secondary | ICD-10-CM | POA: Diagnosis not present

## 2021-10-27 DIAGNOSIS — C9110 Chronic lymphocytic leukemia of B-cell type not having achieved remission: Secondary | ICD-10-CM | POA: Diagnosis not present

## 2021-10-27 LAB — CMP (CANCER CENTER ONLY)
ALT: 22 U/L (ref 0–44)
AST: 18 U/L (ref 15–41)
Albumin: 3.8 g/dL (ref 3.5–5.0)
Alkaline Phosphatase: 116 U/L (ref 38–126)
Anion gap: 12 (ref 5–15)
BUN: 12 mg/dL (ref 8–23)
CO2: 41 mmol/L — ABNORMAL HIGH (ref 22–32)
Calcium: 8.9 mg/dL (ref 8.9–10.3)
Chloride: 93 mmol/L — ABNORMAL LOW (ref 98–111)
Creatinine: 1.01 mg/dL (ref 0.61–1.24)
GFR, Estimated: >60 mL/min (ref >60)
Glucose, Bld: 122 mg/dL — ABNORMAL HIGH (ref 70–99)
Potassium: 3.5 mmol/L (ref 3.5–5.1)
Sodium: 146 mmol/L — ABNORMAL HIGH (ref 135–145)
Total Bilirubin: 0.8 mg/dL (ref 0.3–1.2)
Total Protein: 6.5 g/dL (ref 6.5–8.1)

## 2021-10-27 LAB — CBC WITH DIFFERENTIAL (CANCER CENTER ONLY)
Abs Immature Granulocytes: 0.17 10*3/uL — ABNORMAL HIGH (ref 0.00–0.07)
Basophils Absolute: 0.1 10*3/uL (ref 0.0–0.1)
Basophils Relative: 0 %
Eosinophils Absolute: 0.1 10*3/uL (ref 0.0–0.5)
Eosinophils Relative: 0 %
HCT: 44.8 % (ref 39.0–52.0)
Hemoglobin: 12.7 g/dL — ABNORMAL LOW (ref 13.0–17.0)
Immature Granulocytes: 0 %
Lymphocytes Relative: 89 %
Lymphs Abs: 56.5 10*3/uL — ABNORMAL HIGH (ref 0.7–4.0)
MCH: 25.3 pg — ABNORMAL LOW (ref 26.0–34.0)
MCHC: 28.3 g/dL — ABNORMAL LOW (ref 30.0–36.0)
MCV: 89.4 fL (ref 80.0–100.0)
Monocytes Absolute: 0.8 10*3/uL (ref 0.1–1.0)
Monocytes Relative: 1 %
Neutro Abs: 6.5 10*3/uL (ref 1.7–7.7)
Neutrophils Relative %: 10 %
Platelet Count: 252 10*3/uL (ref 150–400)
RBC: 5.01 MIL/uL (ref 4.22–5.81)
RDW: 14.3 % (ref 11.5–15.5)
Smear Review: NORMAL
WBC Count: 64.1 10*3/uL (ref 4.0–10.5)
nRBC: 0 % (ref 0.0–0.2)

## 2021-10-27 LAB — PHOSPHORUS: Phosphorus: 3.7 mg/dL (ref 2.5–4.6)

## 2021-10-27 LAB — URIC ACID: Uric Acid, Serum: 7.8 mg/dL (ref 3.7–8.6)

## 2021-10-27 NOTE — Progress Notes (Signed)
CRITICAL VALUE STICKER  CRITICAL VALUE: WBC 64.1  RECEIVER (on-site recipient of call): Alferd Apa LPN  [place] NOTIFIED: 10/27/21 @ 1517  MESSENGER (representative from lab): Lelan Pons  MD NOTIFIED: Dr. Alen Blew  TIME OF NOTIFICATION:1520  RESPONSE:  MD aware

## 2021-10-27 NOTE — Progress Notes (Signed)
Hematology and Oncology Follow Up for Telemedicine Visits  Isaac [name] [id number] 06-04-1956 66 y.o. 10/27/2021 3:03 PM Associates, [facility], [place] *      Principle Diagnosis: 65 year old man with CLL diagnosed in 2019.  He presented with lymphocytosis lymphadenopathy diagnosed in January 2019.       Current therapy: Imbruvica 420 mg daily started in May 2022.    Interim History: Isaac [name] returns today for a follow-up visit.  Since the last visit, he reports no major changes in his health.  He had denies any fevers, chills or sweats.  He denies any worsening adenopathy.  His performance status remains limited although unchanged.  He denies any palpitation or dyspnea on exertion.  He denies any petechiae or bleeding.     Medications: Reviewed without changes. Current Outpatient Medications  Medication Sig Dispense Refill   albuterol (PROVENTIL) (2.5 MG/3ML) 0.083% nebulizer solution Take 3 mLs (2.5 mg total) by nebulization every 4 (four) hours as needed for wheezing or shortness of breath. (Patient taking differently: Take 2.5 mg by nebulization every 4 (four) hours.) 75 mL 12   albuterol (VENTOLIN HFA) 108 (90 Base) MCG/ACT inhaler INHALE 2 PUFFS INTO THE LUNGS EVERY 4 (FOUR) HOURS AS NEEDED FOR WHEEZING OR SHORTNESS OF BREATH. (Patient taking differently: Inhale 2 puffs into the lungs every 4 (four) hours.) 8.5 each 5   allopurinol (ZYLOPRIM) 100 MG tablet Place 1 tablet (100 mg total) into feeding tube daily. 90 tablet 3   benzonatate (TESSALON) 200 MG capsule Take 200 mg by mouth 3 (three) times daily as needed.     budesonide (PULMICORT) 0.5 MG/2ML nebulizer solution TAKE 2 ML BY NEBULIZATION 2 TIMES DAILY. 360 mL 5   cloNIDine (CATAPRES - DOSED IN MG/24 HR) 0.1 mg/24hr patch Place 0.1 mg onto the skin once a week. Tuesdays     docusate sodium (COLACE) 100 MG capsule Take 100 mg by mouth daily.     doxycycline (VIBRA-TABS) 100 MG tablet Take 1  tablet (100 mg total) by mouth 2 (two) times daily. 10 tablet 0   Ensure (ENSURE) Take 237 mLs by mouth daily. Chocolate flavor     furosemide (LASIX) 40 MG tablet Take 40 mg by mouth daily.     gabapentin (NEURONTIN) 100 MG capsule Take 100 mg by mouth daily.     guaiFENesin (MUCINEX) 600 MG 12 hr tablet Take 600 mg by mouth daily. (Patient not taking: Reported on 09/25/2021)     hydrOXYzine (VISTARIL) 25 MG capsule Take 50 mg by mouth at bedtime.     ibrutinib (IMBRUVICA) 420 MG TABS Take 1 tablet (420 mg) by mouth daily. 30 tablet 3   ipratropium-albuterol (DUONEB) 0.5-2.5 (3) MG/3ML SOLN TAKE 3 MLS (ONE VIAL) BY NEBULIZATION EVERY 6 (SIX) HOURS AS NEEDED. (Patient taking differently: Inhale 3 mLs into the lungs every 4 (four) hours.) 360 mL 3   loratadine (CLARITIN) 10 MG tablet Take 10 mg by mouth daily.     LORazepam (ATIVAN) 0.5 MG tablet Take 0.5 mg by mouth 2 (two) times daily as needed.     metoprolol tartrate (LOPRESSOR) 25 MG tablet Take 25 mg by mouth 2 (two) times daily.     montelukast (SINGULAIR) 10 MG tablet Take 10 mg by mouth every evening.     morphine (MSIR) 15 MG tablet Take 7.5 mg by mouth every 6 (six) hours as needed. (Patient not taking: Reported on 09/25/2021)     pantoprazole (PROTONIX) 40 MG tablet Take 40 mg by  mouth at bedtime.     polyethylene glycol (MIRALAX / GLYCOLAX) 17 g packet Place 17 g into feeding tube daily as needed for moderate constipation. (Patient not taking: Reported on 09/25/2021) 14 each 0   predniSONE (DELTASONE) 20 MG tablet Take 1 tablet (20 mg total) by mouth daily with breakfast. 5 tablet 0   predniSONE (DELTASONE) 5 MG tablet 40 mg x1 day, then 30 mg x1 day, then 20 mg x1 day, then 10 mg x1 day, and then 5 mg x1 day and stop 21 tablet 0   sodium chloride (OCEAN) 0.65 % SOLN nasal spray Place 2 sprays into both nostrils as needed for congestion. (Patient taking differently: Place 2 sprays into both nostrils daily as needed for congestion.)  0    Tetrahydrozoline HCl (VISINE OP) Apply 1 drop to eye daily.     No current facility-administered medications for this visit.     Allergies:  Allergies  Allergen Reactions   Codeine Itching and Other (See Comments)    "jittery"   Prozac [Fluoxetine Hcl] Other (See Comments)    confusion   Physical exam  Blood pressure (!) 157/86, pulse 90, temperature (!) 97.5 F (36.4 C), temperature source Temporal, resp. rate 18, weight 207 lb 3.2 oz (94 kg), SpO2 100 %.  ECOG 2  General appearance: Comfortable appearing without any discomfort Head: Normocephalic without any trauma Oropharynx: Mucous membranes are moist and pink without any thrush or ulcers. Eyes: Pupils are equal and round reactive to light. Lymph nodes: No cervical, supraclavicular, inguinal or axillary lymphadenopathy.   Heart:regular rate and rhythm.  S1 and S2 without leg edema. Lung: Clear without any rhonchi or wheezes.  No dullness to percussion. Abdomin: Soft, nontender, nondistended with good bowel sounds.  No hepatosplenomegaly. Musculoskeletal: No joint deformity or effusion.  Full range of motion noted. Neurological: No deficits noted on motor, sensory and deep tendon reflex exam. Skin: No petechial rash or dryness.  Appeared moist.       Lab Results: Lab Results  Component Value Date   WBC 76.5 (HH) 08/24/2021   HGB 11.1 (L) 08/24/2021   HCT 37.3 (L) 08/24/2021   MCV 86.5 08/24/2021   PLT 185 08/24/2021     Chemistry      Component Value Date/Time   NA 139 08/24/2021 0322   K 4.2 08/24/2021 0322   CL 99 08/24/2021 0322   CO2 33 (H) 08/24/2021 0322   BUN 25 (H) 08/24/2021 0322   CREATININE 1.00 08/24/2021 0322   CREATININE 0.93 05/12/2021 1333      Component Value Date/Time   CALCIUM 8.9 08/24/2021 0322   ALKPHOS 62 08/24/2021 0322   AST 12 (L) 08/24/2021 0322   AST 12 (L) 05/12/2021 1333   ALT 9 08/24/2021 0322   ALT 8 05/12/2021 1333   BILITOT 0.6 08/24/2021 0322   BILITOT 1.1  05/12/2021 1333         Impression and Plan:  65 year old man:   1.      CLL diagnosed in 2019.  He presented with stage I disease including lymphocytosis and lymphadenopathy.    Laboratory data in September 2022 showed continuous gradual decline in his lymphocytosis with overall tolerance to Imbruvica.  Risks and benefits of continuing this treatment were discussed at this time.  Potential complications including bleeding as well as cardiac issues were reviewed.   Laboratory data from today reviewed and showed continuous slow decline in his white cell count that is currently at 64,000.  Hemoglobin  and platelet count are adequate.   After discussion today he is agreeable to continue given the tolerance and the excellent benefit.  Alternative treatment options such as systemic chemotherapy will be deferred at this time.    2.  Tumor lysis syndrome: No evidence of tumor lysis noted at this time.  Kidney function continues to be normal at this time.     3  Follow-up: He will return in 3 months for repeat follow-up.    30  minutes were dedicated to this visit. The time was spent on reviewing laboratory data, discussing complications related to therapy and outlining future plan of care.    Zola Button, MD 10/27/2021 3:03 PM

## 2021-10-30 ENCOUNTER — Telehealth: Payer: Self-pay

## 2021-10-30 ENCOUNTER — Telehealth: Payer: Self-pay | Admitting: Internal Medicine

## 2021-10-30 NOTE — Telephone Encounter (Signed)
Called and spoke with pt who states that he feels like he might have pna again.  States that he is coughing and getting up yellow phlegm. States due to his symptoms, he is not able to get any sleep.  States symptoms began about 3 days ago.  Pt states due to his symptoms, he has had trouble trying to catch his breath. Stated that he has been using 2.5L O2 and states that his sats have been ranging between 94-97% on his O2. Pt said when he has had a coughing spell, sats have dropped down to 91-92%.  Pt denies any complaints of any fever.  Stated to pt that we needed him to come in to the office for an appt so we could evaluate and have a cxr performed to see if he definitely does have pna and pt stated that he was unable to come into the office for an appt. Pt had me speak with his spouse Drenda Freeze and I stated that info to her as well. Drenda Freeze stated that she was going to call pt's Palliative care doctor to see what they recommend and to see if there was any way they could arrange for pt to have a cxr. Stated to her if not that pt would need to go to either UC or ED since pt was unable to come into our office to be evaluated and she verbalized understanding.

## 2021-10-30 NOTE — Telephone Encounter (Signed)
Patient's wife updated on new order from Dr. Mariea Clonts.

## 2021-10-30 NOTE — Telephone Encounter (Signed)
Phone call placed to patient. Spoke with his wife. Patient has productive cough, which has worsened over the past 3 days. Noted thick yellow phlegm. Worsening shortness of breath, afebrile. Spoke with Dr. Mariea Clonts. Orders received for Chest Xray.

## 2021-11-02 ENCOUNTER — Telehealth: Payer: Self-pay

## 2021-11-02 DIAGNOSIS — J9620 Acute and chronic respiratory failure, unspecified whether with hypoxia or hypercapnia: Secondary | ICD-10-CM | POA: Diagnosis not present

## 2021-11-02 DIAGNOSIS — J449 Chronic obstructive pulmonary disease, unspecified: Secondary | ICD-10-CM | POA: Diagnosis not present

## 2021-11-02 NOTE — Telephone Encounter (Signed)
Spoke with patient who stated that he still is not feeling well. No improvement. Provided update that Dr. Mariea Clonts ordered Augmentin. Instructed patient that to eat yogurt and/or take a probiotic while on this medication. Expressed understanding.

## 2021-11-02 NOTE — Telephone Encounter (Signed)
Late entry for 11/01/21: Patient and his wife updated that chest x ray - negative for pneumonia. Patient reported on going symptoms including loose cough, productive. Increased weakness and shortness of breath. Reports feeling pressure In his sinus and ears. Will update Dr. Mariea Clonts.

## 2021-11-02 NOTE — Telephone Encounter (Signed)
Prescription for Augmentin faxed to pharmacy.

## 2021-11-03 ENCOUNTER — Telehealth: Payer: Self-pay

## 2021-11-03 NOTE — Telephone Encounter (Signed)
Received a message that patient tested positive for COVID. Spoke with patient and his wife, who inquired if he should continue taking the Augmentin. Patient made aware, that per Dr. Mariea Clonts, to continue the Augmentin.

## 2021-11-04 ENCOUNTER — Emergency Department (HOSPITAL_COMMUNITY): Payer: BC Managed Care – PPO

## 2021-11-04 ENCOUNTER — Inpatient Hospital Stay (HOSPITAL_COMMUNITY)
Admission: EM | Admit: 2021-11-04 | Discharge: 2021-11-15 | DRG: 004 | Disposition: A | Discharge status: Other Acute Inpatient Hospital | Payer: BC Managed Care – PPO | Attending: Internal Medicine | Admitting: Internal Medicine

## 2021-11-04 DIAGNOSIS — J9602 Acute respiratory failure with hypercapnia: Secondary | ICD-10-CM

## 2021-11-04 DIAGNOSIS — Z430 Encounter for attention to tracheostomy: Secondary | ICD-10-CM | POA: Diagnosis not present

## 2021-11-04 DIAGNOSIS — L299 Pruritus, unspecified: Secondary | ICD-10-CM | POA: Diagnosis not present

## 2021-11-04 DIAGNOSIS — J9621 Acute and chronic respiratory failure with hypoxia: Secondary | ICD-10-CM | POA: Diagnosis present

## 2021-11-04 DIAGNOSIS — I452 Bifascicular block: Secondary | ICD-10-CM | POA: Diagnosis present

## 2021-11-04 DIAGNOSIS — J9600 Acute respiratory failure, unspecified whether with hypoxia or hypercapnia: Secondary | ICD-10-CM | POA: Diagnosis not present

## 2021-11-04 DIAGNOSIS — Z806 Family history of leukemia: Secondary | ICD-10-CM | POA: Diagnosis not present

## 2021-11-04 DIAGNOSIS — L8990 Pressure ulcer of unspecified site, unspecified stage: Secondary | ICD-10-CM | POA: Insufficient documentation

## 2021-11-04 DIAGNOSIS — J9690 Respiratory failure, unspecified, unspecified whether with hypoxia or hypercapnia: Secondary | ICD-10-CM

## 2021-11-04 DIAGNOSIS — J9622 Acute and chronic respiratory failure with hypercapnia: Secondary | ICD-10-CM | POA: Diagnosis not present

## 2021-11-04 DIAGNOSIS — J1282 Pneumonia due to coronavirus disease 2019: Secondary | ICD-10-CM | POA: Diagnosis present

## 2021-11-04 DIAGNOSIS — I10 Essential (primary) hypertension: Secondary | ICD-10-CM | POA: Diagnosis not present

## 2021-11-04 DIAGNOSIS — F419 Anxiety disorder, unspecified: Secondary | ICD-10-CM | POA: Diagnosis not present

## 2021-11-04 DIAGNOSIS — L89301 Pressure ulcer of unspecified buttock, stage 1: Secondary | ICD-10-CM | POA: Diagnosis present

## 2021-11-04 DIAGNOSIS — A419 Sepsis, unspecified organism: Secondary | ICD-10-CM | POA: Diagnosis not present

## 2021-11-04 DIAGNOSIS — J449 Chronic obstructive pulmonary disease, unspecified: Secondary | ICD-10-CM | POA: Diagnosis not present

## 2021-11-04 DIAGNOSIS — R918 Other nonspecific abnormal finding of lung field: Secondary | ICD-10-CM | POA: Diagnosis not present

## 2021-11-04 DIAGNOSIS — C9110 Chronic lymphocytic leukemia of B-cell type not having achieved remission: Secondary | ICD-10-CM | POA: Diagnosis not present

## 2021-11-04 DIAGNOSIS — Z930 Tracheostomy status: Secondary | ICD-10-CM | POA: Diagnosis not present

## 2021-11-04 DIAGNOSIS — Z0189 Encounter for other specified special examinations: Secondary | ICD-10-CM

## 2021-11-04 DIAGNOSIS — R609 Edema, unspecified: Secondary | ICD-10-CM | POA: Diagnosis not present

## 2021-11-04 DIAGNOSIS — J156 Pneumonia due to other aerobic Gram-negative bacteria: Secondary | ICD-10-CM | POA: Diagnosis not present

## 2021-11-04 DIAGNOSIS — U071 COVID-19: Secondary | ICD-10-CM | POA: Diagnosis not present

## 2021-11-04 DIAGNOSIS — N390 Urinary tract infection, site not specified: Secondary | ICD-10-CM | POA: Diagnosis not present

## 2021-11-04 DIAGNOSIS — E669 Obesity, unspecified: Secondary | ICD-10-CM | POA: Diagnosis not present

## 2021-11-04 DIAGNOSIS — J9620 Acute and chronic respiratory failure, unspecified whether with hypoxia or hypercapnia: Secondary | ICD-10-CM | POA: Diagnosis not present

## 2021-11-04 DIAGNOSIS — Z823 Family history of stroke: Secondary | ICD-10-CM | POA: Diagnosis not present

## 2021-11-04 DIAGNOSIS — J439 Emphysema, unspecified: Secondary | ICD-10-CM | POA: Diagnosis present

## 2021-11-04 DIAGNOSIS — Z7951 Long term (current) use of inhaled steroids: Secondary | ICD-10-CM

## 2021-11-04 DIAGNOSIS — R000 Tachycardia, unspecified: Secondary | ICD-10-CM | POA: Diagnosis not present

## 2021-11-04 DIAGNOSIS — R0902 Hypoxemia: Secondary | ICD-10-CM

## 2021-11-04 DIAGNOSIS — Z87891 Personal history of nicotine dependence: Secondary | ICD-10-CM

## 2021-11-04 DIAGNOSIS — K219 Gastro-esophageal reflux disease without esophagitis: Secondary | ICD-10-CM | POA: Diagnosis present

## 2021-11-04 DIAGNOSIS — J159 Unspecified bacterial pneumonia: Secondary | ICD-10-CM | POA: Diagnosis present

## 2021-11-04 DIAGNOSIS — Z87898 Personal history of other specified conditions: Secondary | ICD-10-CM

## 2021-11-04 DIAGNOSIS — Z8616 Personal history of COVID-19: Secondary | ICD-10-CM | POA: Diagnosis not present

## 2021-11-04 DIAGNOSIS — Z79899 Other long term (current) drug therapy: Secondary | ICD-10-CM | POA: Diagnosis not present

## 2021-11-04 DIAGNOSIS — N289 Disorder of kidney and ureter, unspecified: Secondary | ICD-10-CM | POA: Diagnosis present

## 2021-11-04 DIAGNOSIS — G931 Anoxic brain damage, not elsewhere classified: Secondary | ICD-10-CM | POA: Diagnosis present

## 2021-11-04 DIAGNOSIS — D638 Anemia in other chronic diseases classified elsewhere: Secondary | ICD-10-CM | POA: Diagnosis not present

## 2021-11-04 DIAGNOSIS — Z4659 Encounter for fitting and adjustment of other gastrointestinal appliance and device: Secondary | ICD-10-CM

## 2021-11-04 DIAGNOSIS — I700 Atherosclerosis of aorta: Secondary | ICD-10-CM | POA: Diagnosis not present

## 2021-11-04 DIAGNOSIS — Z4682 Encounter for fitting and adjustment of non-vascular catheter: Secondary | ICD-10-CM | POA: Diagnosis not present

## 2021-11-04 DIAGNOSIS — C9111 Chronic lymphocytic leukemia of B-cell type in remission: Secondary | ICD-10-CM | POA: Diagnosis not present

## 2021-11-04 DIAGNOSIS — J189 Pneumonia, unspecified organism: Secondary | ICD-10-CM | POA: Diagnosis not present

## 2021-11-04 DIAGNOSIS — Z6829 Body mass index (BMI) 29.0-29.9, adult: Secondary | ICD-10-CM

## 2021-11-04 DIAGNOSIS — Z8249 Family history of ischemic heart disease and other diseases of the circulatory system: Secondary | ICD-10-CM

## 2021-11-04 DIAGNOSIS — E43 Unspecified severe protein-calorie malnutrition: Secondary | ICD-10-CM | POA: Diagnosis not present

## 2021-11-04 DIAGNOSIS — J90 Pleural effusion, not elsewhere classified: Secondary | ICD-10-CM | POA: Diagnosis not present

## 2021-11-04 DIAGNOSIS — G4700 Insomnia, unspecified: Secondary | ICD-10-CM | POA: Diagnosis present

## 2021-11-04 DIAGNOSIS — J9601 Acute respiratory failure with hypoxia: Secondary | ICD-10-CM | POA: Diagnosis not present

## 2021-11-04 DIAGNOSIS — E785 Hyperlipidemia, unspecified: Secondary | ICD-10-CM | POA: Diagnosis present

## 2021-11-04 DIAGNOSIS — Z66 Do not resuscitate: Secondary | ICD-10-CM | POA: Diagnosis not present

## 2021-11-04 DIAGNOSIS — R404 Transient alteration of awareness: Secondary | ICD-10-CM | POA: Diagnosis not present

## 2021-11-04 DIAGNOSIS — A411 Sepsis due to other specified staphylococcus: Secondary | ICD-10-CM | POA: Diagnosis not present

## 2021-11-04 DIAGNOSIS — J984 Other disorders of lung: Secondary | ICD-10-CM | POA: Diagnosis not present

## 2021-11-04 DIAGNOSIS — T4275XA Adverse effect of unspecified antiepileptic and sedative-hypnotic drugs, initial encounter: Secondary | ICD-10-CM | POA: Diagnosis not present

## 2021-11-04 DIAGNOSIS — Z9911 Dependence on respirator [ventilator] status: Secondary | ICD-10-CM | POA: Diagnosis not present

## 2021-11-04 DIAGNOSIS — Z7952 Long term (current) use of systemic steroids: Secondary | ICD-10-CM

## 2021-11-04 DIAGNOSIS — Z978 Presence of other specified devices: Secondary | ICD-10-CM

## 2021-11-04 DIAGNOSIS — Z885 Allergy status to narcotic agent status: Secondary | ICD-10-CM

## 2021-11-04 DIAGNOSIS — R6520 Severe sepsis without septic shock: Secondary | ICD-10-CM | POA: Diagnosis not present

## 2021-11-04 DIAGNOSIS — Z7969 Long term (current) use of other immunomodulators and immunosuppressants: Secondary | ICD-10-CM

## 2021-11-04 DIAGNOSIS — Z452 Encounter for adjustment and management of vascular access device: Secondary | ICD-10-CM | POA: Diagnosis not present

## 2021-11-04 DIAGNOSIS — A4189 Other specified sepsis: Secondary | ICD-10-CM | POA: Diagnosis not present

## 2021-11-04 DIAGNOSIS — R6889 Other general symptoms and signs: Secondary | ICD-10-CM | POA: Diagnosis not present

## 2021-11-04 DIAGNOSIS — Z888 Allergy status to other drugs, medicaments and biological substances status: Secondary | ICD-10-CM

## 2021-11-04 DIAGNOSIS — Z1621 Resistance to vancomycin: Secondary | ICD-10-CM | POA: Diagnosis not present

## 2021-11-04 DIAGNOSIS — D61818 Other pancytopenia: Secondary | ICD-10-CM | POA: Diagnosis not present

## 2021-11-04 DIAGNOSIS — D6489 Other specified anemias: Secondary | ICD-10-CM | POA: Diagnosis present

## 2021-11-04 DIAGNOSIS — I5032 Chronic diastolic (congestive) heart failure: Secondary | ICD-10-CM | POA: Diagnosis not present

## 2021-11-04 DIAGNOSIS — Z743 Need for continuous supervision: Secondary | ICD-10-CM | POA: Diagnosis not present

## 2021-11-04 LAB — COMPREHENSIVE METABOLIC PANEL
ALT: 14 U/L (ref 0–44)
AST: 27 U/L (ref 15–41)
Albumin: 3.2 g/dL — ABNORMAL LOW (ref 3.5–5.0)
Alkaline Phosphatase: 83 U/L (ref 38–126)
Anion gap: 12 (ref 5–15)
BUN: 14 mg/dL (ref 8–23)
CO2: 39 mmol/L — ABNORMAL HIGH (ref 22–32)
Calcium: 8.2 mg/dL — ABNORMAL LOW (ref 8.9–10.3)
Chloride: 85 mmol/L — ABNORMAL LOW (ref 98–111)
Creatinine, Ser: 1.01 mg/dL (ref 0.61–1.24)
GFR, Estimated: >60 mL/min (ref >60)
Glucose, Bld: 195 mg/dL — ABNORMAL HIGH (ref 70–99)
Potassium: 4.4 mmol/L (ref 3.5–5.1)
Sodium: 136 mmol/L (ref 135–145)
Total Bilirubin: 0.8 mg/dL (ref 0.3–1.2)
Total Protein: 6.4 g/dL — ABNORMAL LOW (ref 6.5–8.1)

## 2021-11-04 LAB — CBC WITH DIFFERENTIAL/PLATELET
Abs Immature Granulocytes: 0.44 10*3/uL — ABNORMAL HIGH (ref 0.00–0.07)
Basophils Absolute: 0.1 10*3/uL (ref 0.0–0.1)
Basophils Relative: 0 %
Eosinophils Absolute: 0 10*3/uL (ref 0.0–0.5)
Eosinophils Relative: 0 %
HCT: 45.9 % (ref 39.0–52.0)
Hemoglobin: 13 g/dL (ref 13.0–17.0)
Immature Granulocytes: 1 %
Lymphocytes Relative: 90 %
Lymphs Abs: 81.9 10*3/uL — ABNORMAL HIGH (ref 0.7–4.0)
MCH: 25.8 pg — ABNORMAL LOW (ref 26.0–34.0)
MCHC: 28.3 g/dL — ABNORMAL LOW (ref 30.0–36.0)
MCV: 91.3 fL (ref 80.0–100.0)
Monocytes Absolute: 0.9 10*3/uL (ref 0.1–1.0)
Monocytes Relative: 1 %
Neutro Abs: 7.4 10*3/uL (ref 1.7–7.7)
Neutrophils Relative %: 8 %
Platelets: 242 10*3/uL (ref 150–400)
RBC: 5.03 MIL/uL (ref 4.22–5.81)
RDW: 14.4 % (ref 11.5–15.5)
WBC: 90.7 10*3/uL (ref 4.0–10.5)
nRBC: 0 % (ref 0.0–0.2)

## 2021-11-04 LAB — BASIC METABOLIC PANEL
Anion gap: 11 (ref 5–15)
BUN: 17 mg/dL (ref 8–23)
CO2: 40 mmol/L — ABNORMAL HIGH (ref 22–32)
Calcium: 7.7 mg/dL — ABNORMAL LOW (ref 8.9–10.3)
Chloride: 85 mmol/L — ABNORMAL LOW (ref 98–111)
Creatinine, Ser: 1.27 mg/dL — ABNORMAL HIGH (ref 0.61–1.24)
GFR, Estimated: >60 mL/min (ref >60)
Glucose, Bld: 234 mg/dL — ABNORMAL HIGH (ref 70–99)
Potassium: 4.1 mmol/L (ref 3.5–5.1)
Sodium: 136 mmol/L (ref 135–145)

## 2021-11-04 LAB — URINALYSIS, ROUTINE W REFLEX MICROSCOPIC
Bilirubin Urine: NEGATIVE
Glucose, UA: NEGATIVE mg/dL
Hgb urine dipstick: NEGATIVE
Ketones, ur: NEGATIVE mg/dL
Leukocytes,Ua: NEGATIVE
Nitrite: NEGATIVE
Protein, ur: >=300 mg/dL — AB
Specific Gravity, Urine: 1.024 (ref 1.005–1.030)
pH: 5 (ref 5.0–8.0)

## 2021-11-04 LAB — POCT I-STAT 7, (LYTES, BLD GAS, ICA,H+H)
Acid-Base Excess: 14 mmol/L — ABNORMAL HIGH (ref 0.0–2.0)
Bicarbonate: 40.3 mmol/L — ABNORMAL HIGH (ref 20.0–28.0)
Calcium, Ion: 1.06 mmol/L — ABNORMAL LOW (ref 1.15–1.40)
HCT: 36 % — ABNORMAL LOW (ref 39.0–52.0)
Hemoglobin: 12.2 g/dL — ABNORMAL LOW (ref 13.0–17.0)
O2 Saturation: 96 %
Patient temperature: 98
Potassium: 3.5 mmol/L (ref 3.5–5.1)
Sodium: 135 mmol/L (ref 135–145)
TCO2: 42 mmol/L — ABNORMAL HIGH (ref 22–32)
pCO2 arterial: 57.4 mmHg — ABNORMAL HIGH (ref 32.0–48.0)
pH, Arterial: 7.453 — ABNORMAL HIGH (ref 7.350–7.450)
pO2, Arterial: 81 mmHg — ABNORMAL LOW (ref 83.0–108.0)

## 2021-11-04 LAB — CREATININE, SERUM
Creatinine, Ser: 1.33 mg/dL — ABNORMAL HIGH (ref 0.61–1.24)
GFR, Estimated: 59 mL/min — ABNORMAL LOW (ref >60)

## 2021-11-04 LAB — BLOOD GAS, VENOUS
Acid-Base Excess: 11.6 mmol/L — ABNORMAL HIGH (ref 0.0–2.0)
Bicarbonate: 36.3 mmol/L — ABNORMAL HIGH (ref 20.0–28.0)
Drawn by: 164
FIO2: 40
O2 Saturation: 75.4 %
Patient temperature: 37
pCO2, Ven: 53.8 mmHg (ref 44.0–60.0)
pH, Ven: 7.444 — ABNORMAL HIGH (ref 7.250–7.430)
pO2, Ven: 39.5 mmHg (ref 32.0–45.0)

## 2021-11-04 LAB — GLUCOSE, CAPILLARY
Glucose-Capillary: 113 mg/dL — ABNORMAL HIGH (ref 70–99)
Glucose-Capillary: 120 mg/dL — ABNORMAL HIGH (ref 70–99)
Glucose-Capillary: 121 mg/dL — ABNORMAL HIGH (ref 70–99)
Glucose-Capillary: 145 mg/dL — ABNORMAL HIGH (ref 70–99)
Glucose-Capillary: 169 mg/dL — ABNORMAL HIGH (ref 70–99)
Glucose-Capillary: 173 mg/dL — ABNORMAL HIGH (ref 70–99)

## 2021-11-04 LAB — RESP PANEL BY RT-PCR (FLU A&B, COVID) ARPGX2
Influenza A by PCR: NEGATIVE
Influenza B by PCR: NEGATIVE
SARS Coronavirus 2 by RT PCR: POSITIVE — AB

## 2021-11-04 LAB — I-STAT VENOUS BLOOD GAS, ED
Acid-Base Excess: 15 mmol/L — ABNORMAL HIGH (ref 0.0–2.0)
Bicarbonate: 46.5 mmol/L — ABNORMAL HIGH (ref 20.0–28.0)
Calcium, Ion: 0.99 mmol/L — ABNORMAL LOW (ref 1.15–1.40)
HCT: 45 % (ref 39.0–52.0)
Hemoglobin: 15.3 g/dL (ref 13.0–17.0)
O2 Saturation: 99 %
Potassium: 4.1 mmol/L (ref 3.5–5.1)
Sodium: 136 mmol/L (ref 135–145)
TCO2: 49 mmol/L — ABNORMAL HIGH (ref 22–32)
pCO2, Ven: 93.8 mmHg (ref 44.0–60.0)
pH, Ven: 7.303 (ref 7.250–7.430)
pO2, Ven: 161 mmHg — ABNORMAL HIGH (ref 32.0–45.0)

## 2021-11-04 LAB — CBC
HCT: 36.9 % — ABNORMAL LOW (ref 39.0–52.0)
HCT: 37.9 % — ABNORMAL LOW (ref 39.0–52.0)
Hemoglobin: 10.8 g/dL — ABNORMAL LOW (ref 13.0–17.0)
Hemoglobin: 11.1 g/dL — ABNORMAL LOW (ref 13.0–17.0)
MCH: 26.4 pg (ref 26.0–34.0)
MCH: 25.9 pg — ABNORMAL LOW (ref 26.0–34.0)
MCHC: 29.3 g/dL — ABNORMAL LOW (ref 30.0–36.0)
MCHC: 29.3 g/dL — ABNORMAL LOW (ref 30.0–36.0)
MCV: 90.2 fL (ref 80.0–100.0)
MCV: 88.6 fL (ref 80.0–100.0)
Platelets: 192 10*3/uL (ref 150–400)
Platelets: 222 10*3/uL (ref 150–400)
RBC: 4.09 MIL/uL — ABNORMAL LOW (ref 4.22–5.81)
RBC: 4.28 MIL/uL (ref 4.22–5.81)
RDW: 14.3 % (ref 11.5–15.5)
RDW: 14.5 % (ref 11.5–15.5)
WBC: 51.5 10*3/uL (ref 4.0–10.5)
WBC: 70.6 10*3/uL (ref 4.0–10.5)
nRBC: 0 % (ref 0.0–0.2)
nRBC: 0 % (ref 0.0–0.2)

## 2021-11-04 LAB — LACTIC ACID, PLASMA
Lactic Acid, Venous: 1 mmol/L (ref 0.5–1.9)
Lactic Acid, Venous: 1.8 mmol/L (ref 0.5–1.9)

## 2021-11-04 LAB — PHOSPHORUS: Phosphorus: 3.2 mg/dL (ref 2.5–4.6)

## 2021-11-04 LAB — MRSA NEXT GEN BY PCR, NASAL: MRSA by PCR Next Gen: NOT DETECTED

## 2021-11-04 LAB — PROTIME-INR
INR: 1.1 (ref 0.8–1.2)
Prothrombin Time: 13.8 seconds (ref 11.4–15.2)

## 2021-11-04 LAB — MAGNESIUM: Magnesium: 1.9 mg/dL (ref 1.7–2.4)

## 2021-11-04 LAB — APTT: aPTT: 29 seconds (ref 24–36)

## 2021-11-04 MED ORDER — ENOXAPARIN SODIUM 40 MG/0.4ML IJ SOSY
40.0000 mg | PREFILLED_SYRINGE | INTRAMUSCULAR | Status: DC
  Administered 2021-11-04 – 2021-11-15 (×11): 40 mg via SUBCUTANEOUS
  Filled 2021-11-04 (×12): qty 0.4

## 2021-11-04 MED ORDER — INSULIN ASPART 100 UNIT/ML IJ SOLN
2.0000 [IU] | INTRAMUSCULAR | Status: DC
Start: 2021-11-04 — End: 2021-11-15
  Administered 2021-11-04 – 2021-11-05 (×6): 2 [IU] via SUBCUTANEOUS
  Administered 2021-11-06 – 2021-11-07 (×2): 4 [IU] via SUBCUTANEOUS
  Administered 2021-11-07 – 2021-11-08 (×3): 2 [IU] via SUBCUTANEOUS
  Administered 2021-11-08 (×2): 4 [IU] via SUBCUTANEOUS
  Administered 2021-11-08: 04:00:00 2 [IU] via SUBCUTANEOUS
  Administered 2021-11-08: 21:00:00 4 [IU] via SUBCUTANEOUS
  Administered 2021-11-09 – 2021-11-12 (×4): 2 [IU] via SUBCUTANEOUS
  Administered 2021-11-12: 12:00:00 4 [IU] via SUBCUTANEOUS
  Administered 2021-11-12: 20:00:00 2 [IU] via SUBCUTANEOUS
  Administered 2021-11-12: 16:00:00 6 [IU] via SUBCUTANEOUS
  Administered 2021-11-12: 04:00:00 2 [IU] via SUBCUTANEOUS
  Administered 2021-11-12: 4 [IU] via SUBCUTANEOUS
  Administered 2021-11-13: 21:00:00 2 [IU] via SUBCUTANEOUS
  Administered 2021-11-13: 12:00:00 4 [IU] via SUBCUTANEOUS
  Administered 2021-11-13: 09:00:00 2 [IU] via SUBCUTANEOUS
  Administered 2021-11-13: 15:00:00 4 [IU] via SUBCUTANEOUS
  Administered 2021-11-14 – 2021-11-15 (×7): 2 [IU] via SUBCUTANEOUS

## 2021-11-04 MED ORDER — CHLORHEXIDINE GLUCONATE CLOTH 2 % EX PADS
6.0000 | MEDICATED_PAD | Freq: Every day | CUTANEOUS | Status: DC
  Administered 2021-11-04 – 2021-11-14 (×9): 6 via TOPICAL

## 2021-11-04 MED ORDER — NOREPINEPHRINE 4 MG/250ML-% IV SOLN
2.0000 ug/min | INTRAVENOUS | Status: DC
  Administered 2021-11-04: 3 ug/min via INTRAVENOUS

## 2021-11-04 MED ORDER — IPRATROPIUM-ALBUTEROL 0.5-2.5 (3) MG/3ML IN SOLN
3.0000 mL | RESPIRATORY_TRACT | Status: DC
Start: 2021-11-05 — End: 2021-11-06
  Administered 2021-11-05 – 2021-11-06 (×9): 3 mL via RESPIRATORY_TRACT
  Filled 2021-11-04 (×9): qty 3

## 2021-11-04 MED ORDER — ORAL CARE MOUTH RINSE
15.0000 mL | OROMUCOSAL | Status: DC
  Administered 2021-11-04 – 2021-11-15 (×107): 15 mL via OROMUCOSAL

## 2021-11-04 MED ORDER — POTASSIUM CHLORIDE 10 MEQ/100ML IV SOLN
10.0000 meq | INTRAVENOUS | Status: AC
  Administered 2021-11-04 (×2): 10 meq via INTRAVENOUS
  Filled 2021-11-04 (×2): qty 100

## 2021-11-04 MED ORDER — LACTATED RINGERS IV SOLN: INTRAVENOUS | Status: AC

## 2021-11-04 MED ORDER — DEXAMETHASONE SODIUM PHOSPHATE 4 MG/ML IJ SOLN
4.0000 mg | Freq: Two times a day (BID) | INTRAMUSCULAR | Status: DC
  Administered 2021-11-04 – 2021-11-05 (×4): 4 mg via INTRAVENOUS
  Filled 2021-11-04 (×4): qty 1

## 2021-11-04 MED ORDER — CHLORHEXIDINE GLUCONATE 0.12% ORAL RINSE (MEDLINE KIT)
15.0000 mL | Freq: Two times a day (BID) | OROMUCOSAL | Status: DC
  Administered 2021-11-04 – 2021-11-15 (×23): 15 mL via OROMUCOSAL

## 2021-11-04 MED ORDER — LACTATED RINGERS IV BOLUS (SEPSIS)
1000.0000 mL | Freq: Once | INTRAVENOUS | Status: AC
  Administered 2021-11-04: 1000 mL via INTRAVENOUS

## 2021-11-04 MED ORDER — SODIUM CHLORIDE 0.9 % IV SOLN
250.0000 mL | INTRAVENOUS | Status: DC
  Administered 2021-11-04 – 2021-11-07 (×2): 250 mL via INTRAVENOUS

## 2021-11-04 MED ORDER — ETOMIDATE 2 MG/ML IV SOLN
INTRAVENOUS | Status: DC | PRN
  Administered 2021-11-04: 20 mg via INTRAVENOUS

## 2021-11-04 MED ORDER — FENTANYL CITRATE (PF) 100 MCG/2ML IJ SOLN
25.0000 ug | Freq: Once | INTRAMUSCULAR | Status: AC
  Administered 2021-11-04: 25 ug via INTRAVENOUS
  Filled 2021-11-04: qty 2

## 2021-11-04 MED ORDER — IPRATROPIUM-ALBUTEROL 20-100 MCG/ACT IN AERS
1.0000 | INHALATION_SPRAY | Freq: Four times a day (QID) | RESPIRATORY_TRACT | Status: DC | PRN
  Filled 2021-11-04: qty 4

## 2021-11-04 MED ORDER — PROPOFOL 1000 MG/100ML IV EMUL
INTRAVENOUS | Status: AC
  Filled 2021-11-04: qty 100

## 2021-11-04 MED ORDER — POLYETHYLENE GLYCOL 3350 17 G PO PACK: 17.0000 g | PACK | Freq: Every day | ORAL | Status: DC | PRN

## 2021-11-04 MED ORDER — SODIUM CHLORIDE 0.9 % IV SOLN
100.0000 mg | Freq: Every day | INTRAVENOUS | Status: AC
  Administered 2021-11-05 – 2021-11-08 (×4): 100 mg via INTRAVENOUS
  Filled 2021-11-04: qty 20
  Filled 2021-11-04: qty 100
  Filled 2021-11-04 (×2): qty 20

## 2021-11-04 MED ORDER — POLYETHYLENE GLYCOL 3350 17 G PO PACK
17.0000 g | PACK | Freq: Every day | ORAL | Status: DC
  Filled 2021-11-04: qty 1

## 2021-11-04 MED ORDER — MIDAZOLAM HCL 2 MG/2ML IJ SOLN
1.0000 mg | INTRAMUSCULAR | Status: DC | PRN
  Administered 2021-11-04 – 2021-11-06 (×5): 1 mg via INTRAVENOUS
  Filled 2021-11-04 (×5): qty 2

## 2021-11-04 MED ORDER — MIDAZOLAM HCL 2 MG/2ML IJ SOLN
1.0000 mg | INTRAMUSCULAR | Status: AC | PRN
  Administered 2021-11-04 – 2021-11-05 (×3): 1 mg via INTRAVENOUS
  Filled 2021-11-04 (×3): qty 2

## 2021-11-04 MED ORDER — SODIUM CHLORIDE 0.9 % IV SOLN: 250.0000 mL | INTRAVENOUS | Status: DC

## 2021-11-04 MED ORDER — FENTANYL CITRATE (PF) 100 MCG/2ML IJ SOLN
25.0000 ug | INTRAMUSCULAR | Status: DC | PRN
  Administered 2021-11-04 (×2): 100 ug via INTRAVENOUS
  Administered 2021-11-04: 50 ug via INTRAVENOUS
  Filled 2021-11-04 (×3): qty 2

## 2021-11-04 MED ORDER — DOCUSATE SODIUM 100 MG PO CAPS: 100.0000 mg | ORAL_CAPSULE | Freq: Two times a day (BID) | ORAL | Status: DC | PRN

## 2021-11-04 MED ORDER — FENTANYL BOLUS VIA INFUSION
25.0000 ug | INTRAVENOUS | Status: DC | PRN
  Administered 2021-11-05: 75 ug via INTRAVENOUS
  Filled 2021-11-04: qty 100

## 2021-11-04 MED ORDER — ONDANSETRON HCL 4 MG/2ML IJ SOLN: 4.0000 mg | Freq: Four times a day (QID) | INTRAMUSCULAR | Status: DC | PRN

## 2021-11-04 MED ORDER — DOCUSATE SODIUM 50 MG/5ML PO LIQD: 100.0000 mg | Freq: Two times a day (BID) | ORAL | Status: DC

## 2021-11-04 MED ORDER — SODIUM CHLORIDE 0.9 % IV SOLN
200.0000 mg | Freq: Once | INTRAVENOUS | Status: AC
  Administered 2021-11-04: 200 mg via INTRAVENOUS
  Filled 2021-11-04: qty 40

## 2021-11-04 MED ORDER — FENTANYL CITRATE (PF) 100 MCG/2ML IJ SOLN: 25.0000 ug | INTRAMUSCULAR | Status: DC | PRN

## 2021-11-04 MED ORDER — NOREPINEPHRINE 4 MG/250ML-% IV SOLN
2.0000 ug/min | INTRAVENOUS | Status: DC
  Administered 2021-11-04: 2 ug/min via INTRAVENOUS
  Filled 2021-11-04: qty 250

## 2021-11-04 MED ORDER — SODIUM CHLORIDE 0.9 % IV SOLN
500.0000 mg | INTRAVENOUS | Status: AC
  Administered 2021-11-04 – 2021-11-08 (×5): 500 mg via INTRAVENOUS
  Filled 2021-11-04 (×5): qty 5

## 2021-11-04 MED ORDER — SODIUM CHLORIDE 0.9 % IV SOLN
2.0000 g | INTRAVENOUS | Status: AC
  Administered 2021-11-04 – 2021-11-08 (×5): 2 g via INTRAVENOUS
  Filled 2021-11-04 (×5): qty 20

## 2021-11-04 MED ORDER — DOCUSATE SODIUM 50 MG/5ML PO LIQD
100.0000 mg | Freq: Two times a day (BID) | ORAL | Status: DC
  Administered 2021-11-04: 100 mg
  Filled 2021-11-04 (×3): qty 10

## 2021-11-04 MED ORDER — POLYETHYLENE GLYCOL 3350 17 G PO PACK: 17.0000 g | PACK | Freq: Every day | ORAL | Status: DC

## 2021-11-04 MED ORDER — IPRATROPIUM-ALBUTEROL 0.5-2.5 (3) MG/3ML IN SOLN: 3.0000 mL | RESPIRATORY_TRACT | Status: DC | PRN

## 2021-11-04 MED ORDER — SUCCINYLCHOLINE CHLORIDE 20 MG/ML IJ SOLN
INTRAMUSCULAR | Status: DC | PRN
  Administered 2021-11-04: 100 mg via INTRAVENOUS

## 2021-11-04 MED ORDER — FENTANYL 2500MCG IN NS 250ML (10MCG/ML) PREMIX INFUSION
25.0000 ug/h | INTRAVENOUS | Status: DC
  Administered 2021-11-04: 50 ug/h via INTRAVENOUS
  Administered 2021-11-05: 150 ug/h via INTRAVENOUS
  Filled 2021-11-04 (×2): qty 250

## 2021-11-04 MED ORDER — PANTOPRAZOLE SODIUM 40 MG IV SOLR
40.0000 mg | Freq: Every day | INTRAVENOUS | Status: DC
  Administered 2021-11-04 – 2021-11-06 (×3): 40 mg via INTRAVENOUS
  Filled 2021-11-04 (×3): qty 40

## 2021-11-04 NOTE — ED Provider Notes (Signed)
[facility] EMERGENCY DEPARTMENT Provider Note   CSN: [id number] Arrival date & time: 11/04/21  0155     History Chief Complaint  Patient presents with   Respiratory Distress   Level 5 caveat due to respiratory distress Isaac [name] is a 65 y.o. male.  The history is provided by the EMS personnel. The history is limited by the condition of the patient.  Shortness of Breath Severity:  Severe Onset quality:  Sudden Timing:  Constant Progression:  Worsening Chronicity:  New Relieved by:  Nothing Worsened by:  Nothing Patient with history of chronic respiratory failure, CLL, COPD presents with respiratory distress.  Patient was brought in by EMS for.  It is reported the patient had increasing shortness of breath and confusion for the previous 40 minutes.  EMS reports his respiratory effort worsened and he required bagging.  It is reported the patient is also COVID-positive No other details are known on arrival    Past Medical History:  Diagnosis Date   Acute on chronic respiratory failure with hypoxia (HCC)    Anxiety    Chronic lymphocytic leukemia in remission (HCC)    COPD (chronic obstructive pulmonary disease) (HCC)    COPD, severe (HCC)    GERD (gastroesophageal reflux disease)    Hematuria    Hyperlipidemia    Hypertension    Insomnia    Left lower lobe pneumonia    Nephrolithiasis    Tobacco dependence     Patient Active Problem List   Diagnosis Date Noted   Respiratory arrest ([place]) 08/21/2021   Wound infection after surgery 08/21/2021   Hypotension 08/21/2021   Acute on chronic respiratory failure with hypoxia and hypercapnia ([place]) 08/21/2021   Volume overload 08/21/2021   Acute on chronic respiratory failure ([place]) 03/04/2021   HCAP (healthcare-associated pneumonia) 03/04/2021   Acute on chronic respiratory failure with hypoxia (HCC)    COPD, severe (HCC)    Left lower lobe pneumonia    Chronic lymphocytic leukemia in remission  ([place])    CLL (chronic lymphocytic leukemia) ([place])    Acute respiratory failure with hypercapnia ([place]) 12/08/2020   Hyperkalemia    Leukocytosis    AKI (acute kidney injury) ([place])    Healthcare maintenance 08/12/2020   Oral thrush 04/30/2017   Dysfunction of right eustachian tube 01/19/2016   Left ear impacted cerumen 01/19/2016   COPD, very severe ([place]) 08/22/2015   Chronic respiratory failure with hypercapnia ([place]) 06/08/2014   Unspecified sleep apnea 06/08/2014   Chronic diastolic heart failure ([place]) 05/11/2014   Acute-on-chronic respiratory failure ([place]) 04/24/2014   Sinusitis 04/13/2014   Renal insufficiency 07/07/2012   Hypertension 07/07/2012   COPD exacerbation ([place]) 07/04/2012   COPD, severe ([place]) 04/25/2012   Sleep apnea 04/25/2012   Obesity 04/25/2012    Past Surgical History:  Procedure Laterality Date   LITHOTRIPSY         Family History  Problem Relation Age of Onset   Heart disease Father    Stroke Mother    Leukemia Brother     Social History   Tobacco Use   Smoking status: Former    Packs/day: 1.00    Years: 35.00    Pack years: 35.00    Types: Cigarettes    Quit date: 12/07/2008    Years since quitting: 12.9   Smokeless tobacco: Never  Vaping Use   Vaping Use: Never used    Home Medications Prior to Admission medications   Medication Sig Start Date End Date  Taking? Authorizing Provider  albuterol (PROVENTIL) (2.5 MG/3ML) 0.083% nebulizer solution Take 3 mLs (2.5 mg total) by nebulization every 4 (four) hours as needed for wheezing or shortness of breath. Patient taking differently: Take 2.5 mg by nebulization every 4 (four) hours. 12/19/20   Donita Brooks, NP  albuterol (VENTOLIN HFA) 108 (90 Base) MCG/ACT inhaler INHALE 2 PUFFS INTO THE LUNGS EVERY 4 (FOUR) HOURS AS NEEDED FOR WHEEZING OR SHORTNESS OF BREATH. Patient taking differently: Inhale 2 puffs into the lungs every 4 (four) hours. 05/31/21   Brand Males, MD  allopurinol  (ZYLOPRIM) 100 MG tablet Place 1 tablet (100 mg total) into feeding tube daily. 04/07/21   Wyatt Portela, MD  benzonatate (TESSALON) 200 MG capsule Take 200 mg by mouth 3 (three) times daily as needed. 08/03/21   [provider]  budesonide (PULMICORT) 0.5 MG/2ML nebulizer solution TAKE 2 ML BY NEBULIZATION 2 TIMES DAILY. 07/25/21   Brand Males, MD  cloNIDine (CATAPRES - DOSED IN MG/24 HR) 0.1 mg/24hr patch Place 0.1 mg onto the skin once a week. Tuesdays 03/02/21   [provider]  docusate sodium (COLACE) 100 MG capsule Take 100 mg by mouth daily.    [provider]  doxycycline (VIBRA-TABS) 100 MG tablet Take 1 tablet (100 mg total) by mouth 2 (two) times daily. 10/12/21   Brand Males, MD  Ensure (ENSURE) Take 237 mLs by mouth daily. Chocolate flavor    [provider]  furosemide (LASIX) 40 MG tablet Take 40 mg by mouth daily. 03/02/21   [provider]  gabapentin (NEURONTIN) 100 MG capsule Take 100 mg by mouth daily. 09/25/21   [provider]  guaiFENesin (MUCINEX) 600 MG 12 hr tablet Take 600 mg by mouth daily. Patient not taking: Reported on 09/25/2021 06/23/12   [provider]  hydrOXYzine (VISTARIL) 25 MG capsule Take 50 mg by mouth at bedtime. 07/18/21   [provider]  ibrutinib (IMBRUVICA) 420 MG TABS Take 1 tablet (420 mg) by mouth daily. 07/25/21   Wyatt Portela, MD  ipratropium-albuterol (DUONEB) 0.5-2.5 (3) MG/3ML SOLN TAKE 3 MLS (ONE VIAL) BY NEBULIZATION EVERY 6 (SIX) HOURS AS NEEDED. Patient taking differently: Inhale 3 mLs into the lungs every 4 (four) hours. 06/14/21   Brand Males, MD  loratadine (CLARITIN) 10 MG tablet Take 10 mg by mouth daily.    [provider]  LORazepam (ATIVAN) 0.5 MG tablet Take 0.5 mg by mouth 2 (two) times daily as needed. 08/29/21   [provider]  metoprolol tartrate (LOPRESSOR) 25 MG tablet Take 25 mg by mouth 2 (two) times daily. 01/26/21    [provider]  montelukast (SINGULAIR) 10 MG tablet Take 10 mg by mouth every evening.    [provider]  morphine (MSIR) 15 MG tablet Take 7.5 mg by mouth every 6 (six) hours as needed. Patient not taking: Reported on 09/25/2021 09/06/21   [provider]  pantoprazole (PROTONIX) 40 MG tablet Take 40 mg by mouth at bedtime. 03/02/21   [provider]  polyethylene glycol (MIRALAX / GLYCOLAX) 17 g packet Place 17 g into feeding tube daily as needed for moderate constipation. Patient not taking: Reported on 09/25/2021 12/19/20   Donita Brooks, NP  predniSONE (DELTASONE) 20 MG tablet Take 1 tablet (20 mg total) by mouth daily with breakfast. 09/25/21   [place], Fonnie Mu, NP  predniSONE (DELTASONE) 5 MG tablet 40 mg x1 day, then 30 mg x1 day, then 20 mg x1  day, then 10 mg x1 day, and then 5 mg x1 day and stop 10/12/21   Brand Males, MD  sodium chloride (OCEAN) 0.65 % SOLN nasal spray Place 2 sprays into both nostrils as needed for congestion. Patient taking differently: Place 2 sprays into both nostrils daily as needed for congestion. 05/05/14   Erick Colace, NP  Tetrahydrozoline HCl (VISINE OP) Apply 1 drop to eye daily.    [provider]    Allergies    Codeine and Prozac [fluoxetine hcl]  Review of Systems   Review of Systems  Unable to perform ROS: Severe respiratory distress  Respiratory:  Positive for shortness of breath.    Physical Exam Updated Vital Signs BP 120/80   Pulse (!) 111   Resp (!) 24   SpO2 93%   Physical Exam CONSTITUTIONAL: Elderly, HEAD: Normocephalic/atraumatic EYES: Pupils appear pinpoint ENMT: Mucous membranes dry, no stridor, no angioedema NECK: supple no meningeal signs SPINE/BACK: No bruising/crepitance/stepoffs noted to spine CV: S1/S2 noted, tachycardic LUNGS: Respiratory distress, decreased breath sounds bilaterally ABDOMEN: soft, nondistended GU: Patient wearing a diaper NEURO: Pt is awake  with his eyes open but is nonresponsive to voice.   EXTREMITIES: pulses normal/equal, no deformities SKIN: Diaphoretic PSYCH: Unable to assess  ED Results / Procedures / Treatments   Labs (all labs ordered are listed, but only abnormal results are displayed) Labs Reviewed  RESP PANEL BY RT-PCR (FLU A&B, COVID) ARPGX2 - Abnormal; Notable for the following components:      Result Value   SARS Coronavirus 2 by RT PCR POSITIVE (*)    All other components within normal limits  COMPREHENSIVE METABOLIC PANEL - Abnormal; Notable for the following components:   Chloride 85 (*)    CO2 39 (*)    Glucose, Bld 195 (*)    Calcium 8.2 (*)    Total Protein 6.4 (*)    Albumin 3.2 (*)    All other components within normal limits  CBC WITH DIFFERENTIAL/PLATELET - Abnormal; Notable for the following components:   WBC 90.7 (*)    MCH 25.8 (*)    MCHC 28.3 (*)    Lymphs Abs 81.9 (*)    Abs Immature Granulocytes 0.44 (*)    All other components within normal limits  URINALYSIS, ROUTINE W REFLEX MICROSCOPIC - Abnormal; Notable for the following components:   Color, Urine AMBER (*)    APPearance HAZY (*)    Protein, ur >=300 (*)    Bacteria, UA RARE (*)    All other components within normal limits  I-STAT VENOUS BLOOD GAS, ED - Abnormal; Notable for the following components:   pCO2, Ven 93.8 (*)    pO2, Ven 161.0 (*)    Bicarbonate 46.5 (*)    TCO2 49 (*)    Acid-Base Excess 15.0 (*)    Calcium, Ion 0.99 (*)    All other components within normal limits  CULTURE, BLOOD (ROUTINE X 2)  CULTURE, BLOOD (ROUTINE X 2)  URINE CULTURE  LACTIC ACID, PLASMA  PROTIME-INR  APTT  LACTIC ACID, PLASMA  BLOOD GAS, VENOUS  PATHOLOGIST SMEAR REVIEW  CBC  BASIC METABOLIC PANEL  MAGNESIUM  PHOSPHORUS    EKG EKG Interpretation  Date/Time:  Saturday November 04 2021 02:17:17 EST Ventricular Rate:  119 PR Interval:  144 QRS Duration: 153 QT Interval:  327 QTC Calculation: 461 R Axis:   176 Text  Interpretation: Sinus tachycardia RBBB and LPFB Confirmed by Ripley Fraise [phone]) on 11/04/2021 2:40:01 AM  Radiology  DG Chest Port 1 View  Result Date: 11/04/2021 CLINICAL DATA:  Concern for sepsis. EXAM: PORTABLE CHEST 1 VIEW COMPARISON:  Chest radiograph dated 08/23/2021. FINDINGS: Endotracheal tube with tip approximately 5 cm above the carina. Enteric tube extends below the diaphragm with tip beyond the inferior margin of the image. Bilateral upper lobe as well as right mid to lower lung field subpleural opacities most consistent with multilobar pneumonia, possibly viral or atypical in etiology including COVID-19. Clinical correlation and follow-up to resolution recommended. No pleural effusion or pneumothorax. Stable cardiac silhouette. No acute osseous pathology. IMPRESSION: 1. Endotracheal tube above the carina. 2. Multilobar pneumonia. Electronically Signed   By: Anner Crete M.D.   On: 11/04/2021 02:31    Procedures .Critical Care Performed by: Ripley Fraise, MD Authorized by: Ripley Fraise, MD   Critical care provider statement:    Critical care time (minutes):  35   Critical care start time:  11/04/2021 2:45 AM   Critical care end time:  11/04/2021 3:20 AM   Critical care time was exclusive of:  Separately billable procedures and treating other patients   Critical care was necessary to treat or prevent imminent or life-threatening deterioration of the following conditions:  Respiratory failure and sepsis   Critical care was time spent personally by me on the following activities:  Discussions with consultants, pulse oximetry, ordering and review of radiographic studies, re-evaluation of patient's condition, review of old charts, ventilator management, examination of patient, evaluation of patient's response to treatment, ordering and review of laboratory studies and ordering and performing treatments and interventions   I assumed direction of critical care for this  patient from another provider in my specialty: no     Care discussed with: admitting provider   Procedure Name: Intubation Date/Time: 11/04/2021 2:45 AM Performed by: Ripley Fraise, MD Pre-anesthesia Checklist: Suction available, Patient identified and Patient being monitored Oxygen Delivery Method: Ambu bag Preoxygenation: Pre-oxygenation with 100% oxygen Ventilation: Mask ventilation with difficulty Laryngoscope Size: Glidescope Grade View: Grade I Tube size: 7.5 mm Number of attempts: 1 Airway Equipment and Method: Video-laryngoscopy Placement Confirmation: ETT inserted through vocal cords under direct vision, CO2 detector and Breath sounds checked- equal and bilateral Secured at: 23 cm Tube secured with: ETT holder      Medications Ordered in ED Medications  etomidate (AMIDATE) injection (20 mg Intravenous Given 11/04/21 0207)  succinylcholine (ANECTINE) injection (100 mg Intravenous Given 11/04/21 0208)  lactated ringers infusion (has no administration in time range)  lactated ringers bolus 1,000 mL (0 mLs Intravenous Stopped 11/04/21 0353)    And  lactated ringers bolus 1,000 mL (1,000 mLs Intravenous New Bag/Given 11/04/21 0354)  cefTRIAXone (ROCEPHIN) 2 g in sodium chloride 0.9 % 100 mL IVPB (0 g Intravenous Stopped 11/04/21 0354)  azithromycin (ZITHROMAX) 500 mg in sodium chloride 0.9 % 250 mL IVPB (0 mg Intravenous Stopped 11/04/21 0408)  0.9 %  sodium chloride infusion (has no administration in time range)  docusate sodium (COLACE) capsule 100 mg (has no administration in time range)  polyethylene glycol (MIRALAX / GLYCOLAX) packet 17 g (has no administration in time range)  pantoprazole (PROTONIX) injection 40 mg (has no administration in time range)  ondansetron (ZOFRAN) injection 4 mg (has no administration in time range)  0.9 %  sodium chloride infusion (has no administration in time range)  norepinephrine (LEVOPHED) 4mg  in 22mL (0.016 mg/mL) premix infusion  (has no administration in time range)  enoxaparin (LOVENOX) injection 40 mg (has no administration in time range)  propofol (DIPRIVAN) 1000 MG/100ML infusion (0 mcg/kg/min  Stopped 11/04/21 0247)    ED Course  I have reviewed the triage vital signs and the nursing notes.  Pertinent labs & imaging results that were available during my care of the patient were reviewed by me and considered in my medical decision making (see chart for details).    MDM Rules/Calculators/A&P                           This patient presents to the ED for concern of respiratory distress, this involves an extensive number of treatment options, and is a complaint that carries with it a high risk of complications and morbidity.  The differential diagnosis includes COVID-19, pneumonia, pneumothorax, acute coronary syndrome, congestive heart failure   Lab Tests:  I Ordered, reviewed, and interpreted labs, which included electrolytes, lactic acid, COVID testing, complete blood count  Medicines ordered:  I ordered medication IV fluids and antibiotics for pneumonia  Imaging Studies ordered:  I ordered imaging studies which included chest x-ray  I independently visualized and interpreted imaging which showed pneumonia  Additional history obtained:  Additional history obtained from paramedics Previous records obtained and reviewed   Consultations Obtained:  I consulted critical care medicine and discussed lab and imaging findings for admission  Reevaluation:  After the interventions stated above, I reevaluated the patient and found patient is in critical condition  Critical Interventions:  Patient seen arrival respiratory distress/arrest.  Patient had his eyes open but was otherwise altered and was in clear distress.  Patient was intubated without difficulty.  Patient apparently is on hospice, has a DNR in place but per his records he is a full intubation. It  also reported the patient has  COVID-19. Code sepsis will be initiated. Patient will need to be admitted to the ICU 3:29 AM Discussed with his wife via phone.  She reports pt. has had increasing cough recently and was placed on antibiotics for presumed pneumonia.  He took a home COVID test that was positive.  She reports his respiratory status worsened dramatically tonight and ultimately called 911. Discussed the case with Dr. Earlie Server with critical care who will admit the patient Final Clinical Impression(s) / ED Diagnoses Final diagnoses:  Acute respiratory failure with hypercapnia ([place])  COVID-19    Rx / DC Orders ED Discharge Orders     None        Ripley Fraise, MD 11/04/21 0422

## 2021-11-04 NOTE — ED Notes (Signed)
BP 67/46. Propofol stopped. MD made aware

## 2021-11-04 NOTE — Progress Notes (Signed)
Pt being followed by [facility] for Sepsis protocol. 

## 2021-11-04 NOTE — Progress Notes (Signed)
Transported pt to 3M13 from ED on vent. No issues

## 2021-11-04 NOTE — H&P (Signed)
NAME:  Isaac [name], MRN:  [id number], DOB:  09/21/1956, LOS: 0 ADMISSION DATE:  11/04/2021, CONSULTATION DATE: 11/04/2021 REFERRING MD: Emergency room, CHIEF COMPLAINT: Acute respiratory failure  History of Present Illness:  This is a 64 year old male with a known history of CLL and COPD.  Patient presented to the emergency room in severe respiratory distress.  He was emergently intubated on arrival.  He had not been feeling well according the patient's wife yesterday with generalized fatigue, fevers, increasing shortness of breath/dyspnea on exertion.  Therefore the patient wife performed a at home COVID test which was positive.  His condition continue to worsen over the next 12 hours when the patient's wife finally called 911 and patient was brought to the hospital.  Pertinent  Medical History  COPD CLL  Significant Hospital Events: Including procedures, antibiotic start and stop dates in addition to other pertinent events     Objective   Blood pressure (!) 61/43, pulse (!) 59, temperature 99.8 F (37.7 C), resp. rate (!) 27, SpO2 96 %.    Vent Mode: PRVC FiO2 (%):  [40 %] 40 % Set Rate:  [14 bmp] 14 bmp Vt Set:  [500 mL] 500 mL PEEP:  [5 cmH20] 5 cmH20 Plateau Pressure:  [16 cmH20] 16 cmH20   Intake/Output Summary (Last 24 hours) at 11/04/2021 0316 Last data filed at 11/04/2021 0247 Gross per 24 hour  Intake 10 ml  Output --  Net 10 ml   There were no vitals filed for this visit.  Examination: General: No acute distress HENT: Anicteric mucous membranes are moist patient's orally intubated with endotracheal tube. Lungs: Very diminished breath sounds but otherwise clear.  No significant rales/wheezing/rhonchi. Cardiovascular: Regular rate no murmur rub or gallop appreciated Abdomen: Soft, nontender, nondistended, positive bowel sounds. Extremities: Distal pulses intact x4.  No significant cyanosis. Neuro: Awake and alert.  Nods to simple questions.  Not consistently  following commands with upper or lower extremities.  Does have spontaneous movement of all of all 4 extremities.  Pupils are equal. GU: Foley catheter intact.    Assessment & Plan:  Acute respiratory failure COVID pneumonia COPD CLL  Plan: Patient was admitted to the intensive care unit for further work-up. Patient was emergently intubated in the emergency room.  Standard ventilator protocols been initiated. Standard COVID therapies was started with Decadron and remdesivir. Continue Zithromax and Rocephin for antibiotic coverage. Patient is currently awake and alert.  We will use dexmedetomidine protocol. Fentanyl for pain. Monitor I's/O's.  Avoid nephrotoxic medications. N.p.o.   Best Practice (right click and "Reselect all SmartList Selections" daily)   Diet/type: NPO DVT prophylaxis:  GI prophylaxis: PPI Lines: N/A Foley:  Yes, and it is still needed Code Status:  DNR Last date of multidisciplinary goals of care discussion []   Labs   CBC: Recent Labs  Lab 11/04/21 0202 11/04/21 0220  WBC 90.7*  --   NEUTROABS 7.4  --   HGB 13.0 15.3  HCT 45.9 45.0  MCV 91.3  --   PLT 242  --     Basic Metabolic Panel: Recent Labs  Lab 11/04/21 0202 11/04/21 0220  NA 136 136  K 4.4 4.1  CL 85*  --   CO2 39*  --   GLUCOSE 195*  --   BUN 14  --   CREATININE 1.01  --   CALCIUM 8.2*  --    GFR: Estimated Creatinine Clearance: 82.8 mL/min (by C-G formula based on SCr of 1.01 mg/dL). Recent Labs  Lab 11/04/21 0202 11/04/21 0206  WBC 90.7*  --   LATICACIDVEN  --  1.0    Liver Function Tests: Recent Labs  Lab 11/04/21 0202  AST 27  ALT 14  ALKPHOS 83  BILITOT 0.8  PROT 6.4*  ALBUMIN 3.2*   No results for input(s): LIPASE, AMYLASE in the last 168 hours. No results for input(s): AMMONIA in the last 168 hours.  ABG    Component Value Date/Time   PHART 7.328 (L) 08/21/2021 0508   PCO2ART 69.1 (HH) 08/21/2021 0508   PO2ART 88 08/21/2021 0508   HCO3 46.5  (H) 11/04/2021 0220   TCO2 49 (H) 11/04/2021 0220   ACIDBASEDEF 28.0 (H) 03/06/2021 0810   O2SAT 99.0 11/04/2021 0220     Coagulation Profile: Recent Labs  Lab 11/04/21 0202  INR 1.1    Cardiac Enzymes: No results for input(s): CKTOTAL, CKMB, CKMBINDEX, TROPONINI in the last 168 hours.  HbA1C: Hgb A1c MFr Bld  Date/Time Value Ref Range Status  08/21/2021 08:13 AM 5.6 4.8 - 5.6 % Final    Comment:    (NOTE) Pre diabetes:          5.7%-6.4%  Diabetes:              >6.4%  Glycemic control for   <7.0% adults with diabetes   03/08/2021 04:40 AM 6.0 (H) 4.8 - 5.6 % Final    Comment:    (NOTE) Pre diabetes:          5.7%-6.4%  Diabetes:              >6.4%  Glycemic control for   <7.0% adults with diabetes     CBG: No results for input(s): GLUCAP in the last 168 hours.  Review of Systems:   Unable to obtain due to patient's requirement for intubation.  Past Medical History:  He,  has a past medical history of Acute on chronic respiratory failure with hypoxia ([place]), Anxiety, Chronic lymphocytic leukemia in remission ([place]), COPD (chronic obstructive pulmonary disease) ([place]), COPD, severe ([place]), GERD (gastroesophageal reflux disease), Hematuria, Hyperlipidemia, Hypertension, Insomnia, Left lower lobe pneumonia, Nephrolithiasis, and Tobacco dependence.   Surgical History:   Past Surgical History:  Procedure Laterality Date   LITHOTRIPSY       Social History:   reports that he quit smoking about 12 years ago. His smoking use included cigarettes. He has a 35.00 pack-year smoking history. He has never used smokeless tobacco.   Family History:  His family history includes Heart disease in his father; Leukemia in his brother; Stroke in his mother.   Allergies Allergies  Allergen Reactions   Codeine Itching and Other (See Comments)    "jittery"   Prozac [Fluoxetine Hcl] Other (See Comments)    confusion     Home Medications  Prior to Admission medications    Medication Sig Start Date End Date Taking? Authorizing Provider  albuterol (PROVENTIL) (2.5 MG/3ML) 0.083% nebulizer solution Take 3 mLs (2.5 mg total) by nebulization every 4 (four) hours as needed for wheezing or shortness of breath. Patient taking differently: Take 2.5 mg by nebulization every 4 (four) hours. 12/19/20   Donita Brooks, NP  albuterol (VENTOLIN HFA) 108 (90 Base) MCG/ACT inhaler INHALE 2 PUFFS INTO THE LUNGS EVERY 4 (FOUR) HOURS AS NEEDED FOR WHEEZING OR SHORTNESS OF BREATH. Patient taking differently: Inhale 2 puffs into the lungs every 4 (four) hours. 05/31/21   Brand Males, MD  allopurinol (ZYLOPRIM) 100 MG tablet Place 1 tablet (100  mg total) into feeding tube daily. 04/07/21   Wyatt Portela, MD  benzonatate (TESSALON) 200 MG capsule Take 200 mg by mouth 3 (three) times daily as needed. 08/03/21   [provider]  budesonide (PULMICORT) 0.5 MG/2ML nebulizer solution TAKE 2 ML BY NEBULIZATION 2 TIMES DAILY. 07/25/21   Brand Males, MD  cloNIDine (CATAPRES - DOSED IN MG/24 HR) 0.1 mg/24hr patch Place 0.1 mg onto the skin once a week. Tuesdays 03/02/21   [provider]  docusate sodium (COLACE) 100 MG capsule Take 100 mg by mouth daily.    [provider]  doxycycline (VIBRA-TABS) 100 MG tablet Take 1 tablet (100 mg total) by mouth 2 (two) times daily. 10/12/21   Brand Males, MD  Ensure (ENSURE) Take 237 mLs by mouth daily. Chocolate flavor    [provider]  furosemide (LASIX) 40 MG tablet Take 40 mg by mouth daily. 03/02/21   [provider]  gabapentin (NEURONTIN) 100 MG capsule Take 100 mg by mouth daily. 09/25/21   [provider]  guaiFENesin (MUCINEX) 600 MG 12 hr tablet Take 600 mg by mouth daily. Patient not taking: Reported on 09/25/2021 06/23/12   [provider]  hydrOXYzine (VISTARIL) 25 MG capsule Take 50 mg by mouth at bedtime. 07/18/21   [provider]  ibrutinib (IMBRUVICA) 420 MG  TABS Take 1 tablet (420 mg) by mouth daily. 07/25/21   Wyatt Portela, MD  ipratropium-albuterol (DUONEB) 0.5-2.5 (3) MG/3ML SOLN TAKE 3 MLS (ONE VIAL) BY NEBULIZATION EVERY 6 (SIX) HOURS AS NEEDED. Patient taking differently: Inhale 3 mLs into the lungs every 4 (four) hours. 06/14/21   Brand Males, MD  loratadine (CLARITIN) 10 MG tablet Take 10 mg by mouth daily.    [provider]  LORazepam (ATIVAN) 0.5 MG tablet Take 0.5 mg by mouth 2 (two) times daily as needed. 08/29/21   [provider]  metoprolol tartrate (LOPRESSOR) 25 MG tablet Take 25 mg by mouth 2 (two) times daily. 01/26/21   [provider]  montelukast (SINGULAIR) 10 MG tablet Take 10 mg by mouth every evening.    [provider]  morphine (MSIR) 15 MG tablet Take 7.5 mg by mouth every 6 (six) hours as needed. Patient not taking: Reported on 09/25/2021 09/06/21   [provider]  pantoprazole (PROTONIX) 40 MG tablet Take 40 mg by mouth at bedtime. 03/02/21   [provider]  polyethylene glycol (MIRALAX / GLYCOLAX) 17 g packet Place 17 g into feeding tube daily as needed for moderate constipation. Patient not taking: Reported on 09/25/2021 12/19/20   Donita Brooks, NP  predniSONE (DELTASONE) 20 MG tablet Take 1 tablet (20 mg total) by mouth daily with breakfast. 09/25/21   Parrett, Fonnie Mu, NP  predniSONE (DELTASONE) 5 MG tablet 40 mg x1 day, then 30 mg x1 day, then 20 mg x1 day, then 10 mg x1 day, and then 5 mg x1 day and stop 10/12/21   Brand Males, MD  sodium chloride (OCEAN) 0.65 % SOLN nasal spray Place 2 sprays into both nostrils as needed for congestion. Patient taking differently: Place 2 sprays into both nostrils daily as needed for congestion. 05/05/14   Erick Colace, NP  Tetrahydrozoline HCl (VISINE OP) Apply 1 drop to eye daily.    [provider]     Critical care time: 45 minutes

## 2021-11-04 NOTE — Progress Notes (Signed)
Critical lab of WBC OF 51.3 called on the patient, Isaac [name] called to notify

## 2021-11-04 NOTE — Progress Notes (Signed)
An USGPIV (ultrasound guided PIV) has been placed for short-term vasopressor infusion. Correctly placed ivWatch must be used when administering Vasopressors. Should this treatment be needed beyond 72 hours, central line access should be obtained.  It will be the responsibility of the bedside nurse to follow best practice to prevent extravasations.   

## 2021-11-04 NOTE — Procedures (Signed)
Intubation Procedure Note  CHARVIS LIGHTNER  [id number]  1956/07/30  Date:11/04/21  Time:2:23 AM   Provider Performing:Jess Toney, Carver Fila    Procedure: Intubation (31500)  Indication(s) Respiratory Failure  Consent Risks of the procedure as well as the alternatives and risks of each were explained to the patient and/or caregiver.  Consent for the procedure was obtained and is signed in the bedside chart   Anesthesia Versed   Time Out Verified patient identification, verified procedure, site/side was marked, verified correct patient position, special equipment/implants available, medications/allergies/relevant history reviewed, required imaging and test results available.   Sterile Technique Usual hand hygeine, masks, and gloves were used   Procedure Description Patient positioned in bed supine.  Sedation given as noted above.  Patient was intubated with endotracheal tube using Glidescope.  View was Grade 1 full glottis .  Number of attempts was 1.  Colorimetric CO2 detector was consistent with tracheal placement.   Complications/Tolerance None; patient tolerated the procedure well. Chest X-ray is ordered to verify placement.   EBL Minimal   Specimen(s) None

## 2021-11-04 NOTE — ED Triage Notes (Signed)
Pt bib [facility] from home in respiratory distress. Pt covid positive today, began having increased WOB and confusion 40 minutes PTA. Pt tachypnic and agonal breathing upon EMS arrival, EMS bagged pt. Hx CHF

## 2021-11-05 DIAGNOSIS — J9601 Acute respiratory failure with hypoxia: Secondary | ICD-10-CM | POA: Diagnosis not present

## 2021-11-05 LAB — BASIC METABOLIC PANEL
Anion gap: 13 (ref 5–15)
BUN: 26 mg/dL — ABNORMAL HIGH (ref 8–23)
CO2: 31 mmol/L (ref 22–32)
Calcium: 8.1 mg/dL — ABNORMAL LOW (ref 8.9–10.3)
Chloride: 91 mmol/L — ABNORMAL LOW (ref 98–111)
Creatinine, Ser: 1.1 mg/dL (ref 0.61–1.24)
GFR, Estimated: >60 mL/min (ref >60)
Glucose, Bld: 120 mg/dL — ABNORMAL HIGH (ref 70–99)
Potassium: 3.8 mmol/L (ref 3.5–5.1)
Sodium: 135 mmol/L (ref 135–145)

## 2021-11-05 LAB — CBC
HCT: 34.1 % — ABNORMAL LOW (ref 39.0–52.0)
Hemoglobin: 10.3 g/dL — ABNORMAL LOW (ref 13.0–17.0)
MCH: 26.3 pg (ref 26.0–34.0)
MCHC: 30.2 g/dL (ref 30.0–36.0)
MCV: 87 fL (ref 80.0–100.0)
Platelets: 147 10*3/uL — ABNORMAL LOW (ref 150–400)
RBC: 3.92 MIL/uL — ABNORMAL LOW (ref 4.22–5.81)
RDW: 14.5 % (ref 11.5–15.5)
WBC: 36.4 10*3/uL — ABNORMAL HIGH (ref 4.0–10.5)
nRBC: 0 % (ref 0.0–0.2)

## 2021-11-05 LAB — GLUCOSE, CAPILLARY
Glucose-Capillary: 111 mg/dL — ABNORMAL HIGH (ref 70–99)
Glucose-Capillary: 123 mg/dL — ABNORMAL HIGH (ref 70–99)
Glucose-Capillary: 124 mg/dL — ABNORMAL HIGH (ref 70–99)
Glucose-Capillary: 123 mg/dL — ABNORMAL HIGH (ref 70–99)
Glucose-Capillary: 129 mg/dL — ABNORMAL HIGH (ref 70–99)
Glucose-Capillary: 93 mg/dL (ref 70–99)

## 2021-11-05 LAB — MAGNESIUM: Magnesium: 2 mg/dL (ref 1.7–2.4)

## 2021-11-05 LAB — URINE CULTURE: Culture: NO GROWTH

## 2021-11-05 LAB — PHOSPHORUS: Phosphorus: 3 mg/dL (ref 2.5–4.6)

## 2021-11-05 MED ORDER — HYDROXYZINE HCL 50 MG/ML IM SOLN
25.0000 mg | Freq: Four times a day (QID) | INTRAMUSCULAR | Status: DC | PRN
  Administered 2021-11-05: 25 mg via INTRAMUSCULAR
  Filled 2021-11-05 (×3): qty 0.5

## 2021-11-05 MED ORDER — DIPHENHYDRAMINE HCL 25 MG PO CAPS: 25.0000 mg | ORAL_CAPSULE | Freq: Four times a day (QID) | ORAL | Status: DC | PRN

## 2021-11-05 MED ORDER — IPRATROPIUM-ALBUTEROL 0.5-2.5 (3) MG/3ML IN SOLN
3.0000 mL | Freq: Four times a day (QID) | RESPIRATORY_TRACT | Status: DC | PRN
  Administered 2021-11-06: 3 mL via RESPIRATORY_TRACT
  Filled 2021-11-05: qty 3

## 2021-11-05 MED ORDER — DIPHENHYDRAMINE HCL 12.5 MG/5ML PO ELIX
25.0000 mg | ORAL_SOLUTION | Freq: Four times a day (QID) | ORAL | Status: DC | PRN
  Administered 2021-11-05 (×2): 25 mg
  Filled 2021-11-05 (×2): qty 10

## 2021-11-05 MED ORDER — HYDROCERIN EX CREA
TOPICAL_CREAM | Freq: Two times a day (BID) | CUTANEOUS | Status: DC
  Administered 2021-11-06 – 2021-11-08 (×2): 1 via TOPICAL
  Filled 2021-11-05 (×3): qty 113

## 2021-11-05 NOTE — Progress Notes (Signed)
NAME:  Isaac [name], MRN:  [id number], DOB:  [birth year], LOS: 1 ADMISSION DATE:  11/04/2021, CONSULTATION DATE: 11/04/2021 REFERRING MD: Emergency room, CHIEF COMPLAINT: Acute respiratory failure  History of Present Illness:  This is a 65 year old male with a known history of CLL and COPD.  Patient presented to the emergency room in severe respiratory distress.  He was emergently intubated on arrival.  He had not been feeling well according the patient's wife yesterday with generalized fatigue, fevers, increasing shortness of breath/dyspnea on exertion.  Therefore the patient wife performed a at home COVID test which was positive.  His condition continue to worsen over the next 12 hours when the patient's wife finally called 911 and patient was brought to the hospital.  Pertinent  Medical History  COPD CLL  Significant Hospital Events: Including procedures, antibiotic start and stop dates in addition to other pertinent events      Interval events, subjective: 0.40, PEEP 5 Fentanyl drip added 12/10 I/o + 4.3L total   Objective   Blood pressure 113/62, pulse 89, temperature 98.6 F (37 C), resp. rate 12, height 5\' 10"  (1.778 m), SpO2 91 %.    Vent Mode: PRVC FiO2 (%):  [40 %] 40 % Set Rate:  [14 bmp-22 bmp] 14 bmp Vt Set:  [500 mL-580 mL] 580 mL PEEP:  [5 cmH20] 5 cmH20 Plateau Pressure:  [18 cmH20-20 cmH20] 18 cmH20   Intake/Output Summary (Last 24 hours) at 11/05/2021 0754 Last data filed at 11/05/2021 0600 Gross per 24 hour  Intake 2603.74 ml  Output 890 ml  Net 1713.74 ml   There were no vitals filed for this visit.  Examination: General: Chronically ill-appearing man in bed, ventilated HENT: ET tube in place, poor dentition, oropharynx otherwise clear, pupils equal Lungs: Coarse bilaterally, no wheezing or crackles Cardiovascular: Regular, distant, no murmur Abdomen: Nondistended, positive bowel sounds Extremities: No edema Neuro: Awake and alert, follows  commands, writing to communicate.  Moves all extremities GU: Foley catheter intact.    Assessment & Plan:   Acute respiratory failure with hypoxemia due to COVID-19 PNA Underlying COPD without active wheezing Possible superimposed bacterial PNA -PRVC 8 cc/kg -VAP prevention orders -Sedation per PAD protocol, attempt to lighten -May be ready to start working on PSV/SBT 12/12 -Empiric azithromycin and ceftriaxone as ordered -Remdesivir, dexamethasone as ordered  Acute encephalopathy Need for sedation -PAD protocol, Precedex, fentanyl  CLL -On Imbruvica, currently on hold in setting of active infxn  Pruritus, generalized without rash -Did not respond to Benadryl, try hydroxyzine  Acute renal insufficiency, improving -Follow urine output, BMP with resuscitation   Best Practice (right click and "Reselect all SmartList Selections" daily)   Diet/type: NPO DVT prophylaxis:  GI prophylaxis: PPI Lines: N/A Foley:  Yes, and it is still needed Code Status:  DNR Last date of multidisciplinary goals of care discussion []   Labs   CBC: Recent Labs  Lab 11/04/21 0202 11/04/21 0220 11/04/21 0500 11/04/21 0546 11/04/21 0728 11/05/21 0226  WBC 90.7*  --  51.5*  --  70.6* 36.4*  NEUTROABS 7.4  --   --   --   --   --   HGB 13.0 15.3 10.8* 12.2* 11.1* 10.3*  HCT 45.9 45.0 36.9* 36.0* 37.9* 34.1*  MCV 91.3  --  90.2  --  88.6 87.0  PLT 242  --  192  --  222 147*    Basic Metabolic Panel: Recent Labs  Lab 11/04/21 0202 11/04/21 0220 11/04/21 0500 11/04/21 0546 11/04/21  9407 11/05/21 0226  NA 136 136 136 135  --  135  K 4.4 4.1 4.1 3.5  --  3.8  CL 85*  --  85*  --   --  91*  CO2 39*  --  40*  --   --  31  GLUCOSE 195*  --  234*  --   --  120*  BUN 14  --  17  --   --  26*  CREATININE 1.01  --  1.27*  --  1.33* 1.10  CALCIUM 8.2*  --  7.7*  --   --  8.1*  MG  --   --  1.9  --   --  2.0  PHOS  --   --  3.2  --   --  3.0   GFR: Estimated Creatinine Clearance: 76.1  mL/min (by C-G formula based on SCr of 1.1 mg/dL). Recent Labs  Lab 11/04/21 0202 11/04/21 0206 11/04/21 0500 11/04/21 0728 11/05/21 0226  WBC 90.7*  --  51.5* 70.6* 36.4*  LATICACIDVEN  --  1.0 1.8  --   --     Liver Function Tests: Recent Labs  Lab 11/04/21 0202  AST 27  ALT 14  ALKPHOS 83  BILITOT 0.8  PROT 6.4*  ALBUMIN 3.2*   No results for input(s): LIPASE, AMYLASE in the last 168 hours. No results for input(s): AMMONIA in the last 168 hours.  ABG    Component Value Date/Time   PHART 7.453 (H) 11/04/2021 0546   PCO2ART 57.4 (H) 11/04/2021 0546   PO2ART 81 (L) 11/04/2021 0546   HCO3 36.3 (H) 11/04/2021 1005   TCO2 42 (H) 11/04/2021 0546   ACIDBASEDEF 28.0 (H) 03/06/2021 0810   O2SAT 75.4 11/04/2021 1005     Coagulation Profile: Recent Labs  Lab 11/04/21 0202  INR 1.1    Cardiac Enzymes: No results for input(s): CKTOTAL, CKMB, CKMBINDEX, TROPONINI in the last 168 hours.  HbA1C: Hgb A1c MFr Bld  Date/Time Value Ref Range Status  08/21/2021 08:13 AM 5.6 4.8 - 5.6 % Final    Comment:    (NOTE) Pre diabetes:          5.7%-6.4%  Diabetes:              >6.4%  Glycemic control for   <7.0% adults with diabetes   03/08/2021 04:40 AM 6.0 (H) 4.8 - 5.6 % Final    Comment:    (NOTE) Pre diabetes:          5.7%-6.4%  Diabetes:              >6.4%  Glycemic control for   <7.0% adults with diabetes     CBG: Recent Labs  Lab 11/04/21 1111 11/04/21 1518 11/04/21 2015 11/04/21 2337 11/05/21 0322  GLUCAP 145* 113* 120* 121* 111*     Critical care time: 33 minutes     Baltazar Apo, MD, PhD 11/05/2021, 7:54 AM [facility] [phone] or if no answer before 7:00PM call [phone] For any issues after 7:00PM please call eLink [phone]

## 2021-11-05 NOTE — Progress Notes (Signed)
[place] Progress Note Patient Name: Isaac [name] DOB: 03-09-1956 MRN: [id number]   Date of Service  11/05/2021  HPI/Events of Note  C/o itching, chronic rash  eICU Interventions  PRN benadryl 25 mg q6h     Intervention Category Minor Interventions: Other:  Shantoria Ellwood Rodman Pickle 11/05/2021, 4:00 AM

## 2021-11-05 NOTE — Progress Notes (Signed)
RT NOTES: Attempted patient on SBT 14/5. Patient lasted 3 minutes before desaturating to 86%. Placed back on full support at this time.

## 2021-11-06 ENCOUNTER — Inpatient Hospital Stay (HOSPITAL_COMMUNITY): Payer: BC Managed Care – PPO

## 2021-11-06 ENCOUNTER — Inpatient Hospital Stay (HOSPITAL_COMMUNITY): Payer: Medicare Other

## 2021-11-06 DIAGNOSIS — J9602 Acute respiratory failure with hypercapnia: Secondary | ICD-10-CM

## 2021-11-06 DIAGNOSIS — R609 Edema, unspecified: Secondary | ICD-10-CM

## 2021-11-06 DIAGNOSIS — U071 COVID-19: Secondary | ICD-10-CM

## 2021-11-06 DIAGNOSIS — J1282 Pneumonia due to coronavirus disease 2019: Secondary | ICD-10-CM

## 2021-11-06 LAB — GLUCOSE, CAPILLARY
Glucose-Capillary: 117 mg/dL — ABNORMAL HIGH (ref 70–99)
Glucose-Capillary: 105 mg/dL — ABNORMAL HIGH (ref 70–99)
Glucose-Capillary: 104 mg/dL — ABNORMAL HIGH (ref 70–99)
Glucose-Capillary: 173 mg/dL — ABNORMAL HIGH (ref 70–99)
Glucose-Capillary: 109 mg/dL — ABNORMAL HIGH (ref 70–99)

## 2021-11-06 LAB — MAGNESIUM: Magnesium: 2.2 mg/dL (ref 1.7–2.4)

## 2021-11-06 LAB — PHOSPHORUS: Phosphorus: 4.2 mg/dL (ref 2.5–4.6)

## 2021-11-06 LAB — BASIC METABOLIC PANEL
Anion gap: 12 (ref 5–15)
BUN: 25 mg/dL — ABNORMAL HIGH (ref 8–23)
CO2: 34 mmol/L — ABNORMAL HIGH (ref 22–32)
Calcium: 8.6 mg/dL — ABNORMAL LOW (ref 8.9–10.3)
Chloride: 95 mmol/L — ABNORMAL LOW (ref 98–111)
Creatinine, Ser: 0.92 mg/dL (ref 0.61–1.24)
GFR, Estimated: >60 mL/min (ref >60)
Glucose, Bld: 116 mg/dL — ABNORMAL HIGH (ref 70–99)
Potassium: 3.9 mmol/L (ref 3.5–5.1)
Sodium: 141 mmol/L (ref 135–145)

## 2021-11-06 LAB — POCT I-STAT 7, (LYTES, BLD GAS, ICA,H+H)
Acid-Base Excess: 11 mmol/L — ABNORMAL HIGH (ref 0.0–2.0)
Bicarbonate: 38.4 mmol/L — ABNORMAL HIGH (ref 20.0–28.0)
Calcium, Ion: 1.16 mmol/L (ref 1.15–1.40)
HCT: 34 % — ABNORMAL LOW (ref 39.0–52.0)
Hemoglobin: 11.6 g/dL — ABNORMAL LOW (ref 13.0–17.0)
O2 Saturation: 99 %
Patient temperature: 37.4
Potassium: 3.8 mmol/L (ref 3.5–5.1)
Sodium: 140 mmol/L (ref 135–145)
TCO2: 40 mmol/L — ABNORMAL HIGH (ref 22–32)
pCO2 arterial: 64.4 mmHg — ABNORMAL HIGH (ref 32.0–48.0)
pH, Arterial: 7.385 (ref 7.350–7.450)
pO2, Arterial: 138 mmHg — ABNORMAL HIGH (ref 83.0–108.0)

## 2021-11-06 LAB — CBC WITH DIFFERENTIAL/PLATELET
Abs Immature Granulocytes: 0 10*3/uL (ref 0.00–0.07)
Basophils Absolute: 0 10*3/uL (ref 0.0–0.1)
Basophils Relative: 0 %
Eosinophils Absolute: 0 10*3/uL (ref 0.0–0.5)
Eosinophils Relative: 0 %
HCT: 35.1 % — ABNORMAL LOW (ref 39.0–52.0)
Hemoglobin: 10.4 g/dL — ABNORMAL LOW (ref 13.0–17.0)
Lymphocytes Relative: 63 %
Lymphs Abs: 26.9 10*3/uL — ABNORMAL HIGH (ref 0.7–4.0)
MCH: 26.1 pg (ref 26.0–34.0)
MCHC: 29.6 g/dL — ABNORMAL LOW (ref 30.0–36.0)
MCV: 88.2 fL (ref 80.0–100.0)
Monocytes Absolute: 0.4 10*3/uL (ref 0.1–1.0)
Monocytes Relative: 1 %
Neutro Abs: 15.4 10*3/uL — ABNORMAL HIGH (ref 1.7–7.7)
Neutrophils Relative %: 36 %
Platelets: 154 10*3/uL (ref 150–400)
RBC: 3.98 MIL/uL — ABNORMAL LOW (ref 4.22–5.81)
RDW: 14.4 % (ref 11.5–15.5)
WBC: 42.7 10*3/uL — ABNORMAL HIGH (ref 4.0–10.5)
nRBC: 0 /100 WBC
nRBC: 0.2 % (ref 0.0–0.2)

## 2021-11-06 LAB — STREP PNEUMONIAE URINARY ANTIGEN: Strep Pneumo Urinary Antigen: NEGATIVE

## 2021-11-06 LAB — CBC
HCT: 36.4 % — ABNORMAL LOW (ref 39.0–52.0)
Hemoglobin: 10.7 g/dL — ABNORMAL LOW (ref 13.0–17.0)
MCH: 25.9 pg — ABNORMAL LOW (ref 26.0–34.0)
MCHC: 29.4 g/dL — ABNORMAL LOW (ref 30.0–36.0)
MCV: 88.1 fL (ref 80.0–100.0)
Platelets: 172 10*3/uL (ref 150–400)
RBC: 4.13 MIL/uL — ABNORMAL LOW (ref 4.22–5.81)
RDW: 14.3 % (ref 11.5–15.5)
WBC: 53 10*3/uL (ref 4.0–10.5)
nRBC: 0 % (ref 0.0–0.2)

## 2021-11-06 MED ORDER — KETAMINE HCL 50 MG/5ML IJ SOSY
PREFILLED_SYRINGE | INTRAMUSCULAR | Status: AC
  Filled 2021-11-06: qty 5

## 2021-11-06 MED ORDER — CLONIDINE HCL 0.1 MG/24HR TD PTWK
0.1000 mg | MEDICATED_PATCH | TRANSDERMAL | Status: DC
  Administered 2021-11-06: 0.1 mg via TRANSDERMAL
  Filled 2021-11-06: qty 1

## 2021-11-06 MED ORDER — FENTANYL BOLUS VIA INFUSION
25.0000 ug | INTRAVENOUS | Status: DC | PRN
  Administered 2021-11-09: 50 ug via INTRAVENOUS
  Administered 2021-11-09: 100 ug via INTRAVENOUS
  Filled 2021-11-06: qty 100

## 2021-11-06 MED ORDER — IOHEXOL 350 MG/ML SOLN
100.0000 mL | Freq: Once | INTRAVENOUS | Status: AC | PRN
  Administered 2021-11-06: 100 mL via INTRAVENOUS

## 2021-11-06 MED ORDER — SUCCINYLCHOLINE CHLORIDE 200 MG/10ML IV SOSY
PREFILLED_SYRINGE | INTRAVENOUS | Status: AC
  Filled 2021-11-06: qty 10

## 2021-11-06 MED ORDER — HYDROCORTISONE 1 % EX CREA
TOPICAL_CREAM | Freq: Two times a day (BID) | CUTANEOUS | Status: DC
  Administered 2021-11-06 – 2021-11-08 (×2): 1 via TOPICAL
  Filled 2021-11-06 (×2): qty 28

## 2021-11-06 MED ORDER — ETOMIDATE 2 MG/ML IV SOLN
INTRAVENOUS | Status: AC
  Administered 2021-11-06: 20 mg via INTRAVENOUS
  Filled 2021-11-06: qty 20

## 2021-11-06 MED ORDER — POLYETHYLENE GLYCOL 3350 17 G PO PACK
17.0000 g | PACK | Freq: Every day | ORAL | Status: DC
  Administered 2021-11-07 – 2021-11-11 (×3): 17 g
  Filled 2021-11-06 (×4): qty 1

## 2021-11-06 MED ORDER — FENTANYL CITRATE (PF) 100 MCG/2ML IJ SOLN
INTRAMUSCULAR | Status: AC
  Administered 2021-11-06: 100 ug via INTRAVENOUS
  Filled 2021-11-06: qty 2

## 2021-11-06 MED ORDER — BUDESONIDE 0.5 MG/2ML IN SUSP
0.5000 mg | Freq: Two times a day (BID) | RESPIRATORY_TRACT | Status: DC
  Administered 2021-11-06 – 2021-11-15 (×19): 0.5 mg via RESPIRATORY_TRACT
  Filled 2021-11-06 (×19): qty 2

## 2021-11-06 MED ORDER — ETOMIDATE 2 MG/ML IV SOLN: 20.0000 mg | Freq: Once | INTRAVENOUS | Status: AC

## 2021-11-06 MED ORDER — HYDROXYZINE HCL 50 MG/ML IM SOLN
25.0000 mg | Freq: Four times a day (QID) | INTRAMUSCULAR | Status: DC | PRN
  Administered 2021-11-06: 25 mg via INTRAMUSCULAR
  Filled 2021-11-06 (×2): qty 0.5

## 2021-11-06 MED ORDER — IPRATROPIUM-ALBUTEROL 0.5-2.5 (3) MG/3ML IN SOLN
3.0000 mL | Freq: Four times a day (QID) | RESPIRATORY_TRACT | Status: DC
  Administered 2021-11-06 – 2021-11-07 (×3): 3 mL via RESPIRATORY_TRACT
  Filled 2021-11-06 (×3): qty 3

## 2021-11-06 MED ORDER — MIDAZOLAM HCL 2 MG/2ML IJ SOLN
1.0000 mg | INTRAMUSCULAR | Status: DC | PRN
  Administered 2021-11-06 – 2021-11-10 (×20): 1 mg via INTRAVENOUS
  Filled 2021-11-06 (×20): qty 2

## 2021-11-06 MED ORDER — ROCURONIUM BROMIDE 50 MG/5ML IV SOLN: 70.0000 mg | Freq: Once | INTRAVENOUS | Status: AC

## 2021-11-06 MED ORDER — PROSOURCE TF PO LIQD
45.0000 mL | Freq: Two times a day (BID) | ORAL | Status: DC
  Administered 2021-11-06 – 2021-11-07 (×2): 45 mL
  Filled 2021-11-06 (×2): qty 45

## 2021-11-06 MED ORDER — DOCUSATE SODIUM 50 MG/5ML PO LIQD
100.0000 mg | Freq: Two times a day (BID) | ORAL | Status: DC
  Administered 2021-11-06 – 2021-11-11 (×7): 100 mg
  Filled 2021-11-06 (×8): qty 10

## 2021-11-06 MED ORDER — ALBUTEROL SULFATE HFA 108 (90 BASE) MCG/ACT IN AERS
2.0000 | INHALATION_SPRAY | Freq: Four times a day (QID) | RESPIRATORY_TRACT | Status: DC
  Filled 2021-11-06: qty 6.7

## 2021-11-06 MED ORDER — FENTANYL 2500MCG IN NS 250ML (10MCG/ML) PREMIX INFUSION
25.0000 ug/h | INTRAVENOUS | Status: DC
  Administered 2021-11-06: 100 ug/h via INTRAVENOUS
  Administered 2021-11-07 (×2): 150 ug/h via INTRAVENOUS
  Administered 2021-11-08 – 2021-11-10 (×3): 200 ug/h via INTRAVENOUS
  Filled 2021-11-06 (×6): qty 250

## 2021-11-06 MED ORDER — METOPROLOL TARTRATE 12.5 MG HALF TABLET
12.5000 mg | ORAL_TABLET | Freq: Four times a day (QID) | ORAL | Status: DC
Start: 2021-11-06 — End: 2021-11-07
  Administered 2021-11-06 – 2021-11-07 (×5): 12.5 mg
  Filled 2021-11-06 (×5): qty 1

## 2021-11-06 MED ORDER — DEXAMETHASONE SODIUM PHOSPHATE 10 MG/ML IJ SOLN
6.0000 mg | INTRAMUSCULAR | Status: AC
  Administered 2021-11-06 – 2021-11-13 (×8): 6 mg via INTRAVENOUS
  Filled 2021-11-06 (×8): qty 1

## 2021-11-06 MED ORDER — FENTANYL CITRATE (PF) 100 MCG/2ML IJ SOLN: 100.0000 ug | Freq: Once | INTRAMUSCULAR | Status: AC

## 2021-11-06 MED ORDER — VITAL HIGH PROTEIN PO LIQD
1000.0000 mL | ORAL | Status: DC
  Administered 2021-11-06: 1000 mL

## 2021-11-06 MED ORDER — IPRATROPIUM-ALBUTEROL 0.5-2.5 (3) MG/3ML IN SOLN: 3.0000 mL | Freq: Four times a day (QID) | RESPIRATORY_TRACT | Status: DC

## 2021-11-06 MED ORDER — GABAPENTIN 100 MG PO CAPS
100.0000 mg | ORAL_CAPSULE | Freq: Every day | ORAL | Status: DC
  Administered 2021-11-06 – 2021-11-08 (×3): 100 mg via ORAL
  Filled 2021-11-06 (×3): qty 1

## 2021-11-06 MED ORDER — FENTANYL CITRATE (PF) 100 MCG/2ML IJ SOLN
25.0000 ug | Freq: Once | INTRAMUSCULAR | Status: AC
  Administered 2021-11-06: 25 ug via INTRAVENOUS

## 2021-11-06 MED ORDER — LORAZEPAM 2 MG/ML IJ SOLN
0.5000 mg | Freq: Four times a day (QID) | INTRAMUSCULAR | Status: DC | PRN
  Administered 2021-11-06: 0.5 mg via INTRAVENOUS
  Filled 2021-11-06: qty 1

## 2021-11-06 MED ORDER — MIDAZOLAM HCL 2 MG/2ML IJ SOLN: 2.0000 mg | Freq: Once | INTRAMUSCULAR | Status: AC

## 2021-11-06 MED ORDER — METOPROLOL TARTRATE 5 MG/5ML IV SOLN
INTRAVENOUS | Status: AC
  Administered 2021-11-06: 5 mg
  Filled 2021-11-06: qty 5

## 2021-11-06 MED ORDER — ROCURONIUM BROMIDE 10 MG/ML (PF) SYRINGE
PREFILLED_SYRINGE | INTRAVENOUS | Status: AC
  Administered 2021-11-06: 70 mg via INTRAVENOUS
  Filled 2021-11-06: qty 10

## 2021-11-06 MED ORDER — LORAZEPAM 2 MG/ML IJ SOLN
1.0000 mg | Freq: Once | INTRAMUSCULAR | Status: AC
  Administered 2021-11-06: 1 mg via INTRAVENOUS
  Filled 2021-11-06: qty 1

## 2021-11-06 MED ORDER — MIDAZOLAM HCL 2 MG/2ML IJ SOLN
INTRAMUSCULAR | Status: AC
  Administered 2021-11-06: 2 mg via INTRAVENOUS
  Filled 2021-11-06: qty 2

## 2021-11-06 MED ORDER — HYDRALAZINE HCL 20 MG/ML IJ SOLN
10.0000 mg | Freq: Four times a day (QID) | INTRAMUSCULAR | Status: DC | PRN
  Administered 2021-11-06 – 2021-11-10 (×4): 10 mg via INTRAVENOUS
  Filled 2021-11-06 (×5): qty 1

## 2021-11-06 NOTE — Progress Notes (Addendum)
NAME:  Isaac [name], MRN:  [id number], DOB:  March 08, 1956, LOS: 2 ADMISSION DATE:  11/04/2021, CONSULTATION DATE: 11/04/2021 REFERRING MD: Emergency room, CHIEF COMPLAINT: Acute respiratory failure  History of Present Illness:  This is a 65 year old male with a known history of CLL and COPD.  Patient presented to the emergency room in severe respiratory distress.  He was emergently intubated on arrival.  He had not been feeling well according the patient's wife yesterday with generalized fatigue, fevers, increasing shortness of breath/dyspnea on exertion.  Therefore the patient wife performed a at home COVID test which was positive.  His condition continue to worsen over the next 12 hours when the patient's wife finally called 911 and patient was brought to the hospital.  Pertinent  Medical History  COPD CLL  Significant Hospital Events: Including procedures, antibiotic start and stop dates in addition to other pertinent events   12/10 > Presents to ED requiring intubation   Interval events, subjective: This AM on CPAP 10/5, no events overnight. Remains on fentanyl gtt  Objective   Blood pressure (!) 136/114, pulse (!) 108, temperature 98.2 F (36.8 C), resp. rate 16, height 5\' 10"  (1.778 m), SpO2 92 %.    Vent Mode: CPAP;PSV FiO2 (%):  [40 %-50 %] 40 % Set Rate:  [14 bmp] 14 bmp Vt Set:  [580 mL] 580 mL PEEP:  [5 cmH20] 5 cmH20 Pressure Support:  [10 cmH20] 10 cmH20 Plateau Pressure:  [18 cmH20] 18 cmH20   Intake/Output Summary (Last 24 hours) at 11/06/2021 0855 Last data filed at 11/06/2021 0500 Gross per 24 hour  Intake 1178.41 ml  Output 410 ml  Net 768.41 ml   There were no vitals filed for this visit.  Examination: General: Elderly male on vent  HENT: ET tube in place, poor dentition Lungs: Coarse breath sounds, no wheeze/crackles, no use of accessory muscles  Cardiovascular: Regular, distant, no murmur Abdomen: Nondistended, active bowel sounds  Extremities:  -edema  Neuro: alert, oriented >> writing to communicate, follows commands, pupils intact and reactive  GU: Foley catheter intact.    Assessment & Plan:   Acute respiratory failure with hypoxemia due to COVID-19 PNA Underlying COPD without active wheezing Possible superimposed bacterial PNA Plan -PRVC 8 cc/kg -VAP prevention orders -Sedation per PAD protocol (Currently on 175 mcg/hr fentanyl) -Daily SBT >> This AM on CPAP (10/5)  -Empiric azithromycin and ceftriaxone as ordered -Remdesivir, dexamethasone as ordered -Send Resp Quant, Strep P, and Urine Legion, R/O superimposed infections  -Obtain U/S LE and ECHO   Acute encephalopathy Need for sedation Plan -PAD protocol, Precedex, fentanyl  HTN Plan -Home Metoprolol and Clonidine >> restart low dose BB at 12.5 mg q6h   CLL Plan -On Imbruvica, currently on hold in setting of active infxn  Pruritus, generalized without rash Plan -Did not respond to Benadryl, PRN hydroxyzine  Acute renal insufficiency, improving Plan -Follow urine output -Trend BMP -Replace electrolytes as indicated   GERD Plan -PPI   Best Practice (right click and "Reselect all SmartList Selections" daily)   Diet/type: NPO >> if unable to extubate will start TF today  DVT prophylaxis: Lovenox  GI prophylaxis: PPI Lines: N/A Foley:  Yes, and it is still needed Code Status:  DNR Last date of multidisciplinary goals of care discussion Prior to Extubation discuss if patient were to wish re-intubation if fails   Labs   CBC: Recent Labs  Lab 11/04/21 0202 11/04/21 0220 11/04/21 0500 11/04/21 0546 11/04/21 0728 11/05/21 0226 11/06/21 0141  11/06/21 0454  WBC 90.7*  --  51.5*  --  70.6* 36.4* 53.0* 42.7*  NEUTROABS 7.4  --   --   --   --   --   --  15.4*  HGB 13.0   < > 10.8* 12.2* 11.1* 10.3* 10.7* 10.4*  HCT 45.9   < > 36.9* 36.0* 37.9* 34.1* 36.4* 35.1*  MCV 91.3  --  90.2  --  88.6 87.0 88.1 88.2  PLT 242  --  192  --  222 147* 172  154   < > = values in this interval not displayed.    Basic Metabolic Panel: Recent Labs  Lab 11/04/21 0202 11/04/21 0220 11/04/21 0500 11/04/21 0546 11/04/21 0728 11/05/21 0226 11/06/21 0141  NA 136 136 136 135  --  135 141  K 4.4 4.1 4.1 3.5  --  3.8 3.9  CL 85*  --  85*  --   --  91* 95*  CO2 39*  --  40*  --   --  31 34*  GLUCOSE 195*  --  234*  --   --  120* 116*  BUN 14  --  17  --   --  26* 25*  CREATININE 1.01  --  1.27*  --  1.33* 1.10 0.92  CALCIUM 8.2*  --  7.7*  --   --  8.1* 8.6*  MG  --   --  1.9  --   --  2.0 2.2  PHOS  --   --  3.2  --   --  3.0 4.2   GFR: Estimated Creatinine Clearance: 90.9 mL/min (by C-G formula based on SCr of 0.92 mg/dL). Recent Labs  Lab 11/04/21 0206 11/04/21 0500 11/04/21 0728 11/05/21 0226 11/06/21 0141 11/06/21 0454  WBC  --  51.5* 70.6* 36.4* 53.0* 42.7*  LATICACIDVEN 1.0 1.8  --   --   --   --     Liver Function Tests: Recent Labs  Lab 11/04/21 0202  AST 27  ALT 14  ALKPHOS 83  BILITOT 0.8  PROT 6.4*  ALBUMIN 3.2*   No results for input(s): LIPASE, AMYLASE in the last 168 hours. No results for input(s): AMMONIA in the last 168 hours.  ABG    Component Value Date/Time   PHART 7.453 (H) 11/04/2021 0546   PCO2ART 57.4 (H) 11/04/2021 0546   PO2ART 81 (L) 11/04/2021 0546   HCO3 36.3 (H) 11/04/2021 1005   TCO2 42 (H) 11/04/2021 0546   ACIDBASEDEF 28.0 (H) 03/06/2021 0810   O2SAT 75.4 11/04/2021 1005     Coagulation Profile: Recent Labs  Lab 11/04/21 0202  INR 1.1    Cardiac Enzymes: No results for input(s): CKTOTAL, CKMB, CKMBINDEX, TROPONINI in the last 168 hours.  HbA1C: Hgb A1c MFr Bld  Date/Time Value Ref Range Status  08/21/2021 08:13 AM 5.6 4.8 - 5.6 % Final    Comment:    (NOTE) Pre diabetes:          5.7%-6.4%  Diabetes:              >6.4%  Glycemic control for   <7.0% adults with diabetes   03/08/2021 04:40 AM 6.0 (H) 4.8 - 5.6 % Final    Comment:    (NOTE) Pre diabetes:           5.7%-6.4%  Diabetes:              >6.4%  Glycemic control for   <7.0% adults with diabetes  CBG: Recent Labs  Lab 11/05/21 1626 11/05/21 1954 11/05/21 2309 11/06/21 0325 11/06/21 0721  GLUCAP 123* 129* 93 117* 105*     Critical care time: 33 minutes    Hayden Pedro, AGACNP-BC [facility]  Pgr: [phone]  PCCM Pgr: [phone]

## 2021-11-06 NOTE — TOC Progression Note (Signed)
Transition of Care [facility]) - Initial/Assessment Note    Patient Details  Name: Isaac [name] MRN: [id number] Date of Birth: 1955-12-03  Transition of Care [facility] CM/SW Contact:    Milinda Antis, [place] Phone Number: 11/06/2021, 4:28 PM  Clinical Narrative:                  Transition of Care Department [facility]) has reviewed patient and no TOC needs have been identified at this time as patient required reintubation this afternoon.  We will continue to monitor patient advancement through interdisciplinary progression rounds. If new patient transition needs arise, please place a TOC consult.    Patient Goals and CMS Choice        Expected Discharge Plan and Services                                                Prior Living Arrangements/Services                       Activities of Daily Living      Permission Sought/Granted                  Emotional Assessment              Admission diagnosis:  Acute respiratory failure ([place]) [J96.00] Acute respiratory failure with hypercapnia ([place]) [J96.02] COVID-19 [U07.1] Patient Active Problem List   Diagnosis Date Noted   Acute respiratory failure ([place]) 11/04/2021   Pressure injury of skin 11/04/2021   Respiratory arrest ([place]) 08/21/2021   Wound infection after surgery 08/21/2021   Hypotension 08/21/2021   Acute on chronic respiratory failure with hypoxia and hypercapnia ([place]) 08/21/2021   Volume overload 08/21/2021   Acute on chronic respiratory failure ([place]) 03/04/2021   HCAP (healthcare-associated pneumonia) 03/04/2021   Acute on chronic respiratory failure with hypoxia (HCC)    COPD, severe (HCC)    Left lower lobe pneumonia    Chronic lymphocytic leukemia in remission ([place])    CLL (chronic lymphocytic leukemia) ([place])    Acute respiratory failure with hypercapnia ([place]) 12/08/2020   Hyperkalemia    Leukocytosis    AKI (acute kidney injury) ([place])    Healthcare maintenance 08/12/2020    Oral thrush 04/30/2017   Dysfunction of right eustachian tube 01/19/2016   Left ear impacted cerumen 01/19/2016   COPD, very severe ([place]) 08/22/2015   Chronic respiratory failure with hypercapnia ([place]) 06/08/2014   Unspecified sleep apnea 06/08/2014   Chronic diastolic heart failure ([place]) 05/11/2014   Acute-on-chronic respiratory failure ([place]) 04/24/2014   Sinusitis 04/13/2014   Renal insufficiency 07/07/2012   Hypertension 07/07/2012   COPD exacerbation ([place]) 07/04/2012   COPD, severe ([place]) 04/25/2012   Sleep apnea 04/25/2012   Obesity 04/25/2012   PCP:  [facility], [place]:   [id number] - [place], [place] [address] Phone: [phone] Fax: [phone]  [facility] [place] [address] [place] [address] Phone: [phone] Fax: [place] [address] [place] [address] Phone: [phone] Fax: [phone]     Social Determinants of Health (SDOH) Interventions    Readmission Risk Interventions No flowsheet data found.

## 2021-11-06 NOTE — Progress Notes (Signed)
Extubated earlier this morning. During the course of the day he developed increased WOB with tachypnea and tachycardia.  Tried on Bipap, but was sufficient.  Required reintubation.  Will arrange for CT angio chest to assess for extent of pulmonary infiltrates and assess for pulmonary embolism.  Updated pt's wife by phone about events and plan.  Additional CC time independent of procedure time 34 minutes.  Chesley Mires, MD [place] Pager - [phone] 11/06/2021, 3:19 PM

## 2021-11-06 NOTE — Progress Notes (Signed)
RT transported patient from 3M13 to CT and back with RN. No complications. RT will continue to monitor.

## 2021-11-06 NOTE — Procedures (Signed)
Intubation Procedure Note  Isaac [name]  [id number]  1956/09/20  Date:11/06/21  Time:2:58 PM   Provider Performing:Sherri Mcarthy [name] [name]    Procedure: Intubation (44628)  Indication(s) Respiratory Failure  Consent Risks of the procedure as well as the alternatives and risks of each were explained to the patient and/or caregiver.  Consent for the procedure was obtained and is signed in the bedside chart   Anesthesia Etomidate, Versed, Fentanyl, and Rocuronium   Time Out Verified patient identification, verified procedure, site/side was marked, verified correct patient position, special equipment/implants available, medications/allergies/relevant history reviewed, required imaging and test results available.   Sterile Technique Usual hand hygeine, masks, and gloves were used   Procedure Description Patient positioned in bed supine.  Sedation given as noted above.  Patient was intubated with endotracheal tube using Glidescope.  View was Grade 1 full glottis .  Number of attempts was 1.  Colorimetric CO2 detector was consistent with tracheal placement.   Complications/Tolerance None; patient tolerated the procedure well. Chest X-ray is ordered to verify placement.   EBL Minimal   Specimen(s) None

## 2021-11-06 NOTE — Progress Notes (Signed)
[place] Progress Note Patient Name: Isaac [name] DOB: [birth year] MRN: [id number]   Date of Service  11/06/2021  HPI/Events of Note  Worsening leukocytosis. WBC 53 Afebrile Bcx 12/10 NGTD  eICU Interventions  Add differential        [name] 11/06/2021, 4:09 AM

## 2021-11-06 NOTE — Progress Notes (Signed)
PT Cancellation Note  Patient Details Name: Isaac [name] MRN: [id number] DOB: November 30, 1955   Cancelled Treatment:    Reason Eval/Treat Not Completed: Medical issues which prohibited therapy.  Pt getting prepared for intubation on arrival, BP  >=220. 11/06/2021  [name], PT Acute Rehabilitation Services [phone]  (pager) [phone]  (office)   [name] 11/06/2021, 3:20 PM

## 2021-11-06 NOTE — Progress Notes (Signed)
[facility] e[facility] Physician-Brief Progress Note Patient Name: Isaac [name] DOB: [birth year] MRN: [id number]   Date of Service  11/06/2021  HPI/Events of Note  Patient requesting PRN IM Vistaril or itching.  eICU Interventions  PRN IM Vistrail ordered x 2 doses.        [name] Deangela Randleman 11/06/2021, 10:02 PM

## 2021-11-06 NOTE — Progress Notes (Incomplete)
{  Select Note:[id number]} 

## 2021-11-06 NOTE — Progress Notes (Signed)
[place] Progress Note Patient Name: Isaac [name] DOB: November 22, 1956 MRN: [id number]   Date of Service  11/06/2021  HPI/Events of Note  Restlessness. Not agitated On fentanyl Neck rash keeps irritating patient  eICU Interventions  Ativan 1 mg once Restart home gabapentin 100 mg daily Steroid cream for rash     Intervention Category Minor Interventions: Agitation / anxiety - evaluation and management  Samarie Pinder Rodman Pickle 11/06/2021, 12:55 AM

## 2021-11-06 NOTE — Procedures (Signed)
Extubation Procedure Note  Patient Details:   Name: ERMINE SPOFFORD DOB: 10-05-1956 MRN: [id number]   Airway Documentation:    Vent end date: 11/06/21 Vent end time: 1130   Evaluation  O2 sats: stable throughout Complications: No apparent complications Patient did tolerate procedure well. Bilateral Breath Sounds: Clear, Diminished   Yes  RT extubated patient to 5L [place] per MD order with RN at bedside. Positive cuff leak noted. Patient tolerated well. No stridor noted at this time. RT will continue to monitor as needed.   Fabiola Backer 11/06/2021, 11:42 AM

## 2021-11-06 NOTE — Progress Notes (Signed)
Lower extremity venous has been completed.   Preliminary results in CV Proc.   Isaac [name] Isaac [name] 11/06/2021 2:30 PM

## 2021-11-06 NOTE — Progress Notes (Signed)
RT placed patient on bipap through servo. RN and NP at bedside.

## 2021-11-07 ENCOUNTER — Inpatient Hospital Stay (HOSPITAL_COMMUNITY): Payer: BC Managed Care – PPO

## 2021-11-07 DIAGNOSIS — I5032 Chronic diastolic (congestive) heart failure: Secondary | ICD-10-CM

## 2021-11-07 DIAGNOSIS — J9602 Acute respiratory failure with hypercapnia: Secondary | ICD-10-CM | POA: Diagnosis not present

## 2021-11-07 DIAGNOSIS — J9601 Acute respiratory failure with hypoxia: Secondary | ICD-10-CM | POA: Diagnosis not present

## 2021-11-07 LAB — CBC WITH DIFFERENTIAL/PLATELET
Abs Immature Granulocytes: 0 10*3/uL (ref 0.00–0.07)
Basophils Absolute: 0 10*3/uL (ref 0.0–0.1)
Basophils Relative: 0 %
Eosinophils Absolute: 0 10*3/uL (ref 0.0–0.5)
Eosinophils Relative: 0 %
HCT: 34.2 % — ABNORMAL LOW (ref 39.0–52.0)
Hemoglobin: 10 g/dL — ABNORMAL LOW (ref 13.0–17.0)
Lymphocytes Relative: 68 %
Lymphs Abs: 29.4 10*3/uL — ABNORMAL HIGH (ref 0.7–4.0)
MCH: 25.6 pg — ABNORMAL LOW (ref 26.0–34.0)
MCHC: 29.2 g/dL — ABNORMAL LOW (ref 30.0–36.0)
MCV: 87.7 fL (ref 80.0–100.0)
Monocytes Absolute: 1.7 10*3/uL — ABNORMAL HIGH (ref 0.1–1.0)
Monocytes Relative: 4 %
Neutro Abs: 12.1 10*3/uL — ABNORMAL HIGH (ref 1.7–7.7)
Neutrophils Relative %: 28 %
Platelets: 164 10*3/uL (ref 150–400)
RBC: 3.9 MIL/uL — ABNORMAL LOW (ref 4.22–5.81)
RDW: 14.5 % (ref 11.5–15.5)
WBC: 43.2 10*3/uL — ABNORMAL HIGH (ref 4.0–10.5)
nRBC: 0 % (ref 0.0–0.2)
nRBC: 0 /100 WBC

## 2021-11-07 LAB — BASIC METABOLIC PANEL
Anion gap: 10 (ref 5–15)
BUN: 36 mg/dL — ABNORMAL HIGH (ref 8–23)
CO2: 31 mmol/L (ref 22–32)
Calcium: 8.2 mg/dL — ABNORMAL LOW (ref 8.9–10.3)
Chloride: 100 mmol/L (ref 98–111)
Creatinine, Ser: 1.04 mg/dL (ref 0.61–1.24)
GFR, Estimated: >60 mL/min (ref >60)
Glucose, Bld: 118 mg/dL — ABNORMAL HIGH (ref 70–99)
Potassium: 3.9 mmol/L (ref 3.5–5.1)
Sodium: 141 mmol/L (ref 135–145)

## 2021-11-07 LAB — PHOSPHORUS: Phosphorus: 3.2 mg/dL (ref 2.5–4.6)

## 2021-11-07 LAB — GLUCOSE, CAPILLARY
Glucose-Capillary: 104 mg/dL — ABNORMAL HIGH (ref 70–99)
Glucose-Capillary: 109 mg/dL — ABNORMAL HIGH (ref 70–99)
Glucose-Capillary: 110 mg/dL — ABNORMAL HIGH (ref 70–99)
Glucose-Capillary: 115 mg/dL — ABNORMAL HIGH (ref 70–99)
Glucose-Capillary: 134 mg/dL — ABNORMAL HIGH (ref 70–99)
Glucose-Capillary: 135 mg/dL — ABNORMAL HIGH (ref 70–99)
Glucose-Capillary: 159 mg/dL — ABNORMAL HIGH (ref 70–99)

## 2021-11-07 LAB — MAGNESIUM: Magnesium: 2.3 mg/dL (ref 1.7–2.4)

## 2021-11-07 LAB — PATHOLOGIST SMEAR REVIEW

## 2021-11-07 LAB — LEGIONELLA PNEUMOPHILA SEROGP 1 UR AG: L. pneumophila Serogp 1 Ur Ag: NEGATIVE

## 2021-11-07 LAB — ECHOCARDIOGRAM LIMITED: Height: 70 in

## 2021-11-07 MED ORDER — ALBUTEROL SULFATE (2.5 MG/3ML) 0.083% IN NEBU
2.5000 mg | INHALATION_SOLUTION | RESPIRATORY_TRACT | Status: DC | PRN
  Administered 2021-11-08 – 2021-11-13 (×9): 2.5 mg via RESPIRATORY_TRACT
  Filled 2021-11-07 (×9): qty 3

## 2021-11-07 MED ORDER — PANTOPRAZOLE 2 MG/ML SUSPENSION
40.0000 mg | Freq: Every day | ORAL | Status: DC
  Administered 2021-11-07 – 2021-11-14 (×7): 40 mg
  Filled 2021-11-07 (×7): qty 20

## 2021-11-07 MED ORDER — PROSOURCE TF PO LIQD
45.0000 mL | Freq: Four times a day (QID) | ORAL | Status: DC
  Administered 2021-11-07 – 2021-11-15 (×27): 45 mL
  Filled 2021-11-07 (×25): qty 45

## 2021-11-07 MED ORDER — REVEFENACIN 175 MCG/3ML IN SOLN
175.0000 ug | Freq: Every day | RESPIRATORY_TRACT | Status: DC
  Administered 2021-11-08 – 2021-11-15 (×8): 175 ug via RESPIRATORY_TRACT
  Filled 2021-11-07 (×11): qty 3

## 2021-11-07 MED ORDER — ARFORMOTEROL TARTRATE 15 MCG/2ML IN NEBU
15.0000 ug | INHALATION_SOLUTION | Freq: Two times a day (BID) | RESPIRATORY_TRACT | Status: DC
  Administered 2021-11-07 – 2021-11-15 (×14): 15 ug via RESPIRATORY_TRACT
  Filled 2021-11-07 (×13): qty 2

## 2021-11-07 MED ORDER — VITAL 1.5 CAL PO LIQD
1000.0000 mL | ORAL | Status: DC
Start: 2021-11-07 — End: 2021-11-11
  Administered 2021-11-07 – 2021-11-10 (×2): 1000 mL

## 2021-11-07 MED ORDER — FREE WATER
200.0000 mL | Freq: Three times a day (TID) | Status: DC
  Administered 2021-11-07 – 2021-11-15 (×20): 200 mL

## 2021-11-07 MED ORDER — DIPHENHYDRAMINE HCL 50 MG/ML IJ SOLN
50.0000 mg | Freq: Four times a day (QID) | INTRAMUSCULAR | Status: DC | PRN
  Administered 2021-11-07 – 2021-11-08 (×4): 50 mg via INTRAVENOUS
  Filled 2021-11-07 (×5): qty 1

## 2021-11-07 MED ORDER — METOPROLOL TARTRATE 25 MG PO TABS
25.0000 mg | ORAL_TABLET | Freq: Four times a day (QID) | ORAL | Status: DC
  Administered 2021-11-07 – 2021-11-15 (×27): 25 mg
  Filled 2021-11-07 (×28): qty 1

## 2021-11-07 NOTE — Progress Notes (Signed)
Initial Nutrition Assessment  DOCUMENTATION CODES:   Not applicable  INTERVENTION:   Tube feeding via OG tube: - Change to Vital 1.5 @ 50 ml/hr (1200 ml/day) - ProSource TF 45 ml QID - Free water flushes of 200 ml q 8 hours  Tube feeding regimen provides 1960 kcal, 125 grams of protein, and 917 ml of H2O.  Total free water with flushes: 1517 ml  NUTRITION DIAGNOSIS:   Increased nutrient needs related to acute illness (COVID pneumonia) as evidenced by estimated needs.  GOAL:   Patient will meet greater than or equal to 90% of their needs  MONITOR:   Vent status, Labs, Weight trends, TF tolerance, Skin  REASON FOR ASSESSMENT:   Ventilator, Consult Enteral/tube feeding initiation and management  ASSESSMENT:   65 year old male who presented to the ED on 12/10 with respiratory distress after testing positive for COVID-19. Pt required intubation. PMH of CLL and COPD. Pt admitted with acute respiratory failure, COVID pneumonia.  12/12 - extubated, later reintubated  Discussed pt with RN and during ICU rounds.  Consult received for tube feeding initiation and management. Pt with OG tube in gastric antrum per abdominal x-ray from 12/12. Pt currently receiving Vital High Protein @ 40 ml/hr with ProSource TF 45 ml BID per Adult Tube Feeding Protocol. RD to adjust to better meet pt's needs.  Unable to obtain diet and weight history at this time. Reviewed available weight history in chart. Noted pt with a total weight loss of 25.1 kg since 12/19/20. This is a 21.1% weight loss in less than 11 months which is severe and significant for timeframe. Pt is at risk for malnutrition.  Pt with non-pitting edema to BLE per nursing documentation.  Patient is currently intubated on ventilator support MV: 7.0 L/min Temp (24hrs), Avg:99.2 F (37.3 C), Min:98.3 F (36.8 C), Max:99.7 F (37.6 C)  Drips: Fentanyl  Medications reviewed and include: IV decadron, colace, SSI q 4 hours,  protonix, miralax, IV abx, remdesivir  Labs reviewed: BUN 36, hemoglobin 10.0, WBC 43.2 CBG's: 104-173 x 24 hours  UOP: 700 ml x 24 hours I/O's: +5.8 L since admit  NUTRITION - FOCUSED PHYSICAL EXAM:  Unable to complete at this time.  Diet Order:   Diet Order             Diet NPO time specified  Diet effective now                   EDUCATION NEEDS:   Not appropriate for education at this time  Skin:  Skin Assessment: Skin Integrity Issues: Stage I: buttocks  Last BM:  no documented BM  Height:   Ht Readings from Last 1 Encounters:  11/04/21 5\' 10"  (1.778 m)    Weight:   Wt Readings from Last 1 Encounters:  10/27/21 94 kg    BMI:  Body mass index is 29.73 kg/m.  Estimated Nutritional Needs:   Kcal:  1800-2000  Protein:  120-140 grams  Fluid:  1.8-2.0 L    Gustavus Bryant, MS, RD, LDN Inpatient Clinical Dietitian Please see AMiON for contact information.

## 2021-11-07 NOTE — Progress Notes (Signed)
°  Echocardiogram 2D Echocardiogram has been performed.  Merrie Roof F 11/07/2021, 4:03 PM

## 2021-11-07 NOTE — Progress Notes (Signed)
NAME:  Isaac [name], MRN:  [id number], DOB:  21-Sep-1956, LOS: 3 ADMISSION DATE:  11/04/2021, CONSULTATION DATE: 11/04/2021 REFERRING MD: Emergency room, CHIEF COMPLAINT: Acute respiratory failure  History of Present Illness:  65 yo male presented with dyspnea, fever, fatigue.  He required intubation in ER.  Positive for COVID 19.  Hospitalized in January 2022, April 2022 and September 2022 for AECOPD and PNA.  He has history of tracheostomy.  He has been on ibrutinib for CLL.  Followed by Dr. Chase Caller in pulmonary office for severe COPD.  Pertinent  Medical History  COPD, CLL, Anxiety, HTN, GERD  Significant Hospital Events:   12/10 Presents to ED requiring intubation  12/12 Extubated >> required reintubation  Studies:  PFT 06/08/14 >> FEV1 0.90 (24%), FEV1% 42, TLC 5.16 (72%), DLCO 26% CT angio chest 11/06/21 >> Rt greater than Lt patchy b/l GGO, emphysema, atherosclerosis  Interim history/Subjective:  On pressure support.  Feels anxious.  Objective   Blood pressure (!) 157/86, pulse (!) 107, temperature 98.3 F (36.8 C), temperature source Oral, resp. rate (!) 22, height 5\' 10"  (1.778 m), SpO2 91 %.    Vent Mode: CPAP;PSV FiO2 (%):  [40 %-60 %] 40 % Set Rate:  [12 bmp-22 bmp] 22 bmp Vt Set:  [580 mL] 580 mL PEEP:  [5 cmH20-8 cmH20] 5 cmH20 Pressure Support:  [10 cmH20] 10 cmH20 Plateau Pressure:  [20 cmH20-21 cmH20] 20 cmH20   Intake/Output Summary (Last 24 hours) at 11/07/2021 1108 Last data filed at 11/07/2021 0956 Gross per 24 hour  Intake 1181.11 ml  Output 775 ml  Net 406.11 ml   There were no vitals filed for this visit.  Examination:  General - alert Eyes - pupils reactive ENT - ETT in place Cardiac - regular rate/rhythm, no murmur Chest - decreased BS Abdomen - soft, non tender, + bowel sounds Extremities - no cyanosis, clubbing, or edema Skin - no rashes Neuro - follows commands, RASS -1  Discussion:  He has severe COPD with emphysema and  recurrent respiratory infections.  He failed extubation trial on 11/06/21.  I am concerned given the severity of his COPD he will need more prolonged, intermittent ventilator support.  He had tracheostomy previously, and might need this again to assist with vent weaning.  Assessment & Plan:   Acute hypoxic/hypercapnic respiratory failure. - pressure support wean  - goal SpO2 > 90% - will need to d/w family about whether repeat trach should be considered   Sepsis from [place] 19 pneumonia with possible bacterial pneumonia. - day 4 of remdesivir/decadron - day 5 of rocephin/zithromax   Hx of COPD. - continue pulmicort - change to yupelri and brovana - prn albuterol   Hx of CLL. - diagnosed in 2019 - followed by Dr. Zola Button with oncology - hold outpt imbruvica - continue allopurinol  Anemia of critical illness and chronic disease. - f/u CBC - transfuse for Hb < 7   Sedation while on vent. Anxiety. - RASS goal 0 to -1   Hx of HTN. - continue lopressor - prn hydralazine with goal SBP < 170   Pressure injury. - stage 1 buttock, POA   Best Practice (right click and "Reselect all SmartList Selections" daily)   Diet/type: tube feeds DVT prophylaxis: Lovenox  GI prophylaxis: protonix Lines: N/A Foley:  Yes, and it is still needed Code Status:  full code Last date of multidisciplinary goals of care discussion updated pt's wife by phone on 11/06/21  Labs  CMP Latest Ref Rng & Units 11/07/2021 11/06/2021 11/06/2021  Glucose 70 - 99 mg/dL 118(H) - 116(H)  BUN 8 - 23 mg/dL 36(H) - 25(H)  Creatinine 0.61 - 1.24 mg/dL 1.04 - 0.92  Sodium 135 - 145 mmol/L 141 140 141  Potassium 3.5 - 5.1 mmol/L 3.9 3.8 3.9  Chloride 98 - 111 mmol/L 100 - 95(L)  CO2 22 - 32 mmol/L 31 - 34(H)  Calcium 8.9 - 10.3 mg/dL 8.2(L) - 8.6(L)  Total Protein 6.5 - 8.1 g/dL - - -  Total Bilirubin 0.3 - 1.2 mg/dL - - -  Alkaline Phos 38 - 126 U/L - - -  AST 15 - 41 U/L - - -  ALT 0 - 44 U/L - - -     CBC Latest Ref Rng & Units 11/07/2021 11/06/2021 11/06/2021  WBC 4.0 - 10.5 K/uL 43.2(H) - 42.7(H)  Hemoglobin 13.0 - 17.0 g/dL 10.0(L) 11.6(L) 10.4(L)  Hematocrit 39.0 - 52.0 % 34.2(L) 34.0(L) 35.1(L)  Platelets 150 - 400 K/uL 164 - 154    ABG    Component Value Date/Time   PHART 7.385 11/06/2021 1539   PCO2ART 64.4 (H) 11/06/2021 1539   PO2ART 138 (H) 11/06/2021 1539   HCO3 38.4 (H) 11/06/2021 1539   TCO2 40 (H) 11/06/2021 1539   ACIDBASEDEF 28.0 (H) 03/06/2021 0810   O2SAT 99.0 11/06/2021 1539    CBG (last 3)  Recent Labs    11/07/21 0328 11/07/21 0806 11/07/21 1120  GLUCAP 115* 109* 134*   Critical care time: 35 minutes  Chesley Mires, MD [place] Pager - [phone] 11/07/2021, 11:30 AM

## 2021-11-08 DIAGNOSIS — J9602 Acute respiratory failure with hypercapnia: Secondary | ICD-10-CM | POA: Diagnosis not present

## 2021-11-08 DIAGNOSIS — J9601 Acute respiratory failure with hypoxia: Secondary | ICD-10-CM | POA: Diagnosis not present

## 2021-11-08 LAB — CBC WITH DIFFERENTIAL/PLATELET
Abs Immature Granulocytes: 0.14 10*3/uL — ABNORMAL HIGH (ref 0.00–0.07)
Basophils Absolute: 0 10*3/uL (ref 0.0–0.1)
Basophils Relative: 0 %
Eosinophils Absolute: 0 10*3/uL (ref 0.0–0.5)
Eosinophils Relative: 0 %
HCT: 36 % — ABNORMAL LOW (ref 39.0–52.0)
Hemoglobin: 10.4 g/dL — ABNORMAL LOW (ref 13.0–17.0)
Immature Granulocytes: 0 %
Lymphocytes Relative: 90 %
Lymphs Abs: 52.9 10*3/uL — ABNORMAL HIGH (ref 0.7–4.0)
MCH: 25.7 pg — ABNORMAL LOW (ref 26.0–34.0)
MCHC: 28.9 g/dL — ABNORMAL LOW (ref 30.0–36.0)
MCV: 89.1 fL (ref 80.0–100.0)
Monocytes Absolute: 1.6 10*3/uL — ABNORMAL HIGH (ref 0.1–1.0)
Monocytes Relative: 3 %
Neutro Abs: 3.8 10*3/uL (ref 1.7–7.7)
Neutrophils Relative %: 7 %
Platelets: 187 10*3/uL (ref 150–400)
RBC: 4.04 MIL/uL — ABNORMAL LOW (ref 4.22–5.81)
RDW: 14.5 % (ref 11.5–15.5)
WBC: 58.5 10*3/uL (ref 4.0–10.5)
nRBC: 0 % (ref 0.0–0.2)

## 2021-11-08 LAB — BASIC METABOLIC PANEL
Anion gap: 7 (ref 5–15)
BUN: 34 mg/dL — ABNORMAL HIGH (ref 8–23)
CO2: 33 mmol/L — ABNORMAL HIGH (ref 22–32)
Calcium: 7.9 mg/dL — ABNORMAL LOW (ref 8.9–10.3)
Chloride: 99 mmol/L (ref 98–111)
Creatinine, Ser: 0.8 mg/dL (ref 0.61–1.24)
GFR, Estimated: >60 mL/min (ref >60)
Glucose, Bld: 137 mg/dL — ABNORMAL HIGH (ref 70–99)
Potassium: 3.8 mmol/L (ref 3.5–5.1)
Sodium: 139 mmol/L (ref 135–145)

## 2021-11-08 LAB — GLUCOSE, CAPILLARY
Glucose-Capillary: 124 mg/dL — ABNORMAL HIGH (ref 70–99)
Glucose-Capillary: 131 mg/dL — ABNORMAL HIGH (ref 70–99)
Glucose-Capillary: 136 mg/dL — ABNORMAL HIGH (ref 70–99)
Glucose-Capillary: 163 mg/dL — ABNORMAL HIGH (ref 70–99)
Glucose-Capillary: 175 mg/dL — ABNORMAL HIGH (ref 70–99)
Glucose-Capillary: 177 mg/dL — ABNORMAL HIGH (ref 70–99)

## 2021-11-08 LAB — MAGNESIUM: Magnesium: 2.5 mg/dL — ABNORMAL HIGH (ref 1.7–2.4)

## 2021-11-08 LAB — CULTURE, RESPIRATORY W GRAM STAIN

## 2021-11-08 LAB — PHOSPHORUS: Phosphorus: 3.4 mg/dL (ref 2.5–4.6)

## 2021-11-08 MED ORDER — QUETIAPINE FUMARATE 25 MG PO TABS: 12.5000 mg | ORAL_TABLET | Freq: Three times a day (TID) | ORAL | Status: DC | PRN

## 2021-11-08 MED ORDER — ROCURONIUM BROMIDE 50 MG/5ML IV SOLN
60.0000 mg | Freq: Once | INTRAVENOUS | Status: AC
  Filled 2021-11-08 (×2): qty 6

## 2021-11-08 MED ORDER — GABAPENTIN 250 MG/5ML PO SOLN
100.0000 mg | Freq: Every day | ORAL | Status: DC
  Administered 2021-11-09 – 2021-11-15 (×6): 100 mg
  Filled 2021-11-08 (×8): qty 2

## 2021-11-08 MED ORDER — MIDAZOLAM HCL 2 MG/2ML IJ SOLN
5.0000 mg | Freq: Once | INTRAMUSCULAR | Status: DC
  Filled 2021-11-08: qty 6

## 2021-11-08 MED ORDER — QUETIAPINE FUMARATE 25 MG PO TABS
12.5000 mg | ORAL_TABLET | Freq: Three times a day (TID) | ORAL | Status: AC | PRN
  Administered 2021-11-08 (×3): 12.5 mg
  Filled 2021-11-08 (×3): qty 1

## 2021-11-08 MED ORDER — FENTANYL CITRATE (PF) 100 MCG/2ML IJ SOLN
200.0000 ug | Freq: Once | INTRAMUSCULAR | Status: DC
  Filled 2021-11-08: qty 4

## 2021-11-08 MED ORDER — HYDROXYZINE HCL 10 MG/5ML PO SYRP
50.0000 mg | ORAL_SOLUTION | Freq: Three times a day (TID) | ORAL | Status: AC | PRN
  Administered 2021-11-09 – 2021-11-13 (×6): 50 mg
  Filled 2021-11-08 (×12): qty 25

## 2021-11-08 MED ORDER — ETOMIDATE 2 MG/ML IV SOLN
20.0000 mg | Freq: Once | INTRAVENOUS | Status: DC
  Filled 2021-11-08: qty 10

## 2021-11-08 NOTE — Progress Notes (Signed)
[place] Progress Note Patient Name: Isaac [name] DOB: October 05, 1956 MRN: [id number]   Date of Service  11/08/2021  HPI/Events of Note  Patient with anxiety on the ventilator.  eICU Interventions  Seroquel 12.5 mg tid ordered to be given PRN anxiety.        [name] [name] 11/08/2021, 12:49 AM

## 2021-11-08 NOTE — Progress Notes (Signed)
NAME:  Isaac [name], MRN:  [id number], DOB:  [birth year], LOS: 4 ADMISSION DATE:  11/04/2021, CONSULTATION DATE: 11/04/2021 REFERRING MD: Emergency room, CHIEF COMPLAINT: Acute respiratory failure  History of Present Illness:  65 yo male presented with dyspnea, fever, fatigue.  He required intubation in ER.  Positive for COVID 19.  Hospitalized in January 2022, April 2022 and September 2022 for AECOPD and PNA.  He has history of tracheostomy.  He has been on ibrutinib for CLL.  Followed by Dr. Chase Caller in pulmonary office for severe COPD.  Pertinent  Medical History  COPD, CLL, Anxiety, HTN, GERD  Significant Hospital Events:   12/10 Presents to ED requiring intubation  12/12 Extubated >> required reintubation  Studies:  PFT 06/08/14 >> FEV1 0.90 (24%), FEV1% 42, TLC 5.16 (72%), DLCO 26% CT angio chest 11/06/21 >> Rt greater than Lt patchy b/l GGO, emphysema, atherosclerosis  Interim history/Subjective:  Intermittently feeling itchy and anxious.  Tolerating pressure support 10/5.  Objective   Blood pressure (!) 130/91, pulse 91, temperature 99.5 F (37.5 C), temperature source Bladder, resp. rate (!) 22, height 5\' 10"  (1.778 m), SpO2 95 %.    Vent Mode: PRVC FiO2 (%):  [40 %] 40 % Set Rate:  [22 bmp] 22 bmp Vt Set:  [580 mL] 580 mL PEEP:  [5 cmH20] 5 cmH20 Pressure Support:  [10 cmH20] 10 cmH20 Plateau Pressure:  [22 cmH20] 22 cmH20   Intake/Output Summary (Last 24 hours) at 11/08/2021 9379 Last data filed at 11/08/2021 0600 Gross per 24 hour  Intake 1332.81 ml  Output 1141 ml  Net 191.81 ml   There were no vitals filed for this visit.  Examination:  General - alert Eyes - pupils reactive ENT - ETT in place Cardiac - regular rate/rhythm, no murmur Chest - decreased breath sounds b/l, no wheezing or rales Abdomen - soft, non tender, + bowel sounds Extremities - no cyanosis, clubbing, or edema Skin - no rashes Neuro - RASS 0  Discussion:  He has severe COPD  with emphysema and recurrent respiratory infections.  He failed extubation trial on 11/06/21.  I am concerned given the severity of his COPD he will need more prolonged, intermittent ventilator support.  He had tracheostomy previously, and might need this again to assist with vent weaning.  He is very reluctant to go through the process like what happened on 11/06/21 when he was extubated and then required reintubation.  Assessment & Plan:   Acute hypoxic/hypercapnic respiratory failure. - pressure support as tolerated - he is considering whether he would want to do extubation trial again or proceed straight to tracheostomy - f/u CXR intermittently - goal SpO2 > 90%   Sepsis from COVID 19 pneumonia with possible bacterial pneumonia. - day 5 of remdesivir and decadron - completed Abx onf 12/13   Hx of COPD. - continue pulmicort, yupelri, brovana - prn albuterol   Hx of CLL. - diagnosed in 2019 - followed by Dr. Zola Button with oncology - hold outpt imbruvica - continue allopurinol  Anemia of critical illness and chronic disease. - f/u CBC - transfuse for Hb < 7   Sedation while on vent. Anxiety. - RASS goal 0 to -1 - prn versed   Hx of HTN. - continue lopressor - prn hydralazine with goal SBP < 170   Pressure injury. - stage 1 buttock, POA   Best Practice (right click and "Reselect all SmartList Selections" daily)   Diet/type: tube feeds DVT prophylaxis: Lovenox  GI prophylaxis:  protonix Lines: N/A Foley:  Yes, and it is still needed Code Status:  full code Last date of multidisciplinary goals of care discussion: updated pt's wife at bedside.  Labs    CMP Latest Ref Rng & Units 11/07/2021 11/06/2021 11/06/2021  Glucose 70 - 99 mg/dL 118(H) - 116(H)  BUN 8 - 23 mg/dL 36(H) - 25(H)  Creatinine 0.61 - 1.24 mg/dL 1.04 - 0.92  Sodium 135 - 145 mmol/L 141 140 141  Potassium 3.5 - 5.1 mmol/L 3.9 3.8 3.9  Chloride 98 - 111 mmol/L 100 - 95(L)  CO2 22 - 32 mmol/L 31  - 34(H)  Calcium 8.9 - 10.3 mg/dL 8.2(L) - 8.6(L)  Total Protein 6.5 - 8.1 g/dL - - -  Total Bilirubin 0.3 - 1.2 mg/dL - - -  Alkaline Phos 38 - 126 U/L - - -  AST 15 - 41 U/L - - -  ALT 0 - 44 U/L - - -    CBC Latest Ref Rng & Units 11/07/2021 11/06/2021 11/06/2021  WBC 4.0 - 10.5 K/uL 43.2(H) - 42.7(H)  Hemoglobin 13.0 - 17.0 g/dL 10.0(L) 11.6(L) 10.4(L)  Hematocrit 39.0 - 52.0 % 34.2(L) 34.0(L) 35.1(L)  Platelets 150 - 400 K/uL 164 - 154    ABG    Component Value Date/Time   PHART 7.385 11/06/2021 1539   PCO2ART 64.4 (H) 11/06/2021 1539   PO2ART 138 (H) 11/06/2021 1539   HCO3 38.4 (H) 11/06/2021 1539   TCO2 40 (H) 11/06/2021 1539   ACIDBASEDEF 28.0 (H) 03/06/2021 0810   O2SAT 99.0 11/06/2021 1539    CBG (last 3)  Recent Labs    11/07/21 2345 11/08/21 0400 11/08/21 0712  GLUCAP 135* 131* 136*   Critical care time: 32 minutes  Chesley Mires, MD [place] Pager - [phone] 11/08/2021, 8:22 AM

## 2021-11-08 NOTE — Progress Notes (Signed)
[facility] Progress Note Patient Name: Isaac [name] DOB: 1956-04-18 MRN: [id number]   Date of Service  11/08/2021  HPI/Events of Note  Patient with itching not relieved by Benadryl iv PRN.  eICU Interventions  Benadryl discontinued and replaced by PRN po Atarax.        [name] Willies Laviolette 11/08/2021, 9:29 PM

## 2021-11-09 ENCOUNTER — Inpatient Hospital Stay (HOSPITAL_COMMUNITY): Payer: BC Managed Care – PPO

## 2021-11-09 DIAGNOSIS — Z9911 Dependence on respirator [ventilator] status: Secondary | ICD-10-CM

## 2021-11-09 LAB — CULTURE, BLOOD (ROUTINE X 2)
Culture: NO GROWTH
Culture: NO GROWTH
Special Requests: ADEQUATE

## 2021-11-09 LAB — GLUCOSE, CAPILLARY
Glucose-Capillary: 99 mg/dL (ref 70–99)
Glucose-Capillary: 94 mg/dL (ref 70–99)
Glucose-Capillary: 107 mg/dL — ABNORMAL HIGH (ref 70–99)
Glucose-Capillary: 122 mg/dL — ABNORMAL HIGH (ref 70–99)
Glucose-Capillary: 83 mg/dL (ref 70–99)
Glucose-Capillary: 88 mg/dL (ref 70–99)

## 2021-11-09 MED ORDER — MIDAZOLAM HCL 2 MG/2ML IJ SOLN
5.0000 mg | Freq: Once | INTRAMUSCULAR | Status: AC
  Administered 2021-11-09: 2 mg via INTRAVENOUS

## 2021-11-09 MED ORDER — NOREPINEPHRINE 4 MG/250ML-% IV SOLN: 0.0000 ug/min | INTRAVENOUS | Status: DC

## 2021-11-09 MED ORDER — ROCURONIUM BROMIDE 10 MG/ML (PF) SYRINGE
PREFILLED_SYRINGE | INTRAVENOUS | Status: AC
  Administered 2021-11-09: 100 mg via INTRAVENOUS
  Filled 2021-11-09: qty 10

## 2021-11-09 MED ORDER — FENTANYL CITRATE (PF) 100 MCG/2ML IJ SOLN: 200.0000 ug | Freq: Once | INTRAMUSCULAR | Status: AC

## 2021-11-09 MED ORDER — ALBUTEROL SULFATE (2.5 MG/3ML) 0.083% IN NEBU
2.5000 mg | INHALATION_SOLUTION | RESPIRATORY_TRACT | Status: DC
  Administered 2021-11-10 – 2021-11-12 (×13): 2.5 mg via RESPIRATORY_TRACT
  Filled 2021-11-09 (×14): qty 3

## 2021-11-09 MED ORDER — ETOMIDATE 2 MG/ML IV SOLN
20.0000 mg | Freq: Once | INTRAVENOUS | Status: AC
  Administered 2021-11-09: 20 mg via INTRAVENOUS

## 2021-11-09 MED ORDER — NOREPINEPHRINE 4 MG/250ML-% IV SOLN
INTRAVENOUS | Status: AC
  Administered 2021-11-09: 2 ug/min via INTRAVENOUS
  Filled 2021-11-09: qty 250

## 2021-11-09 NOTE — Procedures (Signed)
Diagnostic Bronchoscopy  Isaac [name]  [id number]  Oct 05, 1956  Date:11/09/21  Time:10:44 AM   Provider Performing:Adetokunbo Mccadden   Procedure: Diagnostic Bronchoscopy (86578)  Indication(s) Assist with direct visualization of tracheostomy placement  Consent Risks of the procedure as well as the alternatives and risks of each were explained to the patient and/or caregiver.  Consent for the procedure was obtained.   Anesthesia See separate tracheostomy note   Time Out Verified patient identification, verified procedure, site/side was marked, verified correct patient position, special equipment/implants available, medications/allergies/relevant history reviewed, required imaging and test results available.   Sterile Technique Usual hand hygiene, masks, gowns, and gloves were used   Procedure Description Bronchoscope advanced through endotracheal tube and into airway.  After suctioning out tracheal secretions, bronchoscope used to provide direct visualization of tracheostomy placement.   Complications/Tolerance None; patient tolerated the procedure well.   EBL None  Specimen(s) None

## 2021-11-09 NOTE — Progress Notes (Signed)
SLP Cancellation Note  Patient Details Name: Isaac [name] MRN: [id number] DOB: 11-15-1956   Cancelled treatment:       Reason Eval/Treat Not Completed: Patient not medically ready Patient with new tracheostomy 12/15 and is currently on the vent. Orders for SLP eval and treat for PMSV and swallowing received. Will follow pt closely for readiness for SLP interventions as appropriate.   [place] Paiz I. Hardin Negus, [place], [place] Office number [phone] Pager [place] 11/09/2021, 4:22 PM

## 2021-11-09 NOTE — TOC Progression Note (Addendum)
Transition of Care [facility]) - Initial/Assessment Note    Patient Details  Name: Isaac [name] MRN: [id number] Date of Birth: 1956-11-02  Transition of Care [facility]) CM/SW Contact:    Milinda Antis, [place] Phone Number: 11/09/2021, 4:40 PM  Clinical Narrative:                 TOC following patient for any d/c planning needs once medically stable.  MD recommending LTACH.  Pending PT/OT assessments and recommendations.  Lind Covert, MSW, LCSWA         Patient Goals and CMS Choice        Expected Discharge Plan and Services                                                Prior Living Arrangements/Services                       Activities of Daily Living      Permission Sought/Granted                  Emotional Assessment              Admission diagnosis:  Acute respiratory failure ([place]) [J96.00] Acute respiratory failure with hypercapnia ([employer]) [J96.02] COVID-19 [U07.1] Patient Active Problem List   Diagnosis Date Noted   Acute respiratory failure ([place]) 11/04/2021   Pressure injury of skin 11/04/2021   Respiratory arrest ([place]) 08/21/2021   Wound infection after surgery 08/21/2021   Hypotension 08/21/2021   Acute on chronic respiratory failure with hypoxia and hypercapnia ([place]) 08/21/2021   Volume overload 08/21/2021   Acute on chronic respiratory failure ([place]) 03/04/2021   HCAP (healthcare-associated pneumonia) 03/04/2021   Acute on chronic respiratory failure with hypoxia (HCC)    COPD, severe (HCC)    Left lower lobe pneumonia    Chronic lymphocytic leukemia in remission ([place])    CLL (chronic lymphocytic leukemia) ([place])    Acute respiratory failure with hypercapnia ([place]) 12/08/2020   Hyperkalemia    Leukocytosis    AKI (acute kidney injury) ([place])    Healthcare maintenance 08/12/2020   Oral thrush 04/30/2017   Dysfunction of right eustachian tube 01/19/2016   Left ear impacted cerumen 01/19/2016   COPD, very severe ([place])  08/22/2015   Chronic respiratory failure with hypercapnia ([place]) 06/08/2014   Unspecified sleep apnea 06/08/2014   Chronic diastolic heart failure ([place]) 05/11/2014   Acute-on-chronic respiratory failure ([place]) 04/24/2014   Sinusitis 04/13/2014   Renal insufficiency 07/07/2012   Hypertension 07/07/2012   COPD exacerbation ([place]) 07/04/2012   COPD, severe ([place]) 04/25/2012   Sleep apnea 04/25/2012   Obesity 04/25/2012   PCP:  [facility], [place]:   [id number] - [place], [place] [address] Phone: [phone] Fax: [phone]  [facility] [place], [place] [address] [place] [address] Phone: [phone] Fax: [place] [address] [place] [address] Phone: [phone] Fax: [phone]     Social Determinants of Health (SDOH) Interventions    Readmission Risk Interventions No flowsheet data found.

## 2021-11-09 NOTE — Progress Notes (Signed)
Attempted to call pt's wife to give update.  No answer at listed numbers.  Chesley Mires, MD [place] Pager - [phone] 11/09/2021, 1:36 PM

## 2021-11-09 NOTE — Procedures (Signed)
Percutaneous Tracheostomy Procedure Note   Isaac [name]  [id number]  [birth year]  Date:11/09/21  Time:10:43 AM   Provider Performing:Joanne Salah  Procedure: Percutaneous Tracheostomy with Bronchoscopic Guidance ([id number])  Indication(s) Acute respiratory failure  Consent Risks of the procedure as well as the alternatives and risks of each were explained to the patient and/or caregiver.  Consent for the procedure was obtained.  Anesthesia Etomidate, Versed, Fentanyl, Vecuronium   Time Out Verified patient identification, verified procedure, site/side was marked, verified correct patient position, special equipment/implants available, medications/allergies/relevant history reviewed, required imaging and test results available.   Sterile Technique Maximal sterile technique including sterile barrier drape, hand hygiene, sterile gown, sterile gloves, mask, hair covering.    Procedure Description Appropriate anatomy identified by palpation.  Patient's neck prepped and draped in sterile fashion.  1% lidocaine with epinephrine was used to anesthetize skin overlying neck.  1.5cm incision made and blunt dissection performed until tracheal rings could be easily palpated.   Then a size 6 Shiley tracheostomy was placed under bronchoscopic visualization using usual Seldinger technique and serial dilation.   Bronchoscope confirmed placement above the carina.  Tracheostomy was sutured in place with adhesive pad to protect skin under pressure.    Patient connected to ventilator.   Complications/Tolerance None; patient tolerated the procedure well. Chest X-ray is ordered to confirm no post-procedural complication.   EBL Minimal   Specimen(s) None

## 2021-11-09 NOTE — Progress Notes (Signed)
NAME:  Isaac [name], MRN:  [id number], DOB:  02-18-1956, LOS: 5 ADMISSION DATE:  11/04/2021, CONSULTATION DATE: 11/04/2021 REFERRING MD: Emergency room, CHIEF COMPLAINT: Acute respiratory failure  History of Present Illness:  65 yo male presented with dyspnea, fever, fatigue.  He required intubation in ER.  Positive for COVID 19.  Hospitalized in January 2022, April 2022 and September 2022 for AECOPD and PNA.  He has history of tracheostomy.  He has been on ibrutinib for CLL.  Followed by Dr. Chase Caller in pulmonary office for severe COPD.  Pertinent  Medical History  COPD, CLL, Anxiety, HTN, GERD  Significant Hospital Events:   12/10 Presents to ED requiring intubation  12/12 Extubated >> required reintubation 12/15 tracheostomy  Studies:  PFT 06/08/14 >> FEV1 0.90 (24%), FEV1% 42, TLC 5.16 (72%), DLCO 26% CT angio chest 11/06/21 >> Rt greater than Lt patchy b/l GGO, emphysema, atherosclerosis  Interim history/Subjective:  Had tracheostomy this morning.  Objective   Blood pressure 105/74, pulse (!) 106, temperature 99.5 F (37.5 C), resp. rate (!) 21, height 5\' 10"  (1.778 m), SpO2 99 %.    Vent Mode: PRVC FiO2 (%):  [40 %-100 %] 100 % Set Rate:  [22 bmp] 22 bmp Vt Set:  [580 mL] 580 mL PEEP:  [5 cmH20] 5 cmH20 Pressure Support:  [10 cmH20] 10 cmH20 Plateau Pressure:  [24 cmH20-26 cmH20] 25 cmH20   Intake/Output Summary (Last 24 hours) at 11/09/2021 1107 Last data filed at 11/09/2021 1000 Gross per 24 hour  Intake 627.54 ml  Output 1155 ml  Net -527.46 ml   There were no vitals filed for this visit.  Examination:  General - sedated Eyes - pupils reactive ENT - trach site clean Cardiac - regular rate/rhythm, no murmur Chest - decreased BS Abdomen - soft, non tender, + bowel sounds Extremities - no cyanosis, clubbing, or edema Skin - no rashes Neuro - RASS -2  Discussion:  He has severe COPD with emphysema and recurrent respiratory infections.  He failed  extubation trial on 11/06/21.  I am concerned given the severity of his COPD he will need more prolonged, intermittent ventilator support.  He opted to proceed with tracheostomy and slower vent weaning process rather than extubation trial again.  As such he had tracheostomy inserted on 11/09/21.  Assessment & Plan:   Acute hypoxic/hypercapnic respiratory failure. Failure to wean from ventilator s/p tracheostomy 11/09/21. - plan to start vent weaning 12/16 - f/u CXR intermittently - trach care - goal SpO2 > 92%   Sepsis from COVID 19 pneumonia with possible bacterial pneumonia. - completed ABx, and remdesivir - day 6 of decadron   Hx of COPD. - continue pulmicort, yupelri, brovana - prn albuterol   Hx of CLL. - diagnosed in 2019 - followed by Dr. Zola Button with oncology - f/u CBC - if stable, then consider resuming imbruvica sometime later this week - continue allopurinol  Anemia of critical illness and chronic disease. - f/u CBC - transfuse for Hb < 7   Sedation while on vent. Anxiety. - RASS goal 0 to -1 - prn versed   Hx of HTN. - continue lopressor - prn hydralazine with goal SBP < 170   Pressure injury. - stage 1 buttock, POA   Best Practice (right click and "Reselect all SmartList Selections" daily)   Diet/type: tube feeds DVT prophylaxis: Lovenox  GI prophylaxis: protonix Lines: N/A Foley:  Yes, and it is still needed Code Status:  full code  Labs  CMP Latest Ref Rng & Units 11/08/2021 11/07/2021 11/06/2021  Glucose 70 - 99 mg/dL 137(H) 118(H) -  BUN 8 - 23 mg/dL 34(H) 36(H) -  Creatinine 0.61 - 1.24 mg/dL 0.80 1.04 -  Sodium 135 - 145 mmol/L 139 141 140  Potassium 3.5 - 5.1 mmol/L 3.8 3.9 3.8  Chloride 98 - 111 mmol/L 99 100 -  CO2 22 - 32 mmol/L 33(H) 31 -  Calcium 8.9 - 10.3 mg/dL 7.9(L) 8.2(L) -  Total Protein 6.5 - 8.1 g/dL - - -  Total Bilirubin 0.3 - 1.2 mg/dL - - -  Alkaline Phos 38 - 126 U/L - - -  AST 15 - 41 U/L - - -  ALT 0 - 44  U/L - - -    CBC Latest Ref Rng & Units 11/08/2021 11/07/2021 11/06/2021  WBC 4.0 - 10.5 K/uL 58.5(HH) 43.2(H) -  Hemoglobin 13.0 - 17.0 g/dL 10.4(L) 10.0(L) 11.6(L)  Hematocrit 39.0 - 52.0 % 36.0(L) 34.2(L) 34.0(L)  Platelets 150 - 400 K/uL 187 164 -    ABG    Component Value Date/Time   PHART 7.385 11/06/2021 1539   PCO2ART 64.4 (H) 11/06/2021 1539   PO2ART 138 (H) 11/06/2021 1539   HCO3 38.4 (H) 11/06/2021 1539   TCO2 40 (H) 11/06/2021 1539   ACIDBASEDEF 28.0 (H) 03/06/2021 0810   O2SAT 99.0 11/06/2021 1539    CBG (last 3)  Recent Labs    11/08/21 2354 11/09/21 0351 11/09/21 0702  GLUCAP 124* 99 94   Critical care time: 32 minutes independent of procedure time  Chesley Mires, MD [place] Pager - [phone] 11/09/2021, 11:07 AM

## 2021-11-09 NOTE — Progress Notes (Signed)
°   11/09/21 1104  Tracheostomy Shiley Flexible 6 mm Cuffed  Placement Date/Time: 11/09/21 1103   Placed By: ICU physician  Brand: Shiley Flexible  Size (mm): 6 mm  Style: Cuffed  Status Secured  Site Assessment Bleeding  Site Care Dressing applied  Ties Assessment Secure  Cuff Pressure (cm H2O) Clear OR 27-39 CmH2O  Tracheostomy Equipment at bedside Yes and checklist posted at head of bed  Adult Ventilator Settings  Vent Type Servo i  Humidity HME  Vent Mode PRVC  Vt Set 580 mL  Set Rate 22 bmp  FiO2 (%) 100 %  I Time 0.8 Sec(s)  PEEP 5 cmH20  Adult Ventilator Measurements  Peak Airway Pressure 30 L/min  Mean Airway Pressure 13 cmH20  Plateau Pressure 25 cmH20  Resp Rate Spontaneous 0 br/min  Resp Rate Total 22 br/min  Exhaled Vt 589 mL  Measured Ve 12.8 mL  I:E Ratio Measured 1:1.5  Auto PEEP 0 cmH20  Total PEEP 5 cmH20  SpO2 99 %  Adult Ventilator Alarms  Alarms On Y  Ve High Alarm 16 L/min  Ve Low Alarm 4 L/min  Resp Rate High Alarm 36 br/min  Resp Rate Low Alarm 8  PEEP Low Alarm 3 cmH2O  Press High Alarm 50 cmH2O  Daily Weaning Assessment  Daily Assessment of Readiness to Wean Wean protocol criteria not met  Reason not met  (Trach started today)  Breath Sounds  Bilateral Breath Sounds Diminished  Airway Suctioning/Secretions  Suction Type ETT  Suction Device  Catheter  Secretion Amount None  Suction Tolerance Tolerated well  Suctioning Adverse Effects None

## 2021-11-10 ENCOUNTER — Inpatient Hospital Stay (HOSPITAL_COMMUNITY): Payer: BC Managed Care – PPO

## 2021-11-10 DIAGNOSIS — U071 COVID-19: Secondary | ICD-10-CM

## 2021-11-10 LAB — COMPREHENSIVE METABOLIC PANEL
ALT: 16 U/L (ref 0–44)
AST: 20 U/L (ref 15–41)
Albumin: 3 g/dL — ABNORMAL LOW (ref 3.5–5.0)
Alkaline Phosphatase: 56 U/L (ref 38–126)
Anion gap: 6 (ref 5–15)
BUN: 29 mg/dL — ABNORMAL HIGH (ref 8–23)
CO2: 33 mmol/L — ABNORMAL HIGH (ref 22–32)
Calcium: 8.4 mg/dL — ABNORMAL LOW (ref 8.9–10.3)
Chloride: 105 mmol/L (ref 98–111)
Creatinine, Ser: 0.84 mg/dL (ref 0.61–1.24)
GFR, Estimated: >60 mL/min (ref >60)
Glucose, Bld: 93 mg/dL (ref 70–99)
Potassium: 4.4 mmol/L (ref 3.5–5.1)
Sodium: 144 mmol/L (ref 135–145)
Total Bilirubin: 1 mg/dL (ref 0.3–1.2)
Total Protein: 5 g/dL — ABNORMAL LOW (ref 6.5–8.1)

## 2021-11-10 LAB — GLUCOSE, CAPILLARY
Glucose-Capillary: 107 mg/dL — ABNORMAL HIGH (ref 70–99)
Glucose-Capillary: 115 mg/dL — ABNORMAL HIGH (ref 70–99)
Glucose-Capillary: 83 mg/dL (ref 70–99)
Glucose-Capillary: 84 mg/dL (ref 70–99)
Glucose-Capillary: 92 mg/dL (ref 70–99)
Glucose-Capillary: 97 mg/dL (ref 70–99)

## 2021-11-10 LAB — MAGNESIUM: Magnesium: 2.5 mg/dL — ABNORMAL HIGH (ref 1.7–2.4)

## 2021-11-10 MED ORDER — BIOTENE DRY MOUTH MT LIQD: 15.0000 mL | OROMUCOSAL | Status: DC | PRN

## 2021-11-10 MED ORDER — OXYCODONE HCL 5 MG PO TABS
5.0000 mg | ORAL_TABLET | Freq: Four times a day (QID) | ORAL | Status: DC
  Administered 2021-11-10 – 2021-11-11 (×4): 5 mg via ORAL
  Filled 2021-11-10 (×4): qty 1

## 2021-11-10 MED ORDER — LORAZEPAM 0.5 MG PO TABS
0.5000 mg | ORAL_TABLET | Freq: Two times a day (BID) | ORAL | Status: DC | PRN
  Administered 2021-11-10 – 2021-11-11 (×2): 0.5 mg
  Filled 2021-11-10 (×2): qty 1

## 2021-11-10 MED ORDER — DIPHENHYDRAMINE HCL 50 MG/ML IJ SOLN
25.0000 mg | Freq: Once | INTRAMUSCULAR | Status: AC
  Administered 2021-11-10: 25 mg via INTRAVENOUS
  Filled 2021-11-10: qty 1

## 2021-11-10 NOTE — Evaluation (Signed)
Physical Therapy Evaluation Patient Details Name: Isaac [name] MRN: [id number] DOB: 07-03-1956 Today's Date: 11/10/2021  History of Present Illness  65 yo admitted 12/10 with dyspnea requiring intubation in ED and trach 12/15. Pt Covid (+) on admission. PMhx: admissions Jan, April, Sept 2022 for AECOPD and PNA, CLL, anxiety, obesity, OSA, HTN  Clinical Impression  Pt pleasant able to communicate talking over vent, nodding head and writing. Pt normally walks with RW with assist of wife for bathing/dressing. Pt anxious and self-limiting due to anxiety with mobility. Pt benefits from increased time, reassurance and assist to manage lines and for physical assist. Pt with decreased strength, transfers, function, mobility and safety who will benefit from acute therapy to maximize mobility and function. Encouraged OOB to chair and EOB with nursing staff.   FiO2 40%, CPAP HR 125 SpO2 97%       Recommendations for follow up therapy are one component of a multi-disciplinary discharge planning process, led by the attending physician.  Recommendations may be updated based on patient status, additional functional criteria and insurance authorization.  Follow Up Recommendations Skilled nursing-short term rehab (<3 hours/day)    Assistance Recommended at Discharge Frequent or constant Supervision/Assistance  Functional Status Assessment Patient has had a recent decline in their functional status and demonstrates the ability to make significant improvements in function in a reasonable and predictable amount of time.  Equipment Recommendations  Rollator (4 wheels)    Recommendations for Other Services       Precautions / Restrictions Precautions Precautions: Fall;Other (comment) Precaution Comments: trach, vent, watch HR, anxiety      Mobility  Bed Mobility Overal bed mobility: Needs Assistance Bed Mobility: Supine to Sit     Supine to sit: Min assist;+2 for safety/equipment;HOB elevated      General bed mobility comments: min assist with HOB 25 degrees, increased time and cues with +2 for lines and safety. cues to fully scoot to EOB    Transfers Overall transfer level: Needs assistance   Transfers: Sit to/from Stand Sit to Stand: Mod assist;+2 physical assistance           General transfer comment: mod +2 physical assist with bil UE hooking and knees blocked to rise from surface x 3 trials. Pt would not achieve full standing despite cues, assist and reassurance. Pt with partial side step without significant foot movement for sequential slide to [facility] [place] and return to bed as pt denied OOB to chair    Ambulation/Gait               General Gait Details: unable this session  Stairs            Wheelchair Mobility    Modified Rankin (Stroke Patients Only)       Balance Overall balance assessment: Needs assistance Sitting-balance support: Bilateral upper extremity supported Sitting balance-Leahy Scale: Fair Sitting balance - Comments: guarding for lines and safety   Standing balance support: Bilateral upper extremity supported Standing balance-Leahy Scale: Poor Standing balance comment: bil UE support and knees blocked                             Pertinent Vitals/Pain Pain Assessment: No/denies pain    Home Living Family/patient expects to be discharged to:: Private residence Living Arrangements: Spouse/significant other Available Help at Discharge: Family;Available 24 hours/day Type of Home: House Home Access: Ramped entrance       Home Layout: One level Home Equipment: Rolling  Walker (2 wheels);BSC/3in1;Transport chair;Wheelchair - manual      Prior Function Prior Level of Function : Needs assist       Physical Assist : ADLs (physical) Mobility (physical): Gait ADLs (physical): Bathing;Dressing Mobility Comments: uses RW for gait, wife assist with oxygen ADLs Comments: sponge bathes with wife assist     Hand Dominance         Extremity/Trunk Assessment   Upper Extremity Assessment Upper Extremity Assessment: Defer to OT evaluation    Lower Extremity Assessment Lower Extremity Assessment: Generalized weakness    Cervical / Trunk Assessment Cervical / Trunk Assessment: Kyphotic  Communication   Communication: Tracheostomy (trach on vent, writing words and talking over vent at times)  Cognition Arousal/Alertness: Awake/alert Behavior During Therapy: Flat affect Overall Cognitive Status: Impaired/Different from baseline Area of Impairment: Safety/judgement                         Safety/Judgement: Decreased awareness of deficits     General Comments: pt self-limiting and frequently stating "I need to rest".        General Comments      Exercises     Assessment/Plan    PT Assessment Patient needs continued PT services  PT Problem List Decreased strength;Decreased mobility;Decreased safety awareness;Decreased activity tolerance;Decreased balance;Decreased knowledge of use of DME;Cardiopulmonary status limiting activity;Decreased cognition;Decreased skin integrity       PT Treatment Interventions Gait training;Balance training;Functional mobility training;Therapeutic activities;Patient/family education;Cognitive remediation;Neuromuscular re-education;DME instruction;Therapeutic exercise    PT Goals (Current goals can be found in the Care Plan section)  Acute Rehab PT Goals Patient Stated Goal: return home PT Goal Formulation: With patient Time For Goal Achievement: 11/24/21 Potential to Achieve Goals: Fair    Frequency Min 3X/week   Barriers to discharge Decreased caregiver support      Co-evaluation PT/OT/SLP Co-Evaluation/Treatment: Yes Reason for Co-Treatment: Complexity of the patient's impairments (multi-system involvement);For patient/therapist safety PT goals addressed during session: Mobility/safety with mobility         AM-PAC PT "6 Clicks" Mobility   Outcome Measure Help needed turning from your back to your side while in a flat bed without using bedrails?: A Little Help needed moving from lying on your back to sitting on the side of a flat bed without using bedrails?: A Little Help needed moving to and from a bed to a chair (including a wheelchair)?: A Lot Help needed standing up from a chair using your arms (e.g., wheelchair or bedside chair)?: Total Help needed to walk in hospital room?: Total Help needed climbing 3-5 steps with a railing? : Total 6 Click Score: 11    End of Session   Activity Tolerance: Patient limited by fatigue Patient left: in bed;with call bell/phone within reach;with bed alarm set Nurse Communication: Mobility status PT Visit Diagnosis: Other abnormalities of gait and mobility (R26.89);Muscle weakness (generalized) (M62.81);Difficulty in walking, not elsewhere classified (R26.2)    Time: 6063-0160 PT Time Calculation (min) (ACUTE ONLY): 35 min   Charges:   PT Evaluation $PT Eval High Complexity: 1 High          Courage Biglow P, PT Acute Rehabilitation Services Pager: [phone] Office: [phone]   Sandy Salaam Kolleen [facility] 11/10/2021, 8:41 AM

## 2021-11-10 NOTE — Progress Notes (Signed)
Patient refused Tube feed

## 2021-11-10 NOTE — Progress Notes (Addendum)
NAME:  Isaac [name], MRN:  [id number], DOB:  [birth year], LOS: 6 ADMISSION DATE:  11/04/2021, CONSULTATION DATE: 11/04/2021 REFERRING MD: Emergency room, CHIEF COMPLAINT: Acute respiratory failure  History of Present Illness:  65 yo male presented with dyspnea, fever, fatigue.  He required intubation in ER.  Positive for COVID 19.  Hospitalized in January 2022, April 2022 and September 2022 for AECOPD and PNA.  He has history of tracheostomy.  He has been on ibrutinib for CLL.  Followed by Dr. Chase Caller in pulmonary office for severe COPD.  Pertinent  Medical History  COPD, CLL, Anxiety, HTN, GERD  Significant Hospital Events:   12/10 Presents to ED requiring intubation  12/12 Extubated >> required reintubation 12/15 tracheostomy  Studies:  PFT 06/08/14 >> FEV1 0.90 (24%), FEV1% 42, TLC 5.16 (72%), DLCO 26% CT angio chest 11/06/21 >> Rt greater than Lt patchy b/l GGO, emphysema, atherosclerosis  Interim history/Subjective:  Doing well post trach. Sutures are intact, no bleeding around site. Talking and writing notes. Weaning on 40/5 with good volumes and saturations   Objective   Blood pressure 139/80, pulse (!) 125, temperature 98.5 F (36.9 C), temperature source Oral, resp. rate (!) 22, height 5\' 10"  (1.778 m), SpO2 96 %.    Vent Mode: PRVC FiO2 (%):  [40 %-100 %] 40 % Set Rate:  [22 bmp] 22 bmp Vt Set:  [580 mL] 580 mL PEEP:  [5 cmH20] 5 cmH20 Plateau Pressure:  [22 cmH20-25 cmH20] 22 cmH20   Intake/Output Summary (Last 24 hours) at 11/10/2021 0758 Last data filed at 11/10/2021 0610 Gross per 24 hour  Intake 250.5 ml  Output 930 ml  Net -679.5 ml   There were no vitals filed for this visit.  Examination:  General - Awake and alert, writing notes despite sedation, Trach intact, NAD Eyes - pupils reactive ENT - trach site clean, sutures secure Cardiac - regular rate/rhythm, no murmur, R or G Chest - Bilateral chest excursion, decreased BS, prolonged exp  phase Abdomen - soft, non tender,ND,  + bowel sounds, Body mass index is 29.73 kg/m.  Extremities - no cyanosis, clubbing, or edema, no obvious deformities Skin - no rashes, no lesions , warm dry and intact, new trach with sutures Neuro - RASS 0  Discussion:  He has severe COPD with emphysema and recurrent respiratory infections.  He failed extubation trial on 11/06/21.  I am concerned given the severity of his COPD he will need more prolonged, intermittent ventilator support.  He opted to proceed with tracheostomy and slower vent weaning process rather than extubation trial again.  As such he had tracheostomy inserted on 11/09/21. This is his second trach. He is doing well , alert and writing notes despite 200 mcg fentanyl per drip .  T Max 99.7 + 5600 cc's , Does take lasix at home  Resp Cx 12/12>> Rare candida >> consider contaminant>>  consider tx if spikes fevers 12/10 BC No Growth Assessment & Plan:   Acute hypoxic/hypercapnic respiratory failure. Failure to wean from ventilator s/p tracheostomy 11/09/21. - Daily  vent weaning starting  12/16 - f/u CXR intermittently - trach care - goal SpO2 > 92% - Will get CXR , and add diuretic if needed to optimize for weaning success - Consider ATC 12/16 pm as looks good weaning   Sepsis from [place] 19 pneumonia with possible bacterial pneumonia. - completed ABx, and remdesivir - day 7 of 8  of decadron   Hx of COPD. - continue pulmicort, yupelri, brovana -  prn albuterol - Aggressive Pulmonary Toilet - OOB to chair and mobilize - PT/OT - Sat Goals 88-92%   Hx of CLL. - diagnosed in 2019 - followed by Dr. Zola Button with oncology - f/u CBC - if stable, then consider resuming imbruvica sometime later this week - continue allopurinol  Anemia of critical illness and chronic disease. - f/u CBC - transfuse for Hb < 7   Sedation while on vent. Anxiety. - RASS goal 0 to -1 - prn versed - Wean fentanyl as able   Hx of HTN. -  continue lopressor - prn hydralazine with goal SBP < 170   Pressure injury. - stage 1 buttock, POA   Best Practice (right click and "Reselect all SmartList Selections" daily)   Diet/type: tube feeds DVT prophylaxis: Lovenox  GI prophylaxis: protonix Lines: N/A Foley:  Yes, and it is still needed Code Status:  full code  Labs    CMP Latest Ref Rng & Units 11/08/2021 11/07/2021 11/06/2021  Glucose 70 - 99 mg/dL 137(H) 118(H) -  BUN 8 - 23 mg/dL 34(H) 36(H) -  Creatinine 0.61 - 1.24 mg/dL 0.80 1.04 -  Sodium 135 - 145 mmol/L 139 141 140  Potassium 3.5 - 5.1 mmol/L 3.8 3.9 3.8  Chloride 98 - 111 mmol/L 99 100 -  CO2 22 - 32 mmol/L 33(H) 31 -  Calcium 8.9 - 10.3 mg/dL 7.9(L) 8.2(L) -  Total Protein 6.5 - 8.1 g/dL - - -  Total Bilirubin 0.3 - 1.2 mg/dL - - -  Alkaline Phos 38 - 126 U/L - - -  AST 15 - 41 U/L - - -  ALT 0 - 44 U/L - - -    CBC Latest Ref Rng & Units 11/08/2021 11/07/2021 11/06/2021  WBC 4.0 - 10.5 K/uL 58.5(HH) 43.2(H) -  Hemoglobin 13.0 - 17.0 g/dL 10.4(L) 10.0(L) 11.6(L)  Hematocrit 39.0 - 52.0 % 36.0(L) 34.2(L) 34.0(L)  Platelets 150 - 400 K/uL 187 164 -    ABG    Component Value Date/Time   PHART 7.385 11/06/2021 1539   PCO2ART 64.4 (H) 11/06/2021 1539   PO2ART 138 (H) 11/06/2021 1539   HCO3 38.4 (H) 11/06/2021 1539   TCO2 40 (H) 11/06/2021 1539   ACIDBASEDEF 28.0 (H) 03/06/2021 0810   O2SAT 99.0 11/06/2021 1539    CBG (last 3)  Recent Labs    11/09/21 2309 11/10/21 0349 11/10/21 0700  GLUCAP 88 84 83   Critical care time: 35 minutes independent of procedure time  Magdalen Spatz, MSN, AGACNP-BC [place] for personal pager PCCM on call pager [phone]  11/10/2021, 7:58 AM

## 2021-11-10 NOTE — Evaluation (Signed)
Occupational Therapy Evaluation Patient Details Name: Isaac [name] MRN: [id number] DOB: August 04, 1956 Today's Date: 11/10/2021   History of Present Illness 65 yo admitted 12/10 with dyspnea requiring intubation in ED and trach 12/15. Pt Covid (+) on admission. PMhx: admissions Jan, April, Sept 2022 for AECOPD and PNA, CLL, anxiety, obesity, OSA, HTN   Clinical Impression   This 65 yo male admitted with above presents to acute OT with PLOF of needing A for bathing/dressing and some mobility(lines and tubes), but could toilet himself. He currently is setup-total A for basic ADLs and will benefit from acute OT with follow up at SNF.      Recommendations for follow up therapy are one component of a multi-disciplinary discharge planning process, led by the attending physician.  Recommendations may be updated based on patient status, additional functional criteria and insurance authorization.   Follow Up Recommendations  Skilled nursing-short term rehab (<3 hours/day)    Assistance Recommended at Discharge Frequent or constant Supervision/Assistance  Functional Status Assessment  Patient has had a recent decline in their functional status and demonstrates the ability to make significant improvements in function in a reasonable and predictable amount of time.  Equipment Recommendations  None recommended by OT       Precautions / Restrictions Precautions Precautions: Fall Precaution Comments: trach, vent, watch HR, anxiety Restrictions Weight Bearing Restrictions: No      Mobility Bed Mobility Overal bed mobility: Needs Assistance Bed Mobility: Supine to Sit     Supine to sit: Min assist;+2 for safety/equipment;HOB elevated     General bed mobility comments: min assist with HOB 25 degrees, increased time and cues with +2 for lines and safety. cues to fully scoot to EOB    Transfers Overall transfer level: Needs assistance   Transfers: Sit to/from Stand Sit to Stand: Mod  assist;+2 physical assistance           General transfer comment: mod +2 physical assist with bil UE hooking and knees blocked to rise from surface x 3 trials. Pt would not achieve full standing despite cues, assist and reassurance. Pt with partial side step without significant foot movement for sequential slide to [facility] and return to bed as pt denied OOB to chair      Balance Overall balance assessment: Needs assistance Sitting-balance support: Bilateral upper extremity supported Sitting balance-Leahy Scale: Fair Sitting balance - Comments: guarding for lines and safety   Standing balance support: Bilateral upper extremity supported Standing balance-Leahy Scale: Poor Standing balance comment: bil UE support and knees blocked                           ADL either performed or assessed with clinical judgement   ADL Overall ADL's : Needs assistance/impaired Eating/Feeding: NPO;Bed level Eating/Feeding Details (indicate cue type and reason): but would not have any issues with self feeding if he were allowed to Grooming: Sitting;Min guard Grooming Details (indicate cue type and reason): EOB Upper Body Bathing: Moderate assistance;Sitting Upper Body Bathing Details (indicate cue type and reason): EOB Lower Body Bathing: Total assistance Lower Body Bathing Details (indicate cue type and reason): Mod A +2 sit<>stand Upper Body Dressing : Maximal assistance;Sitting Upper Body Dressing Details (indicate cue type and reason): EOB Lower Body Dressing: Total assistance Lower Body Dressing Details (indicate cue type and reason): Mod A +2 sit<>stand Toilet Transfer: Moderate assistance;+2 for physical assistance Toilet Transfer Details (indicate cue type and reason): side step up towards [facility] with  Bil HHA Toileting- Clothing Manipulation and Hygiene: Total assistance Toileting - Clothing Manipulation Details (indicate cue type and reason): Mod A +2 sit<>stand             Vision  Patient Visual Report: No change from baseline              Pertinent Vitals/Pain Pain Assessment: No/denies pain     Hand Dominance Right   Extremity/Trunk Assessment Upper Extremity Assessment Upper Extremity Assessment: Generalized weakness   Lower Extremity Assessment Lower Extremity Assessment: Generalized weakness   Cervical / Trunk Assessment Cervical / Trunk Assessment: Kyphotic   Communication Communication Communication: Tracheostomy (trach on vent, writing words and talking over vent at times)   Cognition Arousal/Alertness: Awake/alert Behavior During Therapy: Flat affect Overall Cognitive Status: Impaired/Different from baseline Area of Impairment: Safety/judgement                         Safety/Judgement: Decreased awareness of deficits     General Comments: pt self-limiting and frequently stating "I need to rest".                Home Living Family/patient expects to be discharged to:: Private residence Living Arrangements: Spouse/significant other Available Help at Discharge: Family;Available 24 hours/day Type of Home: House Home Access: Ramped entrance     Home Layout: One level     Bathroom Shower/Tub: Walk-in shower;Sponge bathes at baseline   Bathroom Toilet: Handicapped height     Home Equipment: Conservation officer, nature (2 wheels);BSC/3in1;Transport chair;Wheelchair - manual          Prior Functioning/Environment Prior Level of Function : Needs assist       Physical Assist : ADLs (physical) Mobility (physical): Gait ADLs (physical): Bathing;Dressing Mobility Comments: uses RW for gait, wife assist with oxygen ADLs Comments: sponge bathes with wife assist and wife A with dressing        OT Problem List: Decreased strength;Decreased activity tolerance;Impaired balance (sitting and/or standing);Cardiopulmonary status limiting activity      OT Treatment/Interventions: Self-care/ADL training;DME and/or AE  instruction;Patient/family education;Balance training    OT Goals(Current goals can be found in the care plan section) Acute Rehab OT Goals Patient Stated Goal: to rest OT Goal Formulation: With patient Time For Goal Achievement: 11/24/21 Potential to Achieve Goals: Good  OT Frequency: Min 2X/week           Co-evaluation PT/OT/SLP Co-Evaluation/Treatment: Yes Reason for Co-Treatment: Complexity of the patient's impairments (multi-system involvement);For patient/therapist safety PT goals addressed during session: Mobility/safety with mobility OT goals addressed during session: Strengthening/ROM      AM-PAC OT "6 Clicks" Daily Activity     Outcome Measure Help from another person eating meals?: Total Help from another person taking care of personal grooming?: A Little Help from another person toileting, which includes using toliet, bedpan, or urinal?: A Lot Help from another person bathing (including washing, rinsing, drying)?: A Lot Help from another person to put on and taking off regular upper body clothing?: A Lot Help from another person to put on and taking off regular lower body clothing?: Total 6 Click Score: 11   End of Session Equipment Utilized During Treatment:  (vent)  Activity Tolerance:  (limited by anxiety) Patient left: in bed;with bed alarm set  OT Visit Diagnosis: Unsteadiness on feet (R26.81);Muscle weakness (generalized) (M62.81)                Time: 5852-7782 OT Time Calculation (min): 37 min Charges:  OT General  Charges $OT Visit: 1 Visit OT Evaluation $OT Eval Moderate Complexity: 1 Mod  [place], OTR/L Acute [employer] Pager [phone] Office [phone]    Almon Register 11/10/2021, 10:58 AM

## 2021-11-10 NOTE — Procedures (Signed)
Cortrak  Person Inserting Tube:  Cailey Trigueros, Creola Corn, RD Tube Type:  Cortrak - 43 inches Tube Size:  10 Tube Location:  Right nare Initial Placement:  Stomach Secured by: Bridle Technique Used to Measure Tube Placement:  Marking at nare/corner of mouth Cortrak Secured At:  70 cm  Cortrak Tube Team Note:  Consult received to place a Cortrak feeding tube.   X-ray is required, abdominal x-ray has been ordered by the Cortrak team. Please confirm tube placement before using the Cortrak tube.   If the tube becomes dislodged please keep the tube and contact the Cortrak team at [email or web address] (password [email or web address]) for replacement.  If after hours and replacement cannot be delayed, place a NG tube and confirm placement with an abdominal x-ray.     Theone Stanley., MS, RD, LDN (she/her/hers) RD pager number and weekend/on-call pager number located in [place].

## 2021-11-10 NOTE — Progress Notes (Signed)
SLP Cancellation Note  Patient Details Name: Isaac [name] MRN: [id number] DOB: 04-21-1956   Cancelled treatment:       Reason Eval/Treat Not Completed: Patient not medically ready (Pt's case discussed with Bailey Mech, RN who indicated that it is unlikely that pt will be transitioned to ATC today. SLP will follow up next week, unless contacted sooner.)  Herminio Kniskern I. Hardin Negus, [place], [place] Office number [phone] Pager [place] 11/10/2021, 10:47 AM

## 2021-11-11 ENCOUNTER — Inpatient Hospital Stay (HOSPITAL_COMMUNITY): Payer: BC Managed Care – PPO

## 2021-11-11 LAB — CBC WITH DIFFERENTIAL/PLATELET
Abs Immature Granulocytes: 0.33 10*3/uL — ABNORMAL HIGH (ref 0.00–0.07)
Abs Immature Granulocytes: 0.48 10*3/uL — ABNORMAL HIGH (ref 0.00–0.07)
Basophils Absolute: 0.1 10*3/uL (ref 0.0–0.1)
Basophils Absolute: 0 10*3/uL (ref 0.0–0.1)
Basophils Relative: 0 %
Basophils Relative: 0 %
Eosinophils Absolute: 0 10*3/uL (ref 0.0–0.5)
Eosinophils Absolute: 0 10*3/uL (ref 0.0–0.5)
Eosinophils Relative: 0 %
Eosinophils Relative: 0 %
HCT: 36.5 % — ABNORMAL LOW (ref 39.0–52.0)
HCT: 36.1 % — ABNORMAL LOW (ref 39.0–52.0)
Hemoglobin: 10.5 g/dL — ABNORMAL LOW (ref 13.0–17.0)
Hemoglobin: 10.2 g/dL — ABNORMAL LOW (ref 13.0–17.0)
Immature Granulocytes: 1 %
Immature Granulocytes: 1 %
Lymphocytes Relative: 83 %
Lymphocytes Relative: 81 %
Lymphs Abs: 57 10*3/uL — ABNORMAL HIGH (ref 0.7–4.0)
Lymphs Abs: 58.3 10*3/uL — ABNORMAL HIGH (ref 0.7–4.0)
MCH: 26.1 pg (ref 26.0–34.0)
MCH: 25.4 pg — ABNORMAL LOW (ref 26.0–34.0)
MCHC: 28.8 g/dL — ABNORMAL LOW (ref 30.0–36.0)
MCHC: 28.3 g/dL — ABNORMAL LOW (ref 30.0–36.0)
MCV: 90.6 fL (ref 80.0–100.0)
MCV: 90 fL (ref 80.0–100.0)
Monocytes Absolute: 2.6 10*3/uL — ABNORMAL HIGH (ref 0.1–1.0)
Monocytes Absolute: 3.7 10*3/uL — ABNORMAL HIGH (ref 0.1–1.0)
Monocytes Relative: 4 %
Monocytes Relative: 5 %
Neutro Abs: 8.5 10*3/uL — ABNORMAL HIGH (ref 1.7–7.7)
Neutro Abs: 9 10*3/uL — ABNORMAL HIGH (ref 1.7–7.7)
Neutrophils Relative %: 12 %
Neutrophils Relative %: 13 %
Platelets: 246 10*3/uL (ref 150–400)
Platelets: 258 10*3/uL (ref 150–400)
RBC: 4.03 MIL/uL — ABNORMAL LOW (ref 4.22–5.81)
RBC: 4.01 MIL/uL — ABNORMAL LOW (ref 4.22–5.81)
RDW: 14.6 % (ref 11.5–15.5)
RDW: 14.8 % (ref 11.5–15.5)
WBC: 68.5 10*3/uL (ref 4.0–10.5)
WBC: 71.5 10*3/uL (ref 4.0–10.5)
nRBC: 0 % (ref 0.0–0.2)
nRBC: 0 % (ref 0.0–0.2)

## 2021-11-11 LAB — BASIC METABOLIC PANEL
Anion gap: 11 (ref 5–15)
BUN: 28 mg/dL — ABNORMAL HIGH (ref 8–23)
CO2: 30 mmol/L (ref 22–32)
Calcium: 8.7 mg/dL — ABNORMAL LOW (ref 8.9–10.3)
Chloride: 102 mmol/L (ref 98–111)
Creatinine, Ser: 0.92 mg/dL (ref 0.61–1.24)
GFR, Estimated: >60 mL/min (ref >60)
Glucose, Bld: 81 mg/dL (ref 70–99)
Potassium: 4.7 mmol/L (ref 3.5–5.1)
Sodium: 143 mmol/L (ref 135–145)

## 2021-11-11 LAB — MAGNESIUM: Magnesium: 2.4 mg/dL (ref 1.7–2.4)

## 2021-11-11 LAB — GLUCOSE, CAPILLARY
Glucose-Capillary: 103 mg/dL — ABNORMAL HIGH (ref 70–99)
Glucose-Capillary: 112 mg/dL — ABNORMAL HIGH (ref 70–99)
Glucose-Capillary: 137 mg/dL — ABNORMAL HIGH (ref 70–99)
Glucose-Capillary: 146 mg/dL — ABNORMAL HIGH (ref 70–99)
Glucose-Capillary: 81 mg/dL (ref 70–99)
Glucose-Capillary: 85 mg/dL (ref 70–99)

## 2021-11-11 MED ORDER — IBRUTINIB 420 MG PO TABS
420.0000 mg | ORAL_TABLET | Freq: Every day | ORAL | Status: DC
Start: 2021-11-11 — End: 2021-11-14

## 2021-11-11 MED ORDER — LIDOCAINE HCL (PF) 1 % IJ SOLN
INTRAMUSCULAR | Status: AC
  Administered 2021-11-11: 5 mL via ENDOTRACHEOPULMONARY
  Filled 2021-11-11: qty 5

## 2021-11-11 MED ORDER — OXYCODONE HCL 5 MG PO TABS
5.0000 mg | ORAL_TABLET | Freq: Four times a day (QID) | ORAL | Status: DC
  Administered 2021-11-11 – 2021-11-12 (×3): 5 mg
  Filled 2021-11-11 (×5): qty 1

## 2021-11-11 MED ORDER — FENTANYL CITRATE (PF) 100 MCG/2ML IJ SOLN
100.0000 ug | Freq: Once | INTRAMUSCULAR | Status: AC
  Administered 2021-11-11: 100 ug via INTRAVENOUS

## 2021-11-11 MED ORDER — FENTANYL CITRATE (PF) 100 MCG/2ML IJ SOLN
25.0000 ug | INTRAMUSCULAR | Status: DC | PRN
Start: 2021-11-11 — End: 2021-11-14
  Administered 2021-11-11 – 2021-11-13 (×5): 100 ug via INTRAVENOUS
  Filled 2021-11-11 (×5): qty 2

## 2021-11-11 MED ORDER — MIDAZOLAM HCL 2 MG/2ML IJ SOLN: 4.0000 mg | INTRAMUSCULAR | Status: DC | PRN

## 2021-11-11 MED ORDER — OSMOLITE 1.2 CAL PO LIQD
1000.0000 mL | ORAL | Status: DC
  Administered 2021-11-11 – 2021-11-15 (×5): 1000 mL
  Filled 2021-11-11 (×8): qty 1000

## 2021-11-11 MED ORDER — MIDAZOLAM HCL 2 MG/2ML IJ SOLN
4.0000 mg | Freq: Once | INTRAMUSCULAR | Status: AC
  Administered 2021-11-11: 4 mg via INTRAVENOUS

## 2021-11-11 MED ORDER — FENTANYL CITRATE (PF) 100 MCG/2ML IJ SOLN
INTRAMUSCULAR | Status: AC
  Filled 2021-11-11: qty 4

## 2021-11-11 MED ORDER — FUROSEMIDE 10 MG/ML IJ SOLN
40.0000 mg | Freq: Once | INTRAMUSCULAR | Status: AC
  Administered 2021-11-11: 40 mg via INTRAVENOUS
  Filled 2021-11-11: qty 4

## 2021-11-11 MED ORDER — MIDAZOLAM HCL 2 MG/2ML IJ SOLN
2.0000 mg | Freq: Once | INTRAMUSCULAR | Status: AC
  Administered 2021-11-11: 2 mg via INTRAVENOUS
  Filled 2021-11-11: qty 2

## 2021-11-11 MED ORDER — LORAZEPAM 1 MG PO TABS
1.0000 mg | ORAL_TABLET | Freq: Two times a day (BID) | ORAL | Status: DC | PRN
  Administered 2021-11-11: 1 mg
  Filled 2021-11-11: qty 1

## 2021-11-11 MED ORDER — MIDAZOLAM HCL 2 MG/2ML IJ SOLN
INTRAMUSCULAR | Status: AC
  Filled 2021-11-11: qty 4

## 2021-11-11 NOTE — Progress Notes (Signed)
Patient had another episode of increased WOB and not receiving tidal volumes from ventilator. RT suctioned and placed nebulizer meds in line. Pt started to improve within a few minutes with better volumes and decreased WOB. Pt VS were stable with diminished bilateral breath sounds.

## 2021-11-11 NOTE — Progress Notes (Signed)
Patient with increased WOB. Suctioned patient and placed patient back on full support. Patient then unable to get volumes on ventilator. Bagged patient to confirm trach placement with EZcap with positive color change. Patient extremely hard to bag. Patient talking around trach and exhaling really long making it hard to bag. Once able to give a few breaths placed patient back on ventilator to suction and suctioned out a moderate amount of tan/pink-tinged thick secretions. Volumes returned on ventilator post suctioning. Vitals stable. Will continue to monitor.

## 2021-11-11 NOTE — Procedures (Signed)
Bedside Bronchoscopy Procedure Note Isaac [name] [id number] [birth year]  Procedure: Bronchoscopy Indications: Diagnostic evaluation of the airways  Procedure Details: ET Tube Size: ET Tube secured at lip (cm): Bite block in place: No In preparation for procedure, Patient hyper-oxygenated with 100 % FiO2 Airway entered and the following bronchi were examined: RUL, RML, RLL, LUL, and LLL.   Bronchoscope removed.  , Patient placed back on 100% FiO2 at conclusion of procedure.    Evaluation BP (!) 70/50    Pulse 96    Temp 98.9 F (37.2 C) (Oral)    Resp 18    Ht 5\' 10"  (1.778 m)    SpO2 96%    BMI 29.73 kg/m  Breath Sounds:Diminished O2 sats: stable throughout Patient's Current Condition: stable Specimens:  None Complications: No apparent complications Patient did tolerate procedure well.   [name] 11/11/2021, 6:23 PM

## 2021-11-11 NOTE — Progress Notes (Signed)
Patient with another episode with increase WOB, not getting volumes on ventilator again. Patient is holding his breath and bearing down causing his HR to drop down into the 60's from 120's and then slowly his sats begin to drop as well. Patient removed from vent and bagged for a minute and then placed back on ventilator and suctioned. Patient able to get volumes on ventilator now. Vitals stable, HR returned to 120's. Will continue to monitor.

## 2021-11-11 NOTE — Procedures (Signed)
Bronchoscopy Procedure Note  SEVERIN BOU  [id number]  03-16-1956  Date:11/11/21  Time:7:08 PM   Provider Performing:Charlissa Petros R Yonna Alwin   Procedure(s):  Flexible Bronchoscopy [phone])  Indication(s) desaturation  Consent Unable to obtain consent due to emergent nature of procedure.  Anesthesia 4 mg midazolam, 100 mcg fentanyl, 5cc 1% topical lidocaine   Time Out Verified patient identification, verified procedure, site/side was marked, verified correct patient position, special equipment/implants available, medications/allergies/relevant history reviewed, required imaging and test results available.   Sterile Technique Usual hand hygiene, masks, gowns, and gloves were used   Procedure Description Bronchoscope advanced through tracheostomy tube and into airway.  Airways were examined down to subsegmental level with findings noted below.   Following diagnostic evaluation, scope was removed.  Findings:  Trach in good position, a little high Area of narrowing (mass vs inflammation, suction trauma? L mainstem bronchus) Normal segmental anatomy, no secretions   Complications/Tolerance None; patient tolerated the procedure well. Chest X-ray is not needed post procedure.   EBL none   Specimen(s) N/a

## 2021-11-11 NOTE — Progress Notes (Signed)
Patient with increase WOB, not getting volumes on ventilator again. Patient is holding his breath and bearing down causing his HR to drop down into the 60's from 120's and then slowly his sats begin to drop as well. Patient removed from vent and bagged for a minute and then placed back on ventilator and suctioned. Patient able to get volumes on ventilator now. Vitals stable, HR returned to 120's. Will continue to monitor.

## 2021-11-11 NOTE — Progress Notes (Signed)
NAME:  Isaac [name], MRN:  [id number], DOB:  [birth year], LOS: 7 ADMISSION DATE:  11/04/2021, CONSULTATION DATE: 11/04/2021 REFERRING MD: Emergency room, CHIEF COMPLAINT: Acute respiratory failure  History of Present Illness:  65 yo male presented with dyspnea, fever, fatigue.  He required intubation in ER.  Positive for COVID 19.  Hospitalized in January 2022, April 2022 and September 2022 for AECOPD and PNA.  He has history of tracheostomy.  He has been on ibrutinib for CLL.  Followed by Dr. Chase Caller in pulmonary office for severe COPD.  Pertinent  Medical History  COPD, CLL, Anxiety, HTN, GERD  Significant Hospital Events:   12/10 Presents to ED requiring intubation  12/12 Extubated >> required reintubation 12/15 tracheostomy 12/16 PSV did well 12/17 very anxious, refusing TF, refusing TC trials  Studies:  PFT 06/08/14 >> FEV1 0.90 (24%), FEV1% 42, TLC 5.16 (72%), DLCO 26% CT angio chest 11/06/21 >> Rt greater than Lt patchy b/l GGO, emphysema, atherosclerosis  Interim history/Subjective:  More anxious, refusing vital interventions to progress care  Objective   Blood pressure 97/78, pulse (!) 116, temperature 100.2 F (37.9 C), temperature source Oral, resp. rate (!) 25, height 5\' 10"  (1.778 m), SpO2 94 %.    Vent Mode: PRVC FiO2 (%):  [40 %] 40 % Set Rate:  [22 bmp] 22 bmp Vt Set:  [580 mL] 580 mL PEEP:  [5 cmH20] 5 cmH20 Pressure Support:  [10 cmH20-15 cmH20] 15 cmH20 Plateau Pressure:  [20 cmH20-23 cmH20] 23 cmH20   Intake/Output Summary (Last 24 hours) at 11/11/2021 1313 Last data filed at 11/11/2021 1300 Gross per 24 hour  Intake 45.87 ml  Output 1650 ml  Net -1604.13 ml    There were no vitals filed for this visit.  Examination:  General - Awake and alert, writing notes despite sedation, Trach intact, NAD Eyes - pupils reactive ENT - trach site clean, sutures secure Cardiac - regular rate/rhythm, no murmur, R or G Chest - Bilateral chest excursion,  decreased BS, prolonged exp phase Abdomen - soft, non tender,ND,  + bowel sounds, Body mass index is 29.73 kg/m.  Extremities - no cyanosis, clubbing, or edema, no obvious deformities Skin - no rashes, no lesions , warm dry and intact, new trach with sutures Neuro - RASS 0   Assessment & Plan:   Acute hypoxic/hypercapnic respiratory failure. Failure to wean from ventilator s/p tracheostomy 11/09/21. - Daily vent weaning starting  12/16 - trach care - goal SpO2 > 92% - Consider ATC 12/16 pm as looks good weaning   Sepsis from [place] 19 pneumonia with possible bacterial pneumonia. - completed ABx, and remdesivir - decadron   Hx of COPD. - continue pulmicort, yupelri, brovana - prn albuterol - Aggressive Pulmonary Toilet - OOB to chair and mobilize - PT/OT - Sat Goals 88-92%   Hx of CLL. - diagnosed in 2019 - followed by Dr. Zola Button with oncology - f/u CBC - resume ibrutinib 12/17 - continue allopurinol  Anemia of critical illness and chronic disease. - f/u CBC - transfuse for Hb < 7   Sedation while on vent. Anxiety. - RASS goal 0 to -1 - prn versed - ativan oral increase to 1 mg BID (0.5 mg BID at home)  Hx of HTN. - continue lopressor - prn hydralazine with goal SBP < 170   Pressure injury. - stage 1 buttock, POA   Best Practice (right click and "Reselect all SmartList Selections" daily)   Diet/type: tube feeds DVT prophylaxis: Lovenox  GI prophylaxis: protonix Lines: N/A Foley:  Yes, and it is still needed Code Status:  full code  Labs    CMP Latest Ref Rng & Units 11/11/2021 11/10/2021 11/08/2021  Glucose 70 - 99 mg/dL 81 93 137(H)  BUN 8 - 23 mg/dL 28(H) 29(H) 34(H)  Creatinine 0.61 - 1.24 mg/dL 0.92 0.84 0.80  Sodium 135 - 145 mmol/L 143 144 139  Potassium 3.5 - 5.1 mmol/L 4.7 4.4 3.8  Chloride 98 - 111 mmol/L 102 105 99  CO2 22 - 32 mmol/L 30 33(H) 33(H)  Calcium 8.9 - 10.3 mg/dL 8.7(L) 8.4(L) 7.9(L)  Total Protein 6.5 - 8.1 g/dL -  5.0(L) -  Total Bilirubin 0.3 - 1.2 mg/dL - 1.0 -  Alkaline Phos 38 - 126 U/L - 56 -  AST 15 - 41 U/L - 20 -  ALT 0 - 44 U/L - 16 -    CBC Latest Ref Rng & Units 11/11/2021 11/10/2021 11/08/2021  WBC 4.0 - 10.5 K/uL 71.5(HH) 68.5(HH) 58.5(HH)  Hemoglobin 13.0 - 17.0 g/dL 10.2(L) 10.5(L) 10.4(L)  Hematocrit 39.0 - 52.0 % 36.1(L) 36.5(L) 36.0(L)  Platelets 150 - 400 K/uL 258 246 187    ABG    Component Value Date/Time   PHART 7.385 11/06/2021 1539   PCO2ART 64.4 (H) 11/06/2021 1539   PO2ART 138 (H) 11/06/2021 1539   HCO3 38.4 (H) 11/06/2021 1539   TCO2 40 (H) 11/06/2021 1539   ACIDBASEDEF 28.0 (H) 03/06/2021 0810   O2SAT 99.0 11/06/2021 1539    CBG (last 3)  Recent Labs    11/11/21 0337 11/11/21 0745 11/11/21 1139  GLUCAP 85 81 103*    Critical care time:   CRITICAL CARE Performed by: Bonna Gains Donella Pascarella   Total critical care time: 31 minutes  Critical care time was exclusive of separately billable procedures and treating other patients.  Critical care was necessary to treat or prevent imminent or life-threatening deterioration.  Critical care was time spent personally by me on the following activities: development of treatment plan with patient and/or surrogate as well as nursing, discussions with consultants, evaluation of patient's response to treatment, examination of patient, obtaining history from patient or surrogate, ordering and performing treatments and interventions, ordering and review of laboratory studies, ordering and review of radiographic studies, pulse oximetry and re-evaluation of patient's condition.   Lanier Clam, MD [place] for personal contact info PCCM on call pager [phone]  11/11/2021, 1:13 PM

## 2021-11-11 NOTE — Plan of Care (Signed)
Problem: Education: Goal: Knowledge of General Education information will improve Description: Including pain rating scale, medication(s)/side effects and non-pharmacologic comfort measures Outcome: Progressing   Problem: Health Behavior/Discharge Planning: Goal: Ability to manage health-related needs will improve Outcome: Progressing   Problem: Clinical Measurements: Goal: Ability to maintain clinical measurements within normal limits will improve Outcome: Progressing Goal: Will remain free from infection Outcome: Progressing Goal: Diagnostic test results will improve Outcome: Progressing Goal: Respiratory complications will improve Outcome: Progressing Goal: Cardiovascular complication will be avoided Outcome: Progressing   Problem: Activity: Goal: Risk for activity intolerance will decrease Outcome: Progressing   Problem: Nutrition: Goal: Adequate nutrition will be maintained Outcome: Progressing   Problem: Coping: Goal: Level of anxiety will decrease Outcome: Progressing   Problem: Elimination: Goal: Will not experience complications related to bowel motility Outcome: Progressing Goal: Will not experience complications related to urinary retention Outcome: Progressing   Problem: Pain Managment: Goal: General experience of comfort will improve Outcome: Progressing   Problem: Safety: Goal: Ability to remain free from injury will improve Outcome: Progressing   Problem: Skin Integrity: Goal: Risk for impaired skin integrity will decrease Outcome: Progressing   Problem: Education: Goal: Knowledge of risk factors and measures for prevention of condition will improve Outcome: Progressing   Problem: Coping: Goal: Psychosocial and spiritual needs will be supported Outcome: Progressing   Problem: Respiratory: Goal: Will maintain a patent airway Outcome: Progressing Goal: Complications related to the disease process, condition or treatment will be avoided or  minimized Outcome: Progressing   Problem: Safety: Goal: Non-violent Restraint(s) Outcome: Completed/Met

## 2021-11-11 NOTE — Progress Notes (Addendum)
Brief Nutrition Note:   Received page from [place] MD regarding pt's TF regimen. Pt is reporting discomfort/gas with current TF regimen. Per report, this has been an issue for pt in the past  Vital 1.5 at 50 ml/hr infusing at goal rate via Cortrak tube  Last BM 12/14, pt with scheduled bowel regimen  Interventions: Trial of changing TF formula Change to Osmolite 1.2 with goal rate of 65 ml/hr Continue Pro-Source TF 45 mL QID Plan to start at low rate with titration orders Start Osmolite 1.2 at 30 ml/hr; titrate by 10 mL q 8 hours until goal rate of 65 ml/hr Consider adding simethicone for gas  Kerman Passey MS, RDN, LDN, CNSC Registered Dietitian III Clinical Nutrition RD Pager and On-Call Pager Number Located in [place]

## 2021-11-12 ENCOUNTER — Encounter (HOSPITAL_COMMUNITY): Payer: Self-pay | Admitting: Pulmonary Disease

## 2021-11-12 ENCOUNTER — Other Ambulatory Visit: Payer: Self-pay

## 2021-11-12 ENCOUNTER — Inpatient Hospital Stay (HOSPITAL_COMMUNITY): Payer: BC Managed Care – PPO

## 2021-11-12 LAB — GLUCOSE, CAPILLARY
Glucose-Capillary: 122 mg/dL — ABNORMAL HIGH (ref 70–99)
Glucose-Capillary: 130 mg/dL — ABNORMAL HIGH (ref 70–99)
Glucose-Capillary: 133 mg/dL — ABNORMAL HIGH (ref 70–99)
Glucose-Capillary: 154 mg/dL — ABNORMAL HIGH (ref 70–99)
Glucose-Capillary: 160 mg/dL — ABNORMAL HIGH (ref 70–99)
Glucose-Capillary: 216 mg/dL — ABNORMAL HIGH (ref 70–99)

## 2021-11-12 LAB — BASIC METABOLIC PANEL
Anion gap: 6 (ref 5–15)
BUN: 36 mg/dL — ABNORMAL HIGH (ref 8–23)
CO2: 31 mmol/L (ref 22–32)
Calcium: 8.1 mg/dL — ABNORMAL LOW (ref 8.9–10.3)
Chloride: 102 mmol/L (ref 98–111)
Creatinine, Ser: 0.96 mg/dL (ref 0.61–1.24)
GFR, Estimated: >60 mL/min (ref >60)
Glucose, Bld: 146 mg/dL — ABNORMAL HIGH (ref 70–99)
Potassium: 4.4 mmol/L (ref 3.5–5.1)
Sodium: 139 mmol/L (ref 135–145)

## 2021-11-12 MED ORDER — OXYCODONE HCL 5 MG PO TABS
10.0000 mg | ORAL_TABLET | Freq: Four times a day (QID) | ORAL | Status: DC
  Administered 2021-11-12 – 2021-11-15 (×13): 10 mg
  Filled 2021-11-12 (×13): qty 2

## 2021-11-12 MED ORDER — POLYETHYLENE GLYCOL 3350 17 G PO PACK: 17.0000 g | PACK | Freq: Every day | ORAL | Status: DC | PRN

## 2021-11-12 MED ORDER — CLONAZEPAM 0.5 MG PO TABS
0.5000 mg | ORAL_TABLET | Freq: Two times a day (BID) | ORAL | Status: DC
  Administered 2021-11-12 – 2021-11-13 (×3): 0.5 mg
  Filled 2021-11-12 (×3): qty 1

## 2021-11-12 MED ORDER — DEXTROMETHORPHAN POLISTIREX ER 30 MG/5ML PO SUER
30.0000 mg | Freq: Two times a day (BID) | ORAL | Status: DC | PRN
  Administered 2021-11-12 – 2021-11-14 (×6): 30 mg
  Filled 2021-11-12 (×7): qty 5

## 2021-11-12 MED ORDER — FUROSEMIDE 10 MG/ML IJ SOLN
40.0000 mg | Freq: Once | INTRAMUSCULAR | Status: AC
  Administered 2021-11-12: 11:00:00 40 mg via INTRAVENOUS
  Filled 2021-11-12: qty 4

## 2021-11-12 MED ORDER — MIDAZOLAM HCL 2 MG/2ML IJ SOLN
2.0000 mg | INTRAMUSCULAR | Status: DC | PRN
  Administered 2021-11-12 – 2021-11-14 (×3): 2 mg via INTRAVENOUS
  Filled 2021-11-12 (×3): qty 2

## 2021-11-12 NOTE — Progress Notes (Signed)
Pt placed on 60% ATC and is tolerating well at this time. RN made aware. RT to continue to monitor.

## 2021-11-12 NOTE — Progress Notes (Signed)
NAME:  Isaac [name], MRN:  [id number], DOB:  1956-08-24, LOS: 8 ADMISSION DATE:  11/04/2021, CONSULTATION DATE: 11/04/2021 REFERRING MD: Emergency room, CHIEF COMPLAINT: Acute respiratory failure  History of Present Illness:  65 yo male presented with dyspnea, fever, fatigue.  He required intubation in ER.  Positive for COVID 19.  Hospitalized in January 2022, April 2022 and September 2022 for AECOPD and PNA.  He has history of tracheostomy.  He has been on ibrutinib for CLL.  Followed by Dr. Chase Caller in pulmonary office for severe COPD.  Pertinent  Medical History  COPD, CLL, Anxiety, HTN, GERD  Significant Hospital Events:   12/10 Presents to ED requiring intubation  12/12 Extubated >> required reintubation 12/15 tracheostomy 12/16 PSV did well 12/17 very anxious, refusing TF, refusing TC trials, Bronch with trach ok position, narrowing L mainstem, diuresed  Studies:  PFT 06/08/14 >> FEV1 0.90 (24%), FEV1% 42, TLC 5.16 (72%), DLCO 26% CT angio chest 11/06/21 >> Rt greater than Lt patchy b/l GGO, emphysema, atherosclerosis  Interim history/Subjective:  Bronch 12/17 clear trach ok position, narrowing L mainstem. Diuresed.   Objective   Blood pressure 97/82, pulse 80, temperature 98.3 F (36.8 C), temperature source Oral, resp. rate 19, height 5\' 10"  (1.778 m), SpO2 97 %.    Vent Mode: PRVC FiO2 (%):  [40 %-50 %] 40 % Set Rate:  [22 bmp] 22 bmp Vt Set:  [580 mL] 580 mL PEEP:  [5 cmH20] 5 cmH20 Pressure Support:  [15 cmH20] 15 cmH20 Plateau Pressure:  [17 cmH20-23 cmH20] 17 cmH20   Intake/Output Summary (Last 24 hours) at 11/12/2021 0804 Last data filed at 11/12/2021 0500 Gross per 24 hour  Intake 936 ml  Output 1750 ml  Net -814 ml    There were no vitals filed for this visit.  Examination:  General - Awake and alert, writing notes, Trach intact, NAD Eyes - pupils reactive ENT - trach site clean, sutures secure Cardiac - regular rate/rhythm, no murmur, R or  G Chest - Bilateral chest excursion, decreased BS, prolonged exp phase Abdomen - soft, non tender,ND,  + bowel sounds, Body mass index is 29.73 kg/m.  Extremities - no cyanosis, clubbing, or edema, no obvious deformities Skin - no rashes, no lesions , warm dry and intact, new trach with sutures Neuro - RASS 0   Assessment & Plan:   Acute hypoxic/hypercapnic respiratory failure. Failure to wean from ventilator s/p tracheostomy 11/09/21. - TCT, PSV during day, PRVC at night - trach care - goal SpO2 > 92%   Sepsis from COVID 19 pneumonia with possible bacterial pneumonia. - completed ABx, and remdesivir - decadron course ordered   Hx of COPD. - continue pulmicort, yupelri, brovana - prn albuterol - Aggressive Pulmonary Toilet - OOB to chair and mobilize - PT/OT - Sat Goals 88-92%   Hx of CLL. - diagnosed in 2019 - followed by Dr. Zola Button with oncology - f/u CBC - resume ibrutinib 12/17 - continue allopurinol  Anemia of critical illness and chronic disease. - f/u CBC - transfuse for Hb < 7  Anxiety. Major barrier to ongoing rehabilitation, likely main cause for episodes of respiratory distress - RASS goal 0 to -1 - prn versed - clonazepam 0.5 mg BID (ativan a home med)  Hx of HTN. - continue lopressor - prn hydralazine with goal SBP < 170   Pressure injury. - stage 1 buttock, POA   Best Practice (right click and "Reselect all SmartList Selections" daily)   Diet/type:  tube feeds DVT prophylaxis: Lovenox  GI prophylaxis: protonix Lines: N/A Foley:  Yes, and it is still needed Code Status:  full code  Labs    CMP Latest Ref Rng & Units 11/11/2021 11/10/2021 11/08/2021  Glucose 70 - 99 mg/dL 81 93 137(H)  BUN 8 - 23 mg/dL 28(H) 29(H) 34(H)  Creatinine 0.61 - 1.24 mg/dL 0.92 0.84 0.80  Sodium 135 - 145 mmol/L 143 144 139  Potassium 3.5 - 5.1 mmol/L 4.7 4.4 3.8  Chloride 98 - 111 mmol/L 102 105 99  CO2 22 - 32 mmol/L 30 33(H) 33(H)  Calcium 8.9 - 10.3  mg/dL 8.7(L) 8.4(L) 7.9(L)  Total Protein 6.5 - 8.1 g/dL - 5.0(L) -  Total Bilirubin 0.3 - 1.2 mg/dL - 1.0 -  Alkaline Phos 38 - 126 U/L - 56 -  AST 15 - 41 U/L - 20 -  ALT 0 - 44 U/L - 16 -    CBC Latest Ref Rng & Units 11/11/2021 11/10/2021 11/08/2021  WBC 4.0 - 10.5 K/uL 71.5(HH) 68.5(HH) 58.5(HH)  Hemoglobin 13.0 - 17.0 g/dL 10.2(L) 10.5(L) 10.4(L)  Hematocrit 39.0 - 52.0 % 36.1(L) 36.5(L) 36.0(L)  Platelets 150 - 400 K/uL 258 246 187    ABG    Component Value Date/Time   PHART 7.385 11/06/2021 1539   PCO2ART 64.4 (H) 11/06/2021 1539   PO2ART 138 (H) 11/06/2021 1539   HCO3 38.4 (H) 11/06/2021 1539   TCO2 40 (H) 11/06/2021 1539   ACIDBASEDEF 28.0 (H) 03/06/2021 0810   O2SAT 99.0 11/06/2021 1539    CBG (last 3)  Recent Labs    11/11/21 2339 11/12/21 0333 11/12/21 0747  GLUCAP 112* 133* 122*    Critical care time:   CRITICAL CARE Performed by: Bonna Gains Griffon Herberg   Total critical care time: 34 minutes  Critical care time was exclusive of separately billable procedures and treating other patients.  Critical care was necessary to treat or prevent imminent or life-threatening deterioration.  Critical care was time spent personally by me on the following activities: development of treatment plan with patient and/or surrogate as well as nursing, discussions with consultants, evaluation of patient's response to treatment, examination of patient, obtaining history from patient or surrogate, ordering and performing treatments and interventions, ordering and review of laboratory studies, ordering and review of radiographic studies, pulse oximetry and re-evaluation of patient's condition.   Lanier Clam, MD [place] for personal contact info PCCM on call pager [phone]  11/12/2021, 8:04 AM

## 2021-11-13 LAB — BASIC METABOLIC PANEL
Anion gap: 6 (ref 5–15)
BUN: 37 mg/dL — ABNORMAL HIGH (ref 8–23)
CO2: 32 mmol/L (ref 22–32)
Calcium: 7.9 mg/dL — ABNORMAL LOW (ref 8.9–10.3)
Chloride: 100 mmol/L (ref 98–111)
Creatinine, Ser: 0.85 mg/dL (ref 0.61–1.24)
GFR, Estimated: >60 mL/min (ref >60)
Glucose, Bld: 132 mg/dL — ABNORMAL HIGH (ref 70–99)
Potassium: 4.8 mmol/L (ref 3.5–5.1)
Sodium: 138 mmol/L (ref 135–145)

## 2021-11-13 LAB — GLUCOSE, CAPILLARY
Glucose-Capillary: 124 mg/dL — ABNORMAL HIGH (ref 70–99)
Glucose-Capillary: 141 mg/dL — ABNORMAL HIGH (ref 70–99)
Glucose-Capillary: 142 mg/dL — ABNORMAL HIGH (ref 70–99)
Glucose-Capillary: 166 mg/dL — ABNORMAL HIGH (ref 70–99)
Glucose-Capillary: 194 mg/dL — ABNORMAL HIGH (ref 70–99)
Glucose-Capillary: 83 mg/dL (ref 70–99)

## 2021-11-13 MED ORDER — ALBUTEROL SULFATE (2.5 MG/3ML) 0.083% IN NEBU
2.5000 mg | INHALATION_SOLUTION | RESPIRATORY_TRACT | Status: DC
  Administered 2021-11-13 – 2021-11-14 (×7): 2.5 mg via RESPIRATORY_TRACT
  Filled 2021-11-13 (×7): qty 3

## 2021-11-13 MED ORDER — CLONAZEPAM 1 MG PO TABS
1.0000 mg | ORAL_TABLET | Freq: Two times a day (BID) | ORAL | Status: DC
  Administered 2021-11-13 – 2021-11-15 (×4): 1 mg
  Filled 2021-11-13 (×4): qty 1

## 2021-11-13 MED ORDER — FUROSEMIDE 10 MG/ML IJ SOLN
40.0000 mg | Freq: Once | INTRAMUSCULAR | Status: AC
  Administered 2021-11-13: 09:00:00 40 mg via INTRAVENOUS
  Filled 2021-11-13: qty 4

## 2021-11-13 NOTE — Progress Notes (Signed)
RT was called to pts room for PRN neb tx. Pt wanted to be taken off the vent and placed back on ATC. Pt stated he felt like he was breathing better on the ATC. PRN albuterol was given and placed pt on 40% ATC. RN made aware.

## 2021-11-13 NOTE — Progress Notes (Signed)
Patient suspected eating ice from ice packs placed on him for fever earlier. Education done and patient verbalized he understood the risk by head nods. He request that speak come back to see him this afternoon. I will give them a call.

## 2021-11-13 NOTE — TOC Progression Note (Signed)
Transition of Care [facility]) - Initial/Assessment Note    Patient Details  Name: Isaac [name] MRN: [id number] Date of Birth: 10/17/1956  [facility] CM/SW Contact:    Milinda Antis, [place] Phone Number: 11/13/2021, 12:12 PM  Clinical Narrative:                 CSW spoke with the patient's spouse in reference to possible placement of the patient at d/c.  The patient's spouse is on board and would like the patient to go to [facility] as the patient has been there before and the family had a pleasant experience.     LTACH notified.          Patient Goals and CMS Choice        Expected Discharge Plan and Services                                                Prior Living Arrangements/Services                       Activities of Daily Living Home Assistive Devices/Equipment: Oxygen ADL Screening (condition at time of admission) Patient's cognitive ability adequate to safely complete daily activities?: Yes Is the patient deaf or have difficulty hearing?: No Does the patient have difficulty seeing, even when wearing glasses/contacts?: No Does the patient have difficulty concentrating, remembering, or making decisions?: No Patient able to express need for assistance with ADLs?: Yes Does the patient have difficulty dressing or bathing?: Yes Independently performs ADLs?: No Communication: Independent Dressing (OT): Needs assistance Is this a change from baseline?: Change from baseline, expected to last >3 days Grooming: Needs assistance Is this a change from baseline?: Change from baseline, expected to last >3 days Feeding: Independent Bathing: Needs assistance Is this a change from baseline?: Change from baseline, expected to last >3 days Toileting: Needs assistance Is this a change from baseline?: Change from baseline, expected to last >3days In/Out Bed: Needs assistance Is this a change from baseline?: Change from baseline, expected to  last >3 days Walks in Home: Needs assistance Is this a change from baseline?: Change from baseline, expected to last >3 days Does the patient have difficulty walking or climbing stairs?: Yes Weakness of Legs: None Weakness of Arms/Hands: None  Permission Sought/Granted                  Emotional Assessment              Admission diagnosis:  Acute respiratory failure ([place]) [J96.00] Acute respiratory failure with hypercapnia ([place]) [J96.02] COVID-19 [U07.1] Patient Active Problem List   Diagnosis Date Noted   Acute hypoxemic respiratory failure due to COVID-19 [facility])    COVID-19    Acute respiratory failure ([place]) 11/04/2021   Pressure injury of skin 11/04/2021   Respiratory arrest ([place]) 08/21/2021   Wound infection after surgery 08/21/2021   Hypotension 08/21/2021   Acute on chronic respiratory failure with hypoxia and hypercapnia ([place]) 08/21/2021   Volume overload 08/21/2021   Acute on chronic respiratory failure ([place]) 03/04/2021   HCAP (healthcare-associated pneumonia) 03/04/2021   Acute on chronic respiratory failure with hypoxia (HCC)    COPD, severe ([place])    Left lower lobe pneumonia    Chronic lymphocytic leukemia in remission ([place])    CLL (chronic lymphocytic leukemia) ([place])  Acute respiratory failure with hypercapnia (H[facility]) 12/08/2020   Hyperkalemia    Leukocytosis    AKI (acute kidney injury) ([place])    Healthcare maintenance 08/12/2020   Oral thrush 04/30/2017   Dysfunction of right eustachian tube 01/19/2016   Left ear impacted cerumen 01/19/2016   COPD, very severe ([place]) 08/22/2015   Chronic respiratory failure with hypercapnia ([place]) 06/08/2014   Unspecified sleep apnea 06/08/2014   Chronic diastolic heart failure ([place]) 05/11/2014   Acute-on-chronic respiratory failure ([place]) 04/24/2014   Sinusitis 04/13/2014   Renal insufficiency 07/07/2012   Hypertension 07/07/2012   COPD exacerbation ([place]) 07/04/2012   COPD, severe ([place]) 04/25/2012   Sleep apnea  04/25/2012   Obesity 04/25/2012   PCP:  [facility] [place]:   [id number] [place], [place] [address] Phone: [phone] Fax: [phone]  [facility] [place], [place] [address] [place] [address] Phone: [phone] Fax: [place] [address] Phone: [phone] Fax: [phone]     Social Determinants of Health (SDOH) Interventions    Readmission Risk Interventions No flowsheet data found.

## 2021-11-13 NOTE — Progress Notes (Signed)
Pt placed on 60% ATC. Pt is tolerating well at this time. RN aware.

## 2021-11-13 NOTE — Progress Notes (Signed)
RT responded to pts sats at 83%. Pt complaining of SOB. Pt was placed back on full vent support and given 100% O2. Pts. FIO2 decreased to 60% and sats are 94%. Pt resting comfortably on full vent support.

## 2021-11-13 NOTE — Progress Notes (Signed)
PCCM called to patient's room due to patient having increased anxiety and refusing to go back on ventilator when deciding on ATC.  When I arrived, patient was on ventilator PRVC and had received 2 Mg Versed.  Sats improved to 96%.  Patient speaking audibly around trach stating that he still feels like he cannot breathe.  Patient given fentanyl 100 mcg and waiting on pharmacy to approve hydroxyzine for anxiety.  Change ventilator mode to PSV 18/5 and added air to cuff and trach using minimal leak technique.  Patient sats and respiratory rate are improved.  Patient appears more comfortable and states he feels better.  Isaac [name] Rexene Agent [facility] 11/13/2021, 4:15 AM  Please see [email or web address] for pager details.  From 7A-7P if no response, please call [phone]. After hours, please call ELink [phone].

## 2021-11-13 NOTE — Progress Notes (Signed)
SLP Cancellation Note  Patient Details Name: Isaac [name] MRN: [id number] DOB: 10-18-1956   Cancelled treatment:  Pt back on full vent support - will follow for readiness for PMV/BSE after he transitions back to ATC.  If vent support is going to be longer term, we can try PMV inline.  Will follow.           Juan Quam Laurice 11/13/2021, 12:20 PM

## 2021-11-13 NOTE — Progress Notes (Signed)
NAME:  Isaac [name], MRN:  [id number], DOB:  1956-08-16, LOS: 9 ADMISSION DATE:  11/04/2021, CONSULTATION DATE: 11/04/2021 REFERRING MD: Emergency room, CHIEF COMPLAINT: Acute respiratory failure  History of Present Illness:  65 yo male presented with dyspnea, fever, fatigue.  He required intubation in ER.  Positive for COVID 19.  Hospitalized in January 2022, April 2022 and September 2022 for AECOPD and PNA.  He has history of tracheostomy.  He has been on ibrutinib for CLL.  Followed by Dr. Chase Caller in pulmonary office for severe COPD.  Pertinent  Medical History  COPD, CLL, Anxiety, HTN, GERD  Significant Hospital Events:   12/10 Presents to ED requiring intubation  12/12 Extubated >> required reintubation 12/15 tracheostomy 12/16 PSV did well 12/17 very anxious, refusing TF, refusing TC trials, Bronch with trach ok position, narrowing L mainstem, diuresed 12/18 better on TC, more anxious  Studies:  PFT 06/08/14 >> FEV1 0.90 (24%), FEV1% 42, TLC 5.16 (72%), DLCO 26% CT angio chest 11/06/21 >> Rt greater than Lt patchy b/l GGO, emphysema, atherosclerosis  Interim history/Subjective:  Doing ok, a bit more anxious overnight it seems, feels better on TC as opposed to ventilator  Objective   Blood pressure 134/78, pulse (!) 113, temperature 98.2 F (36.8 C), temperature source Oral, resp. rate (!) 24, height 5\' 10"  (1.778 m), SpO2 97 %.    Vent Mode: PSV FiO2 (%):  [40 %-60 %] 50 % Set Rate:  [22 bmp] 22 bmp Vt Set:  [580 mL] 580 mL PEEP:  [5 cmH20] 5 cmH20 Pressure Support:  [15 cmH20] 15 cmH20 Plateau Pressure:  [17 cmH20] 17 cmH20   Intake/Output Summary (Last 24 hours) at 11/13/2021 0854 Last data filed at 11/13/2021 0547 Gross per 24 hour  Intake 837.42 ml  Output 2925 ml  Net -2087.58 ml    There were no vitals filed for this visit.  Examination:  General - Awake and alert, writing notes, Trach intact, NAD Eyes - pupils reactive ENT - trach site clean,  sutures secure Cardiac - regular rate/rhythm, no murmur, R or G Chest - Bilateral chest excursion, decreased BS, prolonged exp phase Abdomen - soft, non tender,ND,  + bowel sounds, Body mass index is 29.73 kg/m.  Extremities - no cyanosis, clubbing, or edema, no obvious deformities Skin - no rashes, no lesions , warm dry and intact, new trach with sutures Neuro - RASS 0   Assessment & Plan:   Acute hypoxic/hypercapnic respiratory failure. Failure to wean from ventilator s/p tracheostomy 11/09/21. - TCT, 24/7 if able, PSV if need vent - trach care - goal SpO2 > 92% - SLP eval swallow and speaking valve   Sepsis from COVID 19 pneumonia with possible bacterial pneumonia. - completed ABx, and remdesivir - decadron course ordered Come off precautions 12/20?   Hx of COPD. - continue pulmicort, yupelri, brovana - prn albuterol - Aggressive Pulmonary Toilet - OOB to chair and mobilize - PT/OT - Sat Goals 88-92%   Hx of CLL. - diagnosed in 2019 - followed by Dr. Zola Button with oncology - f/u CBC - unable to resume ibrutinib until can swallow (can not crush via tube)  Anemia of critical illness and chronic disease. - f/u CBC - transfuse for Hb < 7  Anxiety. Major barrier to ongoing rehabilitation, likely main cause for episodes of respiratory distress - prn versed - clonazepam 0.5 mg BID (ativan a home med)  Hx of HTN. - continue lopressor - prn hydralazine with goal SBP <  170   Pressure injury. - stage 1 buttock, POA   Best Practice (right click and "Reselect all SmartList Selections" daily)   Diet/type: tube feeds DVT prophylaxis: Lovenox  GI prophylaxis: protonix Lines: N/A Foley:  Yes, and it is still needed Code Status:  full code  Labs    CMP Latest Ref Rng & Units 11/13/2021 11/12/2021 11/11/2021  Glucose 70 - 99 mg/dL 132(H) 146(H) 81  BUN 8 - 23 mg/dL 37(H) 36(H) 28(H)  Creatinine 0.61 - 1.24 mg/dL 0.85 0.96 0.92  Sodium 135 - 145 mmol/L 138 139  143  Potassium 3.5 - 5.1 mmol/L 4.8 4.4 4.7  Chloride 98 - 111 mmol/L 100 102 102  CO2 22 - 32 mmol/L 32 31 30  Calcium 8.9 - 10.3 mg/dL 7.9(L) 8.1(L) 8.7(L)  Total Protein 6.5 - 8.1 g/dL - - -  Total Bilirubin 0.3 - 1.2 mg/dL - - -  Alkaline Phos 38 - 126 U/L - - -  AST 15 - 41 U/L - - -  ALT 0 - 44 U/L - - -    CBC Latest Ref Rng & Units 11/11/2021 11/10/2021 11/08/2021  WBC 4.0 - 10.5 K/uL 71.5(HH) 68.5(HH) 58.5(HH)  Hemoglobin 13.0 - 17.0 g/dL 10.2(L) 10.5(L) 10.4(L)  Hematocrit 39.0 - 52.0 % 36.1(L) 36.5(L) 36.0(L)  Platelets 150 - 400 K/uL 258 246 187    ABG    Component Value Date/Time   PHART 7.385 11/06/2021 1539   PCO2ART 64.4 (H) 11/06/2021 1539   PO2ART 138 (H) 11/06/2021 1539   HCO3 38.4 (H) 11/06/2021 1539   TCO2 40 (H) 11/06/2021 1539   ACIDBASEDEF 28.0 (H) 03/06/2021 0810   O2SAT 99.0 11/06/2021 1539    CBG (last 3)  Recent Labs    11/12/21 2341 11/13/21 0301 11/13/21 0750  GLUCAP 160* 83 142*    Critical care time:   CRITICAL CARE Performed by: Bonna Gains Carles[place] Kasparek   Total critical care time: 34 minutes  Critical care time was exclusive of separately billable procedures and treating other patients.  Critical care was necessary to treat or prevent imminent or life-threatening deterioration.  Critical care was time spent personally by me on the following activities: development of treatment plan with patient and/or surrogate as well as nursing, discussions with consultants, evaluation of patient's response to treatment, examination of patient, obtaining history from patient or surrogate, ordering and performing treatments and interventions, ordering and review of laboratory studies, ordering and review of radiographic studies, pulse oximetry and re-evaluation of patient's condition.   Lanier Clam, MD [place] for personal contact info PCCM on call pager [phone]  11/13/2021, 8:54 AM

## 2021-11-13 NOTE — Progress Notes (Signed)
PT Cancellation Note  Patient Details Name: Isaac [name] MRN: [id number] DOB: November 14, 1956   Cancelled Treatment:    Reason Eval/Treat Not Completed: Patient not medically ready (pt with respiratory distress and just put back on vent at FIO2 60%)   [name] B Olina Melfi 11/13/2021, 12:17 PM [name], PT Acute Rehabilitation Services Pager: [phone] Office: [phone]

## 2021-11-14 DIAGNOSIS — L89301 Pressure ulcer of unspecified buttock, stage 1: Secondary | ICD-10-CM

## 2021-11-14 LAB — BASIC METABOLIC PANEL
Anion gap: 7 (ref 5–15)
BUN: 38 mg/dL — ABNORMAL HIGH (ref 8–23)
CO2: 36 mmol/L — ABNORMAL HIGH (ref 22–32)
Calcium: 7.8 mg/dL — ABNORMAL LOW (ref 8.9–10.3)
Chloride: 97 mmol/L — ABNORMAL LOW (ref 98–111)
Creatinine, Ser: 1.01 mg/dL (ref 0.61–1.24)
GFR, Estimated: >60 mL/min (ref >60)
Glucose, Bld: 94 mg/dL (ref 70–99)
Potassium: 4.6 mmol/L (ref 3.5–5.1)
Sodium: 140 mmol/L (ref 135–145)

## 2021-11-14 LAB — CBC WITH DIFFERENTIAL/PLATELET
Abs Immature Granulocytes: 0.37 10*3/uL — ABNORMAL HIGH (ref 0.00–0.07)
Basophils Absolute: 0.1 10*3/uL (ref 0.0–0.1)
Basophils Relative: 0 %
Eosinophils Absolute: 0 10*3/uL (ref 0.0–0.5)
Eosinophils Relative: 0 %
HCT: 33.8 % — ABNORMAL LOW (ref 39.0–52.0)
Hemoglobin: 10.1 g/dL — ABNORMAL LOW (ref 13.0–17.0)
Immature Granulocytes: 1 %
Lymphocytes Relative: 78 %
Lymphs Abs: 40.2 10*3/uL — ABNORMAL HIGH (ref 0.7–4.0)
MCH: 26.2 pg (ref 26.0–34.0)
MCHC: 29.9 g/dL — ABNORMAL LOW (ref 30.0–36.0)
MCV: 87.8 fL (ref 80.0–100.0)
Monocytes Absolute: 3 10*3/uL — ABNORMAL HIGH (ref 0.1–1.0)
Monocytes Relative: 6 %
Neutro Abs: 7.6 10*3/uL (ref 1.7–7.7)
Neutrophils Relative %: 15 %
Platelets: 248 10*3/uL (ref 150–400)
RBC: 3.85 MIL/uL — ABNORMAL LOW (ref 4.22–5.81)
RDW: 14.9 % (ref 11.5–15.5)
WBC: 51.2 10*3/uL (ref 4.0–10.5)
nRBC: 0 % (ref 0.0–0.2)

## 2021-11-14 LAB — MAGNESIUM: Magnesium: 2.5 mg/dL — ABNORMAL HIGH (ref 1.7–2.4)

## 2021-11-14 LAB — PHOSPHORUS: Phosphorus: 3.5 mg/dL (ref 2.5–4.6)

## 2021-11-14 LAB — GLUCOSE, CAPILLARY
Glucose-Capillary: 105 mg/dL — ABNORMAL HIGH (ref 70–99)
Glucose-Capillary: 108 mg/dL — ABNORMAL HIGH (ref 70–99)
Glucose-Capillary: 122 mg/dL — ABNORMAL HIGH (ref 70–99)
Glucose-Capillary: 127 mg/dL — ABNORMAL HIGH (ref 70–99)
Glucose-Capillary: 128 mg/dL — ABNORMAL HIGH (ref 70–99)
Glucose-Capillary: 132 mg/dL — ABNORMAL HIGH (ref 70–99)

## 2021-11-14 MED ORDER — HYDROXYZINE HCL 10 MG/5ML PO SYRP
50.0000 mg | ORAL_SOLUTION | Freq: Three times a day (TID) | ORAL | Status: DC | PRN
  Administered 2021-11-15: 03:00:00 50 mg
  Filled 2021-11-14 (×3): qty 25

## 2021-11-14 MED ORDER — ACETAMINOPHEN 160 MG/5ML PO SOLN
650.0000 mg | Freq: Four times a day (QID) | ORAL | Status: AC | PRN
  Administered 2021-11-14: 16:00:00 650 mg via ORAL
  Filled 2021-11-14: qty 20.3

## 2021-11-14 MED ORDER — HYDROXYZINE HCL 10 MG/5ML PO SYRP
50.0000 mg | ORAL_SOLUTION | Freq: Three times a day (TID) | ORAL | Status: DC | PRN
  Administered 2021-11-14: 08:00:00 50 mg
  Filled 2021-11-14 (×2): qty 25

## 2021-11-14 MED ORDER — ENSURE ENLIVE PO LIQD
237.0000 mL | Freq: Three times a day (TID) | ORAL | Status: DC
  Administered 2021-11-14 – 2021-11-15 (×3): 237 mL via ORAL

## 2021-11-14 NOTE — Progress Notes (Signed)
Pt placed back on full vent support due to increased WOB. RN at bedside.

## 2021-11-14 NOTE — Progress Notes (Signed)
Nutrition Follow-up  DOCUMENTATION CODES:   Not applicable  INTERVENTION:   - Ensure Enlive po TID, each supplement provides 350 kcal and 20 grams of protein  Per MD, continue continuous tube feeds at goal rate via Cortrak: - Osmolite 1.2 @ 65 ml/hr (1560 ml/day) - ProSource TF 45 ml QID - Free water flushes of 200 ml q 8 hours  Tube feeding regimen provides 2032 kcal, 131 grams of protein, and 1279 ml of H2O (meets 88% of kcal needs and 100% of protein needs).  Total free water with flushes: 1879 ml  - RD will monitor for ability to transition to nocturnal tube feeds; recommend transitioning to nocturnal tube feeds on 12/21  - Please obtain admission weight  NUTRITION DIAGNOSIS:   Increased nutrient needs related to acute illness (COVID pneumonia) as evidenced by estimated needs.  Ongoing, being addressed via TF, diet advancement, and oral nutrition supplements  GOAL:   Patient will meet greater than or equal to 90% of their needs  Progressing  MONITOR:   Vent status, Labs, Weight trends, TF tolerance, Skin  REASON FOR ASSESSMENT:   Ventilator, Consult Enteral/tube feeding initiation and management  ASSESSMENT:   65 year old male who presented to the ED on 12/10 with respiratory distress after testing positive for COVID-19. Pt required intubation. PMH of CLL and COPD. Pt admitted with acute respiratory failure, COVID pneumonia.  12/12 - extubated, later reintubated 12/15 - s/p tracheostomy 12/16 - Cortrak placed (tip gastric) 12/17 - TF regimen changed due to pt reporting discomfort/gas 12/18 - trach collar trials 12/20 - s/p FEES, diet advanced to full liquids  Discussed pt with RN and during ICU rounds. Pt back on trach collar today. Pt passed FEES this morning and is now on a full liquid diet. Recommend transitioning to nocturnal TF to promote PO intake but MD would like to continue with 24-hour feeds at goal rate today. RD will reassess tomorrow for ability  to transition to nocturnal feeds. Will order oral nutrition supplements to aid pt in meeting kcal and protein needs via PO route.  Pt with non-pitting edema to BUE and BLE.  Patient is currently intubated on ventilator support MV: 14.8 L/min Temp (24hrs), Avg:99.2 F (37.3 C), Min:98.4 F (36.9 C), Max:99.9 F (37.7 C)  Medications reviewed and include: SSI q 4 hours, protonix  Labs reviewed: BUN 38, magnesium 2.5, WBC 51.2 CBG's: 105-194 x 24 hours  UOP: 1850 ml x 24 hours I/O's: +1.4 L since admit  Diet Order:   Diet Order             Diet full liquid Room service appropriate? Yes with Assist; Fluid consistency: Thin  Diet effective now                   EDUCATION NEEDS:   Not appropriate for education at this time  Skin:  Skin Assessment: Skin Integrity Issues: Stage I: buttocks  Last BM:  11/13/21 small type 6  Height:   Ht Readings from Last 1 Encounters:  11/04/21 5\' 10"  (1.778 m)    Weight:   Wt Readings from Last 1 Encounters:  10/27/21 94 kg    BMI:  Body mass index is 29.73 kg/m.  Estimated Nutritional Needs:   Kcal:  2300-2500  Protein:  120-140 grams  Fluid:  >/= 2.0 L    Gustavus Bryant, MS, RD, LDN Inpatient Clinical Dietitian Please see AMiON for contact information.

## 2021-11-14 NOTE — Progress Notes (Signed)
Discussed with Dr. Alen Blew that patient has been unable to receive home ibrutinib due to NPO status and intermittently going back on the ventilator.  Decision made to hold medication until discharge. Pharmacy is still storing the medication in the pharmacy until discharge. Discussed with nurse and removed as active order. Will speak to patient and patient's wife to relay this information.  Please contact me with any further questions. Thank you  Thank you for allowing pharmacy to be a part of this patients care.  Donnald Garre, PharmD Clinical Pharmacist  Please check AMION for all [place] numbers After 10:00 PM, call [place] [phone]

## 2021-11-14 NOTE — Evaluation (Signed)
[facility] Speaking Valve - Evaluation Patient Details  Name: Isaac [name] MRN: [id number] Date of Birth: 1955/12/17  Today's Date: 11/14/2021 Time: 1000-1040 SLP Time Calculation (min) (ACUTE ONLY): 40 min  Past Medical History:  Past Medical History:  Diagnosis Date   Acute on chronic respiratory failure with hypoxia ([place])    Anxiety    Chronic lymphocytic leukemia in remission (HCC)    COPD (chronic obstructive pulmonary disease) (HCC)    COPD, severe (HCC)    GERD (gastroesophageal reflux disease)    Hematuria    Hyperlipidemia    Hypertension    Insomnia    Left lower lobe pneumonia    Nephrolithiasis    Tobacco dependence    Past Surgical History:  Past Surgical History:  Procedure Laterality Date   LITHOTRIPSY     HPI:  65 yo male presented 12/10 with dyspnea, fever, fatigue.  He required intubation in ER 12/10-12; reintubated 12/12; trach 12/15; beginning TC trials.  Positive for COVID 19.  Hospitalized in January 2022, April 2022 and September 2022 for AECOPD and PNA.  He has history of tracheostomy.  He has been on ibrutinib for CLL.  Followed by Dr. Chase Caller in pulmonary office for severe COPD.    Assessment / Plan / Recommendation  Clinical Impression  Pt did beautifully with PMV during initial assessment. He was transitioned from vent to TC by nurse; SLP deflated cuff - there was no coughing; no audible or visible secretions.  HR ~100, Sp02 87-92%, RR low 30s.  Valve was placed and the ranges above remained stable.  Pt achieved good quality phonation, normal volume, excellent speech clarity. Recommend he use valve with full supervision and when on TC.  SLP will f/u for instruction/teaching pt to donn and doff valve independently. D/W RN, Dr. Tacy Learn, pt, and RT. SLP Visit Diagnosis: Aphonia (R49.1)    SLP Assessment  Patient needs continued Speech [facility]    Recommendations for follow up therapy are one component of a multi-disciplinary  discharge planning process, led by the attending physician.  Recommendations may be updated based on patient status, additional functional criteria and insurance authorization.  Follow Up Recommendations       Assistance Recommended at Discharge Frequent or constant Supervision/Assistance  Functional Status Assessment Patient has had a recent decline in their functional status and demonstrates the ability to make significant improvements in function in a reasonable and predictable amount of time.  Frequency and Duration min 3x week  2 weeks    PMSV Trial PMSV was placed for: one hour between two assessments Able to redirect subglottic air through upper airway: Yes Able to Attain Phonation: Yes Voice Quality: Normal Able to Expectorate Secretions: No attempts Breath Support for Phonation: Adequate Intelligibility: Intelligible Respirations During Trial: 30 SpO2 During Trial:  (87-92%) Pulse During Trial: 103 Behavior: Alert;Cooperative;Expresses self well   Tracheostomy Tube  Additional Tracheostomy Tube Assessment Fenestrated: No    Vent Dependency  Vent Dependent: No Vent Mode: PRVC Set Rate: 22 bmp PEEP: 5 cmH20 FiO2 (%): 40 % Vt Set: 580 mL    Cuff Deflation Trial Tolerated Cuff Deflation: Yes Length of Time for Cuff Deflation Trial: 1 hour Behavior: Alert;Controlled;Cooperative  [place] L. Tivis Ringer, [place] CCC/SLP Acute Rehabilitation Services Office number [phone] Pager [phone]        Juan Quam Laurice 11/14/2021, 11:28 AM

## 2021-11-14 NOTE — Progress Notes (Signed)
Physical Therapy Treatment Patient Details Name: Isaac [name] MRN: [id number] DOB: 11-Feb-1956 Today's Date: 11/14/2021   History of Present Illness 65 yo admitted 12/10 with dyspnea requiring intubation in ED and trach 12/15. Pt Covid (+) on admission. PMhx: admissions Jan, April, Sept 2022 for AECOPD and PNA, CLL, anxiety, obesity, OSA, HTN    PT Comments    Pt pleasant and calm this session on 60% FIO2 trach collar with bump to 80% trach collar during transfer with SPo2 82-92% throughout session. Pt with significant fatigue and weakness warranting 2 person assist for pivots and standing pericare. Pt encouraged to continue bed level assist with nursing and even sitting EOB but lift recommended for OOB if not a lateral drop arm scoot. Will continue to follow.     Recommendations for follow up therapy are one component of a multi-disciplinary discharge planning process, led by the attending physician.  Recommendations may be updated based on patient status, additional functional criteria and insurance authorization.  Follow Up Recommendations  PT at [facility]-term acute care hospital     Assistance Recommended at Discharge Frequent or constant Supervision/Assistance  Equipment Recommendations  Rollator (4 wheels);Wheelchair (measurements PT)    Recommendations for Other Services       Precautions / Restrictions Precautions Precautions: Fall Precaution Comments: trach, vent at times, watch HR, anxiety     Mobility  Bed Mobility Overal bed mobility: Needs Assistance Bed Mobility: Supine to Sit;Sit to Supine;Rolling Rolling: Min guard   Supine to sit: Min assist;HOB elevated     General bed mobility comments: HOb 20 degrees with HHA to fully elevate trunk to EOB. Pt able to scoot along EOB with min assist.Return to bed with mod assist to bring legs to surface.    Transfers Overall transfer level: Needs assistance   Transfers: Bed to chair/wheelchair/BSC;Sit to/from  Stand Sit to Stand: Max assist   Squat pivot transfers: Max assist;+2 physical assistance       General transfer comment: initial transfer from bed to [facility] with squat pivot with mod assist with rapid fatigue during transfer. Pt attempted sit to stand x 2 with max assist with bil knees blocked, belt and assist for balance for total assist pericare in standing. Squat pivot BSC to bed with max +2 and bil knees blocked    Ambulation/Gait               General Gait Details: unable this session   Stairs             Wheelchair Mobility    Modified Rankin (Stroke Patients Only)       Balance Overall balance assessment: Needs assistance Sitting-balance support: Bilateral upper extremity supported Sitting balance-Leahy Scale: Fair Sitting balance - Comments: EOb with guarding for lines and safety   Standing balance support: Bilateral upper extremity supported Standing balance-Leahy Scale: Zero Standing balance comment: bil UE support and knees blocked                            Cognition Arousal/Alertness: Awake/alert Behavior During Therapy: Flat affect Overall Cognitive Status: Impaired/Different from baseline Area of Impairment: Safety/judgement;Problem solving                         Safety/Judgement: Decreased awareness of deficits;Decreased awareness of safety   Problem Solving: Slow processing;Requires verbal cues;Requires tactile cues General Comments: pt with decreased awareness of functional deficits Functional Status Assessment: Patient has had  a recent decline in their functional status and demonstrates the ability to make significant improvements in function in a reasonable and predictable amount of time.      Exercises      General Comments        Pertinent Vitals/Pain Pain Assessment: No/denies pain    Home Living                          Prior Function            PT Goals (current goals can now be found  in the care plan section) Progress towards PT goals: Progressing toward goals    Frequency    Min 3X/week      PT Plan Discharge plan needs to be updated    Co-evaluation PT/OT/SLP Co-Evaluation/Treatment: Yes Reason for Co-Treatment: Complexity of the patient's impairments (multi-system involvement);For patient/therapist safety PT goals addressed during session: Mobility/safety with mobility;Strengthening/ROM;Balance        AM-PAC PT "6 Clicks" Mobility   Outcome Measure  Help needed turning from your back to your side while in a flat bed without using bedrails?: A Little Help needed moving from lying on your back to sitting on the side of a flat bed without using bedrails?: A Little Help needed moving to and from a bed to a chair (including a wheelchair)?: Total Help needed standing up from a chair using your arms (e.g., wheelchair or bedside chair)?: Total Help needed to walk in hospital room?: Total Help needed climbing 3-5 steps with a railing? : Total 6 Click Score: 10    End of Session Equipment Utilized During Treatment: Gait belt Activity Tolerance: Patient limited by fatigue Patient left: in bed;with call bell/phone within reach;with nursing/sitter in room Nurse Communication: Mobility status PT Visit Diagnosis: Other abnormalities of gait and mobility (R26.89);Muscle weakness (generalized) (M62.81);Difficulty in walking, not elsewhere classified (R26.2)     Time: 1740-8144 PT Time Calculation (min) (ACUTE ONLY): 44 min  Charges:  $Therapeutic Activity: 23-37 mins                     Stephens Shreve P, PT Acute Rehabilitation Services Pager: [phone] Office: [place] 11/14/2021, 1:43 PM

## 2021-11-14 NOTE — Procedures (Signed)
Objective Swallowing Evaluation: Type of Study: FEES-Fiberoptic Endoscopic Evaluation of Swallow   Patient Details  Name: Isaac [name] MRN: [id number] Date of Birth: March 10, 1956  Today's Date: 11/14/2021 Time: SLP Start Time (ACUTE ONLY): 39 -SLP Stop Time (ACUTE ONLY): 1115  SLP Time Calculation (min) (ACUTE ONLY): 35 min   Past Medical History:  Past Medical History:  Diagnosis Date   Acute on chronic respiratory failure with hypoxia (HCC)    Anxiety    Chronic lymphocytic leukemia in remission (HCC)    COPD (chronic obstructive pulmonary disease) (HCC)    COPD, severe (HCC)    GERD (gastroesophageal reflux disease)    Hematuria    Hyperlipidemia    Hypertension    Insomnia    Left lower lobe pneumonia    Nephrolithiasis    Tobacco dependence    Past Surgical History:  Past Surgical History:  Procedure Laterality Date   LITHOTRIPSY     HPI: 65 yo male presented 12/10 with dyspnea, fever, fatigue.  He required intubation in ER 12/10-12; reintubated 12/12; trach 12/15; beginning TC trials.  Positive for COVID 19.  Hospitalized in January 2022, April 2022 and September 2022 for AECOPD and PNA.  He has history of tracheostomy.  He has been on ibrutinib for CLL.  Followed by Dr. Chase Caller in pulmonary office for severe COPD.   Subjective: alert, communicative    Recommendations for follow up therapy are one component of a multi-disciplinary discharge planning process, led by the attending physician.  Recommendations may be updated based on patient status, additional functional criteria and insurance authorization.  Assessment / Plan / Recommendation  Clinical Impressions 11/14/2021  Clinical Impression Pt presents with functional oropharyngeal swallow.  He used PMV throughout the study.  The larynx looked healthy with no edema or erythema; there was good bilateral mobility of the vocal folds.  Notable was spillage of thin liquids to the pyriform sinuses prior to trigger  of the pharyngeal swallow.  There was barely discernable penetration of thins within the laryngeal vestibule, as well as trace presence in interarytenoid space - however, there was no aspiration, even when taxed with multiple, successive boluses from a straw.  Purees were consumed with good clearance through the pharynx and UES; there was no residue, no penetration within the larynx.  PMV was removed near end of session and more thin liquids were offered - removal of valve did not appear to change the physiology of the swallow. However it would be optimal to use valve with PO intake to engage glottis for cough.  Initiate full liquids; D/W Dr. Tacy Learn. SLP will follow for diet progression.  SLP Visit Diagnosis Dysphagia, pharyngeal phase (R13.13)  Attention and concentration deficit following --  Frontal lobe and executive function deficit following --  Impact on safety and function --      Treatment Recommendations 11/14/2021  Treatment Recommendations Therapy as outlined in treatment plan below     Prognosis 11/14/2021  Prognosis for Safe Diet Advancement Good  Barriers to Reach Goals --  Barriers/Prognosis Comment --    Diet Recommendations 11/14/2021  SLP Diet Recommendations Other (Comment)  Liquid Administration via Cup;Straw  Medication Administration Whole meds with puree  Compensations Minimize environmental distractions  Postural Changes --      Other Recommendations 11/14/2021  Recommended Consults --  Oral Care Recommendations Oral care QID  Other Recommendations Place PMSV during PO intake  Follow Up Recommendations --  Assistance recommended at discharge Frequent or constant Supervision/Assistance  Functional Status Assessment  Patient has had a recent decline in their functional status and demonstrates the ability to make significant improvements in function in a reasonable and predictable amount of time.    Frequency and Duration  11/14/2021  Speech Therapy Frequency  (ACUTE ONLY) min 3x week  Treatment Duration 2 weeks      Oral Phase 11/14/2021  Oral Phase WFL  Oral - Pudding Teaspoon --  Oral - Pudding Cup --  Oral - Honey Teaspoon --  Oral - Honey Cup --  Oral - Nectar Teaspoon --  Oral - Nectar Cup --  Oral - Nectar Straw --  Oral - Thin Teaspoon --  Oral - Thin Cup --  Oral - Thin Straw --  Oral - Puree --  Oral - Mech Soft --  Oral - Regular --  Oral - Multi-Consistency --  Oral - Pill --  Oral Phase - Comment --    Pharyngeal Phase 11/14/2021  Pharyngeal Phase WFL  Pharyngeal- Pudding Teaspoon --  Pharyngeal --  Pharyngeal- Pudding Cup --  Pharyngeal --  Pharyngeal- Honey Teaspoon --  Pharyngeal --  Pharyngeal- Honey Cup --  Pharyngeal --  Pharyngeal- Nectar Teaspoon --  Pharyngeal --  Pharyngeal- Nectar Cup --  Pharyngeal --  Pharyngeal- Nectar Straw --  Pharyngeal --  Pharyngeal- Thin Teaspoon --  Pharyngeal --  Pharyngeal- Thin Cup --  Pharyngeal --  Pharyngeal- Thin Straw --  Pharyngeal --  Pharyngeal- Puree --  Pharyngeal --  Pharyngeal- Mechanical Soft --  Pharyngeal --  Pharyngeal- Regular --  Pharyngeal --  Pharyngeal- Multi-consistency --  Pharyngeal --  Pharyngeal- Pill --  Pharyngeal --  Pharyngeal Comment --     Cervical Esophageal Phase  03/07/2021  Cervical Esophageal Phase WFL  Pudding Teaspoon --  Pudding Cup --  Honey Teaspoon --  Honey Cup --  Nectar Teaspoon --  Nectar Cup --  Nectar Straw --  Thin Teaspoon --  Thin Cup --  Thin Straw --  Puree --  Mechanical Soft --  Regular --  Multi-consistency --  Pill --  Cervical Esophageal Comment --     Juan Quam Laurice 11/14/2021, 11:43 AM

## 2021-11-14 NOTE — Progress Notes (Signed)
NAME:  Isaac [name], MRN:  [id number], DOB:  1955/12/01, LOS: 57 ADMISSION DATE:  11/04/2021, CONSULTATION DATE: 11/04/2021 REFERRING MD: Emergency room, CHIEF COMPLAINT: Acute respiratory failure  History of Present Illness:  65 yo male presented with dyspnea, fever, fatigue.  He required intubation in ER.  Positive for COVID 19.  Hospitalized in January 2022, April 2022 and September 2022 for AECOPD and PNA.  He has history of tracheostomy.  He has been on ibrutinib for CLL.  Followed by Dr. Chase Caller in pulmonary office for severe COPD.  Pertinent  Medical History  COPD, CLL, Anxiety, HTN, GERD  Significant Hospital Events:   12/10 Presents to ED requiring intubation  12/12 Extubated >> required reintubation 12/15 tracheostomy 12/16 PSV did well 12/17 very anxious, refusing TF, refusing TC trials, Bronch with trach ok position, narrowing L mainstem, diuresed 12/18 better on TC, more anxious  Studies:  PFT 06/08/14 >> FEV1 0.90 (24%), FEV1% 42, TLC 5.16 (72%), DLCO 26% CT angio chest 11/06/21 >> Rt greater than Lt patchy b/l GGO, emphysema, atherosclerosis  Interim history/Subjective:  No overnight issues Patient remained anxious  Objective   Blood pressure 120/72, pulse (!) 113, temperature 98.4 F (36.9 C), temperature source Oral, resp. rate (!) 34, height 5\' 10"  (1.778 m), SpO2 95 %.    Vent Mode: PRVC FiO2 (%):  [40 %-60 %] 40 % Set Rate:  [22 bmp] 22 bmp Vt Set:  [580 mL] 580 mL PEEP:  [5 cmH20] 5 cmH20 Plateau Pressure:  [17 cmH20-23 cmH20] 18 cmH20   Intake/Output Summary (Last 24 hours) at 11/14/2021 0933 Last data filed at 11/14/2021 0540 Gross per 24 hour  Intake 650 ml  Output 1850 ml  Net -1200 ml   There were no vitals filed for this visit.  Examination: General: Acute on chronically chronically ill-appearing male, lying on the bed HEENT: [place]/AT, eyes anicteric.  moist mucus membranes, s/p trach Neuro: Alert, awake following commands Chest:  Reduced air entry all over, no wheezes or rhonchi Heart: Tachycardic, regular rhythm, no murmurs or gallops Abdomen: Soft, nontender, nondistended, bowel sounds present Skin: No rash  Assessment & Plan:  Acute hypoxic/hypercapnic respiratory failure s/p tracheostomy Due to his acute COPD exacerbation Patient is tolerating spontaneous breathing trial We will try trach collar as long as tolerated Patient is very anxious that is preventing him from coming off of ventilator completely Continue routine trach care SLP eval swallow and speaking valve Continue pulmicort, yupelri, brovana Continue as needed albuterol Aggressive pulmonary toileting PT/OT  Sepsis from COVID 19 pneumonia with possible bacterial pneumonia, improved. Sepsis indices improved Completed ABx, Decadron and remdesivir Continue airborne and contact isolation   CLL Diagnosed in 2019 Followed by Dr. Zola Button with oncology White count remains elevated due to CLL Unable to resume ibrutinib until can swallow (can not crush via tube), per oncology we can hold off for now  Anemia of critical illness and chronic disease. Patient H&H remained stable Transfuse for Hb < 7  Anxiety. Major barrier to ongoing rehabilitation, likely main cause for episodes of respiratory distress Continue Klonopin 1 mg 3 times daily Continue hydroxyzine Avoid deep sedation  HTN. Blood pressure is well controlled Continue lopressor and prn hydralazine with goal SBP < 170   Stage I pressure injury on buttock, POA Continue wound care Change position every 2 hours   Best Practice (right click and "Reselect all SmartList Selections" daily)   Diet/type: tube feeds DVT prophylaxis: Lovenox  GI prophylaxis: protonix Lines: N/A Foley:  Yes, and it is still needed Code Status:  full code  Labs    CMP Latest Ref Rng & Units 11/14/2021 11/13/2021 11/12/2021  Glucose 70 - 99 mg/dL 94 132(H) 146(H)  BUN 8 - 23 mg/dL 38(H) 37(H) 36(H)   Creatinine 0.61 - 1.24 mg/dL 1.01 0.85 0.96  Sodium 135 - 145 mmol/L 140 138 139  Potassium 3.5 - 5.1 mmol/L 4.6 4.8 4.4  Chloride 98 - 111 mmol/L 97(L) 100 102  CO2 22 - 32 mmol/L 36(H) 32 31  Calcium 8.9 - 10.3 mg/dL 7.8(L) 7.9(L) 8.1(L)  Total Protein 6.5 - 8.1 g/dL - - -  Total Bilirubin 0.3 - 1.2 mg/dL - - -  Alkaline Phos 38 - 126 U/L - - -  AST 15 - 41 U/L - - -  ALT 0 - 44 U/L - - -    CBC Latest Ref Rng & Units 11/14/2021 11/11/2021 11/10/2021  WBC 4.0 - 10.5 K/uL 51.2(HH) 71.5(HH) 68.5(HH)  Hemoglobin 13.0 - 17.0 g/dL 10.1(L) 10.2(L) 10.5(L)  Hematocrit 39.0 - 52.0 % 33.8(L) 36.1(L) 36.5(L)  Platelets 150 - 400 K/uL 248 258 246    ABG    Component Value Date/Time   PHART 7.385 11/06/2021 1539   PCO2ART 64.4 (H) 11/06/2021 1539   PO2ART 138 (H) 11/06/2021 1539   HCO3 38.4 (H) 11/06/2021 1539   TCO2 40 (H) 11/06/2021 1539   ACIDBASEDEF 28.0 (H) 03/06/2021 0810   O2SAT 99.0 11/06/2021 1539    CBG (last 3)  Recent Labs    11/13/21 2355 11/14/21 0342 11/14/21 0734  GLUCAP 124* 105* 132*   Critical care time:    Total critical care time: 36 minutes  Performed by: [place] care time was exclusive of separately billable procedures and treating other patients.   Critical care was necessary to treat or prevent imminent or life-threatening deterioration.   Critical care was time spent personally by me on the following activities: development of treatment plan with patient and/or surrogate as well as nursing, discussions with consultants, evaluation of patient's response to treatment, examination of patient, obtaining history from patient or surrogate, ordering and performing treatments and interventions, ordering and review of laboratory studies, ordering and review of radiographic studies, pulse oximetry and re-evaluation of patient's condition.   Jacky Kindle MD [facility] See Amion for pager If no response to pager, please  call [phone] until 7pm After 7pm, Please call E-link [phone]

## 2021-11-14 NOTE — Progress Notes (Signed)
Pt placed on 60% ATC. Pt is tolerating well at this time.

## 2021-11-14 NOTE — Progress Notes (Signed)
Occupational Therapy Treatment Patient Details Name: Isaac [name] MRN: [id number] DOB: 05/12/1956 Today's Date: 11/14/2021   History of present illness 65 yo admitted 12/10 with dyspnea requiring intubation in ED and trach 12/15. Pt Covid (+) on admission. PMhx: admissions Jan, April, Sept 2022 for AECOPD and PNA, CLL, anxiety, obesity, OSA, HTN   OT comments  Patient seated on BSC upon entry with PT.  Requires max assist +2 to return to EOB, attempting hygiene in standing with poor tolerance and ultimately requiring total assist from bed level to complete task.  Fatigued after toileting, resting in bed with PMV off. Was on trach collar at 80% with SpO2 82-92 during session, nursing at side upon therapist exit. Will follow acutely.    Recommendations for follow up therapy are one component of a multi-disciplinary discharge planning process, led by the attending physician.  Recommendations may be updated based on patient status, additional functional criteria and insurance authorization.    Follow Up Recommendations  OT at Long-term acute care hospital    Assistance Recommended at Discharge Frequent or constant Supervision/Assistance  Equipment Recommendations  None recommended by OT    Recommendations for Other Services      Precautions / Restrictions Precautions Precautions: Fall Precaution Comments: trach, vent at times, watch HR, anxiety Restrictions Weight Bearing Restrictions: No       Mobility Bed Mobility Overal bed mobility: Needs Assistance Bed Mobility: Sit to Supine Rolling: Min guard   Supine to sit: Min assist;HOB elevated Sit to supine: Mod assist   General bed mobility comments: return to supine with B LE support    Transfers Overall transfer level: Needs assistance   Transfers: Bed to chair/wheelchair/BSC;Sit to/from Stand Sit to Stand: Max assist     Squat pivot transfers: Max assist;+2 physical assistance     General transfer comment: initial  [facility] bed [facility] with squat pivot with mod assist with rapid fatigue during transfer. Pt attempted sit to stand x 2 with max assist with bil knees blocked, belt and assist for balance for total assist pericare in standing. Squat pivot BSC to bed with max +2 and bil knees blocked     Balance Overall balance assessment: Needs assistance Sitting-balance support: Bilateral upper extremity supported;Feet supported Sitting balance-Leahy Scale: Fair Sitting balance - Comments: EOb with guarding for lines and safety   Standing balance support: Bilateral upper extremity supported;During functional activity Standing balance-Leahy Scale: Zero Standing balance comment: bil UE support and knees blocked                           ADL either performed or assessed with clinical judgement   ADL Overall ADL's : Needs assistance/impaired                 Upper Body Dressing : Minimal assistance;Bed level Upper Body Dressing Details (indicate cue type and reason): changing gown     Toilet Transfer: Maximal assistance;+2 for physical assistance;+2 for safety/equipment;Stand-pivot;BSC/3in1 Toilet Transfer Details (indicate cue type and reason): from [facility] back to bed Toileting- Clothing Manipulation and Hygiene: Total assistance;+2 for physical assistance;+2 for safety/equipment;Sit to/from stand;Bed level Toileting - Clothing Manipulation Details (indicate cue type and reason): attempting in standing, but completing supine due to weakness/ fatigue     Functional mobility during ADLs: Maximal assistance;+2 for physical assistance;+2 for safety/equipment      Extremity/Trunk Assessment              Vision  Perception     Praxis      Cognition Arousal/Alertness: Awake/alert Behavior During Therapy: Flat affect Overall Cognitive Status: Impaired/Different from baseline Area of Impairment: Safety/judgement;Problem solving                          Safety/Judgement: Decreased awareness of safety;Decreased awareness of deficits   Problem Solving: Slow processing;Requires verbal cues;Requires tactile cues General Comments: pt with decreased safety awareness, slow processing Functional Status Assessment: Patient has had a recent decline in their functional status and demonstrates the ability to make significant improvements in function in a reasonable and predictable amount of time.        Exercises     Shoulder Instructions       General Comments      Pertinent Vitals/ Pain       Pain Assessment: No/denies pain  Home Living                                          Prior Functioning/Environment              Frequency  Min 2X/week        Progress Toward Goals  OT Goals(current goals can now be found in the care plan section)  Progress towards OT goals: Progressing toward goals     Plan Frequency remains appropriate;Discharge plan needs to be updated    Co-evaluation    PT/OT/SLP Co-Evaluation/Treatment: Yes Reason for Co-Treatment: Complexity of the patient's impairments (multi-system involvement);For patient/therapist safety;To address functional/ADL transfers PT goals addressed during session: Mobility/safety with mobility;Strengthening/ROM;Balance OT goals addressed during session: ADL's and self-care      AM-PAC OT "6 Clicks" Daily Activity     Outcome Measure   Help from another person eating meals?: A Little Help from another person taking care of personal grooming?: A Little Help from another person toileting, which includes using toliet, bedpan, or urinal?: Total Help from another person bathing (including washing, rinsing, drying)?: A Lot Help from another person to put on and taking off regular upper body clothing?: A Lot Help from another person to put on and taking off regular lower body clothing?: Total 6 Click Score: 12    End of Session Equipment Utilized During  Treatment: Other (comment) (trach collar)  OT Visit Diagnosis: Unsteadiness on feet (R26.81);Muscle weakness (generalized) (M62.81)   Activity Tolerance Patient tolerated treatment well   Patient Left in bed;with nursing/sitter in room;with call bell/phone within reach   Nurse Communication Mobility status        Time: 7510-2585 OT Time Calculation (min): 27 min  Charges: OT General Charges $OT Visit: 1 Visit OT Treatments $Self Care/Home Management : 8-22 mins  Jolaine Artist, OT [place] Pager [phone] Office [place] 11/14/2021, 2:00 PM

## 2021-11-15 ENCOUNTER — Other Ambulatory Visit (HOSPITAL_COMMUNITY): Payer: Self-pay

## 2021-11-15 ENCOUNTER — Other Ambulatory Visit (HOSPITAL_COMMUNITY): Payer: BC Managed Care – PPO

## 2021-11-15 ENCOUNTER — Inpatient Hospital Stay
Admission: RE | Admit: 2021-11-15 | Discharge: 2022-01-19 | Disposition: A | Discharge status: Skilled Nursing Facility | Payer: BC Managed Care – PPO | Attending: Internal Medicine | Admitting: Internal Medicine

## 2021-11-15 DIAGNOSIS — N390 Urinary tract infection, site not specified: Secondary | ICD-10-CM | POA: Diagnosis not present

## 2021-11-15 DIAGNOSIS — D3500 Benign neoplasm of unspecified adrenal gland: Secondary | ICD-10-CM | POA: Diagnosis not present

## 2021-11-15 DIAGNOSIS — Z9911 Dependence on respirator [ventilator] status: Secondary | ICD-10-CM | POA: Diagnosis not present

## 2021-11-15 DIAGNOSIS — C9111 Chronic lymphocytic leukemia of B-cell type in remission: Secondary | ICD-10-CM | POA: Diagnosis not present

## 2021-11-15 DIAGNOSIS — R6520 Severe sepsis without septic shock: Secondary | ICD-10-CM | POA: Diagnosis not present

## 2021-11-15 DIAGNOSIS — K5900 Constipation, unspecified: Secondary | ICD-10-CM | POA: Diagnosis not present

## 2021-11-15 DIAGNOSIS — J90 Pleural effusion, not elsewhere classified: Secondary | ICD-10-CM

## 2021-11-15 DIAGNOSIS — A411 Sepsis due to other specified staphylococcus: Secondary | ICD-10-CM | POA: Diagnosis not present

## 2021-11-15 DIAGNOSIS — R52 Pain, unspecified: Secondary | ICD-10-CM

## 2021-11-15 DIAGNOSIS — Z743 Need for continuous supervision: Secondary | ICD-10-CM | POA: Diagnosis not present

## 2021-11-15 DIAGNOSIS — R1310 Dysphagia, unspecified: Secondary | ICD-10-CM | POA: Diagnosis not present

## 2021-11-15 DIAGNOSIS — T85598A Other mechanical complication of other gastrointestinal prosthetic devices, implants and grafts, initial encounter: Secondary | ICD-10-CM

## 2021-11-15 DIAGNOSIS — U071 COVID-19: Secondary | ICD-10-CM | POA: Diagnosis present

## 2021-11-15 DIAGNOSIS — Z4682 Encounter for fitting and adjustment of non-vascular catheter: Secondary | ICD-10-CM | POA: Diagnosis not present

## 2021-11-15 DIAGNOSIS — D638 Anemia in other chronic diseases classified elsewhere: Secondary | ICD-10-CM | POA: Diagnosis not present

## 2021-11-15 DIAGNOSIS — E43 Unspecified severe protein-calorie malnutrition: Secondary | ICD-10-CM | POA: Diagnosis not present

## 2021-11-15 DIAGNOSIS — J9620 Acute and chronic respiratory failure, unspecified whether with hypoxia or hypercapnia: Secondary | ICD-10-CM | POA: Diagnosis not present

## 2021-11-15 DIAGNOSIS — Z8616 Personal history of COVID-19: Secondary | ICD-10-CM | POA: Diagnosis not present

## 2021-11-15 DIAGNOSIS — Z4659 Encounter for fitting and adjustment of other gastrointestinal appliance and device: Secondary | ICD-10-CM

## 2021-11-15 DIAGNOSIS — R079 Chest pain, unspecified: Secondary | ICD-10-CM | POA: Diagnosis not present

## 2021-11-15 DIAGNOSIS — Z9889 Other specified postprocedural states: Secondary | ICD-10-CM

## 2021-11-15 DIAGNOSIS — F419 Anxiety disorder, unspecified: Secondary | ICD-10-CM | POA: Diagnosis not present

## 2021-11-15 DIAGNOSIS — Z1621 Resistance to vancomycin: Secondary | ICD-10-CM | POA: Diagnosis not present

## 2021-11-15 DIAGNOSIS — Z430 Encounter for attention to tracheostomy: Secondary | ICD-10-CM | POA: Diagnosis not present

## 2021-11-15 DIAGNOSIS — A419 Sepsis, unspecified organism: Secondary | ICD-10-CM | POA: Diagnosis not present

## 2021-11-15 DIAGNOSIS — J9621 Acute and chronic respiratory failure with hypoxia: Secondary | ICD-10-CM | POA: Diagnosis present

## 2021-11-15 DIAGNOSIS — I700 Atherosclerosis of aorta: Secondary | ICD-10-CM | POA: Diagnosis not present

## 2021-11-15 DIAGNOSIS — I10 Essential (primary) hypertension: Secondary | ICD-10-CM | POA: Diagnosis not present

## 2021-11-15 DIAGNOSIS — J449 Chronic obstructive pulmonary disease, unspecified: Secondary | ICD-10-CM | POA: Diagnosis present

## 2021-11-15 DIAGNOSIS — Z8701 Personal history of pneumonia (recurrent): Secondary | ICD-10-CM | POA: Diagnosis not present

## 2021-11-15 DIAGNOSIS — M6281 Muscle weakness (generalized): Secondary | ICD-10-CM | POA: Diagnosis not present

## 2021-11-15 DIAGNOSIS — R531 Weakness: Secondary | ICD-10-CM | POA: Diagnosis not present

## 2021-11-15 DIAGNOSIS — J9690 Respiratory failure, unspecified, unspecified whether with hypoxia or hypercapnia: Secondary | ICD-10-CM

## 2021-11-15 DIAGNOSIS — Z9989 Dependence on other enabling machines and devices: Secondary | ICD-10-CM | POA: Diagnosis not present

## 2021-11-15 DIAGNOSIS — J189 Pneumonia, unspecified organism: Secondary | ICD-10-CM | POA: Diagnosis not present

## 2021-11-15 DIAGNOSIS — E46 Unspecified protein-calorie malnutrition: Secondary | ICD-10-CM | POA: Diagnosis not present

## 2021-11-15 DIAGNOSIS — R110 Nausea: Secondary | ICD-10-CM

## 2021-11-15 DIAGNOSIS — D61818 Other pancytopenia: Secondary | ICD-10-CM | POA: Diagnosis not present

## 2021-11-15 DIAGNOSIS — D649 Anemia, unspecified: Secondary | ICD-10-CM | POA: Diagnosis not present

## 2021-11-15 DIAGNOSIS — J156 Pneumonia due to other aerobic Gram-negative bacteria: Secondary | ICD-10-CM | POA: Diagnosis not present

## 2021-11-15 DIAGNOSIS — J918 Pleural effusion in other conditions classified elsewhere: Secondary | ICD-10-CM | POA: Diagnosis not present

## 2021-11-15 DIAGNOSIS — L89151 Pressure ulcer of sacral region, stage 1: Secondary | ICD-10-CM | POA: Diagnosis not present

## 2021-11-15 DIAGNOSIS — R000 Tachycardia, unspecified: Secondary | ICD-10-CM | POA: Diagnosis not present

## 2021-11-15 DIAGNOSIS — D696 Thrombocytopenia, unspecified: Secondary | ICD-10-CM | POA: Diagnosis not present

## 2021-11-15 DIAGNOSIS — C9110 Chronic lymphocytic leukemia of B-cell type not having achieved remission: Secondary | ICD-10-CM | POA: Diagnosis not present

## 2021-11-15 DIAGNOSIS — B999 Unspecified infectious disease: Secondary | ICD-10-CM | POA: Diagnosis not present

## 2021-11-15 DIAGNOSIS — J9610 Chronic respiratory failure, unspecified whether with hypoxia or hypercapnia: Secondary | ICD-10-CM | POA: Diagnosis not present

## 2021-11-15 DIAGNOSIS — M6250 Muscle wasting and atrophy, not elsewhere classified, unspecified site: Secondary | ICD-10-CM | POA: Diagnosis not present

## 2021-11-15 DIAGNOSIS — Z930 Tracheostomy status: Secondary | ICD-10-CM | POA: Diagnosis not present

## 2021-11-15 DIAGNOSIS — R109 Unspecified abdominal pain: Secondary | ICD-10-CM | POA: Diagnosis not present

## 2021-11-15 DIAGNOSIS — J9602 Acute respiratory failure with hypercapnia: Secondary | ICD-10-CM

## 2021-11-15 DIAGNOSIS — R1312 Dysphagia, oropharyngeal phase: Secondary | ICD-10-CM | POA: Diagnosis not present

## 2021-11-15 LAB — BLOOD GAS, ARTERIAL
Acid-Base Excess: 6.8 mmol/L — ABNORMAL HIGH (ref 0.0–2.0)
Allens test (pass/fail): POSITIVE — AB
Bicarbonate: 31.1 mmol/L — ABNORMAL HIGH (ref 20.0–28.0)
FIO2: 40
O2 Saturation: 90 %
Patient temperature: 38.6
pCO2 arterial: 50.8 mmHg — ABNORMAL HIGH (ref 32.0–48.0)
pH, Arterial: 7.412 (ref 7.350–7.450)
pO2, Arterial: 63.2 mmHg — ABNORMAL LOW (ref 83.0–108.0)

## 2021-11-15 LAB — BASIC METABOLIC PANEL
Anion gap: 7 (ref 5–15)
BUN: 37 mg/dL — ABNORMAL HIGH (ref 8–23)
CO2: 31 mmol/L (ref 22–32)
Calcium: 7.4 mg/dL — ABNORMAL LOW (ref 8.9–10.3)
Chloride: 97 mmol/L — ABNORMAL LOW (ref 98–111)
Creatinine, Ser: 1.24 mg/dL (ref 0.61–1.24)
GFR, Estimated: >60 mL/min (ref >60)
Glucose, Bld: 130 mg/dL — ABNORMAL HIGH (ref 70–99)
Potassium: 4.7 mmol/L (ref 3.5–5.1)
Sodium: 135 mmol/L (ref 135–145)

## 2021-11-15 LAB — PROCALCITONIN: Procalcitonin: <0.1 ng/mL

## 2021-11-15 LAB — GLUCOSE, CAPILLARY
Glucose-Capillary: 121 mg/dL — ABNORMAL HIGH (ref 70–99)
Glucose-Capillary: 108 mg/dL — ABNORMAL HIGH (ref 70–99)
Glucose-Capillary: 114 mg/dL — ABNORMAL HIGH (ref 70–99)
Glucose-Capillary: 140 mg/dL — ABNORMAL HIGH (ref 70–99)

## 2021-11-15 MED ORDER — REVEFENACIN 175 MCG/3ML IN SOLN: 175.0000 ug | Freq: Every day | RESPIRATORY_TRACT | Status: DC

## 2021-11-15 MED ORDER — BUDESONIDE 0.5 MG/2ML IN SUSP: 0.5000 mg | Freq: Two times a day (BID) | RESPIRATORY_TRACT | 0 refills | Status: AC

## 2021-11-15 MED ORDER — FREE WATER
200.0000 mL | Freq: Three times a day (TID) | Status: AC
Start: 2021-11-15 — End: ?

## 2021-11-15 MED ORDER — OXYCODONE HCL 10 MG PO TABS: 10.0000 mg | ORAL_TABLET | Freq: Four times a day (QID) | ORAL | 0 refills | Status: DC | PRN

## 2021-11-15 MED ORDER — CHLORHEXIDINE GLUCONATE 0.12% ORAL RINSE (MEDLINE KIT): 15.0000 mL | Freq: Two times a day (BID) | OROMUCOSAL | 0 refills | Status: DC

## 2021-11-15 MED ORDER — ENSURE ENLIVE PO LIQD: 237.0000 mL | Freq: Three times a day (TID) | ORAL | 12 refills | Status: DC

## 2021-11-15 MED ORDER — OSMOLITE 1.2 CAL PO LIQD: 1000.0000 mL | ORAL | Status: DC

## 2021-11-15 MED ORDER — PANTOPRAZOLE SODIUM 40 MG PO PACK: 40.0000 mg | PACK | Freq: Every day | ORAL | Status: DC

## 2021-11-15 MED ORDER — HYDROXYZINE HCL 10 MG/5ML PO SYRP: 50.0000 mg | ORAL_SOLUTION | Freq: Three times a day (TID) | ORAL | 0 refills | Status: DC | PRN

## 2021-11-15 MED ORDER — METOPROLOL TARTRATE 25 MG PO TABS: 25.0000 mg | ORAL_TABLET | Freq: Four times a day (QID) | ORAL | Status: AC

## 2021-11-15 MED ORDER — GABAPENTIN 250 MG/5ML PO SOLN: 100.0000 mg | Freq: Every day | ORAL | 12 refills | Status: DC

## 2021-11-15 MED ORDER — PROSOURCE TF PO LIQD: 45.0000 mL | Freq: Four times a day (QID) | ORAL | Status: DC

## 2021-11-15 MED ORDER — ENOXAPARIN SODIUM 40 MG/0.4ML IJ SOSY: 40.0000 mg | PREFILLED_SYRINGE | INTRAMUSCULAR | Status: DC

## 2021-11-15 MED ORDER — ARFORMOTEROL TARTRATE 15 MCG/2ML IN NEBU: 15.0000 ug | INHALATION_SOLUTION | Freq: Two times a day (BID) | RESPIRATORY_TRACT | Status: DC

## 2021-11-15 MED ORDER — ALBUTEROL SULFATE (2.5 MG/3ML) 0.083% IN NEBU: 2.5000 mg | INHALATION_SOLUTION | RESPIRATORY_TRACT | 12 refills | Status: DC | PRN

## 2021-11-15 MED ORDER — POLYETHYLENE GLYCOL 3350 17 G PO PACK
17.0000 g | PACK | Freq: Every day | ORAL | 0 refills | Status: DC | PRN
Start: 2021-11-15 — End: 2022-08-23

## 2021-11-15 MED ORDER — BIOTENE DRY MOUTH MT LIQD
15.0000 mL | OROMUCOSAL | Status: DC | PRN
Start: 2021-11-15 — End: 2022-08-23

## 2021-11-15 MED ORDER — HYDRALAZINE HCL 20 MG/ML IJ SOLN: 10.0000 mg | Freq: Four times a day (QID) | INTRAMUSCULAR | Status: DC | PRN

## 2021-11-15 NOTE — Progress Notes (Signed)
Pt placed on 60% ATC. Tolerating well at this time.

## 2021-11-15 NOTE — Progress Notes (Signed)
Speech Language Pathology Treatment: Dysphagia;Passy Muir Speaking valve  Patient Details Name: Isaac [name] MRN: [id number] DOB: [birth year] Today's Date: 11/15/2021 Time: 1438-8875 SLP Time Calculation (min) (ACUTE ONLY): 20 min  Assessment / Plan / Recommendation Clinical Impression  Pt was seen for f/u after FEES on previous date. Cuff was deflated upon SLP arrival and PMV was placed, with SpO2 remaining in the high 80s-low 90s across trial and RR around 30. VS were stable regardless of PMV placement. Education was reinforced about supervision for use at this time, although he will benefit from education and training to don/doff PMV for more independent use. He consumed thin liquids and purees from full liquid tray without overt s/s of aspiration, although did assist him in better positioning to increase safety. SLP also encouraged slower pacing for intake, although he did well on FEES when challenged. Per RN, pt is wanting to eat and drink while on the ventilator. Educated pt that for now, since testing was not performed during these conditions, it is unclear if that would be a safe option. Would continue full liquid diet when on TC and when PMV can be applied.   HPI HPI: 65 yo male presented 12/10 with dyspnea, fever, fatigue.  He required intubation in ER 12/10-12; reintubated 12/12; trach 12/15; beginning TC trials.  Positive for COVID 19.  Hospitalized in January 2022, April 2022 and September 2022 for AECOPD and PNA.  He has history of tracheostomy.  He has been on ibrutinib for CLL.  Followed by Dr. Chase Caller in pulmonary office for severe COPD.      SLP Plan  Continue with current plan of care      Recommendations for follow up therapy are one component of a multi-disciplinary discharge planning process, led by the attending physician.  Recommendations may be updated based on patient status, additional functional criteria and insurance authorization.    Recommendations  Diet  recommendations: Thin liquid (full liquid diet) Liquids provided via: Cup;Straw Medication Administration: Whole meds with puree Supervision: Patient able to self feed;Full supervision/cueing for compensatory strategies Compensations: Minimize environmental distractions Postural Changes and/or Swallow Maneuvers: Seated upright 90 degrees      Patient may use Passy-Muir Speech Valve: Intermittently with supervision PMSV Supervision: Full         Oral Care Recommendations: Oral care BID Follow Up Recommendations: SLP at Long-term acute care hospital Assistance recommended at discharge: Frequent or constant Supervision/Assistance SLP Visit Diagnosis: Dysphagia, pharyngeal phase (R13.13) Plan: Continue with current plan of care           Osie Bond., M.A. [facility] Pager [phone] Office [phone]  11/15/2021, 2:45 PM

## 2021-11-15 NOTE — Discharge Summary (Addendum)
Physician Discharge Summary         Patient ID: Isaac [name] MRN: [id number] DOB/AGE: [birth year] 65 y.o.  Admit date: 11/04/2021 Discharge date: 11/15/2021  Discharge Diagnoses:    Acute hypoxic/hypercapnic respiratory failure s/p tracheostomy Acute COPD exacerbation Sepsis, POA Anemia of acute/ chronic illness  Anxiety Hypertension CLL Pressure injury, sacral, POA GERD  Discharge summary   This is a 65 year old male with a known history of CLL, anxiety, HTN, and GERD.  Patient presented to the emergency room on 12/10 in severe respiratory distress.  He was emergently intubated on arrival.  He had not been feeling well according the patient's wife yesterday with generalized fatigue, fevers, increasing shortness of breath/dyspnea on exertion.  Therefore the patient wife performed a at home COVID test which was positive.  His condition continue to worsen over the next 12 hours when the patient's wife finally called 911 and patient was brought to the hospital.  He required bagging by EMS and was intubated on arrival in ER.  Admitted with acute hypoxic respiratory failure secondary to COVID pneumonia.  He was started on decadron and remdesivir and empiric Zithromax and ceftriaxone for CAP coverage.  He was started on precedex and prn fentanyl for sedation.  He was extubated on 12/12 however required re-intubation several hours later due to worsening respiratory distress.  Underwent tracheostomy placement on 12/15 by CCM.  Cortrak placed 12/16.  He at times has had episodes of increased work of breathing, not getting tidal volumes, desaturations, and occasional HR dropping secondary to breath holding related to anxiety.  Has refused OT/ PT and other medical care at times due to anxiety.  Anxiety has remained a large barrier to ongoing rehab and vent liberation.  Weaning trials started on 12/16 and to ATC during day on 12/18.  He still has been needing prn mechanical ventilation for increased  work of breathing, anxiety continues to contribute.  On 12/21, he will be transferred to [place] care at [facility] for further and ongoing care.   12/10 Presents to ED requiring intubation  12/12 Extubated >> required reintubation 12/15 tracheostomy 12/16 PSV did well 12/17 very anxious, refusing TF, refusing TC trials, Bronch with trach ok position, narrowing L mainstem, diuresed 12/18 ATC trials started, more anxious 12/20 PMV trials per SLP, cleared for thin liquids with supervision 12/21 Accepted/ transfer to [facility]   Discharge Plan by Active Problems     Acute hypoxic/hypercapnic respiratory failure s/p tracheostomy Acute COPD exacerbation - continue daily ATC trials and MV prn  - anxiety remains barrier in MV liberation  - rest ventilation PRVC 22/ 580/ 5, FiO2 ranging from 40-60% - ongoing trach care - continue SLP efforts with PMV and swallowing, currently on liquid diet - Continue pulmicort, yupelri, brovana with albuterol as needed - aggressive pulmonary hygiene- push OOB efforts, PT/ OT/ SLP   Sepsis from [place] 19 pneumonia with possible bacterial pneumonia, improved. - completed abx 12/13 - WBC continues to trend down but having fevers since 12/19.  No significant increase in FiO2 needs.  Elevated WBC can be related to CLL - Check CXR, send PCT, trach asp and UA 12/21.  Will hold on blood cultures for now - Continue airborne and contact isolation per ID recs.     CLL - Diagnosed in 2019 - Followed by Dr. Zola Button with oncology - Unable to resume ibrutinib until can swallow (can not crush via tube), per oncology we can hold off for now   Anemia of  critical illness and chronic disease. - trend H/H, stable ~10.5 - Transfuse for Hb < 7   Anxiety. Major barrier to ongoing rehabilitation, likely main cause for episodes of respiratory distress - holding Klonopin 1 mg BID today 12/21 given over sedation.  He is chronically on benzo at home.  May need to  decrease his scheduled oxy IR to prn and lower klonopin dose.   - Continue hydroxyzine 88m TID prn  - Avoid over sedation   HTN. - Blood pressure is well controlled on lopressor 268mq 6hr and prn hydralazine with goal SBP < 170   Stage I pressure injury on buttock, POA - continue wound care and aggressive PT/ OT, frequent turns  - prn oxy ir for pain, continue gabapentin 10055maily   Significant Hospital tests/ studies   PTA > PFT 06/08/14 >> FEV1 0.90 (24%), FEV1% 42, TLC 5.16 (72%), DLCO 26%  CT angio chest 11/06/21 >> Rt greater than Lt patchy b/l GGO, emphysema, atherosclerosis BLE doppler studies 11/06/21 >> neg Limited TTE 11/07/21 >> LVEF 55-60%, no wall motion abnormalities, normal RV, mod dilated LA  Procedures   12/10 ETT > 12/12; 12/12 > 12/15 12/15 trach shiley 6 12/16 R nare cortrak >>  Culture data/antimicrobials   12/10 SARS > positive  Flu > neg 12/10 MRSA PCR > neg 12/10 UC > neg 12/12 trach asp >> rare candida albicans  12/10 Bcx2 >> neg 12/21 trach asp >>  12/9 azithro > 12/13 12/9 ceftriaxone > 12/13  12/10 remdesivir > 12/17 12/10 decadron > 12/19   Consults  N/a    Discharge Exam: BP 106/82    Pulse (!) 110    Temp 99.4 F (37.4 C) (Oral)    Resp (!) 29    Ht 5' 10" (1.778 m)    SpO2 90%    BMI 29.73 kg/m   General:  chronically ill appearing elderly male sitting upright in bed in NAD HEENT: MM pink/moist, wearing glasses, R nare cortrak, midline trach with PMV, good phonation Neuro:  Sleeping but easily awakens, oriented to self/ place/ time, MAE- generally weak CV: rr ir, appears NSR with PACs PULM:  non labored, mildly tachypneic, diffuse exp wheeze, diminished left base on ATC- since placed back on full MV support for increased WOB GI: soft, bs+, primafit  Extremities: warm/dry, no LE edema  Skin: no rashes . Scattered bruising to arms   Labs at discharge   Lab Results  Component Value Date   CREATININE 1.24 11/15/2021   BUN 37  (H) 11/15/2021   NA 135 11/15/2021   K 4.7 11/15/2021   CL 97 (L) 11/15/2021   CO2 31 11/15/2021   Lab Results  Component Value Date   WBC 51.2 (HH) 11/14/2021   HGB 10.1 (L) 11/14/2021   HCT 33.8 (L) 11/14/2021   MCV 87.8 11/14/2021   PLT 248 11/14/2021   Lab Results  Component Value Date   ALT 16 11/10/2021   AST 20 11/10/2021   ALKPHOS 56 11/10/2021   BILITOT 1.0 11/10/2021   Lab Results  Component Value Date   INR 1.1 11/04/2021   INR 1.0 12/20/2020   INR 1.1 12/14/2020    Current radiological studies    No results found.  Disposition:  [facility] [address] [facility]  Discharge disposition: [facility] Def[facility]        Allergies as of 11/15/2021       Reactions  Codeine Itching, Other (See Comments)   "jittery"   Prozac [fluoxetine Hcl] Other (See Comments)   confusion        Medication List     STOP taking these medications    allopurinol 100 MG tablet Commonly known as: ZYLOPRIM   cloNIDine 0.1 mg/24hr patch Commonly known as: CATAPRES - Dosed in mg/24 hr   docusate sodium 100 MG capsule Commonly known as: COLACE   furosemide 40 MG tablet Commonly known as: LASIX   gabapentin 100 MG capsule Commonly known as: NEURONTIN Replaced by: gabapentin 250 MG/5ML solution   guaiFENesin 600 MG 12 hr tablet Commonly known as: MUCINEX   hydrOXYzine 25 MG capsule Commonly known as: VISTARIL   Imbruvica 420 MG Tabs Generic drug: ibrutinib   ipratropium-albuterol 0.5-2.5 (3) MG/3ML Soln Commonly known as: DUONEB   loratadine 10 MG tablet Commonly known as: CLARITIN   LORazepam 0.5 MG tablet Commonly known as: ATIVAN   montelukast 10 MG tablet Commonly known as: SINGULAIR   pantoprazole 40 MG tablet Commonly known as: PROTONIX Replaced by: pantoprazole sodium 40 mg   predniSONE 20 MG tablet Commonly known as: DELTASONE   predniSONE 5 MG  tablet Commonly known as: DELTASONE   sodium chloride 0.65 % Soln nasal spray Commonly known as: OCEAN   VISINE OP       TAKE these medications    albuterol (2.5 MG/3ML) 0.083% nebulizer solution Commonly known as: PROVENTIL Take 3 mLs (2.5 mg total) by nebulization every 4 (four) hours as needed for shortness of breath or wheezing. What changed: Another medication with the same name was removed. Continue taking this medication, and follow the directions you see here.   antiseptic oral rinse Liqd 15 mLs by Mouth Rinse route as needed for dry mouth.   arformoterol 15 MCG/2ML Nebu Commonly known as: BROVANA Take 2 mLs (15 mcg total) by nebulization 2 (two) times daily.   budesonide 0.5 MG/2ML nebulizer solution Commonly known as: PULMICORT Take 2 mLs (0.5 mg total) by nebulization 2 (two) times daily. What changed: See the new instructions.   chlorhexidine gluconate (MEDLINE KIT) 0.12 % solution Commonly known as: PERIDEX 15 mLs by Mouth Rinse route 2 (two) times daily.   enoxaparin 40 MG/0.4ML injection Commonly known as: LOVENOX Inject 0.4 mLs (40 mg total) into the skin daily.   feeding supplement (PROSource TF) liquid Place 45 mLs into feeding tube 4 (four) times daily. What changed:  how much to take how to take this when to take this additional instructions   feeding supplement (OSMOLITE 1.2 CAL) Liqd Place 1,000 mLs into feeding tube continuous. What changed: You were already taking a medication with the same name, and this prescription was added. Make sure you understand how and when to take each.   feeding supplement Liqd Take 237 mLs by mouth 3 (three) times daily between meals. What changed: You were already taking a medication with the same name, and this prescription was added. Make sure you understand how and when to take each.   free water Soln Place 200 mLs into feeding tube every 8 (eight) hours.   gabapentin 250 MG/5ML solution Commonly known  as: NEURONTIN Place 2 mLs (100 mg total) into feeding tube daily. Start taking on: November 16, 2021 Replaces: gabapentin 100 MG capsule   hydrALAZINE 20 MG/ML injection Commonly known as: APRESOLINE Inject 0.5 mLs (10 mg total) into the vein every 6 (six) hours as needed (SBP >170).   hydrOXYzine 10 MG/5ML syrup Commonly known  as: ATARAX Place 25 mLs (50 mg total) into feeding tube 3 (three) times daily as needed for itching or anxiety.   metoprolol tartrate 25 MG tablet Commonly known as: LOPRESSOR Place 1 tablet (25 mg total) into feeding tube every 6 (six) hours. What changed:  how to take this when to take this   Oxycodone HCl 10 MG Tabs Place 1 tablet (10 mg total) into feeding tube every 6 (six) hours as needed for severe pain.   pantoprazole sodium 40 mg Commonly known as: PROTONIX Place 40 mg into feeding tube at bedtime. Replaces: pantoprazole 40 MG tablet   polyethylene glycol 17 g packet Commonly known as: MIRALAX / GLYCOLAX Place 17 g into feeding tube daily as needed for mild constipation. What changed: reasons to take this   revefenacin 175 MCG/3ML nebulizer solution Commonly known as: YUPELRI Take 3 mLs (175 mcg total) by nebulization daily. Start taking on: November 16, 2021         Follow-up appointment   N/a   Discharge Condition:    stable   Signed:   Kennieth Rad, ACNP [facility] 11/15/2021, 5:26 PM  See Amion for pager If no response to pager, please call PCCM consult pager After 7:00 pm call [place]

## 2021-11-16 ENCOUNTER — Other Ambulatory Visit (HOSPITAL_COMMUNITY): Payer: Self-pay

## 2021-11-16 DIAGNOSIS — J449 Chronic obstructive pulmonary disease, unspecified: Secondary | ICD-10-CM

## 2021-11-16 DIAGNOSIS — R6520 Severe sepsis without septic shock: Secondary | ICD-10-CM

## 2021-11-16 DIAGNOSIS — J189 Pneumonia, unspecified organism: Secondary | ICD-10-CM

## 2021-11-16 DIAGNOSIS — J9621 Acute and chronic respiratory failure with hypoxia: Secondary | ICD-10-CM

## 2021-11-16 DIAGNOSIS — U071 COVID-19: Secondary | ICD-10-CM

## 2021-11-16 DIAGNOSIS — A419 Sepsis, unspecified organism: Secondary | ICD-10-CM

## 2021-11-16 LAB — BASIC METABOLIC PANEL
Anion gap: 7 (ref 5–15)
BUN: 37 mg/dL — ABNORMAL HIGH (ref 8–23)
CO2: 30 mmol/L (ref 22–32)
Calcium: 7.2 mg/dL — ABNORMAL LOW (ref 8.9–10.3)
Chloride: 95 mmol/L — ABNORMAL LOW (ref 98–111)
Creatinine, Ser: 1.56 mg/dL — ABNORMAL HIGH (ref 0.61–1.24)
GFR, Estimated: 49 mL/min — ABNORMAL LOW (ref >60)
Glucose, Bld: 201 mg/dL — ABNORMAL HIGH (ref 70–99)
Potassium: 5.2 mmol/L — ABNORMAL HIGH (ref 3.5–5.1)
Sodium: 132 mmol/L — ABNORMAL LOW (ref 135–145)

## 2021-11-16 LAB — CBC
HCT: 30.9 % — ABNORMAL LOW (ref 39.0–52.0)
Hemoglobin: 9.2 g/dL — ABNORMAL LOW (ref 13.0–17.0)
MCH: 25.8 pg — ABNORMAL LOW (ref 26.0–34.0)
MCHC: 29.8 g/dL — ABNORMAL LOW (ref 30.0–36.0)
MCV: 86.6 fL (ref 80.0–100.0)
Platelets: 232 10*3/uL (ref 150–400)
RBC: 3.57 MIL/uL — ABNORMAL LOW (ref 4.22–5.81)
RDW: 14.9 % (ref 11.5–15.5)
WBC: 39.2 10*3/uL — ABNORMAL HIGH (ref 4.0–10.5)
nRBC: 0.1 % (ref 0.0–0.2)

## 2021-11-16 NOTE — Consult Note (Signed)
Pulmonary Critical Care Medicine [place]  PULMONARY SERVICE  Date of Service: 11/16/2021  PULMONARY CRITICAL CARE CONSULT   Isaac [name]  OTL:[id number]  DOB: 07-17-1956   DOA: 11/15/2021  Referring Physician: Satira Sark, MD  HPI: Isaac [name] is a 65 y.o. male seen for follow up of Acute on Chronic Respiratory Failure.  Patient has multitude of medical problems including COPD GERD hyperlipidemia hypertension insomnia smoker who presented to the hospital with acute respiratory distress.  Patient was apparently intubated emergently on arrival to the ED had been having some increasing shortness of breath going on for couple days.  The patient had a home COVID test done which was apparently positive.  Patient was started on Decadron remdesivir and empiric azithromycin along with ceftriaxone for possibility of community-acquired pneumonia.  The patient was not able to wean because the patient was extubated on December 12 but failed and subsequently had to be reintubated.  Patient underwent a tracheostomy and transferred now to our facility for further management and weaning.  Review of Systems:  ROS performed and is unremarkable other than noted above.  Past Medical History:  Diagnosis Date   Acute on chronic respiratory failure with hypoxia (HCC)    Anxiety    Chronic lymphocytic leukemia in remission (HCC)    COPD (chronic obstructive pulmonary disease) (HCC)    COPD, severe (HCC)    GERD (gastroesophageal reflux disease)    Hematuria    Hyperlipidemia    Hypertension    Insomnia    Left lower lobe pneumonia    Nephrolithiasis    Tobacco dependence     Past Surgical History:  Procedure Laterality Date   LITHOTRIPSY      Social History:    reports that he quit smoking about 12 years ago. His smoking use included cigarettes. He has a 35.00 pack-year smoking history. He has never used smokeless tobacco. No history on file for alcohol use and  drug use.  Family History: Non-Contributory to the present illness  Allergies  Allergen Reactions   Codeine Itching and Other (See Comments)    "jittery"   Prozac [Fluoxetine Hcl] Other (See Comments)    confusion    Medications: Reviewed on Rounds  Physical Exam:  Vitals: Temperature 98.9 pulse 97 respiratory is 27 blood pressure is 130/66 saturations 97%  Ventilator Settings pressure support FiO2 40% pressure 12/5  General: Comfortable at this time Eyes: Grossly normal lids, irises & conjunctiva ENT: grossly tongue is normal Neck: no obvious mass Cardiovascular: S1-S2 normal no gallop or rub Respiratory: Scattered rhonchi expansion is equal Abdomen: Soft and nontender Skin: no rash seen on limited exam Musculoskeletal: not rigid Psychiatric:unable to assess Neurologic: no seizure no involuntary movements         Labs on Admission:  Basic Metabolic Panel: Recent Labs  Lab 11/10/21 0928 11/11/21 0719 11/12/21 0908 11/13/21 0150 11/14/21 0131 11/15/21 0320 11/16/21 0415  NA 144 143 139 138 140 135 132*  K 4.4 4.7 4.4 4.8 4.6 4.7 5.2*  CL 105 102 102 100 97* 97* 95*  CO2 33* 30 31 32 36* 31 30  GLUCOSE 93 81 146* 132* 94 130* 201*  BUN 29* 28* 36* 37* 38* 37* 37*  CREATININE 0.84 0.92 0.96 0.85 1.01 1.24 1.56*  CALCIUM 8.4* 8.7* 8.1* 7.9* 7.8* 7.4* 7.2*  MG 2.5* 2.4  --   --  2.5*  --   --   PHOS  --   --   --   --  3.5  --   --     Recent Labs  Lab 11/15/21 2200  PHART 7.412  PCO2ART 50.8*  PO2ART 63.2*  HCO3 31.1*  O2SAT 90.0    Liver Function Tests: Recent Labs  Lab 11/10/21 0928  AST 20  ALT 16  ALKPHOS 56  BILITOT 1.0  PROT 5.0*  ALBUMIN 3.0*   No results for input(s): LIPASE, AMYLASE in the last 168 hours. No results for input(s): AMMONIA in the last 168 hours.  CBC: Recent Labs  Lab 11/10/21 0928 11/11/21 0719 11/14/21 0131 11/16/21 0415  WBC 68.5* 71.5* 51.2* 39.2*  NEUTROABS 8.5* 9.0* 7.6  --   HGB 10.5* 10.2* 10.1*  9.2*  HCT 36.5* 36.1* 33.8* 30.9*  MCV 90.6 90.0 87.8 86.6  PLT 246 258 248 232    Cardiac Enzymes: No results for input(s): CKTOTAL, CKMB, CKMBINDEX, TROPONINI in the last 168 hours.  BNP (last 3 results) Recent Labs    12/08/20 0100 03/04/21 1555 08/21/21 0504  BNP 146.1* 93.2 142.9*    ProBNP (last 3 results) No results for input(s): PROBNP in the last 8760 hours.   Radiological Exams on Admission: DG Chest 1 View  Result Date: 11/12/2021 CLINICAL DATA:  Hypoxia.  Follow-up exam. EXAM: CHEST  1 VIEW COMPARISON:  11/10/2021 and older studies. FINDINGS: Left lung base opacity obscures the hemidiaphragm. Small stable focus of opacity in the left upper lung. Suspect small left pleural effusion. No convincing right pleural effusion. No pneumothorax. Stable well-positioned tracheostomy tube. New enteric tube passes below the diaphragm. IMPRESSION: 1. Left lung base opacity consistent with atelectasis and/or pneumonia with a small effusion. This is more apparent than it was on the previous exam. 2. No other evidence of acute cardiopulmonary disease. Electronically Signed   By: Lajean Manes M.D.   On: 11/12/2021 13:04   DG Chest Port 1 View  Result Date: 11/15/2021 CLINICAL DATA:  Respiratory failure EXAM: PORTABLE CHEST 1 VIEW COMPARISON:  11/12/2021 FINDINGS: Cardiac shadow is stable. Tracheostomy tube and feeding catheter are again noted. Persistent left retrocardiac opacity is seen. Increasing right basilar airspace opacity is noted as well. IMPRESSION: Bibasilar airspace opacities increased on the right from the prior exam. Electronically Signed   By: Inez Catalina M.D.   On: 11/15/2021 21:24   DG Abd Portable 1V  Result Date: 11/15/2021 CLINICAL DATA:  Check gastric catheter placement EXAM: PORTABLE ABDOMEN - 1 VIEW COMPARISON:  11/10/2021 FINDINGS: Feeding catheter is noted in the distal stomach stable in appearance from the prior exam. IMPRESSION: Feeding catheter in the distal  stomach. Electronically Signed   By: Inez Catalina M.D.   On: 11/15/2021 21:19    Assessment/Plan Active Problems:   COPD, severe (HCC)   Acute on chronic respiratory failure with hypoxia (HCC)   HCAP (healthcare-associated pneumonia)   COVID-19   Severe sepsis (HCC)   Acute on chronic respiratory failure with hypoxia wean on the pressure support today apparently he was doing T collar at the other facility we will go ahead and follow the protocol and advance as tolerated. COVID-19 virus infection in recovery patient has been treated with remdesivir steroids as well as azithromycin. Healthcare associated pneumonia patient likely had secondary infection from the COVID-19 with bacterial pneumonia has been treated with antibiotics Severe sepsis now resolved hemodynamics are stable we will continue to monitor along closely. COPD medical management nebulizers as needed  I have personally seen and evaluated the patient, evaluated laboratory and imaging results, formulated the assessment and  plan and placed orders. The Patient requires high complexity decision making with multiple systems involvement.  Case was discussed on Rounds with the Respiratory Therapy Director and the Respiratory staff Time Spent 59minutes  Dorothea Yow A Janel Beane, MD [facility] Pulmonary Critical Care Medicine Sleep Medicine

## 2021-11-17 ENCOUNTER — Other Ambulatory Visit (HOSPITAL_COMMUNITY): Payer: Self-pay

## 2021-11-17 LAB — COMPREHENSIVE METABOLIC PANEL
ALT: 26 U/L (ref 0–44)
AST: 53 U/L — ABNORMAL HIGH (ref 15–41)
Albumin: 1.6 g/dL — ABNORMAL LOW (ref 3.5–5.0)
Alkaline Phosphatase: 48 U/L (ref 38–126)
Anion gap: 5 (ref 5–15)
BUN: 47 mg/dL — ABNORMAL HIGH (ref 8–23)
CO2: 35 mmol/L — ABNORMAL HIGH (ref 22–32)
Calcium: 7.4 mg/dL — ABNORMAL LOW (ref 8.9–10.3)
Chloride: 96 mmol/L — ABNORMAL LOW (ref 98–111)
Creatinine, Ser: 1.51 mg/dL — ABNORMAL HIGH (ref 0.61–1.24)
GFR, Estimated: 51 mL/min — ABNORMAL LOW (ref >60)
Glucose, Bld: 141 mg/dL — ABNORMAL HIGH (ref 70–99)
Potassium: 4.3 mmol/L (ref 3.5–5.1)
Sodium: 136 mmol/L (ref 135–145)
Total Bilirubin: 0.6 mg/dL (ref 0.3–1.2)
Total Protein: 4.3 g/dL — ABNORMAL LOW (ref 6.5–8.1)

## 2021-11-17 LAB — CBC
HCT: 32.5 % — ABNORMAL LOW (ref 39.0–52.0)
Hemoglobin: 9.7 g/dL — ABNORMAL LOW (ref 13.0–17.0)
MCH: 26.1 pg (ref 26.0–34.0)
MCHC: 29.8 g/dL — ABNORMAL LOW (ref 30.0–36.0)
MCV: 87.6 fL (ref 80.0–100.0)
Platelets: 192 10*3/uL (ref 150–400)
RBC: 3.71 MIL/uL — ABNORMAL LOW (ref 4.22–5.81)
RDW: 14.9 % (ref 11.5–15.5)
WBC: 23 10*3/uL — ABNORMAL HIGH (ref 4.0–10.5)
nRBC: 0 % (ref 0.0–0.2)

## 2021-11-17 LAB — PROCALCITONIN: Procalcitonin: <0.1 ng/mL

## 2021-11-17 NOTE — Progress Notes (Signed)
Pulmonary Critical Care Medicine [place]   PULMONARY CRITICAL CARE SERVICE  PROGRESS NOTE     Isaac [name]  HYW:[id number]  DOB: 1956/10/07   DOA: 11/15/2021  Referring Physician: Satira Sark, MD  HPI: Isaac [name] is a 65 y.o. male being followed for ventilator/airway/oxygen weaning Acute on Chronic Respiratory Failure.  Patient is on pressure support mode has been on a pressure of 12/5  Medications: Reviewed on Rounds  Physical Exam:  Vitals: Temperature is 97.6 pulse 90 respiratory 23 blood pressure is 137/76 saturations 94%  Ventilator Settings pressure support 12/5  General: Comfortable at this time Neck: supple Cardiovascular: no malignant arrhythmias Respiratory: No rhonchi no rales noted at this time Skin: no rash seen on limited exam Musculoskeletal: No gross abnormality Psychiatric:unable to assess Neurologic:no involuntary movements         Lab Data:   Basic Metabolic Panel: Recent Labs  Lab 11/10/21 0928 11/11/21 0719 11/12/21 0908 11/13/21 0150 11/14/21 0131 11/15/21 0320 11/16/21 0415 11/17/21 0405  NA 144 143   < > 138 140 135 132* 136  K 4.4 4.7   < > 4.8 4.6 4.7 5.2* 4.3  CL 105 102   < > 100 97* 97* 95* 96*  CO2 33* 30   < > 32 36* 31 30 35*  GLUCOSE 93 81   < > 132* 94 130* 201* 141*  BUN 29* 28*   < > 37* 38* 37* 37* 47*  CREATININE 0.84 0.92   < > 0.85 1.01 1.24 1.56* 1.51*  CALCIUM 8.4* 8.7*   < > 7.9* 7.8* 7.4* 7.2* 7.4*  MG 2.5* 2.4  --   --  2.5*  --   --   --   PHOS  --   --   --   --  3.5  --   --   --    < > = values in this interval not displayed.    ABG: Recent Labs  Lab 11/15/21 2200  PHART 7.412  PCO2ART 50.8*  PO2ART 63.2*  HCO3 31.1*  O2SAT 90.0    Liver Function Tests: Recent Labs  Lab 11/10/21 0928 11/17/21 0405  AST 20 53*  ALT 16 26  ALKPHOS 56 48  BILITOT 1.0 0.6  PROT 5.0* 4.3*  ALBUMIN 3.0* 1.6*   No results for input(s): LIPASE, AMYLASE in the last 168  hours. No results for input(s): AMMONIA in the last 168 hours.  CBC: Recent Labs  Lab 11/10/21 0928 11/11/21 0719 11/14/21 0131 11/16/21 0415 11/17/21 0405  WBC 68.5* 71.5* 51.2* 39.2* 23.0*  NEUTROABS 8.5* 9.0* 7.6  --   --   HGB 10.5* 10.2* 10.1* 9.2* 9.7*  HCT 36.5* 36.1* 33.8* 30.9* 32.5*  MCV 90.6 90.0 87.8 86.6 87.6  PLT 246 258 248 232 192    Cardiac Enzymes: No results for input(s): CKTOTAL, CKMB, CKMBINDEX, TROPONINI in the last 168 hours.  BNP (last 3 results) Recent Labs    12/08/20 0100 03/04/21 1555 08/21/21 0504  BNP 146.1* 93.2 142.9*    ProBNP (last 3 results) No results for input(s): PROBNP in the last 8760 hours.  Radiological Exams: DG Chest Port 1 View  Result Date: 11/15/2021 CLINICAL DATA:  Respiratory failure EXAM: PORTABLE CHEST 1 VIEW COMPARISON:  11/12/2021 FINDINGS: Cardiac shadow is stable. Tracheostomy tube and feeding catheter are again noted. Persistent left retrocardiac opacity is seen. Increasing right basilar airspace opacity is noted as well. IMPRESSION: Bibasilar airspace opacities increased on the right  from the prior exam. Electronically Signed   By: Inez Catalina M.D.   On: 11/15/2021 21:24   DG Abd Portable 1V  Result Date: 11/15/2021 CLINICAL DATA:  Check gastric catheter placement EXAM: PORTABLE ABDOMEN - 1 VIEW COMPARISON:  11/10/2021 FINDINGS: Feeding catheter is noted in the distal stomach stable in appearance from the prior exam. IMPRESSION: Feeding catheter in the distal stomach. Electronically Signed   By: Inez Catalina M.D.   On: 11/15/2021 21:19    Assessment/Plan Active Problems:   COPD, severe (HCC)   Acute on chronic respiratory failure with hypoxia (HCC)   HCAP (healthcare-associated pneumonia)   COVID-19   Severe sepsis (HCC)   Acute on chronic respiratory failure with hypoxia we will continue to wean patient's goal is 4 hours on pressure support Severe COPD medical management nebulizers as needed Healthcare  associated pneumonia has been treated COVID-19 virus infection in recovery Severe sepsis resolved   I have personally seen and evaluated the patient, evaluated laboratory and imaging results, formulated the assessment and plan and placed orders. The Patient requires high complexity decision making with multiple systems involvement.  Rounds were done with the Respiratory Therapy Director and Staff therapists and discussed with nursing staff also.  Allyne Gee, MD [facility] Pulmonary Critical Care Medicine Sleep Medicine

## 2021-11-18 LAB — BLOOD CULTURE ID PANEL (REFLEXED) - BCID2

## 2021-11-18 LAB — CULTURE, RESPIRATORY W GRAM STAIN

## 2021-11-18 NOTE — Progress Notes (Signed)
Pulmonary Critical Care Medicine [place]   PULMONARY CRITICAL CARE SERVICE  PROGRESS NOTE     Isaac [name]  CBU:[id number]  DOB: 1956-03-27   DOA: 11/15/2021  Referring Physician: Satira Sark, MD  HPI: Isaac [name] is a 65 y.o. male being followed for ventilator/airway/oxygen weaning Acute on Chronic Respiratory Failure.  Patient had low-grade fever this morning temperature was 100.1  Medications: Reviewed on Rounds  Physical Exam:  Vitals: Temperature 100.1 pulse 95 respiratory 22 blood pressure is 110/61 saturations 97%  Ventilator Settings on pressure support FiO2 40% pressure 12/5  General: Comfortable at this time Neck: supple Cardiovascular: no malignant arrhythmias Respiratory: No rhonchi no rales are noted at this time Skin: no rash seen on limited exam Musculoskeletal: No gross abnormality Psychiatric:unable to assess Neurologic:no involuntary movements         Lab Data:   Basic Metabolic Panel: Recent Labs  Lab 11/13/21 0150 11/14/21 0131 11/15/21 0320 11/16/21 0415 11/17/21 0405  NA 138 140 135 132* 136  K 4.8 4.6 4.7 5.2* 4.3  CL 100 97* 97* 95* 96*  CO2 32 36* 31 30 35*  GLUCOSE 132* 94 130* 201* 141*  BUN 37* 38* 37* 37* 47*  CREATININE 0.85 1.01 1.24 1.56* 1.51*  CALCIUM 7.9* 7.8* 7.4* 7.2* 7.4*  MG  --  2.5*  --   --   --   PHOS  --  3.5  --   --   --     ABG: Recent Labs  Lab 11/15/21 2200  PHART 7.412  PCO2ART 50.8*  PO2ART 63.2*  HCO3 31.1*  O2SAT 90.0    Liver Function Tests: Recent Labs  Lab 11/17/21 0405  AST 53*  ALT 26  ALKPHOS 48  BILITOT 0.6  PROT 4.3*  ALBUMIN 1.6*   No results for input(s): LIPASE, AMYLASE in the last 168 hours. No results for input(s): AMMONIA in the last 168 hours.  CBC: Recent Labs  Lab 11/14/21 0131 11/16/21 0415 11/17/21 0405  WBC 51.2* 39.2* 23.0*  NEUTROABS 7.6  --   --   HGB 10.1* 9.2* 9.7*  HCT 33.8* 30.9* 32.5*  MCV 87.8 86.6 87.6  PLT  248 232 192    Cardiac Enzymes: No results for input(s): CKTOTAL, CKMB, CKMBINDEX, TROPONINI in the last 168 hours.  BNP (last 3 results) Recent Labs    12/08/20 0100 03/04/21 1555 08/21/21 0504  BNP 146.1* 93.2 142.9*    ProBNP (last 3 results) No results for input(s): PROBNP in the last 8760 hours.  Radiological Exams: No results found.  Assessment/Plan Active Problems:   COPD, severe (HCC)   Acute on chronic respiratory failure with hypoxia (HCC)   HCAP (healthcare-associated pneumonia)   COVID-19   Severe sepsis (HCC)   Acute on chronic respiratory failure with hypoxia plan is for 8-hour wean on pressure support continue to advance it as tolerated we will continue to follow along closely. Severe COPD medical management continue to follow along closely. Healthcare associated pneumonia treated COVID-19 virus infection severe disease slow to improve Severe sepsis resolving   I have personally seen and evaluated the patient, evaluated laboratory and imaging results, formulated the assessment and plan and placed orders. The Patient requires high complexity decision making with multiple systems involvement.  Rounds were done with the Respiratory Therapy Director and Staff therapists and discussed with nursing staff also.  Allyne Gee, MD [facility] Pulmonary Critical Care Medicine Sleep Medicine

## 2021-11-19 NOTE — Progress Notes (Signed)
Pulmonary Critical Care Medicine [place]   PULMONARY CRITICAL CARE SERVICE  PROGRESS NOTE     Isaac [name]  [id number]  DOB: May 14, 1956   DOA: 11/15/2021  Referring Physician: Satira Sark, MD  HPI: Isaac [name] is a 65 y.o. male being followed for ventilator/airway/oxygen weaning Acute on Chronic Respiratory Failure.  Patient is on pressure support mode has been on 40% oxygen supposed to do a 12-hour wean  Medications: Reviewed on Rounds  Physical Exam:  Vitals: Temperature is 99.6 pulse 99 respiratory is 25 blood pressure is 115/67 saturations 98%  Ventilator Settings on pressure support FiO2 is 40% pressure 12/5  General: Comfortable at this time Neck: supple Cardiovascular: no malignant arrhythmias Respiratory: Scattered rhonchi expansion is equal Skin: no rash seen on limited exam Musculoskeletal: No gross abnormality Psychiatric:unable to assess Neurologic:no involuntary movements         Lab Data:   Basic Metabolic Panel: Recent Labs  Lab 11/13/21 0150 11/14/21 0131 11/15/21 0320 11/16/21 0415 11/17/21 0405  NA 138 140 135 132* 136  K 4.8 4.6 4.7 5.2* 4.3  CL 100 97* 97* 95* 96*  CO2 32 36* 31 30 35*  GLUCOSE 132* 94 130* 201* 141*  BUN 37* 38* 37* 37* 47*  CREATININE 0.85 1.01 1.24 1.56* 1.51*  CALCIUM 7.9* 7.8* 7.4* 7.2* 7.4*  MG  --  2.5*  --   --   --   PHOS  --  3.5  --   --   --     ABG: Recent Labs  Lab 11/15/21 2200  PHART 7.412  PCO2ART 50.8*  PO2ART 63.2*  HCO3 31.1*  O2SAT 90.0    Liver Function Tests: Recent Labs  Lab 11/17/21 0405  AST 53*  ALT 26  ALKPHOS 48  BILITOT 0.6  PROT 4.3*  ALBUMIN 1.6*   No results for input(s): LIPASE, AMYLASE in the last 168 hours. No results for input(s): AMMONIA in the last 168 hours.  CBC: Recent Labs  Lab 11/14/21 0131 11/16/21 0415 11/17/21 0405  WBC 51.2* 39.2* 23.0*  NEUTROABS 7.6  --   --   HGB 10.1* 9.2* 9.7*  HCT 33.8* 30.9*  32.5*  MCV 87.8 86.6 87.6  PLT 248 232 192    Cardiac Enzymes: No results for input(s): CKTOTAL, CKMB, CKMBINDEX, TROPONINI in the last 168 hours.  BNP (last 3 results) Recent Labs    12/08/20 0100 03/04/21 1555 08/21/21 0504  BNP 146.1* 93.2 142.9*    ProBNP (last 3 results) No results for input(s): PROBNP in the last 8760 hours.  Radiological Exams: No results found.  Assessment/Plan Active Problems:   COPD, severe (HCC)   Acute on chronic respiratory failure with hypoxia (HCC)   HCAP (healthcare-associated pneumonia)   COVID-19   Severe sepsis (HCC)   Acute on chronic respiratory failure with hypoxia patient is weaning on protocol 12 hours goal today on the pressure support Severe COPD medical management we will continue to follow along closely. Healthcare associated pneumonia has been treated with antibiotics slow improvement COVID-19 virus infection in recovery Severe sepsis resolved hemodynamics are stable   I have personally seen and evaluated the patient, evaluated laboratory and imaging results, formulated the assessment and plan and placed orders. The Patient requires high complexity decision making with multiple systems involvement.  Rounds were done with the Respiratory Therapy Director and Staff therapists and discussed with nursing staff also.  Allyne Gee, MD [facility] Pulmonary Critical Care Medicine Sleep Medicine

## 2021-11-20 LAB — CBC
HCT: 27.8 % — ABNORMAL LOW (ref 39.0–52.0)
Hemoglobin: 8.3 g/dL — ABNORMAL LOW (ref 13.0–17.0)
MCH: 26.2 pg (ref 26.0–34.0)
MCHC: 29.9 g/dL — ABNORMAL LOW (ref 30.0–36.0)
MCV: 87.7 fL (ref 80.0–100.0)
Platelets: 162 10*3/uL (ref 150–400)
RBC: 3.17 MIL/uL — ABNORMAL LOW (ref 4.22–5.81)
RDW: 14.6 % (ref 11.5–15.5)
WBC: 14.9 10*3/uL — ABNORMAL HIGH (ref 4.0–10.5)
nRBC: 0 % (ref 0.0–0.2)

## 2021-11-20 LAB — CULTURE, BLOOD (ROUTINE X 2): Special Requests: ADEQUATE

## 2021-11-20 NOTE — Progress Notes (Signed)
Pulmonary Critical Care Medicine [place]   PULMONARY CRITICAL CARE SERVICE  PROGRESS NOTE     Isaac [name]  LFY:[id number]  DOB: [birth year]   DOA: 11/15/2021  Referring Physician: Satira Sark, MD  HPI: Isaac [name] is a 65 y.o. male being followed for ventilator/airway/oxygen weaning Acute on Chronic Respiratory Failure.  Patient is on pressure support wean supposed to be doing 16 hours  Medications: Reviewed on Rounds  Physical Exam:  Vitals: Temperature is 98.5 pulse 66 respiratory is 22 blood pressure is 118/53 saturations 99%  Ventilator Settings on pressure support FiO2 is 40% pressure 12/5  General: Comfortable at this time Neck: supple Cardiovascular: no malignant arrhythmias Respiratory: Scattered rhonchi very coarse breath sounds Skin: no rash seen on limited exam Musculoskeletal: No gross abnormality Psychiatric:unable to assess Neurologic:no involuntary movements         Lab Data:   Basic Metabolic Panel: Recent Labs  Lab 11/14/21 0131 11/15/21 0320 11/16/21 0415 11/17/21 0405  NA 140 135 132* 136  K 4.6 4.7 5.2* 4.3  CL 97* 97* 95* 96*  CO2 36* 31 30 35*  GLUCOSE 94 130* 201* 141*  BUN 38* 37* 37* 47*  CREATININE 1.01 1.24 1.56* 1.51*  CALCIUM 7.8* 7.4* 7.2* 7.4*  MG 2.5*  --   --   --   PHOS 3.5  --   --   --     ABG: Recent Labs  Lab 11/15/21 2200  PHART 7.412  PCO2ART 50.8*  PO2ART 63.2*  HCO3 31.1*  O2SAT 90.0    Liver Function Tests: Recent Labs  Lab 11/17/21 0405  AST 53*  ALT 26  ALKPHOS 48  BILITOT 0.6  PROT 4.3*  ALBUMIN 1.6*   No results for input(s): LIPASE, AMYLASE in the last 168 hours. No results for input(s): AMMONIA in the last 168 hours.  CBC: Recent Labs  Lab 11/14/21 0131 11/16/21 0415 11/17/21 0405 11/20/21 0650  WBC 51.2* 39.2* 23.0* 14.9*  NEUTROABS 7.6  --   --   --   HGB 10.1* 9.2* 9.7* 8.3*  HCT 33.8* 30.9* 32.5* 27.8*  MCV 87.8 86.6 87.6 87.7  PLT 248  232 192 162    Cardiac Enzymes: No results for input(s): CKTOTAL, CKMB, CKMBINDEX, TROPONINI in the last 168 hours.  BNP (last 3 results) Recent Labs    12/08/20 0100 03/04/21 1555 08/21/21 0504  BNP 146.1* 93.2 142.9*    ProBNP (last 3 results) No results for input(s): PROBNP in the last 8760 hours.  Radiological Exams: No results found.  Assessment/Plan Active Problems:   COPD, severe (HCC)   Acute on chronic respiratory failure with hypoxia (HCC)   HCAP (healthcare-associated pneumonia)   COVID-19   Severe sepsis (HCC)   Acute on chronic respiratory failure with hypoxia plan is going to be to wean on pressure support titrate oxygen as tolerated continue pulmonary toilet Severe COPD medical management nebulizers as needed Healthcare associated pneumonia has been treated with antibiotics COVID-19 virus infection in recovery Severe sepsis resolved hemodynamics are stable   I have personally seen and evaluated the patient, evaluated laboratory and imaging results, formulated the assessment and plan and placed orders. The Patient requires high complexity decision making with multiple systems involvement.  Rounds were done with the Respiratory Therapy Director and Staff therapists and discussed with nursing staff also.  Allyne Gee, MD [facility] Pulmonary Critical Care Medicine Sleep Medicine

## 2021-11-21 ENCOUNTER — Other Ambulatory Visit: Payer: Medicare Other

## 2021-11-21 DIAGNOSIS — J9621 Acute and chronic respiratory failure with hypoxia: Secondary | ICD-10-CM | POA: Diagnosis not present

## 2021-11-21 DIAGNOSIS — J189 Pneumonia, unspecified organism: Secondary | ICD-10-CM | POA: Diagnosis not present

## 2021-11-21 DIAGNOSIS — J449 Chronic obstructive pulmonary disease, unspecified: Secondary | ICD-10-CM | POA: Diagnosis not present

## 2021-11-21 DIAGNOSIS — U071 COVID-19: Secondary | ICD-10-CM | POA: Diagnosis not present

## 2021-11-21 DIAGNOSIS — A419 Sepsis, unspecified organism: Secondary | ICD-10-CM | POA: Diagnosis not present

## 2021-11-21 DIAGNOSIS — R6520 Severe sepsis without septic shock: Secondary | ICD-10-CM | POA: Diagnosis not present

## 2021-11-21 LAB — CULTURE, BLOOD (ROUTINE X 2)
Culture: NO GROWTH
Special Requests: ADEQUATE

## 2021-11-21 NOTE — Progress Notes (Signed)
Pulmonary Critical Care Medicine [place]   PULMONARY CRITICAL CARE SERVICE  PROGRESS NOTE     Isaac [name]  [id number]:[id number]  DOB: 1956/09/07   DOA: 11/15/2021  Referring Physician: Satira Sark, MD  HPI: Isaac [name] is a 65 y.o. male being followed for ventilator/airway/oxygen weaning Acute on Chronic Respiratory Failure.  Patient is on pressure support wean 12/5 and the goal is for 20 hours but he is also supposed to do T collar is for 2 hours  Medications: Reviewed on Rounds  Physical Exam:  Vitals: Temperature 97.1 pulse 92 respiratory 25 blood pressure 103/63 saturations 97%  Ventilator Settings on pressure support FiO2 35% pressure 12/5  General: Comfortable at this time Neck: supple Cardiovascular: no malignant arrhythmias Respiratory: Coarse rhonchi expansion is equal Skin: no rash seen on limited exam Musculoskeletal: No gross abnormality Psychiatric:unable to assess Neurologic:no involuntary movements         Lab Data:   Basic Metabolic Panel: Recent Labs  Lab 11/15/21 0320 11/16/21 0415 11/17/21 0405  NA 135 132* 136  K 4.7 5.2* 4.3  CL 97* 95* 96*  CO2 31 30 35*  GLUCOSE 130* 201* 141*  BUN 37* 37* 47*  CREATININE 1.24 1.56* 1.51*  CALCIUM 7.4* 7.2* 7.4*    ABG: Recent Labs  Lab 11/15/21 2200  PHART 7.412  PCO2ART 50.8*  PO2ART 63.2*  HCO3 31.1*  O2SAT 90.0    Liver Function Tests: Recent Labs  Lab 11/17/21 0405  AST 53*  ALT 26  ALKPHOS 48  BILITOT 0.6  PROT 4.3*  ALBUMIN 1.6*   No results for input(s): LIPASE, AMYLASE in the last 168 hours. No results for input(s): AMMONIA in the last 168 hours.  CBC: Recent Labs  Lab 11/16/21 0415 11/17/21 0405 11/20/21 0650  WBC 39.2* 23.0* 14.9*  HGB 9.2* 9.7* 8.3*  HCT 30.9* 32.5* 27.8*  MCV 86.6 87.6 87.7  PLT 232 192 162    Cardiac Enzymes: No results for input(s): CKTOTAL, CKMB, CKMBINDEX, TROPONINI in the last 168 hours.  BNP (last 3  results) Recent Labs    12/08/20 0100 03/04/21 1555 08/21/21 0504  BNP 146.1* 93.2 142.9*    ProBNP (last 3 results) No results for input(s): PROBNP in the last 8760 hours.  Radiological Exams: No results found.  Assessment/Plan Active Problems:   COPD, severe (HCC)   Acute on chronic respiratory failure with hypoxia (HCC)   HCAP (healthcare-associated pneumonia)   COVID-19   Severe sepsis (HCC)   Acute on chronic respiratory failure with hypoxia plan is going to be to continue with weaning protocol supposed to do 2 hours of T collar today Severe COPD medical management we will continue to follow along closely. Healthcare associated pneumonia has been treated with antibiotics COVID-19 virus infection in recovery Severe sepsis in resolution   I have personally seen and evaluated the patient, evaluated laboratory and imaging results, formulated the assessment and plan and placed orders. The Patient requires high complexity decision making with multiple systems involvement.  Rounds were done with the Respiratory Therapy Director and Staff therapists and discussed with nursing staff also.  Allyne Gee, MD [facility] Pulmonary Critical Care Medicine Sleep Medicine

## 2021-11-22 DIAGNOSIS — R6520 Severe sepsis without septic shock: Secondary | ICD-10-CM | POA: Diagnosis not present

## 2021-11-22 DIAGNOSIS — U071 COVID-19: Secondary | ICD-10-CM | POA: Diagnosis not present

## 2021-11-22 DIAGNOSIS — A419 Sepsis, unspecified organism: Secondary | ICD-10-CM | POA: Diagnosis not present

## 2021-11-22 DIAGNOSIS — J449 Chronic obstructive pulmonary disease, unspecified: Secondary | ICD-10-CM | POA: Diagnosis not present

## 2021-11-22 DIAGNOSIS — J189 Pneumonia, unspecified organism: Secondary | ICD-10-CM | POA: Diagnosis not present

## 2021-11-22 DIAGNOSIS — J9621 Acute and chronic respiratory failure with hypoxia: Secondary | ICD-10-CM | POA: Diagnosis not present

## 2021-11-22 NOTE — Progress Notes (Signed)
Pulmonary Critical Care Medicine [place]   PULMONARY CRITICAL CARE SERVICE  PROGRESS NOTE     Isaac [name]  [id number]  DOB: 1956/07/31   DOA: 11/15/2021  Referring Physician: Satira Sark, MD  HPI: Isaac [name] is a 65 y.o. male being followed for ventilator/airway/oxygen weaning Acute on Chronic Respiratory Failure.  Patient is on pressure support currently on 35% FiO2 good saturations are noted.  Medications: Reviewed on Rounds  Physical Exam:  Vitals: Temperature is 96.9 pulse 89 respiratory rate is 22 blood pressure is 116/60 saturations 98%  Ventilator Settings patient is on pressure support FiO2 35% pressure 12/5  General: Comfortable at this time Neck: supple Cardiovascular: no malignant arrhythmias Respiratory: Scattered rhonchi expansion is equal Skin: no rash seen on limited exam Musculoskeletal: No gross abnormality Psychiatric:unable to assess Neurologic:no involuntary movements         Lab Data:   Basic Metabolic Panel: Recent Labs  Lab 11/16/21 0415 11/17/21 0405  NA 132* 136  K 5.2* 4.3  CL 95* 96*  CO2 30 35*  GLUCOSE 201* 141*  BUN 37* 47*  CREATININE 1.56* 1.51*  CALCIUM 7.2* 7.4*    ABG: Recent Labs  Lab 11/15/21 2200  PHART 7.412  PCO2ART 50.8*  PO2ART 63.2*  HCO3 31.1*  O2SAT 90.0    Liver Function Tests: Recent Labs  Lab 11/17/21 0405  AST 53*  ALT 26  ALKPHOS 48  BILITOT 0.6  PROT 4.3*  ALBUMIN 1.6*   No results for input(s): LIPASE, AMYLASE in the last 168 hours. No results for input(s): AMMONIA in the last 168 hours.  CBC: Recent Labs  Lab 11/16/21 0415 11/17/21 0405 11/20/21 0650  WBC 39.2* 23.0* 14.9*  HGB 9.2* 9.7* 8.3*  HCT 30.9* 32.5* 27.8*  MCV 86.6 87.6 87.7  PLT 232 192 162    Cardiac Enzymes: No results for input(s): CKTOTAL, CKMB, CKMBINDEX, TROPONINI in the last 168 hours.  BNP (last 3 results) Recent Labs    12/08/20 0100 03/04/21 1555  08/21/21 0504  BNP 146.1* 93.2 142.9*    ProBNP (last 3 results) No results for input(s): PROBNP in the last 8760 hours.  Radiological Exams: No results found.  Assessment/Plan Active Problems:   COPD, severe (HCC)   Acute on chronic respiratory failure with hypoxia (HCC)   HCAP (healthcare-associated pneumonia)   COVID-19   Severe sepsis (HCC)   Acute on chronic respiratory failure with hypoxia plan is going to be to wean on T collar for at least 2 hours Severe COPD medical management continue to follow along closely. Healthcare associated pneumonia has been treated with antibiotics slowly improving COVID-19 virus infection slow to heal we will continue to follow along Severe sepsis treated in resolution   I have personally seen and evaluated the patient, evaluated laboratory and imaging results, formulated the assessment and plan and placed orders. The Patient requires high complexity decision making with multiple systems involvement.  Rounds were done with the Respiratory Therapy Director and Staff therapists and discussed with nursing staff also.  Allyne Gee, MD [facility] Pulmonary Critical Care Medicine Sleep Medicine

## 2021-11-23 DIAGNOSIS — J9621 Acute and chronic respiratory failure with hypoxia: Secondary | ICD-10-CM | POA: Diagnosis not present

## 2021-11-23 DIAGNOSIS — U071 COVID-19: Secondary | ICD-10-CM | POA: Diagnosis not present

## 2021-11-23 DIAGNOSIS — J449 Chronic obstructive pulmonary disease, unspecified: Secondary | ICD-10-CM | POA: Diagnosis not present

## 2021-11-23 DIAGNOSIS — J189 Pneumonia, unspecified organism: Secondary | ICD-10-CM | POA: Diagnosis not present

## 2021-11-23 DIAGNOSIS — A419 Sepsis, unspecified organism: Secondary | ICD-10-CM | POA: Diagnosis not present

## 2021-11-23 DIAGNOSIS — R6520 Severe sepsis without septic shock: Secondary | ICD-10-CM | POA: Diagnosis not present

## 2021-11-23 NOTE — Progress Notes (Signed)
Pulmonary Critical Care Medicine [place]   PULMONARY CRITICAL CARE SERVICE  PROGRESS NOTE     Isaac [name]  [id number]  DOB: 07-Feb-1956   DOA: 11/15/2021  Referring Physician: Satira Sark, MD  HPI: Isaac [name] is a 65 y.o. male being followed for ventilator/airway/oxygen weaning Acute on Chronic Respiratory Failure.  Patient is comfortable without distress at this time remains on the ventilator on assist control mode has been on FiO2 40%  Medications: Reviewed on Rounds  Physical Exam:  Vitals: The temperature is 98.4 pulse 76 respiratory rate is 16 blood pressure is 102/58 saturations 99%  Ventilator Settings on assist control FiO2 is 40% tidal volume 5 PEEP 5  General: Comfortable at this time Neck: supple Cardiovascular: no malignant arrhythmias Respiratory: No rhonchi very coarse breath sounds Skin: no rash seen on limited exam Musculoskeletal: No gross abnormality Psychiatric:unable to assess Neurologic:no involuntary movements         Lab Data:   Basic Metabolic Panel: Recent Labs  Lab 11/17/21 0405  NA 136  K 4.3  CL 96*  CO2 35*  GLUCOSE 141*  BUN 47*  CREATININE 1.51*  CALCIUM 7.4*    ABG: No results for input(s): PHART, PCO2ART, PO2ART, HCO3, O2SAT in the last 168 hours.  Liver Function Tests: Recent Labs  Lab 11/17/21 0405  AST 53*  ALT 26  ALKPHOS 48  BILITOT 0.6  PROT 4.3*  ALBUMIN 1.6*   No results for input(s): LIPASE, AMYLASE in the last 168 hours. No results for input(s): AMMONIA in the last 168 hours.  CBC: Recent Labs  Lab 11/17/21 0405 11/20/21 0650  WBC 23.0* 14.9*  HGB 9.7* 8.3*  HCT 32.5* 27.8*  MCV 87.6 87.7  PLT 192 162    Cardiac Enzymes: No results for input(s): CKTOTAL, CKMB, CKMBINDEX, TROPONINI in the last 168 hours.  BNP (last 3 results) Recent Labs    12/08/20 0100 03/04/21 1555 08/21/21 0504  BNP 146.1* 93.2 142.9*    ProBNP (last 3 results) No  results for input(s): PROBNP in the last 8760 hours.  Radiological Exams: No results found.  Assessment/Plan Active Problems:   COPD, severe (HCC)   Acute on chronic respiratory failure with hypoxia (HCC)   HCAP (healthcare-associated pneumonia)   COVID-19   Severe sepsis (HCC)   Acute on chronic respiratory failure hypoxia we will continue with full support on assist control mode titrate oxygen as tolerated patient is supposed to wean today and is supposed to do T collar for approximately 4 hours attempt Severe COPD medical management we will continue to follow along closely. Healthcare associated pneumonia has been treated COVID-19 virus infection in recovery Severe sepsis treated resolved hemodynamics are stable   I have personally seen and evaluated the patient, evaluated laboratory and imaging results, formulated the assessment and plan and placed orders. The Patient requires high complexity decision making with multiple systems involvement.  Rounds were done with the Respiratory Therapy Director and Staff therapists and discussed with nursing staff also.  Allyne Gee, MD [facility] Pulmonary Critical Care Medicine Sleep Medicine

## 2021-11-24 ENCOUNTER — Other Ambulatory Visit (HOSPITAL_COMMUNITY): Payer: Self-pay

## 2021-11-24 DIAGNOSIS — U071 COVID-19: Secondary | ICD-10-CM | POA: Diagnosis not present

## 2021-11-24 DIAGNOSIS — R6520 Severe sepsis without septic shock: Secondary | ICD-10-CM | POA: Diagnosis not present

## 2021-11-24 DIAGNOSIS — J189 Pneumonia, unspecified organism: Secondary | ICD-10-CM | POA: Diagnosis not present

## 2021-11-24 DIAGNOSIS — J9621 Acute and chronic respiratory failure with hypoxia: Secondary | ICD-10-CM | POA: Diagnosis not present

## 2021-11-24 DIAGNOSIS — J449 Chronic obstructive pulmonary disease, unspecified: Secondary | ICD-10-CM | POA: Diagnosis not present

## 2021-11-24 DIAGNOSIS — A419 Sepsis, unspecified organism: Secondary | ICD-10-CM | POA: Diagnosis not present

## 2021-11-24 NOTE — Progress Notes (Signed)
Pulmonary Critical Care Medicine [place]   PULMONARY CRITICAL CARE SERVICE  PROGRESS NOTE     Isaac [name]  [id number]  DOB: 1956/01/06   DOA: 11/15/2021  Referring Physician: Satira Sark, MD  HPI: Isaac [name] is a 65 y.o. male being followed for ventilator/airway/oxygen weaning Acute on Chronic Respiratory Failure.  Patient is resting comfortably without distress at 16 hours of pressure support today so today supposed to do T collar  Medications: Reviewed on Rounds  Physical Exam:  Vitals: Temperature is 97.5 pulse 91 respiratory 26 blood pressure is 145/64 saturations 100%  Ventilator Settings on T collar on 35% FiO2  General: Comfortable at this time Neck: supple Cardiovascular: no malignant arrhythmias Respiratory: Scattered rhonchi very coarse breath sounds Skin: no rash seen on limited exam Musculoskeletal: No gross abnormality Psychiatric:unable to assess Neurologic:no involuntary movements         Lab Data:   Basic Metabolic Panel: No results for input(s): NA, K, CL, CO2, GLUCOSE, BUN, CREATININE, CALCIUM, MG, PHOS in the last 168 hours.  ABG: No results for input(s): PHART, PCO2ART, PO2ART, HCO3, O2SAT in the last 168 hours.  Liver Function Tests: No results for input(s): AST, ALT, ALKPHOS, BILITOT, PROT, ALBUMIN in the last 168 hours. No results for input(s): LIPASE, AMYLASE in the last 168 hours. No results for input(s): AMMONIA in the last 168 hours.  CBC: Recent Labs  Lab 11/20/21 0650  WBC 14.9*  HGB 8.3*  HCT 27.8*  MCV 87.7  PLT 162    Cardiac Enzymes: No results for input(s): CKTOTAL, CKMB, CKMBINDEX, TROPONINI in the last 168 hours.  BNP (last 3 results) Recent Labs    12/08/20 0100 03/04/21 1555 08/21/21 0504  BNP 146.1* 93.2 142.9*    ProBNP (last 3 results) No results for input(s): PROBNP in the last 8760 hours.  Radiological Exams: No results found.  Assessment/Plan Active  Problems:   COPD, severe (HCC)   Acute on chronic respiratory failure with hypoxia (HCC)   HCAP (healthcare-associated pneumonia)   COVID-19   Severe sepsis (HCC)   Acute on chronic respiratory failure hypoxia we will continue with pulmonary toilet secretion management and follow along. COVID-19 virus infection in recovery phase we will continue to follow Severe sepsis treated resolved hemodynamics are stable Severe COPD medical management Healthcare associated pneumonia treated improving slightly   I have personally seen and evaluated the patient, evaluated laboratory and imaging results, formulated the assessment and plan and placed orders. The Patient requires high complexity decision making with multiple systems involvement.  Rounds were done with the Respiratory Therapy Director and Staff therapists and discussed with nursing staff also.  Allyne Gee, MD [facility] Pulmonary Critical Care Medicine Sleep Medicine

## 2021-11-26 DIAGNOSIS — U071 COVID-19: Secondary | ICD-10-CM | POA: Diagnosis not present

## 2021-11-26 DIAGNOSIS — I10 Essential (primary) hypertension: Secondary | ICD-10-CM | POA: Diagnosis not present

## 2021-11-26 DIAGNOSIS — R6520 Severe sepsis without septic shock: Secondary | ICD-10-CM | POA: Diagnosis not present

## 2021-11-26 DIAGNOSIS — A419 Sepsis, unspecified organism: Secondary | ICD-10-CM | POA: Diagnosis not present

## 2021-11-26 DIAGNOSIS — J189 Pneumonia, unspecified organism: Secondary | ICD-10-CM | POA: Diagnosis not present

## 2021-11-26 DIAGNOSIS — J9621 Acute and chronic respiratory failure with hypoxia: Secondary | ICD-10-CM | POA: Diagnosis not present

## 2021-11-26 DIAGNOSIS — J9620 Acute and chronic respiratory failure, unspecified whether with hypoxia or hypercapnia: Secondary | ICD-10-CM | POA: Diagnosis not present

## 2021-11-26 DIAGNOSIS — J449 Chronic obstructive pulmonary disease, unspecified: Secondary | ICD-10-CM | POA: Diagnosis not present

## 2021-11-26 DIAGNOSIS — F419 Anxiety disorder, unspecified: Secondary | ICD-10-CM | POA: Diagnosis not present

## 2021-11-26 NOTE — Progress Notes (Signed)
Pulmonary Critical Care Medicine [place]   PULMONARY CRITICAL CARE SERVICE  PROGRESS NOTE     IVEY CINA  [id number]  DOB: Dec 16, 1955   DOA: 11/15/2021  Referring Physician: Satira Sark, MD  HPI: Isaac [name] is a 66 y.o. male being followed for ventilator/airway/oxygen weaning Acute on Chronic Respiratory Failure.  Patient is on pressure support this morning supposed to do 12 hours of T collar  Medications: Reviewed on Rounds  Physical Exam:  Vitals: Temperature is 97.7 pulse 101 respiratory is 25 blood pressure is 116/68 saturations 96%  Ventilator Settings on T collar FiO2 40% pressure support for resting mode  General: Comfortable at this time Neck: supple Cardiovascular: no malignant arrhythmias Respiratory: Scattered rhonchi are noted bilaterally Skin: no rash seen on limited exam Musculoskeletal: No gross abnormality Psychiatric:unable to assess Neurologic:no involuntary movements         Lab Data:   Basic Metabolic Panel: No results for input(s): NA, K, CL, CO2, GLUCOSE, BUN, CREATININE, CALCIUM, MG, PHOS in the last 168 hours.  ABG: No results for input(s): PHART, PCO2ART, PO2ART, HCO3, O2SAT in the last 168 hours.  Liver Function Tests: No results for input(s): AST, ALT, ALKPHOS, BILITOT, PROT, ALBUMIN in the last 168 hours. No results for input(s): LIPASE, AMYLASE in the last 168 hours. No results for input(s): AMMONIA in the last 168 hours.  CBC: Recent Labs  Lab 11/20/21 0650  WBC 14.9*  HGB 8.3*  HCT 27.8*  MCV 87.7  PLT 162    Cardiac Enzymes: No results for input(s): CKTOTAL, CKMB, CKMBINDEX, TROPONINI in the last 168 hours.  BNP (last 3 results) Recent Labs    12/08/20 0100 03/04/21 1555 08/21/21 0504  BNP 146.1* 93.2 142.9*    ProBNP (last 3 results) No results for input(s): PROBNP in the last 8760 hours.  Radiological Exams: No results found.  Assessment/Plan Active Problems:    COPD, severe (HCC)   Acute on chronic respiratory failure with hypoxia (HCC)   HCAP (healthcare-associated pneumonia)   COVID-19   Severe sepsis (HCC)   Acute on chronic respiratory failure hypoxia plan is to wean on the 12 hours of T collar patient is requiring 40% FiO2 Severe COPD medical management we will continue to follow along closely. COVID-19 virus infection in recovery Healthcare associated pneumonia has been treated Severe sepsis treated resolved   I have personally seen and evaluated the patient, evaluated laboratory and imaging results, formulated the assessment and plan and placed orders. The Patient requires high complexity decision making with multiple systems involvement.  Rounds were done with the Respiratory Therapy Director and Staff therapists and discussed with nursing staff also.  Allyne Gee, MD [facility] Pulmonary Critical Care Medicine Sleep Medicine

## 2021-11-27 ENCOUNTER — Other Ambulatory Visit (HOSPITAL_COMMUNITY): Payer: Self-pay

## 2021-11-27 DIAGNOSIS — I10 Essential (primary) hypertension: Secondary | ICD-10-CM | POA: Diagnosis not present

## 2021-11-27 DIAGNOSIS — A419 Sepsis, unspecified organism: Secondary | ICD-10-CM | POA: Diagnosis not present

## 2021-11-27 DIAGNOSIS — R6520 Severe sepsis without septic shock: Secondary | ICD-10-CM | POA: Diagnosis not present

## 2021-11-27 DIAGNOSIS — U071 COVID-19: Secondary | ICD-10-CM | POA: Diagnosis not present

## 2021-11-27 DIAGNOSIS — J189 Pneumonia, unspecified organism: Secondary | ICD-10-CM | POA: Diagnosis not present

## 2021-11-27 DIAGNOSIS — J449 Chronic obstructive pulmonary disease, unspecified: Secondary | ICD-10-CM | POA: Diagnosis not present

## 2021-11-27 DIAGNOSIS — J9621 Acute and chronic respiratory failure with hypoxia: Secondary | ICD-10-CM | POA: Diagnosis not present

## 2021-11-27 LAB — CBC
HCT: 27.8 % — ABNORMAL LOW (ref 39.0–52.0)
Hemoglobin: 8.3 g/dL — ABNORMAL LOW (ref 13.0–17.0)
MCH: 25.9 pg — ABNORMAL LOW (ref 26.0–34.0)
MCHC: 29.9 g/dL — ABNORMAL LOW (ref 30.0–36.0)
MCV: 86.6 fL (ref 80.0–100.0)
Platelets: 150 10*3/uL (ref 150–400)
RBC: 3.21 MIL/uL — ABNORMAL LOW (ref 4.22–5.81)
RDW: 15.3 % (ref 11.5–15.5)
WBC: 7.7 10*3/uL (ref 4.0–10.5)
nRBC: 0 % (ref 0.0–0.2)

## 2021-11-27 LAB — BASIC METABOLIC PANEL
Anion gap: 5 (ref 5–15)
BUN: 23 mg/dL (ref 8–23)
CO2: 33 mmol/L — ABNORMAL HIGH (ref 22–32)
Calcium: 7.8 mg/dL — ABNORMAL LOW (ref 8.9–10.3)
Chloride: 98 mmol/L (ref 98–111)
Creatinine, Ser: 0.77 mg/dL (ref 0.61–1.24)
GFR, Estimated: >60 mL/min (ref >60)
Glucose, Bld: 141 mg/dL — ABNORMAL HIGH (ref 70–99)
Potassium: 4.9 mmol/L (ref 3.5–5.1)
Sodium: 136 mmol/L (ref 135–145)

## 2021-11-27 NOTE — Progress Notes (Signed)
Pulmonary Critical Care Medicine [place]   PULMONARY CRITICAL CARE SERVICE  PROGRESS NOTE     ENDY EASTERLY  [id number]  DOB: 1956/04/04   DOA: 11/15/2021  Referring Physician: Satira Sark, MD  HPI: TARIQ PERNELL is a 66 y.o. male being followed for ventilator/airway/oxygen weaning Acute on Chronic Respiratory Failure.  Patient is on T collar currently on 40% FiO2 good saturations are noted  Medications: Reviewed on Rounds  Physical Exam:  Vitals: Temperature is 96.5 pulse 95 respiratory 19 blood pressure is 136/75 saturations 97%  Ventilator Settings off the ventilator on T collar  General: Comfortable at this time Neck: supple Cardiovascular: no malignant arrhythmias Respiratory: Scattered rhonchi coarse breath sounds Skin: no rash seen on limited exam Musculoskeletal: No gross abnormality Psychiatric:unable to assess Neurologic:no involuntary movements         Lab Data:   Basic Metabolic Panel: Recent Labs  Lab 11/27/21 0645  NA 136  K 4.9  CL 98  CO2 33*  GLUCOSE 141*  BUN 23  CREATININE 0.77  CALCIUM 7.8*    ABG: No results for input(s): PHART, PCO2ART, PO2ART, HCO3, O2SAT in the last 168 hours.  Liver Function Tests: No results for input(s): AST, ALT, ALKPHOS, BILITOT, PROT, ALBUMIN in the last 168 hours. No results for input(s): LIPASE, AMYLASE in the last 168 hours. No results for input(s): AMMONIA in the last 168 hours.  CBC: Recent Labs  Lab 11/27/21 0645  WBC 7.7  HGB 8.3*  HCT 27.8*  MCV 86.6  PLT 150    Cardiac Enzymes: No results for input(s): CKTOTAL, CKMB, CKMBINDEX, TROPONINI in the last 168 hours.  BNP (last 3 results) Recent Labs    12/08/20 0100 03/04/21 1555 08/21/21 0504  BNP 146.1* 93.2 142.9*    ProBNP (last 3 results) No results for input(s): PROBNP in the last 8760 hours.  Radiological Exams: No results found.  Assessment/Plan Active Problems:   COPD, severe  (HCC)   Acute on chronic respiratory failure with hypoxia (HCC)   HCAP (healthcare-associated pneumonia)   COVID-19   Severe sepsis (HCC)   Acute on chronic respiratory failure with hypoxia the goal is for 16 hours on T collar.  Titrate oxygen as tolerated continue secretion management pulmonary toilet Healthcare associated pneumonia has been treated we will continue to monitor follow-up x-rays as warranted Severe COPD medical management COVID-19 virus infection in recovery we will continue to follow along closely. Severe sepsis treated resolved   I have personally seen and evaluated the patient, evaluated laboratory and imaging results, formulated the assessment and plan and placed orders. The Patient requires high complexity decision making with multiple systems involvement.  Rounds were done with the Respiratory Therapy Director and Staff therapists and discussed with nursing staff also.  Allyne Gee, MD [facility] Pulmonary Critical Care Medicine Sleep Medicine

## 2021-11-28 DIAGNOSIS — D638 Anemia in other chronic diseases classified elsewhere: Secondary | ICD-10-CM | POA: Diagnosis not present

## 2021-11-28 DIAGNOSIS — J449 Chronic obstructive pulmonary disease, unspecified: Secondary | ICD-10-CM | POA: Diagnosis not present

## 2021-11-28 DIAGNOSIS — R6520 Severe sepsis without septic shock: Secondary | ICD-10-CM | POA: Diagnosis not present

## 2021-11-28 DIAGNOSIS — I10 Essential (primary) hypertension: Secondary | ICD-10-CM | POA: Diagnosis not present

## 2021-11-28 DIAGNOSIS — J9621 Acute and chronic respiratory failure with hypoxia: Secondary | ICD-10-CM | POA: Diagnosis not present

## 2021-11-28 DIAGNOSIS — U071 COVID-19: Secondary | ICD-10-CM | POA: Diagnosis not present

## 2021-11-28 DIAGNOSIS — J189 Pneumonia, unspecified organism: Secondary | ICD-10-CM | POA: Diagnosis not present

## 2021-11-28 DIAGNOSIS — J9620 Acute and chronic respiratory failure, unspecified whether with hypoxia or hypercapnia: Secondary | ICD-10-CM | POA: Diagnosis not present

## 2021-11-28 DIAGNOSIS — A419 Sepsis, unspecified organism: Secondary | ICD-10-CM | POA: Diagnosis not present

## 2021-11-28 NOTE — Progress Notes (Signed)
Pulmonary Critical Care Medicine [place]   PULMONARY CRITICAL CARE SERVICE  PROGRESS NOTE     SAJID RUPPERT  [id number]  DOB: [birth year]   DOA: 11/15/2021  Referring Physician: Satira Sark, MD  HPI: Isaac [name] is a 66 y.o. male being followed for ventilator/airway/oxygen weaning Acute on Chronic Respiratory Failure.  Patient is resting comfortably without distress no fevers are noted has been on T collar 40%  Medications: Reviewed on Rounds  Physical Exam:  Vitals: Temperature is 98.2 pulse 94 respiratory is 24 blood pressure is 129/72 saturations 99%  Ventilator Settings patient is currently on the T collar 40% FiO2  General: Comfortable at this time Neck: supple Cardiovascular: no malignant arrhythmias Respiratory: Scattered rhonchi expansion is equal Skin: no rash seen on limited exam Musculoskeletal: No gross abnormality Psychiatric:unable to assess Neurologic:no involuntary movements         Lab Data:   Basic Metabolic Panel: Recent Labs  Lab 11/27/21 0645  NA 136  K 4.9  CL 98  CO2 33*  GLUCOSE 141*  BUN 23  CREATININE 0.77  CALCIUM 7.8*    ABG: No results for input(s): PHART, PCO2ART, PO2ART, HCO3, O2SAT in the last 168 hours.  Liver Function Tests: No results for input(s): AST, ALT, ALKPHOS, BILITOT, PROT, ALBUMIN in the last 168 hours. No results for input(s): LIPASE, AMYLASE in the last 168 hours. No results for input(s): AMMONIA in the last 168 hours.  CBC: Recent Labs  Lab 11/27/21 0645  WBC 7.7  HGB 8.3*  HCT 27.8*  MCV 86.6  PLT 150    Cardiac Enzymes: No results for input(s): CKTOTAL, CKMB, CKMBINDEX, TROPONINI in the last 168 hours.  BNP (last 3 results) Recent Labs    12/08/20 0100 03/04/21 1555 08/21/21 0504  BNP 146.1* 93.2 142.9*    ProBNP (last 3 results) No results for input(s): PROBNP in the last 8760 hours.  Radiological Exams: No results  found.  Assessment/Plan Active Problems:   COPD, severe (HCC)   Acute on chronic respiratory failure with hypoxia (HCC)   HCAP (healthcare-associated pneumonia)   COVID-19   Severe sepsis (HCC)   Acute on chronic respiratory failure with hypoxia wean on T-piece goal is 20 hours today.  We will continue to advance Severe COPD no change we will continue to follow along closely. Healthcare associated pneumonia has been treated COVID-19 virus infection in recovery Severe sepsis resolved   I have personally seen and evaluated the patient, evaluated laboratory and imaging results, formulated the assessment and plan and placed orders. The Patient requires high complexity decision making with multiple systems involvement.  Rounds were done with the Respiratory Therapy Director and Staff therapists and discussed with nursing staff also.  Allyne Gee, MD [facility] Pulmonary Critical Care Medicine Sleep Medicine

## 2021-11-29 ENCOUNTER — Other Ambulatory Visit (HOSPITAL_COMMUNITY): Payer: BC Managed Care – PPO

## 2021-11-29 DIAGNOSIS — J9620 Acute and chronic respiratory failure, unspecified whether with hypoxia or hypercapnia: Secondary | ICD-10-CM | POA: Diagnosis not present

## 2021-11-29 DIAGNOSIS — J449 Chronic obstructive pulmonary disease, unspecified: Secondary | ICD-10-CM | POA: Diagnosis not present

## 2021-11-29 DIAGNOSIS — J9621 Acute and chronic respiratory failure with hypoxia: Secondary | ICD-10-CM | POA: Diagnosis not present

## 2021-11-29 DIAGNOSIS — F419 Anxiety disorder, unspecified: Secondary | ICD-10-CM | POA: Diagnosis not present

## 2021-11-29 DIAGNOSIS — J189 Pneumonia, unspecified organism: Secondary | ICD-10-CM | POA: Diagnosis not present

## 2021-11-29 DIAGNOSIS — U071 COVID-19: Secondary | ICD-10-CM | POA: Diagnosis not present

## 2021-11-29 DIAGNOSIS — J90 Pleural effusion, not elsewhere classified: Secondary | ICD-10-CM | POA: Diagnosis not present

## 2021-11-29 DIAGNOSIS — I10 Essential (primary) hypertension: Secondary | ICD-10-CM | POA: Diagnosis not present

## 2021-11-29 LAB — CBC
HCT: 30 % — ABNORMAL LOW (ref 39.0–52.0)
Hemoglobin: 8.6 g/dL — ABNORMAL LOW (ref 13.0–17.0)
MCH: 25.1 pg — ABNORMAL LOW (ref 26.0–34.0)
MCHC: 28.7 g/dL — ABNORMAL LOW (ref 30.0–36.0)
MCV: 87.7 fL (ref 80.0–100.0)
Platelets: 190 10*3/uL (ref 150–400)
RBC: 3.42 MIL/uL — ABNORMAL LOW (ref 4.22–5.81)
RDW: 15.4 % (ref 11.5–15.5)
WBC: 7.2 10*3/uL (ref 4.0–10.5)
nRBC: 0 % (ref 0.0–0.2)

## 2021-11-29 LAB — BASIC METABOLIC PANEL
Anion gap: 8 (ref 5–15)
BUN: 22 mg/dL (ref 8–23)
CO2: 33 mmol/L — ABNORMAL HIGH (ref 22–32)
Calcium: 8.5 mg/dL — ABNORMAL LOW (ref 8.9–10.3)
Chloride: 97 mmol/L — ABNORMAL LOW (ref 98–111)
Creatinine, Ser: 0.8 mg/dL (ref 0.61–1.24)
GFR, Estimated: >60 mL/min (ref >60)
Glucose, Bld: 242 mg/dL — ABNORMAL HIGH (ref 70–99)
Potassium: 5.2 mmol/L — ABNORMAL HIGH (ref 3.5–5.1)
Sodium: 138 mmol/L (ref 135–145)

## 2021-11-29 LAB — POTASSIUM: Potassium: 4.5 mmol/L (ref 3.5–5.1)

## 2021-11-29 LAB — MAGNESIUM: Magnesium: 2.3 mg/dL (ref 1.7–2.4)

## 2021-11-29 LAB — PHOSPHORUS: Phosphorus: 5.6 mg/dL — ABNORMAL HIGH (ref 2.5–4.6)

## 2021-11-29 NOTE — Progress Notes (Signed)
Pulmonary Critical Care Medicine [place]   PULMONARY CRITICAL CARE SERVICE  PROGRESS NOTE     Isaac [name]  ZOX:[id number]  DOB: [birth year]   DOA: 11/15/2021  Referring Physician: Satira Sark, MD  HPI: Isaac [name] is a 66 y.o. male being followed for ventilator/airway/oxygen weaning Acute on Chronic Respiratory Failure.  Patient is on full support assist control yesterday did about 20 hours of T collar  Medications: Reviewed on Rounds  Physical Exam:  Vitals: Temperature is 96.9 pulse 105 respiratory rate 22 blood pressure is 102/48 saturations 100%  Ventilator Settings on assist control FiO2 40% tidal volume 598 PEEP 5  General: Comfortable at this time Neck: supple Cardiovascular: no malignant arrhythmias Respiratory: Coarse rhonchi expansion is equal Skin: no rash seen on limited exam Musculoskeletal: No gross abnormality Psychiatric:unable to assess Neurologic:no involuntary movements         Lab Data:   Basic Metabolic Panel: Recent Labs  Lab 11/27/21 0645 11/29/21 0440  NA 136 138  K 4.9 5.2*  CL 98 97*  CO2 33* 33*  GLUCOSE 141* 242*  BUN 23 22  CREATININE 0.77 0.80  CALCIUM 7.8* 8.5*  MG  --  2.3  PHOS  --  5.6*    ABG: No results for input(s): PHART, PCO2ART, PO2ART, HCO3, O2SAT in the last 168 hours.  Liver Function Tests: No results for input(s): AST, ALT, ALKPHOS, BILITOT, PROT, ALBUMIN in the last 168 hours. No results for input(s): LIPASE, AMYLASE in the last 168 hours. No results for input(s): AMMONIA in the last 168 hours.  CBC: Recent Labs  Lab 11/27/21 0645 11/29/21 0440  WBC 7.7 7.2  HGB 8.3* 8.6*  HCT 27.8* 30.0*  MCV 86.6 87.7  PLT 150 190    Cardiac Enzymes: No results for input(s): CKTOTAL, CKMB, CKMBINDEX, TROPONINI in the last 168 hours.  BNP (last 3 results) Recent Labs    12/08/20 0100 03/04/21 1555 08/21/21 0504  BNP 146.1* 93.2 142.9*    ProBNP (last 3 results) No  results for input(s): PROBNP in the last 8760 hours.  Radiological Exams: DG Chest Port 1 View  Result Date: 11/29/2021 CLINICAL DATA:  Pneumonia. EXAM: PORTABLE CHEST 1 VIEW COMPARISON:  11/15/2021 FINDINGS: 0532 hours. Persistent bibasilar airspace disease, stable on the right and mildly improved on the left. Small left pleural effusion again noted. Interstitial markings are diffusely coarsened with chronic features. The cardio pericardial silhouette is enlarged. Tracheostomy tube remains in place. A feeding tube passes into the stomach although the distal tip position is not included on the film. Telemetry leads overlie the chest. IMPRESSION: 1. Persistent bibasilar airspace disease, stable on the right and mildly improved on the left. 2. Small left pleural effusion. Electronically Signed   By: Misty Stanley M.D.   On: 11/29/2021 06:32    Assessment/Plan Active Problems:   COPD, severe (HCC)   Acute on chronic respiratory failure with hypoxia (HCC)   HCAP (healthcare-associated pneumonia)   COVID-19   Severe sepsis (HCC)   Acute on chronic respiratory failure with hypoxia we will continue with assist control mode on 40% FiO2 patient is approximately 20 hours we will try to advance further. Severe COPD medical management continue to monitor along closely. COVID-19 virus infection in recovery phase Healthcare associated pneumonia has been treated we will continue to follow along closely Severe sepsis treated resolved hemodynamics are stable   I have personally seen and evaluated the patient, evaluated laboratory and imaging results, formulated the assessment  and plan and placed orders. The Patient requires high complexity decision making with multiple systems involvement.  Rounds were done with the Respiratory Therapy Director and Staff therapists and discussed with nursing staff also.  Allyne Gee, MD [facility] Pulmonary Critical Care Medicine Sleep Medicine

## 2021-11-30 DIAGNOSIS — J9621 Acute and chronic respiratory failure with hypoxia: Secondary | ICD-10-CM | POA: Diagnosis not present

## 2021-11-30 DIAGNOSIS — J189 Pneumonia, unspecified organism: Secondary | ICD-10-CM | POA: Diagnosis not present

## 2021-11-30 DIAGNOSIS — I10 Essential (primary) hypertension: Secondary | ICD-10-CM | POA: Diagnosis not present

## 2021-11-30 DIAGNOSIS — U071 COVID-19: Secondary | ICD-10-CM | POA: Diagnosis not present

## 2021-11-30 DIAGNOSIS — D638 Anemia in other chronic diseases classified elsewhere: Secondary | ICD-10-CM | POA: Diagnosis not present

## 2021-11-30 DIAGNOSIS — A419 Sepsis, unspecified organism: Secondary | ICD-10-CM | POA: Diagnosis not present

## 2021-11-30 DIAGNOSIS — J9620 Acute and chronic respiratory failure, unspecified whether with hypoxia or hypercapnia: Secondary | ICD-10-CM | POA: Diagnosis not present

## 2021-11-30 DIAGNOSIS — R6520 Severe sepsis without septic shock: Secondary | ICD-10-CM | POA: Diagnosis not present

## 2021-11-30 DIAGNOSIS — J449 Chronic obstructive pulmonary disease, unspecified: Secondary | ICD-10-CM | POA: Diagnosis not present

## 2021-11-30 LAB — BASIC METABOLIC PANEL
Anion gap: 6 (ref 5–15)
BUN: 23 mg/dL (ref 8–23)
CO2: 34 mmol/L — ABNORMAL HIGH (ref 22–32)
Calcium: 7.9 mg/dL — ABNORMAL LOW (ref 8.9–10.3)
Chloride: 99 mmol/L (ref 98–111)
Creatinine, Ser: 0.8 mg/dL (ref 0.61–1.24)
GFR, Estimated: >60 mL/min (ref >60)
Glucose, Bld: 126 mg/dL — ABNORMAL HIGH (ref 70–99)
Potassium: 4.4 mmol/L (ref 3.5–5.1)
Sodium: 139 mmol/L (ref 135–145)

## 2021-11-30 NOTE — Progress Notes (Signed)
Pulmonary Critical Care Medicine [place]   PULMONARY CRITICAL CARE SERVICE  PROGRESS NOTE     BLAIZE EPPLE  WGN:[id number]  DOB: August 17, 1956   DOA: 11/15/2021  Referring Physician: Satira Sark, MD  HPI: ADIT RIDDLES is a 66 y.o. male being followed for ventilator/airway/oxygen weaning Acute on Chronic Respiratory Failure.  Patient is afebrile without distress on T collar supposed to be doing 20 hours today  Medications: Reviewed on Rounds  Physical Exam:  Vitals: Temperature is 98 pulse 100 respiratory 22 blood pressure is 117/60 saturations 98%  Ventilator Settings on T collar FiO2 35% goal of 20 hours  General: Comfortable at this time Neck: supple Cardiovascular: no malignant arrhythmias Respiratory: No rhonchi very coarse breath sounds Skin: no rash seen on limited exam Musculoskeletal: No gross abnormality Psychiatric:unable to assess Neurologic:no involuntary movements         Lab Data:   Basic Metabolic Panel: Recent Labs  Lab 11/27/21 0645 11/29/21 0440 11/29/21 2301 11/30/21 0410  NA 136 138  --  139  K 4.9 5.2* 4.5 4.4  CL 98 97*  --  99  CO2 33* 33*  --  34*  GLUCOSE 141* 242*  --  126*  BUN 23 22  --  23  CREATININE 0.77 0.80  --  0.80  CALCIUM 7.8* 8.5*  --  7.9*  MG  --  2.3  --   --   PHOS  --  5.6*  --   --     ABG: No results for input(s): PHART, PCO2ART, PO2ART, HCO3, O2SAT in the last 168 hours.  Liver Function Tests: No results for input(s): AST, ALT, ALKPHOS, BILITOT, PROT, ALBUMIN in the last 168 hours. No results for input(s): LIPASE, AMYLASE in the last 168 hours. No results for input(s): AMMONIA in the last 168 hours.  CBC: Recent Labs  Lab 11/27/21 0645 11/29/21 0440  WBC 7.7 7.2  HGB 8.3* 8.6*  HCT 27.8* 30.0*  MCV 86.6 87.7  PLT 150 190    Cardiac Enzymes: No results for input(s): CKTOTAL, CKMB, CKMBINDEX, TROPONINI in the last 168 hours.  BNP (last 3 results) Recent Labs     12/08/20 0100 03/04/21 1555 08/21/21 0504  BNP 146.1* 93.2 142.9*    ProBNP (last 3 results) No results for input(s): PROBNP in the last 8760 hours.  Radiological Exams: DG Chest Port 1 View  Result Date: 11/29/2021 CLINICAL DATA:  Pneumonia. EXAM: PORTABLE CHEST 1 VIEW COMPARISON:  11/15/2021 FINDINGS: 0532 hours. Persistent bibasilar airspace disease, stable on the right and mildly improved on the left. Small left pleural effusion again noted. Interstitial markings are diffusely coarsened with chronic features. The cardio pericardial silhouette is enlarged. Tracheostomy tube remains in place. A feeding tube passes into the stomach although the distal tip position is not included on the film. Telemetry leads overlie the chest. IMPRESSION: 1. Persistent bibasilar airspace disease, stable on the right and mildly improved on the left. 2. Small left pleural effusion. Electronically Signed   By: Misty Stanley M.D.   On: 11/29/2021 06:32    Assessment/Plan Active Problems:   COPD, severe (HCC)   Acute on chronic respiratory failure with hypoxia (HCC)   HCAP (healthcare-associated pneumonia)   COVID-19   Severe sepsis (HCC)   Acute on chronic respiratory failure with hypoxia plan is to continue to wean on T-piece goal is 20 hours today as tolerated Severe COPD medical management we will continue to follow along closely. Healthcare associated pneumonia  has been treated we will continue supportive care x-ray results as noted above Severe sepsis resolved hemodynamics are stable COVID-19 virus infection in recovery   I have personally seen and evaluated the patient, evaluated laboratory and imaging results, formulated the assessment and plan and placed orders. The Patient requires high complexity decision making with multiple systems involvement.  Rounds were done with the Respiratory Therapy Director and Staff therapists and discussed with nursing staff also.  Allyne Gee, MD  [facility] Pulmonary Critical Care Medicine Sleep Medicine

## 2021-12-01 DIAGNOSIS — J449 Chronic obstructive pulmonary disease, unspecified: Secondary | ICD-10-CM | POA: Diagnosis not present

## 2021-12-01 DIAGNOSIS — A419 Sepsis, unspecified organism: Secondary | ICD-10-CM | POA: Diagnosis not present

## 2021-12-01 DIAGNOSIS — I10 Essential (primary) hypertension: Secondary | ICD-10-CM | POA: Diagnosis not present

## 2021-12-01 DIAGNOSIS — J189 Pneumonia, unspecified organism: Secondary | ICD-10-CM | POA: Diagnosis not present

## 2021-12-01 DIAGNOSIS — D638 Anemia in other chronic diseases classified elsewhere: Secondary | ICD-10-CM | POA: Diagnosis not present

## 2021-12-01 DIAGNOSIS — R6520 Severe sepsis without septic shock: Secondary | ICD-10-CM | POA: Diagnosis not present

## 2021-12-01 DIAGNOSIS — J9621 Acute and chronic respiratory failure with hypoxia: Secondary | ICD-10-CM | POA: Diagnosis not present

## 2021-12-01 DIAGNOSIS — J9620 Acute and chronic respiratory failure, unspecified whether with hypoxia or hypercapnia: Secondary | ICD-10-CM | POA: Diagnosis not present

## 2021-12-01 DIAGNOSIS — U071 COVID-19: Secondary | ICD-10-CM | POA: Diagnosis not present

## 2021-12-01 NOTE — Progress Notes (Signed)
Pulmonary Critical Care Medicine [place]   PULMONARY CRITICAL CARE SERVICE  PROGRESS NOTE     Isaac [name]  KMM:[id number]  DOB: August 02, 1956   DOA: 11/15/2021  Referring Physician: Satira Sark, MD  HPI: Isaac [name] is a 66 y.o. male being followed for ventilator/airway/oxygen weaning Acute on Chronic Respiratory Failure.  Patient is comfortable without any distress at this time has been on T collar has been on 40% FiO2  Medications: Reviewed on Rounds  Physical Exam:  Vitals: Temperature is 97.3 pulse 84 respiratory is 15 blood pressure is 121/63 saturations 99%  Ventilator Settings on T collar with an FiO2 40%  General: Comfortable at this time Neck: supple Cardiovascular: no malignant arrhythmias Respiratory: No rhonchi very coarse breath sounds Skin: no rash seen on limited exam Musculoskeletal: No gross abnormality Psychiatric:unable to assess Neurologic:no involuntary movements         Lab Data:   Basic Metabolic Panel: Recent Labs  Lab 11/27/21 0645 11/29/21 0440 11/29/21 2301 11/30/21 0410  NA 136 138  --  139  K 4.9 5.2* 4.5 4.4  CL 98 97*  --  99  CO2 33* 33*  --  34*  GLUCOSE 141* 242*  --  126*  BUN 23 22  --  23  CREATININE 0.77 0.80  --  0.80  CALCIUM 7.8* 8.5*  --  7.9*  MG  --  2.3  --   --   PHOS  --  5.6*  --   --     ABG: No results for input(s): PHART, PCO2ART, PO2ART, HCO3, O2SAT in the last 168 hours.  Liver Function Tests: No results for input(s): AST, ALT, ALKPHOS, BILITOT, PROT, ALBUMIN in the last 168 hours. No results for input(s): LIPASE, AMYLASE in the last 168 hours. No results for input(s): AMMONIA in the last 168 hours.  CBC: Recent Labs  Lab 11/27/21 0645 11/29/21 0440  WBC 7.7 7.2  HGB 8.3* 8.6*  HCT 27.8* 30.0*  MCV 86.6 87.7  PLT 150 190    Cardiac Enzymes: No results for input(s): CKTOTAL, CKMB, CKMBINDEX, TROPONINI in the last 168 hours.  BNP (last 3 results) Recent  Labs    12/08/20 0100 03/04/21 1555 08/21/21 0504  BNP 146.1* 93.2 142.9*    ProBNP (last 3 results) No results for input(s): PROBNP in the last 8760 hours.  Radiological Exams: No results found.  Assessment/Plan Active Problems:   COPD, severe (HCC)   Acute on chronic respiratory failure with hypoxia (HCC)   HCAP (healthcare-associated pneumonia)   COVID-19   Severe sepsis (HCC)   Acute on chronic respiratory failure with hypoxia patient currently is on T-piece has been on 40% FiO2 Severe COPD medical management we will continue to monitor Healthcare associated pneumonia treated we will continue to follow along closely COVID-19 virus infection in recovery phase Severe sepsis treated resolved   I have personally seen and evaluated the patient, evaluated laboratory and imaging results, formulated the assessment and plan and placed orders. The Patient requires high complexity decision making with multiple systems involvement.  Rounds were done with the Respiratory Therapy Director and Staff therapists and discussed with nursing staff also.  Allyne Gee, MD [facility] Pulmonary Critical Care Medicine Sleep Medicine

## 2021-12-02 ENCOUNTER — Other Ambulatory Visit (HOSPITAL_COMMUNITY): Payer: BC Managed Care – PPO

## 2021-12-02 DIAGNOSIS — U071 COVID-19: Secondary | ICD-10-CM | POA: Diagnosis not present

## 2021-12-02 DIAGNOSIS — J9620 Acute and chronic respiratory failure, unspecified whether with hypoxia or hypercapnia: Secondary | ICD-10-CM | POA: Diagnosis not present

## 2021-12-02 DIAGNOSIS — Z4682 Encounter for fitting and adjustment of non-vascular catheter: Secondary | ICD-10-CM | POA: Diagnosis not present

## 2021-12-02 DIAGNOSIS — F419 Anxiety disorder, unspecified: Secondary | ICD-10-CM | POA: Diagnosis not present

## 2021-12-02 DIAGNOSIS — I10 Essential (primary) hypertension: Secondary | ICD-10-CM | POA: Diagnosis not present

## 2021-12-02 DIAGNOSIS — J189 Pneumonia, unspecified organism: Secondary | ICD-10-CM | POA: Diagnosis not present

## 2021-12-02 DIAGNOSIS — J9621 Acute and chronic respiratory failure with hypoxia: Secondary | ICD-10-CM | POA: Diagnosis not present

## 2021-12-02 DIAGNOSIS — J449 Chronic obstructive pulmonary disease, unspecified: Secondary | ICD-10-CM | POA: Diagnosis not present

## 2021-12-02 DIAGNOSIS — A419 Sepsis, unspecified organism: Secondary | ICD-10-CM | POA: Diagnosis not present

## 2021-12-02 DIAGNOSIS — R6520 Severe sepsis without septic shock: Secondary | ICD-10-CM | POA: Diagnosis not present

## 2021-12-02 LAB — CBC
HCT: 27.2 % — ABNORMAL LOW (ref 39.0–52.0)
Hemoglobin: 8 g/dL — ABNORMAL LOW (ref 13.0–17.0)
MCH: 25.7 pg — ABNORMAL LOW (ref 26.0–34.0)
MCHC: 29.4 g/dL — ABNORMAL LOW (ref 30.0–36.0)
MCV: 87.5 fL (ref 80.0–100.0)
Platelets: 252 10*3/uL (ref 150–400)
RBC: 3.11 MIL/uL — ABNORMAL LOW (ref 4.22–5.81)
RDW: 16 % — ABNORMAL HIGH (ref 11.5–15.5)
WBC: 8.5 10*3/uL (ref 4.0–10.5)
nRBC: 0 % (ref 0.0–0.2)

## 2021-12-02 LAB — BASIC METABOLIC PANEL
Anion gap: 5 (ref 5–15)
BUN: 24 mg/dL — ABNORMAL HIGH (ref 8–23)
CO2: 34 mmol/L — ABNORMAL HIGH (ref 22–32)
Calcium: 8.2 mg/dL — ABNORMAL LOW (ref 8.9–10.3)
Chloride: 100 mmol/L (ref 98–111)
Creatinine, Ser: 0.68 mg/dL (ref 0.61–1.24)
GFR, Estimated: >60 mL/min (ref >60)
Glucose, Bld: 95 mg/dL (ref 70–99)
Potassium: 4.5 mmol/L (ref 3.5–5.1)
Sodium: 139 mmol/L (ref 135–145)

## 2021-12-02 LAB — URINALYSIS, ROUTINE W REFLEX MICROSCOPIC
Bilirubin Urine: NEGATIVE
Glucose, UA: NEGATIVE mg/dL
Hgb urine dipstick: NEGATIVE
Ketones, ur: NEGATIVE mg/dL
Leukocytes,Ua: NEGATIVE
Nitrite: NEGATIVE
Protein, ur: NEGATIVE mg/dL
Specific Gravity, Urine: 1.01 (ref 1.005–1.030)
pH: 8 (ref 5.0–8.0)

## 2021-12-02 LAB — MAGNESIUM: Magnesium: 2.3 mg/dL (ref 1.7–2.4)

## 2021-12-02 LAB — PHOSPHORUS: Phosphorus: 4 mg/dL (ref 2.5–4.6)

## 2021-12-02 NOTE — Progress Notes (Signed)
Pulmonary Critical Care Medicine [place]   PULMONARY CRITICAL CARE SERVICE  PROGRESS NOTE     MALE MINISH  HBZ:[id number]  DOB: 1956/10/24   DOA: 11/15/2021  Referring Physician: Satira Sark, MD  HPI: EMIN FOREE is a 66 y.o. male being followed for ventilator/airway/oxygen weaning Acute on Chronic Respiratory Failure.  Patient is comfortable without distress at this time has been on assist control mode attempted on T collar but did not tolerate  Medications: Reviewed on Rounds  Physical Exam:  Vitals: Temperature is 97.2 pulse 90 respiratory 22 blood pressure is 102/65  Ventilator Settings on assist control FiO2 is 35% tidal volume 595 PEEP 5  General: Comfortable at this time Neck: supple Cardiovascular: no malignant arrhythmias Respiratory: Coarse breath sounds with few scattered rhonchi Skin: no rash seen on limited exam Musculoskeletal: No gross abnormality Psychiatric:unable to assess Neurologic:no involuntary movements         Lab Data:   Basic Metabolic Panel: Recent Labs  Lab 11/27/21 0645 11/29/21 0440 11/29/21 2301 11/30/21 0410 12/02/21 0434  NA 136 138  --  139 139  K 4.9 5.2* 4.5 4.4 4.5  CL 98 97*  --  99 100  CO2 33* 33*  --  34* 34*  GLUCOSE 141* 242*  --  126* 95  BUN 23 22  --  23 24*  CREATININE 0.77 0.80  --  0.80 0.68  CALCIUM 7.8* 8.5*  --  7.9* 8.2*  MG  --  2.3  --   --  2.3  PHOS  --  5.6*  --   --  4.0    ABG: No results for input(s): PHART, PCO2ART, PO2ART, HCO3, O2SAT in the last 168 hours.  Liver Function Tests: No results for input(s): AST, ALT, ALKPHOS, BILITOT, PROT, ALBUMIN in the last 168 hours. No results for input(s): LIPASE, AMYLASE in the last 168 hours. No results for input(s): AMMONIA in the last 168 hours.  CBC: Recent Labs  Lab 11/27/21 0645 11/29/21 0440 12/02/21 0434  WBC 7.7 7.2 8.5  HGB 8.3* 8.6* 8.0*  HCT 27.8* 30.0* 27.2*  MCV 86.6 87.7 87.5  PLT 150 190  252    Cardiac Enzymes: No results for input(s): CKTOTAL, CKMB, CKMBINDEX, TROPONINI in the last 168 hours.  BNP (last 3 results) Recent Labs    12/08/20 0100 03/04/21 1555 08/21/21 0504  BNP 146.1* 93.2 142.9*    ProBNP (last 3 results) No results for input(s): PROBNP in the last 8760 hours.  Radiological Exams: No results found.  Assessment/Plan Active Problems:   COPD, severe (HCC)   Acute on chronic respiratory failure with hypoxia (HCC)   HCAP (healthcare-associated pneumonia)   COVID-19   Severe sepsis (HCC)   Acute on chronic respiratory failure with hypoxia plan is going to be to continue with the full support on the ventilator patient's not been tolerating weaning.  We will continue with secretion management pulmonary toilet. Healthcare associated pneumonia has been treated we will continue to follow along closely prognosis guarded COVID-19 virus infection in recovery Severe sepsis resolved hemodynamics are stable at this time Severe COPD medical management   I have personally seen and evaluated the patient, evaluated laboratory and imaging results, formulated the assessment and plan and placed orders. The Patient requires high complexity decision making with multiple systems involvement.  Rounds were done with the Respiratory Therapy Director and Staff therapists and discussed with nursing staff also.  Allyne Gee, MD [facility] Pulmonary Critical Care  Medicine Sleep Medicine

## 2021-12-03 ENCOUNTER — Other Ambulatory Visit (HOSPITAL_COMMUNITY): Payer: BC Managed Care – PPO

## 2021-12-03 DIAGNOSIS — J9621 Acute and chronic respiratory failure with hypoxia: Secondary | ICD-10-CM | POA: Diagnosis not present

## 2021-12-03 DIAGNOSIS — I700 Atherosclerosis of aorta: Secondary | ICD-10-CM | POA: Diagnosis not present

## 2021-12-03 DIAGNOSIS — R6520 Severe sepsis without septic shock: Secondary | ICD-10-CM | POA: Diagnosis not present

## 2021-12-03 DIAGNOSIS — J189 Pneumonia, unspecified organism: Secondary | ICD-10-CM | POA: Diagnosis not present

## 2021-12-03 DIAGNOSIS — A419 Sepsis, unspecified organism: Secondary | ICD-10-CM | POA: Diagnosis not present

## 2021-12-03 DIAGNOSIS — D3500 Benign neoplasm of unspecified adrenal gland: Secondary | ICD-10-CM | POA: Diagnosis not present

## 2021-12-03 DIAGNOSIS — U071 COVID-19: Secondary | ICD-10-CM | POA: Diagnosis not present

## 2021-12-03 DIAGNOSIS — J90 Pleural effusion, not elsewhere classified: Secondary | ICD-10-CM | POA: Diagnosis not present

## 2021-12-03 DIAGNOSIS — J449 Chronic obstructive pulmonary disease, unspecified: Secondary | ICD-10-CM | POA: Diagnosis not present

## 2021-12-03 DIAGNOSIS — I10 Essential (primary) hypertension: Secondary | ICD-10-CM | POA: Diagnosis not present

## 2021-12-03 DIAGNOSIS — J9620 Acute and chronic respiratory failure, unspecified whether with hypoxia or hypercapnia: Secondary | ICD-10-CM | POA: Diagnosis not present

## 2021-12-03 DIAGNOSIS — K5900 Constipation, unspecified: Secondary | ICD-10-CM | POA: Diagnosis not present

## 2021-12-03 DIAGNOSIS — D638 Anemia in other chronic diseases classified elsewhere: Secondary | ICD-10-CM | POA: Diagnosis not present

## 2021-12-03 LAB — CBC
HCT: 28 % — ABNORMAL LOW (ref 39.0–52.0)
Hemoglobin: 8.3 g/dL — ABNORMAL LOW (ref 13.0–17.0)
MCH: 25.9 pg — ABNORMAL LOW (ref 26.0–34.0)
MCHC: 29.6 g/dL — ABNORMAL LOW (ref 30.0–36.0)
MCV: 87.2 fL (ref 80.0–100.0)
Platelets: 283 10*3/uL (ref 150–400)
RBC: 3.21 MIL/uL — ABNORMAL LOW (ref 4.22–5.81)
RDW: 16 % — ABNORMAL HIGH (ref 11.5–15.5)
WBC: 8.5 10*3/uL (ref 4.0–10.5)
nRBC: 0 % (ref 0.0–0.2)

## 2021-12-03 LAB — URINE CULTURE: Culture: <10000 — AB

## 2021-12-03 NOTE — Progress Notes (Signed)
Pulmonary Critical Care Medicine [place]   PULMONARY CRITICAL CARE SERVICE  PROGRESS NOTE     Isaac [name]  DVV:[id number]  DOB: 18-Dec-1955   DOA: 11/15/2021  Referring Physician: Satira Sark, MD  HPI: Isaac [name] is a 66 y.o. male being followed for ventilator/airway/oxygen weaning Acute on Chronic Respiratory Failure.  Patient is afebrile resting comfortably without distress at this time has been on the T collar on 40% FiO2  Medications: Reviewed on Rounds  Physical Exam:  Vitals: Temperature is 96.8 pulse 100 respiratory 24 blood pressure is 129/68 saturations 98%  Ventilator Settings on T collar FiO2 40%  General: Comfortable at this time Neck: supple Cardiovascular: no malignant arrhythmias Respiratory: No rhonchi no rales Skin: no rash seen on limited exam Musculoskeletal: No gross abnormality Psychiatric:unable to assess Neurologic:no involuntary movements         Lab Data:   Basic Metabolic Panel: Recent Labs  Lab 11/27/21 0645 11/29/21 0440 11/29/21 2301 11/30/21 0410 12/02/21 0434  NA 136 138  --  139 139  K 4.9 5.2* 4.5 4.4 4.5  CL 98 97*  --  99 100  CO2 33* 33*  --  34* 34*  GLUCOSE 141* 242*  --  126* 95  BUN 23 22  --  23 24*  CREATININE 0.77 0.80  --  0.80 0.68  CALCIUM 7.8* 8.5*  --  7.9* 8.2*  MG  --  2.3  --   --  2.3  PHOS  --  5.6*  --   --  4.0    ABG: No results for input(s): PHART, PCO2ART, PO2ART, HCO3, O2SAT in the last 168 hours.  Liver Function Tests: No results for input(s): AST, ALT, ALKPHOS, BILITOT, PROT, ALBUMIN in the last 168 hours. No results for input(s): LIPASE, AMYLASE in the last 168 hours. No results for input(s): AMMONIA in the last 168 hours.  CBC: Recent Labs  Lab 11/27/21 0645 11/29/21 0440 12/02/21 0434 12/03/21 0452  WBC 7.7 7.2 8.5 8.5  HGB 8.3* 8.6* 8.0* 8.3*  HCT 27.8* 30.0* 27.2* 28.0*  MCV 86.6 87.7 87.5 87.2  PLT 150 190 252 283    Cardiac  Enzymes: No results for input(s): CKTOTAL, CKMB, CKMBINDEX, TROPONINI in the last 168 hours.  BNP (last 3 results) Recent Labs    12/08/20 0100 03/04/21 1555 08/21/21 0504  BNP 146.1* 93.2 142.9*    ProBNP (last 3 results) No results for input(s): PROBNP in the last 8760 hours.  Radiological Exams: DG Abd 1 View  Result Date: 12/02/2021 CLINICAL DATA:  66 year old male NG tube placement. EXAM: ABDOMEN - 1 VIEW COMPARISON:  11/15/2021 and earlier. FINDINGS: Portable AP semi upright view at 1112 hours. Enteric feeding tube placed into the stomach. Tip is at the distal gastric body on image #2, approximately 3 cm to the left of midline. Ongoing opacified left lung base. Negative visible bowel gas pattern. No acute osseous abnormality identified. IMPRESSION: Enteric feeding tube placed into the stomach, tip at the distal gastric body. Advance at least 6 cm to allow for post pyloric transit if desired. Electronically Signed   By: Genevie Ann M.D.   On: 12/02/2021 11:49   DG Chest Port 1 View  Result Date: 12/03/2021 CLINICAL DATA:  Bibasilar airspace disease. EXAM: PORTABLE CHEST 1 VIEW COMPARISON:  11/29/2021 FINDINGS: Lungs are hyperexpanded. Cardiopericardial silhouette is at upper limits of normal for size. Bibasilar atelectasis/infiltrate is similar to prior with persistent small left pleural effusion. Tracheostomy tube  again noted. A feeding tube passes into the stomach although the distal tip position is not included on the film. Telemetry leads overlie the chest. IMPRESSION: No substantial change. Bibasilar atelectasis/infiltrate with small left pleural effusion. Electronically Signed   By: Misty Stanley M.D.   On: 12/03/2021 06:53    Assessment/Plan Active Problems:   COPD, severe (HCC)   Acute on chronic respiratory failure with hypoxia (HCC)   HCAP (healthcare-associated pneumonia)   COVID-19   Severe sepsis (HCC)   Acute on chronic respiratory failure hypoxia plan is to continue  with T-piece patient's goal for 16 hours. Severe COPD medical management we will continue to monitor closely. Healthcare associated pneumonia has been treated continue with supportive care COVID-19 virus infection in recovery we will continue to follow along Severe sepsis resolved hemodynamics are stable   I have personally seen and evaluated the patient, evaluated laboratory and imaging results, formulated the assessment and plan and placed orders. The Patient requires high complexity decision making with multiple systems involvement.  Rounds were done with the Respiratory Therapy Director and Staff therapists and discussed with nursing staff also.  Allyne Gee, MD [facility] Pulmonary Critical Care Medicine Sleep Medicine

## 2021-12-04 DIAGNOSIS — J189 Pneumonia, unspecified organism: Secondary | ICD-10-CM | POA: Diagnosis not present

## 2021-12-04 DIAGNOSIS — J449 Chronic obstructive pulmonary disease, unspecified: Secondary | ICD-10-CM | POA: Diagnosis not present

## 2021-12-04 DIAGNOSIS — J9621 Acute and chronic respiratory failure with hypoxia: Secondary | ICD-10-CM | POA: Diagnosis not present

## 2021-12-04 DIAGNOSIS — A419 Sepsis, unspecified organism: Secondary | ICD-10-CM | POA: Diagnosis not present

## 2021-12-04 DIAGNOSIS — J9620 Acute and chronic respiratory failure, unspecified whether with hypoxia or hypercapnia: Secondary | ICD-10-CM | POA: Diagnosis not present

## 2021-12-04 DIAGNOSIS — R6520 Severe sepsis without septic shock: Secondary | ICD-10-CM | POA: Diagnosis not present

## 2021-12-04 DIAGNOSIS — D638 Anemia in other chronic diseases classified elsewhere: Secondary | ICD-10-CM | POA: Diagnosis not present

## 2021-12-04 DIAGNOSIS — U071 COVID-19: Secondary | ICD-10-CM | POA: Diagnosis not present

## 2021-12-04 DIAGNOSIS — I10 Essential (primary) hypertension: Secondary | ICD-10-CM | POA: Diagnosis not present

## 2021-12-04 LAB — PHOSPHORUS: Phosphorus: 4.7 mg/dL — ABNORMAL HIGH (ref 2.5–4.6)

## 2021-12-04 LAB — CBC
HCT: 28.9 % — ABNORMAL LOW (ref 39.0–52.0)
Hemoglobin: 8.4 g/dL — ABNORMAL LOW (ref 13.0–17.0)
MCH: 25.2 pg — ABNORMAL LOW (ref 26.0–34.0)
MCHC: 29.1 g/dL — ABNORMAL LOW (ref 30.0–36.0)
MCV: 86.8 fL (ref 80.0–100.0)
Platelets: 281 10*3/uL (ref 150–400)
RBC: 3.33 MIL/uL — ABNORMAL LOW (ref 4.22–5.81)
RDW: 15.9 % — ABNORMAL HIGH (ref 11.5–15.5)
WBC: 8.1 10*3/uL (ref 4.0–10.5)
nRBC: 0 % (ref 0.0–0.2)

## 2021-12-04 LAB — CULTURE, RESPIRATORY W GRAM STAIN

## 2021-12-04 LAB — BASIC METABOLIC PANEL
Anion gap: 8 (ref 5–15)
BUN: 25 mg/dL — ABNORMAL HIGH (ref 8–23)
CO2: 33 mmol/L — ABNORMAL HIGH (ref 22–32)
Calcium: 8 mg/dL — ABNORMAL LOW (ref 8.9–10.3)
Chloride: 98 mmol/L (ref 98–111)
Creatinine, Ser: 0.91 mg/dL (ref 0.61–1.24)
GFR, Estimated: >60 mL/min (ref >60)
Glucose, Bld: 129 mg/dL — ABNORMAL HIGH (ref 70–99)
Potassium: 4.2 mmol/L (ref 3.5–5.1)
Sodium: 139 mmol/L (ref 135–145)

## 2021-12-04 LAB — PROTIME-INR
INR: 1.1 (ref 0.8–1.2)
Prothrombin Time: 14 seconds (ref 11.4–15.2)

## 2021-12-04 LAB — MAGNESIUM: Magnesium: 2.5 mg/dL — ABNORMAL HIGH (ref 1.7–2.4)

## 2021-12-04 NOTE — Progress Notes (Signed)
Pulmonary Critical Care Medicine [place]   PULMONARY CRITICAL CARE SERVICE  PROGRESS NOTE     Isaac [name]  NTI:[id number]  DOB: 07/16/1956   DOA: 11/15/2021  Referring Physician: Satira Sark, MD  HPI: Isaac [name] is a 66 y.o. male being followed for ventilator/airway/oxygen weaning Acute on Chronic Respiratory Failure.  Patient is on the ventilator and full support has been on 35% FiO2 saturations are good  Medications: Reviewed on Rounds  Physical Exam:  Vitals: Temperature 97 pulse 90 respiratory 22 blood pressure is 90/60 saturations 95%  Ventilator Settings on assist control FiO2 is 35% tidal volume is 583 PEEP of 5  General: Comfortable at this time Neck: supple Cardiovascular: no malignant arrhythmias Respiratory: Scattered rhonchi expansion is equal Skin: no rash seen on limited exam Musculoskeletal: No gross abnormality Psychiatric:unable to assess Neurologic:no involuntary movements         Lab Data:   Basic Metabolic Panel: Recent Labs  Lab 11/29/21 0440 11/29/21 2301 11/30/21 0410 12/02/21 0434 12/04/21 0601  NA 138  --  139 139 139  K 5.2* 4.5 4.4 4.5 4.2  CL 97*  --  99 100 98  CO2 33*  --  34* 34* 33*  GLUCOSE 242*  --  126* 95 129*  BUN 22  --  23 24* 25*  CREATININE 0.80  --  0.80 0.68 0.91  CALCIUM 8.5*  --  7.9* 8.2* 8.0*  MG 2.3  --   --  2.3 2.5*  PHOS 5.6*  --   --  4.0 4.7*    ABG: No results for input(s): PHART, PCO2ART, PO2ART, HCO3, O2SAT in the last 168 hours.  Liver Function Tests: No results for input(s): AST, ALT, ALKPHOS, BILITOT, PROT, ALBUMIN in the last 168 hours. No results for input(s): LIPASE, AMYLASE in the last 168 hours. No results for input(s): AMMONIA in the last 168 hours.  CBC: Recent Labs  Lab 11/29/21 0440 12/02/21 0434 12/03/21 0452 12/04/21 0601  WBC 7.2 8.5 8.5 8.1  HGB 8.6* 8.0* 8.3* 8.4*  HCT 30.0* 27.2* 28.0* 28.9*  MCV 87.7 87.5 87.2 86.8  PLT 190 252  283 281    Cardiac Enzymes: No results for input(s): CKTOTAL, CKMB, CKMBINDEX, TROPONINI in the last 168 hours.  BNP (last 3 results) Recent Labs    12/08/20 0100 03/04/21 1555 08/21/21 0504  BNP 146.1* 93.2 142.9*    ProBNP (last 3 results) No results for input(s): PROBNP in the last 8760 hours.  Radiological Exams: CT ABDOMEN WO CONTRAST  Result Date: 12/04/2021 CLINICAL DATA:  Evaluate gastric anatomy prior to potential percutaneous gastrostomy tube placement. EXAM: CT ABDOMEN WITHOUT CONTRAST TECHNIQUE: Multidetector CT imaging of the abdomen was performed following the standard protocol without IV contrast. COMPARISON:  None. FINDINGS: Lower chest: Limited visualization of the lower thorax demonstrates trace bilateral effusions with associated bibasilar consolidative opacities and associated air bronchograms, left greater than right. Cardiomegaly. Trace amount of pericardial fluid, presumably physiologic. Hepatobiliary: Normal hepatic contour. There is an approximately 5.3 cm hypoattenuating lesion within the left lobe of the liver (axial image 14, series 3), incompletely evaluated on this noncontrast examination and while minimally increased in size compared to remote examination performed in 2009 previously measuring 3.1 cm, favored to represent a hepatic cyst. Normal noncontrast appearance of the gallbladder given degree distention. No radiopaque gallstones. No intra or extrahepatic biliary dilatation. Pancreas: Normal noncontrast appearance of the pancreas. Spleen: The spleen is enlarged measuring 17.3 cm in length (image  36, series 3). Adrenals/Urinary Tract: Normal noncontrast appearance the bilateral kidneys. No renal stones. There is a minimal amount of grossly symmetric likely age and body habitus related perinephric stranding. No urinary obstruction. Redemonstrated approximately 4.0 x 3.0 cm macroscopic fat containing left-sided adrenal myelolipoma (image 26, series 3, similar to  chest CT performed 02/2021. Grossly unchanged approximately 1.2 cm right-sided adrenal adenoma (image 21, series 3). The urinary bladder was not imaged. Stomach/Bowel: The anterior wall the stomach is well apposed against the ventral wall of the upper abdomen and would likely be improved with gastric insufflation. Enteric tube tip terminates within the gastric antrum. No significant hiatal hernia. Moderate colonic stool burden without evidence of enteric obstruction. No pneumoperitoneum, pneumatosis or portal venous gas. Vascular/Lymphatic: Atherosclerotic plaque within a normal caliber abdominal aorta. Known retroperitoneal and mesenteric lymphadenopathy persists though appears slightly improved within the upper abdomen compared to previous examinations with index gastrohepatic ligament lymph node now measuring 1.3 cm in greatest short axis diameter (image 21, series 3), previously, 2.1 cm. Index left-sided periaortic lymph node measures 1.7 cm in greatest short axis diameter (image 49, series 3 and index left sided pelvic wall lymph node measures 1.9 cm (image 66, series 3). Other: Diffuse body wall anasarca. Musculoskeletal: No acute or aggressive osseous abnormalities. IMPRESSION: 1. Gastric anatomy amenable to attempted percutaneous gastrostomy tube placement as indicated. 2. Redemonstrated though potentially improved abdominal adenopathy compatible with known history of CLL. 3. Trace bilateral effusions with associated bibasilar consolidative opacities and associated air bronchograms, left greater than right, atelectasis versus infiltrate. 4. Cardiomegaly. 5. Aortic Atherosclerosis (ICD10-I70.0). Electronically Signed   By: Sandi Mariscal M.D.   On: 12/04/2021 07:45   DG Abd 1 View  Result Date: 12/02/2021 CLINICAL DATA:  66 year old male NG tube placement. EXAM: ABDOMEN - 1 VIEW COMPARISON:  11/15/2021 and earlier. FINDINGS: Portable AP semi upright view at 1112 hours. Enteric feeding tube placed into the  stomach. Tip is at the distal gastric body on image #2, approximately 3 cm to the left of midline. Ongoing opacified left lung base. Negative visible bowel gas pattern. No acute osseous abnormality identified. IMPRESSION: Enteric feeding tube placed into the stomach, tip at the distal gastric body. Advance at least 6 cm to allow for post pyloric transit if desired. Electronically Signed   By: Genevie Ann M.D.   On: 12/02/2021 11:49   DG Chest Port 1 View  Result Date: 12/03/2021 CLINICAL DATA:  Bibasilar airspace disease. EXAM: PORTABLE CHEST 1 VIEW COMPARISON:  11/29/2021 FINDINGS: Lungs are hyperexpanded. Cardiopericardial silhouette is at upper limits of normal for size. Bibasilar atelectasis/infiltrate is similar to prior with persistent small left pleural effusion. Tracheostomy tube again noted. A feeding tube passes into the stomach although the distal tip position is not included on the film. Telemetry leads overlie the chest. IMPRESSION: No substantial change. Bibasilar atelectasis/infiltrate with small left pleural effusion. Electronically Signed   By: Misty Stanley M.D.   On: 12/03/2021 06:53    Assessment/Plan Active Problems:   COPD, severe (HCC)   Acute on chronic respiratory failure with hypoxia (HCC)   HCAP (healthcare-associated pneumonia)   COVID-19   Severe sepsis (HCC)   Acute on chronic respiratory failure hypoxia patient is on assist control mode on 35% FiO2 patient's tidal volume is 583 PEEP 5 Severe COPD medical management we will continue to follow along closely. Healthcare associated pneumonia supportive care we will continue present therapy patient has slow improvement last chest x-ray really no major changes COVID-19 virus  infection in recovery Severe sepsis treated and resolved hemodynamics are stable   I have personally seen and evaluated the patient, evaluated laboratory and imaging results, formulated the assessment and plan and placed orders. The Patient requires  high complexity decision making with multiple systems involvement.  Rounds were done with the Respiratory Therapy Director and Staff therapists and discussed with nursing staff also.  Allyne Gee, MD [facility] Pulmonary Critical Care Medicine Sleep Medicine

## 2021-12-04 NOTE — Progress Notes (Signed)
° ° °  Risks and benefits image guided gastrostomy tube placement was discussed with the patient's wife Tobin Chad via phone including, but not limited to the need for a barium enema during the procedure, bleeding, infection, peritonitis and/or damage to adjacent structures.  All questions were answered, she is agreeable to proceed.  Consent signed and in chart.   Orders placed for G tube 1/10 in IR

## 2021-12-04 NOTE — Consult Note (Signed)
Chief Complaint: Patient was seen in consultation today for percutaneous gastric tube placement at the request of Drr Saul Fordyce   Supervising Physician: Sandi Mariscal  Patient Status: [facility]  History of Present Illness: Isaac [name] is a 66 y.o. male   Hx CLL 2019; HTN; anxiety; GERD  Transferred to [facility] for weaning management Post Covid infection Resp failure Now better -- weaning On vent + trach 11/09/21 Dysphagia Poor nutrition Non healing wound Need for long term care Request for percutaneous gastric tube placement  Dr Pascal Lux has reviewed imaging and approves procedure Lovenox held    Past Medical History:  Diagnosis Date   Acute on chronic respiratory failure with hypoxia ([place])    Anxiety    Chronic lymphocytic leukemia in remission ([place])    COPD (chronic obstructive pulmonary disease) (HCC)    COPD, severe ([place])    GERD (gastroesophageal reflux disease)    Hematuria    Hyperlipidemia    Hypertension    Insomnia    Left lower lobe pneumonia    Nephrolithiasis    Tobacco dependence     Past Surgical History:  Procedure Laterality Date   LITHOTRIPSY      Allergies: Codeine and Prozac [fluoxetine hcl]  Medications: Prior to Admission medications   Medication Sig Start Date End Date Taking? Authorizing Provider  albuterol (PROVENTIL) (2.5 MG/3ML) 0.083% nebulizer solution Take 3 mLs (2.5 mg total) by nebulization every 4 (four) hours as needed for shortness of breath or wheezing. 11/15/21   Arnell Asal, NP  antiseptic oral rinse (BIOTENE) LIQD 15 mLs by Mouth Rinse route as needed for dry mouth. 11/15/21   Arnell Asal, NP  arformoterol (BROVANA) 15 MCG/2ML NEBU Take 2 mLs (15 mcg total) by nebulization 2 (two) times daily. 11/15/21   Arnell Asal, NP  budesonide (PULMICORT) 0.5 MG/2ML nebulizer solution Take 2 mLs (0.5 mg total) by nebulization 2 (two) times daily. 11/15/21   Arnell Asal, NP  chlorhexidine gluconate, MEDLINE  KIT, (PERIDEX) 0.12 % solution 15 mLs by Mouth Rinse route 2 (two) times daily. 11/15/21   Arnell Asal, NP  enoxaparin (LOVENOX) 40 MG/0.4ML injection Inject 0.4 mLs (40 mg total) into the skin daily. 11/15/21   Arnell Asal, NP  feeding supplement (ENSURE ENLIVE / ENSURE PLUS) LIQD Take 237 mLs by mouth 3 (three) times daily between meals. 11/15/21   Arnell Asal, NP  gabapentin (NEURONTIN) 250 MG/5ML solution Place 2 mLs (100 mg total) into feeding tube daily. 11/16/21   Arnell Asal, NP  hydrALAZINE (APRESOLINE) 20 MG/ML injection Inject 0.5 mLs (10 mg total) into the vein every 6 (six) hours as needed (SBP >170). 11/15/21   Arnell Asal, NP  hydrOXYzine (ATARAX) 10 MG/5ML syrup Place 25 mLs (50 mg total) into feeding tube 3 (three) times daily as needed for itching or anxiety. 11/15/21   Arnell Asal, NP  metoprolol tartrate (LOPRESSOR) 25 MG tablet Place 1 tablet (25 mg total) into feeding tube every 6 (six) hours. 11/15/21   Arnell Asal, NP  Nutritional Supplements (FEEDING SUPPLEMENT, OSMOLITE 1.2 CAL,) LIQD Place 1,000 mLs into feeding tube continuous. 11/15/21   Arnell Asal, NP  Nutritional Supplements (FEEDING SUPPLEMENT, PROSOURCE TF,) liquid Place 45 mLs into feeding tube 4 (four) times daily. 11/15/21   Arnell Asal, NP  oxyCODONE 10 MG TABS Place 1 tablet (10 mg total) into feeding tube every 6 (six) hours as needed for severe pain.  11/15/21   Arnell Asal, NP  pantoprazole sodium (PROTONIX) 40 mg Place 40 mg into feeding tube at bedtime. 11/15/21   Arnell Asal, NP  polyethylene glycol (MIRALAX / GLYCOLAX) 17 g packet Place 17 g into feeding tube daily as needed for mild constipation. 11/15/21   Arnell Asal, NP  revefenacin (YUPELRI) 175 MCG/3ML nebulizer solution Take 3 mLs (175 mcg total) by nebulization daily. 11/16/21   Arnell Asal, NP  Water For Irrigation, Sterile (FREE WATER) SOLN Place 200 mLs into feeding tube every 8  (eight) hours. 11/15/21   Arnell Asal, NP     Family History  Problem Relation Age of Onset   Heart disease Father    Stroke Mother    Leukemia Brother     Social History   Socioeconomic History   Marital status: Married    Spouse name: Not on file   Number of children: Not on file   Years of education: Not on file   Highest education level: Not on file  Occupational History   Occupation: unemployed  Tobacco Use   Smoking status: Former    Packs/day: 1.00    Years: 35.00    Pack years: 35.00    Types: Cigarettes    Quit date: 12/07/2008    Years since quitting: 13.0   Smokeless tobacco: Never  Vaping Use   Vaping Use: Never used  Substance and Sexual Activity   Alcohol use: Not on file   Drug use: Not on file   Sexual activity: Yes  Other Topics Concern   Not on file  Social History Narrative   Not on file   Social Determinants of Health   Financial Resource Strain: Not on file  Food Insecurity: Not on file  Transportation Needs: Not on file  Physical Activity: Not on file  Stress: Not on file  Social Connections: Not on file     Review of Systems: A 12 point ROS discussed and pertinent positives are indicated in the HPI above.  All other systems are negative.    Vital Signs: There were no vitals taken for this visit.  Physical Exam Vitals reviewed.  Constitutional:      Appearance: He is ill-appearing.     Comments: No response Trach Vent   HENT:     Mouth/Throat:     Mouth: Mucous membranes are moist.  Cardiovascular:     Rate and Rhythm: Normal rate and regular rhythm.  Pulmonary:     Comments: Trach Vent  Abdominal:     Palpations: Abdomen is soft.  Skin:    General: Skin is warm.  Psychiatric:     Comments: Spoke to wife Germaine via phone She will discuss with MD I will call again later for consent    Imaging: CT ABDOMEN WO CONTRAST  Result Date: 12/04/2021 CLINICAL DATA:  Evaluate gastric anatomy prior to potential  percutaneous gastrostomy tube placement. EXAM: CT ABDOMEN WITHOUT CONTRAST TECHNIQUE: Multidetector CT imaging of the abdomen was performed following the standard protocol without IV contrast. COMPARISON:  None. FINDINGS: Lower chest: Limited visualization of the lower thorax demonstrates trace bilateral effusions with associated bibasilar consolidative opacities and associated air bronchograms, left greater than right. Cardiomegaly. Trace amount of pericardial fluid, presumably physiologic. Hepatobiliary: Normal hepatic contour. There is an approximately 5.3 cm hypoattenuating lesion within the left lobe of the liver (axial image 14, series 3), incompletely evaluated on this noncontrast examination and while minimally increased in size compared to remote examination  performed in 2009 previously measuring 3.1 cm, favored to represent a hepatic cyst. Normal noncontrast appearance of the gallbladder given degree distention. No radiopaque gallstones. No intra or extrahepatic biliary dilatation. Pancreas: Normal noncontrast appearance of the pancreas. Spleen: The spleen is enlarged measuring 17.3 cm in length (image 36, series 3). Adrenals/Urinary Tract: Normal noncontrast appearance the bilateral kidneys. No renal stones. There is a minimal amount of grossly symmetric likely age and body habitus related perinephric stranding. No urinary obstruction. Redemonstrated approximately 4.0 x 3.0 cm macroscopic fat containing left-sided adrenal myelolipoma (image 26, series 3, similar to chest CT performed 02/2021. Grossly unchanged approximately 1.2 cm right-sided adrenal adenoma (image 21, series 3). The urinary bladder was not imaged. Stomach/Bowel: The anterior wall the stomach is well apposed against the ventral wall of the upper abdomen and would likely be improved with gastric insufflation. Enteric tube tip terminates within the gastric antrum. No significant hiatal hernia. Moderate colonic stool burden without evidence  of enteric obstruction. No pneumoperitoneum, pneumatosis or portal venous gas. Vascular/Lymphatic: Atherosclerotic plaque within a normal caliber abdominal aorta. Known retroperitoneal and mesenteric lymphadenopathy persists though appears slightly improved within the upper abdomen compared to previous examinations with index gastrohepatic ligament lymph node now measuring 1.3 cm in greatest short axis diameter (image 21, series 3), previously, 2.1 cm. Index left-sided periaortic lymph node measures 1.7 cm in greatest short axis diameter (image 49, series 3 and index left sided pelvic wall lymph node measures 1.9 cm (image 66, series 3). Other: Diffuse body wall anasarca. Musculoskeletal: No acute or aggressive osseous abnormalities. IMPRESSION: 1. Gastric anatomy amenable to attempted percutaneous gastrostomy tube placement as indicated. 2. Redemonstrated though potentially improved abdominal adenopathy compatible with known history of CLL. 3. Trace bilateral effusions with associated bibasilar consolidative opacities and associated air bronchograms, left greater than right, atelectasis versus infiltrate. 4. Cardiomegaly. 5. Aortic Atherosclerosis (ICD10-I70.0). Electronically Signed   By: Sandi Mariscal M.D.   On: 12/04/2021 07:45   DG Chest 1 View  Result Date: 11/12/2021 CLINICAL DATA:  Hypoxia.  Follow-up exam. EXAM: CHEST  1 VIEW COMPARISON:  11/10/2021 and older studies. FINDINGS: Left lung base opacity obscures the hemidiaphragm. Small stable focus of opacity in the left upper lung. Suspect small left pleural effusion. No convincing right pleural effusion. No pneumothorax. Stable well-positioned tracheostomy tube. New enteric tube passes below the diaphragm. IMPRESSION: 1. Left lung base opacity consistent with atelectasis and/or pneumonia with a small effusion. This is more apparent than it was on the previous exam. 2. No other evidence of acute cardiopulmonary disease. Electronically Signed   By: Lajean Manes M.D.   On: 11/12/2021 13:04   DG Abd 1 View  Result Date: 12/02/2021 CLINICAL DATA:  66 year old male NG tube placement. EXAM: ABDOMEN - 1 VIEW COMPARISON:  11/15/2021 and earlier. FINDINGS: Portable AP semi upright view at 1112 hours. Enteric feeding tube placed into the stomach. Tip is at the distal gastric body on image #2, approximately 3 cm to the left of midline. Ongoing opacified left lung base. Negative visible bowel gas pattern. No acute osseous abnormality identified. IMPRESSION: Enteric feeding tube placed into the stomach, tip at the distal gastric body. Advance at least 6 cm to allow for post pyloric transit if desired. Electronically Signed   By: Genevie Ann M.D.   On: 12/02/2021 11:49   DG Abd 1 View  Result Date: 11/06/2021 CLINICAL DATA:  Enteric catheter placement EXAM: ABDOMEN - 1 VIEW COMPARISON:  12/29/2020 FINDINGS: Frontal view of the  lower chest and upper abdomen demonstrates enteric catheter passing below diaphragm tip and side port projecting over gastric antrum. Visualized bowel gas pattern is unremarkable. Patchy bibasilar consolidation, right greater than left. IMPRESSION: 1. Enteric catheter tip projecting over gastric antrum. Electronically Signed   By: Randa Ngo M.D.   On: 11/06/2021 17:11   CT Angio Chest Pulmonary Embolism (PE) W or WO Contrast  Result Date: 11/06/2021 CLINICAL DATA:  Pulmonary embolus suspected EXAM: CT ANGIOGRAPHY CHEST WITH CONTRAST TECHNIQUE: Multidetector CT imaging of the chest was performed using the standard protocol during bolus administration of intravenous contrast. Multiplanar CT image reconstructions and MIPs were obtained to evaluate the vascular anatomy. CONTRAST:  1107mL OMNIPAQUE IOHEXOL 350 MG/ML SOLN COMPARISON:  Chest CT dated August 21, 2021; chest CT dated March 04, 2021 FINDINGS: Cardiovascular: Adequate contrast opacification of the pulmonary arteries. No evidence of pulmonary embolus. No pericardial effusion.  Three-vessel coronary artery calcifications. Mild atherosclerotic disease of the thoracic aorta. Mediastinum/Nodes: Mildly enlarged lymph nodes are seen in the chest which are unchanged compared to prior exam. Reference enlarged right hilar lymph node measuring 1.5 cm in short axis. Esophagus and thyroid are unremarkable. Lungs/Pleura: ETT tip is in the midthoracic trachea. Mild centrilobular emphysema. Right greater than left patchy bilateral ground-glass opacities with areas of nodular consolidation.Right greater than left patchy bilateral ground-glass opacities with areas of nodular consolidation, when compared to prior exams dating back to March 04, 2021, some areas have improved, and other areas have worsened. For example, new solid nodule measuring 9 mm in the right upper lobe on series 7, image 91, and interval improvement of right upper lobe consolidation when compared with August 21, 2021. Trace bilateral pleural effusions and atelectasis. Upper Abdomen: Low-attenuation lesion of the left lobe of the liver, likely a simple cyst. No acute abnormality. Musculoskeletal: No chest wall abnormality. No acute or significant osseous findings. Review of the MIP images confirms the above findings. IMPRESSION: 1.  No evidence of pulmonary embolus. 2. Right greater than left patchy bilateral ground-glass opacities with areas of nodular consolidation, when compared to prior exams dating back to March 04, 2021, some areas have improved, and other areas have worsened. Given chronicity, differential considerations include chronic or recurrent infectious process, organizing pneumonia, or lymphomatous involvement of the lungs. 3. Trace bilateral pleural effusions and atelectasis. 4. Mildly enlarged lymph nodes are seen in the chest which are unchanged compared to prior exam. 5. Aortic Atherosclerosis (ICD10-I70.0) and Emphysema (ICD10-J43.9). Electronically Signed   By: Yetta Glassman M.D.   On: 11/06/2021 17:36   DG  Chest Port 1 View  Result Date: 12/03/2021 CLINICAL DATA:  Bibasilar airspace disease. EXAM: PORTABLE CHEST 1 VIEW COMPARISON:  11/29/2021 FINDINGS: Lungs are hyperexpanded. Cardiopericardial silhouette is at upper limits of normal for size. Bibasilar atelectasis/infiltrate is similar to prior with persistent small left pleural effusion. Tracheostomy tube again noted. A feeding tube passes into the stomach although the distal tip position is not included on the film. Telemetry leads overlie the chest. IMPRESSION: No substantial change. Bibasilar atelectasis/infiltrate with small left pleural effusion. Electronically Signed   By: Misty Stanley M.D.   On: 12/03/2021 06:53   DG Chest Port 1 View  Result Date: 11/29/2021 CLINICAL DATA:  Pneumonia. EXAM: PORTABLE CHEST 1 VIEW COMPARISON:  11/15/2021 FINDINGS: 0532 hours. Persistent bibasilar airspace disease, stable on the right and mildly improved on the left. Small left pleural effusion again noted. Interstitial markings are diffusely coarsened with chronic features. The cardio pericardial silhouette is  enlarged. Tracheostomy tube remains in place. A feeding tube passes into the stomach although the distal tip position is not included on the film. Telemetry leads overlie the chest. IMPRESSION: 1. Persistent bibasilar airspace disease, stable on the right and mildly improved on the left. 2. Small left pleural effusion. Electronically Signed   By: Misty Stanley M.D.   On: 11/29/2021 06:32   DG Chest Port 1 View  Result Date: 11/15/2021 CLINICAL DATA:  Respiratory failure EXAM: PORTABLE CHEST 1 VIEW COMPARISON:  11/12/2021 FINDINGS: Cardiac shadow is stable. Tracheostomy tube and feeding catheter are again noted. Persistent left retrocardiac opacity is seen. Increasing right basilar airspace opacity is noted as well. IMPRESSION: Bibasilar airspace opacities increased on the right from the prior exam. Electronically Signed   By: Inez Catalina M.D.   On: 11/15/2021  21:24   DG CHEST PORT 1 VIEW  Result Date: 11/10/2021 CLINICAL DATA:  COVID pneumonia EXAM: PORTABLE CHEST 1 VIEW COMPARISON:  Previous studies including the examination of 11/09/2021 FINDINGS: Transverse diameter of heart is increased. There are no signs of alveolar pulmonary edema. Increased markings are seen in the lower lung fields, more so on the left side with interval worsening. There is blunting of left lateral CP angle. There is no pneumothorax. Sclerotic density is noted in the posterior left fifth rib. Tip of tracheostomy is proximally 10 cm above the carina. IMPRESSION: Increased interstitial markings are seen in both lower lung fields, more so on the left side with interval worsening. Small left pleural effusion. Electronically Signed   By: Elmer Picker M.D.   On: 11/10/2021 12:15   DG Chest Port 1 View  Result Date: 11/09/2021 CLINICAL DATA:  Status post tracheostomy. EXAM: PORTABLE CHEST 1 VIEW COMPARISON:  11/09/2021 at 0526 hours FINDINGS: Tracheostomy tube is present. Again noted is a sclerotic density involving the posterior left fifth rib. Persistent patchy densities in the right lower lung. Few densities in the retrocardiac space. Negative for a pneumothorax. Heart size is stable. Nasogastric tube has been removed. IMPRESSION: 1. Interval placement of a tracheostomy tube. 2. No significant change in the patchy densities in the lower lungs. Electronically Signed   By: Markus Daft M.D.   On: 11/09/2021 11:14   DG Chest Port 1 View  Result Date: 11/09/2021 CLINICAL DATA:  Respiratory failure.  COVID pneumonia. EXAM: PORTABLE CHEST 1 VIEW COMPARISON:  Chest x-ray 11/06/2021 FINDINGS: The endotracheal tube is 6 cm above the carina. The NG tube is coursing down the esophagus and into the stomach. The cardiac silhouette, mediastinal and hilar contours are within normal limits stable. The lungs demonstrate stable hyperinflation and underlying emphysematous changes. Minimal patchy  infiltrates are again demonstrated. No pleural effusions or pneumothorax. IMPRESSION: 1. Stable support apparatus. 2. Persistent patchy infiltrates. Electronically Signed   By: Marijo Sanes M.D.   On: 11/09/2021 08:28   Portable Chest x-ray  Result Date: 11/06/2021 CLINICAL DATA:  OG tube placement EXAM: PORTABLE CHEST 1 VIEW COMPARISON:  10/07/2021, 4:30 a.m. FINDINGS: Esophagogastric tube with tip and side port below the diaphragm. Endotracheal tube with tip below the thoracic inlet. Mild, diffuse interstitial pulmonary opacity, unchanged. No new airspace opacity. Heart and mediastinum are normal. IMPRESSION: 1. Esophagogastric tube with tip and side port below the diaphragm. 2. Endotracheal tube with tip below the thoracic inlet. 3. Mild, diffuse interstitial pulmonary opacity, unchanged. No new airspace opacity. Electronically Signed   By: Delanna Ahmadi M.D.   On: 11/06/2021 15:24   DG Chest Port 1  View  Result Date: 11/06/2021 CLINICAL DATA:  66 year old male with acute respiratory failure, history of COVID-19. EXAM: PORTABLE CHEST - 1 VIEW COMPARISON:  11/04/2021 FINDINGS: The mediastinal contours are within normal limits. No cardiomegaly. Unchanged position of indwelling endotracheal tube with the distal tip in the mid to superior thoracic trachea. Gastric decompression tube terminates off the inferior aspect of this image. Unchanged scattered, right lower lobe predominant hazy pulmonary opacities. No evidence of pneumothorax or significant pleural effusion. No acute osseous abnormality. IMPRESSION: 1. Similar appearing scattered, right lower lobe predominant hazy opacities in keeping with history of multifocal pneumonia. 2. Similar positioning of support lines and tubes. Electronically Signed   By: Ruthann Cancer M.D.   On: 11/06/2021 08:12   DG Abd Portable 1V  Result Date: 11/15/2021 CLINICAL DATA:  Check gastric catheter placement EXAM: PORTABLE ABDOMEN - 1 VIEW COMPARISON:  11/10/2021  FINDINGS: Feeding catheter is noted in the distal stomach stable in appearance from the prior exam. IMPRESSION: Feeding catheter in the distal stomach. Electronically Signed   By: Inez Catalina M.D.   On: 11/15/2021 21:19   DG Abd Portable 1V  Result Date: 11/10/2021 CLINICAL DATA:  Feeding tube placement EXAM: PORTABLE ABDOMEN - 1 VIEW COMPARISON:  KUB 11/06/2021 FINDINGS: The enteric catheter tip projects over the region of the pylorus. There is a nonobstructive bowel gas pattern. There is no abnormal soft tissue calcification. There is no acute osseous abnormality. IMPRESSION: Enteric catheter tip projects over the region of the pylorus. Electronically Signed   By: Valetta Mole M.D.   On: 11/10/2021 14:00   VAS [place] LOWER EXTREMITY VENOUS (DVT)  Result Date: 11/07/2021  Lower Venous DVT Study Patient Name:  BERTHEL BAGNALL  Date of Exam:   11/06/2021 Medical Rec #: [id number]         Accession #:    [id number] Date of Birth: 06-17-1956         Patient Gender: M Patient Age:   19 years Exam Location:  [facility] Procedure:      VAS [place] LOWER EXTREMITY VENOUS (DVT) Referring Phys: Hayden Pedro --------------------------------------------------------------------------------  Indications: Edema.  Comparison Study: 12/09/20 prior Performing Technologist: Archie Patten RVS  Examination Guidelines: A complete evaluation includes B-mode imaging, spectral Doppler, color Doppler, and power Doppler as needed of all accessible portions of each vessel. Bilateral testing is considered an integral part of a complete examination. Limited examinations for reoccurring indications may be performed as noted. The reflux portion of the exam is performed with the patient in reverse Trendelenburg.  +---------+---------------+---------+-----------+----------+--------------+  RIGHT     Compressibility Phasicity Spontaneity Properties Thrombus Aging   +---------+---------------+---------+-----------+----------+--------------+  CFV       Full            Yes       Yes                                    +---------+---------------+---------+-----------+----------+--------------+  SFJ       Full                                                             +---------+---------------+---------+-----------+----------+--------------+  FV Prox   Full                                                             +---------+---------------+---------+-----------+----------+--------------+  FV Mid    Full                                                             +---------+---------------+---------+-----------+----------+--------------+  FV Distal Full                                                             +---------+---------------+---------+-----------+----------+--------------+  PFV       Full                                                             +---------+---------------+---------+-----------+----------+--------------+  POP       Full            Yes       Yes                                    +---------+---------------+---------+-----------+----------+--------------+  PTV       Full                                                             +---------+---------------+---------+-----------+----------+--------------+  PERO      Full                                                             +---------+---------------+---------+-----------+----------+--------------+   +---------+---------------+---------+-----------+----------+--------------+  LEFT      Compressibility Phasicity Spontaneity Properties Thrombus Aging  +---------+---------------+---------+-----------+----------+--------------+  CFV       Full            Yes       Yes                                    +---------+---------------+---------+-----------+----------+--------------+  SFJ       Full                                                              +---------+---------------+---------+-----------+----------+--------------+  FV Prox   Full                                                             +---------+---------------+---------+-----------+----------+--------------+  FV Mid    Full                                                             +---------+---------------+---------+-----------+----------+--------------+  FV Distal Full                                                             +---------+---------------+---------+-----------+----------+--------------+  PFV       Full                                                             +---------+---------------+---------+-----------+----------+--------------+  POP       Full            Yes       Yes                                    +---------+---------------+---------+-----------+----------+--------------+  PTV       Full                                                             +---------+---------------+---------+-----------+----------+--------------+  PERO      Full                                                             +---------+---------------+---------+-----------+----------+--------------+     Summary: BILATERAL: - No evidence of deep vein thrombosis seen in the lower extremities, bilaterally. -No evidence of popliteal cyst, bilaterally.   *See table(s) above for measurements and observations. Electronically signed by Servando Snare MD on 11/06/2021 at 4:24:58 PM.    Final (Updating)    ECHOCARDIOGRAM LIMITED  Result Date: 11/07/2021    ECHOCARDIOGRAM LIMITED REPORT   Patient Name:   AC COLAN Date of Exam: 11/07/2021 Medical Rec #:  [id number]        Height:       70.0 in Accession #:    [id number]       Weight:       207.2 lb Date of Birth:  [birth year]        BSA:          2.119 m Patient Age:    58 years         BP:           177/93 mmHg Patient Gender: M                HR:  90 bpm. Exam Location:  Inpatient Procedure: Limited Echo Indications:    CHF  History:         Patient has prior history of Echocardiogram examinations, most                 recent 12/08/2020. COPD; Risk Factors:Hypertension and                 Dyslipidemia. Covid positive. Pneumonia.  Sonographer:    Merrie Roof RDCS Referring Phys: [id number] Omar Person  Sonographer Comments: Echo performed with patient supine and on artificial respirator. IMPRESSIONS  1. Left ventricular ejection fraction, by estimation, is 55 to 60%. The left ventricle has normal function. The left ventricle has no regional wall motion abnormalities. Left ventricular diastolic function could not be evaluated.  2. Right ventricular systolic function is normal. The right ventricular size is normal.  3. Left atrial size was moderately dilated.  4. The mitral valve is normal in structure. Trivial mitral valve regurgitation. No evidence of mitral stenosis.  5. The aortic valve is grossly normal. Aortic valve regurgitation is not visualized. No aortic stenosis is present. FINDINGS  Left Ventricle: Left ventricular ejection fraction, by estimation, is 55 to 60%. The left ventricle has normal function. The left ventricle has no regional wall motion abnormalities. The left ventricular internal cavity size was normal in size. There is  no left ventricular hypertrophy. Left ventricular diastolic function could not be evaluated. Right Ventricle: The right ventricular size is normal. No increase in right ventricular wall thickness. Right ventricular systolic function is normal. Left Atrium: Left atrial size was moderately dilated. Right Atrium: Right atrial size was normal in size. Pericardium: There is no evidence of pericardial effusion. Mitral Valve: The mitral valve is normal in structure. Trivial mitral valve regurgitation. No evidence of mitral valve stenosis. Tricuspid Valve: The tricuspid valve is normal in structure. Tricuspid valve regurgitation is not demonstrated. No evidence of tricuspid stenosis. Aortic Valve: The aortic valve is  grossly normal. Aortic valve regurgitation is not visualized. No aortic stenosis is present. Pulmonic Valve: The pulmonic valve was not well visualized. Pulmonic valve regurgitation is not visualized. No evidence of pulmonic stenosis. Aorta: The aortic root is normal in size and structure. Venous: IVC assessment for right atrial pressure unable to be performed due to mechanical ventilation. IAS/Shunts: The interatrial septum was not assessed. RIGHT VENTRICLE          IVC RV Basal diam:  3.50 cm  IVC diam: 2.40 cm LEFT ATRIUM             Index        RIGHT ATRIUM           Index LA Vol (A2C):   81.6 ml 38.51 ml/m  RA Area:     18.90 cm LA Vol (A4C):   57.7 ml 27.23 ml/m  RA Volume:   55.00 ml  25.96 ml/m LA Biplane Vol: 69.4 ml 32.75 ml/m Sanda Klein MD Electronically signed by Sanda Klein MD Signature Date/Time: 11/07/2021/5:41:34 PM    Final     Labs:  CBC: Recent Labs    11/29/21 0440 12/02/21 0434 12/03/21 0452 12/04/21 0601  WBC 7.2 8.5 8.5 8.1  HGB 8.6* 8.0* 8.3* 8.4*  HCT 30.0* 27.2* 28.0* 28.9*  PLT 190 252 283 281    COAGS: Recent Labs    12/14/20 0043 12/20/20 0446 11/04/21 0202 12/04/21 0601  INR 1.1 1.0 1.1 1.1  APTT $Rem'25 26 29  'hKGg$ --  BMP: Recent Labs    11/29/21 0440 11/29/21 2301 11/30/21 0410 12/02/21 0434 12/04/21 0601  NA 138  --  139 139 139  K 5.2* 4.5 4.4 4.5 4.2  CL 97*  --  99 100 98  CO2 33*  --  34* 34* 33*  GLUCOSE 242*  --  126* 95 129*  BUN 22  --  23 24* 25*  CALCIUM 8.5*  --  7.9* 8.2* 8.0*  CREATININE 0.80  --  0.80 0.68 0.91  GFRNONAA >60  --  >60 >60 >60    LIVER FUNCTION TESTS: Recent Labs    10/27/21 1500 11/04/21 0202 11/10/21 0928 11/17/21 0405  BILITOT 0.8 0.8 1.0 0.6  AST $Re'18 27 20 'BiD$ 53*  ALT $Re'22 14 16 26  'SuF$ ALKPHOS 116 83 56 48  PROT 6.5 6.4* 5.0* 4.3*  ALBUMIN 3.8 3.2* 3.0* 1.6*    TUMOR MARKERS: No results for input(s): AFPTM, CEA, CA199, CHROMGRNA in the last 8760 hours.  Assessment and  Plan:  Dysphagia Poor nutrition Deconditioning Need for long term care Request for percutaneous Gastric tube in IR - Wife wants to speak to MD I will call later to see about consent for procedure  Thank you for this interesting consult.  I greatly enjoyed meeting ZAILEN ALBARRAN and look forward to participating in their care.  A copy of this report was sent to the requesting provider on this date.  Electronically Signed: Lavonia Drafts, PA-C 12/04/2021, 9:45 AM   I spent a total of 40 Minutes    in face to face in clinical consultation, greater than 50% of which was counseling/coordinating care for percutaneous gastric tube placement

## 2021-12-05 ENCOUNTER — Other Ambulatory Visit (HOSPITAL_COMMUNITY): Payer: BC Managed Care – PPO

## 2021-12-05 ENCOUNTER — Other Ambulatory Visit (HOSPITAL_COMMUNITY): Payer: Self-pay

## 2021-12-05 DIAGNOSIS — J449 Chronic obstructive pulmonary disease, unspecified: Secondary | ICD-10-CM | POA: Diagnosis not present

## 2021-12-05 DIAGNOSIS — U071 COVID-19: Secondary | ICD-10-CM | POA: Diagnosis not present

## 2021-12-05 DIAGNOSIS — R6520 Severe sepsis without septic shock: Secondary | ICD-10-CM | POA: Diagnosis not present

## 2021-12-05 DIAGNOSIS — J9620 Acute and chronic respiratory failure, unspecified whether with hypoxia or hypercapnia: Secondary | ICD-10-CM | POA: Diagnosis not present

## 2021-12-05 DIAGNOSIS — R1310 Dysphagia, unspecified: Secondary | ICD-10-CM | POA: Diagnosis not present

## 2021-12-05 DIAGNOSIS — J189 Pneumonia, unspecified organism: Secondary | ICD-10-CM | POA: Diagnosis not present

## 2021-12-05 DIAGNOSIS — A419 Sepsis, unspecified organism: Secondary | ICD-10-CM | POA: Diagnosis not present

## 2021-12-05 DIAGNOSIS — J9621 Acute and chronic respiratory failure with hypoxia: Secondary | ICD-10-CM | POA: Diagnosis not present

## 2021-12-05 DIAGNOSIS — I10 Essential (primary) hypertension: Secondary | ICD-10-CM | POA: Diagnosis not present

## 2021-12-05 HISTORY — PX: IR GASTROSTOMY TUBE MOD SED: IMG625

## 2021-12-05 MED ORDER — CEFAZOLIN SODIUM-DEXTROSE 2-4 GM/100ML-% IV SOLN: 2.0000 g | Freq: Once | INTRAVENOUS | Status: DC

## 2021-12-05 MED ORDER — LIDOCAINE HCL 1 % IJ SOLN
INTRAMUSCULAR | Status: AC
  Filled 2021-12-05: qty 20

## 2021-12-05 MED ORDER — IOHEXOL 300 MG/ML  SOLN
100.0000 mL | Freq: Once | INTRAMUSCULAR | Status: AC | PRN
Start: 2021-12-05 — End: 2021-12-05
  Administered 2021-12-05: 20 mL

## 2021-12-05 MED ORDER — FENTANYL CITRATE (PF) 100 MCG/2ML IJ SOLN
INTRAMUSCULAR | Status: AC | PRN
  Administered 2021-12-05 (×3): 25 ug via INTRAVENOUS

## 2021-12-05 MED ORDER — MIDAZOLAM HCL 2 MG/2ML IJ SOLN
INTRAMUSCULAR | Status: AC
  Filled 2021-12-05: qty 2

## 2021-12-05 MED ORDER — CEFAZOLIN SODIUM-DEXTROSE 2-4 GM/100ML-% IV SOLN
INTRAVENOUS | Status: AC
  Administered 2021-12-05: 2 g
  Filled 2021-12-05: qty 100

## 2021-12-05 MED ORDER — CEFAZOLIN SODIUM-DEXTROSE 1-4 GM/50ML-% IV SOLN: 1.0000 g | Freq: Once | INTRAVENOUS | Status: DC

## 2021-12-05 MED ORDER — FENTANYL CITRATE (PF) 100 MCG/2ML IJ SOLN
INTRAMUSCULAR | Status: AC
  Filled 2021-12-05: qty 2

## 2021-12-05 MED ORDER — MIDAZOLAM HCL 2 MG/2ML IJ SOLN
INTRAMUSCULAR | Status: AC | PRN
Start: 2021-12-05 — End: 2021-12-05
  Administered 2021-12-05 (×3): .5 mg via INTRAVENOUS

## 2021-12-05 MED ORDER — GLUCAGON HCL RDNA (DIAGNOSTIC) 1 MG IJ SOLR
INTRAMUSCULAR | Status: AC
  Filled 2021-12-05: qty 1

## 2021-12-05 NOTE — Progress Notes (Signed)
Pulmonary Critical Care Medicine [place]   PULMONARY CRITICAL CARE SERVICE  PROGRESS NOTE     Isaac [name]  BTD:[id number]  DOB: Feb 13, 1956   DOA: 11/15/2021  Referring Physician: Satira Sark, MD  HPI: Isaac [name] is a 66 y.o. male being followed for ventilator/airway/oxygen weaning Acute on Chronic Respiratory Failure.  Patient is comfortable without distress is on T collar has been on 40% FiO2 today's goal is 16 hours  Medications: Reviewed on Rounds  Physical Exam:  Vitals: The temperature 98.7 pulse 83 respiratory 25 blood pressure is 144/74 saturations 93%  Ventilator Settings on T collar FiO2 40%  General: Comfortable at this time Neck: supple Cardiovascular: no malignant arrhythmias Respiratory: No rhonchi very coarse breath sounds Skin: no rash seen on limited exam Musculoskeletal: No gross abnormality Psychiatric:unable to assess Neurologic:no involuntary movements         Lab Data:   Basic Metabolic Panel: Recent Labs  Lab 11/29/21 0440 11/29/21 2301 11/30/21 0410 12/02/21 0434 12/04/21 0601  NA 138  --  139 139 139  K 5.2* 4.5 4.4 4.5 4.2  CL 97*  --  99 100 98  CO2 33*  --  34* 34* 33*  GLUCOSE 242*  --  126* 95 129*  BUN 22  --  23 24* 25*  CREATININE 0.80  --  0.80 0.68 0.91  CALCIUM 8.5*  --  7.9* 8.2* 8.0*  MG 2.3  --   --  2.3 2.5*  PHOS 5.6*  --   --  4.0 4.7*    ABG: No results for input(s): PHART, PCO2ART, PO2ART, HCO3, O2SAT in the last 168 hours.  Liver Function Tests: No results for input(s): AST, ALT, ALKPHOS, BILITOT, PROT, ALBUMIN in the last 168 hours. No results for input(s): LIPASE, AMYLASE in the last 168 hours. No results for input(s): AMMONIA in the last 168 hours.  CBC: Recent Labs  Lab 11/29/21 0440 12/02/21 0434 12/03/21 0452 12/04/21 0601  WBC 7.2 8.5 8.5 8.1  HGB 8.6* 8.0* 8.3* 8.4*  HCT 30.0* 27.2* 28.0* 28.9*  MCV 87.7 87.5 87.2 86.8  PLT 190 252 283 281     Cardiac Enzymes: No results for input(s): CKTOTAL, CKMB, CKMBINDEX, TROPONINI in the last 168 hours.  BNP (last 3 results) Recent Labs    12/08/20 0100 03/04/21 1555 08/21/21 0504  BNP 146.1* 93.2 142.9*    ProBNP (last 3 results) No results for input(s): PROBNP in the last 8760 hours.  Radiological Exams: CT ABDOMEN WO CONTRAST  Result Date: 12/04/2021 CLINICAL DATA:  Evaluate gastric anatomy prior to potential percutaneous gastrostomy tube placement. EXAM: CT ABDOMEN WITHOUT CONTRAST TECHNIQUE: Multidetector CT imaging of the abdomen was performed following the standard protocol without IV contrast. COMPARISON:  None. FINDINGS: Lower chest: Limited visualization of the lower thorax demonstrates trace bilateral effusions with associated bibasilar consolidative opacities and associated air bronchograms, left greater than right. Cardiomegaly. Trace amount of pericardial fluid, presumably physiologic. Hepatobiliary: Normal hepatic contour. There is an approximately 5.3 cm hypoattenuating lesion within the left lobe of the liver (axial image 14, series 3), incompletely evaluated on this noncontrast examination and while minimally increased in size compared to remote examination performed in 2009 previously measuring 3.1 cm, favored to represent a hepatic cyst. Normal noncontrast appearance of the gallbladder given degree distention. No radiopaque gallstones. No intra or extrahepatic biliary dilatation. Pancreas: Normal noncontrast appearance of the pancreas. Spleen: The spleen is enlarged measuring 17.3 cm in length (image 36, series 3).  Adrenals/Urinary Tract: Normal noncontrast appearance the bilateral kidneys. No renal stones. There is a minimal amount of grossly symmetric likely age and body habitus related perinephric stranding. No urinary obstruction. Redemonstrated approximately 4.0 x 3.0 cm macroscopic fat containing left-sided adrenal myelolipoma (image 26, series 3, similar to chest CT  performed 02/2021. Grossly unchanged approximately 1.2 cm right-sided adrenal adenoma (image 21, series 3). The urinary bladder was not imaged. Stomach/Bowel: The anterior wall the stomach is well apposed against the ventral wall of the upper abdomen and would likely be improved with gastric insufflation. Enteric tube tip terminates within the gastric antrum. No significant hiatal hernia. Moderate colonic stool burden without evidence of enteric obstruction. No pneumoperitoneum, pneumatosis or portal venous gas. Vascular/Lymphatic: Atherosclerotic plaque within a normal caliber abdominal aorta. Known retroperitoneal and mesenteric lymphadenopathy persists though appears slightly improved within the upper abdomen compared to previous examinations with index gastrohepatic ligament lymph node now measuring 1.3 cm in greatest short axis diameter (image 21, series 3), previously, 2.1 cm. Index left-sided periaortic lymph node measures 1.7 cm in greatest short axis diameter (image 49, series 3 and index left sided pelvic wall lymph node measures 1.9 cm (image 66, series 3). Other: Diffuse body wall anasarca. Musculoskeletal: No acute or aggressive osseous abnormalities. IMPRESSION: 1. Gastric anatomy amenable to attempted percutaneous gastrostomy tube placement as indicated. 2. Redemonstrated though potentially improved abdominal adenopathy compatible with known history of CLL. 3. Trace bilateral effusions with associated bibasilar consolidative opacities and associated air bronchograms, left greater than right, atelectasis versus infiltrate. 4. Cardiomegaly. 5. Aortic Atherosclerosis (ICD10-I70.0). Electronically Signed   By: Sandi Mariscal M.D.   On: 12/04/2021 07:45    Assessment/Plan Active Problems:   COPD, severe (HCC)   Acute on chronic respiratory failure with hypoxia (HCC)   HCAP (healthcare-associated pneumonia)   COVID-19   Severe sepsis (HCC)   Acute on chronic respiratory failure hypoxia we will  continue with the T-piece patient's goal is 16 hours today Severe COPD medical management we will continue to follow along closely Healthcare associated pneumonia has been treated with antibiotics we will continue with supportive care COVID-19 virus infection in recovery Severe sepsis treated resolved hemodynamics are stable   I have personally seen and evaluated the patient, evaluated laboratory and imaging results, formulated the assessment and plan and placed orders. The Patient requires high complexity decision making with multiple systems involvement.  Rounds were done with the Respiratory Therapy Director and Staff therapists and discussed with nursing staff also.  Allyne Gee, MD [facility] Pulmonary Critical Care Medicine Sleep Medicine

## 2021-12-06 DIAGNOSIS — J9620 Acute and chronic respiratory failure, unspecified whether with hypoxia or hypercapnia: Secondary | ICD-10-CM | POA: Diagnosis not present

## 2021-12-06 DIAGNOSIS — J9621 Acute and chronic respiratory failure with hypoxia: Secondary | ICD-10-CM | POA: Diagnosis not present

## 2021-12-06 DIAGNOSIS — I10 Essential (primary) hypertension: Secondary | ICD-10-CM | POA: Diagnosis not present

## 2021-12-06 DIAGNOSIS — A419 Sepsis, unspecified organism: Secondary | ICD-10-CM | POA: Diagnosis not present

## 2021-12-06 DIAGNOSIS — J449 Chronic obstructive pulmonary disease, unspecified: Secondary | ICD-10-CM | POA: Diagnosis not present

## 2021-12-06 DIAGNOSIS — J189 Pneumonia, unspecified organism: Secondary | ICD-10-CM | POA: Diagnosis not present

## 2021-12-06 DIAGNOSIS — R6520 Severe sepsis without septic shock: Secondary | ICD-10-CM | POA: Diagnosis not present

## 2021-12-06 DIAGNOSIS — U071 COVID-19: Secondary | ICD-10-CM | POA: Diagnosis not present

## 2021-12-06 DIAGNOSIS — D638 Anemia in other chronic diseases classified elsewhere: Secondary | ICD-10-CM | POA: Diagnosis not present

## 2021-12-06 NOTE — Procedures (Signed)
Interventional Radiology Procedure Note  Date of Procedure: 12/05/21   Procedure: Placement of percutaneous 21F pull-through gastrostomy tube.   The post-pyloric feeding tube was displaced during the procedure, and was replaced, with the weighted tip now in the duodenum, post pyloric.  Needs a new vomer tie   Complications: None   Recommendations: - NPO except for sips and chips remainder of today and overnight - Maintain G-tube to LWS until tomorrow morning  - May advance diet as tolerated and begin using tube tomorrow morning - The post-pyloric feeding tube was displaced, and needed replacing.  Vomer tie was cut, and needs replacing.  OK to use now

## 2021-12-06 NOTE — Progress Notes (Signed)
Pulmonary Critical Care Medicine [place]   PULMONARY CRITICAL CARE SERVICE  PROGRESS NOTE     [name]  MGQ:[id number]  DOB: [birth year]   DOA: 11/15/2021  Referring Physician: Satira Sark, MD  HPI: Isaac [name] is a 66 y.o. male being followed for ventilator/airway/oxygen weaning Acute on Chronic Respiratory Failure.  Patient is comfortable without distress at this time has been on T-piece currently on 40% FiO2  Medications: Reviewed on Rounds  Physical Exam:  Vitals: Temperature is 98.6 pulse 93 respiratory 28 blood pressure is 134/45 saturations 95%  Ventilator Settings on T collar on 40% FiO2  General: Comfortable at this time Neck: supple Cardiovascular: no malignant arrhythmias Respiratory: No rhonchi very coarse breath sounds Skin: no rash seen on limited exam Musculoskeletal: No gross abnormality Psychiatric:unable to assess Neurologic:no involuntary movements         Lab Data:   Basic Metabolic Panel: Recent Labs  Lab 11/29/21 2301 11/30/21 0410 12/02/21 0434 12/04/21 0601  NA  --  139 139 139  K 4.5 4.4 4.5 4.2  CL  --  99 100 98  CO2  --  34* 34* 33*  GLUCOSE  --  126* 95 129*  BUN  --  23 24* 25*  CREATININE  --  0.80 0.68 0.91  CALCIUM  --  7.9* 8.2* 8.0*  MG  --   --  2.3 2.5*  PHOS  --   --  4.0 4.7*    ABG: No results for input(s): PHART, PCO2ART, PO2ART, HCO3, O2SAT in the last 168 hours.  Liver Function Tests: No results for input(s): AST, ALT, ALKPHOS, BILITOT, PROT, ALBUMIN in the last 168 hours. No results for input(s): LIPASE, AMYLASE in the last 168 hours. No results for input(s): AMMONIA in the last 168 hours.  CBC: Recent Labs  Lab 12/02/21 0434 12/03/21 0452 12/04/21 0601  WBC 8.5 8.5 8.1  HGB 8.0* 8.3* 8.4*  HCT 27.2* 28.0* 28.9*  MCV 87.5 87.2 86.8  PLT 252 283 281    Cardiac Enzymes: No results for input(s): CKTOTAL, CKMB, CKMBINDEX, TROPONINI in the last 168 hours.  BNP  (last 3 results) Recent Labs    12/08/20 0100 03/04/21 1555 08/21/21 0504  BNP 146.1* 93.2 142.9*    ProBNP (last 3 results) No results for input(s): PROBNP in the last 8760 hours.  Radiological Exams: IR GASTROSTOMY TUBE MOD SED  Result Date: 12/05/2021 INDICATION: 66 year old male with a history dysphagia referred for gastrostomy EXAM: PERC PLACEMENT GASTROSTOMY MEDICATIONS: 2 g Ancef; Antibiotics were administered within 1 hour of the procedure. Scratch ANESTHESIA/SEDATION: Versed 1.5 mg IV; Fentanyl 75 mcg IV Moderate Sedation Time:  17 minutes The patient was continuously monitored during the procedure by the interventional radiology nurse under my direct supervision. CONTRAST:  12mL OMNIPAQUE IOHEXOL 300 MG/ML SOLN - administered into the gastric lumen. FLUOROSCOPY TIME:  Fluoroscopy Time: 6 minutes 18 seconds (32.5 mGy). COMPLICATIONS: None PROCEDURE: Informed written consent was obtained from the patient and the patient's family after a thorough discussion of the procedural risks, benefits and alternatives. All questions were addressed. Maximal Sterile Barrier Technique was utilized including caps, mask, sterile gowns, sterile gloves, sterile drape, hand hygiene and skin antiseptic. A timeout was performed prior to the initiation of the procedure. The epigastrium was prepped with Betadine in a sterile fashion, and a sterile drape was applied covering the operative field. A sterile gown and sterile gloves were used for the procedure. A 5-French orogastric tube is placed  under fluoroscopic guidance. Scout imaging of the abdomen confirms barium within the transverse colon. The stomach was distended with gas. Under fluoroscopic guidance, an 18 gauge needle was utilized to puncture the anterior wall of the body of the stomach. An Amplatz wire was advanced through the needle passing a T fastener into the lumen of the stomach. The T fastener was secured for gastropexy. A 9-French sheath was inserted.  A snare was advanced through the 9-French sheath. A Britta Mccreedy was advanced through the orogastric tube. It was snared then pulled out the oral cavity, pulling the snare, as well. The leading edge of the gastrostomy was attached to the snare. It was then pulled down the esophagus and out the percutaneous site. Tube secured in place. Contrast was injected. During the withdrawal of the wire through the stomach after the wire was snared, the local friction of the wire and catheter dragged the weighted tip enteric feeding tube into the esophagus. This needed to be replaced into the stomach. The vomer tie was cut and the catheter was reduced at the nose. A Glidewire was then pushed through the weighted tip catheter and advanced through the esophagus into the stomach. With some minor manipulation the weighted tip enteric feeding tube was then replaced into the duodenum. Contrast was injected confirming location in the duodenum and a final image was stored. Patient tolerated the procedure well and remained hemodynamically stable throughout. No complications were encountered and no significant blood loss encountered. IMPRESSION: Status post fluoroscopic placed percutaneous gastrostomy tube, with 20 [place] pull-through. The weighted tip enteric feeding tube was replaced after accidental displacement into the esophagus, with the weighted tip enteric feeding tube terminating in the duodenum. Signed, Dulcy Fanny. Earleen Newport, DO [facility] Radiology Electronically Signed   By: Corrie Mckusick D.O.   On: 12/05/2021 12:50    Assessment/Plan Active Problems:   COPD, severe (HCC)   Acute on chronic respiratory failure with hypoxia (HCC)   HCAP (healthcare-associated pneumonia)   COVID-19   Severe sepsis (HCC)   Acute on chronic respiratory failure with hypoxia plan is going to be to continue with the T-piece has been on 40% FiO2 the goal is for 20 hours today Severe COPD medical  management nebulizers as needed Healthcare associated pneumonia has been treated we will continue to follow along COVID-19 virus infection in recovery Severe sepsis treated resolving hemodynamics are stable   I have personally seen and evaluated the patient, evaluated laboratory and imaging results, formulated the assessment and plan and placed orders. The Patient requires high complexity decision making with multiple systems involvement.  Rounds were done with the Respiratory Therapy Director and Staff therapists and discussed with nursing staff also.  Allyne Gee, MD [facility] Pulmonary Critical Care Medicine Sleep Medicine

## 2021-12-07 DIAGNOSIS — I10 Essential (primary) hypertension: Secondary | ICD-10-CM | POA: Diagnosis not present

## 2021-12-07 DIAGNOSIS — J9621 Acute and chronic respiratory failure with hypoxia: Secondary | ICD-10-CM | POA: Diagnosis not present

## 2021-12-07 DIAGNOSIS — J189 Pneumonia, unspecified organism: Secondary | ICD-10-CM | POA: Diagnosis not present

## 2021-12-07 DIAGNOSIS — R6520 Severe sepsis without septic shock: Secondary | ICD-10-CM | POA: Diagnosis not present

## 2021-12-07 DIAGNOSIS — D638 Anemia in other chronic diseases classified elsewhere: Secondary | ICD-10-CM | POA: Diagnosis not present

## 2021-12-07 DIAGNOSIS — J449 Chronic obstructive pulmonary disease, unspecified: Secondary | ICD-10-CM | POA: Diagnosis not present

## 2021-12-07 DIAGNOSIS — A419 Sepsis, unspecified organism: Secondary | ICD-10-CM | POA: Diagnosis not present

## 2021-12-07 DIAGNOSIS — J9620 Acute and chronic respiratory failure, unspecified whether with hypoxia or hypercapnia: Secondary | ICD-10-CM | POA: Diagnosis not present

## 2021-12-07 DIAGNOSIS — U071 COVID-19: Secondary | ICD-10-CM | POA: Diagnosis not present

## 2021-12-07 LAB — RENAL FUNCTION PANEL
Albumin: 2 g/dL — ABNORMAL LOW (ref 3.5–5.0)
Anion gap: 5 (ref 5–15)
BUN: 18 mg/dL (ref 8–23)
CO2: 34 mmol/L — ABNORMAL HIGH (ref 22–32)
Calcium: 8.1 mg/dL — ABNORMAL LOW (ref 8.9–10.3)
Chloride: 101 mmol/L (ref 98–111)
Creatinine, Ser: 0.67 mg/dL (ref 0.61–1.24)
GFR, Estimated: >60 mL/min (ref >60)
Glucose, Bld: 135 mg/dL — ABNORMAL HIGH (ref 70–99)
Phosphorus: 4 mg/dL (ref 2.5–4.6)
Potassium: 4.5 mmol/L (ref 3.5–5.1)
Sodium: 140 mmol/L (ref 135–145)

## 2021-12-07 LAB — CBC
HCT: 29.4 % — ABNORMAL LOW (ref 39.0–52.0)
Hemoglobin: 8.5 g/dL — ABNORMAL LOW (ref 13.0–17.0)
MCH: 25.3 pg — ABNORMAL LOW (ref 26.0–34.0)
MCHC: 28.9 g/dL — ABNORMAL LOW (ref 30.0–36.0)
MCV: 87.5 fL (ref 80.0–100.0)
Platelets: 247 10*3/uL (ref 150–400)
RBC: 3.36 MIL/uL — ABNORMAL LOW (ref 4.22–5.81)
RDW: 15.7 % — ABNORMAL HIGH (ref 11.5–15.5)
WBC: 8 10*3/uL (ref 4.0–10.5)
nRBC: 0 % (ref 0.0–0.2)

## 2021-12-07 LAB — MAGNESIUM: Magnesium: 2.4 mg/dL (ref 1.7–2.4)

## 2021-12-07 NOTE — Progress Notes (Signed)
Pulmonary Critical Care Medicine [place]   PULMONARY CRITICAL CARE SERVICE  PROGRESS NOTE     Isaac [name]  GBT:[id number]  DOB: Apr 11, 1956   DOA: 11/15/2021  Referring Physician: Satira Sark, MD  HPI: Isaac [name] is a 66 y.o. male being followed for ventilator/airway/oxygen weaning Acute on Chronic Respiratory Failure.  Patient is comfortable without distress afebrile right now for the wean but is supposed to go back on T-piece for 24 hours  Medications: Reviewed on Rounds  Physical Exam:  Vitals: Temperature is 97.7 pulse 85 respiratory 26 blood pressure is 119/79 saturations 95%  Ventilator Settings supposed to do T collar today 24 hours  General: Comfortable at this time Neck: supple Cardiovascular: no malignant arrhythmias Respiratory: Scattered rhonchi expansion is equal Skin: no rash seen on limited exam Musculoskeletal: No gross abnormality Psychiatric:unable to assess Neurologic:no involuntary movements         Lab Data:   Basic Metabolic Panel: Recent Labs  Lab 12/02/21 0434 12/04/21 0601 12/07/21 0434  NA 139 139 140  K 4.5 4.2 4.5  CL 100 98 101  CO2 34* 33* 34*  GLUCOSE 95 129* 135*  BUN 24* 25* 18  CREATININE 0.68 0.91 0.67  CALCIUM 8.2* 8.0* 8.1*  MG 2.3 2.5* 2.4  PHOS 4.0 4.7* 4.0    ABG: No results for input(s): PHART, PCO2ART, PO2ART, HCO3, O2SAT in the last 168 hours.  Liver Function Tests: Recent Labs  Lab 12/07/21 0434  ALBUMIN 2.0*   No results for input(s): LIPASE, AMYLASE in the last 168 hours. No results for input(s): AMMONIA in the last 168 hours.  CBC: Recent Labs  Lab 12/02/21 0434 12/03/21 0452 12/04/21 0601 12/07/21 0434  WBC 8.5 8.5 8.1 8.0  HGB 8.0* 8.3* 8.4* 8.5*  HCT 27.2* 28.0* 28.9* 29.4*  MCV 87.5 87.2 86.8 87.5  PLT 252 283 281 247    Cardiac Enzymes: No results for input(s): CKTOTAL, CKMB, CKMBINDEX, TROPONINI in the last 168 hours.  BNP (last 3  results) Recent Labs    12/08/20 0100 03/04/21 1555 08/21/21 0504  BNP 146.1* 93.2 142.9*    ProBNP (last 3 results) No results for input(s): PROBNP in the last 8760 hours.  Radiological Exams: IR GASTROSTOMY TUBE MOD SED  Result Date: 12/05/2021 INDICATION: 66 year old male with a history dysphagia referred for gastrostomy EXAM: PERC PLACEMENT GASTROSTOMY MEDICATIONS: 2 g Ancef; Antibiotics were administered within 1 hour of the procedure. Scratch ANESTHESIA/SEDATION: Versed 1.5 mg IV; Fentanyl 75 mcg IV Moderate Sedation Time:  17 minutes The patient was continuously monitored during the procedure by the interventional radiology nurse under my direct supervision. CONTRAST:  71mL OMNIPAQUE IOHEXOL 300 MG/ML SOLN - administered into the gastric lumen. FLUOROSCOPY TIME:  Fluoroscopy Time: 6 minutes 18 seconds (32.5 mGy). COMPLICATIONS: None PROCEDURE: Informed written consent was obtained from the patient and the patient's family after a thorough discussion of the procedural risks, benefits and alternatives. All questions were addressed. Maximal Sterile Barrier Technique was utilized including caps, mask, sterile gowns, sterile gloves, sterile drape, hand hygiene and skin antiseptic. A timeout was performed prior to the initiation of the procedure. The epigastrium was prepped with Betadine in a sterile fashion, and a sterile drape was applied covering the operative field. A sterile gown and sterile gloves were used for the procedure. A 5-French orogastric tube is placed under fluoroscopic guidance. Scout imaging of the abdomen confirms barium within the transverse colon. The stomach was distended with gas. Under fluoroscopic guidance, an  16 gauge needle was utilized to puncture the anterior wall of the body of the stomach. An Amplatz wire was advanced through the needle passing a T fastener into the lumen of the stomach. The T fastener was secured for gastropexy. A 9-French sheath was inserted. A snare  was advanced through the 9-French sheath. A Isaac [name] was advanced through the orogastric tube. It was snared then pulled out the oral cavity, pulling the snare, as well. The leading edge of the gastrostomy was attached to the snare. It was then pulled down the esophagus and out the percutaneous site. Tube secured in place. Contrast was injected. During the withdrawal of the wire through the stomach after the wire was snared, the local friction of the wire and catheter dragged the weighted tip enteric feeding tube into the esophagus. This needed to be replaced into the stomach. The vomer tie was cut and the catheter was reduced at the nose. A Glidewire was then pushed through the weighted tip catheter and advanced through the esophagus into the stomach. With some minor manipulation the weighted tip enteric feeding tube was then replaced into the duodenum. Contrast was injected confirming location in the duodenum and a final image was stored. Patient tolerated the procedure well and remained hemodynamically stable throughout. No complications were encountered and no significant blood loss encountered. IMPRESSION: Status post fluoroscopic placed percutaneous gastrostomy tube, with 20 [place] pull-through. The weighted tip enteric feeding tube was replaced after accidental displacement into the esophagus, with the weighted tip enteric feeding tube terminating in the duodenum. Signed, Isaac [name]. Isaac Newport, DO [facility] Radiology Electronically Signed   By: Corrie Mckusick D.O.   On: 12/05/2021 12:50    Assessment/Plan Active Problems:   COPD, severe (HCC)   Acute on chronic respiratory failure with hypoxia (HCC)   HCAP (healthcare-associated pneumonia)   COVID-19   Severe sepsis (HCC)   Acute on chronic respiratory failure with hypoxia we will continue with the weaning process T-piece will be started for 24 hours Severe COPD medical management Healthcare associated  pneumonia has been treated with antibiotics COVID-19 virus infection in recovery Severe sepsis treated resolved hemodynamics are stable   I have personally seen and evaluated the patient, evaluated laboratory and imaging results, formulated the assessment and plan and placed orders. The Patient requires high complexity decision making with multiple systems involvement.  Rounds were done with the Respiratory Therapy Director and Staff therapists and discussed with nursing staff also.  Allyne Gee, MD [facility] Pulmonary Critical Care Medicine Sleep Medicine

## 2021-12-08 DIAGNOSIS — J9620 Acute and chronic respiratory failure, unspecified whether with hypoxia or hypercapnia: Secondary | ICD-10-CM | POA: Diagnosis not present

## 2021-12-08 DIAGNOSIS — D638 Anemia in other chronic diseases classified elsewhere: Secondary | ICD-10-CM | POA: Diagnosis not present

## 2021-12-08 DIAGNOSIS — U071 COVID-19: Secondary | ICD-10-CM | POA: Diagnosis not present

## 2021-12-08 DIAGNOSIS — I10 Essential (primary) hypertension: Secondary | ICD-10-CM | POA: Diagnosis not present

## 2021-12-08 NOTE — Progress Notes (Signed)
Pulmonary Critical Care Medicine [place]   PULMONARY CRITICAL CARE SERVICE  PROGRESS NOTE     Isaac [name]  LHT:[id number]  DOB: [birth year]   DOA: 11/15/2021  Referring Physician: Satira Sark, MD  HPI: DAMEN WINDSOR is a 66 y.o. male being followed for ventilator/airway/oxygen weaning Acute on Chronic Respiratory Failure.  Patient is comfortable without distress at this time no fevers are noted  Medications: Reviewed on Rounds  Physical Exam:  Vitals: Temperature 97.1 pulse 87 respiratory 22 blood pressure is 93/62 saturations 96%  Ventilator Settings on T collar FiO2 is 40%  General: Comfortable at this time Neck: supple Cardiovascular: no malignant arrhythmias Respiratory: Scattered rhonchi expansion is equal Skin: no rash seen on limited exam Musculoskeletal: No gross abnormality Psychiatric:unable to assess Neurologic:no involuntary movements         Lab Data:   Basic Metabolic Panel: Recent Labs  Lab 12/02/21 0434 12/04/21 0601 12/07/21 0434  NA 139 139 140  K 4.5 4.2 4.5  CL 100 98 101  CO2 34* 33* 34*  GLUCOSE 95 129* 135*  BUN 24* 25* 18  CREATININE 0.68 0.91 0.67  CALCIUM 8.2* 8.0* 8.1*  MG 2.3 2.5* 2.4  PHOS 4.0 4.7* 4.0    ABG: No results for input(s): PHART, PCO2ART, PO2ART, HCO3, O2SAT in the last 168 hours.  Liver Function Tests: Recent Labs  Lab 12/07/21 0434  ALBUMIN 2.0*   No results for input(s): LIPASE, AMYLASE in the last 168 hours. No results for input(s): AMMONIA in the last 168 hours.  CBC: Recent Labs  Lab 12/02/21 0434 12/03/21 0452 12/04/21 0601 12/07/21 0434  WBC 8.5 8.5 8.1 8.0  HGB 8.0* 8.3* 8.4* 8.5*  HCT 27.2* 28.0* 28.9* 29.4*  MCV 87.5 87.2 86.8 87.5  PLT 252 283 281 247    Cardiac Enzymes: No results for input(s): CKTOTAL, CKMB, CKMBINDEX, TROPONINI in the last 168 hours.  BNP (last 3 results) Recent Labs    03/04/21 1555 08/21/21 0504  BNP 93.2 142.9*     ProBNP (last 3 results) No results for input(s): PROBNP in the last 8760 hours.  Radiological Exams: No results found.  Assessment/Plan Active Problems:   COPD, severe (HCC)   Acute on chronic respiratory failure with hypoxia (HCC)   HCAP (healthcare-associated pneumonia)   COVID-19   Severe sepsis (HCC)   Acute on chronic respiratory failure with hypoxia patient is on T collar has been on 40% FiO2 the goal is for 20 hours today Severe COPD medical management we will continue to follow along closely. Healthcare associated pneumonia has been treated with antibiotics continue present management COVID-19 virus infection in recovery phase Severe sepsis treated resolved hemodynamics are stable   I have personally seen and evaluated the patient, evaluated laboratory and imaging results, formulated the assessment and plan and placed orders. The Patient requires high complexity decision making with multiple systems involvement.  Rounds were done with the Respiratory Therapy Director and Staff therapists and discussed with nursing staff also.  Allyne Gee, MD [facility] Pulmonary Critical Care Medicine Sleep Medicine

## 2021-12-09 ENCOUNTER — Other Ambulatory Visit (HOSPITAL_COMMUNITY): Payer: BC Managed Care – PPO

## 2021-12-09 DIAGNOSIS — D638 Anemia in other chronic diseases classified elsewhere: Secondary | ICD-10-CM | POA: Diagnosis not present

## 2021-12-09 DIAGNOSIS — J9620 Acute and chronic respiratory failure, unspecified whether with hypoxia or hypercapnia: Secondary | ICD-10-CM | POA: Diagnosis not present

## 2021-12-09 DIAGNOSIS — U071 COVID-19: Secondary | ICD-10-CM | POA: Diagnosis not present

## 2021-12-09 DIAGNOSIS — R110 Nausea: Secondary | ICD-10-CM | POA: Diagnosis not present

## 2021-12-09 DIAGNOSIS — R109 Unspecified abdominal pain: Secondary | ICD-10-CM | POA: Diagnosis not present

## 2021-12-09 DIAGNOSIS — I10 Essential (primary) hypertension: Secondary | ICD-10-CM | POA: Diagnosis not present

## 2021-12-10 DIAGNOSIS — D638 Anemia in other chronic diseases classified elsewhere: Secondary | ICD-10-CM | POA: Diagnosis not present

## 2021-12-10 DIAGNOSIS — J9620 Acute and chronic respiratory failure, unspecified whether with hypoxia or hypercapnia: Secondary | ICD-10-CM | POA: Diagnosis not present

## 2021-12-10 DIAGNOSIS — I10 Essential (primary) hypertension: Secondary | ICD-10-CM | POA: Diagnosis not present

## 2021-12-10 DIAGNOSIS — U071 COVID-19: Secondary | ICD-10-CM | POA: Diagnosis not present

## 2021-12-10 NOTE — Progress Notes (Signed)
Pulmonary Critical Care Medicine [place]   PULMONARY CRITICAL CARE SERVICE  PROGRESS NOTE     Isaac [name]  LKT:[id number]  DOB: [birth year]   DOA: 11/15/2021  Referring Physician: Satira Sark, MD  HPI: Isaac [name] is a 66 y.o. male being followed for ventilator/airway/oxygen weaning Acute on Chronic Respiratory Failure.  Patient is comfortable without distress on pressure support supposed to do 16 hours of weaning  Medications: Reviewed on Rounds  Physical Exam:  Vitals: Temperature is 98.4 pulse 91 respiratory 18 blood pressure 151/65  Ventilator Settings currently on pressure support 12/5  General: Comfortable at this time Neck: supple Cardiovascular: no malignant arrhythmias Respiratory: No rhonchi no rales are noted Skin: no rash seen on limited exam Musculoskeletal: No gross abnormality Psychiatric:unable to assess Neurologic:no involuntary movements         Lab Data:   Basic Metabolic Panel: Recent Labs  Lab 12/04/21 0601 12/07/21 0434  NA 139 140  K 4.2 4.5  CL 98 101  CO2 33* 34*  GLUCOSE 129* 135*  BUN 25* 18  CREATININE 0.91 0.67  CALCIUM 8.0* 8.1*  MG 2.5* 2.4  PHOS 4.7* 4.0    ABG: No results for input(s): PHART, PCO2ART, PO2ART, HCO3, O2SAT in the last 168 hours.  Liver Function Tests: Recent Labs  Lab 12/07/21 0434  ALBUMIN 2.0*   No results for input(s): LIPASE, AMYLASE in the last 168 hours. No results for input(s): AMMONIA in the last 168 hours.  CBC: Recent Labs  Lab 12/04/21 0601 12/07/21 0434  WBC 8.1 8.0  HGB 8.4* 8.5*  HCT 28.9* 29.4*  MCV 86.8 87.5  PLT 281 247    Cardiac Enzymes: No results for input(s): CKTOTAL, CKMB, CKMBINDEX, TROPONINI in the last 168 hours.  BNP (last 3 results) Recent Labs    03/04/21 1555 08/21/21 0504  BNP 93.2 142.9*    ProBNP (last 3 results) No results for input(s): PROBNP in the last 8760 hours.  Radiological Exams: DG Abd Portable  1V  Result Date: 12/09/2021 CLINICAL DATA:  Abdominal pain, nausea EXAM: PORTABLE ABDOMEN - 1 VIEW COMPARISON:  12/02/2021 FINDINGS: There is interval placement of gastrostomy tube. Bowel gas pattern is nonspecific. There is contrast in the lumen of colon. There is increased density in the left lower lung fields suggesting pleural effusion and possibly underlying infiltrate. There is blunting of right lateral CP angle. IMPRESSION: Nonspecific bowel gas pattern.  Gastrostomy tube is noted in place. Bilateral pleural effusions, more so on the left side. Increased density in the left lower lung fields may be due to pleural effusion and possibly underlying infiltrate. Electronically Signed   By: Elmer Picker M.D.   On: 12/09/2021 13:02    Assessment/Plan Active Problems:   COPD, severe (HCC)   Acute on chronic respiratory failure with hypoxia (HCC)   HCAP (healthcare-associated pneumonia)   COVID-19   Severe sepsis (HCC)   Acute on chronic respiratory failure with hypoxia plan is going to continue with the pressure support wean goal of 16 hours Severe COPD medical management continue to follow along Healthcare associated pneumonia has been treated slow improvement COVID-19 virus infection in recovery phase Severe sepsis resolved   I have personally seen and evaluated the patient, evaluated laboratory and imaging results, formulated the assessment and plan and placed orders. The Patient requires high complexity decision making with multiple systems involvement.  Rounds were done with the Respiratory Therapy Director and Staff therapists and discussed with nursing staff also.  Allyne Gee, MD [facility] Pulmonary Critical Care Medicine Sleep Medicine

## 2021-12-11 ENCOUNTER — Other Ambulatory Visit (HOSPITAL_COMMUNITY): Payer: BC Managed Care – PPO

## 2021-12-11 DIAGNOSIS — R6520 Severe sepsis without septic shock: Secondary | ICD-10-CM | POA: Diagnosis not present

## 2021-12-11 DIAGNOSIS — J189 Pneumonia, unspecified organism: Secondary | ICD-10-CM | POA: Diagnosis not present

## 2021-12-11 DIAGNOSIS — J90 Pleural effusion, not elsewhere classified: Secondary | ICD-10-CM | POA: Diagnosis not present

## 2021-12-11 DIAGNOSIS — U071 COVID-19: Secondary | ICD-10-CM | POA: Diagnosis not present

## 2021-12-11 DIAGNOSIS — J449 Chronic obstructive pulmonary disease, unspecified: Secondary | ICD-10-CM | POA: Diagnosis not present

## 2021-12-11 DIAGNOSIS — J9621 Acute and chronic respiratory failure with hypoxia: Secondary | ICD-10-CM | POA: Diagnosis not present

## 2021-12-11 DIAGNOSIS — A419 Sepsis, unspecified organism: Secondary | ICD-10-CM | POA: Diagnosis not present

## 2021-12-11 LAB — RENAL FUNCTION PANEL
Albumin: 2 g/dL — ABNORMAL LOW (ref 3.5–5.0)
Anion gap: 6 (ref 5–15)
BUN: 28 mg/dL — ABNORMAL HIGH (ref 8–23)
CO2: 35 mmol/L — ABNORMAL HIGH (ref 22–32)
Calcium: 8.1 mg/dL — ABNORMAL LOW (ref 8.9–10.3)
Chloride: 99 mmol/L (ref 98–111)
Creatinine, Ser: 0.64 mg/dL (ref 0.61–1.24)
GFR, Estimated: >60 mL/min (ref >60)
Glucose, Bld: 119 mg/dL — ABNORMAL HIGH (ref 70–99)
Phosphorus: 3.7 mg/dL (ref 2.5–4.6)
Potassium: 4.4 mmol/L (ref 3.5–5.1)
Sodium: 140 mmol/L (ref 135–145)

## 2021-12-11 LAB — CBC
HCT: 27.8 % — ABNORMAL LOW (ref 39.0–52.0)
Hemoglobin: 7.8 g/dL — ABNORMAL LOW (ref 13.0–17.0)
MCH: 24.3 pg — ABNORMAL LOW (ref 26.0–34.0)
MCHC: 28.1 g/dL — ABNORMAL LOW (ref 30.0–36.0)
MCV: 86.6 fL (ref 80.0–100.0)
Platelets: 171 10*3/uL (ref 150–400)
RBC: 3.21 MIL/uL — ABNORMAL LOW (ref 4.22–5.81)
RDW: 15.6 % — ABNORMAL HIGH (ref 11.5–15.5)
WBC: 6.1 10*3/uL (ref 4.0–10.5)
nRBC: 0 % (ref 0.0–0.2)

## 2021-12-11 LAB — MAGNESIUM: Magnesium: 2.2 mg/dL (ref 1.7–2.4)

## 2021-12-11 NOTE — Progress Notes (Signed)
Pulmonary Critical Care Medicine Isaac [name]   PULMONARY CRITICAL CARE SERVICE  PROGRESS NOTE     Isaac [name]  QIH:[id number]  DOB: Jul 12, 1956   DOA: 11/15/2021  Referring Physician: Satira Sark, MD  HPI: Isaac [name] is a 66 y.o. male being followed for ventilator/airway/oxygen weaning Acute on Chronic Respiratory Failure.  Patient currently is on pressure support mode has been on 40% FiO2  Medications: Reviewed on Rounds  Physical Exam:  Vitals: Temperature is 97.4 pulse 93 respiratory rate is 11 blood pressure is 129/73 saturations 100%  Ventilator Settings on pressure support FiO2 40% tidal volume 567 pressure 12/5  General: Comfortable at this time Neck: supple Cardiovascular: no malignant arrhythmias Respiratory: Scattered rhonchi expansion is equal Skin: no rash seen on limited exam Musculoskeletal: No gross abnormality Psychiatric:unable to assess Neurologic:no involuntary movements         Lab Data:   Basic Metabolic Panel: Recent Labs  Lab 12/07/21 0434 12/11/21 0437  NA 140 140  K 4.5 4.4  CL 101 99  CO2 34* 35*  GLUCOSE 135* 119*  BUN 18 28*  CREATININE 0.67 0.64  CALCIUM 8.1* 8.1*  MG 2.4 2.2  PHOS 4.0 3.7    ABG: No results for input(s): PHART, PCO2ART, PO2ART, HCO3, O2SAT in the last 168 hours.  Liver Function Tests: Recent Labs  Lab 12/07/21 0434 12/11/21 0437  ALBUMIN 2.0* 2.0*   No results for input(s): LIPASE, AMYLASE in the last 168 hours. No results for input(s): AMMONIA in the last 168 hours.  CBC: Recent Labs  Lab 12/07/21 0434 12/11/21 0437  WBC 8.0 6.1  HGB 8.5* 7.8*  HCT 29.4* 27.8*  MCV 87.5 86.6  PLT 247 171    Cardiac Enzymes: No results for input(s): CKTOTAL, CKMB, CKMBINDEX, TROPONINI in the last 168 hours.  BNP (last 3 results) Recent Labs    03/04/21 1555 08/21/21 0504  BNP 93.2 142.9*    ProBNP (last 3 results) No results for input(s): PROBNP in the last 8760  hours.  Radiological Exams: DG Chest Port 1 View  Result Date: 12/11/2021 CLINICAL DATA:  Pleural effusion, pneumonia, EXAM: PORTABLE CHEST 1 VIEW COMPARISON:  12/03/2021 FINDINGS: Tracheostomy tube is stable above the carina. Heart size appears normal. Airspace densities within the right lower lung appear unchanged. Progressive, asymmetric left lung volume loss with suspected complete atelectasis of the left lower lobe. Unchanged appearance of bilateral pleural effusions, left greater than right. IMPRESSION: 1. Progressive, asymmetric left lung volume loss with suspected complete atelectasis of the left lower lobe. 2. Stable appearance of bilateral pleural effusions, left greater than right. 3. Stable aeration to the right lower lung. Electronically Signed   By: Kerby Moors M.D.   On: 12/11/2021 06:45   DG Abd Portable 1V  Result Date: 12/09/2021 CLINICAL DATA:  Abdominal pain, nausea EXAM: PORTABLE ABDOMEN - 1 VIEW COMPARISON:  12/02/2021 FINDINGS: There is interval placement of gastrostomy tube. Bowel gas pattern is nonspecific. There is contrast in the lumen of colon. There is increased density in the left lower lung fields suggesting pleural effusion and possibly underlying infiltrate. There is blunting of right lateral CP angle. IMPRESSION: Nonspecific bowel gas pattern.  Gastrostomy tube is noted in place. Bilateral pleural effusions, more so on the left side. Increased density in the left lower lung fields may be due to pleural effusion and possibly underlying infiltrate. Electronically Signed   By: Elmer Picker M.D.   On: 12/09/2021 13:02    Assessment/Plan Active  Problems:   COPD, severe (HCC)   Acute on chronic respiratory failure with hypoxia (HCC)   HCAP (healthcare-associated pneumonia)   COVID-19   Severe sepsis (HCC)   Acute on chronic respiratory failure with hypoxia plan is going to continue with the wean T collar today Severe COPD medical management Healthcare  associated pneumonia has been treated COVID-19 virus infection in recovery phase Severe sepsis treated with   I have personally seen and evaluated the patient, evaluated laboratory and imaging results, formulated the assessment and plan and placed orders. The Patient requires high complexity decision making with multiple systems involvement.  Rounds were done with the Respiratory Therapy Director and Staff therapists and discussed with nursing staff also.  Allyne Gee, MD [facility] Pulmonary Critical Care Medicine Sleep Medicine

## 2021-12-12 DIAGNOSIS — J9620 Acute and chronic respiratory failure, unspecified whether with hypoxia or hypercapnia: Secondary | ICD-10-CM | POA: Diagnosis not present

## 2021-12-12 DIAGNOSIS — J189 Pneumonia, unspecified organism: Secondary | ICD-10-CM | POA: Diagnosis not present

## 2021-12-12 DIAGNOSIS — I10 Essential (primary) hypertension: Secondary | ICD-10-CM | POA: Diagnosis not present

## 2021-12-12 DIAGNOSIS — J9621 Acute and chronic respiratory failure with hypoxia: Secondary | ICD-10-CM | POA: Diagnosis not present

## 2021-12-12 DIAGNOSIS — D638 Anemia in other chronic diseases classified elsewhere: Secondary | ICD-10-CM | POA: Diagnosis not present

## 2021-12-12 DIAGNOSIS — J449 Chronic obstructive pulmonary disease, unspecified: Secondary | ICD-10-CM | POA: Diagnosis not present

## 2021-12-12 DIAGNOSIS — A419 Sepsis, unspecified organism: Secondary | ICD-10-CM | POA: Diagnosis not present

## 2021-12-12 DIAGNOSIS — R6520 Severe sepsis without septic shock: Secondary | ICD-10-CM | POA: Diagnosis not present

## 2021-12-12 DIAGNOSIS — U071 COVID-19: Secondary | ICD-10-CM | POA: Diagnosis not present

## 2021-12-12 NOTE — Progress Notes (Signed)
Pulmonary Critical Care Medicine [place]   PULMONARY CRITICAL CARE SERVICE  PROGRESS NOTE     Isaac [name]  JJO:[id number]  DOB: 09-25-1956   DOA: 11/15/2021  Referring Physician: Satira Sark, MD  HPI: Isaac [name] is a 66 y.o. male being followed for ventilator/airway/oxygen weaning Acute on Chronic Respiratory Failure.  Patient is on the ventilator and full support trying to check the spontaneous breathing trials once again  Medications: Reviewed on Rounds  Physical Exam:  Vitals: Temperature is 98.5 pulse 92 respiratory 22 blood pressure is 133/64 saturations 95%  Ventilator Settings on assist control FiO2 is 40% tidal volume is 540 PEEP 5  General: Comfortable at this time Neck: supple Cardiovascular: no malignant arrhythmias Respiratory: Scattered rhonchi very coarse breath sounds Skin: no rash seen on limited exam Musculoskeletal: No gross abnormality Psychiatric:unable to assess Neurologic:no involuntary movements         Lab Data:   Basic Metabolic Panel: Recent Labs  Lab 12/07/21 0434 12/11/21 0437  NA 140 140  K 4.5 4.4  CL 101 99  CO2 34* 35*  GLUCOSE 135* 119*  BUN 18 28*  CREATININE 0.67 0.64  CALCIUM 8.1* 8.1*  MG 2.4 2.2  PHOS 4.0 3.7    ABG: No results for input(s): PHART, PCO2ART, PO2ART, HCO3, O2SAT in the last 168 hours.  Liver Function Tests: Recent Labs  Lab 12/07/21 0434 12/11/21 0437  ALBUMIN 2.0* 2.0*   No results for input(s): LIPASE, AMYLASE in the last 168 hours. No results for input(s): AMMONIA in the last 168 hours.  CBC: Recent Labs  Lab 12/07/21 0434 12/11/21 0437  WBC 8.0 6.1  HGB 8.5* 7.8*  HCT 29.4* 27.8*  MCV 87.5 86.6  PLT 247 171    Cardiac Enzymes: No results for input(s): CKTOTAL, CKMB, CKMBINDEX, TROPONINI in the last 168 hours.  BNP (last 3 results) Recent Labs    03/04/21 1555 08/21/21 0504  BNP 93.2 142.9*    ProBNP (last 3 results) No results for  input(s): PROBNP in the last 8760 hours.  Radiological Exams: DG Chest Port 1 View  Result Date: 12/11/2021 CLINICAL DATA:  Pleural effusion, pneumonia, EXAM: PORTABLE CHEST 1 VIEW COMPARISON:  12/03/2021 FINDINGS: Tracheostomy tube is stable above the carina. Heart size appears normal. Airspace densities within the right lower lung appear unchanged. Progressive, asymmetric left lung volume loss with suspected complete atelectasis of the left lower lobe. Unchanged appearance of bilateral pleural effusions, left greater than right. IMPRESSION: 1. Progressive, asymmetric left lung volume loss with suspected complete atelectasis of the left lower lobe. 2. Stable appearance of bilateral pleural effusions, left greater than right. 3. Stable aeration to the right lower lung. Electronically Signed   By: Kerby Moors M.D.   On: 12/11/2021 06:45    Assessment/Plan Active Problems:   COPD, severe (HCC)   Acute on chronic respiratory failure with hypoxia (HCC)   HCAP (healthcare-associated pneumonia)   COVID-19   Severe sepsis (HCC)   Acute on chronic respiratory failure hypoxia plan is to continue with assist control mode patient currently is on 40% oxygen good saturations Severe COPD medical management Healthcare associated pneumonia has been treated COVID-19 virus infection in recovery Severe sepsis treated and resolved   I have personally seen and evaluated the patient, evaluated laboratory and imaging results, formulated the assessment and plan and placed orders. The Patient requires high complexity decision making with multiple systems involvement.  Rounds were done with the Respiratory Therapy Director and Staff  therapists and discussed with nursing staff also.  Allyne Gee, MD [facility] Pulmonary Critical Care Medicine Sleep Medicine

## 2021-12-13 DIAGNOSIS — U071 COVID-19: Secondary | ICD-10-CM | POA: Diagnosis not present

## 2021-12-13 DIAGNOSIS — L89151 Pressure ulcer of sacral region, stage 1: Secondary | ICD-10-CM | POA: Diagnosis not present

## 2021-12-13 DIAGNOSIS — J9620 Acute and chronic respiratory failure, unspecified whether with hypoxia or hypercapnia: Secondary | ICD-10-CM | POA: Diagnosis not present

## 2021-12-13 DIAGNOSIS — J189 Pneumonia, unspecified organism: Secondary | ICD-10-CM | POA: Diagnosis not present

## 2021-12-13 DIAGNOSIS — J449 Chronic obstructive pulmonary disease, unspecified: Secondary | ICD-10-CM | POA: Diagnosis not present

## 2021-12-13 DIAGNOSIS — R6520 Severe sepsis without septic shock: Secondary | ICD-10-CM | POA: Diagnosis not present

## 2021-12-13 DIAGNOSIS — I10 Essential (primary) hypertension: Secondary | ICD-10-CM | POA: Diagnosis not present

## 2021-12-13 DIAGNOSIS — A419 Sepsis, unspecified organism: Secondary | ICD-10-CM | POA: Diagnosis not present

## 2021-12-13 DIAGNOSIS — J9621 Acute and chronic respiratory failure with hypoxia: Secondary | ICD-10-CM | POA: Diagnosis not present

## 2021-12-13 NOTE — Progress Notes (Signed)
Pulmonary Critical Care Medicine [place]   PULMONARY CRITICAL CARE SERVICE  PROGRESS NOTE     JEFFRY VOGELSANG  OHY:[id number]  DOB: [birth year]   DOA: 11/15/2021  Referring Physician: Satira Sark, MD  HPI: Isaac [name] is a 66 y.o. male being followed for ventilator/airway/oxygen weaning Acute on Chronic Respiratory Failure.  Patient is on T collar the goal is for 4 hours resting on assist control mode otherwise  Medications: Reviewed on Rounds  Physical Exam:  Vitals: Temperature 97.2 pulse 83 respiratory rate 22 blood pressure is 125/72 saturations 98%  Ventilator Settings assist-control FiO2 40% tidal volume 400  General: Comfortable at this time Neck: supple Cardiovascular: no malignant arrhythmias Respiratory: Coarse rhonchi expansion is equal Skin: no rash seen on limited exam Musculoskeletal: No gross abnormality Psychiatric:unable to assess Neurologic:no involuntary movements         Lab Data:   Basic Metabolic Panel: Recent Labs  Lab 12/07/21 0434 12/11/21 0437  NA 140 140  K 4.5 4.4  CL 101 99  CO2 34* 35*  GLUCOSE 135* 119*  BUN 18 28*  CREATININE 0.67 0.64  CALCIUM 8.1* 8.1*  MG 2.4 2.2  PHOS 4.0 3.7    ABG: No results for input(s): PHART, PCO2ART, PO2ART, HCO3, O2SAT in the last 168 hours.  Liver Function Tests: Recent Labs  Lab 12/07/21 0434 12/11/21 0437  ALBUMIN 2.0* 2.0*   No results for input(s): LIPASE, AMYLASE in the last 168 hours. No results for input(s): AMMONIA in the last 168 hours.  CBC: Recent Labs  Lab 12/07/21 0434 12/11/21 0437  WBC 8.0 6.1  HGB 8.5* 7.8*  HCT 29.4* 27.8*  MCV 87.5 86.6  PLT 247 171    Cardiac Enzymes: No results for input(s): CKTOTAL, CKMB, CKMBINDEX, TROPONINI in the last 168 hours.  BNP (last 3 results) Recent Labs    03/04/21 1555 08/21/21 0504  BNP 93.2 142.9*    ProBNP (last 3 results) No results for input(s): PROBNP in the last 8760  hours.  Radiological Exams: No results found.  Assessment/Plan Active Problems:   COPD, severe (HCC)   Acute on chronic respiratory failure with hypoxia (HCC)   HCAP (healthcare-associated pneumonia)   COVID-19   Severe sepsis (HCC)   Acute on chronic respiratory failure with hypoxia the plan is going to be to continue with the weaning protocol patient supposed to do T collar when again today which will be advanced as tolerated Severe COPD medical management we will continue to follow along closely. Healthcare associated pneumonia treated we will continue with supportive care COVID-19 virus infection at baseline Severe sepsis treated resolved hemodynamics are stable   I have personally seen and evaluated the patient, evaluated laboratory and imaging results, formulated the assessment and plan and placed orders. The Patient requires high complexity decision making with multiple systems involvement.  Rounds were done with the Respiratory Therapy Director and Staff therapists and discussed with nursing staff also.  Allyne Gee, MD [facility] Pulmonary Critical Care Medicine Sleep Medicine

## 2021-12-14 ENCOUNTER — Other Ambulatory Visit (HOSPITAL_COMMUNITY): Payer: BC Managed Care – PPO

## 2021-12-14 DIAGNOSIS — J189 Pneumonia, unspecified organism: Secondary | ICD-10-CM | POA: Diagnosis not present

## 2021-12-14 DIAGNOSIS — L89151 Pressure ulcer of sacral region, stage 1: Secondary | ICD-10-CM | POA: Diagnosis not present

## 2021-12-14 DIAGNOSIS — U071 COVID-19: Secondary | ICD-10-CM | POA: Diagnosis not present

## 2021-12-14 DIAGNOSIS — J9621 Acute and chronic respiratory failure with hypoxia: Secondary | ICD-10-CM | POA: Diagnosis not present

## 2021-12-14 DIAGNOSIS — A419 Sepsis, unspecified organism: Secondary | ICD-10-CM | POA: Diagnosis not present

## 2021-12-14 DIAGNOSIS — J9620 Acute and chronic respiratory failure, unspecified whether with hypoxia or hypercapnia: Secondary | ICD-10-CM | POA: Diagnosis not present

## 2021-12-14 DIAGNOSIS — R6520 Severe sepsis without septic shock: Secondary | ICD-10-CM | POA: Diagnosis not present

## 2021-12-14 DIAGNOSIS — J449 Chronic obstructive pulmonary disease, unspecified: Secondary | ICD-10-CM | POA: Diagnosis not present

## 2021-12-14 DIAGNOSIS — E46 Unspecified protein-calorie malnutrition: Secondary | ICD-10-CM | POA: Diagnosis not present

## 2021-12-14 DIAGNOSIS — J90 Pleural effusion, not elsewhere classified: Secondary | ICD-10-CM | POA: Diagnosis not present

## 2021-12-14 LAB — BASIC METABOLIC PANEL
Anion gap: 8 (ref 5–15)
BUN: 24 mg/dL — ABNORMAL HIGH (ref 8–23)
CO2: 34 mmol/L — ABNORMAL HIGH (ref 22–32)
Calcium: 8.2 mg/dL — ABNORMAL LOW (ref 8.9–10.3)
Chloride: 99 mmol/L (ref 98–111)
Creatinine, Ser: 0.56 mg/dL — ABNORMAL LOW (ref 0.61–1.24)
GFR, Estimated: >60 mL/min (ref >60)
Glucose, Bld: 127 mg/dL — ABNORMAL HIGH (ref 70–99)
Potassium: 4.4 mmol/L (ref 3.5–5.1)
Sodium: 141 mmol/L (ref 135–145)

## 2021-12-14 LAB — CBC
HCT: 26.5 % — ABNORMAL LOW (ref 39.0–52.0)
Hemoglobin: 7.9 g/dL — ABNORMAL LOW (ref 13.0–17.0)
MCH: 25.1 pg — ABNORMAL LOW (ref 26.0–34.0)
MCHC: 29.8 g/dL — ABNORMAL LOW (ref 30.0–36.0)
MCV: 84.1 fL (ref 80.0–100.0)
Platelets: 120 10*3/uL — ABNORMAL LOW (ref 150–400)
RBC: 3.15 MIL/uL — ABNORMAL LOW (ref 4.22–5.81)
RDW: 15.5 % (ref 11.5–15.5)
WBC: 5.9 10*3/uL (ref 4.0–10.5)
nRBC: 0 % (ref 0.0–0.2)

## 2021-12-14 LAB — MAGNESIUM: Magnesium: 2 mg/dL (ref 1.7–2.4)

## 2021-12-14 LAB — PHOSPHORUS: Phosphorus: 3.9 mg/dL (ref 2.5–4.6)

## 2021-12-14 NOTE — Progress Notes (Signed)
Pulmonary Critical Care Medicine [place]   PULMONARY CRITICAL CARE SERVICE  PROGRESS NOTE     Isaac [name]  KXF:[id number]  DOB: 1956/01/06   DOA: 11/15/2021  Referring Physician: Satira Sark, MD  HPI: Isaac [name] is a 66 y.o. male being followed for ventilator/airway/oxygen weaning Acute on Chronic Respiratory Failure.  Patient did 4 hours of T collar yesterday right now is resting on assist control  Medications: Reviewed on Rounds  Physical Exam:  Vitals: Temperature 97.7 pulse 87 respiratory 17 blood pressure is 105/61 saturations 96%  Ventilator Settings assist control FiO2 40% tidal volume 450 PEEP 5  General: Comfortable at this time Neck: supple Cardiovascular: no malignant arrhythmias Respiratory: No rhonchi very coarse breath sounds Skin: no rash seen on limited exam Musculoskeletal: No gross abnormality Psychiatric:unable to assess Neurologic:no involuntary movements         Lab Data:   Basic Metabolic Panel: Recent Labs  Lab 12/11/21 0437 12/14/21 0323  NA 140 141  K 4.4 4.4  CL 99 99  CO2 35* 34*  GLUCOSE 119* 127*  BUN 28* 24*  CREATININE 0.64 0.56*  CALCIUM 8.1* 8.2*  MG 2.2 2.0  PHOS 3.7 3.9    ABG: No results for input(s): PHART, PCO2ART, PO2ART, HCO3, O2SAT in the last 168 hours.  Liver Function Tests: Recent Labs  Lab 12/11/21 0437  ALBUMIN 2.0*   No results for input(s): LIPASE, AMYLASE in the last 168 hours. No results for input(s): AMMONIA in the last 168 hours.  CBC: Recent Labs  Lab 12/11/21 0437 12/14/21 0323  WBC 6.1 5.9  HGB 7.8* 7.9*  HCT 27.8* 26.5*  MCV 86.6 84.1  PLT 171 120*    Cardiac Enzymes: No results for input(s): CKTOTAL, CKMB, CKMBINDEX, TROPONINI in the last 168 hours.  BNP (last 3 results) Recent Labs    03/04/21 1555 08/21/21 0504  BNP 93.2 142.9*    ProBNP (last 3 results) No results for input(s): PROBNP in the last 8760 hours.  Radiological  Exams: DG Chest Port 1 View  Result Date: 12/14/2021 CLINICAL DATA:  Pneumonia EXAM: PORTABLE CHEST 1 VIEW COMPARISON:  12/11/2021 FINDINGS: Tracheostomy tube in satisfactory position. Stable cardiomediastinal silhouette. Right lower lobe airspace disease. Patchy left perihilar and left lower lobe airspace disease. Small bilateral pleural effusions. No pneumothorax. No acute osseous abnormality. IMPRESSION: Bilateral lower lobe and left perihilar airspace disease concerning for multilobar pneumonia. Electronically Signed   By: Kathreen Devoid M.D.   On: 12/14/2021 06:08    Assessment/Plan Active Problems:   COPD, severe (HCC)   Acute on chronic respiratory failure with hypoxia (HCC)   HCAP (healthcare-associated pneumonia)   COVID-19   Severe sepsis (HCC)   Acute on chronic respiratory failure hypoxia plan is going to be to continue with wean protocol as tolerated patient seems to be doing a little bit better yesterday the goal is for 4 hours Severe COPD medical management continue to monitor along closely. Healthcare associated pneumonia has been treated with antibiotics Severe sepsis treated resolved COVID-19 virus infection in recovery   I have personally seen and evaluated the patient, evaluated laboratory and imaging results, formulated the assessment and plan and placed orders. The Patient requires high complexity decision making with multiple systems involvement.  Rounds were done with the Respiratory Therapy Director and Staff therapists and discussed with nursing staff also.  Allyne Gee, MD [facility] Pulmonary Critical Care Medicine Sleep Medicine

## 2021-12-15 DIAGNOSIS — U071 COVID-19: Secondary | ICD-10-CM | POA: Diagnosis not present

## 2021-12-15 DIAGNOSIS — L89151 Pressure ulcer of sacral region, stage 1: Secondary | ICD-10-CM | POA: Diagnosis not present

## 2021-12-15 DIAGNOSIS — J189 Pneumonia, unspecified organism: Secondary | ICD-10-CM | POA: Diagnosis not present

## 2021-12-15 DIAGNOSIS — A419 Sepsis, unspecified organism: Secondary | ICD-10-CM | POA: Diagnosis not present

## 2021-12-15 DIAGNOSIS — J9620 Acute and chronic respiratory failure, unspecified whether with hypoxia or hypercapnia: Secondary | ICD-10-CM | POA: Diagnosis not present

## 2021-12-15 DIAGNOSIS — J9621 Acute and chronic respiratory failure with hypoxia: Secondary | ICD-10-CM | POA: Diagnosis not present

## 2021-12-15 DIAGNOSIS — J449 Chronic obstructive pulmonary disease, unspecified: Secondary | ICD-10-CM | POA: Diagnosis not present

## 2021-12-15 DIAGNOSIS — R6520 Severe sepsis without septic shock: Secondary | ICD-10-CM | POA: Diagnosis not present

## 2021-12-15 NOTE — Progress Notes (Signed)
Pulmonary Critical Care Medicine [place]   PULMONARY CRITICAL CARE SERVICE  PROGRESS NOTE     ANTON CHERAMIE  HRC:[id number]  DOB: [birth year]   DOA: 11/15/2021  Referring Physician: Satira Sark, MD  HPI: Isaac [name] is a 66 y.o. male being followed for ventilator/airway/oxygen weaning Acute on Chronic Respiratory Failure.  Patient is on assist control mode was attempted on T-piece but failed  Medications: Reviewed on Rounds  Physical Exam:  Vitals: Temperature 97.7 pulse 91 respiratory 27 blood pressure is 137/87 saturations 97%  Ventilator Settings on assist control FiO2 is 40% tidal volume is 580 PEEP 5  General: Comfortable at this time Neck: supple Cardiovascular: no malignant arrhythmias Respiratory: No rhonchi no rales are noted at this time Skin: no rash seen on limited exam Musculoskeletal: No gross abnormality Psychiatric:unable to assess Neurologic:no involuntary movements         Lab Data:   Basic Metabolic Panel: Recent Labs  Lab 12/11/21 0437 12/14/21 0323  NA 140 141  K 4.4 4.4  CL 99 99  CO2 35* 34*  GLUCOSE 119* 127*  BUN 28* 24*  CREATININE 0.64 0.56*  CALCIUM 8.1* 8.2*  MG 2.2 2.0  PHOS 3.7 3.9    ABG: No results for input(s): PHART, PCO2ART, PO2ART, HCO3, O2SAT in the last 168 hours.  Liver Function Tests: Recent Labs  Lab 12/11/21 0437  ALBUMIN 2.0*   No results for input(s): LIPASE, AMYLASE in the last 168 hours. No results for input(s): AMMONIA in the last 168 hours.  CBC: Recent Labs  Lab 12/11/21 0437 12/14/21 0323  WBC 6.1 5.9  HGB 7.8* 7.9*  HCT 27.8* 26.5*  MCV 86.6 84.1  PLT 171 120*    Cardiac Enzymes: No results for input(s): CKTOTAL, CKMB, CKMBINDEX, TROPONINI in the last 168 hours.  BNP (last 3 results) Recent Labs    03/04/21 1555 08/21/21 0504  BNP 93.2 142.9*    ProBNP (last 3 results) No results for input(s): PROBNP in the last 8760 hours.  Radiological  Exams: DG Chest Port 1 View  Result Date: 12/14/2021 CLINICAL DATA:  Pneumonia EXAM: PORTABLE CHEST 1 VIEW COMPARISON:  12/11/2021 FINDINGS: Tracheostomy tube in satisfactory position. Stable cardiomediastinal silhouette. Right lower lobe airspace disease. Patchy left perihilar and left lower lobe airspace disease. Small bilateral pleural effusions. No pneumothorax. No acute osseous abnormality. IMPRESSION: Bilateral lower lobe and left perihilar airspace disease concerning for multilobar pneumonia. Electronically Signed   By: Kathreen Devoid M.D.   On: 12/14/2021 06:08    Assessment/Plan Active Problems:   COPD, severe (HCC)   Acute on chronic respiratory failure with hypoxia (HCC)   HCAP (healthcare-associated pneumonia)   COVID-19   Severe sepsis (HCC)   Acute on chronic respiratory failure with hypoxia plan is to continue with assist control mode on 40% FiO2 we will continue secretion management pulmonary toilet Severe COPD medical management continue to follow along closely Healthcare associated pneumonia has been treated continue to monitor COVID-19 virus infection in recovery Severe sepsis treated resolved   I have personally seen and evaluated the patient, evaluated laboratory and imaging results, formulated the assessment and plan and placed orders. The Patient requires high complexity decision making with multiple systems involvement.  Rounds were done with the Respiratory Therapy Director and Staff therapists and discussed with nursing staff also.  Allyne Gee, MD [facility] Pulmonary Critical Care Medicine Sleep Medicine

## 2021-12-16 DIAGNOSIS — J449 Chronic obstructive pulmonary disease, unspecified: Secondary | ICD-10-CM | POA: Diagnosis not present

## 2021-12-16 DIAGNOSIS — L89151 Pressure ulcer of sacral region, stage 1: Secondary | ICD-10-CM | POA: Diagnosis not present

## 2021-12-16 DIAGNOSIS — J9621 Acute and chronic respiratory failure with hypoxia: Secondary | ICD-10-CM | POA: Diagnosis not present

## 2021-12-16 DIAGNOSIS — J189 Pneumonia, unspecified organism: Secondary | ICD-10-CM | POA: Diagnosis not present

## 2021-12-16 DIAGNOSIS — J9620 Acute and chronic respiratory failure, unspecified whether with hypoxia or hypercapnia: Secondary | ICD-10-CM | POA: Diagnosis not present

## 2021-12-16 DIAGNOSIS — R6520 Severe sepsis without septic shock: Secondary | ICD-10-CM | POA: Diagnosis not present

## 2021-12-16 DIAGNOSIS — E46 Unspecified protein-calorie malnutrition: Secondary | ICD-10-CM | POA: Diagnosis not present

## 2021-12-16 DIAGNOSIS — A419 Sepsis, unspecified organism: Secondary | ICD-10-CM | POA: Diagnosis not present

## 2021-12-16 DIAGNOSIS — U071 COVID-19: Secondary | ICD-10-CM | POA: Diagnosis not present

## 2021-12-16 NOTE — Progress Notes (Signed)
Pulmonary Critical Care Medicine [place]   PULMONARY CRITICAL CARE SERVICE  PROGRESS NOTE     Isaac [name]  [id number]:[id number]  DOB: [birth year]   DOA: 11/15/2021  Referring Physician: Satira Sark, MD  HPI: Isaac [name] is a 66 y.o. male being followed for ventilator/airway/oxygen weaning Acute on Chronic Respiratory Failure.  Patient is on assist control mode has been on 50% FiO2  Medications: Reviewed on Rounds  Physical Exam:  Vitals: Temperature is 97.4 pulse 72 respiratory to 16 blood pressure is 111/68 saturations 94%  Ventilator Settings on assist control FiO2 is 50% tidal volume 580 PEEP 5  General: Comfortable at this time Neck: supple Cardiovascular: no malignant arrhythmias Respiratory: Coarse rhonchi are noted bilaterally Skin: no rash seen on limited exam Musculoskeletal: No gross abnormality Psychiatric:unable to assess Neurologic:no involuntary movements         Lab Data:   Basic Metabolic Panel: Recent Labs  Lab 12/11/21 0437 12/14/21 0323  NA 140 141  K 4.4 4.4  CL 99 99  CO2 35* 34*  GLUCOSE 119* 127*  BUN 28* 24*  CREATININE 0.64 0.56*  CALCIUM 8.1* 8.2*  MG 2.2 2.0  PHOS 3.7 3.9    ABG: No results for input(s): PHART, PCO2ART, PO2ART, HCO3, O2SAT in the last 168 hours.  Liver Function Tests: Recent Labs  Lab 12/11/21 0437  ALBUMIN 2.0*   No results for input(s): LIPASE, AMYLASE in the last 168 hours. No results for input(s): AMMONIA in the last 168 hours.  CBC: Recent Labs  Lab 12/11/21 0437 12/14/21 0323  WBC 6.1 5.9  HGB 7.8* 7.9*  HCT 27.8* 26.5*  MCV 86.6 84.1  PLT 171 120*    Cardiac Enzymes: No results for input(s): CKTOTAL, CKMB, CKMBINDEX, TROPONINI in the last 168 hours.  BNP (last 3 results) Recent Labs    03/04/21 1555 08/21/21 0504  BNP 93.2 142.9*    ProBNP (last 3 results) No results for input(s): PROBNP in the last 8760 hours.  Radiological Exams: No results  found.  Assessment/Plan Active Problems:   COPD, severe (HCC)   Acute on chronic respiratory failure with hypoxia (HCC)   HCAP (healthcare-associated pneumonia)   COVID-19   Severe sepsis (HCC)   Acute on chronic respiratory failure hypoxia plan is to try weaning patient was supposed to have done T-piece yesterday did not do well we will have respiratory therapy reassess Severe COPD medical management continue to monitor closely. Healthcare associated pneumonia has been treated slowly improving Severe sepsis treated resolved hemodynamics are stable COVID-19 virus infection in recovery although quite slow   I have personally seen and evaluated the patient, evaluated laboratory and imaging results, formulated the assessment and plan and placed orders. The Patient requires high complexity decision making with multiple systems involvement.  Rounds were done with the Respiratory Therapy Director and Staff therapists and discussed with nursing staff also.  Allyne Gee, MD [facility] Pulmonary Critical Care Medicine Sleep Medicine

## 2021-12-17 DIAGNOSIS — J9620 Acute and chronic respiratory failure, unspecified whether with hypoxia or hypercapnia: Secondary | ICD-10-CM | POA: Diagnosis not present

## 2021-12-17 DIAGNOSIS — F419 Anxiety disorder, unspecified: Secondary | ICD-10-CM | POA: Diagnosis not present

## 2021-12-17 DIAGNOSIS — E46 Unspecified protein-calorie malnutrition: Secondary | ICD-10-CM | POA: Diagnosis not present

## 2021-12-17 DIAGNOSIS — U071 COVID-19: Secondary | ICD-10-CM | POA: Diagnosis not present

## 2021-12-18 ENCOUNTER — Other Ambulatory Visit (HOSPITAL_COMMUNITY): Payer: Self-pay

## 2021-12-18 DIAGNOSIS — J449 Chronic obstructive pulmonary disease, unspecified: Secondary | ICD-10-CM | POA: Diagnosis not present

## 2021-12-18 DIAGNOSIS — J9620 Acute and chronic respiratory failure, unspecified whether with hypoxia or hypercapnia: Secondary | ICD-10-CM | POA: Diagnosis not present

## 2021-12-18 DIAGNOSIS — J9621 Acute and chronic respiratory failure with hypoxia: Secondary | ICD-10-CM | POA: Diagnosis not present

## 2021-12-18 DIAGNOSIS — U071 COVID-19: Secondary | ICD-10-CM | POA: Diagnosis not present

## 2021-12-18 DIAGNOSIS — R6520 Severe sepsis without septic shock: Secondary | ICD-10-CM | POA: Diagnosis not present

## 2021-12-18 DIAGNOSIS — D649 Anemia, unspecified: Secondary | ICD-10-CM | POA: Diagnosis not present

## 2021-12-18 DIAGNOSIS — J189 Pneumonia, unspecified organism: Secondary | ICD-10-CM | POA: Diagnosis not present

## 2021-12-18 DIAGNOSIS — A419 Sepsis, unspecified organism: Secondary | ICD-10-CM | POA: Diagnosis not present

## 2021-12-18 LAB — RENAL FUNCTION PANEL
Albumin: 2.3 g/dL — ABNORMAL LOW (ref 3.5–5.0)
Anion gap: 8 (ref 5–15)
BUN: 28 mg/dL — ABNORMAL HIGH (ref 8–23)
CO2: 35 mmol/L — ABNORMAL HIGH (ref 22–32)
Calcium: 8.3 mg/dL — ABNORMAL LOW (ref 8.9–10.3)
Chloride: 96 mmol/L — ABNORMAL LOW (ref 98–111)
Creatinine, Ser: 0.69 mg/dL (ref 0.61–1.24)
GFR, Estimated: >60 mL/min (ref >60)
Glucose, Bld: 104 mg/dL — ABNORMAL HIGH (ref 70–99)
Phosphorus: 4.2 mg/dL (ref 2.5–4.6)
Potassium: 5.1 mmol/L (ref 3.5–5.1)
Sodium: 139 mmol/L (ref 135–145)

## 2021-12-18 LAB — CBC
HCT: 26.4 % — ABNORMAL LOW (ref 39.0–52.0)
Hemoglobin: 7.6 g/dL — ABNORMAL LOW (ref 13.0–17.0)
MCH: 24.3 pg — ABNORMAL LOW (ref 26.0–34.0)
MCHC: 28.8 g/dL — ABNORMAL LOW (ref 30.0–36.0)
MCV: 84.3 fL (ref 80.0–100.0)
Platelets: 144 10*3/uL — ABNORMAL LOW (ref 150–400)
RBC: 3.13 MIL/uL — ABNORMAL LOW (ref 4.22–5.81)
RDW: 15.7 % — ABNORMAL HIGH (ref 11.5–15.5)
WBC: 7 10*3/uL (ref 4.0–10.5)
nRBC: 0 % (ref 0.0–0.2)

## 2021-12-18 LAB — CULTURE, RESPIRATORY W GRAM STAIN

## 2021-12-18 LAB — MAGNESIUM: Magnesium: 2.4 mg/dL (ref 1.7–2.4)

## 2021-12-18 NOTE — Progress Notes (Signed)
Pulmonary Critical Care Medicine [place]   PULMONARY CRITICAL CARE SERVICE  PROGRESS NOTE     Isaac [name]  [id number]:[id number]  DOB: May 21, 1956   DOA: 11/15/2021  Referring Physician: Satira Sark, MD  HPI: Isaac [name] is a 66 y.o. male being followed for ventilator/airway/oxygen weaning Acute on Chronic Respiratory Failure.  Patient is on T collar currently on 40% FiO2 supposed be doing 8 hours  Medications: Reviewed on Rounds  Physical Exam:  Vitals: Temperature is 98.1 pulse 97 respiratory 17 blood pressure is 112/63 saturations 93%  Ventilator Settings on T-piece on 40% FiO2  General: Comfortable at this time Neck: supple Cardiovascular: no malignant arrhythmias Respiratory: Scattered rhonchi expansion is equal Skin: no rash seen on limited exam Musculoskeletal: No gross abnormality Psychiatric:unable to assess Neurologic:no involuntary movements         Lab Data:   Basic Metabolic Panel: Recent Labs  Lab 12/14/21 0323 12/18/21 0432  NA 141 139  K 4.4 5.1  CL 99 96*  CO2 34* 35*  GLUCOSE 127* 104*  BUN 24* 28*  CREATININE 0.56* 0.69  CALCIUM 8.2* 8.3*  MG 2.0 2.4  PHOS 3.9 4.2    ABG: No results for input(s): PHART, PCO2ART, PO2ART, HCO3, O2SAT in the last 168 hours.  Liver Function Tests: Recent Labs  Lab 12/18/21 0432  ALBUMIN 2.3*   No results for input(s): LIPASE, AMYLASE in the last 168 hours. No results for input(s): AMMONIA in the last 168 hours.  CBC: Recent Labs  Lab 12/14/21 0323 12/18/21 0432  WBC 5.9 7.0  HGB 7.9* 7.6*  HCT 26.5* 26.4*  MCV 84.1 84.3  PLT 120* 144*    Cardiac Enzymes: No results for input(s): CKTOTAL, CKMB, CKMBINDEX, TROPONINI in the last 168 hours.  BNP (last 3 results) Recent Labs    03/04/21 1555 08/21/21 0504  BNP 93.2 142.9*    ProBNP (last 3 results) No results for input(s): PROBNP in the last 8760 hours.  Radiological Exams: No results  found.  Assessment/Plan Active Problems:   COPD, severe (HCC)   Acute on chronic respiratory failure with hypoxia (HCC)   HCAP (healthcare-associated pneumonia)   COVID-19   Severe sepsis (HCC)   Acute on chronic respiratory failure hypoxia plan is to continue with the T-piece phone as tolerated.  Patient is on 35% FiO2 doing well Severe COPD nebulizers and medical management we will continue to follow Healthcare associated pneumonia has been treated we will continue to monitor closely COVID-19 virus infection in recovery phase Severe sepsis resolved hemodynamics are stable   I have personally seen and evaluated the patient, evaluated laboratory and imaging results, formulated the assessment and plan and placed orders. The Patient requires high complexity decision making with multiple systems involvement.  Rounds were done with the Respiratory Therapy Director and Staff therapists and discussed with nursing staff also.  Isaac Gee, MD [facility] Pulmonary Critical Care Medicine Sleep Medicine

## 2021-12-19 DIAGNOSIS — A419 Sepsis, unspecified organism: Secondary | ICD-10-CM | POA: Diagnosis not present

## 2021-12-19 DIAGNOSIS — U071 COVID-19: Secondary | ICD-10-CM | POA: Diagnosis not present

## 2021-12-19 DIAGNOSIS — D649 Anemia, unspecified: Secondary | ICD-10-CM | POA: Diagnosis not present

## 2021-12-19 DIAGNOSIS — J9620 Acute and chronic respiratory failure, unspecified whether with hypoxia or hypercapnia: Secondary | ICD-10-CM | POA: Diagnosis not present

## 2021-12-19 DIAGNOSIS — J189 Pneumonia, unspecified organism: Secondary | ICD-10-CM | POA: Diagnosis not present

## 2021-12-19 DIAGNOSIS — J449 Chronic obstructive pulmonary disease, unspecified: Secondary | ICD-10-CM | POA: Diagnosis not present

## 2021-12-19 DIAGNOSIS — J9621 Acute and chronic respiratory failure with hypoxia: Secondary | ICD-10-CM | POA: Diagnosis not present

## 2021-12-19 DIAGNOSIS — R6520 Severe sepsis without septic shock: Secondary | ICD-10-CM | POA: Diagnosis not present

## 2021-12-19 NOTE — Progress Notes (Signed)
Pulmonary Critical Care Medicine [place]   PULMONARY CRITICAL CARE SERVICE  PROGRESS NOTE     Isaac [name]  [id number]  DOB: 12/25/1955   DOA: 11/15/2021  Referring Physician: Satira Sark, MD  HPI: Isaac [name] is a 66 y.o. male being followed for ventilator/airway/oxygen weaning Acute on Chronic Respiratory Failure.  Patient is afebrile comfortable without distress has been on T-piece goal today is for 8 hours  Medications: Reviewed on Rounds  Physical Exam:  Vitals: Temperature is 97.7 pulse 82 respiratory 25 blood pressure is 132/81 saturations 98%  Ventilator Settings T-piece FiO2 40%  General: Comfortable at this time Neck: supple Cardiovascular: no malignant arrhythmias Respiratory: Scattered rhonchi expansion is equal Skin: no rash seen on limited exam Musculoskeletal: No gross abnormality Psychiatric:unable to assess Neurologic:no involuntary movements         Lab Data:   Basic Metabolic Panel: Recent Labs  Lab 12/14/21 0323 12/18/21 0432  NA 141 139  K 4.4 5.1  CL 99 96*  CO2 34* 35*  GLUCOSE 127* 104*  BUN 24* 28*  CREATININE 0.56* 0.69  CALCIUM 8.2* 8.3*  MG 2.0 2.4  PHOS 3.9 4.2    ABG: No results for input(s): PHART, PCO2ART, PO2ART, HCO3, O2SAT in the last 168 hours.  Liver Function Tests: Recent Labs  Lab 12/18/21 0432  ALBUMIN 2.3*   No results for input(s): LIPASE, AMYLASE in the last 168 hours. No results for input(s): AMMONIA in the last 168 hours.  CBC: Recent Labs  Lab 12/14/21 0323 12/18/21 0432  WBC 5.9 7.0  HGB 7.9* 7.6*  HCT 26.5* 26.4*  MCV 84.1 84.3  PLT 120* 144*    Cardiac Enzymes: No results for input(s): CKTOTAL, CKMB, CKMBINDEX, TROPONINI in the last 168 hours.  BNP (last 3 results) Recent Labs    03/04/21 1555 08/21/21 0504  BNP 93.2 142.9*    ProBNP (last 3 results) No results for input(s): PROBNP in the last 8760 hours.  Radiological Exams: No results  found.  Assessment/Plan Active Problems:   COPD, severe (HCC)   Acute on chronic respiratory failure with hypoxia (HCC)   HCAP (healthcare-associated pneumonia)   COVID-19   Severe sepsis (HCC)   Acute on chronic respiratory failure with hypoxia plan is to continue to wean patient supposed to do 8 hours of T-piece today Severe COPD medical management continue to follow along Healthcare associated pneumonia has been treated with antibiotics COVID-19 virus infection recovering Severe sepsis resolved hemodynamics are stable   I have personally seen and evaluated the patient, evaluated laboratory and imaging results, formulated the assessment and plan and placed orders. The Patient requires high complexity decision making with multiple systems involvement.  Rounds were done with the Respiratory Therapy Director and Staff therapists and discussed with nursing staff also.  Allyne Gee, MD [facility] Pulmonary Critical Care Medicine Sleep Medicine

## 2021-12-20 ENCOUNTER — Other Ambulatory Visit (HOSPITAL_COMMUNITY): Payer: BC Managed Care – PPO

## 2021-12-20 DIAGNOSIS — U071 COVID-19: Secondary | ICD-10-CM | POA: Diagnosis not present

## 2021-12-20 DIAGNOSIS — J90 Pleural effusion, not elsewhere classified: Secondary | ICD-10-CM | POA: Diagnosis not present

## 2021-12-20 DIAGNOSIS — J449 Chronic obstructive pulmonary disease, unspecified: Secondary | ICD-10-CM | POA: Diagnosis not present

## 2021-12-20 DIAGNOSIS — J189 Pneumonia, unspecified organism: Secondary | ICD-10-CM | POA: Diagnosis not present

## 2021-12-20 DIAGNOSIS — J9620 Acute and chronic respiratory failure, unspecified whether with hypoxia or hypercapnia: Secondary | ICD-10-CM | POA: Diagnosis not present

## 2021-12-20 DIAGNOSIS — R6520 Severe sepsis without septic shock: Secondary | ICD-10-CM | POA: Diagnosis not present

## 2021-12-20 DIAGNOSIS — J9621 Acute and chronic respiratory failure with hypoxia: Secondary | ICD-10-CM | POA: Diagnosis not present

## 2021-12-20 DIAGNOSIS — D638 Anemia in other chronic diseases classified elsewhere: Secondary | ICD-10-CM | POA: Diagnosis not present

## 2021-12-20 DIAGNOSIS — E46 Unspecified protein-calorie malnutrition: Secondary | ICD-10-CM | POA: Diagnosis not present

## 2021-12-20 DIAGNOSIS — A419 Sepsis, unspecified organism: Secondary | ICD-10-CM | POA: Diagnosis not present

## 2021-12-20 LAB — CBC
HCT: 27.3 % — ABNORMAL LOW (ref 39.0–52.0)
Hemoglobin: 7.9 g/dL — ABNORMAL LOW (ref 13.0–17.0)
MCH: 24.7 pg — ABNORMAL LOW (ref 26.0–34.0)
MCHC: 28.9 g/dL — ABNORMAL LOW (ref 30.0–36.0)
MCV: 85.3 fL (ref 80.0–100.0)
Platelets: 133 10*3/uL — ABNORMAL LOW (ref 150–400)
RBC: 3.2 MIL/uL — ABNORMAL LOW (ref 4.22–5.81)
RDW: 15.9 % — ABNORMAL HIGH (ref 11.5–15.5)
WBC: 6.4 10*3/uL (ref 4.0–10.5)
nRBC: 0 % (ref 0.0–0.2)

## 2021-12-20 LAB — BASIC METABOLIC PANEL
Anion gap: 7 (ref 5–15)
BUN: 28 mg/dL — ABNORMAL HIGH (ref 8–23)
CO2: 38 mmol/L — ABNORMAL HIGH (ref 22–32)
Calcium: 8.8 mg/dL — ABNORMAL LOW (ref 8.9–10.3)
Chloride: 93 mmol/L — ABNORMAL LOW (ref 98–111)
Creatinine, Ser: 0.68 mg/dL (ref 0.61–1.24)
GFR, Estimated: >60 mL/min (ref >60)
Glucose, Bld: 124 mg/dL — ABNORMAL HIGH (ref 70–99)
Potassium: 4.2 mmol/L (ref 3.5–5.1)
Sodium: 138 mmol/L (ref 135–145)

## 2021-12-20 LAB — MAGNESIUM: Magnesium: 2.4 mg/dL (ref 1.7–2.4)

## 2021-12-20 NOTE — Progress Notes (Signed)
Pulmonary Critical Care Medicine [place]   PULMONARY CRITICAL CARE SERVICE  PROGRESS NOTE     Isaac [name]  [id number]  DOB: 01/01/1956   DOA: 11/15/2021  Referring Physician: Satira Sark, MD  HPI: Isaac [name] is a 66 y.o. male being followed for ventilator/airway/oxygen weaning Acute on Chronic Respiratory Failure.  Patient has been weaning on T collar supposed be doing 8 hours wean today  Medications: Reviewed on Rounds  Physical Exam:  Vitals: Temperature 98.0 pulse 76 respiratory rate was 19 blood pressure is 125/60 saturations 97%  Ventilator Settings currently on assist control FiO2 40% tidal volume 697 PEEP 5  General: Comfortable at this time Neck: supple Cardiovascular: no malignant arrhythmias Respiratory: No rhonchi no rales are noted at this time Skin: no rash seen on limited exam Musculoskeletal: No gross abnormality Psychiatric:unable to assess Neurologic:no involuntary movements         Lab Data:   Basic Metabolic Panel: Recent Labs  Lab 12/14/21 0323 12/18/21 0432 12/20/21 0553  NA 141 139 138  K 4.4 5.1 4.2  CL 99 96* 93*  CO2 34* 35* 38*  GLUCOSE 127* 104* 124*  BUN 24* 28* 28*  CREATININE 0.56* 0.69 0.68  CALCIUM 8.2* 8.3* 8.8*  MG 2.0 2.4 2.4  PHOS 3.9 4.2  --     ABG: No results for input(s): PHART, PCO2ART, PO2ART, HCO3, O2SAT in the last 168 hours.  Liver Function Tests: Recent Labs  Lab 12/18/21 0432  ALBUMIN 2.3*   No results for input(s): LIPASE, AMYLASE in the last 168 hours. No results for input(s): AMMONIA in the last 168 hours.  CBC: Recent Labs  Lab 12/14/21 0323 12/18/21 0432 12/20/21 0553  WBC 5.9 7.0 6.4  HGB 7.9* 7.6* 7.9*  HCT 26.5* 26.4* 27.3*  MCV 84.1 84.3 85.3  PLT 120* 144* 133*    Cardiac Enzymes: No results for input(s): CKTOTAL, CKMB, CKMBINDEX, TROPONINI in the last 168 hours.  BNP (last 3 results) Recent Labs    03/04/21 1555 08/21/21 0504  BNP  93.2 142.9*    ProBNP (last 3 results) No results for input(s): PROBNP in the last 8760 hours.  Radiological Exams: DG CHEST PORT 1 VIEW  Result Date: 12/20/2021 CLINICAL DATA:  66 year old male with history of pneumonia. EXAM: PORTABLE CHEST 1 VIEW COMPARISON:  Chest x-ray 12/14/2021. FINDINGS: A tracheostomy tube is in place with tip 10.1 cm above the carina. Bibasilar opacities (left-greater-than-right) which may reflect areas of atelectasis and/or consolidation. Small right and small to moderate left pleural effusions. No pneumothorax. No evidence of pulmonary edema. Heart size is mildly enlarged. Upper mediastinal contours are within normal limits. IMPRESSION: 1. Support apparatus, as above. 2. Bibasilar (left-greater-than-right) areas of atelectasis and/or consolidation with small to moderate left and small right pleural effusions. Electronically Signed   By: Vinnie Langton M.D.   On: 12/20/2021 06:12    Assessment/Plan Active Problems:   COPD, severe (HCC)   Acute on chronic respiratory failure with hypoxia (HCC)   HCAP (healthcare-associated pneumonia)   COVID-19   Severe sepsis (HCC)   Acute on chronic respiratory failure with hypoxia plan is to wean on T-piece again today goal is 8 hours Severe COPD medical management we will continue to follow along closely Healthcare associated pneumonia has been treated we will continue to monitor COVID-19 virus infection in recovery Severe sepsis treated in resolution   I have personally seen and evaluated the patient, evaluated laboratory and imaging results, formulated the assessment  and plan and placed orders. The Patient requires high complexity decision making with multiple systems involvement.  Rounds were done with the Respiratory Therapy Director and Staff therapists and discussed with nursing staff also.  Allyne Gee, MD [facility] Pulmonary Critical Care Medicine Sleep Medicine

## 2021-12-21 DIAGNOSIS — R6520 Severe sepsis without septic shock: Secondary | ICD-10-CM | POA: Diagnosis not present

## 2021-12-21 DIAGNOSIS — J9620 Acute and chronic respiratory failure, unspecified whether with hypoxia or hypercapnia: Secondary | ICD-10-CM | POA: Diagnosis not present

## 2021-12-21 DIAGNOSIS — E46 Unspecified protein-calorie malnutrition: Secondary | ICD-10-CM | POA: Diagnosis not present

## 2021-12-21 DIAGNOSIS — J189 Pneumonia, unspecified organism: Secondary | ICD-10-CM | POA: Diagnosis not present

## 2021-12-21 DIAGNOSIS — D638 Anemia in other chronic diseases classified elsewhere: Secondary | ICD-10-CM | POA: Diagnosis not present

## 2021-12-21 DIAGNOSIS — A419 Sepsis, unspecified organism: Secondary | ICD-10-CM | POA: Diagnosis not present

## 2021-12-21 DIAGNOSIS — U071 COVID-19: Secondary | ICD-10-CM | POA: Diagnosis not present

## 2021-12-21 DIAGNOSIS — J449 Chronic obstructive pulmonary disease, unspecified: Secondary | ICD-10-CM | POA: Diagnosis not present

## 2021-12-21 DIAGNOSIS — J9621 Acute and chronic respiratory failure with hypoxia: Secondary | ICD-10-CM | POA: Diagnosis not present

## 2021-12-21 NOTE — Progress Notes (Signed)
Pulmonary Critical Care Medicine [place]   PULMONARY CRITICAL CARE SERVICE  PROGRESS NOTE     Isaac [name]  [id number]  DOB: Jul 24, 1956   DOA: 11/15/2021  Referring Physician: Satira Sark, MD  HPI: Isaac [name] is a 66 y.o. male being followed for ventilator/airway/oxygen weaning Acute on Chronic Respiratory Failure.  Patient is afebrile resting comfortably without distress today hours of T collar yesterday  Medications: Reviewed on Rounds  Physical Exam:  Vitals: Temperature is 97.0 pulse 93 respiratory 20 blood pressure is 120/68 saturations 95%  Ventilator Settings on assist control FiO2 is 40% tidal volume 500 PEEP 5  General: Comfortable at this time Neck: supple Cardiovascular: no malignant arrhythmias Respiratory: No rhonchi very coarse breath sounds Skin: no rash seen on limited exam Musculoskeletal: No gross abnormality Psychiatric:unable to assess Neurologic:no involuntary movements         Lab Data:   Basic Metabolic Panel: Recent Labs  Lab 12/18/21 0432 12/20/21 0553  NA 139 138  K 5.1 4.2  CL 96* 93*  CO2 35* 38*  GLUCOSE 104* 124*  BUN 28* 28*  CREATININE 0.69 0.68  CALCIUM 8.3* 8.8*  MG 2.4 2.4  PHOS 4.2  --     ABG: No results for input(s): PHART, PCO2ART, PO2ART, HCO3, O2SAT in the last 168 hours.  Liver Function Tests: Recent Labs  Lab 12/18/21 0432  ALBUMIN 2.3*   No results for input(s): LIPASE, AMYLASE in the last 168 hours. No results for input(s): AMMONIA in the last 168 hours.  CBC: Recent Labs  Lab 12/18/21 0432 12/20/21 0553  WBC 7.0 6.4  HGB 7.6* 7.9*  HCT 26.4* 27.3*  MCV 84.3 85.3  PLT 144* 133*    Cardiac Enzymes: No results for input(s): CKTOTAL, CKMB, CKMBINDEX, TROPONINI in the last 168 hours.  BNP (last 3 results) Recent Labs    03/04/21 1555 08/21/21 0504  BNP 93.2 142.9*    ProBNP (last 3 results) No results for input(s): PROBNP in the last 8760  hours.  Radiological Exams: DG CHEST PORT 1 VIEW  Result Date: 12/20/2021 CLINICAL DATA:  66 year old male with history of pneumonia. EXAM: PORTABLE CHEST 1 VIEW COMPARISON:  Chest x-ray 12/14/2021. FINDINGS: A tracheostomy tube is in place with tip 10.1 cm above the carina. Bibasilar opacities (left-greater-than-right) which may reflect areas of atelectasis and/or consolidation. Small right and small to moderate left pleural effusions. No pneumothorax. No evidence of pulmonary edema. Heart size is mildly enlarged. Upper mediastinal contours are within normal limits. IMPRESSION: 1. Support apparatus, as above. 2. Bibasilar (left-greater-than-right) areas of atelectasis and/or consolidation with small to moderate left and small right pleural effusions. Electronically Signed   By: Vinnie Langton M.D.   On: 12/20/2021 06:12    Assessment/Plan Active Problems:   COPD, severe (HCC)   Acute on chronic respiratory failure with hypoxia (HCC)   HCAP (healthcare-associated pneumonia)   COVID-19   Severe sepsis (HCC)   Acute on chronic respiratory failure hypoxia patient is on assist control mode we will continue with the assist-control yesterday was able to do T-piece for 8 hours once these medications take effect we will try to wean once again Severe COPD medical management nebulizers as needed Healthcare associated pneumonia showing some bibasilar areas of atelectasis needs aggressive pulmonary toileting Severe sepsis treated resolved hemodynamics are stable COVID-19 virus infection   I have personally seen and evaluated the patient, evaluated laboratory and imaging results, formulated the assessment and plan and placed orders. The  Patient requires high complexity decision making with multiple systems involvement.  Rounds were done with the Respiratory Therapy Director and Staff therapists and discussed with nursing staff also.  Allyne Gee, MD [facility] Pulmonary Critical Care Medicine Sleep  Medicine

## 2021-12-22 DIAGNOSIS — A419 Sepsis, unspecified organism: Secondary | ICD-10-CM | POA: Diagnosis not present

## 2021-12-22 DIAGNOSIS — D638 Anemia in other chronic diseases classified elsewhere: Secondary | ICD-10-CM | POA: Diagnosis not present

## 2021-12-22 DIAGNOSIS — J9621 Acute and chronic respiratory failure with hypoxia: Secondary | ICD-10-CM | POA: Diagnosis not present

## 2021-12-22 DIAGNOSIS — J449 Chronic obstructive pulmonary disease, unspecified: Secondary | ICD-10-CM | POA: Diagnosis not present

## 2021-12-22 DIAGNOSIS — E46 Unspecified protein-calorie malnutrition: Secondary | ICD-10-CM | POA: Diagnosis not present

## 2021-12-22 DIAGNOSIS — J189 Pneumonia, unspecified organism: Secondary | ICD-10-CM | POA: Diagnosis not present

## 2021-12-22 DIAGNOSIS — U071 COVID-19: Secondary | ICD-10-CM | POA: Diagnosis not present

## 2021-12-22 DIAGNOSIS — J9620 Acute and chronic respiratory failure, unspecified whether with hypoxia or hypercapnia: Secondary | ICD-10-CM | POA: Diagnosis not present

## 2021-12-22 DIAGNOSIS — R6520 Severe sepsis without septic shock: Secondary | ICD-10-CM | POA: Diagnosis not present

## 2021-12-22 LAB — BASIC METABOLIC PANEL
Anion gap: 5 (ref 5–15)
BUN: 25 mg/dL — ABNORMAL HIGH (ref 8–23)
CO2: 39 mmol/L — ABNORMAL HIGH (ref 22–32)
Calcium: 8.7 mg/dL — ABNORMAL LOW (ref 8.9–10.3)
Chloride: 95 mmol/L — ABNORMAL LOW (ref 98–111)
Creatinine, Ser: 0.71 mg/dL (ref 0.61–1.24)
GFR, Estimated: >60 mL/min (ref >60)
Glucose, Bld: 116 mg/dL — ABNORMAL HIGH (ref 70–99)
Potassium: 4.7 mmol/L (ref 3.5–5.1)
Sodium: 139 mmol/L (ref 135–145)

## 2021-12-22 LAB — CBC
HCT: 27.7 % — ABNORMAL LOW (ref 39.0–52.0)
Hemoglobin: 7.8 g/dL — ABNORMAL LOW (ref 13.0–17.0)
MCH: 24.1 pg — ABNORMAL LOW (ref 26.0–34.0)
MCHC: 28.2 g/dL — ABNORMAL LOW (ref 30.0–36.0)
MCV: 85.8 fL (ref 80.0–100.0)
Platelets: 137 10*3/uL — ABNORMAL LOW (ref 150–400)
RBC: 3.23 MIL/uL — ABNORMAL LOW (ref 4.22–5.81)
RDW: 16.3 % — ABNORMAL HIGH (ref 11.5–15.5)
WBC: 7 10*3/uL (ref 4.0–10.5)
nRBC: 0 % (ref 0.0–0.2)

## 2021-12-22 LAB — HEPARIN INDUCED PLATELET AB (HIT ANTIBODY): Heparin Induced Plt Ab: 0.14 OD (ref 0.000–0.400)

## 2021-12-22 LAB — MAGNESIUM: Magnesium: 2.7 mg/dL — ABNORMAL HIGH (ref 1.7–2.4)

## 2021-12-22 NOTE — Progress Notes (Signed)
Pulmonary Critical Care Medicine [place]   PULMONARY CRITICAL CARE SERVICE  PROGRESS NOTE     [name]  FBX:[id number]  DOB: 1955-11-27   DOA: 11/15/2021  Referring Physician: Satira Sark, MD  HPI: Isaac [name] is a 66 y.o. male being followed for ventilator/airway/oxygen weaning Acute on Chronic Respiratory Failure.  Comfortable without distress at this time patient is 35% FiO2 weaning for 16 hours  Medications: Reviewed on Rounds  Physical Exam:  Vitals: Temperature 97.3 pulse 89 respiratory rate is 18 blood pressure 98/54 saturations 92%  Ventilator Settings pressure support FiO2 35% pressure 10/5  General: Comfortable at this time Neck: supple Cardiovascular: no malignant arrhythmias Respiratory: No rhonchi very coarse breath sounds Skin: no rash seen on limited exam Musculoskeletal: No gross abnormality Psychiatric:unable to assess Neurologic:no involuntary movements         Lab Data:   Basic Metabolic Panel: Recent Labs  Lab 12/18/21 0432 12/20/21 0553 12/22/21 0406  NA 139 138 139  K 5.1 4.2 4.7  CL 96* 93* 95*  CO2 35* 38* 39*  GLUCOSE 104* 124* 116*  BUN 28* 28* 25*  CREATININE 0.69 0.68 0.71  CALCIUM 8.3* 8.8* 8.7*  MG 2.4 2.4 2.7*  PHOS 4.2  --   --     ABG: No results for input(s): PHART, PCO2ART, PO2ART, HCO3, O2SAT in the last 168 hours.  Liver Function Tests: Recent Labs  Lab 12/18/21 0432  ALBUMIN 2.3*   No results for input(s): LIPASE, AMYLASE in the last 168 hours. No results for input(s): AMMONIA in the last 168 hours.  CBC: Recent Labs  Lab 12/18/21 0432 12/20/21 0553 12/22/21 0406  WBC 7.0 6.4 7.0  HGB 7.6* 7.9* 7.8*  HCT 26.4* 27.3* 27.7*  MCV 84.3 85.3 85.8  PLT 144* 133* 137*    Cardiac Enzymes: No results for input(s): CKTOTAL, CKMB, CKMBINDEX, TROPONINI in the last 168 hours.  BNP (last 3 results) Recent Labs    03/04/21 1555 08/21/21 0504  BNP 93.2 142.9*     ProBNP (last 3 results) No results for input(s): PROBNP in the last 8760 hours.  Radiological Exams: No results found.  Assessment/Plan Active Problems:   COPD, severe (HCC)   Acute on chronic respiratory failure with hypoxia (HCC)   HCAP (healthcare-associated pneumonia)   COVID-19   Severe sepsis (HCC)   Acute on chronic respiratory failure hypoxia pressure support mode patient is tolerating it well.  The goal is for 16 hours Severe COPD medical management continue to monitor closely Healthcare associated pneumonia has been treated COVID-19 virus infection in recovery Severe sepsis treated in resolution   I have personally seen and evaluated the patient, evaluated laboratory and imaging results, formulated the assessment and plan and placed orders. The Patient requires high complexity decision making with multiple systems involvement.  Rounds were done with the Respiratory Therapy Director and Staff therapists and discussed with nursing staff also.  Allyne Gee, MD [facility] Pulmonary Critical Care Medicine Sleep Medicine

## 2021-12-23 DIAGNOSIS — J189 Pneumonia, unspecified organism: Secondary | ICD-10-CM | POA: Diagnosis not present

## 2021-12-23 DIAGNOSIS — A419 Sepsis, unspecified organism: Secondary | ICD-10-CM | POA: Diagnosis not present

## 2021-12-23 DIAGNOSIS — J449 Chronic obstructive pulmonary disease, unspecified: Secondary | ICD-10-CM | POA: Diagnosis not present

## 2021-12-23 DIAGNOSIS — J9621 Acute and chronic respiratory failure with hypoxia: Secondary | ICD-10-CM | POA: Diagnosis not present

## 2021-12-23 DIAGNOSIS — R6520 Severe sepsis without septic shock: Secondary | ICD-10-CM | POA: Diagnosis not present

## 2021-12-23 DIAGNOSIS — U071 COVID-19: Secondary | ICD-10-CM | POA: Diagnosis not present

## 2021-12-23 NOTE — Progress Notes (Signed)
Pulmonary Critical Care Medicine [place]   PULMONARY CRITICAL CARE SERVICE  PROGRESS NOTE     Isaac [name]  KHT:[id number]  DOB: 10-11-1956   DOA: 11/15/2021  Referring Physician: Satira Sark, MD  HPI: Isaac [name] is a 66 y.o. male being followed for ventilator/airway/oxygen weaning Acute on Chronic Respiratory Failure.  Patient is comfortable at this time without distress has been on the T-piece goal of 8 hours  Medications: Reviewed on Rounds  Physical Exam:  Vitals: Temperature is 98.9 pulse 86 respiratory is 19 blood pressure is 129/78 saturations 97%  Ventilator Settings on T-piece on T-piece for 8 hours  General: Comfortable at this time Neck: supple Cardiovascular: no malignant arrhythmias Respiratory: No rhonchi no rales noted at this time Skin: no rash seen on limited exam Musculoskeletal: No gross abnormality Psychiatric:unable to assess Neurologic:no involuntary movements         Lab Data:   Basic Metabolic Panel: Recent Labs  Lab 12/18/21 0432 12/20/21 0553 12/22/21 0406  NA 139 138 139  K 5.1 4.2 4.7  CL 96* 93* 95*  CO2 35* 38* 39*  GLUCOSE 104* 124* 116*  BUN 28* 28* 25*  CREATININE 0.69 0.68 0.71  CALCIUM 8.3* 8.8* 8.7*  MG 2.4 2.4 2.7*  PHOS 4.2  --   --     ABG: No results for input(s): PHART, PCO2ART, PO2ART, HCO3, O2SAT in the last 168 hours.  Liver Function Tests: Recent Labs  Lab 12/18/21 0432  ALBUMIN 2.3*   No results for input(s): LIPASE, AMYLASE in the last 168 hours. No results for input(s): AMMONIA in the last 168 hours.  CBC: Recent Labs  Lab 12/18/21 0432 12/20/21 0553 12/22/21 0406  WBC 7.0 6.4 7.0  HGB 7.6* 7.9* 7.8*  HCT 26.4* 27.3* 27.7*  MCV 84.3 85.3 85.8  PLT 144* 133* 137*    Cardiac Enzymes: No results for input(s): CKTOTAL, CKMB, CKMBINDEX, TROPONINI in the last 168 hours.  BNP (last 3 results) Recent Labs    03/04/21 1555 08/21/21 0504  BNP 93.2 142.9*     ProBNP (last 3 results) No results for input(s): PROBNP in the last 8760 hours.  Radiological Exams: No results found.  Assessment/Plan Active Problems:   COPD, severe (HCC)   Acute on chronic respiratory failure with hypoxia (HCC)   HCAP (healthcare-associated pneumonia)   COVID-19   Severe sepsis (HCC)   Acute on chronic respiratory failure hypoxia continue with the weaning on T-piece titrate oxygen as tolerated continue pulmonary toilet. Severe COPD medical management we will continue to follow along Healthcare associated pneumonia has been treated improving slowly COVID-19 virus infection in recovery Severe sepsis treated resolved   I have personally seen and evaluated the patient, evaluated laboratory and imaging results, formulated the assessment and plan and placed orders. The Patient requires high complexity decision making with multiple systems involvement.  Rounds were done with the Respiratory Therapy Director and Staff therapists and discussed with nursing staff also.  Allyne Gee, MD [facility] Pulmonary Critical Care Medicine Sleep Medicine

## 2021-12-24 LAB — COMPREHENSIVE METABOLIC PANEL
ALT: 35 U/L (ref 0–44)
AST: 31 U/L (ref 15–41)
Albumin: 2.7 g/dL — ABNORMAL LOW (ref 3.5–5.0)
Alkaline Phosphatase: 94 U/L (ref 38–126)
Anion gap: 9 (ref 5–15)
BUN: 31 mg/dL — ABNORMAL HIGH (ref 8–23)
CO2: 35 mmol/L — ABNORMAL HIGH (ref 22–32)
Calcium: 9 mg/dL (ref 8.9–10.3)
Chloride: 95 mmol/L — ABNORMAL LOW (ref 98–111)
Creatinine, Ser: 0.68 mg/dL (ref 0.61–1.24)
GFR, Estimated: >60 mL/min (ref >60)
Glucose, Bld: 170 mg/dL — ABNORMAL HIGH (ref 70–99)
Potassium: 4.5 mmol/L (ref 3.5–5.1)
Sodium: 139 mmol/L (ref 135–145)
Total Bilirubin: 0.5 mg/dL (ref 0.3–1.2)
Total Protein: 5.9 g/dL — ABNORMAL LOW (ref 6.5–8.1)

## 2021-12-24 LAB — URINALYSIS, ROUTINE W REFLEX MICROSCOPIC
Bilirubin Urine: NEGATIVE
Glucose, UA: NEGATIVE mg/dL
Hgb urine dipstick: NEGATIVE
Ketones, ur: NEGATIVE mg/dL
Leukocytes,Ua: NEGATIVE
Nitrite: NEGATIVE
Protein, ur: NEGATIVE mg/dL
Specific Gravity, Urine: 1.008 (ref 1.005–1.030)
pH: 5 (ref 5.0–8.0)

## 2021-12-24 LAB — CBC
HCT: 30.3 % — ABNORMAL LOW (ref 39.0–52.0)
Hemoglobin: 8.8 g/dL — ABNORMAL LOW (ref 13.0–17.0)
MCH: 24.9 pg — ABNORMAL LOW (ref 26.0–34.0)
MCHC: 29 g/dL — ABNORMAL LOW (ref 30.0–36.0)
MCV: 85.8 fL (ref 80.0–100.0)
Platelets: 132 10*3/uL — ABNORMAL LOW (ref 150–400)
RBC: 3.53 MIL/uL — ABNORMAL LOW (ref 4.22–5.81)
RDW: 16.5 % — ABNORMAL HIGH (ref 11.5–15.5)
WBC: 6.5 10*3/uL (ref 4.0–10.5)
nRBC: 0 % (ref 0.0–0.2)

## 2021-12-25 DIAGNOSIS — U071 COVID-19: Secondary | ICD-10-CM | POA: Diagnosis not present

## 2021-12-25 DIAGNOSIS — J189 Pneumonia, unspecified organism: Secondary | ICD-10-CM | POA: Diagnosis not present

## 2021-12-25 DIAGNOSIS — E43 Unspecified severe protein-calorie malnutrition: Secondary | ICD-10-CM | POA: Diagnosis not present

## 2021-12-25 DIAGNOSIS — J9620 Acute and chronic respiratory failure, unspecified whether with hypoxia or hypercapnia: Secondary | ICD-10-CM | POA: Diagnosis not present

## 2021-12-25 DIAGNOSIS — J9621 Acute and chronic respiratory failure with hypoxia: Secondary | ICD-10-CM | POA: Diagnosis not present

## 2021-12-25 DIAGNOSIS — J449 Chronic obstructive pulmonary disease, unspecified: Secondary | ICD-10-CM | POA: Diagnosis not present

## 2021-12-25 DIAGNOSIS — A419 Sepsis, unspecified organism: Secondary | ICD-10-CM | POA: Diagnosis not present

## 2021-12-25 DIAGNOSIS — R6520 Severe sepsis without septic shock: Secondary | ICD-10-CM | POA: Diagnosis not present

## 2021-12-25 DIAGNOSIS — D638 Anemia in other chronic diseases classified elsewhere: Secondary | ICD-10-CM | POA: Diagnosis not present

## 2021-12-25 LAB — CBC
HCT: 29.5 % — ABNORMAL LOW (ref 39.0–52.0)
Hemoglobin: 8.6 g/dL — ABNORMAL LOW (ref 13.0–17.0)
MCH: 25.1 pg — ABNORMAL LOW (ref 26.0–34.0)
MCHC: 29.2 g/dL — ABNORMAL LOW (ref 30.0–36.0)
MCV: 86 fL (ref 80.0–100.0)
Platelets: 152 10*3/uL (ref 150–400)
RBC: 3.43 MIL/uL — ABNORMAL LOW (ref 4.22–5.81)
RDW: 16.5 % — ABNORMAL HIGH (ref 11.5–15.5)
WBC: 5.7 10*3/uL (ref 4.0–10.5)
nRBC: 0 % (ref 0.0–0.2)

## 2021-12-25 NOTE — Progress Notes (Signed)
Pulmonary Critical Care Medicine [place]   PULMONARY CRITICAL CARE SERVICE  PROGRESS NOTE     Isaac [name]  [id number]  DOB: [birth year]   DOA: 11/15/2021  Referring Physician: Satira Sark, MD  HPI: Isaac [name] is a 66 y.o. male being followed for ventilator/airway/oxygen weaning Acute on Chronic Respiratory Failure.  Patient is on the ventilator and full support assist control mode  Medications: Reviewed on Rounds  Physical Exam:  Vitals: Temperature is 98.7 pulse 83 respiratory 20 blood pressure is 101/60 saturations 98%  Ventilator Settings on assist control FiO2 is 35% tidal volume 580 PEEP 5  General: Comfortable at this time Neck: supple Cardiovascular: no malignant arrhythmias Respiratory: No rhonchi no rales are noted at this time Skin: no rash seen on limited exam Musculoskeletal: No gross abnormality Psychiatric:unable to assess Neurologic:no involuntary movements         Lab Data:   Basic Metabolic Panel: Recent Labs  Lab 12/20/21 0553 12/22/21 0406 12/24/21 0453  NA 138 139 139  K 4.2 4.7 4.5  CL 93* 95* 95*  CO2 38* 39* 35*  GLUCOSE 124* 116* 170*  BUN 28* 25* 31*  CREATININE 0.68 0.71 0.68  CALCIUM 8.8* 8.7* 9.0  MG 2.4 2.7*  --     ABG: No results for input(s): PHART, PCO2ART, PO2ART, HCO3, O2SAT in the last 168 hours.  Liver Function Tests: Recent Labs  Lab 12/24/21 0453  AST 31  ALT 35  ALKPHOS 94  BILITOT 0.5  PROT 5.9*  ALBUMIN 2.7*   No results for input(s): LIPASE, AMYLASE in the last 168 hours. No results for input(s): AMMONIA in the last 168 hours.  CBC: Recent Labs  Lab 12/20/21 0553 12/22/21 0406 12/24/21 0453 12/25/21 0706  WBC 6.4 7.0 6.5 5.7  HGB 7.9* 7.8* 8.8* 8.6*  HCT 27.3* 27.7* 30.3* 29.5*  MCV 85.3 85.8 85.8 86.0  PLT 133* 137* 132* 152    Cardiac Enzymes: No results for input(s): CKTOTAL, CKMB, CKMBINDEX, TROPONINI in the last 168 hours.  BNP (last 3  results) Recent Labs    03/04/21 1555 08/21/21 0504  BNP 93.2 142.9*    ProBNP (last 3 results) No results for input(s): PROBNP in the last 8760 hours.  Radiological Exams: No results found.  Assessment/Plan Active Problems:   COPD, severe (HCC)   Acute on chronic respiratory failure with hypoxia (HCC)   HCAP (healthcare-associated pneumonia)   COVID-19   Severe sepsis (HCC)   Acute on chronic respiratory failure hypoxia plan is to try weaning once again patient was resting currently but still gets periodically confused Severe COPD medical management continue to monitor Healthcare associated pneumonia has been treated with antibiotics we will continue to follow along COVID-19 in recovery Severe sepsis resolved hemodynamics are stable   I have personally seen and evaluated the patient, evaluated laboratory and imaging results, formulated the assessment and plan and placed orders. The Patient requires high complexity decision making with multiple systems involvement.  Rounds were done with the Respiratory Therapy Director and Staff therapists and discussed with nursing staff also.  Allyne Gee, MD [facility] Pulmonary Critical Care Medicine Sleep Medicine

## 2021-12-26 DIAGNOSIS — U071 COVID-19: Secondary | ICD-10-CM | POA: Diagnosis not present

## 2021-12-26 DIAGNOSIS — E43 Unspecified severe protein-calorie malnutrition: Secondary | ICD-10-CM | POA: Diagnosis not present

## 2021-12-26 DIAGNOSIS — J449 Chronic obstructive pulmonary disease, unspecified: Secondary | ICD-10-CM | POA: Diagnosis not present

## 2021-12-26 DIAGNOSIS — D638 Anemia in other chronic diseases classified elsewhere: Secondary | ICD-10-CM | POA: Diagnosis not present

## 2021-12-26 DIAGNOSIS — R6520 Severe sepsis without septic shock: Secondary | ICD-10-CM | POA: Diagnosis not present

## 2021-12-26 DIAGNOSIS — A419 Sepsis, unspecified organism: Secondary | ICD-10-CM | POA: Diagnosis not present

## 2021-12-26 DIAGNOSIS — J9620 Acute and chronic respiratory failure, unspecified whether with hypoxia or hypercapnia: Secondary | ICD-10-CM | POA: Diagnosis not present

## 2021-12-26 DIAGNOSIS — J189 Pneumonia, unspecified organism: Secondary | ICD-10-CM | POA: Diagnosis not present

## 2021-12-26 DIAGNOSIS — J9621 Acute and chronic respiratory failure with hypoxia: Secondary | ICD-10-CM | POA: Diagnosis not present

## 2021-12-26 LAB — CBC
HCT: 29.7 % — ABNORMAL LOW (ref 39.0–52.0)
Hemoglobin: 8.7 g/dL — ABNORMAL LOW (ref 13.0–17.0)
MCH: 25.3 pg — ABNORMAL LOW (ref 26.0–34.0)
MCHC: 29.3 g/dL — ABNORMAL LOW (ref 30.0–36.0)
MCV: 86.3 fL (ref 80.0–100.0)
Platelets: 153 10*3/uL (ref 150–400)
RBC: 3.44 MIL/uL — ABNORMAL LOW (ref 4.22–5.81)
RDW: 16.6 % — ABNORMAL HIGH (ref 11.5–15.5)
WBC: 7.1 10*3/uL (ref 4.0–10.5)
nRBC: 0 % (ref 0.0–0.2)

## 2021-12-26 LAB — BASIC METABOLIC PANEL
Anion gap: 7 (ref 5–15)
BUN: 36 mg/dL — ABNORMAL HIGH (ref 8–23)
CO2: 36 mmol/L — ABNORMAL HIGH (ref 22–32)
Calcium: 9.1 mg/dL (ref 8.9–10.3)
Chloride: 100 mmol/L (ref 98–111)
Creatinine, Ser: 0.65 mg/dL (ref 0.61–1.24)
GFR, Estimated: >60 mL/min (ref >60)
Glucose, Bld: 141 mg/dL — ABNORMAL HIGH (ref 70–99)
Potassium: 4.3 mmol/L (ref 3.5–5.1)
Sodium: 143 mmol/L (ref 135–145)

## 2021-12-26 LAB — MAGNESIUM: Magnesium: 2.2 mg/dL (ref 1.7–2.4)

## 2021-12-26 LAB — PHOSPHORUS: Phosphorus: 3.7 mg/dL (ref 2.5–4.6)

## 2021-12-26 NOTE — Progress Notes (Signed)
Pulmonary Critical Care Medicine [place]   PULMONARY CRITICAL CARE SERVICE  PROGRESS NOTE     Isaac [name]  [id number]  DOB: 28-Jun-1956   DOA: 11/15/2021  Referring Physician: Satira Sark, MD  HPI: Isaac [name] is a 66 y.o. male being followed for ventilator/airway/oxygen weaning Acute on Chronic Respiratory Failure.  Patient is afebrile resting comfortably without distress at this time  Medications: Reviewed on Rounds  Physical Exam:  Vitals: Temperature is 97.7 pulse 65 respiratory is 18 blood pressure is 144/83 saturations 97%  Ventilator Settings on T collar FiO2 40%  General: Comfortable at this time Neck: supple Cardiovascular: no malignant arrhythmias Respiratory: No rhonchi no rales are noted Skin: no rash seen on limited exam Musculoskeletal: No gross abnormality Psychiatric:unable to assess Neurologic:no involuntary movements         Lab Data:   Basic Metabolic Panel: Recent Labs  Lab 12/20/21 0553 12/22/21 0406 12/24/21 0453 12/26/21 0412  NA 138 139 139 143  K 4.2 4.7 4.5 4.3  CL 93* 95* 95* 100  CO2 38* 39* 35* 36*  GLUCOSE 124* 116* 170* 141*  BUN 28* 25* 31* 36*  CREATININE 0.68 0.71 0.68 0.65  CALCIUM 8.8* 8.7* 9.0 9.1  MG 2.4 2.7*  --  2.2  PHOS  --   --   --  3.7    ABG: No results for input(s): PHART, PCO2ART, PO2ART, HCO3, O2SAT in the last 168 hours.  Liver Function Tests: Recent Labs  Lab 12/24/21 0453  AST 31  ALT 35  ALKPHOS 94  BILITOT 0.5  PROT 5.9*  ALBUMIN 2.7*   No results for input(s): LIPASE, AMYLASE in the last 168 hours. No results for input(s): AMMONIA in the last 168 hours.  CBC: Recent Labs  Lab 12/20/21 0553 12/22/21 0406 12/24/21 0453 12/25/21 0706 12/26/21 0412  WBC 6.4 7.0 6.5 5.7 7.1  HGB 7.9* 7.8* 8.8* 8.6* 8.7*  HCT 27.3* 27.7* 30.3* 29.5* 29.7*  MCV 85.3 85.8 85.8 86.0 86.3  PLT 133* 137* 132* 152 153    Cardiac Enzymes: No results for input(s):  CKTOTAL, CKMB, CKMBINDEX, TROPONINI in the last 168 hours.  BNP (last 3 results) Recent Labs    03/04/21 1555 08/21/21 0504  BNP 93.2 142.9*    ProBNP (last 3 results) No results for input(s): PROBNP in the last 8760 hours.  Radiological Exams: No results found.  Assessment/Plan Active Problems:   COPD, severe (HCC)   Acute on chronic respiratory failure with hypoxia (HCC)   HCAP (healthcare-associated pneumonia)   COVID-19   Severe sepsis (HCC)   Acute on chronic respiratory failure hypoxia seems to be doing a little bit better as long as his anxiety is under control he did 12 hours of T collar yesterday plan is to do 16 hours today Severe COPD medical management we will continue to follow along Healthcare associated pneumonia has been treated with antibiotics COVID-19 virus infection in recovery Severe sepsis treated in resolution we will continue to follow along closely   I have personally seen and evaluated the patient, evaluated laboratory and imaging results, formulated the assessment and plan and placed orders. The Patient requires high complexity decision making with multiple systems involvement.  Rounds were done with the Respiratory Therapy Director and Staff therapists and discussed with nursing staff also.  Allyne Gee, MD [facility] Pulmonary Critical Care Medicine Sleep Medicine

## 2021-12-27 DIAGNOSIS — J9620 Acute and chronic respiratory failure, unspecified whether with hypoxia or hypercapnia: Secondary | ICD-10-CM | POA: Diagnosis not present

## 2021-12-27 DIAGNOSIS — U071 COVID-19: Secondary | ICD-10-CM | POA: Diagnosis not present

## 2021-12-27 DIAGNOSIS — E43 Unspecified severe protein-calorie malnutrition: Secondary | ICD-10-CM | POA: Diagnosis not present

## 2021-12-27 DIAGNOSIS — J189 Pneumonia, unspecified organism: Secondary | ICD-10-CM | POA: Diagnosis not present

## 2021-12-27 DIAGNOSIS — D638 Anemia in other chronic diseases classified elsewhere: Secondary | ICD-10-CM | POA: Diagnosis not present

## 2021-12-27 DIAGNOSIS — R6520 Severe sepsis without septic shock: Secondary | ICD-10-CM | POA: Diagnosis not present

## 2021-12-27 DIAGNOSIS — J9621 Acute and chronic respiratory failure with hypoxia: Secondary | ICD-10-CM | POA: Diagnosis not present

## 2021-12-27 DIAGNOSIS — J449 Chronic obstructive pulmonary disease, unspecified: Secondary | ICD-10-CM | POA: Diagnosis not present

## 2021-12-27 DIAGNOSIS — A419 Sepsis, unspecified organism: Secondary | ICD-10-CM | POA: Diagnosis not present

## 2021-12-27 LAB — URINE CULTURE: Culture: >=100000 — AB

## 2021-12-27 LAB — CBC
HCT: 29.6 % — ABNORMAL LOW (ref 39.0–52.0)
Hemoglobin: 8.4 g/dL — ABNORMAL LOW (ref 13.0–17.0)
MCH: 24.5 pg — ABNORMAL LOW (ref 26.0–34.0)
MCHC: 28.4 g/dL — ABNORMAL LOW (ref 30.0–36.0)
MCV: 86.3 fL (ref 80.0–100.0)
Platelets: 152 10*3/uL (ref 150–400)
RBC: 3.43 MIL/uL — ABNORMAL LOW (ref 4.22–5.81)
RDW: 16.7 % — ABNORMAL HIGH (ref 11.5–15.5)
WBC: 7.5 10*3/uL (ref 4.0–10.5)
nRBC: 0 % (ref 0.0–0.2)

## 2021-12-27 NOTE — Progress Notes (Signed)
Pulmonary Critical Care Medicine [place]   PULMONARY CRITICAL CARE SERVICE  PROGRESS NOTE     Isaac [name]  YPP:[id number]  DOB: 11/11/1956   DOA: 11/15/2021  Referring Physician: Satira Sark, MD  HPI: Isaac [name] is a 66 y.o. male being followed for ventilator/airway/oxygen weaning Acute on Chronic Respiratory Failure.  Patient apparently was agitated overnight pulled out his trach it was reinserted without difficulty and currently is back on pressure support  Medications: Reviewed on Rounds  Physical Exam:  Vitals: Temperature is 98.1 pulse 92 is 226 blood pressure is 109/74 saturations 98%  Ventilator Settings patient is on pressure support FiO2 35% pressure 12/5  General: Comfortable at this time Neck: supple Cardiovascular: no malignant arrhythmias Respiratory: Scattered rhonchi very coarse breath sounds Skin: no rash seen on limited exam Musculoskeletal: No gross abnormality Psychiatric:unable to assess Neurologic:no involuntary movements         Lab Data:   Basic Metabolic Panel: Recent Labs  Lab 12/22/21 0406 12/24/21 0453 12/26/21 0412  NA 139 139 143  K 4.7 4.5 4.3  CL 95* 95* 100  CO2 39* 35* 36*  GLUCOSE 116* 170* 141*  BUN 25* 31* 36*  CREATININE 0.71 0.68 0.65  CALCIUM 8.7* 9.0 9.1  MG 2.7*  --  2.2  PHOS  --   --  3.7    ABG: No results for input(s): PHART, PCO2ART, PO2ART, HCO3, O2SAT in the last 168 hours.  Liver Function Tests: Recent Labs  Lab 12/24/21 0453  AST 31  ALT 35  ALKPHOS 94  BILITOT 0.5  PROT 5.9*  ALBUMIN 2.7*   No results for input(s): LIPASE, AMYLASE in the last 168 hours. No results for input(s): AMMONIA in the last 168 hours.  CBC: Recent Labs  Lab 12/22/21 0406 12/24/21 0453 12/25/21 0706 12/26/21 0412 12/27/21 0716  WBC 7.0 6.5 5.7 7.1 7.5  HGB 7.8* 8.8* 8.6* 8.7* 8.4*  HCT 27.7* 30.3* 29.5* 29.7* 29.6*  MCV 85.8 85.8 86.0 86.3 86.3  PLT 137* 132* 152 153 152     Cardiac Enzymes: No results for input(s): CKTOTAL, CKMB, CKMBINDEX, TROPONINI in the last 168 hours.  BNP (last 3 results) Recent Labs    03/04/21 1555 08/21/21 0504  BNP 93.2 142.9*    ProBNP (last 3 results) No results for input(s): PROBNP in the last 8760 hours.  Radiological Exams: No results found.  Assessment/Plan Active Problems:   COPD, severe (HCC)   Acute on chronic respiratory failure with hypoxia (HCC)   HCAP (healthcare-associated pneumonia)   COVID-19   Severe sepsis (HCC)   Acute on chronic respiratory failure hypoxia plan is going to be to try to wean once again on the T collar patient was able to complete 16 hours yesterday we will continue to advance Severe COPD medical management continue with supportive care Health care associated pneumonia has been treated COVID-19 virus infection supportive care Severe sepsis resolved   I have personally seen and evaluated the patient, evaluated laboratory and imaging results, formulated the assessment and plan and placed orders. The Patient requires high complexity decision making with multiple systems involvement.  Rounds were done with the Respiratory Therapy Director and Staff therapists and discussed with nursing staff also.  Allyne Gee, MD [facility] Pulmonary Critical Care Medicine Sleep Medicine

## 2021-12-28 ENCOUNTER — Telehealth: Payer: Self-pay | Admitting: Internal Medicine

## 2021-12-28 DIAGNOSIS — J189 Pneumonia, unspecified organism: Secondary | ICD-10-CM | POA: Diagnosis not present

## 2021-12-28 DIAGNOSIS — D696 Thrombocytopenia, unspecified: Secondary | ICD-10-CM | POA: Diagnosis not present

## 2021-12-28 DIAGNOSIS — J9621 Acute and chronic respiratory failure with hypoxia: Secondary | ICD-10-CM | POA: Diagnosis not present

## 2021-12-28 DIAGNOSIS — R6520 Severe sepsis without septic shock: Secondary | ICD-10-CM | POA: Diagnosis not present

## 2021-12-28 DIAGNOSIS — A419 Sepsis, unspecified organism: Secondary | ICD-10-CM | POA: Diagnosis not present

## 2021-12-28 DIAGNOSIS — E43 Unspecified severe protein-calorie malnutrition: Secondary | ICD-10-CM | POA: Diagnosis not present

## 2021-12-28 DIAGNOSIS — U071 COVID-19: Secondary | ICD-10-CM | POA: Diagnosis not present

## 2021-12-28 DIAGNOSIS — J9620 Acute and chronic respiratory failure, unspecified whether with hypoxia or hypercapnia: Secondary | ICD-10-CM | POA: Diagnosis not present

## 2021-12-28 DIAGNOSIS — J441 Chronic obstructive pulmonary disease with (acute) exacerbation: Secondary | ICD-10-CM

## 2021-12-28 DIAGNOSIS — J449 Chronic obstructive pulmonary disease, unspecified: Secondary | ICD-10-CM | POA: Diagnosis not present

## 2021-12-28 NOTE — Progress Notes (Signed)
Pulmonary Critical Care Medicine [place]   PULMONARY CRITICAL CARE SERVICE  PROGRESS NOTE     ANTHONYJAMES BARGAR  QMV:[id number]  DOB: July 16, 1956   DOA: 11/15/2021  Referring Physician: Satira Sark, MD  HPI: BURNS TIMSON is a 66 y.o. male being followed for ventilator/airway/oxygen weaning Acute on Chronic Respiratory Failure.  Patient is on assist control mode has been resting comfortably without distress has been on 35% FiO2  Medications: Reviewed on Rounds  Physical Exam:  Vitals: Temperature is 98.6 pulse 95 respiratory 18 blood pressure 142/92 saturations 99%  Ventilator Settings assist control FiO2 35% tidal volume 599 PEEP 5  General: Comfortable at this time Neck: supple Cardiovascular: no malignant arrhythmias Respiratory: Scattered rhonchi coarse breath sound Skin: no rash seen on limited exam Musculoskeletal: No gross abnormality Psychiatric:unable to assess Neurologic:no involuntary movements         Lab Data:   Basic Metabolic Panel: Recent Labs  Lab 12/22/21 0406 12/24/21 0453 12/26/21 0412  NA 139 139 143  K 4.7 4.5 4.3  CL 95* 95* 100  CO2 39* 35* 36*  GLUCOSE 116* 170* 141*  BUN 25* 31* 36*  CREATININE 0.71 0.68 0.65  CALCIUM 8.7* 9.0 9.1  MG 2.7*  --  2.2  PHOS  --   --  3.7    ABG: No results for input(s): PHART, PCO2ART, PO2ART, HCO3, O2SAT in the last 168 hours.  Liver Function Tests: Recent Labs  Lab 12/24/21 0453  AST 31  ALT 35  ALKPHOS 94  BILITOT 0.5  PROT 5.9*  ALBUMIN 2.7*   No results for input(s): LIPASE, AMYLASE in the last 168 hours. No results for input(s): AMMONIA in the last 168 hours.  CBC: Recent Labs  Lab 12/22/21 0406 12/24/21 0453 12/25/21 0706 12/26/21 0412 12/27/21 0716  WBC 7.0 6.5 5.7 7.1 7.5  HGB 7.8* 8.8* 8.6* 8.7* 8.4*  HCT 27.7* 30.3* 29.5* 29.7* 29.6*  MCV 85.8 85.8 86.0 86.3 86.3  PLT 137* 132* 152 153 152    Cardiac Enzymes: No results for input(s):  CKTOTAL, CKMB, CKMBINDEX, TROPONINI in the last 168 hours.  BNP (last 3 results) Recent Labs    03/04/21 1555 08/21/21 0504  BNP 93.2 142.9*    ProBNP (last 3 results) No results for input(s): PROBNP in the last 8760 hours.  Radiological Exams: No results found.  Assessment/Plan Active Problems:   COPD, severe (HCC)   Acute on chronic respiratory failure with hypoxia (HCC)   HCAP (healthcare-associated pneumonia)   COVID-19   Severe sepsis (HCC)   Acute on chronic respiratory failure hypoxia plan is to continue with the ventilator support patient is intermittently tolerating weaning has been more confused and agitated also a lot of anxiety issues.  Severe COPD medical management will continue to follow along closely. Healthcare associated pneumonia has been treated with antibiotics COVID-19 virus infection recovery Severe sepsis resolved hemodynamics are stable   I have personally seen and evaluated the patient, evaluated laboratory and imaging results, formulated the assessment and plan and placed orders. The Patient requires high complexity decision making with multiple systems involvement.  Rounds were done with the Respiratory Therapy Director and Staff therapists and discussed with nursing staff also.  Allyne Gee, MD [facility] Pulmonary Critical Care Medicine Sleep Medicine

## 2021-12-29 DIAGNOSIS — J449 Chronic obstructive pulmonary disease, unspecified: Secondary | ICD-10-CM | POA: Diagnosis not present

## 2021-12-29 DIAGNOSIS — J189 Pneumonia, unspecified organism: Secondary | ICD-10-CM | POA: Diagnosis not present

## 2021-12-29 DIAGNOSIS — E43 Unspecified severe protein-calorie malnutrition: Secondary | ICD-10-CM | POA: Diagnosis not present

## 2021-12-29 DIAGNOSIS — J9621 Acute and chronic respiratory failure with hypoxia: Secondary | ICD-10-CM | POA: Diagnosis not present

## 2021-12-29 DIAGNOSIS — J9620 Acute and chronic respiratory failure, unspecified whether with hypoxia or hypercapnia: Secondary | ICD-10-CM | POA: Diagnosis not present

## 2021-12-29 DIAGNOSIS — U071 COVID-19: Secondary | ICD-10-CM | POA: Diagnosis not present

## 2021-12-29 DIAGNOSIS — D638 Anemia in other chronic diseases classified elsewhere: Secondary | ICD-10-CM | POA: Diagnosis not present

## 2021-12-29 DIAGNOSIS — R6520 Severe sepsis without septic shock: Secondary | ICD-10-CM | POA: Diagnosis not present

## 2021-12-29 DIAGNOSIS — A419 Sepsis, unspecified organism: Secondary | ICD-10-CM | POA: Diagnosis not present

## 2021-12-29 LAB — BASIC METABOLIC PANEL
Anion gap: 7 (ref 5–15)
BUN: 32 mg/dL — ABNORMAL HIGH (ref 8–23)
CO2: 35 mmol/L — ABNORMAL HIGH (ref 22–32)
Calcium: 8.8 mg/dL — ABNORMAL LOW (ref 8.9–10.3)
Chloride: 96 mmol/L — ABNORMAL LOW (ref 98–111)
Creatinine, Ser: 0.7 mg/dL (ref 0.61–1.24)
GFR, Estimated: >60 mL/min (ref >60)
Glucose, Bld: 134 mg/dL — ABNORMAL HIGH (ref 70–99)
Potassium: 4.2 mmol/L (ref 3.5–5.1)
Sodium: 138 mmol/L (ref 135–145)

## 2021-12-29 LAB — CBC
HCT: 28.1 % — ABNORMAL LOW (ref 39.0–52.0)
Hemoglobin: 8.3 g/dL — ABNORMAL LOW (ref 13.0–17.0)
MCH: 24.9 pg — ABNORMAL LOW (ref 26.0–34.0)
MCHC: 29.5 g/dL — ABNORMAL LOW (ref 30.0–36.0)
MCV: 84.4 fL (ref 80.0–100.0)
Platelets: 143 10*3/uL — ABNORMAL LOW (ref 150–400)
RBC: 3.33 MIL/uL — ABNORMAL LOW (ref 4.22–5.81)
RDW: 16.2 % — ABNORMAL HIGH (ref 11.5–15.5)
WBC: 5.9 10*3/uL (ref 4.0–10.5)
nRBC: 0 % (ref 0.0–0.2)

## 2021-12-29 LAB — MAGNESIUM: Magnesium: 2.2 mg/dL (ref 1.7–2.4)

## 2021-12-29 LAB — PHOSPHORUS: Phosphorus: 4.8 mg/dL — ABNORMAL HIGH (ref 2.5–4.6)

## 2021-12-29 NOTE — Progress Notes (Signed)
Pulmonary Critical Care Medicine [place]   PULMONARY CRITICAL CARE SERVICE  PROGRESS NOTE     Isaac [name]  PPI:[id number]  DOB: 06-06-1956   DOA: 11/15/2021  Referring Physician: Satira Sark, MD  HPI: Isaac [name] is a 66 y.o. male being followed for ventilator/airway/oxygen weaning Acute on Chronic Respiratory Failure.  Patient is on full support supposed to be doing T collar wean today  Medications: Reviewed on Rounds  Physical Exam:  Vitals: Temperature is 97.6 pulse 93 respiratory 26 blood pressure is 124/75 saturations 96%  Ventilator Settings on assist control FiO2 35% tidal volume 580 PEEP 5  General: Comfortable at this time Neck: supple Cardiovascular: no malignant arrhythmias Respiratory: Scattered rhonchi expansion is equal Skin: no rash seen on limited exam Musculoskeletal: No gross abnormality Psychiatric:unable to assess Neurologic:no involuntary movements         Lab Data:   Basic Metabolic Panel: Recent Labs  Lab 12/24/21 0453 12/26/21 0412 12/29/21 0348  NA 139 143 138  K 4.5 4.3 4.2  CL 95* 100 96*  CO2 35* 36* 35*  GLUCOSE 170* 141* 134*  BUN 31* 36* 32*  CREATININE 0.68 0.65 0.70  CALCIUM 9.0 9.1 8.8*  MG  --  2.2 2.2  PHOS  --  3.7 4.8*    ABG: No results for input(s): PHART, PCO2ART, PO2ART, HCO3, O2SAT in the last 168 hours.  Liver Function Tests: Recent Labs  Lab 12/24/21 0453  AST 31  ALT 35  ALKPHOS 94  BILITOT 0.5  PROT 5.9*  ALBUMIN 2.7*   No results for input(s): LIPASE, AMYLASE in the last 168 hours. No results for input(s): AMMONIA in the last 168 hours.  CBC: Recent Labs  Lab 12/24/21 0453 12/25/21 0706 12/26/21 0412 12/27/21 0716 12/29/21 0348  WBC 6.5 5.7 7.1 7.5 5.9  HGB 8.8* 8.6* 8.7* 8.4* 8.3*  HCT 30.3* 29.5* 29.7* 29.6* 28.1*  MCV 85.8 86.0 86.3 86.3 84.4  PLT 132* 152 153 152 143*    Cardiac Enzymes: No results for input(s): CKTOTAL, CKMB, CKMBINDEX,  TROPONINI in the last 168 hours.  BNP (last 3 results) Recent Labs    03/04/21 1555 08/21/21 0504  BNP 93.2 142.9*    ProBNP (last 3 results) No results for input(s): PROBNP in the last 8760 hours.  Radiological Exams: No results found.  Assessment/Plan Active Problems:   COPD, severe (HCC)   Acute on chronic respiratory failure with hypoxia (HCC)   HCAP (healthcare-associated pneumonia)   COVID-19   Severe sepsis (HCC)   Acute on chronic respiratory failure hypoxia plan is to continue to wean on T-piece titrate oxygen continue secretion management supportive care.  Patient has been intermittently tolerating T-piece he gets quite agitated and thrashes about and has actually pulled out his trach earlier this week Severe COPD medical management Healthcare associated pneumonia has been treated slow to improve COVID-19 virus infection in recovery phase Severe sepsis resolved   I have personally seen and evaluated the patient, evaluated laboratory and imaging results, formulated the assessment and plan and placed orders. The Patient requires high complexity decision making with multiple systems involvement.  Rounds were done with the Respiratory Therapy Director and Staff therapists and discussed with nursing staff also.  Allyne Gee, MD [facility] Pulmonary Critical Care Medicine Sleep Medicine

## 2021-12-29 NOTE — Telephone Encounter (Signed)
Called and notified family I got the approval to discontinue  oxygen. Nothing further needed

## 2021-12-29 NOTE — Telephone Encounter (Signed)
Spoke to patient's daughter, Emily(DPR). Isaac [name] stated that patient has been admitted since 11/15/2021.  She is requesting an order to be placed to adapt to d/c home oxygen, as it does not look like patient will be going home.   Dr. Chase Caller, please advise. Thanks

## 2021-12-29 NOTE — Telephone Encounter (Signed)
Ok to dc o2. Sorry to hear he is in hospital

## 2021-12-30 DIAGNOSIS — E43 Unspecified severe protein-calorie malnutrition: Secondary | ICD-10-CM | POA: Diagnosis not present

## 2021-12-30 DIAGNOSIS — D638 Anemia in other chronic diseases classified elsewhere: Secondary | ICD-10-CM | POA: Diagnosis not present

## 2021-12-30 DIAGNOSIS — U071 COVID-19: Secondary | ICD-10-CM | POA: Diagnosis not present

## 2021-12-30 DIAGNOSIS — J9620 Acute and chronic respiratory failure, unspecified whether with hypoxia or hypercapnia: Secondary | ICD-10-CM | POA: Diagnosis not present

## 2021-12-31 DIAGNOSIS — A419 Sepsis, unspecified organism: Secondary | ICD-10-CM | POA: Diagnosis not present

## 2021-12-31 DIAGNOSIS — J9620 Acute and chronic respiratory failure, unspecified whether with hypoxia or hypercapnia: Secondary | ICD-10-CM | POA: Diagnosis not present

## 2021-12-31 DIAGNOSIS — U071 COVID-19: Secondary | ICD-10-CM | POA: Diagnosis not present

## 2021-12-31 DIAGNOSIS — J9621 Acute and chronic respiratory failure with hypoxia: Secondary | ICD-10-CM | POA: Diagnosis not present

## 2021-12-31 DIAGNOSIS — R6520 Severe sepsis without septic shock: Secondary | ICD-10-CM | POA: Diagnosis not present

## 2021-12-31 DIAGNOSIS — D649 Anemia, unspecified: Secondary | ICD-10-CM | POA: Diagnosis not present

## 2021-12-31 DIAGNOSIS — J189 Pneumonia, unspecified organism: Secondary | ICD-10-CM | POA: Diagnosis not present

## 2021-12-31 DIAGNOSIS — J449 Chronic obstructive pulmonary disease, unspecified: Secondary | ICD-10-CM | POA: Diagnosis not present

## 2021-12-31 NOTE — Progress Notes (Signed)
Pulmonary Critical Care Medicine [place]   PULMONARY CRITICAL CARE SERVICE  PROGRESS NOTE     Isaac [name]  WLS:[id number]  DOB: 1956/10/10   DOA: 11/15/2021  Referring Physician: Satira Sark, MD  HPI: COLONEL Isaac [name] is a 66 y.o. male being followed for ventilator/airway/oxygen weaning Acute on Chronic Respiratory Failure.  Patient currently is on T collar on 35% FiO2  Medications: Reviewed on Rounds  Physical Exam:  Vitals: Patient is afebrile temperature 97 pulse 95 respiratory 22 blood pressure is 147/89 saturations 93%  Ventilator Settings off the ventilator on T collar FiO2 35%  General: Comfortable at this time Neck: supple Cardiovascular: no malignant arrhythmias Respiratory: No rhonchi no rales are noted at this time Skin: no rash seen on limited exam Musculoskeletal: No gross abnormality Psychiatric:unable to assess Neurologic:no involuntary movements         Lab Data:   Basic Metabolic Panel: Recent Labs  Lab 12/26/21 0412 12/29/21 0348  NA 143 138  K 4.3 4.2  CL 100 96*  CO2 36* 35*  GLUCOSE 141* 134*  BUN 36* 32*  CREATININE 0.65 0.70  CALCIUM 9.1 8.8*  MG 2.2 2.2  PHOS 3.7 4.8*    ABG: No results for input(s): PHART, PCO2ART, PO2ART, HCO3, O2SAT in the last 168 hours.  Liver Function Tests: No results for input(s): AST, ALT, ALKPHOS, BILITOT, PROT, ALBUMIN in the last 168 hours. No results for input(s): LIPASE, AMYLASE in the last 168 hours. No results for input(s): AMMONIA in the last 168 hours.  CBC: Recent Labs  Lab 12/25/21 0706 12/26/21 0412 12/27/21 0716 12/29/21 0348  WBC 5.7 7.1 7.5 5.9  HGB 8.6* 8.7* 8.4* 8.3*  HCT 29.5* 29.7* 29.6* 28.1*  MCV 86.0 86.3 86.3 84.4  PLT 152 153 152 143*    Cardiac Enzymes: No results for input(s): CKTOTAL, CKMB, CKMBINDEX, TROPONINI in the last 168 hours.  BNP (last 3 results) Recent Labs    03/04/21 1555 08/21/21 0504  BNP 93.2 142.9*     ProBNP (last 3 results) No results for input(s): PROBNP in the last 8760 hours.  Radiological Exams: No results found.  Assessment/Plan Active Problems:   COPD, severe (HCC)   Acute on chronic respiratory failure with hypoxia (HCC)   HCAP (healthcare-associated pneumonia)   COVID-19   Severe sepsis (HCC)   Acute on chronic respiratory failure hypoxia plan wean on T collar goal of 8 hours Severe COPD medical management we will continue to follow along closely. Healthcare associated pneumonia has been treated continue with supportive care COVID-19 virus infection in recovery Severe sepsis treated resolved   I have personally seen and evaluated the patient, evaluated laboratory and imaging results, formulated the assessment and plan and placed orders. The Patient requires high complexity decision making with multiple systems involvement.  Rounds were done with the Respiratory Therapy Director and Staff therapists and discussed with nursing staff also.  Allyne Gee, MD [facility] Pulmonary Critical Care Medicine Sleep Medicine

## 2022-01-01 DIAGNOSIS — D638 Anemia in other chronic diseases classified elsewhere: Secondary | ICD-10-CM | POA: Diagnosis not present

## 2022-01-01 DIAGNOSIS — A419 Sepsis, unspecified organism: Secondary | ICD-10-CM | POA: Diagnosis not present

## 2022-01-01 DIAGNOSIS — U071 COVID-19: Secondary | ICD-10-CM | POA: Diagnosis not present

## 2022-01-01 DIAGNOSIS — J189 Pneumonia, unspecified organism: Secondary | ICD-10-CM | POA: Diagnosis not present

## 2022-01-01 DIAGNOSIS — E43 Unspecified severe protein-calorie malnutrition: Secondary | ICD-10-CM | POA: Diagnosis not present

## 2022-01-01 DIAGNOSIS — R6520 Severe sepsis without septic shock: Secondary | ICD-10-CM | POA: Diagnosis not present

## 2022-01-01 DIAGNOSIS — J9621 Acute and chronic respiratory failure with hypoxia: Secondary | ICD-10-CM | POA: Diagnosis not present

## 2022-01-01 DIAGNOSIS — J9620 Acute and chronic respiratory failure, unspecified whether with hypoxia or hypercapnia: Secondary | ICD-10-CM | POA: Diagnosis not present

## 2022-01-01 DIAGNOSIS — J449 Chronic obstructive pulmonary disease, unspecified: Secondary | ICD-10-CM | POA: Diagnosis not present

## 2022-01-01 LAB — BASIC METABOLIC PANEL
Anion gap: 9 (ref 5–15)
BUN: 26 mg/dL — ABNORMAL HIGH (ref 8–23)
CO2: 36 mmol/L — ABNORMAL HIGH (ref 22–32)
Calcium: 8.9 mg/dL (ref 8.9–10.3)
Chloride: 92 mmol/L — ABNORMAL LOW (ref 98–111)
Creatinine, Ser: 0.7 mg/dL (ref 0.61–1.24)
GFR, Estimated: >60 mL/min (ref >60)
Glucose, Bld: 112 mg/dL — ABNORMAL HIGH (ref 70–99)
Potassium: 4.3 mmol/L (ref 3.5–5.1)
Sodium: 137 mmol/L (ref 135–145)

## 2022-01-01 LAB — CBC
HCT: 29.3 % — ABNORMAL LOW (ref 39.0–52.0)
Hemoglobin: 8.7 g/dL — ABNORMAL LOW (ref 13.0–17.0)
MCH: 24.4 pg — ABNORMAL LOW (ref 26.0–34.0)
MCHC: 29.7 g/dL — ABNORMAL LOW (ref 30.0–36.0)
MCV: 82.3 fL (ref 80.0–100.0)
Platelets: 143 10*3/uL — ABNORMAL LOW (ref 150–400)
RBC: 3.56 MIL/uL — ABNORMAL LOW (ref 4.22–5.81)
RDW: 15.9 % — ABNORMAL HIGH (ref 11.5–15.5)
WBC: 5.2 10*3/uL (ref 4.0–10.5)
nRBC: 0 % (ref 0.0–0.2)

## 2022-01-01 LAB — PHOSPHORUS: Phosphorus: 5 mg/dL — ABNORMAL HIGH (ref 2.5–4.6)

## 2022-01-01 LAB — MAGNESIUM: Magnesium: 2.2 mg/dL (ref 1.7–2.4)

## 2022-01-01 NOTE — Progress Notes (Signed)
Pulmonary Critical Care Medicine [place]   PULMONARY CRITICAL CARE SERVICE  PROGRESS NOTE     Isaac [name]  [id number]  DOB: 05/13/1956   DOA: 11/15/2021  Referring Physician: Satira Sark, MD  HPI: Isaac [name] is a 66 y.o. male being followed for ventilator/airway/oxygen weaning Acute on Chronic Respiratory Failure.  Patient is on T-piece has been on 35% FiO2  Medications: Reviewed on Rounds  Physical Exam:  Vitals: Temperature is 97.2 pulse 88 respiratory is 24 blood pressure is 141/85 saturations 100%  Ventilator Settings on T-piece and 35% FiO2 goal is for 12 hours  General: Comfortable at this time Neck: supple Cardiovascular: no malignant arrhythmias Respiratory: No rhonchi no rales are noted at this time Skin: no rash seen on limited exam Musculoskeletal: No gross abnormality Psychiatric:unable to assess Neurologic:no involuntary movements         Lab Data:   Basic Metabolic Panel: Recent Labs  Lab 12/26/21 0412 12/29/21 0348 01/01/22 0754  NA 143 138 137  K 4.3 4.2 4.3  CL 100 96* 92*  CO2 36* 35* 36*  GLUCOSE 141* 134* 112*  BUN 36* 32* 26*  CREATININE 0.65 0.70 0.70  CALCIUM 9.1 8.8* 8.9  MG 2.2 2.2 2.2  PHOS 3.7 4.8* 5.0*    ABG: No results for input(s): PHART, PCO2ART, PO2ART, HCO3, O2SAT in the last 168 hours.  Liver Function Tests: No results for input(s): AST, ALT, ALKPHOS, BILITOT, PROT, ALBUMIN in the last 168 hours. No results for input(s): LIPASE, AMYLASE in the last 168 hours. No results for input(s): AMMONIA in the last 168 hours.  CBC: Recent Labs  Lab 12/26/21 0412 12/27/21 0716 12/29/21 0348 01/01/22 0754  WBC 7.1 7.5 5.9 5.2  HGB 8.7* 8.4* 8.3* 8.7*  HCT 29.7* 29.6* 28.1* 29.3*  MCV 86.3 86.3 84.4 82.3  PLT 153 152 143* 143*    Cardiac Enzymes: No results for input(s): CKTOTAL, CKMB, CKMBINDEX, TROPONINI in the last 168 hours.  BNP (last 3 results) Recent Labs     03/04/21 1555 08/21/21 0504  BNP 93.2 142.9*    ProBNP (last 3 results) No results for input(s): PROBNP in the last 8760 hours.  Radiological Exams: No results found.  Assessment/Plan Active Problems:   COPD, severe (HCC)   Acute on chronic respiratory failure with hypoxia (HCC)   HCAP (healthcare-associated pneumonia)   COVID-19   Severe sepsis (HCC)   Acute on chronic respiratory failure with hypoxia plan is to continue with the T collar patient is on 35% FiO2 goal of 12 hours as already indicated Severe COPD medical management we will continue to follow along Healthcare associated pneumonia treated slowly improving we will continue to monitor closely. COVID-19 virus infection in recovery Severe sepsis treated in resolution   I have personally seen and evaluated the patient, evaluated laboratory and imaging results, formulated the assessment and plan and placed orders. The Patient requires high complexity decision making with multiple systems involvement.  Rounds were done with the Respiratory Therapy Director and Staff therapists and discussed with nursing staff also.  Allyne Gee, MD [facility] Pulmonary Critical Care Medicine Sleep Medicine

## 2022-01-02 DIAGNOSIS — D638 Anemia in other chronic diseases classified elsewhere: Secondary | ICD-10-CM | POA: Diagnosis not present

## 2022-01-02 DIAGNOSIS — R6520 Severe sepsis without septic shock: Secondary | ICD-10-CM | POA: Diagnosis not present

## 2022-01-02 DIAGNOSIS — E43 Unspecified severe protein-calorie malnutrition: Secondary | ICD-10-CM | POA: Diagnosis not present

## 2022-01-02 DIAGNOSIS — J189 Pneumonia, unspecified organism: Secondary | ICD-10-CM | POA: Diagnosis not present

## 2022-01-02 DIAGNOSIS — U071 COVID-19: Secondary | ICD-10-CM | POA: Diagnosis not present

## 2022-01-02 DIAGNOSIS — A419 Sepsis, unspecified organism: Secondary | ICD-10-CM | POA: Diagnosis not present

## 2022-01-02 DIAGNOSIS — J9621 Acute and chronic respiratory failure with hypoxia: Secondary | ICD-10-CM | POA: Diagnosis not present

## 2022-01-02 DIAGNOSIS — J449 Chronic obstructive pulmonary disease, unspecified: Secondary | ICD-10-CM | POA: Diagnosis not present

## 2022-01-02 DIAGNOSIS — J9620 Acute and chronic respiratory failure, unspecified whether with hypoxia or hypercapnia: Secondary | ICD-10-CM | POA: Diagnosis not present

## 2022-01-02 NOTE — Progress Notes (Signed)
Pulmonary Critical Care Medicine [place]   PULMONARY CRITICAL CARE SERVICE  PROGRESS NOTE     Isaac [name]  [id number]  DOB: May 07, 1956   DOA: 11/15/2021  Referring Physician: Satira Sark, MD  HPI: Isaac [name] is a 66 y.o. male being followed for ventilator/airway/oxygen weaning Acute on Chronic Respiratory Failure.  Patient is on T collar on 35% FiO2 the goal is for 20 hours  Medications: Reviewed on Rounds  Physical Exam:  Vitals: Temperature is 98.4 pulse 74 respiratory 24 blood pressure is 121/71 saturations 98%  Ventilator Settings on T-piece FiO2 is 35%  General: Comfortable at this time Neck: supple Cardiovascular: no malignant arrhythmias Respiratory: Scattered rhonchi expansion is equal Skin: no rash seen on limited exam Musculoskeletal: No gross abnormality Psychiatric:unable to assess Neurologic:no involuntary movements         Lab Data:   Basic Metabolic Panel: Recent Labs  Lab 12/29/21 0348 01/01/22 0754  NA 138 137  K 4.2 4.3  CL 96* 92*  CO2 35* 36*  GLUCOSE 134* 112*  BUN 32* 26*  CREATININE 0.70 0.70  CALCIUM 8.8* 8.9  MG 2.2 2.2  PHOS 4.8* 5.0*    ABG: No results for input(s): PHART, PCO2ART, PO2ART, HCO3, O2SAT in the last 168 hours.  Liver Function Tests: No results for input(s): AST, ALT, ALKPHOS, BILITOT, PROT, ALBUMIN in the last 168 hours. No results for input(s): LIPASE, AMYLASE in the last 168 hours. No results for input(s): AMMONIA in the last 168 hours.  CBC: Recent Labs  Lab 12/27/21 0716 12/29/21 0348 01/01/22 0754  WBC 7.5 5.9 5.2  HGB 8.4* 8.3* 8.7*  HCT 29.6* 28.1* 29.3*  MCV 86.3 84.4 82.3  PLT 152 143* 143*    Cardiac Enzymes: No results for input(s): CKTOTAL, CKMB, CKMBINDEX, TROPONINI in the last 168 hours.  BNP (last 3 results) Recent Labs    03/04/21 1555 08/21/21 0504  BNP 93.2 142.9*    ProBNP (last 3 results) No results for input(s): PROBNP in the  last 8760 hours.  Radiological Exams: No results found.  Assessment/Plan Active Problems:   COPD, severe (HCC)   Acute on chronic respiratory failure with hypoxia (HCC)   HCAP (healthcare-associated pneumonia)   COVID-19   Severe sepsis (HCC)   Acute on chronic respiratory failure with hypoxia continue to wean on the T-piece the goal is for 20 hours Severe COPD medical management we will continue to follow along closely. Healthcare associated pneumonia treated we will continue with supportive care COVID-19 virus infection in recovery Severe sepsis treated resolved hemodynamics are stable with   I have personally seen and evaluated the patient, evaluated laboratory and imaging results, formulated the assessment and plan and placed orders. The Patient requires high complexity decision making with multiple systems involvement.  Rounds were done with the Respiratory Therapy Director and Staff therapists and discussed with nursing staff also.  Allyne Gee, MD [facility] Pulmonary Critical Care Medicine Sleep Medicine

## 2022-01-03 DIAGNOSIS — J189 Pneumonia, unspecified organism: Secondary | ICD-10-CM | POA: Diagnosis not present

## 2022-01-03 DIAGNOSIS — J449 Chronic obstructive pulmonary disease, unspecified: Secondary | ICD-10-CM | POA: Diagnosis not present

## 2022-01-03 DIAGNOSIS — D638 Anemia in other chronic diseases classified elsewhere: Secondary | ICD-10-CM | POA: Diagnosis not present

## 2022-01-03 DIAGNOSIS — J9621 Acute and chronic respiratory failure with hypoxia: Secondary | ICD-10-CM | POA: Diagnosis not present

## 2022-01-03 DIAGNOSIS — J9620 Acute and chronic respiratory failure, unspecified whether with hypoxia or hypercapnia: Secondary | ICD-10-CM | POA: Diagnosis not present

## 2022-01-03 DIAGNOSIS — A419 Sepsis, unspecified organism: Secondary | ICD-10-CM | POA: Diagnosis not present

## 2022-01-03 DIAGNOSIS — U071 COVID-19: Secondary | ICD-10-CM | POA: Diagnosis not present

## 2022-01-03 DIAGNOSIS — R6520 Severe sepsis without septic shock: Secondary | ICD-10-CM | POA: Diagnosis not present

## 2022-01-03 DIAGNOSIS — E43 Unspecified severe protein-calorie malnutrition: Secondary | ICD-10-CM | POA: Diagnosis not present

## 2022-01-03 NOTE — Progress Notes (Signed)
Pulmonary Critical Care Medicine [place]   PULMONARY CRITICAL CARE SERVICE  PROGRESS NOTE     Isaac [name]  [id number]  DOB: July 28, 1956   DOA: 11/15/2021  Referring Physician: Satira Sark, MD  HPI: Isaac [name] is a 66 y.o. male being followed for ventilator/airway/oxygen weaning Acute on Chronic Respiratory Failure.  Patient on T-piece on 35% FiO2-goal is for 20 hours  Medications: Reviewed on Rounds  Physical Exam:  Vitals: Temperature is 97.5 pulse 90 respiratory 25 blood pressure is 131/87 saturations 97%  Ventilator Settings on T-piece on 35% FiO2  General: Comfortable at this time Neck: supple Cardiovascular: no malignant arrhythmias Respiratory: No rhonchi no rales are noted at this time. Skin: no rash seen on limited exam Musculoskeletal: No gross abnormality Psychiatric:unable to assess Neurologic:no involuntary movements         Lab Data:   Basic Metabolic Panel: Recent Labs  Lab 12/29/21 0348 01/01/22 0754  NA 138 137  K 4.2 4.3  CL 96* 92*  CO2 35* 36*  GLUCOSE 134* 112*  BUN 32* 26*  CREATININE 0.70 0.70  CALCIUM 8.8* 8.9  MG 2.2 2.2  PHOS 4.8* 5.0*    ABG: No results for input(s): PHART, PCO2ART, PO2ART, HCO3, O2SAT in the last 168 hours.  Liver Function Tests: No results for input(s): AST, ALT, ALKPHOS, BILITOT, PROT, ALBUMIN in the last 168 hours. No results for input(s): LIPASE, AMYLASE in the last 168 hours. No results for input(s): AMMONIA in the last 168 hours.  CBC: Recent Labs  Lab 12/29/21 0348 01/01/22 0754  WBC 5.9 5.2  HGB 8.3* 8.7*  HCT 28.1* 29.3*  MCV 84.4 82.3  PLT 143* 143*    Cardiac Enzymes: No results for input(s): CKTOTAL, CKMB, CKMBINDEX, TROPONINI in the last 168 hours.  BNP (last 3 results) Recent Labs    03/04/21 1555 08/21/21 0504  BNP 93.2 142.9*    ProBNP (last 3 results) No results for input(s): PROBNP in the last 8760 hours.  Radiological  Exams: No results found.  Assessment/Plan Active Problems:   COPD, severe (HCC)   Acute on chronic respiratory failure with hypoxia (HCC)   HCAP (healthcare-associated pneumonia)   COVID-19   Severe sepsis (HCC)   Acute on chronic respiratory failure hypoxia currently is on T-piece on 35% FiO2 the goal is for 20 hours Severe COPD medical management nebulizers as needed COVID-19 virus infection in recovery phase we will continue to monitor. Healthcare associated pneumonia has been treated we will continue to follow along Severe sepsis resolved   I have personally seen and evaluated the patient, evaluated laboratory and imaging results, formulated the assessment and plan and placed orders. The Patient requires high complexity decision making with multiple systems involvement.  Rounds were done with the Respiratory Therapy Director and Staff therapists and discussed with nursing staff also.  Allyne Gee, MD [facility] Pulmonary Critical Care Medicine Sleep Medicine

## 2022-01-04 ENCOUNTER — Other Ambulatory Visit (HOSPITAL_COMMUNITY): Payer: Self-pay

## 2022-01-04 DIAGNOSIS — R6520 Severe sepsis without septic shock: Secondary | ICD-10-CM | POA: Diagnosis not present

## 2022-01-04 DIAGNOSIS — E43 Unspecified severe protein-calorie malnutrition: Secondary | ICD-10-CM | POA: Diagnosis not present

## 2022-01-04 DIAGNOSIS — J9621 Acute and chronic respiratory failure with hypoxia: Secondary | ICD-10-CM | POA: Diagnosis not present

## 2022-01-04 DIAGNOSIS — D638 Anemia in other chronic diseases classified elsewhere: Secondary | ICD-10-CM | POA: Diagnosis not present

## 2022-01-04 DIAGNOSIS — J449 Chronic obstructive pulmonary disease, unspecified: Secondary | ICD-10-CM | POA: Diagnosis not present

## 2022-01-04 DIAGNOSIS — U071 COVID-19: Secondary | ICD-10-CM | POA: Diagnosis not present

## 2022-01-04 DIAGNOSIS — J189 Pneumonia, unspecified organism: Secondary | ICD-10-CM | POA: Diagnosis not present

## 2022-01-04 DIAGNOSIS — J9620 Acute and chronic respiratory failure, unspecified whether with hypoxia or hypercapnia: Secondary | ICD-10-CM | POA: Diagnosis not present

## 2022-01-04 DIAGNOSIS — A419 Sepsis, unspecified organism: Secondary | ICD-10-CM | POA: Diagnosis not present

## 2022-01-04 NOTE — Progress Notes (Signed)
Pulmonary Critical Care Medicine [place]   PULMONARY CRITICAL CARE SERVICE  PROGRESS NOTE     Isaac [name]  [id number]:[id number]  DOB: [birth year]   DOA: 11/15/2021  Referring Physician: Satira Sark, MD  HPI: Isaac [name] is a 66 y.o. male being followed for ventilator/airway/oxygen weaning Acute on Chronic Respiratory Failure.  Patient is on T-piece has been on 35% FiO2 today will be trying to complete 24 hours  Medications: Reviewed on Rounds  Physical Exam:  Vitals: Temperature is 97.5 pulse 87 respiratory 22 blood pressure is 162/90 saturations 97%  Ventilator Settings on T-piece FiO2 of 35%  General: Comfortable at this time Neck: supple Cardiovascular: no malignant arrhythmias Respiratory: Scattered rhonchi very coarse breath sounds Skin: no rash seen on limited exam Musculoskeletal: No gross abnormality Psychiatric:unable to assess Neurologic:no involuntary movements         Lab Data:   Basic Metabolic Panel: Recent Labs  Lab 12/29/21 0348 01/01/22 0754  NA 138 137  K 4.2 4.3  CL 96* 92*  CO2 35* 36*  GLUCOSE 134* 112*  BUN 32* 26*  CREATININE 0.70 0.70  CALCIUM 8.8* 8.9  MG 2.2 2.2  PHOS 4.8* 5.0*    ABG: No results for input(s): PHART, PCO2ART, PO2ART, HCO3, O2SAT in the last 168 hours.  Liver Function Tests: No results for input(s): AST, ALT, ALKPHOS, BILITOT, PROT, ALBUMIN in the last 168 hours. No results for input(s): LIPASE, AMYLASE in the last 168 hours. No results for input(s): AMMONIA in the last 168 hours.  CBC: Recent Labs  Lab 12/29/21 0348 01/01/22 0754  WBC 5.9 5.2  HGB 8.3* 8.7*  HCT 28.1* 29.3*  MCV 84.4 82.3  PLT 143* 143*    Cardiac Enzymes: No results for input(s): CKTOTAL, CKMB, CKMBINDEX, TROPONINI in the last 168 hours.  BNP (last 3 results) Recent Labs    03/04/21 1555 08/21/21 0504  BNP 93.2 142.9*    ProBNP (last 3 results) No results for input(s): PROBNP in the last  8760 hours.  Radiological Exams: No results found.  Assessment/Plan Active Problems:   COPD, severe (HCC)   Acute on chronic respiratory failure with hypoxia (HCC)   HCAP (healthcare-associated pneumonia)   COVID-19   Severe sepsis (HCC)   Acute on chronic respiratory failure with hypoxia we will continue with T-piece patient's goal today is for 24 hours Severe COPD medical management inhalers nebulizers as needed Healthcare associated pneumonia clinically is improved COVID-19 virus infection improving Severe sepsis treated resolved hemodynamics are stable   I have personally seen and evaluated the patient, evaluated laboratory and imaging results, formulated the assessment and plan and placed orders. The Patient requires high complexity decision making with multiple systems involvement.  Rounds were done with the Respiratory Therapy Director and Staff therapists and discussed with nursing staff also.  Allyne Gee, MD [facility] Pulmonary Critical Care Medicine Sleep Medicine

## 2022-01-05 DIAGNOSIS — R6520 Severe sepsis without septic shock: Secondary | ICD-10-CM | POA: Diagnosis not present

## 2022-01-05 DIAGNOSIS — E43 Unspecified severe protein-calorie malnutrition: Secondary | ICD-10-CM | POA: Diagnosis not present

## 2022-01-05 DIAGNOSIS — U071 COVID-19: Secondary | ICD-10-CM | POA: Diagnosis not present

## 2022-01-05 DIAGNOSIS — A419 Sepsis, unspecified organism: Secondary | ICD-10-CM | POA: Diagnosis not present

## 2022-01-05 DIAGNOSIS — J9620 Acute and chronic respiratory failure, unspecified whether with hypoxia or hypercapnia: Secondary | ICD-10-CM | POA: Diagnosis not present

## 2022-01-05 DIAGNOSIS — J189 Pneumonia, unspecified organism: Secondary | ICD-10-CM | POA: Diagnosis not present

## 2022-01-05 DIAGNOSIS — D638 Anemia in other chronic diseases classified elsewhere: Secondary | ICD-10-CM | POA: Diagnosis not present

## 2022-01-05 DIAGNOSIS — J9621 Acute and chronic respiratory failure with hypoxia: Secondary | ICD-10-CM | POA: Diagnosis not present

## 2022-01-05 DIAGNOSIS — J449 Chronic obstructive pulmonary disease, unspecified: Secondary | ICD-10-CM | POA: Diagnosis not present

## 2022-01-05 LAB — RENAL FUNCTION PANEL
Albumin: 3 g/dL — ABNORMAL LOW (ref 3.5–5.0)
Anion gap: 8 (ref 5–15)
BUN: 20 mg/dL (ref 8–23)
CO2: 37 mmol/L — ABNORMAL HIGH (ref 22–32)
Calcium: 9.2 mg/dL (ref 8.9–10.3)
Chloride: 93 mmol/L — ABNORMAL LOW (ref 98–111)
Creatinine, Ser: 0.62 mg/dL (ref 0.61–1.24)
GFR, Estimated: >60 mL/min (ref >60)
Glucose, Bld: 131 mg/dL — ABNORMAL HIGH (ref 70–99)
Phosphorus: 5.3 mg/dL — ABNORMAL HIGH (ref 2.5–4.6)
Potassium: 4.1 mmol/L (ref 3.5–5.1)
Sodium: 138 mmol/L (ref 135–145)

## 2022-01-05 LAB — CBC
HCT: 30.6 % — ABNORMAL LOW (ref 39.0–52.0)
Hemoglobin: 9.2 g/dL — ABNORMAL LOW (ref 13.0–17.0)
MCH: 24.7 pg — ABNORMAL LOW (ref 26.0–34.0)
MCHC: 30.1 g/dL (ref 30.0–36.0)
MCV: 82 fL (ref 80.0–100.0)
Platelets: 125 10*3/uL — ABNORMAL LOW (ref 150–400)
RBC: 3.73 MIL/uL — ABNORMAL LOW (ref 4.22–5.81)
RDW: 15.4 % (ref 11.5–15.5)
WBC: 4.8 10*3/uL (ref 4.0–10.5)
nRBC: 0 % (ref 0.0–0.2)

## 2022-01-05 LAB — MAGNESIUM: Magnesium: 2.2 mg/dL (ref 1.7–2.4)

## 2022-01-05 NOTE — Progress Notes (Signed)
Pulmonary Critical Care Medicine [place]   PULMONARY CRITICAL CARE SERVICE  PROGRESS NOTE     Isaac [name]  VEL:[id number]  DOB: 1955-12-08   DOA: 11/15/2021  Referring Physician: Satira Sark, MD  HPI: Isaac [name] is a 66 y.o. male being followed for ventilator/airway/oxygen weaning Acute on Chronic Respiratory Failure.  Patient is on assist control on 35% FiO2 supposed to do 20 hours ago weaning  Medications: Reviewed on Rounds  Physical Exam:  Vitals: Temperature 97.7 pulse 97 respiratory 24 blood pressure is 138/76 saturations 97%  Ventilator Settings assist control FiO2 is 35% tidal volume 580 PEEP 5  General: Comfortable at this time Neck: supple Cardiovascular: no malignant arrhythmias Respiratory: No rhonchi no rales are noted at this time Skin: no rash seen on limited exam Musculoskeletal: No gross abnormality Psychiatric:unable to assess Neurologic:no involuntary movements         Lab Data:   Basic Metabolic Panel: Recent Labs  Lab 01/01/22 0754 01/05/22 0441  NA 137 138  K 4.3 4.1  CL 92* 93*  CO2 36* 37*  GLUCOSE 112* 131*  BUN 26* 20  CREATININE 0.70 0.62  CALCIUM 8.9 9.2  MG 2.2 2.2  PHOS 5.0* 5.3*    ABG: No results for input(s): PHART, PCO2ART, PO2ART, HCO3, O2SAT in the last 168 hours.  Liver Function Tests: Recent Labs  Lab 01/05/22 0441  ALBUMIN 3.0*   No results for input(s): LIPASE, AMYLASE in the last 168 hours. No results for input(s): AMMONIA in the last 168 hours.  CBC: Recent Labs  Lab 01/01/22 0754 01/05/22 0441  WBC 5.2 4.8  HGB 8.7* 9.2*  HCT 29.3* 30.6*  MCV 82.3 82.0  PLT 143* 125*    Cardiac Enzymes: No results for input(s): CKTOTAL, CKMB, CKMBINDEX, TROPONINI in the last 168 hours.  BNP (last 3 results) Recent Labs    03/04/21 1555 08/21/21 0504  BNP 93.2 142.9*    ProBNP (last 3 results) No results for input(s): PROBNP in the last 8760 hours.  Radiological  Exams: No results found.  Assessment/Plan Active Problems:   COPD, severe (HCC)   Acute on chronic respiratory failure with hypoxia (HCC)   HCAP (healthcare-associated pneumonia)   COVID-19   Severe sepsis (HCC)   Acute on chronic respiratory failure with hypoxia patient is going to try for T-piece on 20 hours again Severe COPD medical management continue to follow along closely Healthcare associated pneumonia has been treated we will continue to monitor COVID-19 virus infection in recovery Severe sepsis treated resolved   I have personally seen and evaluated the patient, evaluated laboratory and imaging results, formulated the assessment and plan and placed orders. The Patient requires high complexity decision making with multiple systems involvement.  Rounds were done with the Respiratory Therapy Director and Staff therapists and discussed with nursing staff also.  Allyne Gee, MD [facility] Pulmonary Critical Care Medicine Sleep Medicine

## 2022-01-06 DIAGNOSIS — J9621 Acute and chronic respiratory failure with hypoxia: Secondary | ICD-10-CM | POA: Diagnosis not present

## 2022-01-06 DIAGNOSIS — U071 COVID-19: Secondary | ICD-10-CM | POA: Diagnosis not present

## 2022-01-06 DIAGNOSIS — D649 Anemia, unspecified: Secondary | ICD-10-CM | POA: Diagnosis not present

## 2022-01-06 DIAGNOSIS — J189 Pneumonia, unspecified organism: Secondary | ICD-10-CM | POA: Diagnosis not present

## 2022-01-06 DIAGNOSIS — J449 Chronic obstructive pulmonary disease, unspecified: Secondary | ICD-10-CM | POA: Diagnosis not present

## 2022-01-06 DIAGNOSIS — A419 Sepsis, unspecified organism: Secondary | ICD-10-CM | POA: Diagnosis not present

## 2022-01-06 DIAGNOSIS — R6520 Severe sepsis without septic shock: Secondary | ICD-10-CM | POA: Diagnosis not present

## 2022-01-06 DIAGNOSIS — J9620 Acute and chronic respiratory failure, unspecified whether with hypoxia or hypercapnia: Secondary | ICD-10-CM | POA: Diagnosis not present

## 2022-01-06 NOTE — Progress Notes (Signed)
Pulmonary Critical Care Medicine [place]   PULMONARY CRITICAL CARE SERVICE  PROGRESS NOTE     Isaac [name]  [id number]  DOB: 19-Sep-1956   DOA: 11/15/2021  Referring Physician: Satira Sark, MD  HPI: Isaac [name] is a 66 y.o. male being followed for ventilator/airway/oxygen weaning Acute on Chronic Respiratory Failure.  Patient is afebrile resting comfortably did 20 hours of T collar yesterday this morning so far was still on assist control  Medications: Reviewed on Rounds  Physical Exam:  Vitals: Temperature is 97.4 pulse 76 respiratory 19 blood pressure is 173/96 saturations 96%  Ventilator Settings on assist control FiO2 35% tidal volume 580 PEEP 5  General: Comfortable at this time Neck: supple Cardiovascular: no malignant arrhythmias Respiratory: No rhonchi very coarse breath sounds Skin: no rash seen on limited exam Musculoskeletal: No gross abnormality Psychiatric:unable to assess Neurologic:no involuntary movements         Lab Data:   Basic Metabolic Panel: Recent Labs  Lab 01/01/22 0754 01/05/22 0441  NA 137 138  K 4.3 4.1  CL 92* 93*  CO2 36* 37*  GLUCOSE 112* 131*  BUN 26* 20  CREATININE 0.70 0.62  CALCIUM 8.9 9.2  MG 2.2 2.2  PHOS 5.0* 5.3*    ABG: No results for input(s): PHART, PCO2ART, PO2ART, HCO3, O2SAT in the last 168 hours.  Liver Function Tests: Recent Labs  Lab 01/05/22 0441  ALBUMIN 3.0*   No results for input(s): LIPASE, AMYLASE in the last 168 hours. No results for input(s): AMMONIA in the last 168 hours.  CBC: Recent Labs  Lab 01/01/22 0754 01/05/22 0441  WBC 5.2 4.8  HGB 8.7* 9.2*  HCT 29.3* 30.6*  MCV 82.3 82.0  PLT 143* 125*    Cardiac Enzymes: No results for input(s): CKTOTAL, CKMB, CKMBINDEX, TROPONINI in the last 168 hours.  BNP (last 3 results) Recent Labs    03/04/21 1555 08/21/21 0504  BNP 93.2 142.9*    ProBNP (last 3 results) No results for input(s):  PROBNP in the last 8760 hours.  Radiological Exams: No results found.  Assessment/Plan Active Problems:   COPD, severe (HCC)   Acute on chronic respiratory failure with hypoxia (HCC)   HCAP (healthcare-associated pneumonia)   COVID-19   Severe sepsis (HCC)   Acute on chronic respiratory failure hypoxia plan is to try for T collar wean for 24 hours today as tolerated Severe COPD medical management we will continue to monitor. Healthcare associated pneumonia treated we will continue to follow along closely COVID-19 virus infection in recovery phase Severe sepsis resolved   I have personally seen and evaluated the patient, evaluated laboratory and imaging results, formulated the assessment and plan and placed orders. The Patient requires high complexity decision making with multiple systems involvement.  Rounds were done with the Respiratory Therapy Director and Staff therapists and discussed with nursing staff also.  Allyne Gee, MD [facility] Pulmonary Critical Care Medicine Sleep Medicine

## 2022-01-07 DIAGNOSIS — D638 Anemia in other chronic diseases classified elsewhere: Secondary | ICD-10-CM | POA: Diagnosis not present

## 2022-01-07 DIAGNOSIS — U071 COVID-19: Secondary | ICD-10-CM | POA: Diagnosis not present

## 2022-01-07 DIAGNOSIS — E43 Unspecified severe protein-calorie malnutrition: Secondary | ICD-10-CM | POA: Diagnosis not present

## 2022-01-07 DIAGNOSIS — J9620 Acute and chronic respiratory failure, unspecified whether with hypoxia or hypercapnia: Secondary | ICD-10-CM | POA: Diagnosis not present

## 2022-01-07 LAB — BASIC METABOLIC PANEL
Anion gap: 7 (ref 5–15)
BUN: 20 mg/dL (ref 8–23)
CO2: 41 mmol/L — ABNORMAL HIGH (ref 22–32)
Calcium: 9.1 mg/dL (ref 8.9–10.3)
Chloride: 93 mmol/L — ABNORMAL LOW (ref 98–111)
Creatinine, Ser: 0.68 mg/dL (ref 0.61–1.24)
GFR, Estimated: >60 mL/min (ref >60)
Glucose, Bld: 159 mg/dL — ABNORMAL HIGH (ref 70–99)
Potassium: 5 mmol/L (ref 3.5–5.1)
Sodium: 141 mmol/L (ref 135–145)

## 2022-01-07 LAB — CBC
HCT: 33.2 % — ABNORMAL LOW (ref 39.0–52.0)
Hemoglobin: 9.5 g/dL — ABNORMAL LOW (ref 13.0–17.0)
MCH: 23.8 pg — ABNORMAL LOW (ref 26.0–34.0)
MCHC: 28.6 g/dL — ABNORMAL LOW (ref 30.0–36.0)
MCV: 83.2 fL (ref 80.0–100.0)
Platelets: 123 10*3/uL — ABNORMAL LOW (ref 150–400)
RBC: 3.99 MIL/uL — ABNORMAL LOW (ref 4.22–5.81)
RDW: 15.3 % (ref 11.5–15.5)
WBC: 5.3 10*3/uL (ref 4.0–10.5)
nRBC: 0 % (ref 0.0–0.2)

## 2022-01-07 LAB — MAGNESIUM: Magnesium: 2.4 mg/dL (ref 1.7–2.4)

## 2022-01-08 DIAGNOSIS — J449 Chronic obstructive pulmonary disease, unspecified: Secondary | ICD-10-CM | POA: Diagnosis not present

## 2022-01-08 DIAGNOSIS — R6520 Severe sepsis without septic shock: Secondary | ICD-10-CM | POA: Diagnosis not present

## 2022-01-08 DIAGNOSIS — J189 Pneumonia, unspecified organism: Secondary | ICD-10-CM | POA: Diagnosis not present

## 2022-01-08 DIAGNOSIS — J9621 Acute and chronic respiratory failure with hypoxia: Secondary | ICD-10-CM | POA: Diagnosis not present

## 2022-01-08 DIAGNOSIS — A419 Sepsis, unspecified organism: Secondary | ICD-10-CM | POA: Diagnosis not present

## 2022-01-08 DIAGNOSIS — U071 COVID-19: Secondary | ICD-10-CM | POA: Diagnosis not present

## 2022-01-08 DIAGNOSIS — D638 Anemia in other chronic diseases classified elsewhere: Secondary | ICD-10-CM | POA: Diagnosis not present

## 2022-01-08 DIAGNOSIS — J9620 Acute and chronic respiratory failure, unspecified whether with hypoxia or hypercapnia: Secondary | ICD-10-CM | POA: Diagnosis not present

## 2022-01-08 DIAGNOSIS — E43 Unspecified severe protein-calorie malnutrition: Secondary | ICD-10-CM | POA: Diagnosis not present

## 2022-01-08 NOTE — Progress Notes (Signed)
Pulmonary Critical Care Medicine [place]   PULMONARY CRITICAL CARE SERVICE  PROGRESS NOTE     Isaac [name]  [id number]:[id number]  DOB: 1956/04/17   DOA: 11/15/2021  Referring Physician: Satira Sark, MD  HPI: Isaac [name] is a 66 y.o. male being followed for ventilator/airway/oxygen weaning Acute on Chronic Respiratory Failure.  Patient is resting on assist control mode supposed to be doing T collar once again attempting 20 hours  Medications: Reviewed on Rounds  Physical Exam:  Vitals: Temperature is 97.3 pulse 75 respiratory is 18 blood pressure is 102/63 saturations 97%  Ventilator Settings currently assist-control FiO2 35% tidal volume 600 PEEP of 5  General: Comfortable at this time Neck: supple Cardiovascular: no malignant arrhythmias Respiratory: No rhonchi no rales are noted at this time. Skin: no rash seen on limited exam Musculoskeletal: No gross abnormality Psychiatric:unable to assess Neurologic:no involuntary movements         Lab Data:   Basic Metabolic Panel: Recent Labs  Lab 01/05/22 0441 01/07/22 0910  NA 138 141  K 4.1 5.0  CL 93* 93*  CO2 37* 41*  GLUCOSE 131* 159*  BUN 20 20  CREATININE 0.62 0.68  CALCIUM 9.2 9.1  MG 2.2 2.4  PHOS 5.3*  --     ABG: No results for input(s): PHART, PCO2ART, PO2ART, HCO3, O2SAT in the last 168 hours.  Liver Function Tests: Recent Labs  Lab 01/05/22 0441  ALBUMIN 3.0*   No results for input(s): LIPASE, AMYLASE in the last 168 hours. No results for input(s): AMMONIA in the last 168 hours.  CBC: Recent Labs  Lab 01/05/22 0441 01/07/22 0910  WBC 4.8 5.3  HGB 9.2* 9.5*  HCT 30.6* 33.2*  MCV 82.0 83.2  PLT 125* 123*    Cardiac Enzymes: No results for input(s): CKTOTAL, CKMB, CKMBINDEX, TROPONINI in the last 168 hours.  BNP (last 3 results) Recent Labs    03/04/21 1555 08/21/21 0504  BNP 93.2 142.9*    ProBNP (last 3 results) No results for input(s):  PROBNP in the last 8760 hours.  Radiological Exams: No results found.  Assessment/Plan Active Problems:   COPD, severe (HCC)   Acute on chronic respiratory failure with hypoxia (HCC)   HCAP (healthcare-associated pneumonia)   COVID-19   Severe sepsis (HCC)   Acute on chronic respiratory failure hypoxia plan is going to be to continue with the weaning attempt on T collar.  Titrate oxygen as tolerated continue pulmonary toilet. Severe COPD medical management we will continue to monitor closely. Healthcare associated pneumonia has been treated we will continue to follow COVID-19 virus infection in recovery Severe sepsis treated resolving   I have personally seen and evaluated the patient, evaluated laboratory and imaging results, formulated the assessment and plan and placed orders. The Patient requires high complexity decision making with multiple systems involvement.  Rounds were done with the Respiratory Therapy Director and Staff therapists and discussed with nursing staff also.  Allyne Gee, MD [facility] Pulmonary Critical Care Medicine Sleep Medicine

## 2022-01-09 DIAGNOSIS — D638 Anemia in other chronic diseases classified elsewhere: Secondary | ICD-10-CM | POA: Diagnosis not present

## 2022-01-09 DIAGNOSIS — E43 Unspecified severe protein-calorie malnutrition: Secondary | ICD-10-CM | POA: Diagnosis not present

## 2022-01-09 DIAGNOSIS — U071 COVID-19: Secondary | ICD-10-CM | POA: Diagnosis not present

## 2022-01-09 DIAGNOSIS — J9620 Acute and chronic respiratory failure, unspecified whether with hypoxia or hypercapnia: Secondary | ICD-10-CM | POA: Diagnosis not present

## 2022-01-09 DIAGNOSIS — J189 Pneumonia, unspecified organism: Secondary | ICD-10-CM | POA: Diagnosis not present

## 2022-01-09 DIAGNOSIS — R6520 Severe sepsis without septic shock: Secondary | ICD-10-CM | POA: Diagnosis not present

## 2022-01-09 DIAGNOSIS — A419 Sepsis, unspecified organism: Secondary | ICD-10-CM | POA: Diagnosis not present

## 2022-01-09 DIAGNOSIS — J9621 Acute and chronic respiratory failure with hypoxia: Secondary | ICD-10-CM | POA: Diagnosis not present

## 2022-01-09 DIAGNOSIS — J449 Chronic obstructive pulmonary disease, unspecified: Secondary | ICD-10-CM | POA: Diagnosis not present

## 2022-01-09 LAB — BASIC METABOLIC PANEL
Anion gap: 10 (ref 5–15)
BUN: 25 mg/dL — ABNORMAL HIGH (ref 8–23)
CO2: 36 mmol/L — ABNORMAL HIGH (ref 22–32)
Calcium: 9.1 mg/dL (ref 8.9–10.3)
Chloride: 92 mmol/L — ABNORMAL LOW (ref 98–111)
Creatinine, Ser: 0.84 mg/dL (ref 0.61–1.24)
GFR, Estimated: >60 mL/min (ref >60)
Glucose, Bld: 127 mg/dL — ABNORMAL HIGH (ref 70–99)
Potassium: 4.5 mmol/L (ref 3.5–5.1)
Sodium: 138 mmol/L (ref 135–145)

## 2022-01-09 LAB — CBC
HCT: 30.6 % — ABNORMAL LOW (ref 39.0–52.0)
Hemoglobin: 8.8 g/dL — ABNORMAL LOW (ref 13.0–17.0)
MCH: 23.8 pg — ABNORMAL LOW (ref 26.0–34.0)
MCHC: 28.8 g/dL — ABNORMAL LOW (ref 30.0–36.0)
MCV: 82.7 fL (ref 80.0–100.0)
Platelets: 113 10*3/uL — ABNORMAL LOW (ref 150–400)
RBC: 3.7 MIL/uL — ABNORMAL LOW (ref 4.22–5.81)
RDW: 15.5 % (ref 11.5–15.5)
WBC: 4.9 10*3/uL (ref 4.0–10.5)
nRBC: 0 % (ref 0.0–0.2)

## 2022-01-09 LAB — MAGNESIUM: Magnesium: 2.4 mg/dL (ref 1.7–2.4)

## 2022-01-09 LAB — PHOSPHORUS: Phosphorus: 5.3 mg/dL — ABNORMAL HIGH (ref 2.5–4.6)

## 2022-01-09 NOTE — Progress Notes (Signed)
Pulmonary Critical Care Medicine [place]   PULMONARY CRITICAL CARE SERVICE  PROGRESS NOTE     Isaac [name]  YIR:[id number]  DOB: March 04, 1956   DOA: 11/15/2021  Referring Physician: Satira Sark, MD  HPI: [name] is a 66 y.o. male being followed for ventilator/airway/oxygen weaning Acute on Chronic Respiratory Failure.  Patient has been doing okay with being on the T collar during the daytime and has been going back on the ventilator at nighttime to rest  Medications: Reviewed on Rounds  Physical Exam:  Vitals: Temperature is 97.8 pulse 75 respiratory 16 blood pressure is 92/50 saturations 96%  Ventilator Settings on T-piece  General: Comfortable at this time Neck: supple Cardiovascular: no malignant arrhythmias Respiratory: No rhonchi very coarse breath sounds Skin: no rash seen on limited exam Musculoskeletal: No gross abnormality Psychiatric:unable to assess Neurologic:no involuntary movements         Lab Data:   Basic Metabolic Panel: Recent Labs  Lab 01/05/22 0441 01/07/22 0910 01/09/22 0419  NA 138 141 138  K 4.1 5.0 4.5  CL 93* 93* 92*  CO2 37* 41* 36*  GLUCOSE 131* 159* 127*  BUN 20 20 25*  CREATININE 0.62 0.68 0.84  CALCIUM 9.2 9.1 9.1  MG 2.2 2.4 2.4  PHOS 5.3*  --  5.3*    ABG: No results for input(s): PHART, PCO2ART, PO2ART, HCO3, O2SAT in the last 168 hours.  Liver Function Tests: Recent Labs  Lab 01/05/22 0441  ALBUMIN 3.0*   No results for input(s): LIPASE, AMYLASE in the last 168 hours. No results for input(s): AMMONIA in the last 168 hours.  CBC: Recent Labs  Lab 01/05/22 0441 01/07/22 0910 01/09/22 0419  WBC 4.8 5.3 4.9  HGB 9.2* 9.5* 8.8*  HCT 30.6* 33.2* 30.6*  MCV 82.0 83.2 82.7  PLT 125* 123* 113*    Cardiac Enzymes: No results for input(s): CKTOTAL, CKMB, CKMBINDEX, TROPONINI in the last 168 hours.  BNP (last 3 results) Recent Labs    03/04/21 1555 08/21/21 0504  BNP  93.2 142.9*    ProBNP (last 3 results) No results for input(s): PROBNP in the last 8760 hours.  Radiological Exams: No results found.  Assessment/Plan Active Problems:   COPD, severe (HCC)   Acute on chronic respiratory failure with hypoxia (HCC)   HCAP (healthcare-associated pneumonia)   COVID-19   Severe sepsis (HCC)   Acute on chronic respiratory failure with hypoxia we will continue with T-piece patient is going to be resting on the ventilator at nighttime Severe COPD medical management we will continue to follow along closely. Healthcare associated pneumonia has been treated continue to follow-up COVID-19 in resolution Severe sepsis resolved   I have personally seen and evaluated the patient, evaluated laboratory and imaging results, formulated the assessment and plan and placed orders. The Patient requires high complexity decision making with multiple systems involvement.  Rounds were done with the Respiratory Therapy Director and Staff therapists and discussed with nursing staff also.  Allyne Gee, MD [facility] Pulmonary Critical Care Medicine Sleep Medicine

## 2022-01-09 NOTE — Consult Note (Signed)
Referring Physician: S. Owens Shark, MD  Isaac [name] is an 66 y.o. male.                       Chief Complaint: Abnormal EKG  HPI: 66 years old male with PMH of acute on chronic respiratory failure, COPD, COVID infection, GERD, HLD, HTN, tobacco use disorder and tracheostomy for failed extubation has episodes of sinus tachycardia with RBBB on cardiac telemetry. Patient poorly communicates.  Past Medical History:  Diagnosis Date   Acute on chronic respiratory failure with hypoxia (HCC)    Anxiety    Chronic lymphocytic leukemia in remission (HCC)    COPD (chronic obstructive pulmonary disease) (HCC)    COPD, severe (HCC)    GERD (gastroesophageal reflux disease)    Hematuria    Hyperlipidemia    Hypertension    Insomnia    Left lower lobe pneumonia    Nephrolithiasis    Tobacco dependence       Past Surgical History:  Procedure Laterality Date   IR GASTROSTOMY TUBE MOD SED  12/05/2021   LITHOTRIPSY      Family History  Problem Relation Age of Onset   Heart disease Father    Stroke Mother    Leukemia Brother    Social History:  reports that he quit smoking about 13 years ago. His smoking use included cigarettes. He has a 35.00 pack-year smoking history. He has never used smokeless tobacco. No history on file for alcohol use and drug use.  Allergies:  Allergies  Allergen Reactions   Codeine Itching and Other (See Comments)    "jittery"   Prozac [Fluoxetine Hcl] Other (See Comments)    confusion    Medications Prior to Admission  Medication Sig Dispense Refill   albuterol (PROVENTIL) (2.5 MG/3ML) 0.083% nebulizer solution Take 3 mLs (2.5 mg total) by nebulization every 4 (four) hours as needed for shortness of breath or wheezing. 75 mL 12   antiseptic oral rinse (BIOTENE) LIQD 15 mLs by Mouth Rinse route as needed for dry mouth.     arformoterol (BROVANA) 15 MCG/2ML NEBU Take 2 mLs (15 mcg total) by nebulization 2 (two) times daily.     budesonide (PULMICORT) 0.5 MG/2ML  nebulizer solution Take 2 mLs (0.5 mg total) by nebulization 2 (two) times daily. 1 mL 0   chlorhexidine gluconate, MEDLINE KIT, (PERIDEX) 0.12 % solution 15 mLs by Mouth Rinse route 2 (two) times daily. 120 mL 0   enoxaparin (LOVENOX) 40 MG/0.4ML injection Inject 0.4 mLs (40 mg total) into the skin daily. 0 mL    feeding supplement (ENSURE ENLIVE / ENSURE PLUS) LIQD Take 237 mLs by mouth 3 (three) times daily between meals. 237 mL 12   gabapentin (NEURONTIN) 250 MG/5ML solution Place 2 mLs (100 mg total) into feeding tube daily.  12   hydrALAZINE (APRESOLINE) 20 MG/ML injection Inject 0.5 mLs (10 mg total) into the vein every 6 (six) hours as needed (SBP >170).     hydrOXYzine (ATARAX) 10 MG/5ML syrup Place 25 mLs (50 mg total) into feeding tube 3 (three) times daily as needed for itching or anxiety.  0   metoprolol tartrate (LOPRESSOR) 25 MG tablet Place 1 tablet (25 mg total) into feeding tube every 6 (six) hours.     Nutritional Supplements (FEEDING SUPPLEMENT, OSMOLITE 1.2 CAL,) LIQD Place 1,000 mLs into feeding tube continuous.     Nutritional Supplements (FEEDING SUPPLEMENT, PROSOURCE TF,) liquid Place 45 mLs into feeding tube 4 (four)  times daily.     oxyCODONE 10 MG TABS Place 1 tablet (10 mg total) into feeding tube every 6 (six) hours as needed for severe pain.  0   pantoprazole sodium (PROTONIX) 40 mg Place 40 mg into feeding tube at bedtime.     polyethylene glycol (MIRALAX / GLYCOLAX) 17 g packet Place 17 g into feeding tube daily as needed for mild constipation.  0   revefenacin (YUPELRI) 175 MCG/3ML nebulizer solution Take 3 mLs (175 mcg total) by nebulization daily. 90 mL    Water For Irrigation, Sterile (FREE WATER) SOLN Place 200 mLs into feeding tube every 8 (eight) hours.      Results for orders placed or performed during the hospital encounter of 11/15/21 (from the past 48 hour(s))  CBC     Status: Abnormal   Collection Time: 01/09/22  4:19 AM  Result Value Ref Range   WBC  4.9 4.0 - 10.5 K/uL   RBC 3.70 (L) 4.22 - 5.81 MIL/uL   Hemoglobin 8.8 (L) 13.0 - 17.0 g/dL   HCT 30.6 (L) 39.0 - 52.0 %   MCV 82.7 80.0 - 100.0 fL   MCH 23.8 (L) 26.0 - 34.0 pg   MCHC 28.8 (L) 30.0 - 36.0 g/dL   RDW 15.5 11.5 - 15.5 %   Platelets 113 (L) 150 - 400 K/uL    Comment: Immature Platelet Fraction may be clinically indicated, consider ordering this additional test [id number] REPEATED TO VERIFY PLATELET COUNT CONFIRMED BY SMEAR    nRBC 0.0 0.0 - 0.2 %    Comment: Performed at [facility], [place] [address], [place], [place] [address]  Basic metabolic panel     Status: Abnormal   Collection Time: 01/09/22  4:19 AM  Result Value Ref Range   Sodium 138 135 - 145 mmol/L   Potassium 4.5 3.5 - 5.1 mmol/L   Chloride 92 (L) 98 - 111 mmol/L   CO2 36 (H) 22 - 32 mmol/L   Glucose, Bld 127 (H) 70 - 99 mg/dL    Comment: Glucose reference range applies only to samples taken after fasting for at least 8 hours.   BUN 25 (H) 8 - 23 mg/dL   Creatinine, Ser 0.84 0.61 - 1.24 mg/dL   Calcium 9.1 8.9 - 10.3 mg/dL   GFR, Estimated >60 >60 mL/min    Comment: (NOTE) Calculated using the CKD-EPI Creatinine Equation (2021)    Anion gap 10 5 - 15    Comment: Performed at [facility] [address], [place], [place] [address]  Magnesium     Status: None   Collection Time: 01/09/22  4:19 AM  Result Value Ref Range   Magnesium 2.4 1.7 - 2.4 mg/dL    Comment: Performed at [facility] [address], [place], [place] [address]  Phosphorus     Status: Abnormal   Collection Time: 01/09/22  4:19 AM  Result Value Ref Range   Phosphorus 5.3 (H) 2.5 - 4.6 mg/dL    Comment: Performed at [facility] [address], [place], [place] [address]   No results found.  Review Of Systems Constitutional: No fever, chills, weight loss or gain. Eyes: No vision change, wears No discharge or pain. Ears: No hearing loss, No tinnitus. Respiratory: No asthma, Positive COPD,  pneumonias, shortness of breath. No hemoptysis. Cardiovascular: No chest pain, positive palpitation, no leg edema. Gastrointestinal: No nausea, vomiting, diarrhea, constipation. No GI bleed. No hepatitis. Genitourinary: No dysuria,  hematuria, kidney stone. No incontinance. Neurological: No headache, stroke, seizures.  Psychiatry: No psych facility admission for anxiety, depression, suicide. No detox. Skin: No rash. Musculoskeletal: Positive joint pain, no fibromyalgia. No neck pain, back pain. Lymphadenopathy: No lymphadenopathy. Hematology: Positive anemia, no easy bruising.   Blood pressure (!) 136/92, pulse 85, resp. rate (!) 30, SpO2 94 %. There is no height or weight on file to calculate BMI. General appearance: alert, cooperative, appears stated age and moderate respiratory distress Head: Normocephalic, atraumatic. Eyes: Blue eyes, pale conjunctiva, corneas clear. PERRL, EOM's intact. Neck: No adenopathy, no carotid bruit, no JVD, supple, symmetrical, trachea midline and thyroid not enlarged. Resp: Clearing to auscultation bilaterally. Cardio: Regular rate and rhythm, S1, S2 normal, II/VI systolic murmur, no click, rub or gallop GI: Soft, non-tender; bowel sounds normal; no organomegaly. Extremities: No edema, cyanosis or clubbing. Skin: Warm and dry.  Neurologic: Alert and oriented X 1, normal strength.   Assessment/Plan Acute on chronic respiratory failure with hypoxia COPD GERD HTN HLD Episodes of sinus tachycardia with RBBB  Plan: Continue medical treatment. Low dose metoprolol as needed for heart rate control.  Time spent: Review of old records, Lab, x-rays, EKG, other cardiac tests, examination, discussion with patient/Nurse/PA over 70 minutes.  Birdie Riddle, MD  01/09/2022, 5:19 PM

## 2022-01-10 ENCOUNTER — Other Ambulatory Visit (HOSPITAL_COMMUNITY): Payer: Self-pay

## 2022-01-10 DIAGNOSIS — E43 Unspecified severe protein-calorie malnutrition: Secondary | ICD-10-CM | POA: Diagnosis not present

## 2022-01-10 DIAGNOSIS — J189 Pneumonia, unspecified organism: Secondary | ICD-10-CM | POA: Diagnosis not present

## 2022-01-10 DIAGNOSIS — J9621 Acute and chronic respiratory failure with hypoxia: Secondary | ICD-10-CM | POA: Diagnosis not present

## 2022-01-10 DIAGNOSIS — A419 Sepsis, unspecified organism: Secondary | ICD-10-CM | POA: Diagnosis not present

## 2022-01-10 DIAGNOSIS — J449 Chronic obstructive pulmonary disease, unspecified: Secondary | ICD-10-CM | POA: Diagnosis not present

## 2022-01-10 DIAGNOSIS — D638 Anemia in other chronic diseases classified elsewhere: Secondary | ICD-10-CM | POA: Diagnosis not present

## 2022-01-10 DIAGNOSIS — U071 COVID-19: Secondary | ICD-10-CM | POA: Diagnosis not present

## 2022-01-10 DIAGNOSIS — R6520 Severe sepsis without septic shock: Secondary | ICD-10-CM | POA: Diagnosis not present

## 2022-01-10 DIAGNOSIS — J9620 Acute and chronic respiratory failure, unspecified whether with hypoxia or hypercapnia: Secondary | ICD-10-CM | POA: Diagnosis not present

## 2022-01-10 NOTE — Progress Notes (Signed)
Pulmonary Critical Care Medicine [place]   PULMONARY CRITICAL CARE SERVICE  PROGRESS NOTE     Isaac [name]  [id number]:[id number]  DOB: 27-Mar-1956   DOA: 11/15/2021  Referring Physician: Satira Sark, MD  HPI: Isaac [name] is a 66 y.o. male being followed for ventilator/airway/oxygen weaning Acute on Chronic Respiratory Failure.  Patient is on T collar was able to do 20 hours yesterday off the ventilator  Medications: Reviewed on Rounds  Physical Exam:  Vitals: Temperature is 96.9 pulse 86 respiratory 27 blood pressure is 144/82 saturations 94%  Ventilator Settings off the ventilator on T-piece FiO2 45%  General: Comfortable at this time Neck: supple Cardiovascular: no malignant arrhythmias Respiratory: Scattered rhonchi expansion equal Skin: no rash seen on limited exam Musculoskeletal: No gross abnormality Psychiatric:unable to assess Neurologic:no involuntary movements         Lab Data:   Basic Metabolic Panel: Recent Labs  Lab 01/05/22 0441 01/07/22 0910 01/09/22 0419  NA 138 141 138  K 4.1 5.0 4.5  CL 93* 93* 92*  CO2 37* 41* 36*  GLUCOSE 131* 159* 127*  BUN 20 20 25*  CREATININE 0.62 0.68 0.84  CALCIUM 9.2 9.1 9.1  MG 2.2 2.4 2.4  PHOS 5.3*  --  5.3*    ABG: No results for input(s): PHART, PCO2ART, PO2ART, HCO3, O2SAT in the last 168 hours.  Liver Function Tests: Recent Labs  Lab 01/05/22 0441  ALBUMIN 3.0*   No results for input(s): LIPASE, AMYLASE in the last 168 hours. No results for input(s): AMMONIA in the last 168 hours.  CBC: Recent Labs  Lab 01/05/22 0441 01/07/22 0910 01/09/22 0419  WBC 4.8 5.3 4.9  HGB 9.2* 9.5* 8.8*  HCT 30.6* 33.2* 30.6*  MCV 82.0 83.2 82.7  PLT 125* 123* 113*    Cardiac Enzymes: No results for input(s): CKTOTAL, CKMB, CKMBINDEX, TROPONINI in the last 168 hours.  BNP (last 3 results) Recent Labs    03/04/21 1555 08/21/21 0504  BNP 93.2 142.9*    ProBNP (last 3  results) No results for input(s): PROBNP in the last 8760 hours.  Radiological Exams: No results found.  Assessment/Plan Active Problems:   COPD, severe (HCC)   Acute on chronic respiratory failure with hypoxia (HCC)   HCAP (healthcare-associated pneumonia)   COVID-19   Severe sepsis (HCC)   Acute on chronic respiratory failure with hypoxia we will continue with the T-piece titrate oxygen as tolerated continue pulmonary toilet and secretion management. Severe COPD medical management we will continue to monitor along closely. Healthcare associated pneumonia treated we will continue to follow. COVID-19 virus infection in recovery Severe sepsis resolved hemodynamics are stable   I have personally seen and evaluated the patient, evaluated laboratory and imaging results, formulated the assessment and plan and placed orders. The Patient requires high complexity decision making with multiple systems involvement.  Rounds were done with the Respiratory Therapy Director and Staff therapists and discussed with nursing staff also.  Allyne Gee, MD [facility] Pulmonary Critical Care Medicine Sleep Medicine

## 2022-01-11 ENCOUNTER — Other Ambulatory Visit (HOSPITAL_COMMUNITY): Payer: BC Managed Care – PPO

## 2022-01-11 DIAGNOSIS — R6520 Severe sepsis without septic shock: Secondary | ICD-10-CM | POA: Diagnosis not present

## 2022-01-11 DIAGNOSIS — J449 Chronic obstructive pulmonary disease, unspecified: Secondary | ICD-10-CM | POA: Diagnosis not present

## 2022-01-11 DIAGNOSIS — R109 Unspecified abdominal pain: Secondary | ICD-10-CM | POA: Diagnosis not present

## 2022-01-11 DIAGNOSIS — R079 Chest pain, unspecified: Secondary | ICD-10-CM | POA: Diagnosis not present

## 2022-01-11 DIAGNOSIS — U071 COVID-19: Secondary | ICD-10-CM | POA: Diagnosis not present

## 2022-01-11 DIAGNOSIS — J189 Pneumonia, unspecified organism: Secondary | ICD-10-CM | POA: Diagnosis not present

## 2022-01-11 DIAGNOSIS — J9621 Acute and chronic respiratory failure with hypoxia: Secondary | ICD-10-CM | POA: Diagnosis not present

## 2022-01-11 DIAGNOSIS — A419 Sepsis, unspecified organism: Secondary | ICD-10-CM | POA: Diagnosis not present

## 2022-01-11 DIAGNOSIS — J90 Pleural effusion, not elsewhere classified: Secondary | ICD-10-CM | POA: Diagnosis not present

## 2022-01-11 LAB — URINALYSIS, ROUTINE W REFLEX MICROSCOPIC
Bilirubin Urine: NEGATIVE
Glucose, UA: NEGATIVE mg/dL
Hgb urine dipstick: NEGATIVE
Ketones, ur: NEGATIVE mg/dL
Leukocytes,Ua: NEGATIVE
Nitrite: NEGATIVE
Protein, ur: NEGATIVE mg/dL
Specific Gravity, Urine: 1.014 (ref 1.005–1.030)
pH: 5 (ref 5.0–8.0)

## 2022-01-11 NOTE — Progress Notes (Signed)
Pulmonary Critical Care Medicine [place]   PULMONARY CRITICAL CARE SERVICE  PROGRESS NOTE     Isaac [name]  WOE:[id number]  DOB: 04-19-1956   DOA: 11/15/2021  Referring Physician: Satira Sark, MD  HPI: Isaac [name] is a 66 y.o. male being followed for ventilator/airway/oxygen weaning Acute on Chronic Respiratory Failure.  Patient is off the ventilator on T collar appears to be doing well.  Medications: Reviewed on Rounds  Physical Exam:  Vitals: Temperature is 97.4 pulse 97 respiratory rate is 25 blood pressure is 135/78 saturations 97%  Ventilator Settings patient is on T collar FiO2 35% will try for 24 hours  General: Comfortable at this time Neck: supple Cardiovascular: no malignant arrhythmias Respiratory: Scattered rhonchi expansion is equal Skin: no rash seen on limited exam Musculoskeletal: No gross abnormality Psychiatric:unable to assess Neurologic:no involuntary movements         Lab Data:   Basic Metabolic Panel: Recent Labs  Lab 01/05/22 0441 01/07/22 0910 01/09/22 0419  NA 138 141 138  K 4.1 5.0 4.5  CL 93* 93* 92*  CO2 37* 41* 36*  GLUCOSE 131* 159* 127*  BUN 20 20 25*  CREATININE 0.62 0.68 0.84  CALCIUM 9.2 9.1 9.1  MG 2.2 2.4 2.4  PHOS 5.3*  --  5.3*    ABG: No results for input(s): PHART, PCO2ART, PO2ART, HCO3, O2SAT in the last 168 hours.  Liver Function Tests: Recent Labs  Lab 01/05/22 0441  ALBUMIN 3.0*   No results for input(s): LIPASE, AMYLASE in the last 168 hours. No results for input(s): AMMONIA in the last 168 hours.  CBC: Recent Labs  Lab 01/05/22 0441 01/07/22 0910 01/09/22 0419  WBC 4.8 5.3 4.9  HGB 9.2* 9.5* 8.8*  HCT 30.6* 33.2* 30.6*  MCV 82.0 83.2 82.7  PLT 125* 123* 113*    Cardiac Enzymes: No results for input(s): CKTOTAL, CKMB, CKMBINDEX, TROPONINI in the last 168 hours.  BNP (last 3 results) Recent Labs    03/04/21 1555 08/21/21 0504  BNP 93.2 142.9*     ProBNP (last 3 results) No results for input(s): PROBNP in the last 8760 hours.  Radiological Exams: No results found.  Assessment/Plan Active Problems:   COPD, severe (HCC)   Acute on chronic respiratory failure with hypoxia (HCC)   HCAP (healthcare-associated pneumonia)   COVID-19   Severe sepsis (HCC)   Acute on chronic respiratory failure hypoxia we will try for 24 hours on the T-piece.  Continue pulmonary toilet secretion management. COVID-19 virus infection in recovery phase Severe sepsis treated resolved hemodynamics are stable Severe COPD medical management nebulizers as needed Healthcare associated pneumonia again treated improving clinically   I have personally seen and evaluated the patient, evaluated laboratory and imaging results, formulated the assessment and plan and placed orders. The Patient requires high complexity decision making with multiple systems involvement.  Rounds were done with the Respiratory Therapy Director and Staff therapists and discussed with nursing staff also.  Allyne Gee, MD [facility] Pulmonary Critical Care Medicine Sleep Medicine

## 2022-01-12 ENCOUNTER — Other Ambulatory Visit (HOSPITAL_COMMUNITY): Payer: BC Managed Care – PPO

## 2022-01-12 DIAGNOSIS — R6520 Severe sepsis without septic shock: Secondary | ICD-10-CM | POA: Diagnosis not present

## 2022-01-12 DIAGNOSIS — A419 Sepsis, unspecified organism: Secondary | ICD-10-CM | POA: Diagnosis not present

## 2022-01-12 DIAGNOSIS — J189 Pneumonia, unspecified organism: Secondary | ICD-10-CM | POA: Diagnosis not present

## 2022-01-12 DIAGNOSIS — J9621 Acute and chronic respiratory failure with hypoxia: Secondary | ICD-10-CM | POA: Diagnosis not present

## 2022-01-12 DIAGNOSIS — J90 Pleural effusion, not elsewhere classified: Secondary | ICD-10-CM | POA: Diagnosis not present

## 2022-01-12 DIAGNOSIS — U071 COVID-19: Secondary | ICD-10-CM | POA: Diagnosis not present

## 2022-01-12 DIAGNOSIS — J449 Chronic obstructive pulmonary disease, unspecified: Secondary | ICD-10-CM | POA: Diagnosis not present

## 2022-01-12 HISTORY — PX: IR THORACENTESIS ASP PLEURAL SPACE W/IMG GUIDE: IMG5380

## 2022-01-12 LAB — BODY FLUID CELL COUNT WITH DIFFERENTIAL
Eos, Fluid: 0 %
Lymphs, Fluid: 27 %
Monocyte-Macrophage-Serous Fluid: 58 % (ref 50–90)
Neutrophil Count, Fluid: 15 % (ref 0–25)
Total Nucleated Cell Count, Fluid: 712 cu mm (ref 0–1000)

## 2022-01-12 LAB — GRAM STAIN

## 2022-01-12 LAB — GLUCOSE, CSF: Glucose, CSF: 135 mg/dL — ABNORMAL HIGH (ref 40–70)

## 2022-01-12 LAB — BASIC METABOLIC PANEL
Anion gap: 8 (ref 5–15)
BUN: 36 mg/dL — ABNORMAL HIGH (ref 8–23)
CO2: 39 mmol/L — ABNORMAL HIGH (ref 22–32)
Calcium: 8.4 mg/dL — ABNORMAL LOW (ref 8.9–10.3)
Chloride: 92 mmol/L — ABNORMAL LOW (ref 98–111)
Creatinine, Ser: 0.85 mg/dL (ref 0.61–1.24)
GFR, Estimated: >60 mL/min (ref >60)
Glucose, Bld: 122 mg/dL — ABNORMAL HIGH (ref 70–99)
Potassium: 4.7 mmol/L (ref 3.5–5.1)
Sodium: 139 mmol/L (ref 135–145)

## 2022-01-12 LAB — MAGNESIUM: Magnesium: 2.7 mg/dL — ABNORMAL HIGH (ref 1.7–2.4)

## 2022-01-12 LAB — CBC
HCT: 27.3 % — ABNORMAL LOW (ref 39.0–52.0)
Hemoglobin: 8.1 g/dL — ABNORMAL LOW (ref 13.0–17.0)
MCH: 24.2 pg — ABNORMAL LOW (ref 26.0–34.0)
MCHC: 29.7 g/dL — ABNORMAL LOW (ref 30.0–36.0)
MCV: 81.5 fL (ref 80.0–100.0)
Platelets: 103 10*3/uL — ABNORMAL LOW (ref 150–400)
RBC: 3.35 MIL/uL — ABNORMAL LOW (ref 4.22–5.81)
RDW: 15.3 % (ref 11.5–15.5)
WBC: 4.7 10*3/uL (ref 4.0–10.5)
nRBC: 0 % (ref 0.0–0.2)

## 2022-01-12 LAB — PROTEIN, CSF: Total  Protein, CSF: >600 mg/dL — ABNORMAL HIGH (ref 15–45)

## 2022-01-12 LAB — PHOSPHORUS: Phosphorus: 3.3 mg/dL (ref 2.5–4.6)

## 2022-01-12 NOTE — Progress Notes (Signed)
Pulmonary Critical Care Medicine [place]   PULMONARY CRITICAL CARE SERVICE  PROGRESS NOTE     RAFIEL MECCA  [id number]  DOB: 1956/08/26   DOA: 11/15/2021  Referring Physician: Satira Sark, MD  HPI: DRESHAWN HENDERSHOTT is a 66 y.o. male being followed for ventilator/airway/oxygen weaning Acute on Chronic Respiratory Failure.  Patient is doing T collar supposed to be doing 20 hours  Medications: Reviewed on Rounds  Physical Exam:  Vitals: Temperature is 96.9 pulse 77 respiratory 16 blood pressure is 98/57 saturations 98%  Ventilator Settings off ventilator on T-piece  General: Comfortable at this time Neck: supple Cardiovascular: no malignant arrhythmias Respiratory: Scattered rhonchi coarse breath sounds Skin: no rash seen on limited exam Musculoskeletal: No gross abnormality Psychiatric:unable to assess Neurologic:no involuntary movements         Lab Data:   Basic Metabolic Panel: Recent Labs  Lab 01/07/22 0910 01/09/22 0419 01/12/22 0521  NA 141 138 139  K 5.0 4.5 4.7  CL 93* 92* 92*  CO2 41* 36* 39*  GLUCOSE 159* 127* 122*  BUN 20 25* 36*  CREATININE 0.68 0.84 0.85  CALCIUM 9.1 9.1 8.4*  MG 2.4 2.4 2.7*  PHOS  --  5.3* 3.3    ABG: No results for input(s): PHART, PCO2ART, PO2ART, HCO3, O2SAT in the last 168 hours.  Liver Function Tests: No results for input(s): AST, ALT, ALKPHOS, BILITOT, PROT, ALBUMIN in the last 168 hours. No results for input(s): LIPASE, AMYLASE in the last 168 hours. No results for input(s): AMMONIA in the last 168 hours.  CBC: Recent Labs  Lab 01/07/22 0910 01/09/22 0419 01/12/22 0521  WBC 5.3 4.9 4.7  HGB 9.5* 8.8* 8.1*  HCT 33.2* 30.6* 27.3*  MCV 83.2 82.7 81.5  PLT 123* 113* 103*    Cardiac Enzymes: No results for input(s): CKTOTAL, CKMB, CKMBINDEX, TROPONINI in the last 168 hours.  BNP (last 3 results) Recent Labs    03/04/21 1555 08/21/21 0504  BNP 93.2 142.9*    ProBNP  (last 3 results) No results for input(s): PROBNP in the last 8760 hours.  Radiological Exams: DG CHEST PORT 1 VIEW  Result Date: 01/11/2022 CLINICAL DATA:  Status post tracheostomy tube placement. Pneumonia. Abdominal pain. EXAM: PORTABLE CHEST 1 VIEW COMPARISON:  12/20/2021 FINDINGS: The tracheostomy tube tip is above the carina by approximately 6.7 cm. Stable cardiomediastinal contours. Large left pleural effusion with veil-like opacification of the left upper and left mid lung. Bilateral lower lung zone opacities are unchanged from the previous exam. IMPRESSION: 1. No change in aeration to the lungs compared with previous exam. 2. Unchanged large left pleural effusion resulting in veil-like opacification of the left lung. Electronically Signed   By: Kerby Moors M.D.   On: 01/11/2022 11:35   DG Abd Portable 1V  Result Date: 01/11/2022 CLINICAL DATA:  Abdominal pain EXAM: PORTABLE ABDOMEN - 1 VIEW COMPARISON:  12/09/2021 FINDINGS: Single supine view of the abdomen and pelvis. Minimal motion degradation. Gastrostomy tube. Non-obstructive bowel gas pattern. The peripheral portions of the abdomen are excluded. No gross free intraperitoneal air. Low pelvis excluded. IMPRESSION: No acute findings.  Partial exclusion of portions of the abdomen. Electronically Signed   By: Abigail Miyamoto M.D.   On: 01/11/2022 11:32    Assessment/Plan Active Problems:   COPD, severe (HCC)   Acute on chronic respiratory failure with hypoxia (HCC)   HCAP (healthcare-associated pneumonia)   COVID-19   Severe sepsis (HCC)   Acute on chronic respiratory  failure hypoxia plan is to continue with the wean on the T-piece as tolerated.  Continue secretion management pulmonary toilet. Severe COPD medical management we will continue to monitor closely Healthcare associated pneumonia has been treated COVID-19 in recovery Severe sepsis resolved   I have personally seen and evaluated the patient, evaluated laboratory and  imaging results, formulated the assessment and plan and placed orders. The Patient requires high complexity decision making with multiple systems involvement.  Rounds were done with the Respiratory Therapy Director and Staff therapists and discussed with nursing staff also.  Allyne Gee, MD [facility] Pulmonary Critical Care Medicine Sleep Medicine

## 2022-01-12 NOTE — Procedures (Signed)
PROCEDURE SUMMARY:  Successful US guided diagnostic and therapeutic left thoracentesis. Yielded 500 cc of serosanguinous fluid. Pt tolerated procedure well. No immediate complications.  Specimen was sent for labs. CXR ordered.  EBL < 1 mL  Tyson Alias, AGNP 01/12/2022 4:38 PM

## 2022-01-13 DIAGNOSIS — A419 Sepsis, unspecified organism: Secondary | ICD-10-CM | POA: Diagnosis not present

## 2022-01-13 DIAGNOSIS — J449 Chronic obstructive pulmonary disease, unspecified: Secondary | ICD-10-CM | POA: Diagnosis not present

## 2022-01-13 DIAGNOSIS — J189 Pneumonia, unspecified organism: Secondary | ICD-10-CM | POA: Diagnosis not present

## 2022-01-13 DIAGNOSIS — U071 COVID-19: Secondary | ICD-10-CM | POA: Diagnosis not present

## 2022-01-13 DIAGNOSIS — R6520 Severe sepsis without septic shock: Secondary | ICD-10-CM | POA: Diagnosis not present

## 2022-01-13 DIAGNOSIS — J9621 Acute and chronic respiratory failure with hypoxia: Secondary | ICD-10-CM | POA: Diagnosis not present

## 2022-01-13 LAB — CULTURE, RESPIRATORY W GRAM STAIN

## 2022-01-13 LAB — URINE CULTURE: Culture: 70000 — AB

## 2022-01-13 NOTE — Progress Notes (Signed)
Pulmonary Critical Care Medicine [place]   PULMONARY CRITICAL CARE SERVICE  PROGRESS NOTE     Isaac [name]  ALP:[id number]  DOB: May 11, 1956   DOA: 11/15/2021  Referring Physician: Satira Sark, MD  HPI: Isaac [name] is a 66 y.o. male being followed for ventilator/airway/oxygen weaning Acute on Chronic Respiratory Failure.  Patient is weaning on T-piece has been able to only tolerate about 16 hours yesterday  Medications: Reviewed on Rounds  Physical Exam:  Vitals: Temperature is 97.2 pulse 95 respiratory is 26 blood pressure is 129/74 saturations 97%  Ventilator Settings on T collar  General: Comfortable at this time Neck: supple Cardiovascular: no malignant arrhythmias Respiratory: No rhonchi no rales are noted at this time Skin: no rash seen on limited exam Musculoskeletal: No gross abnormality Psychiatric:unable to assess Neurologic:no involuntary movements         Lab Data:   Basic Metabolic Panel: Recent Labs  Lab 01/07/22 0910 01/09/22 0419 01/12/22 0521  NA 141 138 139  K 5.0 4.5 4.7  CL 93* 92* 92*  CO2 41* 36* 39*  GLUCOSE 159* 127* 122*  BUN 20 25* 36*  CREATININE 0.68 0.84 0.85  CALCIUM 9.1 9.1 8.4*  MG 2.4 2.4 2.7*  PHOS  --  5.3* 3.3    ABG: No results for input(s): PHART, PCO2ART, PO2ART, HCO3, O2SAT in the last 168 hours.  Liver Function Tests: No results for input(s): AST, ALT, ALKPHOS, BILITOT, PROT, ALBUMIN in the last 168 hours. No results for input(s): LIPASE, AMYLASE in the last 168 hours. No results for input(s): AMMONIA in the last 168 hours.  CBC: Recent Labs  Lab 01/07/22 0910 01/09/22 0419 01/12/22 0521  WBC 5.3 4.9 4.7  HGB 9.5* 8.8* 8.1*  HCT 33.2* 30.6* 27.3*  MCV 83.2 82.7 81.5  PLT 123* 113* 103*    Cardiac Enzymes: No results for input(s): CKTOTAL, CKMB, CKMBINDEX, TROPONINI in the last 168 hours.  BNP (last 3 results) Recent Labs    03/04/21 1555 08/21/21 0504  BNP  93.2 142.9*    ProBNP (last 3 results) No results for input(s): PROBNP in the last 8760 hours.  Radiological Exams: DG Chest Port 1 View  Result Date: 01/12/2022 CLINICAL DATA:  Thoracentesis EXAM: PORTABLE CHEST 1 VIEW COMPARISON:  01/11/2022 FINDINGS: Single frontal view of the chest demonstrates tracheostomy tube unchanged. Cardiac silhouette is stable. Decreased left pleural effusion after interval thoracentesis. No evidence of pneumothorax. Stable bibasilar consolidation, left greater than right. No acute bony abnormalities. IMPRESSION: 1. No complication after left thoracentesis. Small residual left pleural effusion. 2. Persistent bibasilar consolidation, unchanged. Electronically Signed   By: Randa Ngo M.D.   On: 01/12/2022 17:22   DG CHEST PORT 1 VIEW  Result Date: 01/11/2022 CLINICAL DATA:  Status post tracheostomy tube placement. Pneumonia. Abdominal pain. EXAM: PORTABLE CHEST 1 VIEW COMPARISON:  12/20/2021 FINDINGS: The tracheostomy tube tip is above the carina by approximately 6.7 cm. Stable cardiomediastinal contours. Large left pleural effusion with veil-like opacification of the left upper and left mid lung. Bilateral lower lung zone opacities are unchanged from the previous exam. IMPRESSION: 1. No change in aeration to the lungs compared with previous exam. 2. Unchanged large left pleural effusion resulting in veil-like opacification of the left lung. Electronically Signed   By: Kerby Moors M.D.   On: 01/11/2022 11:35   DG Abd Portable 1V  Result Date: 01/11/2022 CLINICAL DATA:  Abdominal pain EXAM: PORTABLE ABDOMEN - 1 VIEW COMPARISON:  12/09/2021 FINDINGS:  Single supine view of the abdomen and pelvis. Minimal motion degradation. Gastrostomy tube. Non-obstructive bowel gas pattern. The peripheral portions of the abdomen are excluded. No gross free intraperitoneal air. Low pelvis excluded. IMPRESSION: No acute findings.  Partial exclusion of portions of the abdomen.  Electronically Signed   By: Abigail Miyamoto M.D.   On: 01/11/2022 11:32   IR THORACENTESIS ASP PLEURAL SPACE W/IMG GUIDE  Result Date: 01/12/2022 INDICATION: [facility]. Request for diagnostic and therapeutic left thoracentesis. EXAM: ULTRASOUND GUIDED DIAGNOSTIC AND THERAPEUTIC LEFT THORACENTESIS MEDICATIONS: 10 mL 1 % lidocaine COMPLICATIONS: None immediate. PROCEDURE: An ultrasound guided thoracentesis was thoroughly discussed with the patient and questions answered. The benefits, risks, alternatives and complications were also discussed. The patient understands and wishes to proceed with the procedure. Written consent was obtained. Ultrasound was performed to localize and mark an adequate pocket of fluid in the left chest. The area was then prepped and draped in the normal sterile fashion. 1% Lidocaine was used for local anesthesia. Under ultrasound guidance a 6 Fr Safe-T-Centesis catheter was introduced. Thoracentesis was performed. The catheter was removed and a dressing applied. FINDINGS: A total of approximately 500 cc of serosanguineous fluid was removed. Samples were sent to the laboratory as requested by the clinical team. IMPRESSION: Successful ultrasound guided left thoracentesis yielding 500 cc of pleural fluid. Read by: Narda Rutherford, AGNP-BC Electronically Signed   By: Lucrezia Europe M.D.   On: 01/12/2022 16:44    Assessment/Plan Active Problems:   COPD, severe (HCC)   Acute on chronic respiratory failure with hypoxia (HCC)   HCAP (healthcare-associated pneumonia)   COVID-19   Severe sepsis (HCC)   Acute on chronic respiratory failure hypoxia we will continue with T-piece try to advance to 20 hours if patient is able to tolerate. Severe COPD medical management we will continue to follow along closely. Pleural effusion status postthoracentesis had about 500 cc removed Healthcare associated pneumonia has been treated  we will continue to monitor along closely. COVID-19 virus infection in recovery phase Severe sepsis treated resolved   I have personally seen and evaluated the patient, evaluated laboratory and imaging results, formulated the assessment and plan and placed orders. The Patient requires high complexity decision making with multiple systems involvement.  Rounds were done with the Respiratory Therapy Director and Staff therapists and discussed with nursing staff also.  Allyne Gee, MD [facility] Pulmonary Critical Care Medicine Sleep Medicine

## 2022-01-14 LAB — BASIC METABOLIC PANEL
Anion gap: 11 (ref 5–15)
BUN: 30 mg/dL — ABNORMAL HIGH (ref 8–23)
CO2: 36 mmol/L — ABNORMAL HIGH (ref 22–32)
Calcium: 8.4 mg/dL — ABNORMAL LOW (ref 8.9–10.3)
Chloride: 94 mmol/L — ABNORMAL LOW (ref 98–111)
Creatinine, Ser: 0.71 mg/dL (ref 0.61–1.24)
GFR, Estimated: >60 mL/min (ref >60)
Glucose, Bld: 115 mg/dL — ABNORMAL HIGH (ref 70–99)
Potassium: 4.3 mmol/L (ref 3.5–5.1)
Sodium: 141 mmol/L (ref 135–145)

## 2022-01-14 LAB — CBC
HCT: 27.9 % — ABNORMAL LOW (ref 39.0–52.0)
Hemoglobin: 8.1 g/dL — ABNORMAL LOW (ref 13.0–17.0)
MCH: 23.5 pg — ABNORMAL LOW (ref 26.0–34.0)
MCHC: 29 g/dL — ABNORMAL LOW (ref 30.0–36.0)
MCV: 81.1 fL (ref 80.0–100.0)
Platelets: 106 10*3/uL — ABNORMAL LOW (ref 150–400)
RBC: 3.44 MIL/uL — ABNORMAL LOW (ref 4.22–5.81)
RDW: 15.6 % — ABNORMAL HIGH (ref 11.5–15.5)
WBC: 3.9 10*3/uL — ABNORMAL LOW (ref 4.0–10.5)
nRBC: 0 % (ref 0.0–0.2)

## 2022-01-14 LAB — MAGNESIUM: Magnesium: 2.4 mg/dL (ref 1.7–2.4)

## 2022-01-14 LAB — PHOSPHORUS: Phosphorus: 4.1 mg/dL (ref 2.5–4.6)

## 2022-01-15 ENCOUNTER — Telehealth: Payer: Self-pay

## 2022-01-15 DIAGNOSIS — J9621 Acute and chronic respiratory failure with hypoxia: Secondary | ICD-10-CM | POA: Diagnosis not present

## 2022-01-15 DIAGNOSIS — J189 Pneumonia, unspecified organism: Secondary | ICD-10-CM | POA: Diagnosis not present

## 2022-01-15 DIAGNOSIS — R6520 Severe sepsis without septic shock: Secondary | ICD-10-CM | POA: Diagnosis not present

## 2022-01-15 DIAGNOSIS — J449 Chronic obstructive pulmonary disease, unspecified: Secondary | ICD-10-CM | POA: Diagnosis not present

## 2022-01-15 DIAGNOSIS — U071 COVID-19: Secondary | ICD-10-CM | POA: Diagnosis not present

## 2022-01-15 DIAGNOSIS — A419 Sepsis, unspecified organism: Secondary | ICD-10-CM | POA: Diagnosis not present

## 2022-01-15 LAB — CYTOLOGY - NON PAP

## 2022-01-15 NOTE — Progress Notes (Signed)
Pulmonary Critical Care Medicine [place]   PULMONARY CRITICAL CARE SERVICE  PROGRESS NOTE     [name]  OTL:[id number]  DOB: [birth year]   DOA: 11/15/2021  Referring Physician: Satira Sark, MD  HPI: Isaac [name] is a 66 y.o. male being followed for ventilator/airway/oxygen weaning Acute on Chronic Respiratory Failure.  Patient is afebrile comfortable without distress has been on assist control mode on 35% FiO2 with good saturations are noted  Medications: Reviewed on Rounds  Physical Exam:  Vitals: Temperature is 97.4 pulse 93 respiratory is 29 blood pressure is 146/84 saturations 95%  Ventilator Settings assist control FiO2 35% on 9580 PEEP 5  General: Comfortable at this time Neck: supple Cardiovascular: no malignant arrhythmias Respiratory: Scattered rhonchi very coarse breath sounds Skin: no rash seen on limited exam Musculoskeletal: No gross abnormality Psychiatric:unable to assess Neurologic:no involuntary movements         Lab Data:   Basic Metabolic Panel: Recent Labs  Lab 01/09/22 0419 01/12/22 0521 01/14/22 0701  NA 138 139 141  K 4.5 4.7 4.3  CL 92* 92* 94*  CO2 36* 39* 36*  GLUCOSE 127* 122* 115*  BUN 25* 36* 30*  CREATININE 0.84 0.85 0.71  CALCIUM 9.1 8.4* 8.4*  MG 2.4 2.7* 2.4  PHOS 5.3* 3.3 4.1    ABG: No results for input(s): PHART, PCO2ART, PO2ART, HCO3, O2SAT in the last 168 hours.  Liver Function Tests: No results for input(s): AST, ALT, ALKPHOS, BILITOT, PROT, ALBUMIN in the last 168 hours. No results for input(s): LIPASE, AMYLASE in the last 168 hours. No results for input(s): AMMONIA in the last 168 hours.  CBC: Recent Labs  Lab 01/09/22 0419 01/12/22 0521 01/14/22 0701  WBC 4.9 4.7 3.9*  HGB 8.8* 8.1* 8.1*  HCT 30.6* 27.3* 27.9*  MCV 82.7 81.5 81.1  PLT 113* 103* 106*    Cardiac Enzymes: No results for input(s): CKTOTAL, CKMB, CKMBINDEX, TROPONINI in the last 168 hours.  BNP  (last 3 results) Recent Labs    03/04/21 1555 08/21/21 0504  BNP 93.2 142.9*    ProBNP (last 3 results) No results for input(s): PROBNP in the last 8760 hours.  Radiological Exams: No results found.  Assessment/Plan Active Problems:   COPD, severe (HCC)   Acute on chronic respiratory failure with hypoxia (HCC)   HCAP (healthcare-associated pneumonia)   COVID-19   Severe sepsis (HCC)   Acute on chronic respiratory failure hypoxia we will continue with assist control mode patient is doing okay with 35% FiO2 Severe COPD medical management we will continue to follow along closely COVID-19 virus infection in recovery phase Healthcare associated pneumonia has been treated with antibiotics Severe sepsis treated resolved hemodynamics are stable   I have personally seen and evaluated the patient, evaluated laboratory and imaging results, formulated the assessment and plan and placed orders. The Patient requires high complexity decision making with multiple systems involvement.  Rounds were done with the Respiratory Therapy Director and Staff therapists and discussed with nursing staff also.  Allyne Gee, MD [facility] Pulmonary Critical Care Medicine Sleep Medicine

## 2022-01-15 NOTE — Telephone Encounter (Signed)
Pt's wife called to leave message for Dr. Alen Blew. Patient has been at [facility] and will be transferred to a hospital in [facility] tentatively on 12/19/21. He has appointment scheduled for 01/26/22. Mrs. Taaffe will call to update Randall if he moves.

## 2022-01-16 DIAGNOSIS — U071 COVID-19: Secondary | ICD-10-CM | POA: Diagnosis not present

## 2022-01-16 DIAGNOSIS — A419 Sepsis, unspecified organism: Secondary | ICD-10-CM | POA: Diagnosis not present

## 2022-01-16 DIAGNOSIS — R6520 Severe sepsis without septic shock: Secondary | ICD-10-CM | POA: Diagnosis not present

## 2022-01-16 DIAGNOSIS — J449 Chronic obstructive pulmonary disease, unspecified: Secondary | ICD-10-CM | POA: Diagnosis not present

## 2022-01-16 DIAGNOSIS — J9621 Acute and chronic respiratory failure with hypoxia: Secondary | ICD-10-CM | POA: Diagnosis not present

## 2022-01-16 DIAGNOSIS — J189 Pneumonia, unspecified organism: Secondary | ICD-10-CM | POA: Diagnosis not present

## 2022-01-16 NOTE — Progress Notes (Signed)
Pulmonary Critical Care Medicine [place]   PULMONARY CRITICAL CARE SERVICE  PROGRESS NOTE     [name]  TIW:[id number]  DOB: 07-Aug-1956   DOA: 11/15/2021  Referring Physician: Satira Sark, MD  HPI: Isaac [name] is a 66 y.o. male being followed for ventilator/airway/oxygen weaning Acute on Chronic Respiratory Failure.  Patient is on T collar on 35% FiO2 will be completing 24 hours  Medications: Reviewed on Rounds  Physical Exam:  Vitals: Temperature is 97.7 pulse 78 respiratory is 21 blood pressure is 134/78 saturations 97%  Ventilator Settings on T-piece FiO2 35%  General: Comfortable at this time Neck: supple Cardiovascular: no malignant arrhythmias Respiratory: No rhonchi very coarse breath sound Skin: no rash seen on limited exam Musculoskeletal: No gross abnormality Psychiatric:unable to assess Neurologic:no involuntary movements         Lab Data:   Basic Metabolic Panel: Recent Labs  Lab 01/12/22 0521 01/14/22 0701  NA 139 141  K 4.7 4.3  CL 92* 94*  CO2 39* 36*  GLUCOSE 122* 115*  BUN 36* 30*  CREATININE 0.85 0.71  CALCIUM 8.4* 8.4*  MG 2.7* 2.4  PHOS 3.3 4.1    ABG: No results for input(s): PHART, PCO2ART, PO2ART, HCO3, O2SAT in the last 168 hours.  Liver Function Tests: No results for input(s): AST, ALT, ALKPHOS, BILITOT, PROT, ALBUMIN in the last 168 hours. No results for input(s): LIPASE, AMYLASE in the last 168 hours. No results for input(s): AMMONIA in the last 168 hours.  CBC: Recent Labs  Lab 01/12/22 0521 01/14/22 0701  WBC 4.7 3.9*  HGB 8.1* 8.1*  HCT 27.3* 27.9*  MCV 81.5 81.1  PLT 103* 106*    Cardiac Enzymes: No results for input(s): CKTOTAL, CKMB, CKMBINDEX, TROPONINI in the last 168 hours.  BNP (last 3 results) Recent Labs    03/04/21 1555 08/21/21 0504  BNP 93.2 142.9*    ProBNP (last 3 results) No results for input(s): PROBNP in the last 8760 hours.  Radiological  Exams: No results found.  Assessment/Plan Active Problems:   COPD, severe (HCC)   Acute on chronic respiratory failure with hypoxia (HCC)   HCAP (healthcare-associated pneumonia)   COVID-19   Severe sepsis (HCC)   Acute on chronic respiratory failure with hypoxia plan is to continue with the wean goal of 24 hours Severe COPD medical management we will continue to follow along Healthcare associated pneumonia treated we will continue to monitor along closely COVID-19 virus infection in recovery Severe sepsis resolved   I have personally seen and evaluated the patient, evaluated laboratory and imaging results, formulated the assessment and plan and placed orders. The Patient requires high complexity decision making with multiple systems involvement.  Rounds were done with the Respiratory Therapy Director and Staff therapists and discussed with nursing staff also.  Allyne Gee, MD [facility] Pulmonary Critical Care Medicine Sleep Medicine

## 2022-01-17 LAB — CBC
HCT: 31.1 % — ABNORMAL LOW (ref 39.0–52.0)
Hemoglobin: 8.8 g/dL — ABNORMAL LOW (ref 13.0–17.0)
MCH: 23.5 pg — ABNORMAL LOW (ref 26.0–34.0)
MCHC: 28.3 g/dL — ABNORMAL LOW (ref 30.0–36.0)
MCV: 83.2 fL (ref 80.0–100.0)
Platelets: 100 10*3/uL — ABNORMAL LOW (ref 150–400)
RBC: 3.74 MIL/uL — ABNORMAL LOW (ref 4.22–5.81)
RDW: 15.6 % — ABNORMAL HIGH (ref 11.5–15.5)
WBC: 3.4 10*3/uL — ABNORMAL LOW (ref 4.0–10.5)
nRBC: 0 % (ref 0.0–0.2)

## 2022-01-17 LAB — RENAL FUNCTION PANEL
Albumin: 3 g/dL — ABNORMAL LOW (ref 3.5–5.0)
Anion gap: 7 (ref 5–15)
BUN: 27 mg/dL — ABNORMAL HIGH (ref 8–23)
CO2: 39 mmol/L — ABNORMAL HIGH (ref 22–32)
Calcium: 8.6 mg/dL — ABNORMAL LOW (ref 8.9–10.3)
Chloride: 95 mmol/L — ABNORMAL LOW (ref 98–111)
Creatinine, Ser: 0.73 mg/dL (ref 0.61–1.24)
GFR, Estimated: >60 mL/min (ref >60)
Glucose, Bld: 139 mg/dL — ABNORMAL HIGH (ref 70–99)
Phosphorus: 4.2 mg/dL (ref 2.5–4.6)
Potassium: 4.1 mmol/L (ref 3.5–5.1)
Sodium: 141 mmol/L (ref 135–145)

## 2022-01-17 LAB — CULTURE, BODY FLUID W GRAM STAIN -BOTTLE: Culture: NO GROWTH

## 2022-01-17 LAB — BLOOD GAS, ARTERIAL
Acid-Base Excess: 14.5 mmol/L — ABNORMAL HIGH (ref 0.0–2.0)
Bicarbonate: 42.5 mmol/L — ABNORMAL HIGH (ref 20.0–28.0)
FIO2: 35 %
O2 Saturation: 97.2 %
Patient temperature: 36.4
pCO2 arterial: 65 mmHg — ABNORMAL HIGH (ref 32–48)
pH, Arterial: 7.42 (ref 7.35–7.45)
pO2, Arterial: 151 mmHg — ABNORMAL HIGH (ref 83–108)

## 2022-01-17 LAB — MAGNESIUM: Magnesium: 2.4 mg/dL (ref 1.7–2.4)

## 2022-01-17 NOTE — Progress Notes (Signed)
Pulmonary Critical Care Medicine [place]   PULMONARY CRITICAL CARE SERVICE  PROGRESS NOTE     FERMIN YAN  LKJ:[id number]  DOB: 1956-11-24   DOA: 11/15/2021  Referring Physician: Satira Sark, MD  HPI: ISRRAEL FLUCKIGER is a 66 y.o. male being followed for ventilator/airway/oxygen weaning Acute on Chronic Respiratory Failure.  Patient has completed 24 hours of weaning on the T-piece currently is on 28% FiO2  Medications: Reviewed on Rounds  Physical Exam:  Vitals: Temperature is 97.3 pulse 82 respiratory 29 blood pressure is 147/86 saturations 98%  Ventilator Settings on T collar FiO2 is 28%  General: Comfortable at this time Neck: supple Cardiovascular: no malignant arrhythmias Respiratory: Scattered rhonchi expansion is equal Skin: no rash seen on limited exam Musculoskeletal: No gross abnormality Psychiatric:unable to assess Neurologic:no involuntary movements         Lab Data:   Basic Metabolic Panel: Recent Labs  Lab 01/12/22 0521 01/14/22 0701 01/17/22 0431  NA 139 141 141  K 4.7 4.3 4.1  CL 92* 94* 95*  CO2 39* 36* 39*  GLUCOSE 122* 115* 139*  BUN 36* 30* 27*  CREATININE 0.85 0.71 0.73  CALCIUM 8.4* 8.4* 8.6*  MG 2.7* 2.4 2.4  PHOS 3.3 4.1 4.2    ABG: No results for input(s): PHART, PCO2ART, PO2ART, HCO3, O2SAT in the last 168 hours.  Liver Function Tests: Recent Labs  Lab 01/17/22 0431  ALBUMIN 3.0*   No results for input(s): LIPASE, AMYLASE in the last 168 hours. No results for input(s): AMMONIA in the last 168 hours.  CBC: Recent Labs  Lab 01/12/22 0521 01/14/22 0701 01/17/22 0431  WBC 4.7 3.9* 3.4*  HGB 8.1* 8.1* 8.8*  HCT 27.3* 27.9* 31.1*  MCV 81.5 81.1 83.2  PLT 103* 106* 100*    Cardiac Enzymes: No results for input(s): CKTOTAL, CKMB, CKMBINDEX, TROPONINI in the last 168 hours.  BNP (last 3 results) Recent Labs    03/04/21 1555 08/21/21 0504  BNP 93.2 142.9*    ProBNP (last 3  results) No results for input(s): PROBNP in the last 8760 hours.  Radiological Exams: No results found.  Assessment/Plan Active Problems:   COPD, severe (HCC)   Acute on chronic respiratory failure with hypoxia (HCC)   HCAP (healthcare-associated pneumonia)   COVID-19   Severe sepsis (HCC)   Acute on chronic respiratory failure hypoxia plan is going to be to continue with T collar patient is going to be on 28% FiO2 continue to monitor closely. Severe COPD medical management we will continue to monitor Healthcare associated pneumonia has been treated with antibiotics COVID-19 virus infection in recovery phase we will continue to follow along closely Severe sepsis treated resolved hemodynamics are stable   I have personally seen and evaluated the patient, evaluated laboratory and imaging results, formulated the assessment and plan and placed orders. The Patient requires high complexity decision making with multiple systems involvement.  Rounds were done with the Respiratory Therapy Director and Staff therapists and discussed with nursing staff also.  Allyne Gee, MD [facility] Pulmonary Critical Care Medicine Sleep Medicine

## 2022-01-18 ENCOUNTER — Other Ambulatory Visit (HOSPITAL_COMMUNITY): Payer: Self-pay

## 2022-01-18 LAB — RESP PANEL BY RT-PCR (FLU A&B, COVID) ARPGX2
Influenza A by PCR: NEGATIVE
Influenza B by PCR: NEGATIVE
SARS Coronavirus 2 by RT PCR: NEGATIVE

## 2022-01-18 NOTE — Progress Notes (Signed)
Pulmonary Critical Care Medicine [place]   PULMONARY CRITICAL CARE SERVICE  PROGRESS NOTE     Isaac [name]  OQH:[id number]  DOB: 1956/03/23   DOA: 11/15/2021  Referring Physician: Satira Sark, MD  HPI: Isaac [name] is a 66 y.o. male being followed for ventilator/airway/oxygen weaning Acute on Chronic Respiratory Failure.  Patient was placed back on the ventilator after approximately going for 48 hours but not quite completing 48 hours.  The patient failed and now is on pressure support on 35% FiO2  Medications: Reviewed on Rounds  Physical Exam:  Vitals: Temperature is 98.0 pulse 94 respiratory is 26 blood pressure 137/76 saturations 97%  Ventilator Settings on pressure support FiO2 35% pressure 12/5 tidal volume was 450  General: Comfortable at this time Neck: supple Cardiovascular: no malignant arrhythmias Respiratory: No rhonchi very coarse breath sounds Skin: no rash seen on limited exam Musculoskeletal: No gross abnormality Psychiatric:unable to assess Neurologic:no involuntary movements         Lab Data:   Basic Metabolic Panel: Recent Labs  Lab 01/12/22 0521 01/14/22 0701 01/17/22 0431  NA 139 141 141  K 4.7 4.3 4.1  CL 92* 94* 95*  CO2 39* 36* 39*  GLUCOSE 122* 115* 139*  BUN 36* 30* 27*  CREATININE 0.85 0.71 0.73  CALCIUM 8.4* 8.4* 8.6*  MG 2.7* 2.4 2.4  PHOS 3.3 4.1 4.2    ABG: Recent Labs  Lab 01/17/22 1343  PHART 7.42  PCO2ART 65*  PO2ART 151*  HCO3 42.5*  O2SAT 97.2    Liver Function Tests: Recent Labs  Lab 01/17/22 0431  ALBUMIN 3.0*   No results for input(s): LIPASE, AMYLASE in the last 168 hours. No results for input(s): AMMONIA in the last 168 hours.  CBC: Recent Labs  Lab 01/12/22 0521 01/14/22 0701 01/17/22 0431  WBC 4.7 3.9* 3.4*  HGB 8.1* 8.1* 8.8*  HCT 27.3* 27.9* 31.1*  MCV 81.5 81.1 83.2  PLT 103* 106* 100*    Cardiac Enzymes: No results for input(s): CKTOTAL, CKMB,  CKMBINDEX, TROPONINI in the last 168 hours.  BNP (last 3 results) Recent Labs    03/04/21 1555 08/21/21 0504  BNP 93.2 142.9*    ProBNP (last 3 results) No results for input(s): PROBNP in the last 8760 hours.  Radiological Exams: No results found.  Assessment/Plan Active Problems:   COPD, severe (HCC)   Acute on chronic respiratory failure with hypoxia (HCC)   HCAP (healthcare-associated pneumonia)   COVID-19   Severe sepsis (HCC)   Acute on chronic respiratory failure with hypoxia it appears the patient is going to need nocturnal vent support and so therefore I am asking for respiratory therapy to place him on resting mode at nighttime and try T collar during the daytime. Severe COPD we will continue with medical management nebulizers as needed Healthcare associated pneumonia has been treated last film done February 17 showed some persistence of basilar consolidation and small pleural effusion after thoracentesis COVID-19 virus infection in recovery Severe sepsis resolved hemodynamics are now stable   I have personally seen and evaluated the patient, evaluated laboratory and imaging results, formulated the assessment and plan and placed orders. The Patient requires high complexity decision making with multiple systems involvement.  Rounds were done with the Respiratory Therapy Director and Staff therapists and discussed with nursing staff also.  Allyne Gee, MD [facility] Pulmonary Critical Care Medicine Sleep Medicine

## 2022-01-19 DIAGNOSIS — L24A9 Irritant contact dermatitis due friction or contact with other specified body fluids: Secondary | ICD-10-CM | POA: Diagnosis not present

## 2022-01-19 DIAGNOSIS — S0121XA Laceration without foreign body of nose, initial encounter: Secondary | ICD-10-CM | POA: Diagnosis not present

## 2022-01-19 DIAGNOSIS — F419 Anxiety disorder, unspecified: Secondary | ICD-10-CM | POA: Diagnosis not present

## 2022-01-19 DIAGNOSIS — U071 COVID-19: Secondary | ICD-10-CM | POA: Diagnosis not present

## 2022-01-19 DIAGNOSIS — D649 Anemia, unspecified: Secondary | ICD-10-CM | POA: Diagnosis not present

## 2022-01-19 DIAGNOSIS — J9620 Acute and chronic respiratory failure, unspecified whether with hypoxia or hypercapnia: Secondary | ICD-10-CM | POA: Diagnosis not present

## 2022-01-19 DIAGNOSIS — E43 Unspecified severe protein-calorie malnutrition: Secondary | ICD-10-CM | POA: Diagnosis not present

## 2022-01-19 DIAGNOSIS — R5383 Other fatigue: Secondary | ICD-10-CM | POA: Diagnosis not present

## 2022-01-19 DIAGNOSIS — S0033XA Contusion of nose, initial encounter: Secondary | ICD-10-CM | POA: Diagnosis not present

## 2022-01-19 DIAGNOSIS — R32 Unspecified urinary incontinence: Secondary | ICD-10-CM | POA: Diagnosis not present

## 2022-01-19 DIAGNOSIS — T1490XA Injury, unspecified, initial encounter: Secondary | ICD-10-CM | POA: Diagnosis not present

## 2022-01-19 DIAGNOSIS — Z23 Encounter for immunization: Secondary | ICD-10-CM | POA: Diagnosis not present

## 2022-01-19 DIAGNOSIS — L89151 Pressure ulcer of sacral region, stage 1: Secondary | ICD-10-CM | POA: Diagnosis not present

## 2022-01-19 DIAGNOSIS — B379 Candidiasis, unspecified: Secondary | ICD-10-CM | POA: Diagnosis not present

## 2022-01-19 DIAGNOSIS — Z9989 Dependence on other enabling machines and devices: Secondary | ICD-10-CM | POA: Diagnosis not present

## 2022-01-19 DIAGNOSIS — R278 Other lack of coordination: Secondary | ICD-10-CM | POA: Diagnosis not present

## 2022-01-19 DIAGNOSIS — J9611 Chronic respiratory failure with hypoxia: Secondary | ICD-10-CM | POA: Diagnosis not present

## 2022-01-19 DIAGNOSIS — A419 Sepsis, unspecified organism: Secondary | ICD-10-CM | POA: Diagnosis not present

## 2022-01-19 DIAGNOSIS — Z743 Need for continuous supervision: Secondary | ICD-10-CM | POA: Diagnosis not present

## 2022-01-19 DIAGNOSIS — R0981 Nasal congestion: Secondary | ICD-10-CM | POA: Diagnosis not present

## 2022-01-19 DIAGNOSIS — C9111 Chronic lymphocytic leukemia of B-cell type in remission: Secondary | ICD-10-CM | POA: Diagnosis not present

## 2022-01-19 DIAGNOSIS — N390 Urinary tract infection, site not specified: Secondary | ICD-10-CM | POA: Diagnosis not present

## 2022-01-19 DIAGNOSIS — M6250 Muscle wasting and atrophy, not elsewhere classified, unspecified site: Secondary | ICD-10-CM | POA: Diagnosis not present

## 2022-01-19 DIAGNOSIS — Z8616 Personal history of COVID-19: Secondary | ICD-10-CM | POA: Diagnosis not present

## 2022-01-19 DIAGNOSIS — W06XXXA Fall from bed, initial encounter: Secondary | ICD-10-CM | POA: Diagnosis not present

## 2022-01-19 DIAGNOSIS — B999 Unspecified infectious disease: Secondary | ICD-10-CM | POA: Diagnosis not present

## 2022-01-19 DIAGNOSIS — Y9389 Activity, other specified: Secondary | ICD-10-CM | POA: Diagnosis not present

## 2022-01-19 DIAGNOSIS — D696 Thrombocytopenia, unspecified: Secondary | ICD-10-CM | POA: Diagnosis not present

## 2022-01-19 DIAGNOSIS — R52 Pain, unspecified: Secondary | ICD-10-CM | POA: Diagnosis not present

## 2022-01-19 DIAGNOSIS — Z931 Gastrostomy status: Secondary | ICD-10-CM | POA: Diagnosis not present

## 2022-01-19 DIAGNOSIS — R000 Tachycardia, unspecified: Secondary | ICD-10-CM | POA: Diagnosis not present

## 2022-01-19 DIAGNOSIS — J9621 Acute and chronic respiratory failure with hypoxia: Secondary | ICD-10-CM | POA: Diagnosis not present

## 2022-01-19 DIAGNOSIS — I10 Essential (primary) hypertension: Secondary | ICD-10-CM | POA: Diagnosis not present

## 2022-01-19 DIAGNOSIS — J90 Pleural effusion, not elsewhere classified: Secondary | ICD-10-CM | POA: Diagnosis not present

## 2022-01-19 DIAGNOSIS — J9690 Respiratory failure, unspecified, unspecified whether with hypoxia or hypercapnia: Secondary | ICD-10-CM | POA: Diagnosis not present

## 2022-01-19 DIAGNOSIS — R531 Weakness: Secondary | ICD-10-CM | POA: Diagnosis not present

## 2022-01-19 DIAGNOSIS — Z930 Tracheostomy status: Secondary | ICD-10-CM | POA: Diagnosis not present

## 2022-01-19 DIAGNOSIS — R918 Other nonspecific abnormal finding of lung field: Secondary | ICD-10-CM | POA: Diagnosis not present

## 2022-01-19 DIAGNOSIS — Z8701 Personal history of pneumonia (recurrent): Secondary | ICD-10-CM | POA: Diagnosis not present

## 2022-01-19 DIAGNOSIS — J449 Chronic obstructive pulmonary disease, unspecified: Secondary | ICD-10-CM | POA: Diagnosis not present

## 2022-01-19 DIAGNOSIS — T17908A Unspecified foreign body in respiratory tract, part unspecified causing other injury, initial encounter: Secondary | ICD-10-CM | POA: Diagnosis not present

## 2022-01-19 DIAGNOSIS — M6281 Muscle weakness (generalized): Secondary | ICD-10-CM | POA: Diagnosis not present

## 2022-01-19 DIAGNOSIS — Z9911 Dependence on respirator [ventilator] status: Secondary | ICD-10-CM | POA: Diagnosis not present

## 2022-01-19 DIAGNOSIS — C9110 Chronic lymphocytic leukemia of B-cell type not having achieved remission: Secondary | ICD-10-CM | POA: Diagnosis not present

## 2022-01-19 DIAGNOSIS — E46 Unspecified protein-calorie malnutrition: Secondary | ICD-10-CM | POA: Diagnosis not present

## 2022-01-19 DIAGNOSIS — R1312 Dysphagia, oropharyngeal phase: Secondary | ICD-10-CM | POA: Diagnosis not present

## 2022-01-19 DIAGNOSIS — S51812A Laceration without foreign body of left forearm, initial encounter: Secondary | ICD-10-CM | POA: Diagnosis not present

## 2022-01-19 DIAGNOSIS — Y92129 Unspecified place in nursing home as the place of occurrence of the external cause: Secondary | ICD-10-CM | POA: Diagnosis not present

## 2022-01-19 DIAGNOSIS — W19XXXA Unspecified fall, initial encounter: Secondary | ICD-10-CM | POA: Diagnosis not present

## 2022-01-19 DIAGNOSIS — R1310 Dysphagia, unspecified: Secondary | ICD-10-CM | POA: Diagnosis not present

## 2022-01-20 DIAGNOSIS — S0033XA Contusion of nose, initial encounter: Secondary | ICD-10-CM | POA: Diagnosis not present

## 2022-01-20 DIAGNOSIS — S0121XA Laceration without foreign body of nose, initial encounter: Secondary | ICD-10-CM | POA: Diagnosis not present

## 2022-01-20 DIAGNOSIS — S51812A Laceration without foreign body of left forearm, initial encounter: Secondary | ICD-10-CM | POA: Diagnosis not present

## 2022-01-20 DIAGNOSIS — Y92129 Unspecified place in nursing home as the place of occurrence of the external cause: Secondary | ICD-10-CM | POA: Diagnosis not present

## 2022-01-20 DIAGNOSIS — Z23 Encounter for immunization: Secondary | ICD-10-CM | POA: Diagnosis not present

## 2022-01-20 DIAGNOSIS — W19XXXA Unspecified fall, initial encounter: Secondary | ICD-10-CM | POA: Diagnosis not present

## 2022-01-20 DIAGNOSIS — R0981 Nasal congestion: Secondary | ICD-10-CM | POA: Diagnosis not present

## 2022-01-20 DIAGNOSIS — Y9389 Activity, other specified: Secondary | ICD-10-CM | POA: Diagnosis not present

## 2022-01-20 DIAGNOSIS — W06XXXA Fall from bed, initial encounter: Secondary | ICD-10-CM | POA: Diagnosis not present

## 2022-01-20 DIAGNOSIS — R000 Tachycardia, unspecified: Secondary | ICD-10-CM | POA: Diagnosis not present

## 2022-01-22 DIAGNOSIS — U071 COVID-19: Secondary | ICD-10-CM | POA: Diagnosis not present

## 2022-01-22 DIAGNOSIS — Z9911 Dependence on respirator [ventilator] status: Secondary | ICD-10-CM | POA: Diagnosis not present

## 2022-01-22 DIAGNOSIS — Z930 Tracheostomy status: Secondary | ICD-10-CM | POA: Diagnosis not present

## 2022-01-22 DIAGNOSIS — C9110 Chronic lymphocytic leukemia of B-cell type not having achieved remission: Secondary | ICD-10-CM | POA: Diagnosis not present

## 2022-01-23 DIAGNOSIS — Z931 Gastrostomy status: Secondary | ICD-10-CM | POA: Diagnosis not present

## 2022-01-23 DIAGNOSIS — J9611 Chronic respiratory failure with hypoxia: Secondary | ICD-10-CM | POA: Diagnosis not present

## 2022-01-23 DIAGNOSIS — F419 Anxiety disorder, unspecified: Secondary | ICD-10-CM | POA: Diagnosis not present

## 2022-01-23 DIAGNOSIS — Z8616 Personal history of COVID-19: Secondary | ICD-10-CM | POA: Diagnosis not present

## 2022-01-24 ENCOUNTER — Other Ambulatory Visit (HOSPITAL_COMMUNITY): Payer: Self-pay

## 2022-01-25 ENCOUNTER — Other Ambulatory Visit (HOSPITAL_COMMUNITY): Payer: Self-pay

## 2022-01-25 DIAGNOSIS — Z9911 Dependence on respirator [ventilator] status: Secondary | ICD-10-CM | POA: Diagnosis not present

## 2022-01-25 DIAGNOSIS — J9690 Respiratory failure, unspecified, unspecified whether with hypoxia or hypercapnia: Secondary | ICD-10-CM | POA: Diagnosis not present

## 2022-01-25 DIAGNOSIS — Z930 Tracheostomy status: Secondary | ICD-10-CM | POA: Diagnosis not present

## 2022-01-26 ENCOUNTER — Inpatient Hospital Stay: Payer: BC Managed Care – PPO | Admitting: Oncology

## 2022-01-26 ENCOUNTER — Inpatient Hospital Stay: Payer: BC Managed Care – PPO

## 2022-01-31 ENCOUNTER — Other Ambulatory Visit (HOSPITAL_COMMUNITY): Payer: Self-pay

## 2022-02-01 ENCOUNTER — Other Ambulatory Visit (HOSPITAL_COMMUNITY): Payer: Self-pay

## 2022-02-02 DIAGNOSIS — R32 Unspecified urinary incontinence: Secondary | ICD-10-CM | POA: Diagnosis not present

## 2022-02-02 DIAGNOSIS — B379 Candidiasis, unspecified: Secondary | ICD-10-CM | POA: Diagnosis not present

## 2022-02-02 DIAGNOSIS — L24A9 Irritant contact dermatitis due friction or contact with other specified body fluids: Secondary | ICD-10-CM | POA: Diagnosis not present

## 2022-02-02 DIAGNOSIS — M6281 Muscle weakness (generalized): Secondary | ICD-10-CM | POA: Diagnosis not present

## 2022-02-05 DIAGNOSIS — N390 Urinary tract infection, site not specified: Secondary | ICD-10-CM | POA: Diagnosis not present

## 2022-02-08 DIAGNOSIS — Z9911 Dependence on respirator [ventilator] status: Secondary | ICD-10-CM | POA: Diagnosis not present

## 2022-02-08 DIAGNOSIS — D649 Anemia, unspecified: Secondary | ICD-10-CM | POA: Diagnosis not present

## 2022-02-08 DIAGNOSIS — Z931 Gastrostomy status: Secondary | ICD-10-CM | POA: Diagnosis not present

## 2022-02-08 DIAGNOSIS — Z930 Tracheostomy status: Secondary | ICD-10-CM | POA: Diagnosis not present

## 2022-02-09 DIAGNOSIS — L24A9 Irritant contact dermatitis due friction or contact with other specified body fluids: Secondary | ICD-10-CM | POA: Diagnosis not present

## 2022-02-09 DIAGNOSIS — R32 Unspecified urinary incontinence: Secondary | ICD-10-CM | POA: Diagnosis not present

## 2022-02-09 DIAGNOSIS — M6281 Muscle weakness (generalized): Secondary | ICD-10-CM | POA: Diagnosis not present

## 2022-02-09 DIAGNOSIS — B379 Candidiasis, unspecified: Secondary | ICD-10-CM | POA: Diagnosis not present

## 2022-02-12 DIAGNOSIS — R52 Pain, unspecified: Secondary | ICD-10-CM | POA: Diagnosis not present

## 2022-02-12 DIAGNOSIS — D649 Anemia, unspecified: Secondary | ICD-10-CM | POA: Diagnosis not present

## 2022-02-13 DIAGNOSIS — J9611 Chronic respiratory failure with hypoxia: Secondary | ICD-10-CM | POA: Diagnosis not present

## 2022-02-13 DIAGNOSIS — C9110 Chronic lymphocytic leukemia of B-cell type not having achieved remission: Secondary | ICD-10-CM | POA: Diagnosis not present

## 2022-02-13 DIAGNOSIS — T17908A Unspecified foreign body in respiratory tract, part unspecified causing other injury, initial encounter: Secondary | ICD-10-CM | POA: Diagnosis not present

## 2022-02-13 DIAGNOSIS — Z9911 Dependence on respirator [ventilator] status: Secondary | ICD-10-CM | POA: Diagnosis not present

## 2022-02-13 LAB — FUNGUS CULTURE WITH STAIN

## 2022-02-13 LAB — FUNGUS CULTURE RESULT

## 2022-02-13 LAB — FUNGAL ORGANISM REFLEX

## 2022-02-15 ENCOUNTER — Other Ambulatory Visit (HOSPITAL_COMMUNITY): Payer: Self-pay

## 2022-02-15 DIAGNOSIS — D649 Anemia, unspecified: Secondary | ICD-10-CM | POA: Diagnosis not present

## 2022-02-15 DIAGNOSIS — C9110 Chronic lymphocytic leukemia of B-cell type not having achieved remission: Secondary | ICD-10-CM | POA: Diagnosis not present

## 2022-02-15 DIAGNOSIS — E43 Unspecified severe protein-calorie malnutrition: Secondary | ICD-10-CM | POA: Diagnosis not present

## 2022-02-15 DIAGNOSIS — F419 Anxiety disorder, unspecified: Secondary | ICD-10-CM | POA: Diagnosis not present

## 2022-02-19 DIAGNOSIS — F419 Anxiety disorder, unspecified: Secondary | ICD-10-CM | POA: Diagnosis not present

## 2022-02-19 DIAGNOSIS — R1310 Dysphagia, unspecified: Secondary | ICD-10-CM | POA: Diagnosis not present

## 2022-02-20 ENCOUNTER — Other Ambulatory Visit (HOSPITAL_COMMUNITY): Payer: Self-pay

## 2022-02-20 DIAGNOSIS — Z930 Tracheostomy status: Secondary | ICD-10-CM | POA: Diagnosis not present

## 2022-02-20 DIAGNOSIS — R5383 Other fatigue: Secondary | ICD-10-CM | POA: Diagnosis not present

## 2022-02-20 DIAGNOSIS — C9110 Chronic lymphocytic leukemia of B-cell type not having achieved remission: Secondary | ICD-10-CM | POA: Diagnosis not present

## 2022-02-20 DIAGNOSIS — J9611 Chronic respiratory failure with hypoxia: Secondary | ICD-10-CM | POA: Diagnosis not present

## 2022-02-21 ENCOUNTER — Other Ambulatory Visit (HOSPITAL_COMMUNITY): Payer: Self-pay

## 2022-02-22 DIAGNOSIS — Z930 Tracheostomy status: Secondary | ICD-10-CM | POA: Diagnosis not present

## 2022-02-22 DIAGNOSIS — J9611 Chronic respiratory failure with hypoxia: Secondary | ICD-10-CM | POA: Diagnosis not present

## 2022-02-22 DIAGNOSIS — D649 Anemia, unspecified: Secondary | ICD-10-CM | POA: Diagnosis not present

## 2022-02-22 DIAGNOSIS — Z9911 Dependence on respirator [ventilator] status: Secondary | ICD-10-CM | POA: Diagnosis not present

## 2022-02-25 DIAGNOSIS — Z930 Tracheostomy status: Secondary | ICD-10-CM | POA: Diagnosis not present

## 2022-02-25 DIAGNOSIS — I4510 Unspecified right bundle-branch block: Secondary | ICD-10-CM | POA: Diagnosis not present

## 2022-02-25 DIAGNOSIS — Z9911 Dependence on respirator [ventilator] status: Secondary | ICD-10-CM | POA: Diagnosis not present

## 2022-02-25 DIAGNOSIS — R260 Ataxic gait: Secondary | ICD-10-CM | POA: Diagnosis not present

## 2022-02-25 DIAGNOSIS — N3000 Acute cystitis without hematuria: Secondary | ICD-10-CM | POA: Diagnosis not present

## 2022-02-25 DIAGNOSIS — B9689 Other specified bacterial agents as the cause of diseases classified elsewhere: Secondary | ICD-10-CM | POA: Diagnosis not present

## 2022-02-25 DIAGNOSIS — R4182 Altered mental status, unspecified: Secondary | ICD-10-CM | POA: Diagnosis not present

## 2022-02-25 DIAGNOSIS — J9509 Other tracheostomy complication: Secondary | ICD-10-CM | POA: Diagnosis not present

## 2022-02-25 DIAGNOSIS — R9431 Abnormal electrocardiogram [ECG] [EKG]: Secondary | ICD-10-CM | POA: Diagnosis not present

## 2022-02-25 DIAGNOSIS — Z20822 Contact with and (suspected) exposure to covid-19: Secondary | ICD-10-CM | POA: Diagnosis not present

## 2022-02-26 DIAGNOSIS — R918 Other nonspecific abnormal finding of lung field: Secondary | ICD-10-CM | POA: Diagnosis not present

## 2022-02-26 DIAGNOSIS — N390 Urinary tract infection, site not specified: Secondary | ICD-10-CM | POA: Diagnosis not present

## 2022-02-26 DIAGNOSIS — R410 Disorientation, unspecified: Secondary | ICD-10-CM | POA: Diagnosis not present

## 2022-02-27 DIAGNOSIS — R451 Restlessness and agitation: Secondary | ICD-10-CM | POA: Diagnosis not present

## 2022-02-27 DIAGNOSIS — J9621 Acute and chronic respiratory failure with hypoxia: Secondary | ICD-10-CM | POA: Diagnosis not present

## 2022-02-27 DIAGNOSIS — R918 Other nonspecific abnormal finding of lung field: Secondary | ICD-10-CM | POA: Diagnosis not present

## 2022-02-27 DIAGNOSIS — R609 Edema, unspecified: Secondary | ICD-10-CM | POA: Diagnosis not present

## 2022-02-27 DIAGNOSIS — T1490XA Injury, unspecified, initial encounter: Secondary | ICD-10-CM | POA: Diagnosis not present

## 2022-02-27 DIAGNOSIS — J449 Chronic obstructive pulmonary disease, unspecified: Secondary | ICD-10-CM | POA: Diagnosis not present

## 2022-02-27 DIAGNOSIS — D696 Thrombocytopenia, unspecified: Secondary | ICD-10-CM | POA: Diagnosis not present

## 2022-02-27 DIAGNOSIS — D649 Anemia, unspecified: Secondary | ICD-10-CM | POA: Diagnosis not present

## 2022-02-27 DIAGNOSIS — F32A Depression, unspecified: Secondary | ICD-10-CM | POA: Diagnosis not present

## 2022-02-27 DIAGNOSIS — D72821 Monocytosis (symptomatic): Secondary | ICD-10-CM | POA: Diagnosis not present

## 2022-02-27 DIAGNOSIS — C9111 Chronic lymphocytic leukemia of B-cell type in remission: Secondary | ICD-10-CM | POA: Diagnosis not present

## 2022-02-27 DIAGNOSIS — R1310 Dysphagia, unspecified: Secondary | ICD-10-CM | POA: Diagnosis not present

## 2022-02-27 DIAGNOSIS — Z9911 Dependence on respirator [ventilator] status: Secondary | ICD-10-CM | POA: Diagnosis not present

## 2022-02-27 DIAGNOSIS — J189 Pneumonia, unspecified organism: Secondary | ICD-10-CM | POA: Diagnosis not present

## 2022-02-27 DIAGNOSIS — B952 Enterococcus as the cause of diseases classified elsewhere: Secondary | ICD-10-CM | POA: Diagnosis not present

## 2022-02-27 DIAGNOSIS — M6281 Muscle weakness (generalized): Secondary | ICD-10-CM | POA: Diagnosis not present

## 2022-02-27 DIAGNOSIS — J9622 Acute and chronic respiratory failure with hypercapnia: Secondary | ICD-10-CM | POA: Diagnosis not present

## 2022-02-27 DIAGNOSIS — Z930 Tracheostomy status: Secondary | ICD-10-CM | POA: Diagnosis not present

## 2022-02-27 DIAGNOSIS — J440 Chronic obstructive pulmonary disease with acute lower respiratory infection: Secondary | ICD-10-CM | POA: Diagnosis not present

## 2022-02-27 DIAGNOSIS — Z20822 Contact with and (suspected) exposure to covid-19: Secondary | ICD-10-CM | POA: Diagnosis not present

## 2022-02-27 DIAGNOSIS — R270 Ataxia, unspecified: Secondary | ICD-10-CM | POA: Diagnosis not present

## 2022-02-27 DIAGNOSIS — C9100 Acute lymphoblastic leukemia not having achieved remission: Secondary | ICD-10-CM | POA: Diagnosis not present

## 2022-02-27 DIAGNOSIS — C9110 Chronic lymphocytic leukemia of B-cell type not having achieved remission: Secondary | ICD-10-CM | POA: Diagnosis not present

## 2022-02-27 DIAGNOSIS — C799 Secondary malignant neoplasm of unspecified site: Secondary | ICD-10-CM | POA: Diagnosis not present

## 2022-02-27 DIAGNOSIS — R5381 Other malaise: Secondary | ICD-10-CM | POA: Diagnosis not present

## 2022-02-27 DIAGNOSIS — R1312 Dysphagia, oropharyngeal phase: Secondary | ICD-10-CM | POA: Diagnosis not present

## 2022-02-27 DIAGNOSIS — E43 Unspecified severe protein-calorie malnutrition: Secondary | ICD-10-CM | POA: Diagnosis not present

## 2022-02-27 DIAGNOSIS — J90 Pleural effusion, not elsewhere classified: Secondary | ICD-10-CM | POA: Diagnosis not present

## 2022-02-27 DIAGNOSIS — G9341 Metabolic encephalopathy: Secondary | ICD-10-CM | POA: Diagnosis not present

## 2022-02-27 DIAGNOSIS — Z66 Do not resuscitate: Secondary | ICD-10-CM | POA: Diagnosis not present

## 2022-02-27 DIAGNOSIS — R000 Tachycardia, unspecified: Secondary | ICD-10-CM | POA: Diagnosis not present

## 2022-02-27 DIAGNOSIS — A419 Sepsis, unspecified organism: Secondary | ICD-10-CM | POA: Diagnosis not present

## 2022-02-27 DIAGNOSIS — N390 Urinary tract infection, site not specified: Secondary | ICD-10-CM | POA: Diagnosis not present

## 2022-02-27 DIAGNOSIS — F05 Delirium due to known physiological condition: Secondary | ICD-10-CM | POA: Diagnosis not present

## 2022-02-27 DIAGNOSIS — D509 Iron deficiency anemia, unspecified: Secondary | ICD-10-CM | POA: Diagnosis not present

## 2022-02-27 DIAGNOSIS — I10 Essential (primary) hypertension: Secondary | ICD-10-CM | POA: Diagnosis not present

## 2022-02-27 DIAGNOSIS — Z9181 History of falling: Secondary | ICD-10-CM | POA: Diagnosis not present

## 2022-02-27 DIAGNOSIS — Z515 Encounter for palliative care: Secondary | ICD-10-CM | POA: Diagnosis not present

## 2022-02-27 DIAGNOSIS — F419 Anxiety disorder, unspecified: Secondary | ICD-10-CM | POA: Diagnosis not present

## 2022-03-10 DIAGNOSIS — R1312 Dysphagia, oropharyngeal phase: Secondary | ICD-10-CM | POA: Diagnosis not present

## 2022-03-10 DIAGNOSIS — C9110 Chronic lymphocytic leukemia of B-cell type not having achieved remission: Secondary | ICD-10-CM | POA: Diagnosis not present

## 2022-03-10 DIAGNOSIS — J302 Other seasonal allergic rhinitis: Secondary | ICD-10-CM | POA: Diagnosis not present

## 2022-03-10 DIAGNOSIS — D649 Anemia, unspecified: Secondary | ICD-10-CM | POA: Diagnosis not present

## 2022-03-10 DIAGNOSIS — Z9181 History of falling: Secondary | ICD-10-CM | POA: Diagnosis not present

## 2022-03-10 DIAGNOSIS — Z930 Tracheostomy status: Secondary | ICD-10-CM | POA: Diagnosis not present

## 2022-03-10 DIAGNOSIS — J9621 Acute and chronic respiratory failure with hypoxia: Secondary | ICD-10-CM | POA: Diagnosis not present

## 2022-03-10 DIAGNOSIS — R32 Unspecified urinary incontinence: Secondary | ICD-10-CM | POA: Diagnosis not present

## 2022-03-10 DIAGNOSIS — R1310 Dysphagia, unspecified: Secondary | ICD-10-CM | POA: Diagnosis not present

## 2022-03-10 DIAGNOSIS — Z931 Gastrostomy status: Secondary | ICD-10-CM | POA: Diagnosis not present

## 2022-03-10 DIAGNOSIS — I10 Essential (primary) hypertension: Secondary | ICD-10-CM | POA: Diagnosis not present

## 2022-03-10 DIAGNOSIS — F419 Anxiety disorder, unspecified: Secondary | ICD-10-CM | POA: Diagnosis not present

## 2022-03-10 DIAGNOSIS — F0631 Mood disorder due to known physiological condition with depressive features: Secondary | ICD-10-CM | POA: Diagnosis not present

## 2022-03-10 DIAGNOSIS — D696 Thrombocytopenia, unspecified: Secondary | ICD-10-CM | POA: Diagnosis not present

## 2022-03-10 DIAGNOSIS — E43 Unspecified severe protein-calorie malnutrition: Secondary | ICD-10-CM | POA: Diagnosis not present

## 2022-03-10 DIAGNOSIS — J9622 Acute and chronic respiratory failure with hypercapnia: Secondary | ICD-10-CM | POA: Diagnosis not present

## 2022-03-10 DIAGNOSIS — J449 Chronic obstructive pulmonary disease, unspecified: Secondary | ICD-10-CM | POA: Diagnosis not present

## 2022-03-10 DIAGNOSIS — J189 Pneumonia, unspecified organism: Secondary | ICD-10-CM | POA: Diagnosis not present

## 2022-03-10 DIAGNOSIS — F32A Depression, unspecified: Secondary | ICD-10-CM | POA: Diagnosis not present

## 2022-03-10 DIAGNOSIS — C9111 Chronic lymphocytic leukemia of B-cell type in remission: Secondary | ICD-10-CM | POA: Diagnosis not present

## 2022-03-10 DIAGNOSIS — N390 Urinary tract infection, site not specified: Secondary | ICD-10-CM | POA: Diagnosis not present

## 2022-03-10 DIAGNOSIS — G629 Polyneuropathy, unspecified: Secondary | ICD-10-CM | POA: Diagnosis not present

## 2022-03-10 DIAGNOSIS — Z9911 Dependence on respirator [ventilator] status: Secondary | ICD-10-CM | POA: Diagnosis not present

## 2022-03-10 DIAGNOSIS — R270 Ataxia, unspecified: Secondary | ICD-10-CM | POA: Diagnosis not present

## 2022-03-10 DIAGNOSIS — B952 Enterococcus as the cause of diseases classified elsewhere: Secondary | ICD-10-CM | POA: Diagnosis not present

## 2022-03-10 DIAGNOSIS — M6281 Muscle weakness (generalized): Secondary | ICD-10-CM | POA: Diagnosis not present

## 2022-03-10 DIAGNOSIS — L22 Diaper dermatitis: Secondary | ICD-10-CM | POA: Diagnosis not present

## 2022-03-12 DIAGNOSIS — D649 Anemia, unspecified: Secondary | ICD-10-CM | POA: Diagnosis not present

## 2022-03-12 DIAGNOSIS — Z930 Tracheostomy status: Secondary | ICD-10-CM | POA: Diagnosis not present

## 2022-03-12 DIAGNOSIS — Z931 Gastrostomy status: Secondary | ICD-10-CM | POA: Diagnosis not present

## 2022-03-12 DIAGNOSIS — Z9911 Dependence on respirator [ventilator] status: Secondary | ICD-10-CM | POA: Diagnosis not present

## 2022-03-16 DIAGNOSIS — L22 Diaper dermatitis: Secondary | ICD-10-CM | POA: Diagnosis not present

## 2022-03-16 DIAGNOSIS — R1310 Dysphagia, unspecified: Secondary | ICD-10-CM | POA: Diagnosis not present

## 2022-03-16 DIAGNOSIS — R32 Unspecified urinary incontinence: Secondary | ICD-10-CM | POA: Diagnosis not present

## 2022-03-16 DIAGNOSIS — J9621 Acute and chronic respiratory failure with hypoxia: Secondary | ICD-10-CM | POA: Diagnosis not present

## 2022-03-16 DIAGNOSIS — C9110 Chronic lymphocytic leukemia of B-cell type not having achieved remission: Secondary | ICD-10-CM | POA: Diagnosis not present

## 2022-03-16 DIAGNOSIS — M6281 Muscle weakness (generalized): Secondary | ICD-10-CM | POA: Diagnosis not present

## 2022-03-16 DIAGNOSIS — J9622 Acute and chronic respiratory failure with hypercapnia: Secondary | ICD-10-CM | POA: Diagnosis not present

## 2022-03-19 DIAGNOSIS — J302 Other seasonal allergic rhinitis: Secondary | ICD-10-CM | POA: Diagnosis not present

## 2022-03-22 DIAGNOSIS — Z930 Tracheostomy status: Secondary | ICD-10-CM | POA: Diagnosis not present

## 2022-03-22 DIAGNOSIS — Z9911 Dependence on respirator [ventilator] status: Secondary | ICD-10-CM | POA: Diagnosis not present

## 2022-03-22 DIAGNOSIS — Z931 Gastrostomy status: Secondary | ICD-10-CM | POA: Diagnosis not present

## 2022-03-22 DIAGNOSIS — G629 Polyneuropathy, unspecified: Secondary | ICD-10-CM | POA: Diagnosis not present

## 2022-03-23 DIAGNOSIS — M6281 Muscle weakness (generalized): Secondary | ICD-10-CM | POA: Diagnosis not present

## 2022-03-23 DIAGNOSIS — R32 Unspecified urinary incontinence: Secondary | ICD-10-CM | POA: Diagnosis not present

## 2022-03-23 DIAGNOSIS — L22 Diaper dermatitis: Secondary | ICD-10-CM | POA: Diagnosis not present

## 2022-03-24 DIAGNOSIS — K5901 Slow transit constipation: Secondary | ICD-10-CM | POA: Diagnosis not present

## 2022-03-24 DIAGNOSIS — R1084 Generalized abdominal pain: Secondary | ICD-10-CM | POA: Diagnosis not present

## 2022-03-24 DIAGNOSIS — Z9189 Other specified personal risk factors, not elsewhere classified: Secondary | ICD-10-CM | POA: Diagnosis not present

## 2022-03-24 DIAGNOSIS — Z789 Other specified health status: Secondary | ICD-10-CM | POA: Diagnosis not present

## 2022-03-26 DIAGNOSIS — Z79899 Other long term (current) drug therapy: Secondary | ICD-10-CM | POA: Diagnosis not present

## 2022-03-26 DIAGNOSIS — K219 Gastro-esophageal reflux disease without esophagitis: Secondary | ICD-10-CM | POA: Diagnosis not present

## 2022-03-29 DIAGNOSIS — G629 Polyneuropathy, unspecified: Secondary | ICD-10-CM | POA: Diagnosis not present

## 2022-03-30 DIAGNOSIS — Z930 Tracheostomy status: Secondary | ICD-10-CM | POA: Diagnosis not present

## 2022-03-30 DIAGNOSIS — J9611 Chronic respiratory failure with hypoxia: Secondary | ICD-10-CM | POA: Diagnosis not present

## 2022-03-30 DIAGNOSIS — J449 Chronic obstructive pulmonary disease, unspecified: Secondary | ICD-10-CM | POA: Diagnosis not present

## 2022-03-30 DIAGNOSIS — Z931 Gastrostomy status: Secondary | ICD-10-CM | POA: Diagnosis not present

## 2022-04-02 DIAGNOSIS — J984 Other disorders of lung: Secondary | ICD-10-CM | POA: Diagnosis not present

## 2022-04-02 DIAGNOSIS — R059 Cough, unspecified: Secondary | ICD-10-CM | POA: Diagnosis not present

## 2022-04-02 DIAGNOSIS — K5900 Constipation, unspecified: Secondary | ICD-10-CM | POA: Diagnosis not present

## 2022-04-02 DIAGNOSIS — J988 Other specified respiratory disorders: Secondary | ICD-10-CM | POA: Diagnosis not present

## 2022-04-04 DIAGNOSIS — J9611 Chronic respiratory failure with hypoxia: Secondary | ICD-10-CM | POA: Diagnosis not present

## 2022-04-04 DIAGNOSIS — D649 Anemia, unspecified: Secondary | ICD-10-CM | POA: Diagnosis not present

## 2022-04-04 DIAGNOSIS — Z931 Gastrostomy status: Secondary | ICD-10-CM | POA: Diagnosis not present

## 2022-04-04 DIAGNOSIS — Z930 Tracheostomy status: Secondary | ICD-10-CM | POA: Diagnosis not present

## 2022-04-05 DIAGNOSIS — K5900 Constipation, unspecified: Secondary | ICD-10-CM | POA: Diagnosis not present

## 2022-04-06 DIAGNOSIS — F5105 Insomnia due to other mental disorder: Secondary | ICD-10-CM | POA: Diagnosis not present

## 2022-04-06 DIAGNOSIS — F419 Anxiety disorder, unspecified: Secondary | ICD-10-CM | POA: Diagnosis not present

## 2022-04-09 DIAGNOSIS — Z9189 Other specified personal risk factors, not elsewhere classified: Secondary | ICD-10-CM | POA: Diagnosis not present

## 2022-04-09 DIAGNOSIS — C9110 Chronic lymphocytic leukemia of B-cell type not having achieved remission: Secondary | ICD-10-CM | POA: Diagnosis not present

## 2022-04-09 DIAGNOSIS — R109 Unspecified abdominal pain: Secondary | ICD-10-CM | POA: Diagnosis not present

## 2022-04-09 DIAGNOSIS — J449 Chronic obstructive pulmonary disease, unspecified: Secondary | ICD-10-CM | POA: Diagnosis not present

## 2022-04-09 DIAGNOSIS — J9611 Chronic respiratory failure with hypoxia: Secondary | ICD-10-CM | POA: Diagnosis not present

## 2022-04-09 DIAGNOSIS — N189 Chronic kidney disease, unspecified: Secondary | ICD-10-CM | POA: Diagnosis not present

## 2022-04-11 DIAGNOSIS — C9110 Chronic lymphocytic leukemia of B-cell type not having achieved remission: Secondary | ICD-10-CM | POA: Diagnosis not present

## 2022-04-11 DIAGNOSIS — J9611 Chronic respiratory failure with hypoxia: Secondary | ICD-10-CM | POA: Diagnosis not present

## 2022-04-11 DIAGNOSIS — N189 Chronic kidney disease, unspecified: Secondary | ICD-10-CM | POA: Diagnosis not present

## 2022-04-11 DIAGNOSIS — J449 Chronic obstructive pulmonary disease, unspecified: Secondary | ICD-10-CM | POA: Diagnosis not present

## 2022-04-12 DIAGNOSIS — J449 Chronic obstructive pulmonary disease, unspecified: Secondary | ICD-10-CM | POA: Diagnosis not present

## 2022-04-12 DIAGNOSIS — R109 Unspecified abdominal pain: Secondary | ICD-10-CM | POA: Diagnosis not present

## 2022-04-13 ENCOUNTER — Other Ambulatory Visit (HOSPITAL_COMMUNITY): Payer: Self-pay

## 2022-04-16 DIAGNOSIS — D649 Anemia, unspecified: Secondary | ICD-10-CM | POA: Diagnosis not present

## 2022-04-18 DIAGNOSIS — J189 Pneumonia, unspecified organism: Secondary | ICD-10-CM | POA: Diagnosis not present

## 2022-04-18 DIAGNOSIS — R918 Other nonspecific abnormal finding of lung field: Secondary | ICD-10-CM | POA: Diagnosis not present

## 2022-04-19 DIAGNOSIS — R918 Other nonspecific abnormal finding of lung field: Secondary | ICD-10-CM | POA: Diagnosis not present

## 2022-04-19 DIAGNOSIS — J189 Pneumonia, unspecified organism: Secondary | ICD-10-CM | POA: Diagnosis not present

## 2022-04-25 DIAGNOSIS — R1900 Intra-abdominal and pelvic swelling, mass and lump, unspecified site: Secondary | ICD-10-CM | POA: Diagnosis not present

## 2022-04-25 DIAGNOSIS — K5900 Constipation, unspecified: Secondary | ICD-10-CM | POA: Diagnosis not present

## 2022-04-26 DIAGNOSIS — K5900 Constipation, unspecified: Secondary | ICD-10-CM | POA: Diagnosis not present

## 2022-05-02 DIAGNOSIS — T148XXA Other injury of unspecified body region, initial encounter: Secondary | ICD-10-CM | POA: Diagnosis not present

## 2022-05-02 DIAGNOSIS — K5900 Constipation, unspecified: Secondary | ICD-10-CM | POA: Diagnosis not present

## 2022-05-02 DIAGNOSIS — J449 Chronic obstructive pulmonary disease, unspecified: Secondary | ICD-10-CM | POA: Diagnosis not present

## 2022-05-02 DIAGNOSIS — R634 Abnormal weight loss: Secondary | ICD-10-CM | POA: Diagnosis not present

## 2022-05-03 DIAGNOSIS — R634 Abnormal weight loss: Secondary | ICD-10-CM | POA: Diagnosis not present

## 2022-05-03 DIAGNOSIS — T148XXA Other injury of unspecified body region, initial encounter: Secondary | ICD-10-CM | POA: Diagnosis not present

## 2022-05-03 DIAGNOSIS — J449 Chronic obstructive pulmonary disease, unspecified: Secondary | ICD-10-CM | POA: Diagnosis not present

## 2022-05-04 DIAGNOSIS — H43813 Vitreous degeneration, bilateral: Secondary | ICD-10-CM | POA: Diagnosis not present

## 2022-05-04 DIAGNOSIS — W19XXXA Unspecified fall, initial encounter: Secondary | ICD-10-CM | POA: Diagnosis not present

## 2022-05-04 DIAGNOSIS — R262 Difficulty in walking, not elsewhere classified: Secondary | ICD-10-CM | POA: Diagnosis not present

## 2022-05-04 DIAGNOSIS — H2513 Age-related nuclear cataract, bilateral: Secondary | ICD-10-CM | POA: Diagnosis not present

## 2022-05-07 DIAGNOSIS — J302 Other seasonal allergic rhinitis: Secondary | ICD-10-CM | POA: Diagnosis not present

## 2022-05-07 DIAGNOSIS — I10 Essential (primary) hypertension: Secondary | ICD-10-CM | POA: Diagnosis not present

## 2022-05-07 DIAGNOSIS — M6281 Muscle weakness (generalized): Secondary | ICD-10-CM | POA: Diagnosis not present

## 2022-05-07 DIAGNOSIS — R0602 Shortness of breath: Secondary | ICD-10-CM | POA: Diagnosis not present

## 2022-05-09 DIAGNOSIS — M6281 Muscle weakness (generalized): Secondary | ICD-10-CM | POA: Diagnosis not present

## 2022-05-09 DIAGNOSIS — I10 Essential (primary) hypertension: Secondary | ICD-10-CM | POA: Diagnosis not present

## 2022-05-11 DIAGNOSIS — R093 Abnormal sputum: Secondary | ICD-10-CM | POA: Diagnosis not present

## 2022-05-11 DIAGNOSIS — I517 Cardiomegaly: Secondary | ICD-10-CM | POA: Diagnosis not present

## 2022-05-12 DIAGNOSIS — Z743 Need for continuous supervision: Secondary | ICD-10-CM | POA: Diagnosis not present

## 2022-05-12 DIAGNOSIS — R0600 Dyspnea, unspecified: Secondary | ICD-10-CM | POA: Diagnosis not present

## 2022-05-12 DIAGNOSIS — J189 Pneumonia, unspecified organism: Secondary | ICD-10-CM | POA: Diagnosis not present

## 2022-05-12 DIAGNOSIS — I4510 Unspecified right bundle-branch block: Secondary | ICD-10-CM | POA: Diagnosis not present

## 2022-05-12 DIAGNOSIS — R0602 Shortness of breath: Secondary | ICD-10-CM | POA: Diagnosis not present

## 2022-05-12 DIAGNOSIS — R590 Localized enlarged lymph nodes: Secondary | ICD-10-CM | POA: Diagnosis not present

## 2022-05-12 DIAGNOSIS — R591 Generalized enlarged lymph nodes: Secondary | ICD-10-CM | POA: Diagnosis not present

## 2022-05-12 DIAGNOSIS — B9689 Other specified bacterial agents as the cause of diseases classified elsewhere: Secondary | ICD-10-CM | POA: Diagnosis not present

## 2022-05-12 DIAGNOSIS — R9431 Abnormal electrocardiogram [ECG] [EKG]: Secondary | ICD-10-CM | POA: Diagnosis not present

## 2022-05-12 DIAGNOSIS — I452 Bifascicular block: Secondary | ICD-10-CM | POA: Diagnosis not present

## 2022-05-12 DIAGNOSIS — I445 Left posterior fascicular block: Secondary | ICD-10-CM | POA: Diagnosis not present

## 2022-05-13 DIAGNOSIS — J449 Chronic obstructive pulmonary disease, unspecified: Secondary | ICD-10-CM | POA: Diagnosis not present

## 2022-05-13 DIAGNOSIS — Z9981 Dependence on supplemental oxygen: Secondary | ICD-10-CM | POA: Diagnosis not present

## 2022-05-16 DIAGNOSIS — C9110 Chronic lymphocytic leukemia of B-cell type not having achieved remission: Secondary | ICD-10-CM | POA: Diagnosis not present

## 2022-05-16 DIAGNOSIS — D649 Anemia, unspecified: Secondary | ICD-10-CM | POA: Diagnosis not present

## 2022-05-21 DIAGNOSIS — C9110 Chronic lymphocytic leukemia of B-cell type not having achieved remission: Secondary | ICD-10-CM | POA: Diagnosis not present

## 2022-05-21 DIAGNOSIS — Z931 Gastrostomy status: Secondary | ICD-10-CM | POA: Diagnosis not present

## 2022-05-21 DIAGNOSIS — F419 Anxiety disorder, unspecified: Secondary | ICD-10-CM | POA: Diagnosis not present

## 2022-05-21 DIAGNOSIS — D649 Anemia, unspecified: Secondary | ICD-10-CM | POA: Diagnosis not present

## 2022-05-23 DIAGNOSIS — R634 Abnormal weight loss: Secondary | ICD-10-CM | POA: Diagnosis not present

## 2022-06-01 DIAGNOSIS — F0631 Mood disorder due to known physiological condition with depressive features: Secondary | ICD-10-CM | POA: Diagnosis not present

## 2022-06-01 DIAGNOSIS — F419 Anxiety disorder, unspecified: Secondary | ICD-10-CM | POA: Diagnosis not present

## 2022-06-04 DIAGNOSIS — K5901 Slow transit constipation: Secondary | ICD-10-CM | POA: Diagnosis not present

## 2022-06-05 DIAGNOSIS — Z931 Gastrostomy status: Secondary | ICD-10-CM | POA: Diagnosis not present

## 2022-06-05 DIAGNOSIS — J9611 Chronic respiratory failure with hypoxia: Secondary | ICD-10-CM | POA: Diagnosis not present

## 2022-06-05 DIAGNOSIS — I10 Essential (primary) hypertension: Secondary | ICD-10-CM | POA: Diagnosis not present

## 2022-06-05 DIAGNOSIS — J449 Chronic obstructive pulmonary disease, unspecified: Secondary | ICD-10-CM | POA: Diagnosis not present

## 2022-06-11 DIAGNOSIS — J029 Acute pharyngitis, unspecified: Secondary | ICD-10-CM | POA: Diagnosis not present

## 2022-06-13 DIAGNOSIS — Z09 Encounter for follow-up examination after completed treatment for conditions other than malignant neoplasm: Secondary | ICD-10-CM | POA: Diagnosis not present

## 2022-06-13 DIAGNOSIS — C9110 Chronic lymphocytic leukemia of B-cell type not having achieved remission: Secondary | ICD-10-CM | POA: Diagnosis not present

## 2022-06-13 DIAGNOSIS — I10 Essential (primary) hypertension: Secondary | ICD-10-CM | POA: Diagnosis not present

## 2022-06-13 DIAGNOSIS — S30810A Abrasion of lower back and pelvis, initial encounter: Secondary | ICD-10-CM | POA: Diagnosis not present

## 2022-06-15 DIAGNOSIS — C9110 Chronic lymphocytic leukemia of B-cell type not having achieved remission: Secondary | ICD-10-CM | POA: Diagnosis not present

## 2022-06-15 DIAGNOSIS — I10 Essential (primary) hypertension: Secondary | ICD-10-CM | POA: Diagnosis not present

## 2022-06-15 DIAGNOSIS — Z09 Encounter for follow-up examination after completed treatment for conditions other than malignant neoplasm: Secondary | ICD-10-CM | POA: Diagnosis not present

## 2022-06-18 DIAGNOSIS — Z931 Gastrostomy status: Secondary | ICD-10-CM | POA: Diagnosis not present

## 2022-06-18 DIAGNOSIS — D649 Anemia, unspecified: Secondary | ICD-10-CM | POA: Diagnosis not present

## 2022-06-18 DIAGNOSIS — E43 Unspecified severe protein-calorie malnutrition: Secondary | ICD-10-CM | POA: Diagnosis not present

## 2022-06-18 DIAGNOSIS — I10 Essential (primary) hypertension: Secondary | ICD-10-CM | POA: Diagnosis not present

## 2022-06-20 ENCOUNTER — Telehealth: Payer: Self-pay | Admitting: Internal Medicine

## 2022-06-20 DIAGNOSIS — R2241 Localized swelling, mass and lump, right lower limb: Secondary | ICD-10-CM | POA: Diagnosis not present

## 2022-06-20 DIAGNOSIS — R079 Chest pain, unspecified: Secondary | ICD-10-CM | POA: Diagnosis not present

## 2022-06-20 DIAGNOSIS — R6889 Other general symptoms and signs: Secondary | ICD-10-CM | POA: Diagnosis not present

## 2022-06-20 DIAGNOSIS — M7989 Other specified soft tissue disorders: Secondary | ICD-10-CM | POA: Diagnosis not present

## 2022-06-20 NOTE — Telephone Encounter (Signed)
Pt has been in facility in [place] since February. Is currently moving to one in [facility], but pt has no more oxygen tanks as those were taken by [place] [facility] if they would still provide two tanks for pt to have for travelling purposes. Please advise.

## 2022-06-21 DIAGNOSIS — R2241 Localized swelling, mass and lump, right lower limb: Secondary | ICD-10-CM | POA: Diagnosis not present

## 2022-06-21 NOTE — Telephone Encounter (Signed)
Called patient's spouse but she did not answer. Left message for her to call back.  

## 2022-06-22 DIAGNOSIS — R079 Chest pain, unspecified: Secondary | ICD-10-CM | POA: Diagnosis not present

## 2022-06-22 DIAGNOSIS — R6889 Other general symptoms and signs: Secondary | ICD-10-CM | POA: Diagnosis not present

## 2022-06-23 ENCOUNTER — Other Ambulatory Visit: Payer: Self-pay | Admitting: Internal Medicine

## 2022-06-26 ENCOUNTER — Telehealth: Payer: Self-pay | Admitting: *Deleted

## 2022-06-26 DIAGNOSIS — I10 Essential (primary) hypertension: Secondary | ICD-10-CM | POA: Diagnosis not present

## 2022-06-26 NOTE — Telephone Encounter (Signed)
-----   Message from Wyatt Portela, MD sent at 06/25/2022  7:02 PM EDT ----- Regarding: RE: Kate Sable He has not been under my care for a while. He is suppose to be on this medication last time I saw him. ----- Message ----- From: Rolene Course, RN Sent: 06/25/2022   1:36 PM EDT To: Arna Snipe, RN; Wyatt Portela, MD Subject: Kate Sable                                      Mr Shawgo has been admitted to [facility], a nurse called today & asked if he is supposed to be taking Imbruvica.  Your note from 10/2021 indicates that was the plan at the time, however, he has had some hospitalizations & health changes since that time.  Please advise.  Thanks, Bethena Roys

## 2022-06-26 NOTE — Telephone Encounter (Signed)
PC to Modesta Messing at [facility], informed him of Dr Hazeline Junker advice as below regarding this patient.  He verbalizes understanding.

## 2022-06-27 DIAGNOSIS — D509 Iron deficiency anemia, unspecified: Secondary | ICD-10-CM | POA: Diagnosis not present

## 2022-06-27 DIAGNOSIS — J449 Chronic obstructive pulmonary disease, unspecified: Secondary | ICD-10-CM | POA: Diagnosis not present

## 2022-06-27 DIAGNOSIS — J984 Other disorders of lung: Secondary | ICD-10-CM | POA: Diagnosis not present

## 2022-06-27 DIAGNOSIS — I10 Essential (primary) hypertension: Secondary | ICD-10-CM | POA: Diagnosis not present

## 2022-06-29 DIAGNOSIS — J189 Pneumonia, unspecified organism: Secondary | ICD-10-CM | POA: Diagnosis not present

## 2022-06-29 DIAGNOSIS — C9110 Chronic lymphocytic leukemia of B-cell type not having achieved remission: Secondary | ICD-10-CM | POA: Diagnosis not present

## 2022-06-29 DIAGNOSIS — R5381 Other malaise: Secondary | ICD-10-CM | POA: Diagnosis not present

## 2022-06-29 DIAGNOSIS — Z930 Tracheostomy status: Secondary | ICD-10-CM | POA: Diagnosis not present

## 2022-07-03 ENCOUNTER — Telehealth: Payer: Self-pay | Admitting: Oncology

## 2022-07-03 NOTE — Telephone Encounter (Signed)
Scheduled per 8/8 in basket, message has been left

## 2022-07-04 DIAGNOSIS — D509 Iron deficiency anemia, unspecified: Secondary | ICD-10-CM | POA: Diagnosis not present

## 2022-07-04 DIAGNOSIS — I10 Essential (primary) hypertension: Secondary | ICD-10-CM | POA: Diagnosis not present

## 2022-07-04 DIAGNOSIS — J449 Chronic obstructive pulmonary disease, unspecified: Secondary | ICD-10-CM | POA: Diagnosis not present

## 2022-07-11 NOTE — Telephone Encounter (Signed)
Since call, pt was admitted to [facility] in [place]. Closing encounter.

## 2022-07-23 DIAGNOSIS — J449 Chronic obstructive pulmonary disease, unspecified: Secondary | ICD-10-CM | POA: Diagnosis not present

## 2022-07-23 DIAGNOSIS — I10 Essential (primary) hypertension: Secondary | ICD-10-CM | POA: Diagnosis not present

## 2022-07-23 DIAGNOSIS — F32A Depression, unspecified: Secondary | ICD-10-CM | POA: Diagnosis not present

## 2022-07-23 DIAGNOSIS — K219 Gastro-esophageal reflux disease without esophagitis: Secondary | ICD-10-CM | POA: Diagnosis not present

## 2022-07-24 ENCOUNTER — Other Ambulatory Visit (HOSPITAL_COMMUNITY): Payer: Self-pay | Admitting: Family Medicine

## 2022-07-24 DIAGNOSIS — Z431 Encounter for attention to gastrostomy: Secondary | ICD-10-CM

## 2022-07-24 DIAGNOSIS — R1312 Dysphagia, oropharyngeal phase: Secondary | ICD-10-CM | POA: Diagnosis not present

## 2022-07-24 DIAGNOSIS — R41841 Cognitive communication deficit: Secondary | ICD-10-CM | POA: Diagnosis not present

## 2022-07-24 DIAGNOSIS — J449 Chronic obstructive pulmonary disease, unspecified: Secondary | ICD-10-CM | POA: Diagnosis not present

## 2022-07-25 DIAGNOSIS — R1312 Dysphagia, oropharyngeal phase: Secondary | ICD-10-CM | POA: Diagnosis not present

## 2022-07-25 DIAGNOSIS — R41841 Cognitive communication deficit: Secondary | ICD-10-CM | POA: Diagnosis not present

## 2022-07-25 DIAGNOSIS — J449 Chronic obstructive pulmonary disease, unspecified: Secondary | ICD-10-CM | POA: Diagnosis not present

## 2022-07-26 ENCOUNTER — Ambulatory Visit (HOSPITAL_COMMUNITY)
Admission: RE | Admit: 2022-07-26 | Discharge: 2022-07-26 | Disposition: A | Discharge status: Home / Self Care | Payer: BC Managed Care – PPO | Source: Ambulatory Visit | Attending: Family Medicine | Admitting: Family Medicine

## 2022-07-26 DIAGNOSIS — Z431 Encounter for attention to gastrostomy: Secondary | ICD-10-CM | POA: Insufficient documentation

## 2022-07-26 DIAGNOSIS — J449 Chronic obstructive pulmonary disease, unspecified: Secondary | ICD-10-CM | POA: Diagnosis not present

## 2022-07-26 DIAGNOSIS — R41841 Cognitive communication deficit: Secondary | ICD-10-CM | POA: Diagnosis not present

## 2022-07-26 DIAGNOSIS — R1312 Dysphagia, oropharyngeal phase: Secondary | ICD-10-CM | POA: Diagnosis not present

## 2022-07-26 HISTORY — PX: IR GASTROSTOMY TUBE REMOVAL: IMG5492

## 2022-07-26 MED ORDER — LIDOCAINE VISCOUS HCL 2 % MT SOLN
OROMUCOSAL | Status: AC
  Administered 2022-07-26: 10 mL
  Filled 2022-07-26: qty 15

## 2022-07-26 NOTE — Procedures (Signed)
Gastrostomy tube removed per orders. Please see full dictation under imaging tab in Epic.  Soyla Dryer, [place] [phone] 07/26/2022, 12:19 PM

## 2022-07-27 DIAGNOSIS — J449 Chronic obstructive pulmonary disease, unspecified: Secondary | ICD-10-CM | POA: Diagnosis not present

## 2022-07-27 DIAGNOSIS — R41841 Cognitive communication deficit: Secondary | ICD-10-CM | POA: Diagnosis not present

## 2022-07-27 DIAGNOSIS — R1312 Dysphagia, oropharyngeal phase: Secondary | ICD-10-CM | POA: Diagnosis not present

## 2022-07-30 DIAGNOSIS — J449 Chronic obstructive pulmonary disease, unspecified: Secondary | ICD-10-CM | POA: Diagnosis not present

## 2022-07-30 DIAGNOSIS — R1312 Dysphagia, oropharyngeal phase: Secondary | ICD-10-CM | POA: Diagnosis not present

## 2022-07-30 DIAGNOSIS — R41841 Cognitive communication deficit: Secondary | ICD-10-CM | POA: Diagnosis not present

## 2022-07-31 DIAGNOSIS — R1312 Dysphagia, oropharyngeal phase: Secondary | ICD-10-CM | POA: Diagnosis not present

## 2022-07-31 DIAGNOSIS — R41841 Cognitive communication deficit: Secondary | ICD-10-CM | POA: Diagnosis not present

## 2022-07-31 DIAGNOSIS — J449 Chronic obstructive pulmonary disease, unspecified: Secondary | ICD-10-CM | POA: Diagnosis not present

## 2022-08-01 DIAGNOSIS — R1312 Dysphagia, oropharyngeal phase: Secondary | ICD-10-CM | POA: Diagnosis not present

## 2022-08-01 DIAGNOSIS — R41841 Cognitive communication deficit: Secondary | ICD-10-CM | POA: Diagnosis not present

## 2022-08-01 DIAGNOSIS — J449 Chronic obstructive pulmonary disease, unspecified: Secondary | ICD-10-CM | POA: Diagnosis not present

## 2022-08-02 DIAGNOSIS — J449 Chronic obstructive pulmonary disease, unspecified: Secondary | ICD-10-CM | POA: Diagnosis not present

## 2022-08-02 DIAGNOSIS — R1312 Dysphagia, oropharyngeal phase: Secondary | ICD-10-CM | POA: Diagnosis not present

## 2022-08-02 DIAGNOSIS — R41841 Cognitive communication deficit: Secondary | ICD-10-CM | POA: Diagnosis not present

## 2022-08-03 DIAGNOSIS — J449 Chronic obstructive pulmonary disease, unspecified: Secondary | ICD-10-CM | POA: Diagnosis not present

## 2022-08-03 DIAGNOSIS — R1312 Dysphagia, oropharyngeal phase: Secondary | ICD-10-CM | POA: Diagnosis not present

## 2022-08-03 DIAGNOSIS — G629 Polyneuropathy, unspecified: Secondary | ICD-10-CM | POA: Diagnosis not present

## 2022-08-03 DIAGNOSIS — R41841 Cognitive communication deficit: Secondary | ICD-10-CM | POA: Diagnosis not present

## 2022-08-06 DIAGNOSIS — J449 Chronic obstructive pulmonary disease, unspecified: Secondary | ICD-10-CM | POA: Diagnosis not present

## 2022-08-06 DIAGNOSIS — K219 Gastro-esophageal reflux disease without esophagitis: Secondary | ICD-10-CM | POA: Diagnosis not present

## 2022-08-06 DIAGNOSIS — R1312 Dysphagia, oropharyngeal phase: Secondary | ICD-10-CM | POA: Diagnosis not present

## 2022-08-07 DIAGNOSIS — R41841 Cognitive communication deficit: Secondary | ICD-10-CM | POA: Diagnosis not present

## 2022-08-07 DIAGNOSIS — J984 Other disorders of lung: Secondary | ICD-10-CM | POA: Diagnosis not present

## 2022-08-07 DIAGNOSIS — J449 Chronic obstructive pulmonary disease, unspecified: Secondary | ICD-10-CM | POA: Diagnosis not present

## 2022-08-07 DIAGNOSIS — R1312 Dysphagia, oropharyngeal phase: Secondary | ICD-10-CM | POA: Diagnosis not present

## 2022-08-13 DIAGNOSIS — R41841 Cognitive communication deficit: Secondary | ICD-10-CM | POA: Diagnosis not present

## 2022-08-13 DIAGNOSIS — J449 Chronic obstructive pulmonary disease, unspecified: Secondary | ICD-10-CM | POA: Diagnosis not present

## 2022-08-13 DIAGNOSIS — R1312 Dysphagia, oropharyngeal phase: Secondary | ICD-10-CM | POA: Diagnosis not present

## 2022-08-14 DIAGNOSIS — R1312 Dysphagia, oropharyngeal phase: Secondary | ICD-10-CM | POA: Diagnosis not present

## 2022-08-14 DIAGNOSIS — R41841 Cognitive communication deficit: Secondary | ICD-10-CM | POA: Diagnosis not present

## 2022-08-14 DIAGNOSIS — J449 Chronic obstructive pulmonary disease, unspecified: Secondary | ICD-10-CM | POA: Diagnosis not present

## 2022-08-15 DIAGNOSIS — R1312 Dysphagia, oropharyngeal phase: Secondary | ICD-10-CM | POA: Diagnosis not present

## 2022-08-15 DIAGNOSIS — J449 Chronic obstructive pulmonary disease, unspecified: Secondary | ICD-10-CM | POA: Diagnosis not present

## 2022-08-15 DIAGNOSIS — R41841 Cognitive communication deficit: Secondary | ICD-10-CM | POA: Diagnosis not present

## 2022-08-16 DIAGNOSIS — J449 Chronic obstructive pulmonary disease, unspecified: Secondary | ICD-10-CM | POA: Diagnosis not present

## 2022-08-16 DIAGNOSIS — R41841 Cognitive communication deficit: Secondary | ICD-10-CM | POA: Diagnosis not present

## 2022-08-16 DIAGNOSIS — R1312 Dysphagia, oropharyngeal phase: Secondary | ICD-10-CM | POA: Diagnosis not present

## 2022-08-17 DIAGNOSIS — R41841 Cognitive communication deficit: Secondary | ICD-10-CM | POA: Diagnosis not present

## 2022-08-17 DIAGNOSIS — J449 Chronic obstructive pulmonary disease, unspecified: Secondary | ICD-10-CM | POA: Diagnosis not present

## 2022-08-17 DIAGNOSIS — R1312 Dysphagia, oropharyngeal phase: Secondary | ICD-10-CM | POA: Diagnosis not present

## 2022-08-20 DIAGNOSIS — J449 Chronic obstructive pulmonary disease, unspecified: Secondary | ICD-10-CM | POA: Diagnosis not present

## 2022-08-20 DIAGNOSIS — R1312 Dysphagia, oropharyngeal phase: Secondary | ICD-10-CM | POA: Diagnosis not present

## 2022-08-20 DIAGNOSIS — R41841 Cognitive communication deficit: Secondary | ICD-10-CM | POA: Diagnosis not present

## 2022-08-21 DIAGNOSIS — J9811 Atelectasis: Secondary | ICD-10-CM | POA: Diagnosis not present

## 2022-08-21 DIAGNOSIS — J984 Other disorders of lung: Secondary | ICD-10-CM | POA: Diagnosis not present

## 2022-08-22 DIAGNOSIS — F32A Depression, unspecified: Secondary | ICD-10-CM | POA: Diagnosis not present

## 2022-08-22 DIAGNOSIS — F419 Anxiety disorder, unspecified: Secondary | ICD-10-CM | POA: Diagnosis not present

## 2022-08-22 DIAGNOSIS — J449 Chronic obstructive pulmonary disease, unspecified: Secondary | ICD-10-CM | POA: Diagnosis not present

## 2022-08-23 ENCOUNTER — Encounter: Payer: Self-pay | Admitting: Internal Medicine

## 2022-08-23 ENCOUNTER — Ambulatory Visit (INDEPENDENT_AMBULATORY_CARE_PROVIDER_SITE_OTHER): Payer: BC Managed Care – PPO | Admitting: Internal Medicine

## 2022-08-23 VITALS — BP 120/68 | HR 78 | Temp 98.2°F | Ht 70.0 in | Wt 169.0 lb

## 2022-08-23 DIAGNOSIS — Z23 Encounter for immunization: Secondary | ICD-10-CM

## 2022-08-23 DIAGNOSIS — Z8616 Personal history of COVID-19: Secondary | ICD-10-CM | POA: Diagnosis not present

## 2022-08-23 DIAGNOSIS — J449 Chronic obstructive pulmonary disease, unspecified: Secondary | ICD-10-CM

## 2022-08-23 NOTE — Progress Notes (Signed)
#Obesity Body mass index is 39.91 kg/(m^2). on 03/26/2013  #Chronic sinusitis T sinus 2006   - IMPRESSION:  1. Ethmoid and frontal sinus dise Case. Sphenoid and maxillary sinuses are clear.  2. Patent ostiomeatal complexes.  #Smoking history  reports that he quit smoking about 2 years ago. His smoking use included Cigarettes. He has a 35 pack-year smoking history. He does not have any smokeless tobacco history on file.  #Multifactorial dyspnea -  Nuclear med stress test  2013 -  - negative per hx. Done by Dr Einar Gip per hx  #COPD - Severe/very severe COPD Spirometry 12/31/11 done at [facility]- fev1 0.8L/21%, Rati 39, 23% BD response on fev1 May 2013: Walk test in office : Pulse 88% at rest -> 1 lap x 185 feet - pulse ox 88% and HR 110 and very dyspneic, so stopped - Status post pulmonary rehabilitation fall 2013   #Evaluation for lung cancer : CXR 12/24/11   Clear lung fields - personally revieweed  #Recurrent COPD - May 2013: Outpatient treatment with doxycycline and prednisone - August 2013: Hospitalization for COPD exacerbation - January 2014: Outpatient treatment with antibiotics and prednisone - March 2015 : telephoine Rx with sinusitis, abx and prednisone - April 2015: telephoine Rx with abx and prednisone - June 2014 - VDRF with admission to ICU.        OV 06/08/2014  Chief Complaint  Patient presents with   Follow-up    Pt states since using his neb meds his breathing has improved.C/o DOE. Denies CP/tightness and cough.    Followup from recent ICU hospitalization for respiratory failure. At this point in time he is significantly improved. He used to be bothered by significant sinus blockage for which he had an extensive workup including MRI and MRA but apparently after he started using nebulizers instead of inhalers this symptom has resolved. He still physically deconditioned. He did have home physical therapy but he found him down an operative go self-directed  exercises but after my counseling today he is open to reinitiating home physical therapy. This is because he still physically deconditioned. He uses oxygen and nebulizers on a compliant fashion. PFTs today show significant severe Gold stage IV lung disease with FEV1 of 0.92/24% and a ratio of 42. Total lung capacity of 122% and a DLCO that significantly impaired at 8.8/26%.  Labs reviewed June 2040 in the hospital at discharge he was anemic with a hemoglobin of 10 g percent. This has not been followed up. His chemistries were normal.  Past medical history: He did have a sleep study over the weekend. I do not know the results. This was ordered by Dr. Halford Chessman and I will set up followup for this to Dr. Jaclyn Prime 08/22/2015  Chief Complaint  Patient presents with   Follow-up    Pt states his breathing was worse over the summer because of the heat and humidity. Pt c/o night time prod cough with white mucus, right ear pressure, intermittent sinus pressure. Pt denies CP/tightness.    Follow-up chronic respiratory failure with hypoxemia and hypercapnia associated with Gold stage IV COPD and morbid obesity.  In early 2015 he had life-threatening hospitalization from respiratory failure. Last visit in the office was July 2015. After that he failed to follow-up. He now presents with his wife. They want refills of his nebulizers and wants to up-to-date himself with the vaccines. The last 1 year they deny any hospitalizations or emergency room visits or urgent care visits  or new medical diagnoses. He is mostly sedentary and is house sitting but he does do limited activities of daily living such as changing clothes and self-feed. He did travel to the beach but he stayed in the hotel in the beach. Currently he does have some mild baseline cough with yellow sputum but this is unchanged compared to baseline. There are no new issues.   OV 09/05/2016  Chief Complaint  Patient presents with   Follow-up    Pt  breathing is feels slightly worst due to weather. Breathing has changed since last seen. Uses nebulizer after excertion. No tightness, or congestion today. Coughing is sometimes dry and productive. coughing up clear mucus. Discuss flu shot, handy cap plaquer, and rescue inhaler.     Follow-up chronic approximately possibly hypercapnic respiratory failure from Gold stage IV COPD and morbid obesity and sleep apnea ruled out per history  Last seen one year ago. He was supposed to come back in 6 months but did not. Review of the chart suggests that he did not have any hospitalizations or emergency room visits. He and his wife confirmed that. He is extremely sedentary. He spends most of his day in bed playing video games and surfing the Internet. He has the ability to walk from room to room but is limited because of shortness of breath. He requests some help with activities of daily living such as changing clothes or taking a bath. Overall stable. He is asking for an increase in his nebulizer frequency. His upcoming oral surgery evaluation and might require anesthesia for it. Is requesting flu shot.   OV 04/30/2017  Chief Complaint  Patient presents with   Follow-up    FOLLOW UP FOR Chronic respiratory failure with hyercapnia, COPD , severe     Follow-up chronic approximately possibly hypercapnic respiratory failure from Gold stage IV COPD and morbid obesity and sleep apnea ruled out per history   Routine follow-up for this patient with the above medical problems. Overall is doing well. His wife is here with him. There are no new exacerbations or admissions. His son got married in the interim. He did fine with his oral surgery and is now feeling better. He complains of occasional mucus being stuck in his chest but chest x-ray earlier this year was clear. Uses pure mist and able to clear the mucus    OV 01/07/2018  Chief Complaint  Patient presents with   Follow-up    Pt has had elevated WBCs, SOB  is worse today than it has been, coughing at night. Denies any CP. DME: [facility], 2L at rest and 3L with movement.     End-stage COPD with hypoxemic respiratory failure on oxygen.  87-month follow-up but I last saw him in June 2018.  He presents with his wife.  I am meeting his daughter Raquel Sarna for the first time.  Overall they report that he is stable on his nebulizers and oxygen.  Apparently he has had a interim diagnosis of CLL.  Flu shot was held but after seeing hematologist he has been cleared to have a flu shot.  He wants to have a flu shot today.  With the cold weather in the last 2 days in the recent visit to the office today he is a little bit more short of breath than usual but does not think he is in an exacerbation.  COPD CAT score is 27       OV 09/05/2018  Subjective:  Patient ID: Isaac [name], male ,  DOB: 01-16-1956 , age 53 y.o. , MRN: [id number] , ADDRESS: [address] [place] [address]   09/05/2018 -   Chief Complaint  Patient presents with   Follow-up    Doing okay Sob is about the same.     Isaac [name] 66 y.o. -presents for follow-up of his severe COPD and chronic hypoxemic respiratory failure.  He is here with his wife as usual.  In terms of stable.  COPD CAT score is 28.  He tells me that his primary care physician is ill.  He tells me that he would need to call me for sinus issues.  Wife noticed that he is no longer taking his nasal fluticasone and once a refill.  He has been having some toe issues I recommended referral to a podiatrist but he declined.  He wants to have a high-dose flu shot and asked if it was okay because he was only 66 years old.  Otherwise no issues.  While at home awake is able to get by at 2 L or less       OV 11/06/2019 - telephne visit. Limits, risks and benefit explained. Patient identified with 2 person identifier.   Subjective:  Patient ID: Isaac [name], male , DOB: [birth year] , age 83 y.o. , MRN: [id number] , ADDRESS:  [place] [address]   11/06/2019 -   Chief Complaint  Patient presents with   Follow-up    pt reports of sinus pressure (feels this is related to turning heat on) and mild sob with exertion. on 2L cont.      Isaac Isaac [name] 66 y.o. - reports sinuss issues since weather gold cold and has turned heat on. Has vaoprizer and using nebs and alak selzer. But nose is still getting blockd. Breathing is stable. Using flonase and it helps a lot. Not using netti pot. Was thinking about saline nasal rinse. In past has used netti pot. Has had flu shot. Is doing very low risk covid-19 activities only    ROS - per Isaac  08/12/2020  - Visit   66 year old male former smoker followed in our office for COPD and chronic respiratory failure.  He is followed by Dr. Chase Caller.  He was last seen in our office in May/2021 by TP NP.  He is up-to-date with his COVID-19 vaccinations.  His DME company [employer] has notified our office that he needs to requalify for oxygen today and this needs to be reflected in the notes.  We will work on evaluating this.  Patient with known COPD and chronic respiratory failure and currently benefits from being maintained on oxygen.  Patient reports her last antibiotics that were prescribed was in May/2021.  He feels that his breathing is at baseline.  May be slightly worsened shortness of breath.  He is currently maintained on 4 L pulsed O2 with his Energen.  He wears 3 L continuous at night.  He needs to requalify for oxygen today.  He admits that he continues to lead a very sedentary lifestyle.  He does not exercise.  He continues to utilize his nebulized maintenance medications as prescribed.      OV 05/08/2021  Subjective:  Patient ID: Isaac [name], male , DOB: 06-30-1956 , age 42 y.o. , MRN: [id number] , ADDRESS: [place] [id number] [facility] Patient Care Team: [facility] [place] as PCP  - General (Rheumatology)  This Provider for this visit: Treatment Team:  Attending Provider: Brand Males, MD    05/08/2021 -   Chief Complaint  Patient presents with   Follow-up    Pt has been in the hospital since last visit multiple times. Pt's breathing has also become worse since last visit and he has also become much weaker.     Isaac Isaac [name] 66 y.o. -follow-up chronic hypoxemic respiratory failure.  Personally not seen in a few years.  He presents with his wife as always.  His daughter Raquel Sarna is here.  She believes she has met me once but I recall not having met her before.  I think a meeting for the first time but it is possible I met her before.  In the last year and a half or 2 years since I last saw him he has lost significant amount of weight.  He has had multiple admissions including 1 in April 2022 for left-sided pneumonia.  No aspiration history.  His CLL has gotten worse and he is on chemotherapy drug for this.  Even though he is lost weight and has had home physical therapy and Occupational Therapy continues to suffer from significant lack of motivation.  Today is reluctant to get chest x-ray.  I requested blood gas he is reluctant to do that.  We discussed the possibility of BiPAP therapy and he is reluctant for that.  His family says that he has significant loss of motivation.  He was not cooperative with physical therapy either despite having good strength.  He believes he is having a COPD exacerbation and is complaining of wheezing on the left side of his chest and he feels his pneumonia is coming back.  He is interested in antibiotic or prednisone therapy.  However he is somewhat reluctant for chest x-ray.  His current oxygen use is between 3 and 4 L.  PFT  PFT Results Latest Ref Rng & Units 06/08/2014  FVC-Pre L 2.09  FVC-Predicted Pre % 42  FVC-Post L 2.17  FVC-Predicted Post % 44  Pre FEV1/FVC % % 39  Post FEV1/FCV % % 42  FEV1-Pre L 0.83  FEV1-Predicted  Pre % 22  FEV1-Post L 0.90  DLCO uncorrected ml/min/mmHg 8.83  DLCO UNC% % 26  DLVA Predicted % 47    CT chest 03/04/21   IMPRESSION: 1. No evidence of pulmonary embolism. 2. Ground-glass airspace opacities involving the upper lobes bilaterally, the RIGHT LOWER LOBE, and the RIGHT MIDDLE LOBE, likely indicating pneumonia. 3. Extensive lymphadenopathy throughout the visualized lower cervical chains bilaterally, both axilla, the mediastinum, both hila, the retrocrural space and the upper abdomen. Index lymph nodes are measured above. This likely indicates recurrence of the patient's CLL or transformation into lymphoma. 4. Massive splenic enlargement (the spleen is incompletely imaged). 5. Aortic Atherosclerosis (ICD10-I70.0) and Emphysema (ICD10-J43.9).     Electronically Signed   By: Evangeline Dakin M.D.   On: 03/04/2021 18:41     06/05/2021 Follow up : COPD , O2 RF , PNA  Patient returns for a 1 month follow-up.  Patient was seen last visit for a COPD exacerbation and pneumonia.  Chest x-ray showed left basilar pneumonia and small left pleural effusion.  He was treated with doxycycline and a prednisone taper.  History Since last visit patient is feeling better with decreased cough/congestion . Gets winded with minimal activity. Sedentary lifestyle.  Remains Pulmicort Neb Twice daily . Duoneb Four times a day  .  Chest xray today reviewed independently , appears improved with resolved effusion. Minimal left  base atelectasis .  On Oxygen 2l/m , no increased oxygen demands.  Appetite is fair with no nausea vomiting or diarrhea.  No hemoptysis.  Patient is following with ENT for chronic sinus and nasal congestion.  Patient is following with oncology for CLL.  Most recent labs showed WBC count is slowly decreasing on treatment..     OV 07/27/2021  Subjective:  Patient ID: Isaac [name], male , DOB: 1956/06/30 , age 56 y.o. , MRN: [id number] , ADDRESS: [place] [id number] [facility] Patient Care Team: [facility] as PCP - General (Rheumatology)  This Provider for this visit: Treatment Team:  Attending Provider: Brand Males, MD    07/27/2021 -follow-up advanced COPD obesity chronic hypoxic respiratory failure and lack of motivation.  History of pneumonia in April 2022   Isaac Isaac [name] 66 y.o. -last seen by myself in June 22.  After that he see nurse practitioner.  He was so deconditioned.  He also has significant lack of motivation.  Therefore his be bringing him in for close follow-up.  He is now got home palliative care and home physical therapy set up by my nurse practitioner.  The wife and his daughter who are with him say that he spends all his time in bed watching TV and on the laptop.  The only thing he will ever get up and goes to the toilet.  Physical therapy is now making him sit in the chair that is right next to the bed for 30 minutes and a single stretch once a day.  Patient does not feel more motivated than this.  He is also reporting a clicking noise in the left side of his chest when he lies down.  Happens once or twice a week has been going on for couple of months.  When he turns to his side it goes away.  He is wondering what this is.  In April 2022 his CT scan showed scattered infiltrates.  He does have some mucus production.  Otherwise he is compliant with nebulizers and his oxygen.    OCT 8152 66 year old old male former smoker followed for very severe COPD and chronic hypoxic respiratory failure on oxygen Medical history significant for CLL  Today's video visit is for an acute work in . Complains of increased cough and congestion for last 3 days.  Feels that his breathing is not doing as good.  He remains on budesonide nebulizer twice daily and DuoNeb nebulizer 4 times daily.  He is using his flutter valve 2-3 times a day.  He does remain on oxygen 2 to 3 L  daily.  Patient says that he has significant coughing spasms at times.  Gets short of breath with minimal activity.  Patient is followed by palliative care.  Patient says most the time he is bedbound.  Is very sedentary.  Patient was hospitalized last month for a COPD exacerbation with acute on chronic hypercarbic and hypoxic respiratory failure, on arrival to the hospital patient had respiratory arrest requiring bag mask ventilation without CPR.  He did regain consciousness and was placed on BiPAP support.  He was treated with empiric antibiotics, steroids and nebulized bronchodilators.  Chest x-ray showed improving edema/pneumonia.  He did require diuresis for volume overload.  Since discharge patient was starting to feel better until last 3 days ago.   Followed by Palliative care.  Patient wife is at home with him and is his caregiver.  We did discuss  in-home needs and severity of his COPD.  Talked about possible transition to hospice from palliative care.  Patient does have a office visit in person in 2 weeks.  We will discuss further at that visit    OV 08/23/2022  Subjective:  Patient ID: Isaac [name], male , DOB: March 06, 1956 , age 46 y.o. , MRN: [id number] , ADDRESS: [place] [address] [facility] Patient Care Team: [facility], [facility] as PCP - General (Rheumatology)  This Provider for this visit: Treatment Team:  Attending Provider: Brand Males, MD    08/23/2022 -   Chief Complaint  Patient presents with   Follow-up    Pt states he feels like the concentrator from the facility is not working correctly. Since last OV, pt has had covid and had to be intubated multiple times requiring a trach and is now in a facility.   Follow-up advanced COPD chronic hypoxemic and hypercapnic respiratory failure.  Suffers from poor motivation  Isaac Isaac [name] 66 y.o. -presents with his wife.  Since I last saw him almost a year ago  or just over a year ago he got hospitalized in December 2022 with COVID-19.  He survived.  He had 2 tracheostomies.  He was finally decannulated in May 2023.  [facility] [place] and then he went to [place].  He is now in [employer].  At [facility] at [facility].  Wife states he suffers from significant lack of motivation.  He is cachectic.  He is on 4 L nasal cannula.  The tracheostomy stoma is not closing.  The going to see a tracheostomy specialist in [facility].  She says he is short of breath all the time.  They are not able to keep up giving him nebulizers all the time.  His pulse ox anywhere between 89 and 90%.  She is frustrated he is not getting better.  She wants him to get better.  He had a recent chest x-ray date 08/07/2022 and per the report there is some right upper lobe infiltrates.  He was given Levaquin for 1 week.  I do not have any recent comparison because it has been several months since he was last in our health system.  They are agreeable to get a CT scan of the chest.       Results for Isaac [name], Isaac [name] (MRN [id number]) as of 05/08/2021 14:06  Ref. Range 03/07/2021 04:21  FIO2 Unknown 40.00  pH, Arterial Latest Ref Range: 7.350 - 7.450  7.425  pCO2 arterial Latest Ref Range: 32.0 - 48.0 mmHg 49.9 (H)  pO2, Arterial Latest Ref Range: 83.0 - 108.0 mmHg 76.9 (L)     CAT COPD Symptom & Quality of Life Score (GSK trademark) 0 is no burden. 5 is highest burden 01/07/2018  09/05/2018   Never Cough -> Cough all the time 3 1  No phlegm in chest -> Chest is full of phlegm 2 1  No chest tightness -> Chest feels very tight 1 1  No dyspnea for 1 flight stairs/hill -> Very dyspneic for 1 flight of stairs 5 5  No limitations for ADL at home -> Very limited with ADL at home 4 5  Confident leaving home -> Not at all confident leaving home 5 5  Sleep soundly -> Do not sleep soundly because of lung condition 3 5  Lots of Energy -> No energy at all 4 5   TOTAL Score (max 40)  27 28  PFT     Latest Ref Rng & Units 06/08/2014   11:09 AM  PFT Results  FVC-Pre L 2.09   FVC-Predicted Pre % 42   FVC-Post L 2.17   FVC-Predicted Post % 44   Pre FEV1/FVC % % 39   Post FEV1/FCV % % 42   FEV1-Pre L 0.83   FEV1-Predicted Pre % 22   FEV1-Post L 0.90   DLCO uncorrected ml/min/mmHg 8.83   DLCO UNC% % 26   DLVA Predicted % 47        has a past medical history of Acute on chronic respiratory failure with hypoxia ([place]), Anxiety, Chronic lymphocytic leukemia in remission ([place]), COPD (chronic obstructive pulmonary disease) ([place]), COPD, severe ([place]), GERD (gastroesophageal reflux disease), Hematuria, Hyperlipidemia, Hypertension, Insomnia, Left lower lobe pneumonia, Nephrolithiasis, and Tobacco dependence.   reports that he quit smoking about 13 years ago. His smoking use included cigarettes. He has a 35.00 pack-year smoking history. He has never used smokeless tobacco.  Past Surgical History:  Procedure Laterality Date   IR GASTROSTOMY TUBE MOD SED  12/05/2021   IR GASTROSTOMY TUBE REMOVAL  07/26/2022   IR THORACENTESIS ASP PLEURAL SPACE W/IMG GUIDE  01/12/2022   LITHOTRIPSY      Allergies  Allergen Reactions   Codeine Itching and Other (See Comments)    "jittery"   Prozac [Fluoxetine Hcl] Other (See Comments)    confusion    Immunization History  Administered Date(s) Administered   Fluad Quad(high Dose 65+) 08/23/2022   Influenza Split 09/27/2011, 08/20/2012, 07/27/2013   Influenza, High Dose Seasonal PF 09/05/2018   Influenza, Quadrivalent, Recombinant, Inj, Pf 09/25/2019   Influenza,inj,Quad PF,6+ Mos 08/22/2015, 10/26/2016, 01/07/2018, 08/12/2020   Influenza-Unspecified 12/24/2011, 09/06/2012, 10/19/2013, 08/31/2015, 08/15/2016, 10/12/2019   PFIZER(Purple Top)SARS-COV-2 Vaccination 03/23/2020, 04/13/2020   Pneumococcal Conjugate-13 08/22/2015, 03/22/2021   Pneumococcal Polysaccharide-23 07/23/2012, 09/06/2012   Tdap 10/01/2012     Family History  Problem Relation Age of Onset   Heart disease Father    Stroke Mother    Leukemia Brother      Current Outpatient Medications:    albuterol (VENTOLIN HFA) 108 (90 Base) MCG/ACT inhaler, INHALE 2 PUFFS INTO THE LUNGS EVERY 4 HOURS AS NEEDED FOR WHEEZING OR SHORTNESS OF BREATH., Disp: 8.5 g, Rfl: 0   aluminum-magnesium hydroxide-simethicone (MAALOX) 619-509-32 MG/5ML SUSP, Take 30 mLs by mouth every 6 (six) hours as needed., Disp: , Rfl:    budesonide (PULMICORT) 0.5 MG/2ML nebulizer solution, Take 2 mLs (0.5 mg total) by nebulization 2 (two) times daily., Disp: 1 mL, Rfl: 0   busPIRone (BUSPAR) 5 MG tablet, Take 5 mg by mouth 2 (two) times daily., Disp: , Rfl:    cetirizine (ZYRTEC) 10 MG tablet, Take 10 mg by mouth daily., Disp: , Rfl:    clonazePAM (KLONOPIN) 0.5 MG tablet, Take 0.5 mg by mouth 3 (three) times daily as needed., Disp: , Rfl:    famotidine (PEPCID) 40 MG tablet, Take 40 mg by mouth daily., Disp: , Rfl:    ferrous sulfate 325 (65 FE) MG tablet, Take 325 mg by mouth daily with breakfast., Disp: , Rfl:    furosemide (LASIX) 40 MG tablet, Take 40 mg by mouth daily., Disp: , Rfl:    gabapentin (NEURONTIN) 100 MG capsule, Take 100 mg by mouth 3 (three) times daily., Disp: , Rfl:    ipratropium-albuterol (DUONEB) 0.5-2.5 (3) MG/3ML SOLN, Take by nebulization., Disp: , Rfl:    lactulose (CHRONULAC) 10 GM/15ML solution, SMARTSIG:Milliliter(s) By Mouth, Disp: ,  Rfl:    metoprolol tartrate (LOPRESSOR) 25 MG tablet, Place 1 tablet (25 mg total) into feeding tube every 6 (six) hours., Disp: , Rfl:    sertraline (ZOLOFT) 25 MG tablet, Take 25 mg by mouth daily., Disp: , Rfl:    Water For Irrigation, Sterile (FREE WATER) SOLN, Place 200 mLs into feeding tube every 8 (eight) hours., Disp: , Rfl:       Objective:   Vitals:   08/23/22 1118  BP: 120/68  Pulse: 78  Temp: 98.2 F (36.8 C)  TempSrc: Oral  SpO2: 96%  Weight: 169 lb (76.7 kg)  Height: $Remove'5\' 10"'RgDjgMj$  (1.778  m)    Estimated body mass index is 24.25 kg/m as calculated from the following:   Height as of this encounter: $RemoveBeforeD'5\' 10"'[id number]$  (1.778 m).   Weight as of this encounter: 169 lb (76.7 kg).  $Rem'@WEIGHTCHANGE'Grkx$ @  [employer]   08/23/22 1118  Weight: 169 lb (76.7 kg)     Physical ExamGeneral: No distress. Deconitoned , debilitted, sitting in wheel chair Neuro: Alert and Oriented x 3. GCS 15. Speech normal Psych: Pleasant Resp:  Barrel Chest - yes.  Wheeze - no, Crackles - no, No overt respiratory distress CVS: Normal heart sounds. Murmurs - no Ext: Stigmata of Connective Tissue Disease - NO HEENT: Normal upper airway. PEERL +. No post nasal drip. OPEN TRACH STOMA        Assessment:       ICD-10-CM   1. COPD, very severe ([place])  J44.9 CT Chest Wo Contrast    2. History of 2019 novel coronavirus disease (COVID-19)  Z86.16 CT Chest Wo Contrast    3. Need for immunization against influenza  Z23 Flu Vaccine QUAD High Dose(Fluad)    His prognosis very poor especially after having advanced COPD and COVID and having tracheostomy and being chronic critically ill and now physically deconditioned with sarcopenia.  The 1 year mortality in these patients is over 50%.  At this point in time he is getting close to 1 year and he is survived so far but his ECOG is extremely poor.  I am really not sure especially with his lack of motivation how much she can improve.  Currently is on standard bronchodilator therapy which is appropriate.  I really have very little to add other than recommending rehab and him to be motivated.  Nevertheless we will get CT scan of the chest  I have explained this to the wife    Plan:     Patient Instructions  Chronic respiratory failure with hypercapnia (HCC) COPD, very severe ([place]) Hx of covid in dec 2022 with resultant chronic critical illness and physical deconditioniung S/p tracheostomy with decannulation May 2023 but with open stoma Physical deconditioning Lack of  motivation  - stable but dealing with severe physical deconditioning and very poor motivation - cxr with Right upper airsprace disease  Plan - continue 2L [place] o2 and nebs as before -keep trach consult at [facility] - if not satisifed can refer to Marni Griffon - get CT chest without contrast if able - challenging issues for long term recovery ahead - need agggressive PT and eeven with that is tough  - Agree with DNR - high dose flu shot 08/23/2022   [place] - video visit with app after ct chest      SIGNATURE    Dr. Brand Males, M.D., F.C.C.P,  Pulmonary and Critical Care Medicine Staff Physician, [facility] Director - Interstitial Lung Disease  Program  [place] at [place], [place], [address]  Pager: [phone], If no answer or between  15:00h - 7:00h: call [phone] Telephone: [phone]  6:00 PM 08/23/2022

## 2022-08-23 NOTE — Patient Instructions (Addendum)
Chronic respiratory failure with hypercapnia (HCC) COPD, very severe (HCC) Hx of covid in dec 2022 with resultant chronic critical illness and physical deconditioniung S/p tracheostomy with decannulation May 2023 but with open stoma Physical deconditioning Lack of motivation  - stable but dealing with severe physical deconditioning and very poor motivation - cxr with Right upper airsprace disease  Plan - continue 2L [place] o2 and nebs as before -keep trach consult at [facility] - if not satisifed can refer to Isaac [name] - get CT chest without contrast if able - challenging issues for long term recovery ahead - need agggressive PT and eeven with that is tough  - Agree with DNR - high dose flu shot 08/23/2022   [place] - video visit with app after ct chest

## 2022-08-27 DIAGNOSIS — J449 Chronic obstructive pulmonary disease, unspecified: Secondary | ICD-10-CM | POA: Diagnosis not present

## 2022-08-27 DIAGNOSIS — R5381 Other malaise: Secondary | ICD-10-CM | POA: Diagnosis not present

## 2022-08-27 DIAGNOSIS — J9612 Chronic respiratory failure with hypercapnia: Secondary | ICD-10-CM | POA: Diagnosis not present

## 2022-08-27 DIAGNOSIS — Z930 Tracheostomy status: Secondary | ICD-10-CM | POA: Diagnosis not present

## 2022-08-28 ENCOUNTER — Inpatient Hospital Stay: Payer: BC Managed Care – PPO | Admitting: Oncology

## 2022-08-28 ENCOUNTER — Inpatient Hospital Stay: Payer: BC Managed Care – PPO

## 2022-08-28 ENCOUNTER — Telehealth: Payer: Self-pay | Admitting: *Deleted

## 2022-08-28 NOTE — Telephone Encounter (Signed)
Wife called to cancel today's appt. They are going to meet with Hospice this week. Will call back if he decides to reschedule

## 2022-08-31 DIAGNOSIS — J9612 Chronic respiratory failure with hypercapnia: Secondary | ICD-10-CM | POA: Diagnosis not present

## 2022-08-31 DIAGNOSIS — J449 Chronic obstructive pulmonary disease, unspecified: Secondary | ICD-10-CM | POA: Diagnosis not present

## 2022-09-10 DIAGNOSIS — Z430 Encounter for attention to tracheostomy: Secondary | ICD-10-CM | POA: Diagnosis not present

## 2022-09-14 ENCOUNTER — Ambulatory Visit
Admission: RE | Admit: 2022-09-14 | Discharge: 2022-09-14 | Disposition: A | Discharge status: Home / Self Care | Payer: BC Managed Care – PPO | Source: Ambulatory Visit | Attending: Internal Medicine | Admitting: Internal Medicine

## 2022-09-14 DIAGNOSIS — J90 Pleural effusion, not elsewhere classified: Secondary | ICD-10-CM | POA: Diagnosis not present

## 2022-09-14 DIAGNOSIS — J449 Chronic obstructive pulmonary disease, unspecified: Secondary | ICD-10-CM | POA: Diagnosis not present

## 2022-09-14 DIAGNOSIS — I700 Atherosclerosis of aorta: Secondary | ICD-10-CM | POA: Diagnosis not present

## 2022-09-14 DIAGNOSIS — G629 Polyneuropathy, unspecified: Secondary | ICD-10-CM | POA: Diagnosis not present

## 2022-09-14 DIAGNOSIS — D509 Iron deficiency anemia, unspecified: Secondary | ICD-10-CM | POA: Diagnosis not present

## 2022-09-14 DIAGNOSIS — Z8616 Personal history of COVID-19: Secondary | ICD-10-CM

## 2022-09-14 DIAGNOSIS — R911 Solitary pulmonary nodule: Secondary | ICD-10-CM | POA: Diagnosis not present

## 2022-09-14 DIAGNOSIS — Z9981 Dependence on supplemental oxygen: Secondary | ICD-10-CM | POA: Diagnosis not present

## 2022-09-17 DIAGNOSIS — R1312 Dysphagia, oropharyngeal phase: Secondary | ICD-10-CM | POA: Diagnosis not present

## 2022-09-17 DIAGNOSIS — J449 Chronic obstructive pulmonary disease, unspecified: Secondary | ICD-10-CM | POA: Diagnosis not present

## 2022-09-17 DIAGNOSIS — F419 Anxiety disorder, unspecified: Secondary | ICD-10-CM | POA: Diagnosis not present

## 2022-09-17 DIAGNOSIS — J984 Other disorders of lung: Secondary | ICD-10-CM | POA: Diagnosis not present

## 2022-09-17 DIAGNOSIS — D509 Iron deficiency anemia, unspecified: Secondary | ICD-10-CM | POA: Diagnosis not present

## 2022-09-19 ENCOUNTER — Other Ambulatory Visit: Payer: Self-pay

## 2022-09-19 ENCOUNTER — Inpatient Hospital Stay: Payer: BC Managed Care – PPO | Attending: Oncology

## 2022-09-19 ENCOUNTER — Inpatient Hospital Stay (HOSPITAL_BASED_OUTPATIENT_CLINIC_OR_DEPARTMENT_OTHER): Payer: BC Managed Care – PPO | Admitting: Oncology

## 2022-09-19 VITALS — BP 108/78 | HR 66 | Temp 99.0°F | Wt 181.3 lb

## 2022-09-19 DIAGNOSIS — C9110 Chronic lymphocytic leukemia of B-cell type not having achieved remission: Secondary | ICD-10-CM | POA: Diagnosis not present

## 2022-09-19 DIAGNOSIS — E883 Tumor lysis syndrome: Secondary | ICD-10-CM | POA: Diagnosis not present

## 2022-09-19 DIAGNOSIS — Z7951 Long term (current) use of inhaled steroids: Secondary | ICD-10-CM | POA: Insufficient documentation

## 2022-09-19 DIAGNOSIS — R079 Chest pain, unspecified: Secondary | ICD-10-CM | POA: Diagnosis not present

## 2022-09-19 DIAGNOSIS — Z79899 Other long term (current) drug therapy: Secondary | ICD-10-CM | POA: Insufficient documentation

## 2022-09-19 LAB — CMP (CANCER CENTER ONLY)
ALT: 8 U/L (ref 0–44)
AST: 13 U/L — ABNORMAL LOW (ref 15–41)
Albumin: 3.7 g/dL (ref 3.5–5.0)
Alkaline Phosphatase: 85 U/L (ref 38–126)
Anion gap: 9 (ref 5–15)
BUN: 29 mg/dL — ABNORMAL HIGH (ref 8–23)
CO2: 28 mmol/L (ref 22–32)
Calcium: 8.5 mg/dL — ABNORMAL LOW (ref 8.9–10.3)
Chloride: 104 mmol/L (ref 98–111)
Creatinine: 1.5 mg/dL — ABNORMAL HIGH (ref 0.61–1.24)
GFR, Estimated: 51 mL/min — ABNORMAL LOW (ref >60)
Glucose, Bld: 98 mg/dL (ref 70–99)
Potassium: 4.7 mmol/L (ref 3.5–5.1)
Sodium: 141 mmol/L (ref 135–145)
Total Bilirubin: 0.6 mg/dL (ref 0.3–1.2)
Total Protein: 6 g/dL — ABNORMAL LOW (ref 6.5–8.1)

## 2022-09-19 LAB — CBC WITH DIFFERENTIAL (CANCER CENTER ONLY)
Abs Immature Granulocytes: 0.23 10*3/uL — ABNORMAL HIGH (ref 0.00–0.07)
Basophils Absolute: 0.1 10*3/uL (ref 0.0–0.1)
Basophils Relative: 1 %
Eosinophils Absolute: 0.4 10*3/uL (ref 0.0–0.5)
Eosinophils Relative: 8 %
HCT: 32.3 % — ABNORMAL LOW (ref 39.0–52.0)
Hemoglobin: 9.9 g/dL — ABNORMAL LOW (ref 13.0–17.0)
Immature Granulocytes: 4 %
Lymphocytes Relative: 32 %
Lymphs Abs: 1.7 10*3/uL (ref 0.7–4.0)
MCH: 25.6 pg — ABNORMAL LOW (ref 26.0–34.0)
MCHC: 30.7 g/dL (ref 30.0–36.0)
MCV: 83.7 fL (ref 80.0–100.0)
Monocytes Absolute: 0.6 10*3/uL (ref 0.1–1.0)
Monocytes Relative: 11 %
Neutro Abs: 2.4 10*3/uL (ref 1.7–7.7)
Neutrophils Relative %: 44 %
Platelet Count: 156 10*3/uL (ref 150–400)
RBC: 3.86 MIL/uL — ABNORMAL LOW (ref 4.22–5.81)
RDW: 18.6 % — ABNORMAL HIGH (ref 11.5–15.5)
WBC Count: 5.4 10*3/uL (ref 4.0–10.5)
nRBC: 0 % (ref 0.0–0.2)

## 2022-09-19 LAB — PHOSPHORUS: Phosphorus: 4.9 mg/dL — ABNORMAL HIGH (ref 2.5–4.6)

## 2022-09-19 LAB — URIC ACID: Uric Acid, Serum: 9.5 mg/dL — ABNORMAL HIGH (ref 3.7–8.6)

## 2022-09-19 NOTE — Progress Notes (Signed)
Hematology and Oncology Follow Up for Telemedicine Visits  Isaac [name] [id number] [birth year] 66 y.o. 09/19/2022 1:48 PM Associates, [facility], [place] *      Principle Diagnosis: 66 year old man with CLL presented with lymphocytosis and adenopathy in 2019.        Current therapy: Imbruvica 420 mg daily started in May 2022.  He has been off treatment since June 2023.    Interim History: Isaac [name] returns today for follow-up visit.  Since the last visit, he underwent a prolonged hospitalization for acute respiratory failure in December 2022 and required long term care facility and tracheostomy.  After his tracheostomy has been removed he is currently residing in a skilled nursing facility in [facility].  The meantime his Kate Sable has been discontinued and has not been on it since June 2023.  Clinically, he reports feeling reasonably fair without any major complaints.  He has lost significant amount of weight and does ambulate with the help of a wheelchair.  He is able to swallow pills that are crushed at this time.  He denies any lymphadenopathy, fevers or chills or sweats.    Medications: Updated on review. Current Outpatient Medications  Medication Sig Dispense Refill   albuterol (VENTOLIN HFA) 108 (90 Base) MCG/ACT inhaler INHALE 2 PUFFS INTO THE LUNGS EVERY 4 HOURS AS NEEDED FOR WHEEZING OR SHORTNESS OF BREATH. 8.5 g 0   aluminum-magnesium hydroxide-simethicone (MAALOX) 762-263-33 MG/5ML SUSP Take 30 mLs by mouth every 6 (six) hours as needed.     budesonide (PULMICORT) 0.5 MG/2ML nebulizer solution Take 2 mLs (0.5 mg total) by nebulization 2 (two) times daily. 1 mL 0   busPIRone (BUSPAR) 5 MG tablet Take 5 mg by mouth 2 (two) times daily.     cetirizine (ZYRTEC) 10 MG tablet Take 10 mg by mouth daily.     clonazePAM (KLONOPIN) 0.5 MG tablet Take 0.5 mg by mouth 3 (three) times daily as needed.     famotidine (PEPCID) 40 MG tablet Take 40 mg by mouth  daily.     ferrous sulfate 325 (65 FE) MG tablet Take 325 mg by mouth daily with breakfast.     furosemide (LASIX) 40 MG tablet Take 40 mg by mouth daily.     gabapentin (NEURONTIN) 100 MG capsule Take 100 mg by mouth 3 (three) times daily.     ipratropium-albuterol (DUONEB) 0.5-2.5 (3) MG/3ML SOLN Take by nebulization.     lactulose (CHRONULAC) 10 GM/15ML solution SMARTSIG:Milliliter(s) By Mouth     metoprolol tartrate (LOPRESSOR) 25 MG tablet Place 1 tablet (25 mg total) into feeding tube every 6 (six) hours.     sertraline (ZOLOFT) 25 MG tablet Take 25 mg by mouth daily.     Water For Irrigation, Sterile (FREE WATER) SOLN Place 200 mLs into feeding tube every 8 (eight) hours.     No current facility-administered medications for this visit.     Allergies:  Allergies  Allergen Reactions   Codeine Itching and Other (See Comments)    "jittery"   Prozac [Fluoxetine Hcl] Other (See Comments)    confusion   Physical exam  Blood pressure 108/78, pulse 66, temperature 99 F (37.2 C), temperature source Temporal, weight 181 lb 4.8 oz (82.2 kg), SpO2 98 %.   ECOG 2    General appearance: Alert, awake without any distress. Head: Atraumatic without abnormalities Oropharynx: Without any thrush or ulcers. Eyes: No scleral icterus. Lymph nodes: No lymphadenopathy noted in the cervical, supraclavicular, or axillary nodes Heart:regular rate and  rhythm, without any murmurs or gallops.   Lung: Clear to auscultation without any rhonchi, wheezes or dullness to percussion. Abdomin: Soft, nontender without any shifting dullness or ascites. Musculoskeletal: No clubbing or cyanosis. Neurological: No motor or sensory deficits. Skin: No rashes or lesions.      Lab Results: Lab Results  Component Value Date   WBC 3.4 (L) 01/17/2022   HGB 8.8 (L) 01/17/2022   HCT 31.1 (L) 01/17/2022   MCV 83.2 01/17/2022   PLT 100 (L) 01/17/2022     Chemistry      Component Value Date/Time   NA 141  01/17/2022 0431   K 4.1 01/17/2022 0431   CL 95 (L) 01/17/2022 0431   CO2 39 (H) 01/17/2022 0431   BUN 27 (H) 01/17/2022 0431   CREATININE 0.73 01/17/2022 0431   CREATININE 1.01 10/27/2021 1500      Component Value Date/Time   CALCIUM 8.6 (L) 01/17/2022 0431   ALKPHOS 94 12/24/2021 0453   AST 31 12/24/2021 0453   AST 18 10/27/2021 1500   ALT 35 12/24/2021 0453   ALT 22 10/27/2021 1500   BILITOT 0.5 12/24/2021 0453   BILITOT 0.8 10/27/2021 1500         Impression and Plan:  66 year old man:   1.      Stage I CLL presented with adenopathy and lymphocytosis diagnosed in 2019.     The natural course of this disease and treatment choices were reviewed.  He tolerated Imbruvica without any major complications.  Alternative treatment options including, appointments although he is not a candidate for aggressive chemotherapy regimen.  After discussion today he is agreeable to resume his treatment.  Complications that include bleeding, palpitation among others were reviewed.  Laboratory data from today reviewed which showed no evidence of lymphocytosis.  Based on these findings I recommend to continue to hold off Imbruvica and repeat laboratory testing in 4 months.   2.  Tumor lysis syndrome: His kidney function is back to normal range.  No evidence of tumor lysis.     3  Follow-up: In 4 months for repeat follow-up.    30  minutes were dedicated to this encounter.  The time was spent on reviewing laboratory data, status update and outlining future plan of care discussion.    Zola Button, MD 09/19/2022 1:48 PM

## 2022-10-10 DIAGNOSIS — J9504 Tracheo-esophageal fistula following tracheostomy: Secondary | ICD-10-CM | POA: Diagnosis not present

## 2022-10-10 DIAGNOSIS — J340 Abscess, furuncle and carbuncle of nose: Secondary | ICD-10-CM | POA: Diagnosis not present

## 2022-10-23 ENCOUNTER — Telehealth: Payer: Self-pay | Admitting: Internal Medicine

## 2022-10-23 NOTE — Telephone Encounter (Signed)
Okay to get the COVID booster    Allergies  Allergen Reactions   Codeine Itching and Other (See Comments)    "jittery"   Prozac [Fluoxetine Hcl] Other (See Comments)    confusion

## 2022-10-23 NOTE — Telephone Encounter (Signed)
MR, please advise if you think it will be okay for pt to receive a covid booster or if he should hold off on it.

## 2022-10-23 NOTE — Telephone Encounter (Signed)
Called and spoke with pt's spouse letting her know that MR said it was okay for pt to get the covid booster and she verbalized understanding. Nothing further needed.

## 2022-11-03 DIAGNOSIS — Z743 Need for continuous supervision: Secondary | ICD-10-CM | POA: Diagnosis not present

## 2022-11-03 DIAGNOSIS — J440 Chronic obstructive pulmonary disease with acute lower respiratory infection: Secondary | ICD-10-CM | POA: Diagnosis not present

## 2022-11-03 DIAGNOSIS — I491 Atrial premature depolarization: Secondary | ICD-10-CM | POA: Diagnosis not present

## 2022-11-03 DIAGNOSIS — J449 Chronic obstructive pulmonary disease, unspecified: Secondary | ICD-10-CM | POA: Diagnosis not present

## 2022-11-03 DIAGNOSIS — R6889 Other general symptoms and signs: Secondary | ICD-10-CM | POA: Diagnosis not present

## 2022-11-03 DIAGNOSIS — Z8616 Personal history of COVID-19: Secondary | ICD-10-CM | POA: Diagnosis not present

## 2022-11-03 DIAGNOSIS — L89322 Pressure ulcer of left buttock, stage 2: Secondary | ICD-10-CM | POA: Diagnosis not present

## 2022-11-03 DIAGNOSIS — J90 Pleural effusion, not elsewhere classified: Secondary | ICD-10-CM | POA: Diagnosis not present

## 2022-11-03 DIAGNOSIS — N200 Calculus of kidney: Secondary | ICD-10-CM | POA: Diagnosis not present

## 2022-11-03 DIAGNOSIS — Z515 Encounter for palliative care: Secondary | ICD-10-CM | POA: Diagnosis not present

## 2022-11-03 DIAGNOSIS — J9621 Acute and chronic respiratory failure with hypoxia: Secondary | ICD-10-CM | POA: Diagnosis not present

## 2022-11-03 DIAGNOSIS — I445 Left posterior fascicular block: Secondary | ICD-10-CM | POA: Diagnosis not present

## 2022-11-03 DIAGNOSIS — J1569 Pneumonia due to other gram-negative bacteria: Secondary | ICD-10-CM | POA: Diagnosis not present

## 2022-11-03 DIAGNOSIS — I452 Bifascicular block: Secondary | ICD-10-CM | POA: Diagnosis not present

## 2022-11-03 DIAGNOSIS — I110 Hypertensive heart disease with heart failure: Secondary | ICD-10-CM | POA: Diagnosis not present

## 2022-11-03 DIAGNOSIS — R0902 Hypoxemia: Secondary | ICD-10-CM | POA: Diagnosis not present

## 2022-11-03 DIAGNOSIS — I499 Cardiac arrhythmia, unspecified: Secondary | ICD-10-CM | POA: Diagnosis not present

## 2022-11-03 DIAGNOSIS — I5032 Chronic diastolic (congestive) heart failure: Secondary | ICD-10-CM | POA: Diagnosis not present

## 2022-11-03 DIAGNOSIS — C9111 Chronic lymphocytic leukemia of B-cell type in remission: Secondary | ICD-10-CM | POA: Diagnosis not present

## 2022-11-03 DIAGNOSIS — Z66 Do not resuscitate: Secondary | ICD-10-CM | POA: Diagnosis not present

## 2022-11-03 DIAGNOSIS — I493 Ventricular premature depolarization: Secondary | ICD-10-CM | POA: Diagnosis not present

## 2022-11-03 DIAGNOSIS — F32A Depression, unspecified: Secondary | ICD-10-CM | POA: Diagnosis not present

## 2022-11-03 DIAGNOSIS — R0602 Shortness of breath: Secondary | ICD-10-CM | POA: Diagnosis not present

## 2022-11-03 DIAGNOSIS — R5381 Other malaise: Secondary | ICD-10-CM | POA: Diagnosis not present

## 2022-11-03 DIAGNOSIS — I4510 Unspecified right bundle-branch block: Secondary | ICD-10-CM | POA: Diagnosis not present

## 2022-11-03 DIAGNOSIS — N189 Chronic kidney disease, unspecified: Secondary | ICD-10-CM | POA: Diagnosis not present

## 2022-11-03 DIAGNOSIS — A4159 Other Gram-negative sepsis: Secondary | ICD-10-CM | POA: Diagnosis not present

## 2022-11-03 DIAGNOSIS — J9622 Acute and chronic respiratory failure with hypercapnia: Secondary | ICD-10-CM | POA: Diagnosis not present

## 2022-11-03 DIAGNOSIS — J9509 Other tracheostomy complication: Secondary | ICD-10-CM | POA: Diagnosis not present

## 2022-11-03 DIAGNOSIS — I472 Ventricular tachycardia, unspecified: Secondary | ICD-10-CM | POA: Diagnosis not present

## 2022-11-03 DIAGNOSIS — R6520 Severe sepsis without septic shock: Secondary | ICD-10-CM | POA: Diagnosis not present

## 2022-11-03 DIAGNOSIS — L89321 Pressure ulcer of left buttock, stage 1: Secondary | ICD-10-CM | POA: Diagnosis not present

## 2022-11-03 DIAGNOSIS — K7689 Other specified diseases of liver: Secondary | ICD-10-CM | POA: Diagnosis not present

## 2022-11-03 DIAGNOSIS — I130 Hypertensive heart and chronic kidney disease with heart failure and stage 1 through stage 4 chronic kidney disease, or unspecified chronic kidney disease: Secondary | ICD-10-CM | POA: Diagnosis not present

## 2022-11-03 DIAGNOSIS — U071 COVID-19: Secondary | ICD-10-CM | POA: Diagnosis not present

## 2022-11-12 DIAGNOSIS — J9504 Tracheo-esophageal fistula following tracheostomy: Secondary | ICD-10-CM | POA: Diagnosis not present

## 2022-11-12 DIAGNOSIS — J449 Chronic obstructive pulmonary disease, unspecified: Secondary | ICD-10-CM | POA: Diagnosis not present

## 2022-11-12 DIAGNOSIS — I10 Essential (primary) hypertension: Secondary | ICD-10-CM | POA: Diagnosis not present

## 2022-11-12 DIAGNOSIS — D509 Iron deficiency anemia, unspecified: Secondary | ICD-10-CM | POA: Diagnosis not present

## 2022-12-07 ENCOUNTER — Telehealth: Payer: Self-pay | Admitting: Oncology

## 2022-12-07 NOTE — Telephone Encounter (Signed)
Called patient regarding providers departure, patient has been notified. Rescheduled patient with new provider.

## 2022-12-21 ENCOUNTER — Telehealth: Payer: Self-pay | Admitting: Hematology

## 2022-12-21 NOTE — Telephone Encounter (Signed)
Called patient per 1/25 in basket message r/s to new patient slot on same appointment day. Left voicemail with new appointment time.

## 2023-01-23 ENCOUNTER — Other Ambulatory Visit: Payer: BC Managed Care – PPO

## 2023-01-23 ENCOUNTER — Ambulatory Visit: Payer: BC Managed Care – PPO | Admitting: Oncology

## 2023-01-25 ENCOUNTER — Inpatient Hospital Stay: Payer: Medicare Other

## 2023-01-25 ENCOUNTER — Other Ambulatory Visit: Payer: BC Managed Care – PPO

## 2023-01-25 ENCOUNTER — Inpatient Hospital Stay: Payer: Medicare Other | Admitting: Hematology

## 2023-01-25 ENCOUNTER — Ambulatory Visit: Payer: BC Managed Care – PPO | Admitting: Hematology

## 2023-02-27 ENCOUNTER — Telehealth: Payer: Self-pay | Admitting: Hematology

## 2023-02-27 NOTE — Telephone Encounter (Signed)
patient in hospital, patients wife will call back to reschedule

## 2023-03-04 ENCOUNTER — Inpatient Hospital Stay: Payer: Medicare Other | Admitting: Hematology

## 2023-03-04 ENCOUNTER — Inpatient Hospital Stay: Payer: Medicare Other

## 2023-08-27 DEATH — deceased

## 2023-10-22 IMAGING — DX DG CHEST 1V PORT
1 series · 1 of 1 positions shown · non-contrast
Comparison: 11/15/2021

CLINICAL DATA: Pneumonia.

EXAM:
PORTABLE CHEST 1 VIEW

[chest ap]
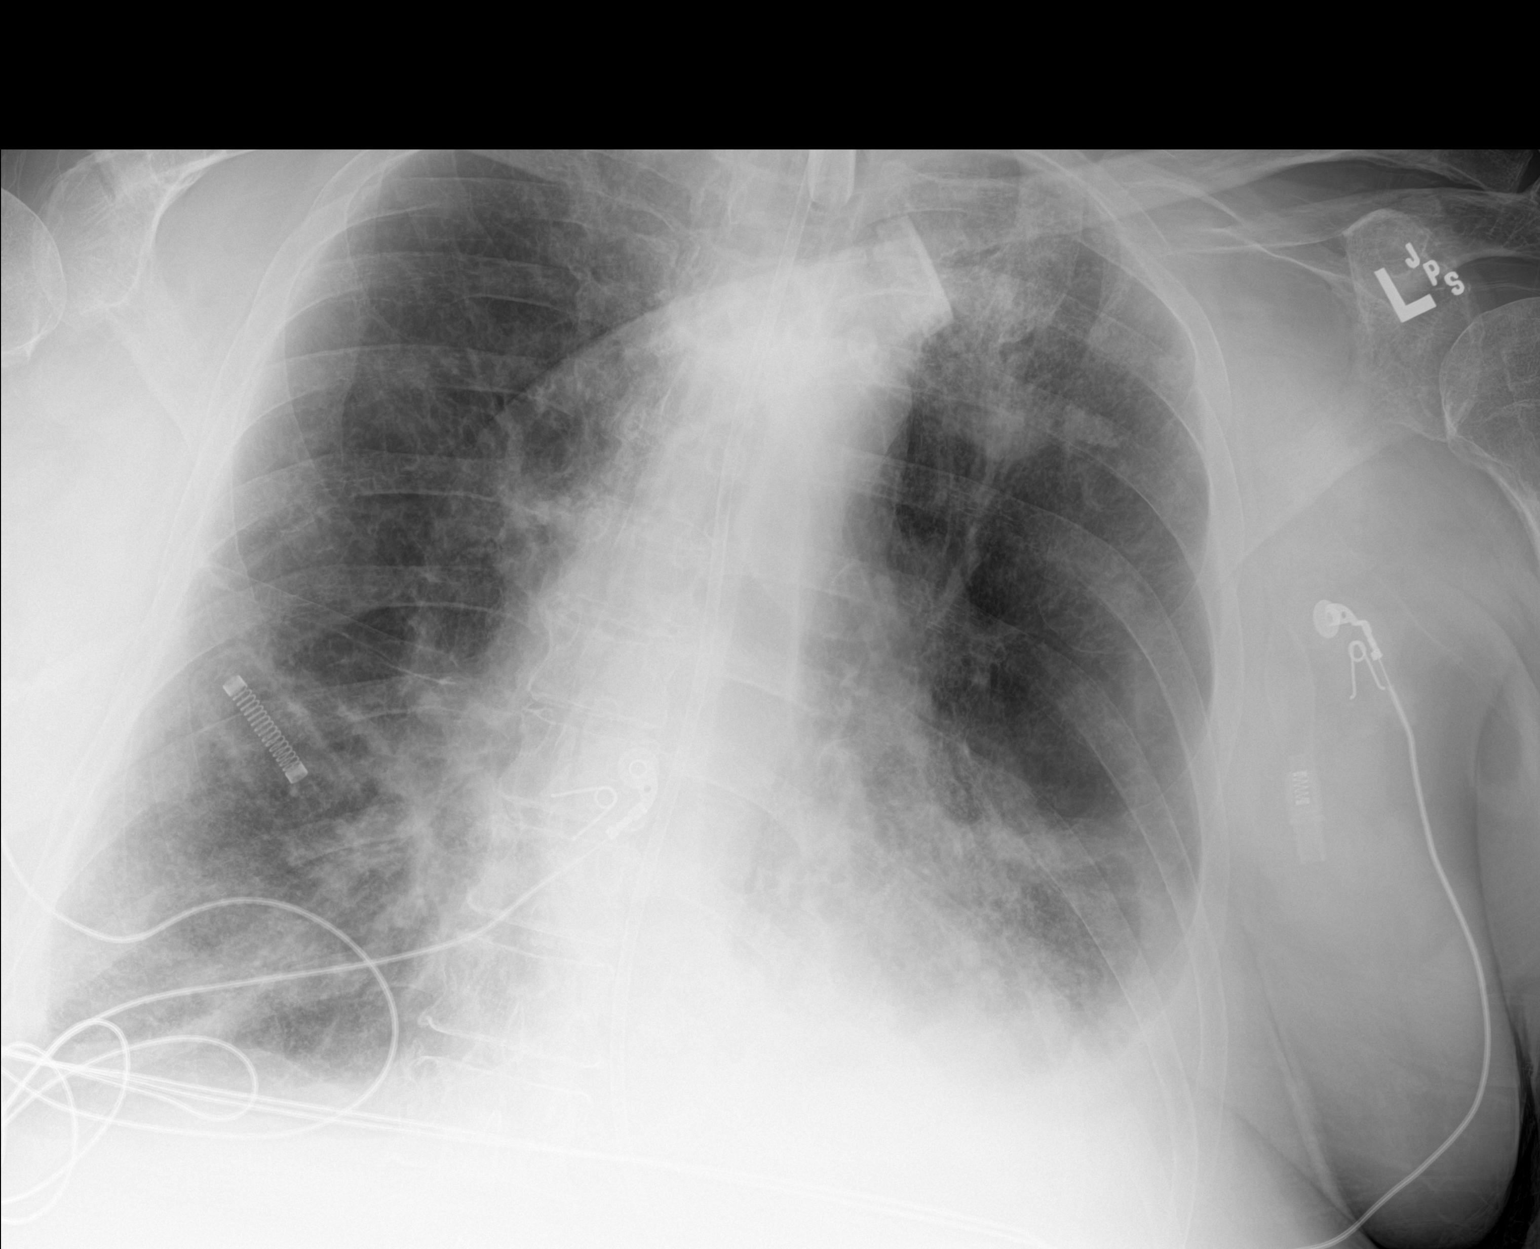

[1 of 1 positions shown; findings below may reference images not displayed]

FINDINGS: 5021 hours. Persistent bibasilar airspace disease, stable on the
right and mildly improved on the left. Small left pleural effusion
again noted. Interstitial markings are diffusely coarsened with
chronic features. The cardio pericardial silhouette is enlarged.
Tracheostomy tube remains in place. A feeding tube passes into the
stomach although the distal tip position is not included on the
film. Telemetry leads overlie the chest.
IMPRESSION: 1. Persistent bibasilar airspace disease, stable on the right and
mildly improved on the left.
2. Small left pleural effusion.

## 2023-10-25 IMAGING — DX DG ABDOMEN 1V
2 series · 2 of 2 positions shown · non-contrast
Comparison: 11/15/2021 and earlier.

CLINICAL DATA: 65-year-old male NG tube placement.

EXAM:
ABDOMEN - 1 VIEW

[abdomen kub (1 of 2)]
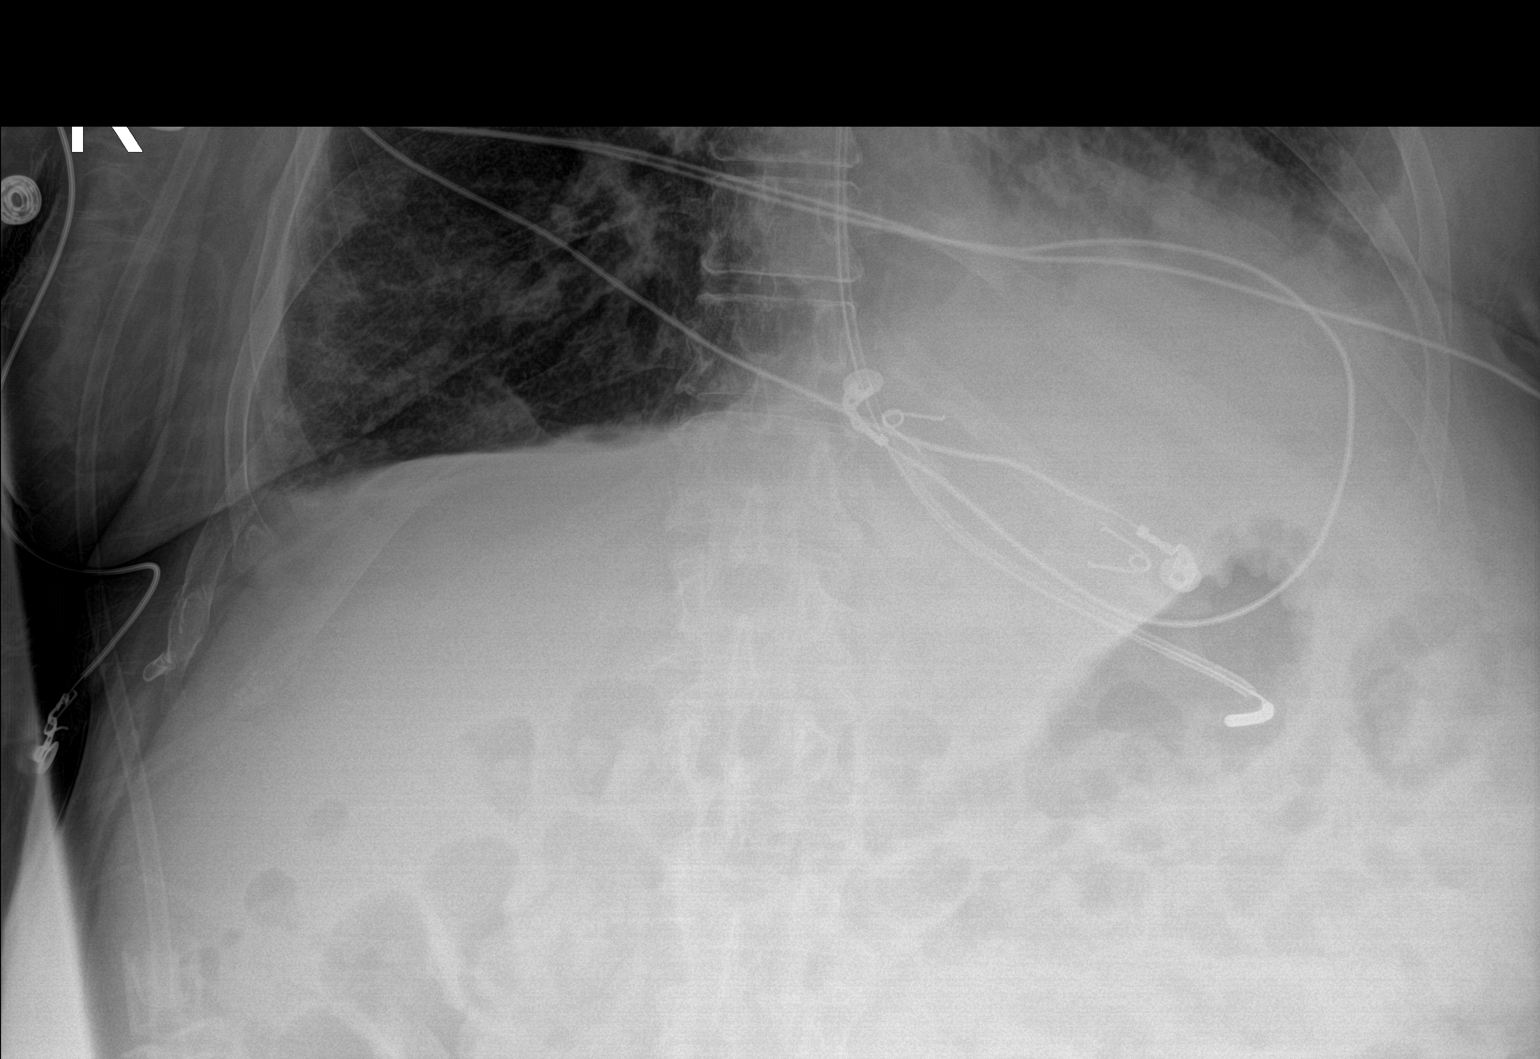

[abdomen kub (2 of 2)]
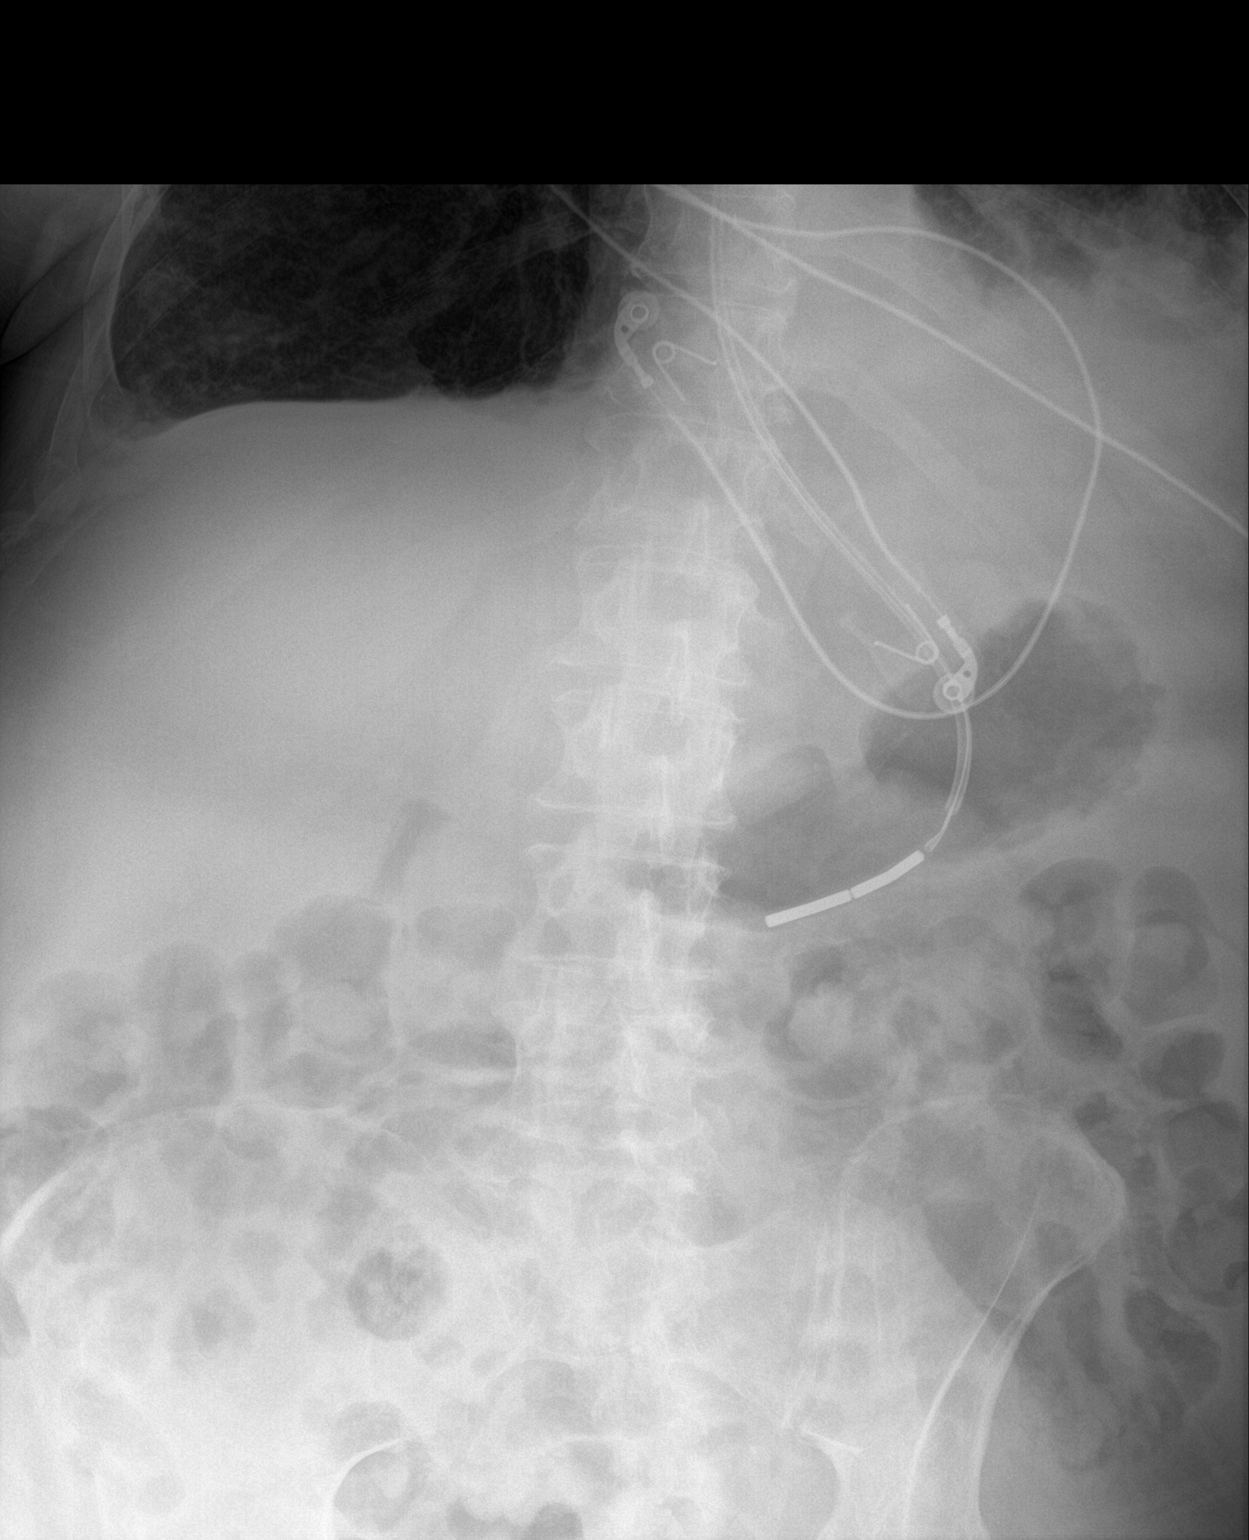

[2 of 2 positions shown; findings below may reference images not displayed]

FINDINGS: Portable AP semi upright view at 0003 hours. Enteric feeding tube
placed into the stomach. Tip is at the distal gastric body on image
#2, approximately 3 cm to the left of midline. Ongoing opacified
left lung base. Negative visible bowel gas pattern. No acute osseous
abnormality identified.
IMPRESSION: Enteric feeding tube placed into the stomach, tip at the distal
gastric body. Advance at least 6 cm to allow for post pyloric
transit if desired.

## 2023-10-26 IMAGING — CT CT ABDOMEN W/O CM
2 of 4 series · 14 of 46 positions shown, 16 images · non-contrast
Comparison: None.

CLINICAL DATA: Evaluate gastric anatomy prior to potential
percutaneous gastrostomy tube placement.

EXAM:
CT ABDOMEN WITHOUT CONTRAST
TECHNIQUE: Multidetector CT imaging of the abdomen was performed following the
standard protocol without IV contrast.

[Series 3: a/p w/o 5mm · axial · non-contrast · 0.84mm/px · z∈[-982,-712]mm · 11 of 66 slices shown, 13 images]
[im 6/66  soft-tissue]
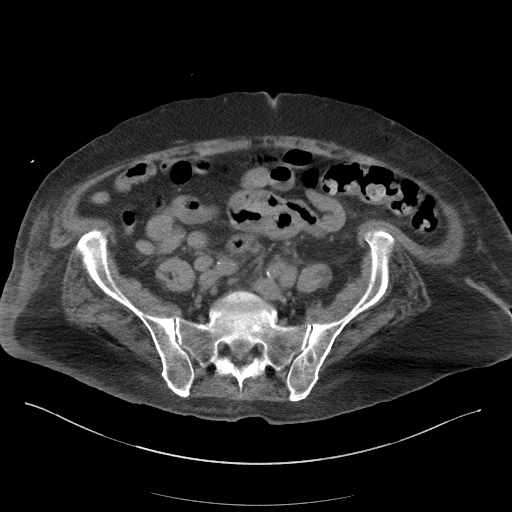
[im 6/66  bone]
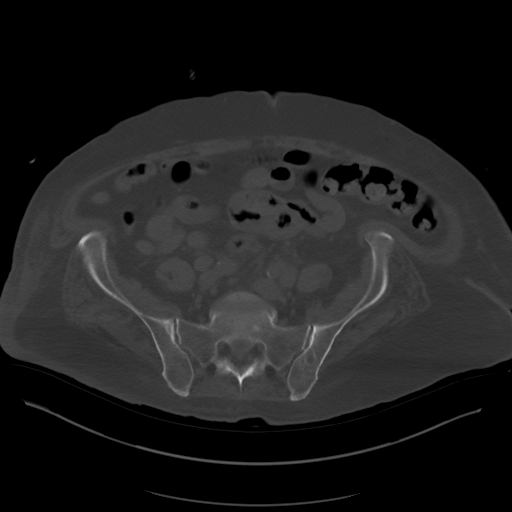
[im 11/66  soft-tissue]
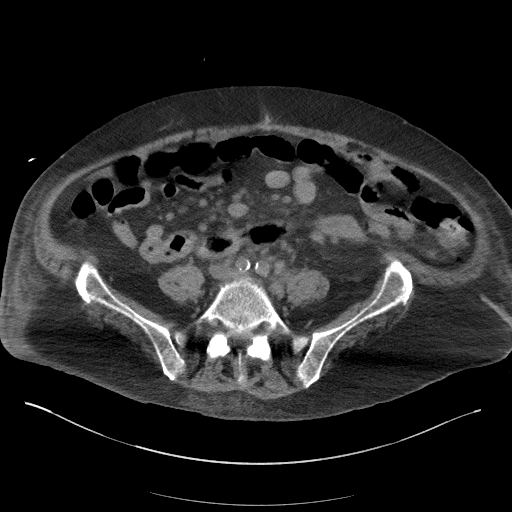
[im 16/66  soft-tissue]
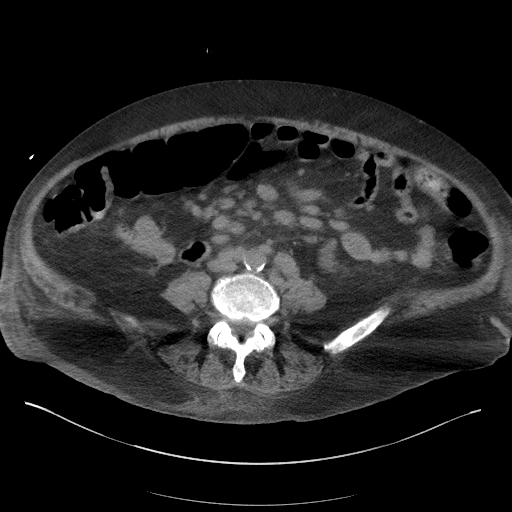
[im 21/66  soft-tissue]
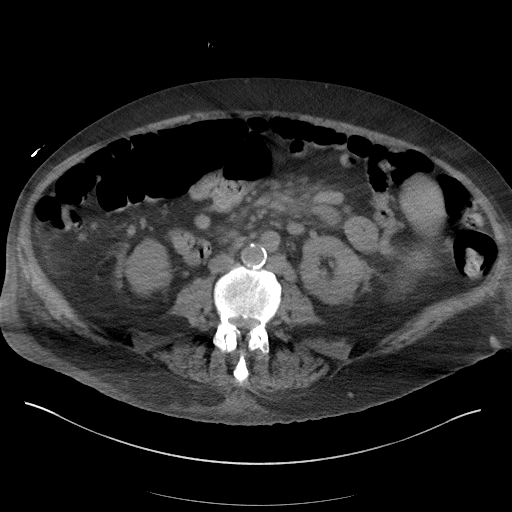
[im 27/66  soft-tissue]
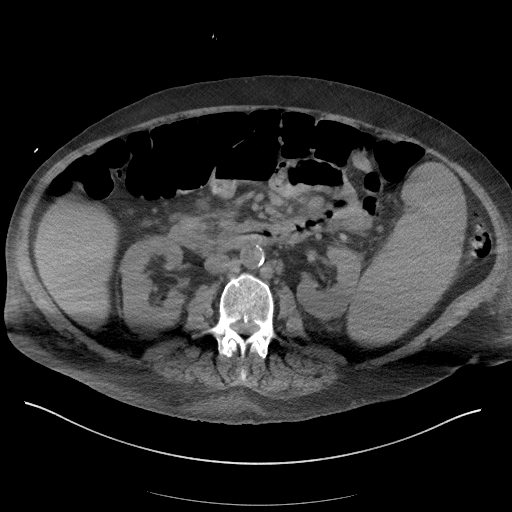
[im 34/66  soft-tissue]
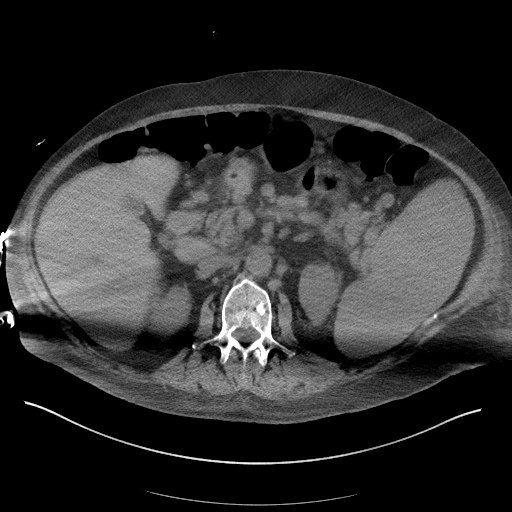
[im 40/66  soft-tissue]
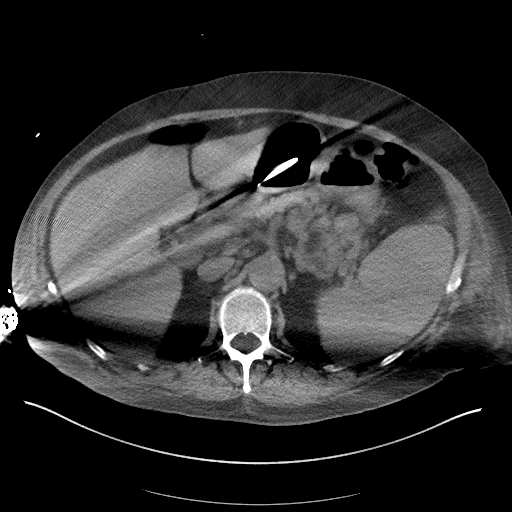
[im 45/66  soft-tissue]
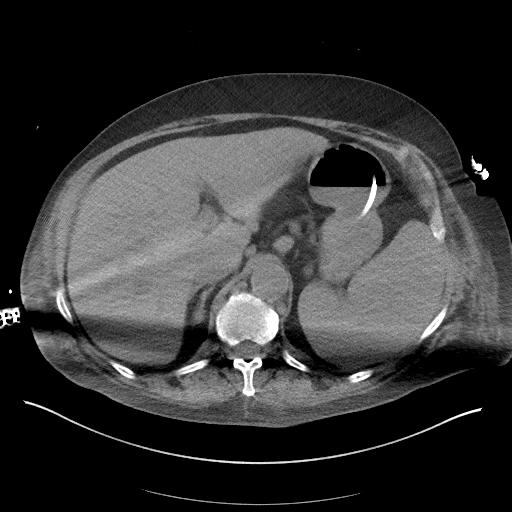
[im 50/66  soft-tissue]
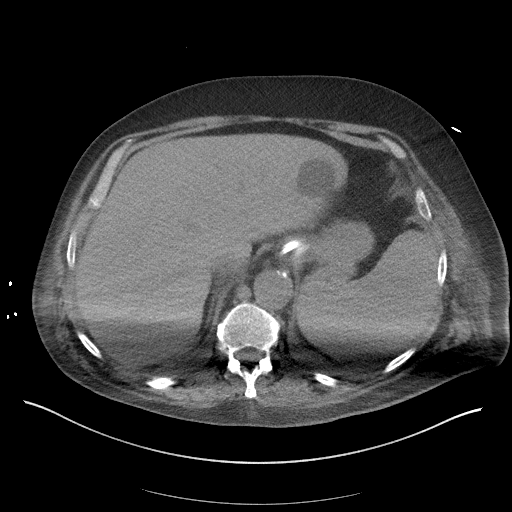
[im 50/66  bone]
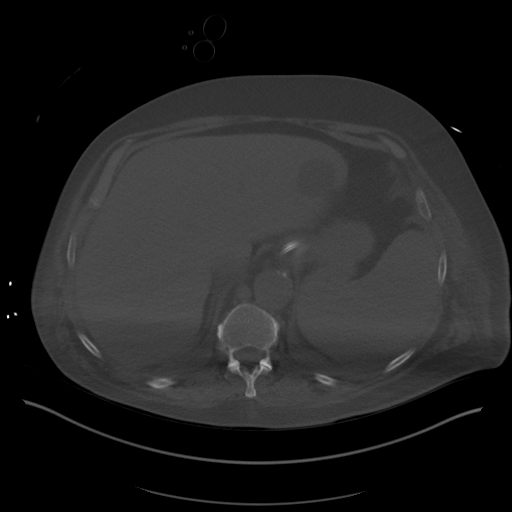
[im 55/66  soft-tissue]
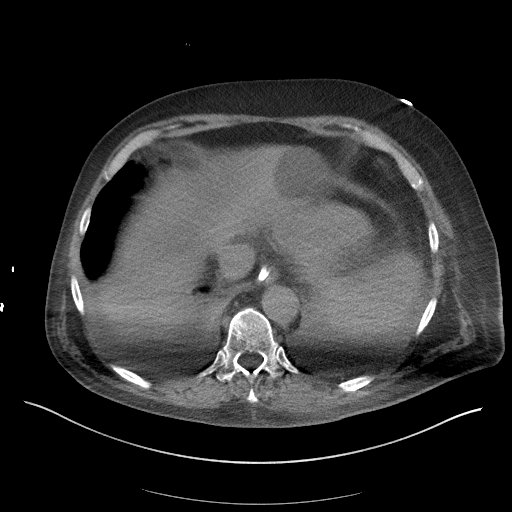
[im 60/66  soft-tissue]
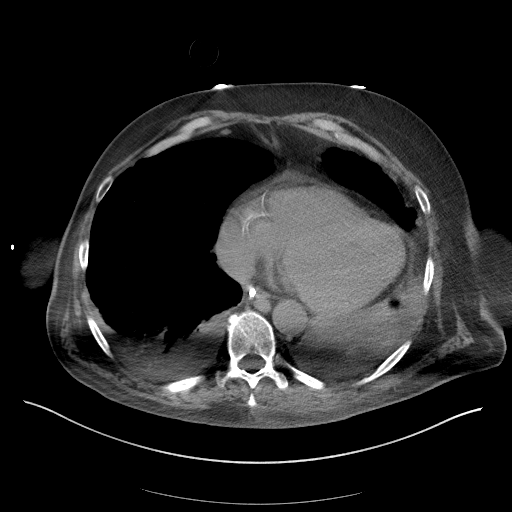

[Series 6: a/p w/o cor · coronal · non-contrast · 0.64mm/px · 3 of 160 slices shown]
[im 54/160  soft-tissue]
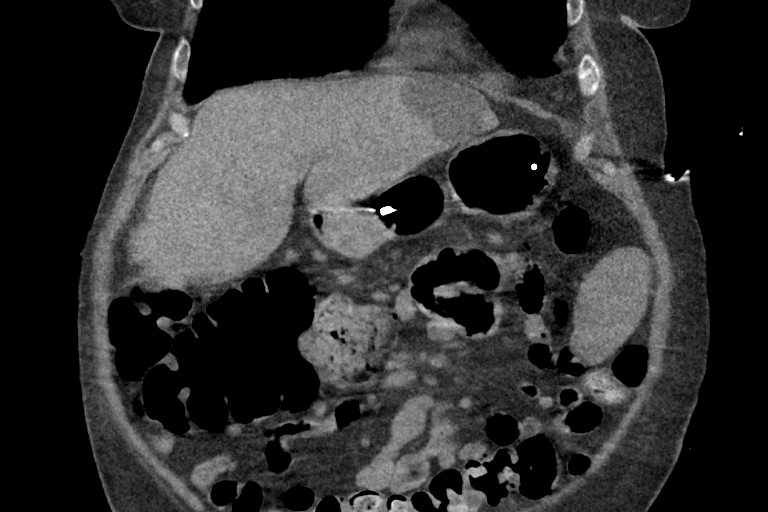
[im 71/160  soft-tissue]
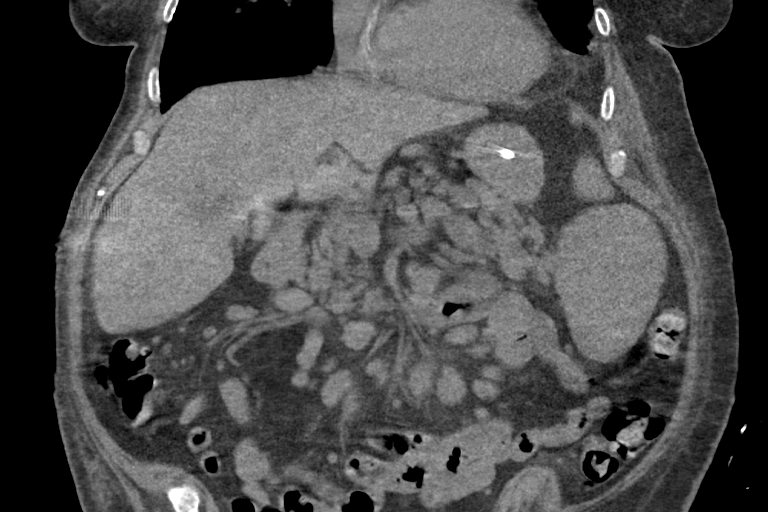
[im 89/160  soft-tissue]
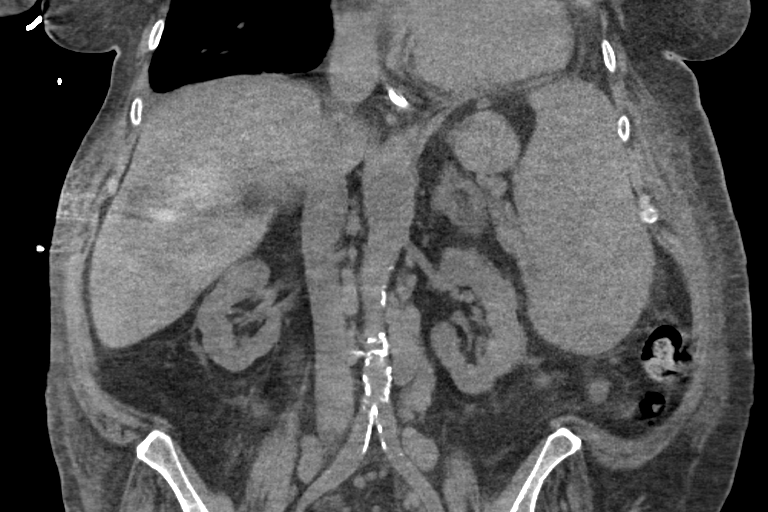

[14 of 46 positions shown; findings below may reference images not displayed]

FINDINGS: Lower chest: Limited visualization of the lower thorax demonstrates
trace bilateral effusions with associated bibasilar consolidative
opacities and associated air bronchograms, left greater than right.
Cardiomegaly. Trace amount of pericardial fluid, presumably
physiologic.

Hepatobiliary: Normal hepatic contour. There is an approximately
cm hypoattenuating lesion within the left lobe of the liver (axial
image 14, series 3), incompletely evaluated on this noncontrast
examination and while minimally increased in size compared to remote
examination performed in 3664 previously measuring 3.1 cm, favored
to represent a hepatic cyst. Normal noncontrast appearance of the
gallbladder given degree distention. No radiopaque gallstones. No
intra or extrahepatic biliary dilatation.

Pancreas: Normal noncontrast appearance of the pancreas.

Spleen: The spleen is enlarged measuring 17.3 cm in length (image
36, series 3).

Adrenals/Urinary Tract: Normal noncontrast appearance the bilateral
kidneys. No renal stones. There is a minimal amount of grossly
symmetric likely age and body habitus related perinephric stranding.
No urinary obstruction. Redemonstrated approximately 4.0 x 3.0 cm
macroscopic fat containing left-sided adrenal myelolipoma (image 26,
series 3, similar to chest CT performed [DATE]. Grossly unchanged
approximately 1.2 cm right-sided adrenal adenoma (image 21, series
3). The urinary bladder was not imaged.

Stomach/Bowel: The anterior wall the stomach is well apposed against
the ventral wall of the upper abdomen and would likely be improved
with gastric insufflation. Enteric tube tip terminates within the
gastric antrum. No significant hiatal hernia. Moderate colonic stool
burden without evidence of enteric obstruction. No pneumoperitoneum,
pneumatosis or portal venous gas.

Vascular/Lymphatic: Atherosclerotic plaque within a normal caliber
abdominal aorta.

Known retroperitoneal and mesenteric lymphadenopathy persists though
appears slightly improved within the upper abdomen compared to
previous examinations with index gastrohepatic ligament lymph node
now measuring 1.3 cm in greatest short axis diameter (image 21,
series 3), previously, 2.1 cm. Index left-sided periaortic lymph
node measures 1.7 cm in greatest short axis diameter (image 49,
series 3 and index left sided pelvic wall lymph node measures 1.9 cm
(image 66, series 3).

Other: Diffuse body wall anasarca.

Musculoskeletal: No acute or aggressive osseous abnormalities.
IMPRESSION: 1. Gastric anatomy amenable to attempted percutaneous gastrostomy
tube placement as indicated.
2. Redemonstrated though potentially improved abdominal adenopathy
compatible with known history of CLL.
3. Trace bilateral effusions with associated bibasilar consolidative
opacities and associated air bronchograms, left greater than right,
atelectasis versus infiltrate.
4. Cardiomegaly.
5. Aortic Atherosclerosis ([id number]).

## 2024-08-01 ENCOUNTER — Ambulatory Visit: Payer: Self-pay | Admitting: Internal Medicine

## 2024-08-01 NOTE — Progress Notes (Signed)
 There have been followup CT chest after this at [facility] with results as follows for August 2024 -. It has been 2 years since seen pulm clinic. Please do give us  an updae on your health status  Xxxx  COMPARISON:  CT chest, abdomen, and pelvis dated February 23, 2023.   FINDINGS:  CT CHEST FINDINGS   Cardiovascular: No significant vascular findings. Normal heart size.  No pericardial effusion. No thoracic aortic aneurysm or dissection.  Coronary, aortic arch, and branch vessel atherosclerotic vascular  disease. No central pulmonary embolism.   Mediastinum/Nodes: Prominent mediastinal and paraesophageal lymph  nodes are stable to slightly decreased in size compared to prior  study. No enlarged axillary or hilar lymph nodes. Thyroid  gland,  trachea, and esophagus demonstrate no significant findings.  Tracheostomy defect again noted.   Lungs/Pleura: Unchanged 8 mm endobronchial nodule at the bifurcation  of the right middle and lower lobe bronchi (series 3, image 82;  series 4, image 68). This was present on the prior study, but is new  since December 2023. Chronic mild diffuse peribronchial thickening.  Paraseptal and centrilobular emphysema, moderate in the left lung.  Unchanged small left pleural effusion. Posterior left upper and  lower lobe subsegmental atelectasis. No consolidation or  pneumothorax.
